# Patient Record
Sex: Male | Born: 1945 | Race: Black or African American | Hispanic: No | Smoking: Former smoker
Health system: Southern US, Community
[De-identification: ages and names within clinical notes are randomized; demographics above are authoritative.]

## PROBLEM LIST (undated history)

## (undated) ENCOUNTER — Emergency Department: Admission: EM | Payer: Medicare HMO | Source: Home / Self Care

## (undated) DIAGNOSIS — I509 Heart failure, unspecified: Secondary | ICD-10-CM

## (undated) DIAGNOSIS — J449 Chronic obstructive pulmonary disease, unspecified: Secondary | ICD-10-CM

## (undated) DIAGNOSIS — I2510 Atherosclerotic heart disease of native coronary artery without angina pectoris: Secondary | ICD-10-CM

## (undated) DIAGNOSIS — C801 Malignant (primary) neoplasm, unspecified: Secondary | ICD-10-CM

## (undated) DIAGNOSIS — C649 Malignant neoplasm of unspecified kidney, except renal pelvis: Secondary | ICD-10-CM

## (undated) DIAGNOSIS — C189 Malignant neoplasm of colon, unspecified: Secondary | ICD-10-CM

## (undated) DIAGNOSIS — J45909 Unspecified asthma, uncomplicated: Secondary | ICD-10-CM

## (undated) DIAGNOSIS — I428 Other cardiomyopathies: Secondary | ICD-10-CM

## (undated) DIAGNOSIS — I499 Cardiac arrhythmia, unspecified: Secondary | ICD-10-CM

## (undated) DIAGNOSIS — I4891 Unspecified atrial fibrillation: Secondary | ICD-10-CM

## (undated) DIAGNOSIS — I350 Nonrheumatic aortic (valve) stenosis: Secondary | ICD-10-CM

## (undated) DIAGNOSIS — Z8679 Personal history of other diseases of the circulatory system: Secondary | ICD-10-CM

## (undated) DIAGNOSIS — I10 Essential (primary) hypertension: Secondary | ICD-10-CM

## (undated) HISTORY — DX: Heart failure, unspecified: I50.9

## (undated) HISTORY — PX: KIDNEY SURGERY: SHX687

## (undated) HISTORY — DX: Cardiac arrhythmia, unspecified: I49.9

## (undated) HISTORY — PX: PARTIAL COLECTOMY: SHX5273

## (undated) HISTORY — PX: CARDIAC DEFIBRILLATOR PLACEMENT: SHX171

## (undated) HISTORY — PX: PACEMAKER IMPLANT: EP1218

---

## 2008-11-13 ENCOUNTER — Inpatient Hospital Stay: Payer: Self-pay | Admitting: Internal Medicine

## 2010-08-21 ENCOUNTER — Inpatient Hospital Stay: Payer: Self-pay | Admitting: Specialist

## 2010-11-10 ENCOUNTER — Emergency Department: Payer: Self-pay | Admitting: Emergency Medicine

## 2012-01-04 ENCOUNTER — Inpatient Hospital Stay: Payer: Self-pay | Admitting: *Deleted

## 2012-01-04 LAB — URINALYSIS, COMPLETE
Bacteria: NONE SEEN
Bilirubin,UR: NEGATIVE
Blood: NEGATIVE
Glucose,UR: NEGATIVE mg/dL (ref 0–75)
Granular Cast: 4
Nitrite: NEGATIVE
Protein: NEGATIVE
RBC,UR: 4 /HPF (ref 0–5)
Squamous Epithelial: 1
WBC UR: 5 /HPF (ref 0–5)

## 2012-01-04 LAB — CBC WITH DIFFERENTIAL/PLATELET
Basophil #: 0 10*3/uL (ref 0.0–0.1)
Basophil %: 0.4 %
Eosinophil #: 0.2 10*3/uL (ref 0.0–0.7)
HCT: 21.5 % — ABNORMAL LOW (ref 40.0–52.0)
HGB: 6.8 g/dL — ABNORMAL LOW (ref 13.0–18.0)
MCH: 27.3 pg (ref 26.0–34.0)
MCHC: 31.6 g/dL — ABNORMAL LOW (ref 32.0–36.0)
MCV: 86 fL (ref 80–100)
Monocyte #: 0.9 x10 3/mm (ref 0.2–1.0)
Monocyte %: 12.6 %
Neutrophil %: 62.9 %
Platelet: 199 10*3/uL (ref 150–440)
RDW: 19.2 % — ABNORMAL HIGH (ref 11.5–14.5)
WBC: 7.5 10*3/uL (ref 3.8–10.6)

## 2012-01-04 LAB — COMPREHENSIVE METABOLIC PANEL
Albumin: 2.8 g/dL — ABNORMAL LOW (ref 3.4–5.0)
Alkaline Phosphatase: 46 U/L — ABNORMAL LOW (ref 50–136)
Anion Gap: 9 (ref 7–16)
Bilirubin,Total: 0.2 mg/dL (ref 0.2–1.0)
Calcium, Total: 7.9 mg/dL — ABNORMAL LOW (ref 8.5–10.1)
Chloride: 110 mmol/L — ABNORMAL HIGH (ref 98–107)
Creatinine: 1.64 mg/dL — ABNORMAL HIGH (ref 0.60–1.30)
EGFR (African American): 49 — ABNORMAL LOW
EGFR (Non-African Amer.): 43 — ABNORMAL LOW
Osmolality: 298 (ref 275–301)
Potassium: 3.8 mmol/L (ref 3.5–5.1)
SGOT(AST): 26 U/L (ref 15–37)
SGPT (ALT): 23 U/L

## 2012-01-04 LAB — CK TOTAL AND CKMB (NOT AT ARMC)
CK, Total: 53 U/L (ref 35–232)
CK-MB: 1.1 ng/mL (ref 0.5–3.6)

## 2012-01-04 LAB — TROPONIN I: Troponin-I: 0.12 ng/mL — ABNORMAL HIGH

## 2012-01-04 LAB — LIPASE, BLOOD: Lipase: 53 U/L — ABNORMAL LOW (ref 73–393)

## 2012-01-05 LAB — CK TOTAL AND CKMB (NOT AT ARMC)
CK, Total: 255 U/L — ABNORMAL HIGH (ref 35–232)
CK-MB: 2.6 ng/mL (ref 0.5–3.6)

## 2012-01-05 LAB — BASIC METABOLIC PANEL
Anion Gap: 14 (ref 7–16)
BUN: 58 mg/dL — ABNORMAL HIGH (ref 7–18)
Calcium, Total: 8 mg/dL — ABNORMAL LOW (ref 8.5–10.1)
Co2: 22 mmol/L (ref 21–32)
Creatinine: 1.74 mg/dL — ABNORMAL HIGH (ref 0.60–1.30)
EGFR (African American): 46 — ABNORMAL LOW
Glucose: 116 mg/dL — ABNORMAL HIGH (ref 65–99)
Sodium: 146 mmol/L — ABNORMAL HIGH (ref 136–145)

## 2012-01-05 LAB — CBC WITH DIFFERENTIAL/PLATELET
Basophil #: 0 10*3/uL (ref 0.0–0.1)
Basophil %: 0.3 %
Eosinophil #: 0 10*3/uL (ref 0.0–0.7)
HCT: 22.7 % — ABNORMAL LOW (ref 40.0–52.0)
MCH: 26.6 pg (ref 26.0–34.0)
MCHC: 31.9 g/dL — ABNORMAL LOW (ref 32.0–36.0)
MCV: 83 fL (ref 80–100)
Monocyte %: 6.1 %
Neutrophil %: 83.6 %
RBC: 2.72 10*6/uL — ABNORMAL LOW (ref 4.40–5.90)
RDW: 17.8 % — ABNORMAL HIGH (ref 11.5–14.5)

## 2012-01-05 LAB — HEMOGLOBIN: HGB: 7.6 g/dL — ABNORMAL LOW (ref 13.0–18.0)

## 2012-01-05 LAB — LIPID PANEL
Cholesterol: 94 mg/dL (ref 0–200)
Ldl Cholesterol, Calc: 41 mg/dL (ref 0–100)
VLDL Cholesterol, Calc: 30 mg/dL (ref 5–40)

## 2012-01-05 LAB — TROPONIN I: Troponin-I: 0.12 ng/mL — ABNORMAL HIGH

## 2012-01-05 LAB — PROTIME-INR
INR: 2.3
Prothrombin Time: 39.6 secs — ABNORMAL HIGH (ref 11.5–14.7)

## 2012-01-05 LAB — HEMOGLOBIN A1C: Hemoglobin A1C: 5.9 % (ref 4.2–6.3)

## 2012-01-06 LAB — BASIC METABOLIC PANEL
Anion Gap: 10 (ref 7–16)
Calcium, Total: 7.8 mg/dL — ABNORMAL LOW (ref 8.5–10.1)
Chloride: 112 mmol/L — ABNORMAL HIGH (ref 98–107)
Co2: 25 mmol/L (ref 21–32)
Creatinine: 1.54 mg/dL — ABNORMAL HIGH (ref 0.60–1.30)
EGFR (Non-African Amer.): 46 — ABNORMAL LOW
Osmolality: 305 (ref 275–301)
Potassium: 4.1 mmol/L (ref 3.5–5.1)
Sodium: 147 mmol/L — ABNORMAL HIGH (ref 136–145)

## 2012-01-06 LAB — CBC WITH DIFFERENTIAL/PLATELET
Basophil %: 0.4 %
HCT: 24.2 % — ABNORMAL LOW (ref 40.0–52.0)
HGB: 7.8 g/dL — ABNORMAL LOW (ref 13.0–18.0)
Lymphocyte #: 1.4 10*3/uL (ref 1.0–3.6)
MCH: 27.6 pg (ref 26.0–34.0)
MCV: 85 fL (ref 80–100)
Monocyte #: 0.9 x10 3/mm (ref 0.2–1.0)
Monocyte %: 10.2 %
Neutrophil #: 6.4 10*3/uL (ref 1.4–6.5)
Neutrophil %: 72.6 %
RBC: 2.84 10*6/uL — ABNORMAL LOW (ref 4.40–5.90)
RDW: 16.6 % — ABNORMAL HIGH (ref 11.5–14.5)

## 2012-01-06 LAB — PROTIME-INR
INR: 4.2
Prothrombin Time: 39 secs — ABNORMAL HIGH (ref 11.5–14.7)

## 2012-01-07 LAB — PROTIME-INR
INR: 2.5
Prothrombin Time: 26.9 secs — ABNORMAL HIGH (ref 11.5–14.7)

## 2012-01-07 LAB — BASIC METABOLIC PANEL
BUN: 30 mg/dL — ABNORMAL HIGH (ref 7–18)
Co2: 27 mmol/L (ref 21–32)
Creatinine: 1.33 mg/dL — ABNORMAL HIGH (ref 0.60–1.30)
EGFR (African American): >60
Glucose: 114 mg/dL — ABNORMAL HIGH (ref 65–99)
Potassium: 3.6 mmol/L (ref 3.5–5.1)

## 2012-01-07 LAB — HEMOGLOBIN: HGB: 7.8 g/dL — ABNORMAL LOW (ref 13.0–18.0)

## 2012-01-08 LAB — CBC WITH DIFFERENTIAL/PLATELET
Basophil #: 0 10*3/uL (ref 0.0–0.1)
Basophil %: 0.4 %
Eosinophil #: 0.3 10*3/uL (ref 0.0–0.7)
Eosinophil %: 2.8 %
Lymphocyte %: 8.6 %
MCH: 28.3 pg (ref 26.0–34.0)
MCHC: 32.9 g/dL (ref 32.0–36.0)
Monocyte #: 1 x10 3/mm (ref 0.2–1.0)
Neutrophil #: 7.5 10*3/uL — ABNORMAL HIGH (ref 1.4–6.5)
Neutrophil %: 77.9 %
Platelet: 167 10*3/uL (ref 150–440)
RBC: 3.01 10*6/uL — ABNORMAL LOW (ref 4.40–5.90)
WBC: 9.7 10*3/uL (ref 3.8–10.6)

## 2012-01-08 LAB — PROTIME-INR: Prothrombin Time: 21.1 secs — ABNORMAL HIGH (ref 11.5–14.7)

## 2012-01-08 LAB — HEMOGLOBIN: HGB: 8.5 g/dL — ABNORMAL LOW (ref 13.0–18.0)

## 2012-01-09 LAB — BASIC METABOLIC PANEL
Anion Gap: 8 (ref 7–16)
Calcium, Total: 8.5 mg/dL (ref 8.5–10.1)
Chloride: 104 mmol/L (ref 98–107)
Co2: 29 mmol/L (ref 21–32)
Creatinine: 0.99 mg/dL (ref 0.60–1.30)
EGFR (African American): >60
EGFR (Non-African Amer.): >60
Glucose: 82 mg/dL (ref 65–99)
Osmolality: 279 (ref 275–301)
Sodium: 141 mmol/L (ref 136–145)

## 2012-01-09 LAB — HEMOGLOBIN: HGB: 8.3 g/dL — ABNORMAL LOW (ref 13.0–18.0)

## 2012-07-24 ENCOUNTER — Inpatient Hospital Stay: Payer: Self-pay | Admitting: Internal Medicine

## 2012-07-24 LAB — COMPREHENSIVE METABOLIC PANEL
Albumin: 3.3 g/dL — ABNORMAL LOW (ref 3.4–5.0)
Alkaline Phosphatase: 74 U/L (ref 50–136)
Anion Gap: 8 (ref 7–16)
BUN: 21 mg/dL — ABNORMAL HIGH (ref 7–18)
Calcium, Total: 8.4 mg/dL — ABNORMAL LOW (ref 8.5–10.1)
Chloride: 107 mmol/L (ref 98–107)
Creatinine: 1.11 mg/dL (ref 0.60–1.30)
EGFR (African American): >60
Glucose: 121 mg/dL — ABNORMAL HIGH (ref 65–99)
Osmolality: 291 (ref 275–301)
Potassium: 3.3 mmol/L — ABNORMAL LOW (ref 3.5–5.1)
SGOT(AST): 22 U/L (ref 15–37)
Sodium: 144 mmol/L (ref 136–145)
Total Protein: 7.5 g/dL (ref 6.4–8.2)

## 2012-07-24 LAB — CBC
HCT: 35.3 % — ABNORMAL LOW (ref 40.0–52.0)
HGB: 12.2 g/dL — ABNORMAL LOW (ref 13.0–18.0)
MCH: 30.7 pg (ref 26.0–34.0)
MCHC: 34.5 g/dL (ref 32.0–36.0)
MCV: 89 fL (ref 80–100)
Platelet: 201 10*3/uL (ref 150–440)
RBC: 3.98 10*6/uL — ABNORMAL LOW (ref 4.40–5.90)

## 2012-07-24 LAB — CK TOTAL AND CKMB (NOT AT ARMC): CK-MB: 1.9 ng/mL (ref 0.5–3.6)

## 2012-07-25 LAB — LIPID PANEL
HDL Cholesterol: 48 mg/dL (ref 40–60)
Ldl Cholesterol, Calc: 50 mg/dL (ref 0–100)
Triglycerides: 114 mg/dL (ref 0–200)
VLDL Cholesterol, Calc: 23 mg/dL (ref 5–40)

## 2012-07-25 LAB — CBC WITH DIFFERENTIAL/PLATELET
Basophil %: 0.9 %
Eosinophil #: 0.2 10*3/uL (ref 0.0–0.7)
Eosinophil %: 2.2 %
HCT: 34.3 % — ABNORMAL LOW (ref 40.0–52.0)
HGB: 11.5 g/dL — ABNORMAL LOW (ref 13.0–18.0)
Lymphocyte #: 1.8 10*3/uL (ref 1.0–3.6)
MCH: 29.9 pg (ref 26.0–34.0)
MCHC: 33.6 g/dL (ref 32.0–36.0)
Monocyte #: 0.8 x10 3/mm (ref 0.2–1.0)
Neutrophil #: 4.7 10*3/uL (ref 1.4–6.5)
Neutrophil %: 63.1 %
RBC: 3.85 10*6/uL — ABNORMAL LOW (ref 4.40–5.90)
RDW: 14.9 % — ABNORMAL HIGH (ref 11.5–14.5)

## 2012-07-25 LAB — BASIC METABOLIC PANEL
Calcium, Total: 8.5 mg/dL (ref 8.5–10.1)
Chloride: 101 mmol/L (ref 98–107)
Co2: 32 mmol/L (ref 21–32)
EGFR (African American): >60
EGFR (Non-African Amer.): 59 — ABNORMAL LOW
Glucose: 140 mg/dL — ABNORMAL HIGH (ref 65–99)
Potassium: 3.2 mmol/L — ABNORMAL LOW (ref 3.5–5.1)
Sodium: 141 mmol/L (ref 136–145)

## 2012-07-25 LAB — TROPONIN I: Troponin-I: 0.27 ng/mL — ABNORMAL HIGH

## 2013-01-19 ENCOUNTER — Inpatient Hospital Stay: Payer: Self-pay | Admitting: Internal Medicine

## 2013-01-19 LAB — COMPREHENSIVE METABOLIC PANEL
BUN: 20 mg/dL — ABNORMAL HIGH (ref 7–18)
Bilirubin,Total: 0.4 mg/dL (ref 0.2–1.0)
Chloride: 106 mmol/L (ref 98–107)
Creatinine: 1.36 mg/dL — ABNORMAL HIGH (ref 0.60–1.30)
EGFR (African American): >60
EGFR (Non-African Amer.): 53 — ABNORMAL LOW
Glucose: 93 mg/dL (ref 65–99)
Potassium: 3.7 mmol/L (ref 3.5–5.1)
SGOT(AST): 19 U/L (ref 15–37)
SGPT (ALT): 19 U/L (ref 12–78)
Sodium: 142 mmol/L (ref 136–145)

## 2013-01-19 LAB — APTT: Activated PTT: 28.7 secs (ref 23.6–35.9)

## 2013-01-19 LAB — CBC
HCT: 33 % — ABNORMAL LOW (ref 40.0–52.0)
HGB: 11.2 g/dL — ABNORMAL LOW (ref 13.0–18.0)
MCH: 30.1 pg (ref 26.0–34.0)
MCHC: 33.9 g/dL (ref 32.0–36.0)
RBC: 3.72 10*6/uL — ABNORMAL LOW (ref 4.40–5.90)
RDW: 14.3 % (ref 11.5–14.5)
WBC: 6.1 10*3/uL (ref 3.8–10.6)

## 2013-01-19 LAB — URINALYSIS, COMPLETE
Blood: NEGATIVE
Glucose,UR: NEGATIVE mg/dL (ref 0–75)
Ketone: NEGATIVE
Nitrite: NEGATIVE
Ph: 6 (ref 4.5–8.0)
Protein: 25
RBC,UR: 1 /HPF (ref 0–5)
WBC UR: 9 /HPF (ref 0–5)

## 2013-01-19 LAB — PRO B NATRIURETIC PEPTIDE: B-Type Natriuretic Peptide: 660 pg/mL — ABNORMAL HIGH (ref 0–125)

## 2013-01-19 LAB — CK TOTAL AND CKMB (NOT AT ARMC): CK, Total: 139 U/L (ref 35–232)

## 2013-01-20 LAB — CK TOTAL AND CKMB (NOT AT ARMC)
CK, Total: 125 U/L (ref 35–232)
CK-MB: 2.3 ng/mL (ref 0.5–3.6)

## 2013-01-20 LAB — TROPONIN I: Troponin-I: 0.23 ng/mL — ABNORMAL HIGH

## 2013-01-20 LAB — APTT
Activated PTT: 55.8 secs — ABNORMAL HIGH (ref 23.6–35.9)
Activated PTT: 66.8 secs — ABNORMAL HIGH (ref 23.6–35.9)
Activated PTT: 33 secs (ref 23.6–35.9)

## 2013-04-15 ENCOUNTER — Observation Stay: Payer: Self-pay | Admitting: Student

## 2013-04-15 LAB — CBC
MCH: 30.5 pg (ref 26.0–34.0)
MCHC: 33.9 g/dL (ref 32.0–36.0)
Platelet: 160 10*3/uL (ref 150–440)
RBC: 3.65 10*6/uL — ABNORMAL LOW (ref 4.40–5.90)
RDW: 14 % (ref 11.5–14.5)
WBC: 6.8 10*3/uL (ref 3.8–10.6)

## 2013-04-15 LAB — BASIC METABOLIC PANEL
Anion Gap: 7 (ref 7–16)
BUN: 15 mg/dL (ref 7–18)
Co2: 30 mmol/L (ref 21–32)
EGFR (African American): >60
Glucose: 135 mg/dL — ABNORMAL HIGH (ref 65–99)
Osmolality: 288 (ref 275–301)
Potassium: 3.6 mmol/L (ref 3.5–5.1)

## 2013-04-15 LAB — TROPONIN I
Troponin-I: 0.16 ng/mL — ABNORMAL HIGH
Troponin-I: 0.15 ng/mL — ABNORMAL HIGH
Troponin-I: 0.14 ng/mL — ABNORMAL HIGH

## 2013-04-15 LAB — MAGNESIUM: Magnesium: 1.6 mg/dL — ABNORMAL LOW

## 2013-04-15 LAB — CK TOTAL AND CKMB (NOT AT ARMC)
CK, Total: 118 U/L (ref 35–232)
CK, Total: 104 U/L (ref 35–232)
CK, Total: 105 U/L (ref 35–232)
CK-MB: 1.8 ng/mL (ref 0.5–3.6)
CK-MB: 1.8 ng/mL (ref 0.5–3.6)

## 2013-04-15 LAB — PROTIME-INR: Prothrombin Time: 16.7 secs — ABNORMAL HIGH (ref 11.5–14.7)

## 2013-04-16 LAB — CBC WITH DIFFERENTIAL/PLATELET
Basophil #: 0 10*3/uL (ref 0.0–0.1)
Basophil %: 0.6 %
Eosinophil #: 0.2 10*3/uL (ref 0.0–0.7)
Eosinophil %: 3.3 %
Lymphocyte #: 1.2 10*3/uL (ref 1.0–3.6)
Lymphocyte %: 20.3 %
MCH: 31.4 pg (ref 26.0–34.0)
MCHC: 35.1 g/dL (ref 32.0–36.0)
Monocyte %: 12.4 %
Neutrophil %: 63.4 %
RDW: 14.2 % (ref 11.5–14.5)

## 2013-04-16 LAB — BASIC METABOLIC PANEL
Anion Gap: 4 — ABNORMAL LOW (ref 7–16)
BUN: 15 mg/dL (ref 7–18)
Calcium, Total: 8.9 mg/dL (ref 8.5–10.1)
Chloride: 104 mmol/L (ref 98–107)
Co2: 31 mmol/L (ref 21–32)
EGFR (Non-African Amer.): >60
Glucose: 85 mg/dL (ref 65–99)
Osmolality: 278 (ref 275–301)
Sodium: 139 mmol/L (ref 136–145)

## 2013-04-16 LAB — PROTIME-INR
INR: 1.4
Prothrombin Time: 17.4 secs — ABNORMAL HIGH (ref 11.5–14.7)

## 2013-04-16 LAB — MAGNESIUM: Magnesium: 1.7 mg/dL — ABNORMAL LOW

## 2014-04-28 ENCOUNTER — Emergency Department: Payer: Self-pay | Admitting: Emergency Medicine

## 2014-05-24 ENCOUNTER — Emergency Department: Payer: Self-pay | Admitting: Emergency Medicine

## 2014-06-27 ENCOUNTER — Ambulatory Visit: Payer: Self-pay | Admitting: Family Medicine

## 2014-10-19 ENCOUNTER — Inpatient Hospital Stay: Payer: Self-pay | Admitting: Internal Medicine

## 2014-10-19 LAB — CK-MB
CK-MB: 1.4 ng/mL (ref 0.5–3.6)
CK-MB: 1.4 ng/mL (ref 0.5–3.6)
CK-MB: 1.5 ng/mL (ref 0.5–3.6)

## 2014-10-19 LAB — CBC
HCT: 34.2 % — ABNORMAL LOW (ref 40.0–52.0)
HGB: 11.1 g/dL — ABNORMAL LOW (ref 13.0–18.0)
MCH: 29.9 pg (ref 26.0–34.0)
MCHC: 32.5 g/dL (ref 32.0–36.0)
MCV: 92 fL (ref 80–100)
PLATELETS: 210 10*3/uL (ref 150–440)
RBC: 3.72 10*6/uL — ABNORMAL LOW (ref 4.40–5.90)
RDW: 15.6 % — ABNORMAL HIGH (ref 11.5–14.5)
WBC: 10.2 10*3/uL (ref 3.8–10.6)

## 2014-10-19 LAB — BASIC METABOLIC PANEL
ANION GAP: 6 — AB (ref 7–16)
BUN: 16 mg/dL (ref 7–18)
CHLORIDE: 108 mmol/L — AB (ref 98–107)
Calcium, Total: 8.8 mg/dL (ref 8.5–10.1)
Co2: 33 mmol/L — ABNORMAL HIGH (ref 21–32)
Creatinine: 1.15 mg/dL (ref 0.60–1.30)
EGFR (African American): >60
EGFR (Non-African Amer.): >60
Glucose: 92 mg/dL (ref 65–99)
Osmolality: 293 (ref 275–301)
POTASSIUM: 3.4 mmol/L — AB (ref 3.5–5.1)
Sodium: 147 mmol/L — ABNORMAL HIGH (ref 136–145)

## 2014-10-19 LAB — APTT: Activated PTT: 52.6 secs — ABNORMAL HIGH (ref 23.6–35.9)

## 2014-10-19 LAB — PROTIME-INR
INR: 3.5
Prothrombin Time: 33.9 secs — ABNORMAL HIGH (ref 11.5–14.7)

## 2014-10-19 LAB — TROPONIN I
TROPONIN-I: 0.39 ng/mL — AB
TROPONIN-I: 0.38 ng/mL — AB
TROPONIN-I: 0.41 ng/mL — AB

## 2014-10-19 LAB — PRO B NATRIURETIC PEPTIDE: B-Type Natriuretic Peptide: 3218 pg/mL — ABNORMAL HIGH (ref 0–125)

## 2014-10-20 LAB — CBC WITH DIFFERENTIAL/PLATELET
BASOS ABS: 0.1 10*3/uL (ref 0.0–0.1)
Basophil %: 0.5 %
EOS ABS: 0 10*3/uL (ref 0.0–0.7)
Eosinophil %: 0 %
HCT: 34.8 % — AB (ref 40.0–52.0)
HGB: 11 g/dL — ABNORMAL LOW (ref 13.0–18.0)
LYMPHS ABS: 0.6 10*3/uL — AB (ref 1.0–3.6)
Lymphocyte %: 5.6 %
MCH: 29.3 pg (ref 26.0–34.0)
MCHC: 31.8 g/dL — ABNORMAL LOW (ref 32.0–36.0)
MCV: 92 fL (ref 80–100)
MONO ABS: 0.1 x10 3/mm — AB (ref 0.2–1.0)
MONOS PCT: 0.9 %
NEUTROS ABS: 9.3 10*3/uL — AB (ref 1.4–6.5)
NEUTROS PCT: 93 %
Platelet: 216 10*3/uL (ref 150–440)
RBC: 3.78 10*6/uL — ABNORMAL LOW (ref 4.40–5.90)
RDW: 15.4 % — AB (ref 11.5–14.5)
WBC: 10 10*3/uL (ref 3.8–10.6)

## 2014-10-20 LAB — PROTIME-INR
INR: 3.2
PROTHROMBIN TIME: 31.5 s — AB (ref 11.5–14.7)

## 2014-10-20 LAB — BASIC METABOLIC PANEL
Anion Gap: 7 (ref 7–16)
BUN: 20 mg/dL — AB (ref 7–18)
Calcium, Total: 9.2 mg/dL (ref 8.5–10.1)
Chloride: 103 mmol/L (ref 98–107)
Co2: 33 mmol/L — ABNORMAL HIGH (ref 21–32)
Creatinine: 1.07 mg/dL (ref 0.60–1.30)
EGFR (African American): >60
GLUCOSE: 136 mg/dL — AB (ref 65–99)
Osmolality: 290 (ref 275–301)
POTASSIUM: 3.6 mmol/L (ref 3.5–5.1)
SODIUM: 143 mmol/L (ref 136–145)

## 2014-10-21 LAB — PROTIME-INR
INR: 3.2
PROTHROMBIN TIME: 31.8 s — AB (ref 11.5–14.7)

## 2015-01-11 NOTE — Consult Note (Signed)
Brief Consult Note: Diagnosis: NSTEMI in patient with known CAD s/p PCI and stening, cardiomyopathy with AICD.   Comments: Patient has h/o CHF (systolic and diastolic dysfx) with cardiomyopathy s/p AICD, HTN, hyperlipidemia, CAD s/p PCI, h/o prior MI, paroxysmal afib and had episode of CP, SOB, diaphoresis, and pre-syncope with TNI 0.29. Heparin gtt not started due to h/o GIB and on ASA, Plavix, statin, BB, Lasix. Will plan for  cardiac cath today and will need to get records from [facility]ologist at [facility].  Electronic Signatures: Angelica Ran (MD)   (Signed [phone] 08:15)  Co-Signer: Brief Consult Note Gabriel Rung A (PA-C)   (Signed 31-Oct-13 15:19)  Authored: Brief Consult Note  Last Updated: 01-Nov-13 08:15 by Angelica Ran (MD)

## 2015-01-11 NOTE — Discharge Summary (Signed)
PATIENT NAME:  Darius Taylor, Darius Taylor MR#:  [id number] DATE OF BIRTH:  09/10/1946  DATE OF ADMISSION:  07/24/2012 DATE OF DISCHARGE:  07/25/2012  PRIMARY CARE PHYSICIAN:   Nonlocal. CONSULTING PHYSICIAN: Dr. Humphrey Rolls.   DISCHARGE DIAGNOSES:  1. Unstable angina.  2. Chronic congestive heart failure with diastolic dysfunction.  3. Hypertension.   PROCEDURE: Cardiac catheterization.    CONDITION: Stable.   CODE STATUS: FULL CODE.  MEDICATIONS:  1. Lasix 20 mg p.o. daily.  2. Lopressor 50 mg p.o. 1.5 tablets b.i.d.  3. Aspirin 81 mg p.o. daily.  4. Magnesium oxide 400 mg p.o. b.i.d.  5. Atorvastatin 40 mg p.o. at bedtime.  6. Proventil HFA 90 mcg inhalation 2 puffs inhaled 4 to 6 hours p.r.n.  7. Symbicort 80 mcg/4.5 mcg inhalation 2 puffs b.i.d.  8. Ferrous sulfate 45 mg 1 tablet p.o. daily.  9. Sevoflurane 20 mg p.o. daily.  10. Amiodarone 400 mg p.o. 0.5 tablets once daily. 11. Vitamin D3 1000 international units p.o. capsule one cap daily.  12. Lisinopril 10 mg p.o. 2 tablets once daily.  13. Albuterol 2.5 mg/3 mL inhalation, 3 mL inhaled every 4 to 6 hours p.r.n. for dyspnea.  14. Nitrostat 0.4 mg sublingual tablets 1 tablet every five minutes p.r.n. for chest pain.  15. New medication:  Plavix 75 mg p.o. daily.   DIET: Low sodium diet.   ACTIVITY: As tolerated.   FOLLOW-UP CARE: Follow-up with Dr. Humphrey Rolls this Monday and follow-up with PCP within 1 to 2 weeks.   REASON FOR ADMISSION: Chest pain.   HOSPITAL COURSE: The patient is a 69 year old Caucasian male with a history of hypertension, hyperlipidemia, cardiomyopathy, congestive heart failure with diastolic dysfunction, atrial fib status post defibrillator, but off Coumadin due to gastrointestinal bleeding, who presented to the ED with chest pain, which is in the substernal area, 4 out of 10, intermittent, no radiation. For detailed history and physical examination, please refer to the admission note dictated by me. On admission date,  the patient's troponin level was at 0.29. WBC 8.7, hemoglobin 12.2, potassium 3.1. The patient's EKG showed atrial fib with competing junctional pacemaker, prolonged QT. After admission, the patient has been treated with aspirin 325 mg p.o. daily. Plavix was added. In addition, we gave statin, lisinopril, Lopressor and Lasix. The patient had previous gastrointestinal bleeding in April. We did not take start heparin drip or Lovenox. The patient's troponin level has been stable around 0.29, 0.27. We requested a cardiology consult. Dr. Humphrey Rolls evaluated the patient and did a cardiac cath this morning which revealed stent in the mid LAD having mild restenosis. Dr. Humphrey Rolls suggested aggressive medical therapy as LAD looks okay and LVEF was normal. He suggested the patient follow-up him on Monday in his office. The patient has no complaints after cardiac catheterization. Vital signs are stable. He was discharged to home today. I discussed the patient's discharge plan with the patient, Dr. Humphrey Rolls and the case manager.   TIME SPENT: About 36 minutes.   ____________________________ Demetrios Loll, MD qc:ap D: 07/25/2012 16:15:57 ET T: 07/26/2012 11:36:43 ET JOB#: [id number]  cc: Demetrios Loll, MD, <Dictator> Dionisio David, MD Demetrios Loll MD ELECTRONICALLY SIGNED 07/30/2012 16:55

## 2015-01-11 NOTE — H&P (Signed)
PATIENT NAME:  Darius Taylor, VAIL MR#:  [id number] DATE OF BIRTH:  10-11-1945  DATE OF ADMISSION:  07/24/2012  PRIMARY CARE PHYSICIAN:  None local.  REFERRING PHYSICIAN: Ferman Hamming, MD  CHIEF COMPLAINT: Chest pain today.   HISTORY OF PRESENT ILLNESS: The patient is a 69 year old Caucasian male with history of hypertension, hyperlipidemia, cardiomyopathy, congestive heart failure with diastolic dysfunction, and atrial fibrillation status post defibrillator but off Coumadin due to GI bleeding who presented to the ED with chest pain today. The patient started to have chest pain at about 9:00 p.m. when he went to the bathroom, which was substernal, 4 out of 10, intermittent and aching with no radiation. The patient also complains of cough, sputum, shortness of breath, and palpitations. He uses two pillows to sleep as he has history of CHF, but he denies any weight gain or leg edema, no orthopnea, and no nocturnal dyspnea. The patient denies any fever or chills and no dysuria or hematuria. He mentioned that he had pneumonia recently.   PAST MEDICAL HISTORY:  1. Hypertension. 2. Hyperlipidemia. 3. Chronic kidney disease. 4. Congestive heart failure, diastolic dysfunction. 5. Cardiomyopathy.  6. Atrial fibrillation.  7. Renal carcinoma status post left nephrectomy.  8. Gout.  9. Chronic kidney disease stage III.   PAST SURGICAL HISTORY:  1. Left nephrectomy for renal carcinoma.  2. Defibrillator placement.   SOCIAL HISTORY: No smoking or drinking or illicit drugs.   FAMILY HISTORY: Mother with aneurysm.   ALLERGIES: No known drug allergies.   HOME MEDICATIONS:  1. Albuterol 2.5/3 mL every 4 to 6 hours p.r.n. 2. Amiodarone 400 mg tablet 1/2 tablet once daily.  3. Aspirin 81 mg p.o. daily.  4. Atorvastatin 1 tab at bedtime.  5. Citalopram 20 mg p.o. daily.  6. Ferrous sulfate (as elemental iron) 45 mg tablets 1 tablet a day.  7. Lasix 20 mg p.o. daily.  8. Lisinopril 10 mg p.o. 2  tablets p.o. daily.  9. Magnesium oxide 400 mg p.o. twice a day. 10. Lopressor 50 mg p.o. 1-1/2 tablets twice a day.  11. Nitrostat 0.4 mg sublingual every five minutes p.r.n. for chest pain.  12. Proventil HFA 90 mcg inhalation 2 puffs every 4 to 6 hours p.r.n.  13. Symbicort 80 mcg/4.5 mcg inhalation 2 tabs twice a day.  14. Vitamin D3 1000 international unit capsule one cap once daily.   REVIEW OF SYSTEMS: CONSTITUTIONAL: The patient denies any fever or chills. No headache or dizziness. No weakness. EYES: No double vision or blurred vision. ENT: No postnasal drip, slurred speech, or dysphagia. No epistaxis. CARDIOVASCULAR: Positive for chest pain and palpitations but no orthopnea, nocturnal dyspnea, or leg edema. PULMONARY: Positive for cough, sputum, shortness of breath, or hemoptysis. GASTROINTESTINAL: No abdominal pain, nausea, vomiting, or diarrhea. No melena or bloody stool. GENITOURINARY: No dysuria, hematuria, or incontinence. ENDOCRINE: No heat or cold intolerance. No polyuria or polydipsia. SKIN: No rash or jaundice. MUSCULOSKELETAL: No joint pain or edema. NEUROLOGY: No syncope, loss of consciousness, or seizure. HEMATOLOGY: No easy bruising or bleeding.   PHYSICAL EXAMINATION:   VITAL SIGNS: Temperature 99.1, blood pressure 108/73, pulse 62, respirations 18, and oxygen saturation 97% on room air.   GENERAL: The patient is alert, awake, and oriented, in no acute distress.   HEENT: Pupils are round, equal, and reactive to light and accommodation. Moist oral mucosa. Clear oropharynx.   NECK: Supple. No JVD or carotid bruits. No lymphadenopathy. No thyromegaly.   CARDIOVASCULAR: S1 and S2 regular rate  and rhythm. No murmurs or gallops.   PULMONARY: Bilateral air entry. No wheezing or rales. No use of accessory muscles to breathe.   ABDOMEN: Soft, obese. No distention or tenderness. No organomegaly. Bowel sounds present.   EXTREMITIES: No edema, clubbing, or cyanosis. No calf  tenderness. Strong bilateral pedal pulses.   SKIN: No rash or jaundice.   NEURO: Alert and oriented x3. No focal deficit. Power 5 out of 5. Sensation intact. Deep tendon reflexes 2+.   LABORATORY, DIAGNOSTIC AND RADIOLOGIC DATA: WBC 8.7, hemoglobin 12.2, and platelets 201. Troponin 0.29. CK 49 and CK-MB 1.9. Glucose 121, BUN 21, creatinine 1.11, sodium 144, potassium 3.1, bicarbonate 29, and chloride 107.  Chest x-ray: Stable cardiomyopathy. No acute cardiopulmonary disease.   EKG: Atrial fibrillation with competing junctional pacemaker, left axis deviation, prolonged QT.  IMPRESSION:  1. Chest pain, non-ST-elevation myocardial infarction.  2. Congestive heart failure with diastolic dysfunction and cardiomyopathy, stable.  3. Atrial fibrillation.  4. Hyperlipidemia.  5. Hypertension, controlled.  6. Chronic obstructive pulmonary disease. 7. Chronic kidney disease.   PLAN OF TREATMENT: The patient will be admitted to telemetry floor. We will continue O2 by nasal cannula. We will increase aspirin to 325 mg p.o. daily and add Plavix. Also we will continue statin, lisinopril, Lopressor, and Lasix. We will follow up a troponin level and lipid panel and follow up with cardiologist, Dr. Humphrey Rolls, for further recommendations. Since the patient has a history of GI bleeding in April, we will not start heparin drip or Lovenox at this time. For chronic obstructive pulmonary disease, we will give home medication and nebulizer p.r.n. GI and DVT prophylaxis.   I discussed the patient's situation and the plan of treatment with the patient.   TIME SPENT: About 60 minutes. ____________________________ Demetrios Loll, MD qc:slb D: 07/24/2012 13:11:20 ET T: 07/24/2012 13:27:12 ET JOB#: [id number]  cc: Demetrios Loll, MD, <Dictator> Demetrios Loll MD ELECTRONICALLY SIGNED 07/24/2012 16:37

## 2015-01-11 NOTE — Consult Note (Signed)
PATIENT NAME:  Darius Taylor, Darius Taylor MR#:  [id number] DATE OF BIRTH:  1946-03-15  DATE OF CONSULTATION:  07/24/2012  REFERRING PHYSICIAN:  Dr. Bridgett Larsson CONSULTING PHYSICIAN:  Merla Riches, PA-C  PRIMARY CARE PHYSICIAN:  [place] PRIMARY CARDIOLOGIST: [facility] (patient unsure of what the physician's name is)   REASON FOR CONSULTATION: Chest pain, likely non-ST elevation myocardial infarction.   HISTORY OF PRESENT ILLNESS: This is a 69 year old male with a past medical history significant for hypertension, hyperlipidemia, chronic kidney disease, congestive heart failure, asthma, and atrial fibrillation. He is normally followed by [facility] cardiology. He was at [facility] this morning. He had a fast heart rate, palpitations with dizziness, lightheadedness, chest pressure, blurry vision, and nausea. The patient's pain persisted for several minutes and he became diaphoretic and EMS was summoned. The patient does not have any chest pain at this time. He had a myocardial infarction several years ago. He has shortness of breath and wheezing as he does have a history of asthma, but denies any productive cough or edema. He does have two-pillow orthopnea. In the past he thinks he has had some exacerbation of his congestive heart failure as he has had edema of the legs.   The patient states that he has had placement of a new automatic implantable cardiac defibrillator ([employer]) less than one year ago at [facility] and believes he also had a catheterization with PCI (percutaneous coronary intervention) with stent placement. He is unsure if he has had any recent echocardiogram.   PAST MEDICAL HISTORY:  1. Hypertension.  2. Hyperlipidemia.  3. Coronary artery disease.  4. History of prior myocardial infarction (? 10 years ago).  5. Congestive heart failure with a history of cardiomyopathy, status post automatic implantable cardiac defibrillator placement, also diastolic dysfunction.   6. Paroxysmal atrial fibrillation.  7. Gout.  8. Chronic kidney disease stage III.  9. Renal carcinoma.  10. Recent pneumonia, treated with antibiotics and steroids and finished medicines last week.  11. History of acute GI bleeding.   PAST SURGICAL HISTORY:  1. Left nephrectomy for renal carcinoma.  2. Automatic implantable cardiac defibrillator placement and replacement (done at [facility]).   ALLERGIES: No known drug allergies.   HOME MEDICATIONS:  1. Albuterol 2.5-3 mL every 4 to 6 hours as needed.  2. Amiodarone 400-mg tablets 1/2 tablet p.o. daily.  3. Aspirin 81 mg p.o. daily.  4. Atorvastatin (unknown dose) 1 tablet p.o. at bedtime.  5. Citalopram 20 mg p.o. daily.  6. Ferrous sulfate 45-mg tablets, 1 tablet daily.  7. Lasix 20 mg p.o. daily.  8. Lisinopril 10 mg, 2 tablets p.o. daily.  9. Magnesium oxide 400 mg p.o. twice daily.  10. Lopressor 50 mg p.o. 1-1/2 tablets b.i.d.  11. Nitrostat 0.4 mg sublingually every five minutes as needed for chest pain.  12. Proventil HFA 90 mcg inhaled, 2 puffs every 4 to 6 hours as needed.  13. Symbicort 80 mcg/ 4.5 mcg inhalation twice a day. 14. Vitamin D3 1000 international units, one cap daily.   SOCIAL HISTORY: The patient is married. He quit smoking many years ago and was never a "heavy smoker", denies alcohol or illicit drug use. Does not use any caffeine products.   FAMILY HISTORY: Mother with CVA and aneurysm, son with multiple valve replacements, congestive heart failure, and died of cardiac complications at age 33.   REVIEW OF SYSTEMS: The patient complains of blurry vision, presyncope, shortness of breath with wheezing, two-pillow orthopnea,  and chest pain. See the history of present illness.   PHYSICAL EXAMINATION:  GENERAL: This is a pleasant male who is lying comfortably in bed. He is not in any acute distress at this time.   VITAL SIGNS: Temperature 97.3 degrees Fahrenheit, heart rate 63, respiratory rate 18, blood pressure  153/89, and oxygen saturation 98% on room air.   HEAD: Head atraumatic, normocephalic.   EYES: Pupils are round and equal. Conjunctivae pale pink. There is no scleral icterus.   EARS AND NOSE:  Normal to external inspection.   MOUTH: Moist mucous membranes.   NECK: Supple. Trachea is midline. No carotid bruits appreciated.   PULMONARY: The patient has bilateral wheezing scattered throughout the lung fields. No accessory muscles are used to breathe.   CARDIOVASCULAR: Regular rate and rhythm. No murmurs, rubs, or gallops can be appreciated.   ABDOMEN: Nondistended. Bowel sounds positive, nontender to palpation.   EXTREMITIES: No cyanosis, clubbing, or edema. The patient has TED hose.   ANCILLARY DATA: EKG on admission: Atrial fibrillation with competing junctional pacemaker, left axis deviation and prolonged QTc.  Chest x-ray: Stable cardiomegaly, no acute cardiopulmonary disease.   LABORATORY DATA: Glucose 121, BUN 21, creatinine 1.11, sodium 144, potassium 3.3, chloride 107, CO2 29, estimated GFR is greater than 60, calcium is 8.4, total cholesterol is pending. Albumin 3.3, bilirubin 0.5, AST 22, ALT 24, total CK 49, CK-MB is 1.9. Troponin I 0.29. White blood cell count 8.7.  Red blood cell count 3.9. Hemoglobin is 12.2, hematocrit 35.3, platelet count 201,000. PT 21.1. INR 1.8.   ASSESSMENT/PLAN:  1. Chest pain in patient with known coronary artery disease, likely non-ST elevation myocardial infarction. The patient had an episode of palpitations, chest pain, diaphoresis, and nausea. He has previous history of coronary artery disease and thinks he may have had PCI with intervention (stent) at [facility]. He is chest pain free at this time. Heparin drip has not been started  since he has a history of GI bleed. He is currently on aspirin 325, Plavix, statin, beta blocker, and Lasix. We will proceed with cardiac catheterization tomorrow as the patient has eaten today, and we will need to  obtain records from his cardiologist at [facility]. 2. Atrial fibrillation. The patient said he has paroxysmal atrial fibrillation, however is in atrial fibrillation today with a controlled rate. He has not been on anticoagulation due to his history of GI bleeding. He is on rate control treatment.  3. Congestive heart failure with history of cardiomyopathy. Per the patient's records, he has history of both systolic and diastolic heart failure, at one point had an ejection fraction of 20% back in 2004 at [facility]. He is status post automatic implantable cardiac defibrillator placement less than one year ago.   Thank you very much for this consultation and allowing [place] to participate in this patient's care. We will continue to follow with you.   ____________________________ Merla Riches, PA-C mam:bjt D: 07/24/2012 16:06:40 ET T: 07/24/2012 17:04:21 ET JOB#: [id number]  cc: Merla Riches, PA-C, <Dictator> Malone PA ELECTRONICALLY SIGNED 07/29/2012 16:22

## 2015-01-14 NOTE — Consult Note (Signed)
   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 Neoma Laming. He had his aicd recently evaluated by his cardiologist and told it was funcitonaing well. He was admittred with complaints of palpitations similar to his rhythm that sometimes causeis his defibrillator to fire. He denies any firing of his defib. He has a trivial elevation of his troponin . No chest pain. No arrythmia noted on his telemtry. Reports comjpliance with his medications.   Physical Exam:  GEN obese   HEENT PERRL, hearing intact to voice   NECK supple   RESP normal resp effort  clear BS  no use of accessory muscles   CARD Irregular rate and rhythm   ABD no hernia  normal BS   LYMPH negative neck   EXTR negative cyanosis/clubbing, negative edema   SKIN normal to palpation   NEURO cranial nerves intact, motor/sensory function intact   PSYCH A+O to time, place, person   Review of Systems:  Subjective/Chief Complaint palpitations   General: Fatigue  Weakness   Skin: No Complaints   ENT: No Complaints   Eyes: No Complaints   Neck: No Complaints   Respiratory: Short of breath   Cardiovascular: Palpitations   Gastrointestinal: No Complaints   Genitourinary: No Complaints   Vascular: No Complaints   Musculoskeletal: No Complaints   Neurologic: No Complaints   Hematologic: No Complaints   Endocrine: No Complaints   Psychiatric: No Complaints   Review of Systems: All other systems were reviewed and found to be negative   Medications/Allergies Reviewed Medications/Allergies reviewed   EKG:  Interpretation a pace v sense    No Known Allergies:    Impression Pt with histoyr of cardiomyopathy, which is followed in [facility] with aicd in place. Admitted with palpitaitons. Interogation of his device shows that it is functional and predominately a pacing with v sensing. Occasional v pace. No  defib events. Occasional afib with rvr noted on device. Has ruled out for an mi and is stable on amiodarone and metoprolol. INR is 1.6   Plan 1. Increase metoprolol to 100 bid 2. COntinue with amiodarone at 200 daily 3. Low sodium diet 4. Weight loss 5. AMbulate today on current meds and consider discharge if stable 6. FOllow up with Dr. Humphrey Rolls and [facility] cardiology 7. INR goal of 2-3   Electronic Signatures: Teodoro Spray (MD)  (Signed 23-Jul-14 15:26)  Authored: General Aspect/Present Illness, History and Physical Exam, Review of System, EKG , Allergies, Impression/Plan   Last Updated: 23-Jul-14 15:26 by Teodoro Spray (MD)

## 2015-01-14 NOTE — Consult Note (Signed)
PATIENT NAME:  Darius Taylor, Darius Taylor MR#:  [id number] DATE OF BIRTH:  02/03/1946  DATE OF CONSULTATION:  01/20/2013  CONSULTING PHYSICIAN:  Dionisio David, MD  HISTORY OF PRESENT ILLNESS: This is a 69 year old African-American male with a past medical history of coronary artery disease, who came into the Emergency Room with chest pain. The chest pain was described as pressure-type associated with shortness of breath. He has ICD which, however, did not fire. It lasted only 10 to 15 minutes, but he has elevated troponin, and thus I was asked to evaluate the patient. The patient has also a past medical history of COPD, chronic end-stage renal disease, left nephrectomy due to renal carcinoma, has chronic atrial fibrillation.   ALLERGIES: None.   SOCIAL HISTORY: Smoked for 20 to 30 years, quit 5 years ago. No alcohol abuse.   FAMILY HISTORY: Positive for coronary artery disease.   PHYSICAL EXAMINATION:  GENERAL: He is alert, oriented x3, in no acute distress right now.  VITALS: Stable.  NECK: No JVD.  LUNGS: Clear.  HEART: Regular rate and rhythm. Normal S1, S2.  ABDOMEN: Soft, nontender, positive bowel sounds.  EXTREMITIES: No pedal edema.  NEUROLOGIC: The patient appears to be intact.   DIAGNOSTIC STUDIES: Troponin is 0.22. Creatinine is 1.36. EKG shows normal sinus rhythm, nonspecific ST-T changes.   ASSESSMENT AND PLAN: Non-ST segment elevation myocardial infarction.   PLAN: To do left heart catheterization with a history of coronary artery disease. Thank you very much for referral.  ____________________________ Dionisio David, MD sak:OSi D: 01/20/2013 09:12:05 ET T: 01/20/2013 09:41:23 ET JOB#: [id number]  cc: Dionisio David, MD, <Dictator> Dionisio David MD ELECTRONICALLY SIGNED 01/23/2013 8:58

## 2015-01-14 NOTE — H&P (Signed)
PATIENT NAME:  Darius Taylor, Darius Taylor MR#:  [id number] DATE OF BIRTH:  12/10/1945  DATE OF ADMISSION:  04/15/2013  REFERRING PHYSICIAN:  Dr. Marta Antu.   PRIMARY CARE PHYSICIAN:  Located at [facility].   PRIMARY CARDIOLOGIST:  Dr. Neoma Laming, following with him here and following with [facility] cardiology for his AICD.   CHIEF COMPLAINT:  Episode of palpitations and weakness.   HISTORY OF PRESENT ILLNESS:  This is a 69 year old male with known past medical history of cardiomyopathy status post AICD placement before eight years, congestive heart failure with last ejection fraction 55% in April of this year, history of coronary artery disease with last cath done in 2013 by Dr. Neoma Laming, presents with complaints of episode of palpitations, the patient reports he was walking to the kitchen this evening, where he was in front of the fridge where he felt a funny feeling in his chest where he reports he felt some palpitations where he almost felt his AICD would be firing, but it did not, where he reportedly had an episode of just feeling generally weak which he called EMS because of that, by the time they presented this episode was already resolved.  He denies any chest pain or tightness.  He denies any shortness of breath other than his baseline where he does have two pillow orthopnea baseline, denies any cough, any fever, any chills, any diarrhea, constipation, dizziness or lightheadedness, in ED the patient had EKG done which did show him having atrial paced rhythm with prolonged AV conduction, once compared to previous EKG there are no changes, as well, the patient was found to have elevated troponin at 0.16, the patient has chronically elevated troponins, and actually this is the lowest level for him in a few years, the patient denies having AICD firing, the patient did not have any episode of V-Tach on telemonitor in the ED, hospitalist service were requested to admit the patient for further monitoring and  work-up for his palpitations.   REVIEW OF SYSTEMS:  CONSTITUTIONAL:  Denies fever or chills.  Had episode of weakness, fatigue.  Denies any weight gain, weight loss.  EYES:  Denies blurry vision, double vision, inflammation, glaucoma.  EARS, NOSE, THROAT:  Denies tinnitus, ear pain, hearing loss, epistaxis or discharge.  RESPIRATORY:  Denies cough, wheezing, hemoptysis, painful respiratory.  Has baseline two pillow orthopnea.  CARDIOVASCULAR:  Denies chest pain, edema or syncope.  Had an episode of palpitations.  GASTROINTESTINAL:  Denies nausea, vomiting, diarrhea, abdominal pain, hematemesis, melena, jaundice.  GENITOURINARY:  Denies dysuria, hematuria, renal colic.  ENDOCRINE:  Denies polyuria, polydipsia, heat or cold intolerance.  HEMATOLOGY:  Denies anemia, easy bruising, bleeding diathesis.  INTEGUMENTARY:  Denies acne, rash or skin lesions.  MUSCULOSKELETAL:  Denies gout, arthritis or cramps.  NEUROLOGIC:  Denies CVA, TIA, ataxia, dementia, epilepsy.  PSYCHIATRIC:  Denies anxiety, insomnia, bipolar disorder or depression.   PAST MEDICAL HISTORY: 1.  Coronary artery disease.  2.  Hypertension.  3.  Hyperlipidemia.  4.  History of COPD.  5.  History of chronic kidney disease.  6.  History of left-sided nephrectomy due to renal cell carcinoma.  7.  Chronic catheter atrial fibrillation, on anticoagulation.  8.  Status post AICD.   ALLERGIES:  No known drug allergies.   HOME MEDICATIONS:  1.  Lisinopril 20 mg oral daily.  2.  Sublingual nitro as needed.  3.  Amiodarone 200 mg oral daily.  4.  Warfarin 2.5 mg oral daily.  5.  Atorvastatin  40 mg oral at bedtime.  6.  Metoprolol tartrate 50 mg oral 1-1/2 tablets 2 times a day.  7.  Albuterol nebs as needed.  8.  Lasix 20 mg oral daily.  9.  Vitamin D3 1000 international units daily.  10.  Magnesium oxide 400 mg oral 2 times a day.   SOCIAL HISTORY:  Lives at home with his wife.  Quit smoking 5 years ago.  No alcohol abuse.   No illicit drug use.   FAMILY HISTORY:  Mother died from complication of brain aneurysm.   PHYSICAL EXAMINATION: VITAL SIGNS:  Temperature 98, pulse 61, respiratory rate 20, blood pressure 127/84, saturating 96% on room air.  GENERAL:  Elderly male, well-nourished, looks comfortable in bed in no apparent distress.  HEENT:  Head atraumatic, normocephalic.  Pupils equal, reactive to light.  Pink conjunctivae.  Anicteric sclerae.  Moist oral mucosa.  NECK:  Supple.  No thyromegaly.  No JVD.  No carotid bruits.  CHEST:  Good air entry bilaterally.  No wheezing, rales, rhonchi.  CARDIOVASCULAR:  S1, S2 heard.  No rubs, murmurs, or gallops.  ABDOMEN:  Obese, soft, nontender, nondistended.  Bowel sounds present.  EXTREMITIES:  No edema.  No clubbing.  No cyanosis, +2 pedal and radial pulses bilaterally.  SKIN:  Normal skin turgor.  Warm and dry.  No rash.  NEUROLOGIC:  Cranial nerves grossly intact.  Motor 5 out of 5.  Sensation is symmetrical to light touch and intact.  PSYCHIATRIC:  Appropriate affect.  Awake, alert x 3.  Intact judgment and insight.  LYMPHATICS:  No cervical or axillary lymphadenopathy.   PERTINENT LABORATORY DATA:  Glucose 135, BUN 15, creatinine 1.28, sodium 143, potassium 3.6, chloride 106, CO2 30.  Anion gap 7.  Magnesium 1.6.  Troponin 0.16, CK-MB 1.9.  CK total 118.  White blood cells 6.8, hemoglobin 11.1, hematocrit 32.8, platelets 160.  INR 1.4.  EKG showing atrial paced rhythm with delayed AV conduction.   ASSESSMENT AND PLAN: 1.  Palpitations, the patient is known to have history of cardiomyopathy in the past, but most recent echo showing an ejection fraction of 55%, the patient reports his automatic implantable cardiac defibrillator was recently interrogated before three weeks at [facility] which he reports he was told everything was fine by his cardiologist, the patient will be admitted and he will be monitored on telemetry monitor, we will check his cardiac enzymes, as well he  has low magnesium which we will replace.  We will replace his potassium as well and we will consult cardiology Neoma Laming who is very familiar with the patient.  2.  Elevated troponin.  The patient denies any chest pain or any worsening shortness of breath, it appears to be clinically elevated, actually this is one of his lowest values for a long time, we will give 325 of aspirin x 1 and we will continue to cycle his cardiac enzymes.  3.  Congestive heart failure, appears to be compensated, last ejection fraction was 55%, we will continue patient on lisinopril, beta-blockers.  4.  History of coronary artery disease.  The patient denies any chest pain or shortness of breath, he is on statin, beta-blockers and lisinopril, we will give him one dose now of aspirin and we will leave it up to the cardiologist to decide if he should continue on aspirin as an outpatient or not.  5.  Cardiomyopathy status post automatic implantable cardiac defibrillator placement.  He is on beta-blocker and lisinopril.  6.  Hyperlipidemia.  Continue with statin.  7.  Hypertension.  Blood pressure acceptable.  Continue with home meds.  8.  History of chronic obstructive pulmonary disease, stable.  No wheezing.  We will continue on as needed albuterol.  9.  Atrial fibrillation, currently is rate-controlled.  The patient is on warfarin.  We will consult pharmacy to dose warfarin.  10.  DVT prophylaxis.  The patient is on warfarin for anticoagulation.  We will only add sequential compression device.  11.  CODE STATUS:  The patient reports he is a FULL CODE.   Total time spent on admission and patient care 55 minutes.    ____________________________ Albertine Patricia, MD dse:ea D: 04/15/2013 02:43:10 ET T: 04/15/2013 03:20:25 ET JOB#: [id number]  cc: Albertine Patricia, MD, <Dictator> Suraiya Dickerson Graciela Husbands MD ELECTRONICALLY SIGNED 04/24/2013 7:37

## 2015-01-14 NOTE — Discharge Summary (Signed)
PATIENT NAME:  Darius Taylor, Darius Taylor MR#:  [id number] DATE OF BIRTH:  10-05-1945  DATE OF ADMISSION:  01/19/2013 DATE OF DISCHARGE:  01/21/2013   PRIMARY CARDIOLOGIST: Dr. Humphrey Rolls.   DISCHARGE DIAGNOSES: 1.  Non-ST elevation myocardial infarction. 2.  Chronic congestive heart failure, diastolic.  3.  Atrial fibrillation status post defibrillator, off Coumadin due to gastrointestinal bleed. 4.  Hyperlipidemia.  5.  Hypertension.  6.  Stable chronic obstructive pulmonary disease.  7.  Chronic kidney disease, stage III.   IMAGING STUDIES DONE:  A chest x-ray which showed no acute edema.   PROCEDURES: Cardiac catheterization showed moderate disease in ostial left circumflex, right intermedius and proximal LAD and site of stent unchanged from cardiac catheterization of 11/13.  ADMITTING PHYSICIAN AND HOSPITAL COURSE:  Please see detailed H and P dictated by Dr. Verdell Carmine on 01/15/2013.  In brief, this is a 69 year old male patient presented to the hospital complaining of chest pain which began earlier that afternoon. He was found to have elevated troponin and was admitted to the hospitalist service on a heparin drip.   HOSPITAL COURSE:    1.  NSTEMI.  The patient had elevated troponin and was taken to cardiac catheterization by Dr. Humphrey Rolls of cardiology who did not find any changes compared to his last catheterization in 2013. The patient was advised aggressive medical management, was started on aspirin, Plavix, statin, beta blocker, ACE inhibitor, and is being discharged home to follow up with Dr. Humphrey Rolls 01/26/2013.  2.  The patient has history of atrial fibrillation is on Coumadin secondary to gastrointestinal bleed in the past. He has to watch out for any melena or hematochezia, hematemesis secondary to being on aspirin and Plavix for prevention of further acute coronary syndrome.    PHYSICAL EXAMINATION:  CARDIAC:  Today the patient has S1, S2, without any murmurs. LUNGS:  Sound clear on exam and is being  discharged home in fair condition.   DISCHARGE MEDICATIONS: 1.  Aspirin 10 mg oral once a day.  2.  Plavix 75 mg oral once a day.  3.  Lasix 20 mg oral once a day.  4.  Metoprolol tartrate 25 mg oral 2 times a day.  5.  Magnesium oxide 400 mg oral 2 times a day.  6.  Atorvastatin 40 mg oral once a day.  7.  Vitamin D3 1000 international units oral once a day.  8.  Lisinopril 20 mg oral once a day.  9.  Albuterol 3 mg inhaled every 4 to 6 hours as needed for dyspnea.  10.  Amiodarone 200 mg oral once a day.  11.  Isosorbide mononitrate 30 mg oral once a day.  12. Nitroglycerin 0.4 mg sublingual as needed for chest pain.   DISCHARGE INSTRUCTIONS: The patient will be on a low-sodium, low-fat diet. Activity as tolerated. Follow up with primary care physician in 1 to 2 weeks and with Dr. Humphrey Rolls on 01/26/2013.   The patient had an echocardiogram done.  His ejection fraction is pending at this time, but he had diastolic heart failure in the past. He is on beta blockers and ACE inhibitors of discharge.   TIME SPENT ON DAY OF DISCHARGE ACTIVITY: 46 minutes.    ____________________________ [name] Kyomi Hector, MD srs:ct D: 01/21/2013 13:48:53 ET T: 01/21/2013 14:30:34 ET JOB#: [id number]  cc: Alveta Heimlich R. Darvin Neighbours, MD, <Dictator> Arby Barrette, MD [place] MD ELECTRONICALLY SIGNED 02/04/2013 13:55

## 2015-01-14 NOTE — H&P (Signed)
PATIENT NAME:  Darius Taylor, Darius Taylor MR#:  [id number] DATE OF BIRTH:  September 28, 1945  DATE OF ADMISSION:  01/19/2013  PRIMARY CARE PHYSICIAN: Located at [place]: Chest pain.   HISTORY OF PRESENT ILLNESS: This is a 69 year old male who presents to the hospital complaining of chest pain that began earlier this afternoon. He describes the pain as a sharp pain located in the left side of his chest, nonradiating, associated symptoms shortness of breath, diaphoresis and dizziness. The patient said he developed the pain while he was in his car. He actually  pulled over in his driveway and sat there for a little while and then walked out of his car, sat on his porch. His neighbors noticed that he was not feeling well, called EMS, and he was brought to the hospital. The patient does have an AICD and thought that it was going to fire, although it did not shock him. I am still quite unsure why he has an AICD as he does not have a severe cardiomyopathy. The patient was brought to the hospital, and his troponin was noted to be mildly elevated at 0.2. Hospitalist services were contacted for further treatment and evaluation.   REVIEW OF SYSTEMS:   CONSTITUTIONAL: No documented fever. No weight gain, no weight loss.  EYES: No blurry or double vision.  ENT: No tinnitus. No postnasal drip. No redness of the oropharynx.  RESPIRATORY: No cough, no wheeze, no hemoptysis, no dyspnea.  CARDIOVASCULAR: Positive chest pain. No orthopnea, no palpitations, no syncope.  GASTROINTESTINAL: No nausea, no vomiting, no diarrhea. No abdominal pain, no melena or hematochezia.  GENITOURINARY: No dysuria, no hematuria.  ENDOCRINE: No polyuria or nocturia. No heat or cold intolerance.  HEMATOLOGIC: No anemia. No bruising. No bleeding.  INTEGUMENTARY: No rashes. No lesions.  MUSCULOSKELETAL: No arthritis. No swelling. No gout.  NEUROLOGIC: No numbness, no tingling. No ataxia. No seizure-type activity.  PSYCHIATRIC: No  anxiety, no insomnia. No ADD.   PAST MEDICAL HISTORY: Consistent with history of coronary artery disease, hypertension, hyperlipidemia, history of COPD, history of chronic kidney disease stage III, history of left-sided nephrectomy due to renal cell carcinoma, chronic atrial fibrillation status post AICD.   ALLERGIES: No known drug allergies.   SOCIAL HISTORY: He used to be a smoker for about 20 to 30 years, quit about 5 years ago. No alcohol abuse. No illicit drug abuse. He lives at home with his wife.   FAMILY HISTORY: The patient's mother died from complications of a brain aneurysm. Father, he cannot recall what his father died from.   CURRENT MEDICATIONS: The patient is currently on amiodarone 200 mg daily, DuoNebs q. 4 to 6 hours as needed, atorvastatin 40 mg daily, Lasix 20 mg daily, lisinopril 10 mg 2 tabs daily, magnesium oxide 400 mg b.i.d., metoprolol tartrate 75 mg b.i.d. and vitamin D3 1000 international units daily.   PHYSICAL EXAMINATION:  VITAL SIGNS:  On admission, temperature is 98.7, respirations 20, blood pressure 148/101, heart rate in the 60s.  GENERAL: He is a pleasant-appearing male in no apparent distress.  HEENT: He is atraumatic, normocephalic. His extraocular muscles are intact. His pupils are equal and reactive to light. His sclerae are anicteric. No conjunctival injection. No pharyngeal erythema.  NECK: Supple. No jugular venous distention. No bruits, no lymphadenopathy or thyromegaly.  HEART: Regular rate and rhythm. No murmurs or rubs. No clicks.  LUNGS: Clear to auscultation bilaterally. No rales or rhonchi. No wheezes.  ABDOMEN: Soft, flat, nontender, nondistended. Has  good bowel sounds. No hepatosplenomegaly appreciated.  EXTREMITIES: No evidence of any cyanosis or clubbing. He does have +1 pitting edema from the knees to the ankles bilaterally, but +2 pedal and radial pulses bilaterally.  NEUROLOGICAL: The patient is alert, awake and oriented x 3 with no focal  motor or sensory deficits appreciated bilaterally.  SKIN: Moist and warm with no rashes appreciated.  LYMPHATIC: There is no cervical or axillary lymphadenopathy.      LABORATORY AND RADIOLOGICAL DATA:  Serum glucose of 93, BUN 20, creatinine 1.3, sodium 142, potassium 3.7, chloride 106, bicarb 31. The patient's LFTs are within normal limits. Troponin 0.22. White cell count 6.1, hemoglobin 11.2, hematocrit 33.0, platelet count of 167.   The patient did have a chest x-ray done which showed mild bibasilar opacity secondary to atelectasis.   ASSESSMENT AND PLAN: This is a 69 year old male with a history of coronary artery disease, hypertension, hyperlipidemia, history of chronic obstructive pulmonary disease, history of chronic kidney disease stage III, history of renal cell carcinoma status post nephrectomy, chronic atrial fibrillation status post automatic implantable cardiac defibrillator, who presents to the hospital with chest pain and noted to have an elevated troponin.   1.  Chest pain:  The patient does have a mildly elevated troponin, therefore, this is suspicious for angina. The patient does have significant risk factors given his previous history of coronary  disease. The patient had a cath in November 2013 which showed some diffuse disease and some mild restenosis of his LAD stent. For now, I will admit the patient to telemetry. Continue on some aspirin, place him on some heparin drip. Continue beta blocker, statin, some nitro and morphine. I will get a cardiology consult. The patient was seen by Dr. Neoma Laming in November of last year. I will cycle his cardiac markers.  2.  Hypertension, presently hemodynamically stable:  I will continue his metoprolol and lisinopril.   3.  Hyperlipidemia:  Continue atorvastatin.  4.  History of chronic kidney disease stage III: The patient's creatinine is at baseline. The patient is status post left-sided nephrectomy due to renal cell carcinoma.  5.   History of chronic atrial fibrillation:  The patient is currently rate controlled, continue  his amiodarone and metoprolol. He is not on long-term anticoagulation given a history of GI bleeding.  6.  Chronic obstructive pulmonary disease: No evidence of acute exacerbation. Continue p.r.n. DuoNebs.   CODE STATUS: The patient is a FULL CODE.        TIME SPENT WITH ADMISSION:  50 minutes.   ____________________________ Belia Heman. Verdell Carmine, MD vjs:cb D: 01/19/2013 21:50:10 ET T: 01/19/2013 22:13:29 ET JOB#: [id number]  cc: Belia Heman. Verdell Carmine, MD, <Dictator> Henreitta Leber MD ELECTRONICALLY SIGNED 01/20/2013 19:39

## 2015-01-14 NOTE — Discharge Summary (Signed)
PATIENT NAME:  Darius Taylor, Darius Taylor MR#:  [id number] DATE OF BIRTH:  1945-10-13  DATE OF ADMISSION:  04/15/2013 DATE OF DISCHARGE:  04/16/2013  CONSULTANTS: Dr. Ubaldo Glassing from cardiology.  CHIEF COMPLAINT: Pain, palpitations.   DISCHARGE DIAGNOSES: 1.  History of dilated cardiomyopathy status post AICD placement, last ejection fraction 55%, status post interrogation of the AICD without ventricular or supraventricular arrhythmia.  2.  Coronary artery disease.  3.  Acute on chronic congestive heart failure with ejection fraction of 55%.  4.  Atrial fibrillation, rate controlled.  5.  History of hypertension.  6.  Hyperlipidemia.  7.  History of chronic obstructive pulmonary disease.  8.  History of chronic kidney disease. 9.  History of left-sided nephrectomy due to renal cell carcinoma.  9.  Chronic atrial fibrillation, on anticoagulation.   DISCHARGE MEDICATIONS:  1.  Furosemide 20 mg daily.  2.  Magnesium oxide 400 mg 1 tab 2 times a day. 3.  Atorvastatin 40 mg once a day. 4.  Vitamin D3 1000 international units once a day. 5.  Albuterol 3 ml inhaled solution every 4 to 6 hours as needed for dyspnea.  6.  Amiodarone 200 mg once a day. 7.  Nitroglycerin sublingual 0.4 mg 1 tab p.r.n. as needed for chest pain. 8.  Lisinopril 20 mg daily. 9.  Warfarin 2.5 mg daily and please take 2 tabs on Tuesday, Thursdays and Sundays. 10.  Metoprolol tartrate 100 mg every 12 hours.   DIET: Low sodium, low fat, low cholesterol.   ACTIVITY: As tolerated.   DISCHARGE INSTRUCTIONS: Please follow with your cardiologist within 1 to 2 weeks. Please follow with your PCP for INR check within 2 to 3 days.   DISPOSITION: Home.  HISTORY OF PRESENT ILLNESS AND HOSPITAL COURSE: For full details of H and P, please see the dictation on admission by Dr. Waldron Labs, but briefly this is a pleasant 69 year old male with history of AICD placement for dilated cardiomyopathy, history of congestive heart failure with EF of 55%  now and last cath in 2013 by Dr. Neoma Laming, his primary cardiologist here. He also follows at [facility]. He came in with feeling of funny sensation in his chest where he felt like he had some palpitations and feeling almost as if the AICD was going to fire, but it did not fire. EMS was called and he came into the hospital. He does have a positive troponin of 0.16, but it appears to be chronically elevated. He came in and got admitted to the hospitalist service, on telemetry, with remote monitoring. He was seen by Dr. Ubaldo Glassing from cardiology. The AICD was interrogated. He has paced rhythm without any SVTs or significant ventricular arrhythmias. Per cardiology note, this is predominantly A-paced V-sensed with occasional A-paced V-paced rhythm. We have increased his metoprolol to 100 mg b.i.d. We have continued his amiodarone. His INR is 1.4, but apparently it has been adjusted, but apparently his Coumadin dose has been adjusted recently at [facility]. He is on 2.5 mg daily, but he takes an additional tab on Tuesday, Thursday and Sundays. He will be discharged on that regimen. He is chest pain free, has no shortness of breath.   On the day of discharge, his heart rate is 63 being paced, temperature 98.6, respiratory rate 18, blood pressure 152/76 and O2 sat of 95%. He is a well-developed, abuse male in no obvious distress. Lungs appears to be clear to auscultation.  Irregularly irregular heart rhythm. Good effort on breath sounds. No significant  abdominal tenderness and no significant lower extremity edema. He will be discharged with outpatient follow-up.   The patient is FULL CODE.  TOTAL TIME SPENT:  35 minutes.   ____________________________ Vivien Presto, MD sa:sb D: 04/16/2013 12:19:16 ET T: 04/16/2013 12:50:27 ET JOB#: [id number]  cc: Vivien Presto, MD, <Dictator> Dionisio David, MD [place] MD ELECTRONICALLY SIGNED 04/26/2013 11:41

## 2015-01-16 NOTE — Consult Note (Signed)
PATIENT NAME:  Darius Taylor, Darius Taylor MR#:  [id number] DATE OF BIRTH:  1946-05-19  DATE OF CONSULTATION:  01/05/2012  REFERRING PHYSICIAN:   CONSULTING PHYSICIAN:  Dionisio David, MD  INDICATION FOR CONSULTATION: Patient with chest pain and history of defibrillator.  PRIMARY CARE PHYSICIAN: [facility]   HISTORY OF PRESENT ILLNESS: This is a 69 year old Caucasian male who came in to the hospital because of nausea, vomiting, diarrhea and hematemesis and then all of a sudden he passed out. He had some chest pain while he was vomiting but he denies any chest pain right now is. He has history of chronic obstructive pulmonary disease, congestive heart failure, cardiomyopathy, atrial fibrillation status post implantable cardiac defibrillator implantation, hypertension.   PAST MEDICAL HISTORY:  1. As mentioned history of congestive heart failure.  2. History of diastolic dysfunction.  3. History of cardiomyopathy. 4. History of atrial fibrillation status post defibrillator on Coumadin. Defibrillator was placed seven years ago at [facility]. 5. Hyperlipidemia. 6. Hypertension. 7. Chronic obstructive pulmonary disease. 8. Renal cell carcinoma status post left nephrectomy.  9. Gout.  10. Stage III renal disease. 11. 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   MEDICATIONS:  1. Metoprolol 25 b.i.d.  2. Coumadin 5 mg p.o. daily.  3. Indomethacin 50 mg q.8. 4. Simvastatin 10 mg p.o. q.8. 5. Amiodarone 400 mg p.o. daily.  6. Lisinopril 10 mg p.o. daily.  7. Lasix 20 mg p.o. daily.  8. Celexa 20 mg p.o. daily.  9. Lisinopril 10 mg p.o. daily.   SOCIAL HISTORY: Used to smoke but quit recently. He used to drink but also does occasionally still.   FAMILY HISTORY: Mother had an aneurysm.   PHYSICAL EXAMINATION:  GENERAL: He is alert, oriented x3, in no acute distress.   VITAL SIGNS: Temperature 98, pulse 61, respirations 18, blood pressure 90/50, saturation 98.   NECK: Positive  jugular venous distention.   LUNGS: Good air entry. No wheezing. No rales.   HEART: Regular rate, rhythm. Normal S1, S2. No audible murmur.   ABDOMEN: Soft, nontender, positive bowel sounds.   EXTREMITIES: No pedal edema.   NEUROLOGIC: Patient appears to be intact.   LABORATORY, DIAGNOSTIC AND RADIOLOGICAL DATA: EKG shows AV sequential pacing, atrial and ventricular are being paced AV sequential pacing at 60 beats per minute. BUN 58, creatinine 1.74, glucose 116, sodium 146, chloride 110. His initial troponin was 0.12. Hemoglobin was 7.8 and right now is 8. INR 2.3   ASSESSMENT AND RECOMMENDATIONS: Patient was admitted with hematemesis and GI bleed, is being considered for Mallory-Weiss tear and had some chest pain but is chest pain free right now. Mildly elevated troponin is probably due to demand ischemia and not due to non-STEMI. I don't think he needs to be on heparin. He has history of severe LV dysfunction and has a defibrillator. Seems like looking at the EKG the pacemaker and the defibrillator seems to be working fine. Advised work-up for GI and will follow cardiology wise.   Thank you very much for referral.  ____________________________ Dionisio David, MD sak:cms D: 01/05/2012 10:52:23 ET T: 01/05/2012 11:07:04 ET JOB#: [id number]  cc: Dionisio David, MD, <Dictator> Dionisio David MD ELECTRONICALLY SIGNED 01/10/2012 9:04

## 2015-01-16 NOTE — Consult Note (Signed)
Pt seen and examined. Admitted with acute UGI bleeding and coagulapathy. Received 2 units of FFP and 2 units of PRBC so far. Hemetemesis last night but none so far. Placed on both octreotide and protonix drip. Hgb and PT pending this AM. If INR near 1.5 or lower, will proceed with EGD this AM. Otherwise, will postpone until tomorrow AM. THanks.  Electronic Signatures: Verdie Shire (MD)  (Signed on 13-Apr-13 10:32)  Authored  Last Updated: 13-Apr-13 10:32 by Verdie Shire (MD)

## 2015-01-16 NOTE — Discharge Summary (Signed)
PATIENT NAME:  Darius Taylor, Darius Taylor MR#:  [id number] DATE OF BIRTH:  [birth year]  DATE OF ADMISSION:  01/04/2012 DATE OF DISCHARGE:  01/09/2012  DISCHARGE DIAGNOSES:  1. Upper gastrointestinal bleed secondary to severe esophagitis and esophageal ulcers.  2. Anemia, acute, secondary to upper GI bleed.  3. Coagulopathy, reversed.  4. Hypertension.  5. Chronic systolic failure.  6. Cardiomyopathy.  7. History of atrial fibrillation.  8. Status post automatic implantable cardiac defibrillator.  9. Hyperlipidemia.  10. Chronic kidney disease stage III.  11. Chronic obstructive pulmonary disease, stable. 12.  History of gout.   CONSULTATIONS:  1. GI, Dr. Verdie Shire. 2. Cardiology, Dr. Neoma Laming.   PROCEDURES: Upper GI endoscopy.   HOSPITAL COURSE: This is a 69 year old male who has a history of multiple medical problems. He has a history of cardiomyopathy, chronic systolic failure, chronic atrial fibrillation, status post automatic implantable cardiac defibrillator on Coumadin, hypertension, hyperlipidemia, chronic obstructive pulmonary disease, history of renal cell carcinoma with left nephrectomy, chronic kidney stage III, and gout. He presented with nausea, vomiting, diarrhea, and hematemesis. He was also complaining of melanotic stools. He was admitted to the Intensive Care Unit for upper GI bleed. He also had some mild chest pain when he came in. He had no prior history of liver disease. He was started on a Protonix drip and octreotide drip. When he came in, his hemoglobin was 6.8. His INR was supratherapeutic. He had an INR of 4.1. He received multiple units of packed RBC transfusion during the hospital stay. He received about 6 units of packed RBCs and about 4 units of FFP. With his INR coming down to 1.8, he was finally taken for endoscopy. His upper GI endoscopy on April 15 showed that he has severe grade C reflux esophagitis and nonbleeding esophageal ulcers, normal stomach, and erythematous  duodenopathy. He will be discharged on PPI, Protonix b.i.d. Coumadin and aspirin have been suspended at this time because of acute upper GI bleed. His hemoglobin now is stable in the range of 8.3 to 8.5. We will also add some iron pills to the regimen. Initially blood pressure medications were stopped but they have been restarted now and he is on metoprolol and lisinopril, which I am going to continue. He also had some elevated cardiac enzymes when he  came in, in the range of 0.12 with normal CPK and MB. Cardiology saw him and thinks it is all demand ischemia in the setting of upper GI bleed and hypotension. The patient follows with cardiology at [facility]. He can continue following with cardiology there or can follow up at [place] with Dr. Neoma Laming.  When he came in, his creatinine was 1.64. He has some chronic kidney disease stage III, but his creatinine has normalized to 0.99 now. Dr. Candace Cruise followed him as a GI consultant during the hospital stay. He had no further melena or hematemesis. He is tolerating a solid diet. When he came in he had some mild wheezing, he was started on albuterol p.r.n. and Symbicort. We will give him his inhalers that he has been using in the hospital.   DISCHARGE MEDICATIONS:  1. Simvastatin 10 mg at bedtime.  2. Lisinopril 10 mg daily.  3. Furosemide 20 mg daily. 4. Citalopram 40 mg daily.  5. Magnesium oxide 400 mg b.i.d.  6. Metoprolol 50 mg, 1.5 tablets twice a day.  7. Amiodarone 200 mg daily.  New medications:  8. Protonix 40 mg p.o. b.i.d.  9. Ferrous sulfate  325 mg p.o. b.i.d. with meals.  10. Albuterol metered dose inhaler 2 puffs q. 4 to 6 hours as needed for wheezing. 11. Symbicort b.i.d. I am not giving him prescriptions for albuterol metered dose inhaler and Symbicort. We are giving him the inhalers from the hospital. If he needs further, then his primary care physician can order it.  NOTE: Do not take aspirin. Do not take warfarin.  Do not  take ibuprofen or Aleve.   CONDITION AT DISCHARGE: He is stable. He is comfortable. T-max 98.5, heart rate 64, blood pressure 120/76.  His sats were previously 93% on room air. We have to recheck his sats before discharge.    DIET: Advised a low-sodium soft diet.   FOLLOWUP:  1. The patient should follow up with PMD at [facility] early next week. Follow up hemoglobin at  [facility].  2. Follow up with Dr. Candace Cruise at [place] in one to two weeks. 3. Follow up with primary cardiologist at [facility] or Dr. Neoma Laming at [facility]. 4. His Coumadin may need to be started down the line, but I will leave it up to the primary care physician at [facility].  TIME SPENT ON DISCHARGE: 55 minutes.    ____________________________ Mena Pauls, MD ag:bjt D: 01/09/2012 13:46:45 ET T: 01/10/2012 11:08:55 ET JOB#: [id number]  cc: Mena Pauls, MD, <Dictator> [facility] Mena Pauls MD ELECTRONICALLY SIGNED 01/17/2012 11:34

## 2015-01-16 NOTE — Consult Note (Signed)
EGD showed signif esophagitis with linear esophageal ulcers. No active bleeding now but probable bleeding recently. NO esophageal varices or portal gastropathy. D/C octreotide. Can switch to po protonix bid. Full liquid diet ordered and reg diet if tolerated. Hold off on coumadin longer if possible to allow proper healing of esophagus. I will be out tomorrow but will check back on Wed. Thanks.  Electronic Signatures: Verdie Shire (MD)  (Signed on 15-Apr-13 15:45)  Authored  Last Updated: 15-Apr-13 15:45 by Verdie Shire (MD)

## 2015-01-16 NOTE — Consult Note (Signed)
Chief Complaint:   Subjective/Chief Complaint Stable from GI point of view. No abd pain. No further bleeding. Hgb stable.   VITAL SIGNS/ANCILLARY NOTES: **Vital Signs.:   17-Apr-13 09:47   Vital Signs Type Q 4hr   Temperature Temperature (F) 98.5   Celsius 36.9   Temperature Source oral   Pulse Pulse 64   Pulse source per Dinamap   Respirations Respirations 18   Systolic BP Systolic BP 414   Diastolic BP (mmHg) Diastolic BP (mmHg) 76   Mean BP 90   BP Source Dinamap   Pulse Ox % Pulse Ox % 88   Pulse Ox Activity Level  At rest   Oxygen Delivery Room Air/ 21 %   Brief Assessment:   Cardiac Irregular    Respiratory clear BS    Gastrointestinal Normal   Blood Glucose:  16-Apr-13 09:12    POCT Blood Glucose 83  Routine Hem:  17-Apr-13 04:41    Hemoglobin (CBC) 8.3  Routine Chem:  17-Apr-13 04:41    Glucose, Serum 82   BUN 10   Creatinine (comp) 0.99   Sodium, Serum 141   Potassium, Serum 3.5   Chloride, Serum 104   CO2, Serum 29   Calcium (Total), Serum 8.5   Osmolality (calc) 279   eGFR (African American) >60   eGFR (Non-African American) >60   Anion Gap 8   Assessment/Plan:  Assessment/Plan:   Assessment Esophagitis/esophageal ulcers. On protonix    [place] for discharge from GI point of view. COntinue protonix or PPI BID. Pt's primary to decide if and when to resume coumadin. Make sure patient f/u with [place] in office in 2-3 wks. Thanks   Electronic Signatures: Verdie Shire (MD)  (Signed 17-Apr-13 13:12)  Authored: Chief Complaint, VITAL SIGNS/ANCILLARY NOTES, Brief Assessment, Lab Results, Assessment/Plan   Last Updated: 17-Apr-13 13:12 by Verdie Shire (MD)

## 2015-01-16 NOTE — H&P (Signed)
PATIENT NAME:  Darius Taylor, Darius Taylor MR#:  [id number] DATE OF BIRTH:  Apr 01, 1946  DATE OF ADMISSION:  01/04/2012  PRIMARY MD: [facility].    CHIEF COMPLAINT: Nausea, vomiting, and diarrhea with hematemesis after he came to the ER.   HISTORY OF PRESENT ILLNESS: Mr. Ponzi is a 69 year old elderly Caucasian male with past medical history significant for COPD, congestive heart failure, cardiomyopathy with preserved ejection fraction status post defibrillator, atrial fibrillation on Coumadin, hypertension, prior history of alcohol abuse presents to the hospital with the above-mentioned complaints. The patient states his wife has been sick with nausea, vomiting for the past week; and since Tuesday, which is three days from now, he has been having nausea, vomiting, diarrhea, abdominal pain. His vomitus was initially clear, more bilious, and for the past couple of days it has been blood mixed with saliva. He had two episodes of frank hematemesis in the ED. His hemoglobin is down to 6.8 whereas his baseline is around 9 to 10, and his INR is 4.0 while he is on Coumadin. He states he feels extremely weak and dizzy and almost had a syncopal episode today getting out of bed. He had also had an episode of chest pain while in the ED with slightly elevated troponin. He also complains of black melenic stools over the past 3 to 4 days. Does not take any NSAIDs and is not drinking any alcohol. No prior history of varicocele disease. Last colonoscopy was at [facility] several years ago. Not sure if he had an endoscopy at that time or not.   PAST MEDICAL HISTORY:  1. Congestive heart failure with diastolic dysfunction.  2. Cardiomyopathy.  3. Atrial fibrillation, status post defibrillator, on Coumadin.  4. Hyperlipidemia.  5. Hypertension.  6. Chronic obstructive pulmonary disease.  7. Renal cell carcinoma, status post left nephrectomy.  8. Gout.   9. CKD stage III.    PAST SURGICAL HISTORY:  1. Left nephrectomy for  renal cell carcinoma.  2. Defibrillator placement.   ALLERGIES TO MEDICATIONS: No known drug allergies.   CURRENT MEDICATIONS AT HOME:  1. Metoprolol 25 mg p.o. b.i.d.  2. Coumadin 5 mg p.o. daily.  3. Indomethacin 50 mg q.8 hours.  4. Simvastatin 10 mg p.o. q.8 hours.  5. Amiodarone 400 mg p.o. daily.  6. Magnesium oxide 400 mg p.o. b.i.d.  7. Lisinopril 10 mg p.o. daily.  8. Lasix 20 mg p.o. daily.  9. Celexa 20 mg p.o. daily.     SOCIAL HISTORY: Lives at home with wife. Used to smoke but none currently and used to drink a lot of alcohol but has not used any alcohol in the last few months almost close to a year.    FAMILY HISTORY: Mother with aneurysm.  Does not know much about his father.   REVIEW OF SYSTEMS. CONSTITUTIONAL: No fever. Positive for fatigue and weakness. EYES: No blurred vision, double vision, inflammation, glaucoma or cataracts. ENT: No tinnitus, ear pain, hearing loss, epistaxis or discharge. RESPIRATORY: No cough, wheeze, history of chronic obstructive pulmonary disease. CARDIOVASCULAR: Positive for chest pain, especially when his vomiting. No orthopnea, edema, arrhythmia, palpitations, or syncope. GI: Positive for nausea, vomiting, diarrhea, abdominal pain, hematemesis, and also melena. GENITOURINARY: No dysuria, hematuria, renal calculus, frequency, or incontinence. ENDOCRINE: No polyuria, nocturia, thyroid problems, heat or cold intolerance. HEMATOLOGY: Positive for anemia and also bleeding.  No easy bruising. SKIN: No acne, rash, or lesions. MUSCULOSKELETAL: No neck, back, shoulder pain.  Positive for gout.  NEUROLOGIC: No numbness,  weakness cerebrovascular accident, transient ischemic attack, or seizures. PSYCHOLOGICAL: No anxiety, insomnia, or depression.   PHYSICAL EXAMINATION:  VITAL SIGNS: Temperature afebrile, pulse 61, respirations 18, blood pressure 90/50, pulse oximetry 98% on room air.   GENERAL: Well-built, well-nourished male, very pale looking currently,  sitting in bed, not in any acute distress.   HEENT: Normocephalic, atraumatic. Pupils equal, round, reacting to light. Anicteric sclerae. Extraocular movements intact. Pale conjunctiva. Oropharynx clear without erythema, mass, or exudates.   NECK: Supple. No thyromegaly, JVD, or carotid bruits. No lymphadenopathy.   LUNGS: Moving air bilaterally. No wheeze or crackles. No use of accessory muscles for breathing.  Has scattered wheeze.     CARDIOVASCULAR: S1, S2.  A 3/6 systolic murmur present.  Irregular rhythm. No rubs or gallops.   ABDOMEN: Soft, nontender.  Appears distended but no hepatosplenomegaly. No guarding or rigidity.  Hypoactive bowel sounds.   EXTREMITIES: No pedal edema. No clubbing or cyanosis. Dorsalis pedis pulses palpable bilaterally.   SKIN: No acne, rash, or lesions.   LYMPHATICS: No cervical lymphadenopathy.   NEUROLOGIC: Cranial nerves intact. No focal motor or sensory deficit.   PSYCHOLOGICALLY: The patient is awake, alert, oriented x3.   LABORATORY DATA:  WBC 7.3, hemoglobin 6.8, hematocrit 21.2, platelet count 119,000. Sodium 142, potassium 3.8, chloride 110, bicarbonate 24, BUN 50, creatinine 1.64, glucose of 87, and calcium of 7.9. ALT 23, AST 26, alkaline phosphatase 46, total bilirubin of 0.1, albumin of 3.8. Lipase is 53.  INR 4.0, troponin 0.12. CK, CK-MB within normal limits.   EKG with atrial fibrillation, paced rhythm.    ASSESSMENT AND PLAN: A 69 year old male with history of hypertension, chronic obstructive pulmonary disease, congestive heart failure, status post automatic implantable cardiac defibrillator, coronary artery disease, past history of alcohol abuse admitted for nausea, vomiting, abdominal pain, having hematemesis while in the ED, hemoglobin of 6.8, and INR of 4.0.   1. Hematemesis with possible upper gastrointestinal bleed, likely Mallory-Weiss tear; however, peptic ulcer disease while using indomethacin with gastritis cannot be ruled out.  With prior alcohol history, vascular bleed could also be a possibility. We will place him in CCU with active bleeding, IV Protonix and also IV octreotide ordered.  Dr Candace Cruise has been notified and possible endoscopy in a.m. Two units of packed RBC transfusion ordered and also FFP ordered.  2. Unstable angina with history of coronary artery disease: The patient presenting with chest pain. It could be more like chest pain from heartburn, reflux, or Mallory-Weiss tear.  However, he is anemic with slightly elevated troponin.  Cannot be started on any aspirin on IV heparin because of his active bleeding. No EKG changes.  We will consult Cardiology.  Sublingual nitroglycerin p.r.n. for chest pain, recycle enzymes, and monitor on telemetry for now.  3. Acquired coagulopathy, on Coumadin for his atrial fibrillation, being reversed FFP at this time and recheck INR in the morning.  4. Chronic obstructive pulmonary disease, mild scattered wheeze. Added Symbicort and albuterol inhaler.  5. Congestive heart failure with diastolic dysfunction from last catheterization in November 2011.  Cardiology consulted.  One dose of Lasix after transfusion. Continue to monitor.  Hold p.o. Lasix as he is having active bleeding and low-normal blood pressure.   6. Chronic kidney disease stage III, appears to be at baseline status post left nephrectomy secondary to renal cell carcinoma. BUN is slightly elevated secondary to gastrointestinal bleed.    7. Gastrointestinal and deep vein thrombosis prophylaxis. On IV Protonix and also on TED stockings.  CODE STATUS: FULL CODE.   TOTAL CRITICAL CARE TIME SPENT ON THIS ADMISSION:  60 minutes    ____________________________ Gladstone Lighter, MD rk:vtd D: 01/04/2012 19:21:26 ET T: 01/05/2012 05:56:15 ET JOB#: [id number]  cc: Gladstone Lighter, MD, <Dictator> [facility] Gladstone Lighter MD ELECTRONICALLY SIGNED 01/08/2012 15:05

## 2015-01-16 NOTE — Consult Note (Signed)
PATIENT NAME:  Darius Taylor, Darius Taylor MR#:  [id number] DATE OF BIRTH:  22-Jul-1946  DATE OF CONSULTATION:  01/05/2012  REFERRING PHYSICIAN:   CONSULTING PHYSICIAN:  Lupita Dawn. Kameshia Madruga, MD  REASON FOR REFERRAL: Hematemesis and coagulopathy.   HISTORY: The patient is a 69 year old male with a known history of congestive heart failure, cardiomyopathy and chronic obstructive pulmonary disease on chronic Coumadin as well as aspirin, who has been complaining of nausea and vomiting for the past week. For the past several days, has complained of increasing abdominal pain and then came with at least a few episodes of frank hematemesis at least in the Emergency Room. In the Emergency Room, his hemoglobin was down to 6.8 where his baseline is from 9 to 10. His INR was greater than 4. He has been feeling weak and dizzy and almost had a syncopal episode the day of arrival to the Emergency Room getting out of bed. He also had slight chest pain in the ER with a slight elevated troponin level. He has been complaining of black stools for the past 3 to 4 days. He used to be a heavy drinker in the past, but not recently. He denies any aspirin. Denies any NSAID use. He has had a colonoscopy at [facility] several years ago which was reportedly negative. Never had a prior endoscopy. When I saw him in the morning, he had already received 2 units of FFP and 2 units of packed red blood cells so far over the night. Since being admitted to the Intensive Care Unit, he has not had any further bleeding. We had already put him on Protonix drip and octreotide drip in case he had alcoholic liver disease. The labs are still pending at this point.   PAST MEDICAL HISTORY:  1. Congestive heart failure. 2. Cardiomyopathy.  3. History of atrial fibrillation, had a defibrillator placed.  4. Hypertension.  5. Hyperlipidemia.  6. Chronic obstructive pulmonary disease.  7. Renal cell carcinoma requiring left nephrectomy in the past.  8. History of  gout. 9. Chronic renal disease.   PAST SURGICAL HISTORY:  1. Nephrectomy. 2. Defibrillator placement.   ALLERGIES: He has no known drug allergies.   MEDICATIONS AT HOME:  1. Coumadin. 2. Metoprolol. 3. Indocin. 4. Zocor. 5. Amiodarone. 6. Lisinopril. 7. Magnesium. 8. Lasix. 9. Celexa.   SOCIAL HISTORY: He used to smoke, but not recently. Also, used to drink a lot, but not in the past several months.   FAMILY HISTORY: Notable for aneurysm.   REVIEW OF SYSTEMS:  No change from admission. Please refer to the one dictated on 04/12.   PHYSICAL EXAMINATION:  GENERAL: The patient was in no acute distress.   VITAL SIGNS: His temperature this morning was 98.5, pulse rate 94, blood pressure 132/74,  respirations 13, oxygen saturation 97 on 2 liters.   HEENT: Normocephalic, atraumatic head. Pupils are equally reactive. Throat was clear.   NECK: Supple.   CARDIAC: Somewhat irregular rhythm, systolic murmur is present.   LUNGS: Clear bilaterally. I did not hear any wheezes or rhonchi.   ABDOMEN: Soft and nontender. There is no hepatomegaly. He had active bowel sounds. There are no palpable masses.   EXTREMITIES: No clubbing, cyanosis, or edema.   SKIN: Negative.   NEUROLOGIC: The patient was intact and alert. No obvious focal deficits.   LABORATORY, RADIOLOGICAL AND DIAGNOSTIC DATA: When he came in, his sodium was 143, potassium 3.8, chloride 110, BUN 50, creatinine 1.6, glucose 87. Lipase was 53. Liver enzymes were normal.  Troponin level is elevated at 0.12. Hemoglobin was 6.8, white count 7.5, platelet count 199. INR was 4.0 and today's levels are still pending on 04/13.   ASSESSMENT AND PLAN: This is a patient with acute upper gastrointestinal bleeding. It is unclear whether he has bleeding ulcers or esophagitis. If he has alcoholic liver disease even though his liver enzymes are normal, he could have esophageal varices or portal gastropathy as well. We will continue with the  Protonix IV and octreotide IV for now. We will see what his repeat INR and hemoglobin is. If the INR is less than 2, then will proceed with upper endoscopy later this morning. Otherwise, we may have to postpone until tomorrow.   ____________________________ Lupita Dawn. Candace Cruise, MD pyo:ap D: 01/09/2012 14:13:03 ET T: 01/10/2012 08:34:05 ET JOB#: [id number]  cc: Lupita Dawn. Candace Cruise, MD, <Dictator> Hulen Luster MD ELECTRONICALLY SIGNED 01/18/2012 9:37

## 2015-01-16 NOTE — Consult Note (Signed)
Repeat Labs show hgb of 8.0 and INR of 2.3. May need additional PRBC and FFP today. Will plan EGD tomorrow AM. Thanks.  Electronic Signatures: Verdie Shire (MD)  (Signed on 13-Apr-13 11:41)  Authored  Last Updated: 13-Apr-13 11:41 by Verdie Shire (MD)

## 2015-01-16 NOTE — Consult Note (Signed)
Chief Complaint:   Subjective/Chief Complaint Drop in hgb overnight. No more hematemesis. Received  2 units of PRBC. Finished this AM. Repeat Hgb 7.8. INR still pending.   VITAL SIGNS/ANCILLARY NOTES: **Vital Signs.:   14-Apr-13 07:23   Vital Signs Type Routine   Pulse Pulse 64   Respirations Respirations 16   Systolic BP Systolic BP 446   Diastolic BP (mmHg) Diastolic BP (mmHg) 60   Mean BP 76   BP Source non-invasive   Pulse Ox % Pulse Ox % 94   Pulse Ox Activity Level  At rest   Oxygen Delivery Room Air/ 21 %   Pulse Ox Heart Rate 64   Brief Assessment:   Cardiac Regular    Respiratory clear BS    Gastrointestinal Normal   Routine Hem:  14-Apr-13 07:28    WBC (CBC) 8.8   RBC (CBC) 2.84   Hemoglobin (CBC) 7.8   Hematocrit (CBC) 24.2   Platelet Count (CBC) 146   MCV 85   MCH 27.6   MCHC 32.3   RDW 16.6   Neutrophil % 72.6   Lymphocyte % 15.8   Monocyte % 10.2   Eosinophil % 1.0   Basophil % 0.4   Neutrophil # 6.4   Lymphocyte # 1.4   Monocyte # 0.9   Eosinophil # 0.1   Basophil # 0.0  Routine Chem:  14-Apr-13 07:28    Glucose, Serum 111   BUN 47   Creatinine (comp) 1.54   Sodium, Serum 147   Potassium, Serum 4.1   Chloride, Serum 112   CO2, Serum 25   Calcium (Total), Serum 7.8   Osmolality (calc) 305   eGFR (African American) 53   eGFR (Non-African American) 46   Anion Gap 10   Assessment/Plan:  Assessment/Plan:   Assessment UGI bleeding. Coagulapathy.    Plan EGD pending INR results. Thanks..   Electronic Signatures: Verdie Shire (MD)  (Signed 14-Apr-13 08:20)  Authored: Chief Complaint, VITAL SIGNS/ANCILLARY NOTES, Brief Assessment, Lab Results, Assessment/Plan   Last Updated: 14-Apr-13 08:20 by Verdie Shire (MD)

## 2015-01-16 NOTE — Consult Note (Signed)
Repeat INR >4. Cancel EGD. 2 units of FFP and 1 unit of PRBC ordered. Clear liquid but NPO after MN for EGD tomorrow afternoon. Thanks  Electronic Signatures: Verdie Shire (MD)  (Signed on 14-Apr-13 10:32)  Authored  Last Updated: 14-Apr-13 10:32 by Verdie Shire (MD)

## 2015-01-16 NOTE — Consult Note (Signed)
Consult dictated,65 YOWM came wit GIB and had an episoden of chest pain, but it was while he was vomiting and retching. No chest pain any more,and mildly elevated trponin due to demand ischaemia. EKG shows A-V sequential pacing 60/min. Advise GI work up, no cardiac workup needed at this time.  Electronic Signatures: Angelica Ran (MD)  (Signed on 13-Apr-13 10:56)  Authored  Last Updated: 13-Apr-13 10:56 by Angelica Ran (MD)

## 2015-01-23 NOTE — H&P (Signed)
PATIENT NAME:  Darius Taylor, Darius Taylor MR#:  [id number] DATE OF BIRTH:  1945/10/19  DATE OF ADMISSION:  10/19/2014  PRIMARY CARE PHYSICIAN: At [facility].   CHIEF COMPLAINT: Shortness of breath, cough, and hemoptysis.   HISTORY OF PRESENT ILLNESS: This is a 69 year old male who presents to the hospital from his primary care physician's office as he was found to be hypoxic, short of breath, and having hemoptysis for the past 3-4 days. The patient says that he has not been feeling well as he has been having exertional dyspnea now for the past 4-5 days. He does have a history of COPD, he was using his albuterol inhaler, but it was not improving his symptoms. He also has been having on and off blood-tinged sputum now for the past few days. He went to see his primary care physician and they referred him to the ER for further evaluation. In the Emergency Room the patient was noted to be hypoxic with room air O2 saturations in the mid to high 80s. The patient was also noted to have diffuse wheezing and noted to have a mild COPD exacerbation. Hospitalist services were contacted for further treatment and evaluation. The patient denies any paroxysmal nocturnal dyspnea, orthopnea, any syncope. He does admit to some chest pain, but mostly related to his cough. Positive nausea, but no vomiting. No abdominal pain. No diarrhea. No melena, and no other acute bleeding noted.   REVIEW OF SYSTEMS:    CONSTITUTIONAL: No documented fever. No weight gain or weight loss.  EYES: No blurry or double vision.  EARS, NOSE, AND THROAT: No tinnitus. No postnasal drip. No redness of the oropharynx.  RESPIRATORY: Positive cough. Positive wheeze. No hemoptysis. Positive dyspnea. Positive COPD.   CARDIOVASCULAR: Positive for pleuritic chest pain. No orthopnea, no palpitations, no syncope.  GASTROINTESTINAL: No nausea, vomiting, diarrhea. No abdominal pain. No melena or hematochezia.  GENITOURINARY: No dysuria or hematuria.   ENDOCRINE: No polyuria, nocturia, heat or cold intolerance.  HEMATOLOGY: No anemia. No bruising. No bleeding.  INTEGUMENTARY: No rashes, no lesions.  MUSCULOSKELETAL: No arthritis, no swelling, no gout.  NEUROLOGIC: No numbness, tingling. No ataxia. No seizure-type activity.  PSYCHIATRIC: No anxiety. No insomnia. No ADD.   PAST MEDICAL HISTORY: Consistent with chronic atrial fibrillation/flutter, history of diastolic CHF, status post AICD due to history of ventricular tachycardia, COPD, coronary disease, hypertension, history of renal cell carcinoma.   ALLERGIES: No known drug allergies.   SOCIAL HISTORY: Used to be a smoker, quit 10 years ago. Does have a 20-30 pack-year smoking history. No alcohol abuse. No illicit drug abuse. Lives with his wife.   FAMILY HISTORY: Mother and father are both deceased. Mother died from complications of a brain aneurysm. Father died from an accidental death.   CURRENT MEDICATIONS: Albuterol nebulizer every 4-6 hours as needed, amiodarone 200 mg daily, atorvastatin 80 mg at bedtime, colchicine 0.6 mg every 8 hours for gout as needed, Coumadin 4 mg Monday, Wednesday, and Friday and then Coumadin 5 mg Sunday, Tuesday, Thursday, Lasix 20 mg daily, lisinopril 20 mg daily, magnesium oxide 400 mg b.i.d., metoprolol succinate 200 mg daily, sublingual nitroglycerin as needed, albuterol inhaler 2 puffs q. 4-6 hours as needed, Symbicort 2 puffs b.i.d., and vitamin D3 1000 international units daily.   PHYSICAL EXAMINATION:  VITAL SIGNS: Temperature 98.8, pulse 67, respirations 20, blood pressure 179/123, saturations 97% on 2 liters nasal cannula.  GENERAL: He is a pleasant-appearing male in mild respiratory distress.  HEAD, EYES, EARS, NOSE, AND THROAT:  Atraumatic, normocephalic. Extraocular muscles are intact. Pupils equal and reactive to light. Sclerae anicteric. No conjunctival injection. No pharyngeal erythema.  NECK: Supple. There is no jugular venous distention. No  bruits. No lymphadenopathy or thyromegaly.  HEART: Regular rate and rhythm. No murmurs, no rubs, no clicks.  LUNGS: He has some coarse rhonchi and inspiratory and expiratory wheezing. Negative use of accessory muscles. No dullness to percussion.  ABDOMEN: Soft, flat, nontender, nondistended. He has good bowel sounds. No hepatosplenomegaly appreciated.  EXTREMITIES: No evidence of any cyanosis, clubbing, or peripheral edema. Has + 2 pedal and radial pulses bilaterally.  NEUROLOGICAL: The patient is alert, awake, and oriented x 3. No focal motor or sensory deficits appreciated bilaterally.  SKIN: Moist and warm with no rashes appreciated.  LYMPHATIC: There is no cervical or axillary lymphadenopathy.   LABORATORY DATA: Serum glucose of 92, BUN 16, creatinine 1.15, sodium 147, potassium 3.4, chloride 108, bicarbonate 33. BNP is 3200. Troponin 0.39. White cell count 10.2, hemoglobin 11.1, hematocrit 34.2, platelet count 210,000. INR is 3.5.   The patient did have a chest x-ray done which shows mild pulmonary vascular congestion, but no other acute process.   ASSESSMENT AND PLAN: This is a 69 year old male with history of chronic atrial fibrillation/flutter, history of congestive heart failure, status post automatic implantable cardiac defibrillator due to history of ventricular tachycardia, chronic obstructive pulmonary disease, coronary disease, hypertension, history of renal cell carcinoma, who presented to the hospital with shortness of breath, cough, and hemoptysis for the past 4-5 days.   1.  Chronic obstructive pulmonary disease exacerbation. This is likely the cause of the patient's shortness of breath, cough, and hemoptysis. I will treat the patient with IV steroids. Continue around-the-clock nebulizer treatments. Continue his Symbicort. We will check sputum cultures. The patient needs to be assessed for home oxygen prior to discharge.  2.  Hemoptysis. I think this is likely related to his chronic  obstructive pulmonary disease and also the patient being on Coumadin with his INR supratherapeutic. His hemoglobin is currently stable. His chest x-ray shows no evidence of any lung mass or any focal pneumonia. I will follow him clinically for now.  3.  History of congestive heart failure. I do not believe the patient is clinically in congestive heart failure. The patient has chronic diastolic congestive heart failure. I will continue his Lasix, beta blocker, and ACE inhibitor for now.  4.  History of chronic atrial fibrillation/atrial flutter. The patient is status post automatic implantable cardiac defibrillator. I will continue amiodarone and Toprol for rate control. Continue Coumadin although INR is supratherapeutic presently.  5.  Hypertension, presently hemodynamically stable. Continue metoprolol and lisinopril. 6.  Hypokalemia. I will go ahead and replace his potassium accordingly and repeat it in the morning. His magnesium level was normal.   CODE STATUS: The patient is a full code.   TIME SPENT: 50 minutes.      ____________________________ Belia Heman. Verdell Carmine, MD vjs:bu D: 10/19/2014 14:29:46 ET T: 10/19/2014 14:48:19 ET JOB#: [id number]  cc: Belia Heman. Verdell Carmine, MD, <Dictator> Henreitta Leber MD ELECTRONICALLY SIGNED 11/06/2014 12:47

## 2015-01-23 NOTE — Discharge Summary (Signed)
PATIENT NAME:  Darius Taylor, Darius Taylor MR#:  [id number] DATE OF BIRTH:  [birth year]  DATE OF ADMISSION:  10/19/2014 DATE OF DISCHARGE:  10/21/2014  ADMITTING DIAGNOSIS: Chronic obstructive pulmonary disease exacerbation.  DISCHARGE DIAGNOSES:  1.  Chronic obstructive pulmonary disease exacerbation. 2.  Acute bronchitis. 3.  Hemoptysis due to above. 4.  Coagulopathy due to Coumadin. 5.  Elevated troponin, likely demand ischemia. No myocardial infarction per cardiology.  6.  Coagulopathy due to Coumadin, dose adjusted.  7.  Acute on chronic diastolic congestive heart failure.  8.  Valvular heart disease with moderate mitral regurgitation, tricuspid regurgitation, moderate aortic valve stenosis, moderate elevation of pulmonary pressures, normal ejection fraction on echocardiogram during this admission.  9.  Malignant essential hypertension.   DISCHARGE CONDITION: Stable.   DISCHARGE MEDICATIONS: The patient was advised to continue: 1.  Lasix 20 mg p.o. daily. 2.  Magnesium oxide 400 mg p.o. twice daily. 3.  Vitamin D3 1000 units once daily. 4.  Amiodarone 200 mg p.o. daily.  5.  Nitroglycerin 0.4 mg sublingually as needed.  6.  Atorvastatin 80 mg p.o. at bedtime.  7.  Metoprolol succinate 200 mg p.o. daily. 8.  Symbicort 160/4.5, 2 puffs twice daily.  9.  Albuterol nebulizer 3 mL every 4-6 hours as needed. 10.  Proventil HFA 2 puffs every 4-6 hours. 11.  Colcrys 0.6 mg every 8 hours as needed. 12.  Prednisone taper 50 mg p.o. once, then taper by 10 mg daily until stopped.  13.  Lisinopril 40 mg p.o. twice daily.  14.  Coumadin 4 mg p.o. daily.  15. Dextromethorphan and guaifenesin 5 mL every 4 hours as needed. 16.  Levofloxacin 500 mg p.o. every 24 hours for 5 more days to complete course. 17.  The patient was advised not to take Coumadin on 10/21/2014, then start new dose at 4 mg daily. Not to take 5 mg daily dose and as recommended by primary care physician. The patient is to have his INR  checked in the next few days after discharge.   HOME OXYGEN: The patient is going to be discharged on 2 liters of oxygen through nasal cannula at rest as well as on exertion.  DIET: Two gram salt, low fat, low cholesterol, mechanical, soft consistency.   ACTIVITY LIMITATIONS:  As tolerated. Refer to home health physical therapy as well as R.N.    Malissa Hippo APPOINTMENT: [place] MD in 2 days after discharge, Dr. Nehemiah Massed in 1 week after discharge. The patient was advised to have his Coumadin level checked in the next 2-3 days after discharge to insure stability.  CONSULTANTS: Care management, social work, Corey Skains, MD.    RADIOLOGIC STUDIES: Chest x-ray, PA and lateral, 10/19/2014 revealing stable cardiomegaly, mild pulmonary vascular congestion and interstitial prominence, suggesting mild congestive heart failure, cardiac pacer in stable condition. Stable elevation of left hemidiaphragm. Echocardiogram, 10/20/2014, revealing left ventricular ejection fraction by visual estimation 55%-60%, normal global left ventricular systolic function, moderately dilated left as well as right atrium, moderate mitral valve regurgitation, moderate aortic valve stenosis, moderate tricuspid regurgitation, moderately elevated pulmonary arterial systolic pressure, moderately increased left ventricular posterior wall thickness.   HOSPITAL COURSE: The patient is a 69 year old male who presents to the hospital with complaints of worsening shortness of breath, as well as hemoptysis. Please refer to Dr. Cassian Jolly admission note on 10/19/2014. On arrival to the hospital, the patient was noted to be hypoxic, short of breath over the past 3-4 days. His oxygen saturations in the Emergency  Room were noted to be in mid to high 80s. He was noted also to have diffuse wheezing. He denied, however, any paroxysmal nocturnal dyspnea, orthopnea, or syncopal episodes. His vital signs on arrival to the hospital revealed  temperature of 98.8, pulse was 67, respiration rate was 20, blood pressure 179/123, saturation was 97% on 3 liters of oxygen by nasal cannula. The patient was in mild respiratory distress in the Emergency Room.  On physical exam, his lungs revealed coarse rhonchi and inspiratory as well as expiratory wheezing.   LABORATORY DATA: On arrival to the hospital showed elevated beta-type natriuretic peptide of 3218, sodium 147, potassium 3.4, otherwise BMP was unremarkable. The patient's bicarbonate level was high at 33. The patient's cardiac enzymes revealed elevation of troponin to 0.39 on the first set, 0.38 on the second, and 0.41 on the third set. White blood cell count was normal at 10.2.  Hemoglobin was 11.1, platelet count was 210,000, with mild left shift. The patient was also noted to be coagulopathic with pro time of 33.9, INR 3.5, activated PTT was elevated at 52.6.   The patient's EKG showed electronic atrial pacemaker course, possible inferior infarct which was already cited in 2014, and likely lateral ischemia according to T wave abnormality. The patient's chest x-ray was concerning for possible mild pulmonary congestion.   The patient was admitted to the hospital for further evaluation. He was diuresed. He was also initiated on antibiotic  therapy as well as steroids and inhalation therapy. With this, his condition improved and his oxygenation also improved. By 10/21/2014,  the patient's oxygen saturations were found to be 96% range.  The patient's temperature was 97.9, pulse was 69, respiration rate was 19, blood pressure ranging [id number] systolic and [id number] diastolic. Oxygen saturations were 96% on 2 liters of oxygen through nasal cannula at rest and 93% on 2 liters of oxygen per nasal cannula on exertion. The patient was felt to continue to benefit from continuation on antibiotic therapy as well as inhalation therapy and steroid taper.  He is to continue also diuretics and high dose of ACE  inhibitor. He is to follow up with his primary physician and Dr. Nehemiah Massed next few days after discharge.  Dr. Nehemiah Massed saw patient in consultation, and felt that the patient's mild elevation of troponin very likely is demand ischemia, but not acute coronary syndrome, and no further recommendations were made in regards to diagnostics or any further evaluation.  In regard to hemoptysis, it was felt to be due to bronchitis, cough, as well as coagulopathy. The patient's Coumadin was stopped and his INR was followed while he was in the hospital. The patient was advised to resume Coumadin at much lower doses, and he is not to take Coumadin on the day of discharge, 10/21/2014. The patient is to follow up with his primary physician and have his pro time checked as outpatient in next few days after discharge to ensure stability. The patient's hemoptysis subsided with holding his Coumadin.  In regard to acute on chronic diastolic congestive heart failure, the patient is to continue diuretics, as well as high metoprolol dose, and high ACE inhibitor dose. In regard to malignant hypertension, the patient's blood pressure medications were advanced to current doses. It is recommended to follow the patient's blood pressure readings and make decisions about advancement of his blood pressure medications even higher if needed. He is being discharged in stable condition with the above-mentioned medications and followup, as well as oxygen was prescribed for him upon  discharge.   In regards to his other chronic medical problems, the patient is to continue to use outpatient medications, no changes were made.  The patient is being discharged, as mentioned above, with the above-mentioned medications and followup.   TIME SPENT: 40 minutes.   ____________________________ Theodoro Grist, MD rv:LT D: 10/24/2014 13:45:52 ET T: 10/24/2014 20:55:14 ET JOB#: [id number]  cc: Theodoro Grist, MD, <Dictator> Corey Skains, MD [employer] MD ELECTRONICALLY SIGNED 11/04/2014 15:56

## 2015-01-23 NOTE — Consult Note (Signed)
PATIENT NAME:  Darius Taylor, Darius Taylor MR#:  [id number] DATE OF BIRTH:  1945/12/04  DATE OF CONSULTATION:  10/19/2014  REFERRING PHYSICIAN: Vivek J. Verdell Carmine, MD  CONSULTING PHYSICIAN: Corey Skains, MD  REASON FOR CONSULTATION: Shortness of breath and chest discomfort with known cardiovascular disease concerning for acute on chronic systolic dysfunction heart failure with elevated troponin.   CHIEF COMPLAINT: "I am short of breath."   HISTORY OF PRESENT ILLNESS: This is a 69 year old male with known coronary artery disease status post significant previous myocardial infarction and interventions with severe LV systolic dysfunction status post defibrillator placement with a significant progression of shortness of breath, cough, and congestion. The patient does have a cough and congestion consistent with possible pulmonary infection, but also has additional pulmonary edema consistent with acute on chronic systolic dysfunction congestive heart failure. The patient does have a minimal elevated troponin, most consistent with demand ischemia and not apparent acute coronary syndrome. EKG currently shows paced rhythm consistent with defibrillator pacer. The patient has had improvements of symptoms with oxygen as well as Lasix, and currently is improving at this time. There has been no evidence of significant true angina at this time.   REVIEW OF SYSTEMS: The remainder review of systems negative for vision change, ringing in the ears, hearing loss, heartburn, nausea, vomiting, diarrhea, bloody stools, stomach pain, extremity pain, leg weakness, cramping of the buttocks, known blood clots, headaches, blackouts, dizzy spells, nosebleeds, trouble swallowing, frequent urination, urination at night, muscle weakness, numbness, anxiety, depression, skin lesions, or skin rashes.   PAST MEDICAL HISTORY:  1.  Known coronary artery disease status post multiple myocardial infarctions.  2.  Chronic systolic dysfunction.  3.   Mixed hyperlipidemia.  4.  Essential hypertension.   FAMILY HISTORY: Multiple family members with early onset of cardiovascular disease as well as hypertension.   SOCIAL HISTORY: Currently denies alcohol or tobacco use.   ALLERGIES: As listed.   MEDICATIONS: As listed.   PHYSICAL EXAMINATION:  VITAL SIGNS: Blood pressure is 110/62 bilaterally. Heart rate is 70, upright, reclining, and regular.  GENERAL: He is a well-appearing male in no acute distress.  HEENT: No icterus, thyromegaly, ulcers, hemorrhage, or xanthelasma.  CARDIOVASCULAR: Regular rate and rhythm with normal S1 and S2 with inferior PMI, apical murmur consistent with mitral regurgitation. Carotid upstroke normal without bruit. Jugular venous pressure is normal.  LUNGS: Have bibasilar crackles with decreased breath sounds and some expiratory rhonchi.  ABDOMEN: Soft, nontender without hepatosplenomegaly or masses. Abdominal aorta is normal size without bruit.  EXTREMITIES: Show 2+ radial, femoral, dorsal pedal pulses with trace to 1+ lower extremity edema. No cyanosis, clubbing, or ulcers.  NEUROLOGIC: He is oriented to time, place, and person with normal mood and affect.   ASSESSMENT: A 69 year old male with known cardiovascular disease with multiple previous myocardial infarctions, acute on chronic systolic dysfunction, congestive heart failure, elevated troponin consistent with demand ischemia, essential hypertension, mixed hyperlipidemia with what appears to be pulmonary infection exacerbation needing further treatment options.   RECOMMENDATIONS:  1.  Continue possible prednisone and antibiotics for further risk reduction and infection or pulmonary inflammation.  2.  Lasix intravenous for pulmonary edema.  3.  Continue beta blocker, ACE inhibitor, as able for acute on chronic systolic dysfunction congestive heart failure.  4.  Consider echocardiogram for further left ventricular systolic dysfunction and adjustments of  medications.  5.  No further intervention of elevated troponin most consistent with demand ischemia and no current evidence of acute coronary syndrome.  6.  No change in mixed hyperlipidemia treatment for high intensity cholesterol therapy.  7.  Further ambulation and follow for improvements, but no current evidence of need for further cardiac diagnostics.    ____________________________ Corey Skains, MD bjk:bm D: 10/19/2014 23:11:50 ET T: 10/19/2014 23:30:48 ET JOB#: [id number]  cc: Corey Skains, MD, <Dictator> Corey Skains MD ELECTRONICALLY SIGNED 10/22/2014 8:36

## 2016-09-25 HISTORY — PX: CORONARY ANGIOPLASTY: SHX604

## 2016-09-28 ENCOUNTER — Emergency Department
Admission: EM | Admit: 2016-09-28 | Discharge: 2016-09-28 | Disposition: A | Discharge status: Other Acute Inpatient Hospital | Payer: Medicare HMO | Attending: Emergency Medicine | Admitting: Emergency Medicine

## 2016-09-28 ENCOUNTER — Encounter
Admission: EM | Disposition: A | Discharge status: Other Acute Inpatient Hospital | Payer: Self-pay | Source: Home / Self Care | Attending: Emergency Medicine

## 2016-09-28 ENCOUNTER — Encounter: Payer: Self-pay | Admitting: Anesthesiology

## 2016-09-28 ENCOUNTER — Emergency Department: Payer: Medicare HMO

## 2016-09-28 DIAGNOSIS — M79652 Pain in left thigh: Secondary | ICD-10-CM | POA: Diagnosis present

## 2016-09-28 DIAGNOSIS — Z859 Personal history of malignant neoplasm, unspecified: Secondary | ICD-10-CM | POA: Insufficient documentation

## 2016-09-28 DIAGNOSIS — Z87891 Personal history of nicotine dependence: Secondary | ICD-10-CM | POA: Insufficient documentation

## 2016-09-28 DIAGNOSIS — I10 Essential (primary) hypertension: Secondary | ICD-10-CM | POA: Diagnosis not present

## 2016-09-28 DIAGNOSIS — I4891 Unspecified atrial fibrillation: Secondary | ICD-10-CM | POA: Diagnosis not present

## 2016-09-28 DIAGNOSIS — I359 Nonrheumatic aortic valve disorder, unspecified: Secondary | ICD-10-CM | POA: Diagnosis not present

## 2016-09-28 DIAGNOSIS — J449 Chronic obstructive pulmonary disease, unspecified: Secondary | ICD-10-CM | POA: Diagnosis not present

## 2016-09-28 DIAGNOSIS — I2510 Atherosclerotic heart disease of native coronary artery without angina pectoris: Secondary | ICD-10-CM | POA: Diagnosis not present

## 2016-09-28 DIAGNOSIS — I70202 Unspecified atherosclerosis of native arteries of extremities, left leg: Secondary | ICD-10-CM

## 2016-09-28 DIAGNOSIS — Z79899 Other long term (current) drug therapy: Secondary | ICD-10-CM | POA: Diagnosis not present

## 2016-09-28 DIAGNOSIS — J45909 Unspecified asthma, uncomplicated: Secondary | ICD-10-CM | POA: Diagnosis not present

## 2016-09-28 DIAGNOSIS — IMO0001 Reserved for inherently not codable concepts without codable children: Secondary | ICD-10-CM

## 2016-09-28 DIAGNOSIS — Z95810 Presence of automatic (implantable) cardiac defibrillator: Secondary | ICD-10-CM | POA: Insufficient documentation

## 2016-09-28 DIAGNOSIS — I743 Embolism and thrombosis of arteries of the lower extremities: Secondary | ICD-10-CM | POA: Diagnosis not present

## 2016-09-28 DIAGNOSIS — M79605 Pain in left leg: Secondary | ICD-10-CM | POA: Diagnosis not present

## 2016-09-28 HISTORY — DX: Malignant (primary) neoplasm, unspecified: C80.1

## 2016-09-28 HISTORY — DX: Unspecified asthma, uncomplicated: J45.909

## 2016-09-28 HISTORY — DX: Essential (primary) hypertension: I10

## 2016-09-28 HISTORY — DX: Chronic obstructive pulmonary disease, unspecified: J44.9

## 2016-09-28 LAB — COMPREHENSIVE METABOLIC PANEL
ALK PHOS: 78 U/L (ref 38–126)
ALT: 41 U/L (ref 17–63)
AST: 74 U/L — ABNORMAL HIGH (ref 15–41)
Albumin: 3.5 g/dL (ref 3.5–5.0)
Anion gap: 9 (ref 5–15)
BUN: 24 mg/dL — ABNORMAL HIGH (ref 6–20)
CALCIUM: 9 mg/dL (ref 8.9–10.3)
CO2: 33 mmol/L — AB (ref 22–32)
CREATININE: 1.39 mg/dL — AB (ref 0.61–1.24)
Chloride: 98 mmol/L — ABNORMAL LOW (ref 101–111)
GFR, EST AFRICAN AMERICAN: 57 mL/min — AB (ref >60)
GFR, EST NON AFRICAN AMERICAN: 49 mL/min — AB (ref >60)
Glucose, Bld: 152 mg/dL — ABNORMAL HIGH (ref 65–99)
Potassium: 2.9 mmol/L — ABNORMAL LOW (ref 3.5–5.1)
Sodium: 140 mmol/L (ref 135–145)
Total Bilirubin: 1.1 mg/dL (ref 0.3–1.2)
Total Protein: 8 g/dL (ref 6.5–8.1)

## 2016-09-28 LAB — CBC
HEMATOCRIT: 32.7 % — AB (ref 40.0–52.0)
HEMOGLOBIN: 11 g/dL — AB (ref 13.0–18.0)
MCH: 28.5 pg (ref 26.0–34.0)
MCHC: 33.8 g/dL (ref 32.0–36.0)
MCV: 84.4 fL (ref 80.0–100.0)
Platelets: 205 10*3/uL (ref 150–440)
RBC: 3.87 MIL/uL — AB (ref 4.40–5.90)
RDW: 17.4 % — ABNORMAL HIGH (ref 11.5–14.5)
WBC: 6.7 10*3/uL (ref 3.8–10.6)

## 2016-09-28 LAB — PROTIME-INR
INR: 1.05
PROTHROMBIN TIME: 13.7 s (ref 11.4–15.2)

## 2016-09-28 LAB — TROPONIN I: Troponin I: 0.22 ng/mL (ref <0.03)

## 2016-09-28 SURGERY — CANCELLED PROCEDURE
Anesthesia: General

## 2016-09-28 SURGERY — EMBOLECTOMY
Anesthesia: General

## 2016-09-28 MED ORDER — HEPARIN (PORCINE) IN NACL 100-0.45 UNIT/ML-% IJ SOLN: 16.0000 [IU]/kg/h | Freq: Once | INTRAMUSCULAR | Status: DC

## 2016-09-28 MED ORDER — MORPHINE SULFATE (PF) 4 MG/ML IV SOLN: 4.0000 mg | Freq: Once | INTRAVENOUS | Status: DC

## 2016-09-28 MED ORDER — HEPARIN SODIUM (PORCINE) 5000 UNIT/ML IJ SOLN: 4000.0000 [IU] | Freq: Once | INTRAMUSCULAR | Status: DC

## 2016-09-28 MED ORDER — IOPAMIDOL (ISOVUE-370) INJECTION 76%
125.0000 mL | Freq: Once | INTRAVENOUS | Status: AC | PRN
  Administered 2016-09-28: 125 mL via INTRAVENOUS

## 2016-09-28 MED ORDER — HEPARIN BOLUS VIA INFUSION
5000.0000 [IU] | Freq: Once | INTRAVENOUS | Status: AC
  Administered 2016-09-28: 5000 [IU] via INTRAVENOUS
  Filled 2016-09-28: qty 5000

## 2016-09-28 MED ORDER — MORPHINE SULFATE (PF) 4 MG/ML IV SOLN
INTRAVENOUS | Status: AC
  Filled 2016-09-28: qty 1

## 2016-09-28 MED ORDER — ONDANSETRON HCL 4 MG/2ML IJ SOLN
INTRAMUSCULAR | Status: AC
  Filled 2016-09-28: qty 2

## 2016-09-28 MED ORDER — ONDANSETRON HCL 4 MG/2ML IJ SOLN: 4.0000 mg | Freq: Once | INTRAMUSCULAR | Status: DC

## 2016-09-28 MED ORDER — HEPARIN (PORCINE) IN NACL 100-0.45 UNIT/ML-% IJ SOLN
1300.0000 [IU]/h | INTRAMUSCULAR | Status: DC
  Administered 2016-09-28: 1300 [IU]/h via INTRAVENOUS
  Filled 2016-09-28: qty 250

## 2016-09-28 SURGICAL SUPPLY — 61 items
APPLIER CLIP 11 MED OPEN (CLIP)
APPLIER CLIP 13 LRG OPEN (CLIP)
APPLIER CLIP 9.375 SM OPEN (CLIP)
BAG DECANTER FOR FLEXI CONT (MISCELLANEOUS) IMPLANT
BAG ISOLATATION DRAPE 20X20 ST (DRAPES) IMPLANT
BLADE SURG SZ11 CARB STEEL (BLADE) IMPLANT
BOOT SUTURE AID YELLOW STND (SUTURE) IMPLANT
BRUSH SCRUB 4% CHG (MISCELLANEOUS) IMPLANT
CANISTER SUCT 1200ML W/VALVE (MISCELLANEOUS) IMPLANT
CLIP APPLIE 11 MED OPEN (CLIP) IMPLANT
CLIP APPLIE 13 LRG OPEN (CLIP) IMPLANT
CLIP APPLIE 9.375 SM OPEN (CLIP) IMPLANT
DERMABOND ADVANCED (GAUZE/BANDAGES/DRESSINGS)
DERMABOND ADVANCED .7 DNX12 (GAUZE/BANDAGES/DRESSINGS) IMPLANT
DRAPE IMP U-DRAPE 54X76 (DRAPES) IMPLANT
DRAPE INCISE IOBAN 66X45 STRL (DRAPES) IMPLANT
DRAPE ISOLATE BAG 20X20 STRL (DRAPES)
DRAPE SHEET LG 3/4 BI-LAMINATE (DRAPES) IMPLANT
DRESSING SURGICEL FIBRLLR 1X2 (HEMOSTASIS) IMPLANT
DRSG SURGICEL FIBRILLAR 1X2 (HEMOSTASIS)
DURAPREP 26ML APPLICATOR (WOUND CARE) IMPLANT
ELECT CAUTERY BLADE 6.4 (BLADE) IMPLANT
ELECT REM PT RETURN 9FT ADLT (ELECTROSURGICAL)
ELECTRODE REM PT RTRN 9FT ADLT (ELECTROSURGICAL) IMPLANT
GLOVE SURG SYN 8.0 (GLOVE) IMPLANT
GOWN STRL REUS W/ TWL LRG LVL3 (GOWN DISPOSABLE) IMPLANT
GOWN STRL REUS W/ TWL XL LVL3 (GOWN DISPOSABLE) IMPLANT
GOWN STRL REUS W/TWL LRG LVL3 (GOWN DISPOSABLE)
GOWN STRL REUS W/TWL XL LVL3 (GOWN DISPOSABLE)
IV NS 500ML (IV SOLUTION)
IV NS 500ML BAXH (IV SOLUTION) IMPLANT
KIT RM TURNOVER STRD PROC AR (KITS) IMPLANT
LABEL OR SOLS (LABEL) IMPLANT
LOOP RED MAXI  1X406MM (MISCELLANEOUS)
LOOP VESSEL MAXI 1X406 RED (MISCELLANEOUS) IMPLANT
LOOP VESSEL MINI 0.8X406 BLUE (MISCELLANEOUS) IMPLANT
LOOPS BLUE MINI 0.8X406MM (MISCELLANEOUS)
NEEDLE HYPO 18GX1.5 BLUNT FILL (NEEDLE) IMPLANT
NS IRRIG 1000ML POUR BTL (IV SOLUTION) IMPLANT
PACK BASIN MAJOR ARMC (MISCELLANEOUS) IMPLANT
PACK UNIVERSAL (MISCELLANEOUS) IMPLANT
STOCKINETTE M/LG 89821 (MISCELLANEOUS) IMPLANT
SUT MNCRL+ 5-0 UNDYED PC-3 (SUTURE) IMPLANT
SUT MONOCRYL 5-0 (SUTURE)
SUT PROLENE 5 0 RB 1 DA (SUTURE) IMPLANT
SUT PROLENE 6 0 BV (SUTURE) IMPLANT
SUT SILK 2 0 (SUTURE)
SUT SILK 2-0 18XBRD TIE 12 (SUTURE) IMPLANT
SUT SILK 3 0 (SUTURE)
SUT SILK 3-0 18XBRD TIE 12 (SUTURE) IMPLANT
SUT SILK 4 0 (SUTURE)
SUT SILK 4-0 18XBRD TIE 12 (SUTURE) IMPLANT
SUT VIC AB 2-0 CT1 27 (SUTURE)
SUT VIC AB 2-0 CT1 TAPERPNT 27 (SUTURE) IMPLANT
SUT VIC AB 3-0 SH 27 (SUTURE)
SUT VIC AB 3-0 SH 27X BRD (SUTURE) IMPLANT
SUT VICRYL+ 3-0 36IN CT-1 (SUTURE) IMPLANT
SYR 20CC LL (SYRINGE) IMPLANT
SYR 3ML LL SCALE MARK (SYRINGE) IMPLANT
SYR TB 1ML LUER SLIP (SYRINGE) IMPLANT
SYRINGE 10CC LL (SYRINGE) IMPLANT

## 2016-09-28 NOTE — ED Triage Notes (Signed)
Pt bib EMS from home w/ c/o L leg pain and coldness.  Pts L leg noticeably colder, pulses not heard on doppler.  Pt sts that he stopped blood thinner Friday in prep for surgery

## 2016-09-28 NOTE — ED Provider Notes (Signed)
[facility] Emergency Department Provider Note    First MD Initiated Contact with Patient 09/28/16 [phone]     (approximate)  I have reviewed the triage vital signs and the nursing notes.   HISTORY  Chief Complaint Cold Extremity    HPI Darius Taylor is a 71 y.o. male with history of atrial fibrillation on anticoagulation however stopped secondary to planned surgery for  valvular heart disease next Tuesday at [facility]  presents to the emergency department with abrupt onset of proximal left thigh pain at 1:00 AM. Patient then admitted to left leg numbness extending down to the foot. Patient states his current pain score is 10 out of 10.   Past medical history Valvular heart disease  There are no active problems to display for this patient.   Past Surgical History:  Procedure Laterality Date  . CARDIAC DEFIBRILLATOR PLACEMENT    . CORONARY ANGIOPLASTY  09/25/2016  . KIDNEY SURGERY     left removed    Prior to Admission medications   Medication Sig Start Date End Date Taking? Authorizing Provider  albuterol (PROVENTIL) (2.5 MG/3ML) 0.083% nebulizer solution Take 2.5 mg by nebulization every 6 (six) hours as needed for wheezing or shortness of breath.   Yes Historical Provider, MD  amiodarone (PACERONE) 200 MG tablet Take 200 mg by mouth daily.   Yes Historical Provider, MD  apixaban (ELIQUIS) 5 MG TABS tablet Take 5 mg by mouth 2 (two) times daily.   Yes Historical Provider, MD  atorvastatin (LIPITOR) 80 MG tablet Take 80 mg by mouth daily.   Yes Historical Provider, MD  budesonide-formoterol (SYMBICORT) 160-4.5 MCG/ACT inhaler Inhale 2 puffs into the lungs 2 (two) times daily.   Yes Historical Provider, MD  cholecalciferol (VITAMIN D) 1000 units tablet Take 1,000 Units by mouth daily.   Yes Historical Provider, MD  furosemide (LASIX) 40 MG tablet Take 40 mg by mouth 2 (two) times daily.   Yes Historical Provider, MD  lisinopril (PRINIVIL,ZESTRIL) 40 MG  tablet Take 40 mg by mouth daily.   Yes Historical Provider, MD  magnesium oxide (MAG-OX) 400 MG tablet Take 400 mg by mouth 2 (two) times daily.   Yes Historical Provider, MD  metoprolol (TOPROL-XL) 200 MG 24 hr tablet Take 200 mg by mouth daily.   Yes Historical Provider, MD  nitroGLYCERIN (NITROSTAT) 0.4 MG SL tablet Place 0.4 mg under the tongue every 5 (five) minutes as needed for chest pain.   Yes Historical Provider, MD    Allergies Patient has no known allergies.  No family history on file.  Social History Social History  Substance Use Topics  . Smoking status: Former Research scientist (life sciences)  . Smokeless tobacco: Never Used  . Alcohol use No    Review of Systems Constitutional: No fever/chills Eyes: No visual changes. ENT: No sore throat. Cardiovascular: Denies chest pain. Respiratory: Denies shortness of breath. Gastrointestinal: No abdominal pain.  No nausea, no vomiting.  No diarrhea.  No constipation. Genitourinary: Negative for dysuria. Musculoskeletal: Negative for back pain. Positive for left leg pain and numbness Skin: Negative for rash. Neurological: Negative for headaches, focal weakness or numbness.  10-point ROS otherwise negative.  ____________________________________________   PHYSICAL EXAM:  VITAL SIGNS: ED Triage Vitals [09/28/16 0239]  Enc Vitals Group     BP      Pulse Rate 64     Resp (!) 107     Temp      Temp src      SpO2 95 %  Weight 213 lb (96.6 kg)     Height _0  (1.778 m)     Head Circumference      Peak Flow      Pain Score      Pain Loc      Pain Edu?      Excl. in [place]?     Constitutional: Alert and oriented. Apparent discomfort  Eyes: Conjunctivae are normal. PERRL. EOMI. Head: Atraumatic. Mouth/Throat: Mucous membranes are moist.  Oropharynx non-erythematous. Neck: No stridor.   Cardiovascular: Normal rate, regular rhythm. Cannot palpate or Doppler left popliteal, posterior tibial, or dorsalis pedis pulse Respiratory : Normal  respiratory rate, CTAB. Gastrointestinal: Soft and nontender. No distention.  Musculoskeletal: Left leg cold to touch, pallor comparison to right leg.  Neurologic:  Normal speech and language. No gross focal neurologic deficits are appreciated.  Skin:  Cold left leg in comparison to right leg No rash noted. Psychiatric: Mood and affect are normal. Speech and behavior are normal.  ____________________________________________   LABS (all labs ordered are listed, but only abnormal results are displayed)  Labs Reviewed  CBC - Abnormal; Notable for the following:       Result Value   RBC 3.87 (*)    Hemoglobin 11.0 (*)    HCT 32.7 (*)    RDW 17.4 (*)    All other components within normal limits  COMPREHENSIVE METABOLIC PANEL - Abnormal; Notable for the following:    Potassium 2.9 (*)    Chloride 98 (*)    CO2 33 (*)    Glucose, Bld 152 (*)    BUN 24 (*)    Creatinine, Ser 1.39 (*)    AST 74 (*)    GFR calc non Af Amer 49 (*)    GFR calc Af Amer 57 (*)    All other components within normal limits  TROPONIN I - Abnormal; Notable for the following:    Troponin I 0.22 (*)    All other components within normal limits  PROTIME-INR  APTT  PROTIME-INR  HEPARIN LEVEL (UNFRACTIONATED)  HEPARIN LEVEL (UNFRACTIONATED)   ____________________________________________  EKG  ED ECG REPORT I, [place] N BROWN, the attending physician, personally viewed and interpreted this ECG.   Date: 09/28/2016  EKG Time: 2:39 AM  Rate: 60  Rhythm: Atrial paced rhythm  Axis: Normal  Intervals: Normal  ST&T Change: None  ____________________________________________  RADIOLOGY I, [place] N BROWN, personally viewed and evaluated these images (plain radiographs) as part of my medical decision making, as well as reviewing the written report by the radiologist.  Ct Angio Ao+bifem W &/or Wo Contrast  Result Date: 09/28/2016 CLINICAL DATA:  Cold pulseless left leg. Stopped anticoagulation one week  ago. EXAM: CT ANGIOGRAPHY OF ABDOMINAL AORTA WITH ILIOFEMORAL RUNOFF TECHNIQUE: Multidetector CT imaging of the abdomen, pelvis and lower extremities was performed using the standard protocol during bolus administration of intravenous contrast. Multiplanar CT image reconstructions and MIPs were obtained to evaluate the vascular anatomy. CONTRAST:  125 mL Isovue 370 intravenous COMPARISON:  11/15/2008 FINDINGS: VASCULAR Aorta: Normal caliber aorta without aneurysm or dissection. Extensive atherosclerotic calcification. No significant stenosis. Celiac: Patent, extensively calcified. No aneurysm or significant stenosis. SMA: Patent, extensively calcified. No aneurysm or significant stenosis. Renals: Single right renal artery, heavily calcified but patent. No aneurysm or stenosis. Left nephrectomy. IMA: Occluded at its origin RIGHT Lower Extremity Inflow: Right common, internal and external iliac arteries are patent without aneurysm or significant stenosis. Extensive atherosclerotic calcification. Outflow: Right common femoral artery and  femoral bifurcation are patent, with moderate atherosclerotic calcification. Right superficial and profunda femoral arteries are patent, extensively calcified. Right popliteal artery is patent, heavily calcified. Runoff: The runoff vessels are heavily calcified and evaluation is limited. They probably are patent. LEFT Lower Extremity Inflow: Left common iliac artery is patent without aneurysm or dissection. It is heavily calcified. Left internal iliac artery is occluded at its origin. Left external iliac artery is patent, heavily calcified. No aneurysm or stenosis. Outflow: Left common femoral artery is occluded just above the bifurcation. Left superficial femoral artery is occluded throughout its entire length. Left profunda femoral artery is occluded at its origin but reconstitutes from collaterals. Left popliteal artery is occluded throughout its length. Runoff: Left runoff vessels  are occluded proximally. Mid to distal patency is indeterminate due to the heavy calcification. Veins: No obvious venous abnormality within the limitations of this arterial phase study. Review of the MIP images confirms the above findings. NON-VASCULAR Lower chest: No acute abnormality. Hepatobiliary: 3 cm focal hypodense lesion at the lateral periphery of the right hepatic lobe, unchanged from 2010. This can be presumed benign due to long-term stability. No suspicious focal liver lesion. Gallbladder and bile ducts are unremarkable. Pancreas: Unremarkable. No pancreatic ductal dilatation or surrounding inflammatory changes. Spleen: Normal in size without focal abnormality. Adrenals/Urinary Tract: Both adrenals are normal. Right kidney exhibits a mild parenchymal scarring but no suspicious parenchymal lesions. Right collecting system and ureter are unremarkable. There are unremarkable appearances of the left nephrectomy bed. There are morphologic irregularities of the urinary bladder consistent with the described history of treated bladder cancer. No urinary bladder mass evident. Stomach/Bowel: Stomach and small bowel are unremarkable. Prior sigmoidectomy with unremarkable anastomosis. Colon is otherwise unremarkable. Lymphatic: No adenopathy in the abdomen or pelvis. Reproductive: Unremarkable Other: No acute findings in the abdomen or pelvis. Musculoskeletal: No significant skeletal lesion. IMPRESSION: VASCULAR 1. The arteries are normal in caliber but heavily calcified throughout. 2. 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 Occluded left profunda origin with reconstitution from collaterals. Left runoff vessels are occluded proximally. Runoff patency in the mid to distal lower leg is indeterminate due to the extensive heavy calcification. There also is occlusion of the left internal iliac artery  at its origin. 3. Patency of the right lower extremity arteries, although evaluation of the mid to distal runoff vasculature is indeterminate due to extensive heavy calcification. NON-VASCULAR 1. Stable low-attenuation liver lesion, benign due to long-term stability. 2. Unremarkable appearances of the left nephrectomy bed. 3. Unremarkable appearances of the colon anastomosis. 4. No acute findings in the abdomen or pelvis. Electronically Signed   By: Andreas Newport M.D.   On: 09/28/2016 04:02     Procedures   Critical Care performed: CRITICAL CARE Performed by: Gregor Hams   Total critical care time: 60 minutes  Critical care time was exclusive of separately billable procedures and treating other patients.  Critical care was necessary to treat or prevent imminent or life-threatening deterioration.  Critical care was time spent personally by me on the following activities: development of treatment plan with patient and/or surrogate as well as nursing, discussions with consultants, evaluation of patient's response to treatment, examination of patient, obtaining history from patient or surrogate, ordering and performing treatments and interventions, ordering and review of laboratory studies, ordering and review of radiographic studies, pulse oximetry and re-evaluation of patient's condition.  ____________________________________________   INITIAL  IMPRESSION / ASSESSMENT AND PLAN / ED COURSE  Pertinent labs & imaging results that were available during my care of the patient were reviewed by me and considered in my medical decision making (see chart for details).  Patient received heparin bolus and heparin infusion    71 year old male presents to the emergency department with abrupt onset of proximal left thigh pain followed by numbness to his left leg with onset at 1:00 AM this morning. On arrival to the emergency department I met the patient when he arrives to the treatment room. I  could not palpate a left popliteal, posterior tibial or dorsalis pedis pulse. I subsequently attempted to obtain pulse via Doppler and was unable to auscultate PT and DP. As such markedly concern for arterial occlusion hence CT angiogram was performed of the patient's left lower extremity which revealed above-mentioned. Dr. Delana Meyer vascular surgeon on call was notified and presented to the emergency department and evaluated patient. Dr. Delana Meyer and Dr. Ronelle Nigh vascular surgeon and anesthesiologist respectively concluded that the patient will require general anesthesia and stated that the patient needed to be transferred to [facility] where he was scheduled to have his valvular surgery performed as they deem that he would be considered a surgical risk performed here at [facility]. Immediately after being notified by Dr. Delana Meyer and Ronelle Nigh I called [facility] vascular surgery spoke with Dr. Berdine Addison accepted the patient in transfer ED the ED. I then spoke with Dr.Migliaccio ED physician at [facility] who accepted the patient in transfer. [place] [facility] was unable to accommodate with air transportation as their aircraft was in use. [facility] is unable to facilitate as well. As such local EMS was asked and we'll oblige and transport.   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[place])     MEDICATIONS GIVEN DURING THIS VISIT:  Medications  heparin ADULT infusion 100 units/mL (25000 units/282m sodium chloride 0.45%) (1,300 Units/hr Intravenous Transfusing/Transfer 09/28/16 0525)  morphine 4 MG/ML injection 4 mg (not administered)  ondansetron (ZOFRAN) injection 4 mg (not administered)  iopamidol (ISOVUE-370) 76 % injection 125 mL (125 mLs Intravenous Contrast Given 09/28/16 0255)  heparin bolus via infusion 5,000 Units (5,000 Units Intravenous Bolus from Bag 09/28/16 0357)     NEW OUTPATIENT MEDICATIONS  STARTED DURING THIS VISIT:  Discharge Medication List as of 09/28/2016  5:26 AM      Discharge Medication List as of 09/28/2016  5:26 AM      Discharge Medication List as of 09/28/2016  5:26 AM       Note:  This document was prepared using Dragon voice recognition software and may include unintentional dictation errors.    RGregor Hams MD 09/28/16 [phone]

## 2016-09-28 NOTE — ED Notes (Signed)
Patient transported to CT 

## 2016-09-28 NOTE — Progress Notes (Signed)
ANTICOAGULATION CONSULT NOTE - Initial Consult  Pharmacy Consult for heparin Indication: DVT  No Known Allergies  Patient Measurements: Height: 5\' 10"  (177.8 cm) Weight: 213 lb (96.6 kg) IBW/kg (Calculated) : 73 Heparin Dosing Weight: 92.9 kg  Vital Signs: Temp: 97.9 F (36.6 C) (01/05 0247) Temp Source: Oral (01/05 0247) BP: 107/84 (01/05 0244) Pulse Rate: 60 (01/05 0244)  Labs:  Recent Labs  09/28/16 0241  HGB 11.0*  HCT 32.7*  PLT 205  LABPROT 13.7  INR 1.05  CREATININE 1.39*  TROPONINI 0.22*    Estimated Creatinine Clearance: 56 mL/min (by C-G formula based on SCr of 1.39 mg/dL (H)).   Medical History: Past Medical History:  Diagnosis Date  . Asthma   . Cancer [facility])    colon, bladder and kidney  . COPD (chronic obstructive pulmonary disease) ([place])   . Hypertension     Medications:  Infusions:  . heparin      Assessment: 70 yom with cold extremities and lack of pulses on doppler. Pharmacy consulted for VTE treatment/UFH dosing. Note, patient reports stopping anticoagulant on Friday in prep for surgery. Will get baseline aPTT/PTINR/HL.  Goal of Therapy:  Heparin level 0.3-0.7 units/ml Monitor platelets by anticoagulation protocol: Yes   Plan:  Give 5000 units bolus x 1 Start heparin infusion at 1300 units/hr Check anti-Xa level in 8 hours and daily while on heparin Continue to monitor H&H and platelets  Laural Benes, Pharm.D., BCPS Clinical Pharmacist 09/28/2016,3:46 AM

## 2016-09-28 NOTE — H&P (Signed)
[facility] Admission History & Physical  MRN : [id number]  Darius Taylor is a 71 y.o. (1945-10-29) male who presents with chief complaint of  Chief Complaint  Patient presents with  . Cold Extremity  .  History of Present Illness: I am asked to evaluate Darius Taylor in the emergency room by Dr. Owens Shark. The patient presented earlier this evening with the abrupt onset of pain in his left lower extremity. He is very clear the pain began approximately 1:00 in the morning.  At this point he rates his pain as an 8-10 out of 10. It is continuous. He notes that his foot feels numb but his thigh is the location of the pain.  The patient reports that he is scheduled to undergo replacement of a heart valve in several days. By chart review he has severe aortic stenosis as well as atrial fibrillation and coronary artery disease as well as cardiomyopathy with a reduced ejection fraction. His recent evaluation was noted to say that he is a poor surgical candidate and therefore a percutaneous approach is being recommended. In preparation for the surgery he had stopped his anticoagulation. Apparently he had not been bridged with Lovenox.  Current Facility-Administered Medications  Medication Dose Route Frequency Provider Last Rate Last Dose  . heparin ADULT infusion 100 units/mL (25000 units/259mL sodium chloride 0.45%)  1,300 Units/hr Intravenous Continuous Gregor Hams, MD 13 mL/hr at 09/28/16 0356 1,300 Units/hr at 09/28/16 0356  . morphine 4 MG/ML injection           . ondansetron (ZOFRAN) 4 MG/2ML injection            No current outpatient prescriptions on file.    Past Medical History:  Diagnosis Date  . Asthma   . Cancer [facility])    colon, bladder and kidney  . COPD (chronic obstructive pulmonary disease) ([place])   . Hypertension     Past Surgical History:  Procedure Laterality Date  . CARDIAC DEFIBRILLATOR PLACEMENT    . CORONARY ANGIOPLASTY  09/25/2016  . KIDNEY  SURGERY     left removed    Social History Social History  Substance Use Topics  . Smoking status: Former Research scientist (life sciences)  . Smokeless tobacco: Never Used  . Alcohol use No    Family History No family history on file. No family history of bleeding/clotting disorders, porphyria or autoimmune disease  No Known Allergies   REVIEW OF SYSTEMS (Negative unless checked)  Constitutional: [] Weight loss  [] Fever  [] Chills Cardiac: [x] Chest pain   [] Chest pressure   [] Palpitations   [] Shortness of breath when laying flat   [] Shortness of breath at rest   [x] Shortness of breath with exertion. Vascular:  [] Pain in legs with walking   [x] Pain in legs at rest   [] Pain in legs when laying flat   [] Claudication   [] Pain in feet when walking  [] Pain in feet at rest  [] Pain in feet when laying flat   [] History of DVT   [] Phlebitis   [] Swelling in legs   [] Varicose veins   [] Non-healing ulcers Pulmonary:   [] Uses home oxygen   [] Productive cough   [] Hemoptysis   [] Wheeze  [] COPD   [] Asthma Neurologic:  [] Dizziness  [] Blackouts   [] Seizures   [] History of stroke   [] History of TIA  [] Aphasia   [] Temporary blindness   [] Dysphagia   [] Weakness or numbness in arms   [] Weakness or numbness in legs Musculoskeletal:  [] Arthritis   [] Joint swelling   [] Joint  pain   [] Low back pain Hematologic:  [] Easy bruising  [] Easy bleeding   [] Hypercoagulable state   [] Anemic  [] Hepatitis Gastrointestinal:  [] Blood in stool   [] Vomiting blood  [] Gastroesophageal reflux/heartburn   [] Difficulty swallowing. Genitourinary:  [] Chronic kidney disease   [] Difficult urination  [] Frequent urination  [] Burning with urination   [] Blood in urine Skin:  [] Rashes   [] Ulcers   [] Wounds Psychological:  [] History of anxiety   []  History of major depression.  Physical Examination  Vitals:   09/28/16 0239 09/28/16 0244 09/28/16 0247  BP:  107/84   Pulse: 64 60   Resp: (!) 107 16   Temp:   97.9 F (36.6 C)  TempSrc:   Oral  SpO2: 95% 96%    Weight: 213 lb (96.6 kg)    Height: 5\' 10"  (1.778 m)     Body mass index is 30.56 kg/m. Gen: WD/WN, NAD Head: [place]/AT, No temporalis wasting.  Ear/Nose/Throat: Hearing grossly intact, nares w/o erythema or drainage, oropharynx w/o Erythema/Exudate, Eyes: Sclera non-icteric, conjunctiva clear Neck: Supple, no nuchal rigidity.  No JVD.  Pulmonary:  Good air movement, no increased work of respiration or use of accessory muscles  Cardiac: Irregularly irregular, normal S1, S2, harsh 5/6 systolic ejection Murmurs, no rubs or gallops. Vascular: Left leg is cold to the touch pale in color and there is little capillary refill Vessel Right Left  Radial Palpable Palpable  Ulnar Palpable Palpable  Brachial Palpable Palpable  Carotid Palpable, without bruit Palpable, without bruit  Aorta Not palpable N/A  Femoral Palpable Not Palpable  Popliteal Palpable Not Palpable  PT Trace Palpable Not Palpable  DP Trace Palpable Not Palpable   Gastrointestinal: soft, non-tender/non-distended. No guarding/reflex. No masses, surgical incisions, or scars. Musculoskeletal: M/S 5/5 throughout Bilateral upper and right lower. Patient is unable to wiggle his toes and has minimal dorsiflexion of his foot on the left  No deformity or atrophy.  Trace edema Neurologic: Sensation grossly intact in Bilateral upper and right lower extremities.  Sensation is diminished in the left lower extremity Symmetrical.  Speech is fluent. Motor exam as listed above. Psychiatric: Judgment intact, Mood & affect appropriate for pt's clinical situation. Dermatologic: No rashes or ulcers noted.  No cellulitis or open wounds. Lymph : No Cervical, Axillary, or Inguinal lymphadenopathy.      CBC Lab Results  Component Value Date   WBC 6.7 09/28/2016   HGB 11.0 (L) 09/28/2016   HCT 32.7 (L) 09/28/2016   MCV 84.4 09/28/2016   PLT 205 09/28/2016    BMET    Component Value Date/Time   NA 140 09/28/2016 0241   NA 143 10/20/2014  0418   K 2.9 (L) 09/28/2016 0241   K 3.6 10/20/2014 0418   CL 98 (L) 09/28/2016 0241   CL 103 10/20/2014 0418   CO2 33 (H) 09/28/2016 0241   CO2 33 (H) 10/20/2014 0418   GLUCOSE 152 (H) 09/28/2016 0241   GLUCOSE 136 (H) 10/20/2014 0418   BUN 24 (H) 09/28/2016 0241   BUN 20 (H) 10/20/2014 0418   CREATININE 1.39 (H) 09/28/2016 0241   CREATININE 1.07 10/20/2014 0418   CALCIUM 9.0 09/28/2016 0241   CALCIUM 9.2 10/20/2014 0418   GFRNONAA 49 (L) 09/28/2016 0241   GFRNONAA >60 10/20/2014 0418   GFRNONAA >60 04/16/2013 0438   GFRAA 57 (L) 09/28/2016 0241   GFRAA >60 10/20/2014 0418   GFRAA >60 04/16/2013 0438   Estimated Creatinine Clearance: 56 mL/min (by C-G formula based on SCr of  1.39 mg/dL (H)).  COAG Lab Results  Component Value Date   INR 1.05 09/28/2016   INR 3.2 10/21/2014   INR 3.2 10/20/2014    Radiology Ct Angio Ao+bifem W &/or Wo Contrast  Result Date: 09/28/2016 CLINICAL DATA:  Cold pulseless left leg. Stopped anticoagulation one week ago. EXAM: CT ANGIOGRAPHY OF ABDOMINAL AORTA WITH ILIOFEMORAL RUNOFF TECHNIQUE: Multidetector CT imaging of the abdomen, pelvis and lower extremities was performed using the standard protocol during bolus administration of intravenous contrast. Multiplanar CT image reconstructions and MIPs were obtained to evaluate the vascular anatomy. CONTRAST:  125 mL Isovue 370 intravenous COMPARISON:  11/15/2008 FINDINGS: VASCULAR Aorta: Normal caliber aorta without aneurysm or dissection. Extensive atherosclerotic calcification. No significant stenosis. Celiac: Patent, extensively calcified. No aneurysm or significant stenosis. SMA: Patent, extensively calcified. No aneurysm or significant stenosis. Renals: Single right renal artery, heavily calcified but patent. No aneurysm or stenosis. Left nephrectomy. IMA: Occluded at its origin RIGHT Lower Extremity Inflow: Right common, internal and external iliac arteries are patent without aneurysm or significant  stenosis. Extensive atherosclerotic calcification. Outflow: Right common femoral artery and femoral bifurcation are patent, with moderate atherosclerotic calcification. Right superficial and profunda femoral arteries are patent, extensively calcified. Right popliteal artery is patent, heavily calcified. Runoff: The runoff vessels are heavily calcified and evaluation is limited. They probably are patent. LEFT Lower Extremity Inflow: Left common iliac artery is patent without aneurysm or dissection. It is heavily calcified. Left internal iliac artery is occluded at its origin. Left external iliac artery is patent, heavily calcified. No aneurysm or stenosis. Outflow: Left common femoral artery is occluded just above the bifurcation. Left superficial femoral artery is occluded throughout its entire length. Left profunda femoral artery is occluded at its origin but reconstitutes from collaterals. Left popliteal artery is occluded throughout its length. Runoff: Left runoff vessels are occluded proximally. Mid to distal patency is indeterminate due to the heavy calcification. Veins: No obvious venous abnormality within the limitations of this arterial phase study. Review of the MIP images confirms the above findings. NON-VASCULAR Lower chest: No acute abnormality. Hepatobiliary: 3 cm focal hypodense lesion at the lateral periphery of the right hepatic lobe, unchanged from 2010. This can be presumed benign due to long-term stability. No suspicious focal liver lesion. Gallbladder and bile ducts are unremarkable. Pancreas: Unremarkable. No pancreatic ductal dilatation or surrounding inflammatory changes. Spleen: Normal in size without focal abnormality. Adrenals/Urinary Tract: Both adrenals are normal. Right kidney exhibits a mild parenchymal scarring but no suspicious parenchymal lesions. Right collecting system and ureter are unremarkable. There are unremarkable appearances of the left nephrectomy bed. There are morphologic  irregularities of the urinary bladder consistent with the described history of treated bladder cancer. No urinary bladder mass evident. Stomach/Bowel: Stomach and small bowel are unremarkable. Prior sigmoidectomy with unremarkable anastomosis. Colon is otherwise unremarkable. Lymphatic: No adenopathy in the abdomen or pelvis. Reproductive: Unremarkable Other: No acute findings in the abdomen or pelvis. Musculoskeletal: No significant skeletal lesion. IMPRESSION: VASCULAR 1. The arteries are normal in caliber but heavily calcified throughout. 2. 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 Occluded left profunda origin with reconstitution from collaterals. Left runoff vessels are occluded proximally. Runoff patency in the mid to distal lower leg is indeterminate due to the extensive heavy calcification. There also is occlusion of the left internal iliac artery at its origin. 3. Patency of the right lower extremity arteries, although  evaluation of the mid to distal runoff vasculature is indeterminate due to extensive heavy calcification. NON-VASCULAR 1. Stable low-attenuation liver lesion, benign due to long-term stability. 2. Unremarkable appearances of the left nephrectomy bed. 3. Unremarkable appearances of the colon anastomosis. 4. No acute findings in the abdomen or pelvis. Electronically Signed   By: Andreas Newport M.D.   On: 09/28/2016 04:02      Assessment/Plan 1. Embolization left lower extremity: Patient was initiated on heparin drip. His motor and sensory function is diminished however we are well within the 6 hour window. He will require surgery and this will require general anesthesia. [facility] is not equipped to deal with the profound cardiac difficulties that are highly likely given these constraints have recommended transfer to [facility] where he has received all his  care and where they are prepared to take care of his heart valve as well. At the present time we are waiting to hear back from [facility]. 2. Aortic stenosis: As noted above patient has profound aortic stenosis was deemed a very poor surgical candidate and therefore preparations were made for percutaneous valve replacement. This is certainly a significant complicating factor in his care. 3. Atrial fibrillation associated with ventricular arrhythmia: The present time the patient has been initiated on a heparin drip to prevent further embolization. He has an AICD in place and his recent EKG demonstrates that he is being paced appropriately. 4.  Coronary artery disease: Laboratory data. [facility] [facility] demonstrates history multiple lumens are markedly elevated.  His EKG does demonstrate inferior MI. Again patient is on heparin and we are awaiting word from [facility].   Hortencia Pilar, MD  09/28/2016 4:34 AM

## 2016-10-21 ENCOUNTER — Encounter: Payer: Self-pay | Admitting: Emergency Medicine

## 2016-10-21 ENCOUNTER — Inpatient Hospital Stay
Admission: EM | Admit: 2016-10-21 | Discharge: 2016-10-26 | DRG: 871 | Disposition: A | Discharge status: Skilled Nursing Facility | Payer: Medicare HMO | Attending: Internal Medicine | Admitting: Internal Medicine

## 2016-10-21 ENCOUNTER — Inpatient Hospital Stay: Payer: Medicare HMO

## 2016-10-21 ENCOUNTER — Emergency Department: Payer: Medicare HMO

## 2016-10-21 DIAGNOSIS — Z95810 Presence of automatic (implantable) cardiac defibrillator: Secondary | ICD-10-CM | POA: Diagnosis not present

## 2016-10-21 DIAGNOSIS — I130 Hypertensive heart and chronic kidney disease with heart failure and stage 1 through stage 4 chronic kidney disease, or unspecified chronic kidney disease: Secondary | ICD-10-CM | POA: Diagnosis present

## 2016-10-21 DIAGNOSIS — Z905 Acquired absence of kidney: Secondary | ICD-10-CM

## 2016-10-21 DIAGNOSIS — E1122 Type 2 diabetes mellitus with diabetic chronic kidney disease: Secondary | ICD-10-CM | POA: Diagnosis present

## 2016-10-21 DIAGNOSIS — J09X1 Influenza due to identified novel influenza A virus with pneumonia: Secondary | ICD-10-CM | POA: Diagnosis present

## 2016-10-21 DIAGNOSIS — M6281 Muscle weakness (generalized): Secondary | ICD-10-CM

## 2016-10-21 DIAGNOSIS — M109 Gout, unspecified: Secondary | ICD-10-CM | POA: Diagnosis present

## 2016-10-21 DIAGNOSIS — R000 Tachycardia, unspecified: Secondary | ICD-10-CM | POA: Diagnosis present

## 2016-10-21 DIAGNOSIS — Z955 Presence of coronary angioplasty implant and graft: Secondary | ICD-10-CM

## 2016-10-21 DIAGNOSIS — Z7951 Long term (current) use of inhaled steroids: Secondary | ICD-10-CM

## 2016-10-21 DIAGNOSIS — I350 Nonrheumatic aortic (valve) stenosis: Secondary | ICD-10-CM | POA: Diagnosis present

## 2016-10-21 DIAGNOSIS — A419 Sepsis, unspecified organism: Principal | ICD-10-CM | POA: Diagnosis present

## 2016-10-21 DIAGNOSIS — Z7901 Long term (current) use of anticoagulants: Secondary | ICD-10-CM | POA: Diagnosis not present

## 2016-10-21 DIAGNOSIS — Z87891 Personal history of nicotine dependence: Secondary | ICD-10-CM | POA: Diagnosis not present

## 2016-10-21 DIAGNOSIS — J440 Chronic obstructive pulmonary disease with acute lower respiratory infection: Secondary | ICD-10-CM | POA: Diagnosis present

## 2016-10-21 DIAGNOSIS — I429 Cardiomyopathy, unspecified: Secondary | ICD-10-CM | POA: Diagnosis present

## 2016-10-21 DIAGNOSIS — I2510 Atherosclerotic heart disease of native coronary artery without angina pectoris: Secondary | ICD-10-CM | POA: Diagnosis present

## 2016-10-21 DIAGNOSIS — J189 Pneumonia, unspecified organism: Secondary | ICD-10-CM | POA: Diagnosis present

## 2016-10-21 DIAGNOSIS — Y95 Nosocomial condition: Secondary | ICD-10-CM | POA: Diagnosis present

## 2016-10-21 DIAGNOSIS — I5023 Acute on chronic systolic (congestive) heart failure: Secondary | ICD-10-CM | POA: Diagnosis present

## 2016-10-21 DIAGNOSIS — Z85528 Personal history of other malignant neoplasm of kidney: Secondary | ICD-10-CM

## 2016-10-21 DIAGNOSIS — R609 Edema, unspecified: Secondary | ICD-10-CM

## 2016-10-21 DIAGNOSIS — R52 Pain, unspecified: Secondary | ICD-10-CM

## 2016-10-21 DIAGNOSIS — I4891 Unspecified atrial fibrillation: Secondary | ICD-10-CM | POA: Diagnosis present

## 2016-10-21 DIAGNOSIS — J441 Chronic obstructive pulmonary disease with (acute) exacerbation: Secondary | ICD-10-CM | POA: Diagnosis present

## 2016-10-21 DIAGNOSIS — L7632 Postprocedural hematoma of skin and subcutaneous tissue following other procedure: Secondary | ICD-10-CM | POA: Diagnosis not present

## 2016-10-21 DIAGNOSIS — N182 Chronic kidney disease, stage 2 (mild): Secondary | ICD-10-CM | POA: Diagnosis present

## 2016-10-21 DIAGNOSIS — T148XXA Other injury of unspecified body region, initial encounter: Secondary | ICD-10-CM

## 2016-10-21 DIAGNOSIS — R0902 Hypoxemia: Secondary | ICD-10-CM

## 2016-10-21 DIAGNOSIS — Z79899 Other long term (current) drug therapy: Secondary | ICD-10-CM | POA: Diagnosis not present

## 2016-10-21 DIAGNOSIS — J9601 Acute respiratory failure with hypoxia: Secondary | ICD-10-CM | POA: Diagnosis present

## 2016-10-21 DIAGNOSIS — Z7982 Long term (current) use of aspirin: Secondary | ICD-10-CM

## 2016-10-21 DIAGNOSIS — S8012XA Contusion of left lower leg, initial encounter: Secondary | ICD-10-CM | POA: Diagnosis present

## 2016-10-21 DIAGNOSIS — R262 Difficulty in walking, not elsewhere classified: Secondary | ICD-10-CM

## 2016-10-21 DIAGNOSIS — Z8679 Personal history of other diseases of the circulatory system: Secondary | ICD-10-CM

## 2016-10-21 HISTORY — DX: Nonrheumatic aortic (valve) stenosis: I35.0

## 2016-10-21 HISTORY — DX: Atherosclerotic heart disease of native coronary artery without angina pectoris: I25.10

## 2016-10-21 LAB — CBC WITH DIFFERENTIAL/PLATELET
Basophils Absolute: 0 10*3/uL (ref 0–0.1)
Basophils Relative: 1 %
Eosinophils Absolute: 0 10*3/uL (ref 0–0.7)
Eosinophils Relative: 1 %
HEMATOCRIT: 29.7 % — AB (ref 40.0–52.0)
Hemoglobin: 10 g/dL — ABNORMAL LOW (ref 13.0–18.0)
LYMPHS ABS: 0.3 10*3/uL — AB (ref 1.0–3.6)
LYMPHS PCT: 4 %
MCH: 31.1 pg (ref 26.0–34.0)
MCHC: 33.8 g/dL (ref 32.0–36.0)
MCV: 92 fL (ref 80.0–100.0)
MONOS PCT: 12 %
Monocytes Absolute: 1.1 10*3/uL — ABNORMAL HIGH (ref 0.2–1.0)
NEUTROS ABS: 7.4 10*3/uL — AB (ref 1.4–6.5)
Neutrophils Relative %: 82 %
Platelets: 250 10*3/uL (ref 150–440)
RBC: 3.23 MIL/uL — AB (ref 4.40–5.90)
RDW: 20.4 % — ABNORMAL HIGH (ref 11.5–14.5)
WBC: 8.9 10*3/uL (ref 3.8–10.6)

## 2016-10-21 LAB — URINALYSIS, ROUTINE W REFLEX MICROSCOPIC
BILIRUBIN URINE: NEGATIVE
Glucose, UA: NEGATIVE mg/dL
Hgb urine dipstick: NEGATIVE
Ketones, ur: NEGATIVE mg/dL
Leukocytes, UA: NEGATIVE
Nitrite: NEGATIVE
Protein, ur: 30 mg/dL — AB
SPECIFIC GRAVITY, URINE: 1.021 (ref 1.005–1.030)
pH: 5 (ref 5.0–8.0)

## 2016-10-21 LAB — COMPREHENSIVE METABOLIC PANEL
ALT: 29 U/L (ref 17–63)
AST: 50 U/L — AB (ref 15–41)
Albumin: 3.4 g/dL — ABNORMAL LOW (ref 3.5–5.0)
Alkaline Phosphatase: 91 U/L (ref 38–126)
Anion gap: 9 (ref 5–15)
BUN: 20 mg/dL (ref 6–20)
CHLORIDE: 97 mmol/L — AB (ref 101–111)
CO2: 30 mmol/L (ref 22–32)
CREATININE: 1.25 mg/dL — AB (ref 0.61–1.24)
Calcium: 9.2 mg/dL (ref 8.9–10.3)
GFR calc non Af Amer: 56 mL/min — ABNORMAL LOW (ref >60)
Glucose, Bld: 105 mg/dL — ABNORMAL HIGH (ref 65–99)
POTASSIUM: 4.1 mmol/L (ref 3.5–5.1)
SODIUM: 136 mmol/L (ref 135–145)
Total Bilirubin: 1.2 mg/dL (ref 0.3–1.2)
Total Protein: 7.9 g/dL (ref 6.5–8.1)

## 2016-10-21 LAB — LACTIC ACID, PLASMA
LACTIC ACID, VENOUS: 1.1 mmol/L (ref 0.5–1.9)
LACTIC ACID, VENOUS: 0.9 mmol/L (ref 0.5–1.9)

## 2016-10-21 LAB — BRAIN NATRIURETIC PEPTIDE: B NATRIURETIC PEPTIDE 5: 898 pg/mL — AB (ref 0.0–100.0)

## 2016-10-21 LAB — INFLUENZA PANEL BY PCR (TYPE A & B)
INFLAPCR: POSITIVE — AB
INFLBPCR: NEGATIVE

## 2016-10-21 LAB — MRSA PCR SCREENING: MRSA BY PCR: POSITIVE — AB

## 2016-10-21 MED ORDER — ASPIRIN 81 MG PO CHEW
81.0000 mg | CHEWABLE_TABLET | Freq: Every day | ORAL | Status: DC
  Administered 2016-10-21 – 2016-10-26 (×5): 81 mg via ORAL
  Filled 2016-10-21 (×5): qty 1

## 2016-10-21 MED ORDER — DEXTROSE 5 % IV SOLN
2.0000 g | Freq: Once | INTRAVENOUS | Status: AC
  Administered 2016-10-21: 2 g via INTRAVENOUS
  Filled 2016-10-21: qty 2

## 2016-10-21 MED ORDER — VITAMIN D 1000 UNITS PO TABS
1000.0000 [IU] | ORAL_TABLET | Freq: Every day | ORAL | Status: DC
  Administered 2016-10-21 – 2016-10-26 (×5): 1000 [IU] via ORAL
  Filled 2016-10-21 (×5): qty 1

## 2016-10-21 MED ORDER — MUPIROCIN 2 % EX OINT
1.0000 "application " | TOPICAL_OINTMENT | Freq: Two times a day (BID) | CUTANEOUS | Status: AC
  Administered 2016-10-21 – 2016-10-26 (×10): 1 via NASAL
  Filled 2016-10-21: qty 22

## 2016-10-21 MED ORDER — ONDANSETRON HCL 4 MG/2ML IJ SOLN
4.0000 mg | Freq: Four times a day (QID) | INTRAMUSCULAR | Status: DC | PRN
  Administered 2016-10-26: 4 mg via INTRAVENOUS
  Filled 2016-10-21: qty 2

## 2016-10-21 MED ORDER — METOPROLOL SUCCINATE ER 50 MG PO TB24
200.0000 mg | ORAL_TABLET | Freq: Every day | ORAL | Status: DC
  Administered 2016-10-21: 200 mg via ORAL
  Filled 2016-10-21 (×3): qty 4

## 2016-10-21 MED ORDER — FUROSEMIDE 40 MG PO TABS
40.0000 mg | ORAL_TABLET | Freq: Two times a day (BID) | ORAL | Status: DC
  Administered 2016-10-21: 40 mg via ORAL
  Filled 2016-10-21: qty 1

## 2016-10-21 MED ORDER — ACETAMINOPHEN 325 MG PO TABS
650.0000 mg | ORAL_TABLET | ORAL | Status: DC | PRN
  Administered 2016-10-21: 650 mg via ORAL
  Filled 2016-10-21: qty 2

## 2016-10-21 MED ORDER — ALBUTEROL SULFATE (2.5 MG/3ML) 0.083% IN NEBU
2.5000 mg | INHALATION_SOLUTION | Freq: Four times a day (QID) | RESPIRATORY_TRACT | Status: DC | PRN
  Administered 2016-10-21 – 2016-10-23 (×4): 2.5 mg via RESPIRATORY_TRACT
  Filled 2016-10-21 (×4): qty 3

## 2016-10-21 MED ORDER — MOMETASONE FURO-FORMOTEROL FUM 200-5 MCG/ACT IN AERO
2.0000 | INHALATION_SPRAY | Freq: Two times a day (BID) | RESPIRATORY_TRACT | Status: DC
  Administered 2016-10-21 – 2016-10-25 (×10): 2 via RESPIRATORY_TRACT
  Filled 2016-10-21: qty 8.8

## 2016-10-21 MED ORDER — APIXABAN 5 MG PO TABS
5.0000 mg | ORAL_TABLET | Freq: Two times a day (BID) | ORAL | Status: DC
  Administered 2016-10-21 – 2016-10-23 (×6): 5 mg via ORAL
  Filled 2016-10-21 (×6): qty 1

## 2016-10-21 MED ORDER — DEXTROSE 5 % IV SOLN
2.0000 g | Freq: Two times a day (BID) | INTRAVENOUS | Status: DC
  Administered 2016-10-21 – 2016-10-25 (×8): 2 g via INTRAVENOUS
  Filled 2016-10-21 (×10): qty 2

## 2016-10-21 MED ORDER — FUROSEMIDE 10 MG/ML IJ SOLN
20.0000 mg | Freq: Every day | INTRAMUSCULAR | Status: DC
  Administered 2016-10-21: 20 mg via INTRAVENOUS
  Filled 2016-10-21: qty 2

## 2016-10-21 MED ORDER — VANCOMYCIN HCL IN DEXTROSE 1-5 GM/200ML-% IV SOLN
1000.0000 mg | Freq: Once | INTRAVENOUS | Status: AC
  Administered 2016-10-21: 1000 mg via INTRAVENOUS
  Filled 2016-10-21: qty 200

## 2016-10-21 MED ORDER — NITROGLYCERIN 0.4 MG SL SUBL
0.4000 mg | SUBLINGUAL_TABLET | SUBLINGUAL | Status: DC | PRN
Start: 2016-10-21 — End: 2016-10-26

## 2016-10-21 MED ORDER — ATORVASTATIN CALCIUM 20 MG PO TABS
80.0000 mg | ORAL_TABLET | Freq: Every day | ORAL | Status: DC
  Administered 2016-10-21 – 2016-10-25 (×3): 80 mg via ORAL
  Filled 2016-10-21 (×3): qty 4

## 2016-10-21 MED ORDER — MAGNESIUM OXIDE 400 (241.3 MG) MG PO TABS
400.0000 mg | ORAL_TABLET | Freq: Two times a day (BID) | ORAL | Status: DC
Start: 2016-10-21 — End: 2016-10-26
  Administered 2016-10-21 – 2016-10-26 (×10): 400 mg via ORAL
  Filled 2016-10-21 (×10): qty 1

## 2016-10-21 MED ORDER — CHLORHEXIDINE GLUCONATE CLOTH 2 % EX PADS
6.0000 | MEDICATED_PAD | Freq: Every day | CUTANEOUS | Status: DC
  Administered 2016-10-22 – 2016-10-26 (×2): 6 via TOPICAL

## 2016-10-21 MED ORDER — BISACODYL 5 MG PO TBEC: 5.0000 mg | DELAYED_RELEASE_TABLET | Freq: Every day | ORAL | Status: DC | PRN

## 2016-10-21 MED ORDER — ONDANSETRON HCL 4 MG PO TABS
4.0000 mg | ORAL_TABLET | Freq: Four times a day (QID) | ORAL | Status: DC | PRN
  Administered 2016-10-22: 4 mg via ORAL
  Filled 2016-10-21: qty 1

## 2016-10-21 MED ORDER — VANCOMYCIN HCL IN DEXTROSE 1-5 GM/200ML-% IV SOLN
1000.0000 mg | Freq: Two times a day (BID) | INTRAVENOUS | Status: DC
  Administered 2016-10-21 – 2016-10-25 (×6): 1000 mg via INTRAVENOUS
  Filled 2016-10-21 (×10): qty 200

## 2016-10-21 MED ORDER — FLEET ENEMA 7-19 GM/118ML RE ENEM: 1.0000 | ENEMA | Freq: Once | RECTAL | Status: DC | PRN

## 2016-10-21 MED ORDER — CEFEPIME-DEXTROSE 2 GM/50ML IV SOLR
2.0000 g | Freq: Two times a day (BID) | INTRAVENOUS | Status: DC
  Filled 2016-10-21: qty 50

## 2016-10-21 MED ORDER — HYDROCODONE-ACETAMINOPHEN 5-325 MG PO TABS: 1.0000 | ORAL_TABLET | ORAL | Status: DC | PRN

## 2016-10-21 MED ORDER — DOCUSATE SODIUM 100 MG PO CAPS: 200.0000 mg | ORAL_CAPSULE | Freq: Two times a day (BID) | ORAL | Status: DC | PRN

## 2016-10-21 MED ORDER — RISAQUAD PO CAPS
1.0000 | ORAL_CAPSULE | Freq: Every day | ORAL | Status: DC
Start: 2016-10-21 — End: 2016-10-26
  Administered 2016-10-21 – 2016-10-26 (×5): 1 via ORAL
  Filled 2016-10-21 (×5): qty 1

## 2016-10-21 MED ORDER — AMIODARONE HCL 200 MG PO TABS
200.0000 mg | ORAL_TABLET | Freq: Every day | ORAL | Status: DC
  Administered 2016-10-21 – 2016-10-26 (×5): 200 mg via ORAL
  Filled 2016-10-21 (×5): qty 1

## 2016-10-21 MED ORDER — CEFEPIME-DEXTROSE 2 GM/50ML IV SOLR
2.0000 g | Freq: Once | INTRAVENOUS | Status: DC
  Filled 2016-10-21: qty 50

## 2016-10-21 MED ORDER — SENNOSIDES-DOCUSATE SODIUM 8.6-50 MG PO TABS: 1.0000 | ORAL_TABLET | Freq: Every evening | ORAL | Status: DC | PRN

## 2016-10-21 MED ORDER — GUAIFENESIN 100 MG/5ML PO SOLN
5.0000 mL | ORAL | Status: DC | PRN
  Administered 2016-10-21 – 2016-10-25 (×5): 100 mg via ORAL
  Filled 2016-10-21 (×7): qty 5

## 2016-10-21 MED ORDER — ORAL CARE MOUTH RINSE
15.0000 mL | Freq: Two times a day (BID) | OROMUCOSAL | Status: DC
  Administered 2016-10-21 – 2016-10-25 (×7): 15 mL via OROMUCOSAL

## 2016-10-21 NOTE — Progress Notes (Signed)
Family Meeting Note  Advance Directive:no  Today a meeting took place with the Patient. 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

## 2016-10-21 NOTE — Progress Notes (Signed)
LCSW went to meet and greet with patient. He reports he is not feeling well and had shortness of breath. He is currently at [facility] for STR from blood clot in left leg. He reports he can return once he is discharged from medical floor. Assessment completed/ Fl2 started.  LCSW looked up Passr number and   A number has not been assigned will consult with medical [place] LCSW [phone]

## 2016-10-21 NOTE — NC FL2 (Signed)
[place] LEVEL OF CARE SCREENING TOOL     IDENTIFICATION  Patient Name: Darius Taylor Birthdate: [birth year] Sex: male Admission Date (Current Location): 10/21/2016  [place] and [place] Number:  Engineering geologist and Address:  [facility], [address] [place], [place] [address]      Provider Number: [id number]  Attending Physician Name and Address:  Bettey Costa, MD  Relative Name and Phone Number:       Current Level of Care: Hospital Recommended Level of Care: [place] Prior Approval Number:    Date Approved/Denied:   PASRR Number:    Discharge Plan: SNF    Current Diagnoses: Patient Active Problem List   Diagnosis Date Noted  . HCAP (healthcare-associated pneumonia) 10/21/2016    Orientation RESPIRATION BLADDER Height & Weight     Self, Time, Situation, Place  O2 (4 litres) Continent Weight: 215 lb (97.5 kg) Height:  5\' 10"  (177.8 cm)  BEHAVIORAL SYMPTOMS/MOOD NEUROLOGICAL BOWEL NUTRITION STATUS      Continent Diet (Normal)  AMBULATORY STATUS COMMUNICATION OF NEEDS Skin   Supervision Verbally Other (Comment) (Blood clot left lower leg)                       Personal Care Assistance Level of Assistance  Bathing, Feeding, Dressing, Total care Bathing Assistance: Limited assistance Feeding assistance: Independent Dressing Assistance: Limited assistance Total Care Assistance: Limited assistance   Functional Limitations Info  Sight, Hearing, Speech Sight Info: Adequate Hearing Info: Adequate Speech Info: Adequate    SPECIAL CARE FACTORS FREQUENCY   Blot clot left lower leg Uses walker                    Contractures Contractures Info: Not present    Additional Factors Info  Isolation Precautions         Isolation Precautions Info: Droplet precaution     Current Medications (10/21/2016):  This is the current hospital active medication list Current Facility-Administered  Medications  Medication Dose Route Frequency Provider Last Rate Last Dose  . ceFEPIme (MAXIPIME) 2 GM / 25mL IVPB premix  2 g Intravenous Q12H Sheema M Hallaji, RPH      . furosemide (LASIX) injection 20 mg  20 mg Intravenous Daily Sital Mody, MD      . vancomycin (VANCOCIN) IVPB 1000 mg/200 mL premix  1,000 mg Intravenous Q12H Sheema M Hallaji, RPH       Current Outpatient Prescriptions  Medication Sig Dispense Refill  . acetaminophen (TYLENOL) 325 MG tablet Take 325 mg by mouth every 6 (six) hours as needed.    Marland Kitchen albuterol (PROVENTIL) (2.5 MG/3ML) 0.083% nebulizer solution Take 2.5 mg by nebulization every 6 (six) hours as needed for wheezing or shortness of breath.    Marland Kitchen amiodarone (PACERONE) 200 MG tablet Take 200 mg by mouth daily.    Marland Kitchen apixaban (ELIQUIS) 5 MG TABS tablet Take 5 mg by mouth 2 (two) times daily.    Marland Kitchen aspirin 81 MG chewable tablet Chew 81 mg by mouth daily.    Marland Kitchen atorvastatin (LIPITOR) 80 MG tablet Take 80 mg by mouth daily.    . budesonide-formoterol (SYMBICORT) 160-4.5 MCG/ACT inhaler Inhale 2 puffs into the lungs 2 (two) times daily.    . cholecalciferol (VITAMIN D) 1000 units tablet Take 1,000 Units by mouth daily.    Marland Kitchen docusate sodium (COLACE) 100 MG capsule Take 200 mg by mouth 2 (two) times daily as needed for  mild constipation.    Marland Kitchen doxycycline (VIBRA-TABS) 100 MG tablet Take 100 mg by mouth 2 (two) times daily.    . furosemide (LASIX) 40 MG tablet Take 40 mg by mouth 2 (two) times daily.    . magnesium oxide (MAG-OX) 400 MG tablet Take 400 mg by mouth 2 (two) times daily.    . metoprolol (TOPROL-XL) 200 MG 24 hr tablet Take 200 mg by mouth daily.    . nitroGLYCERIN (NITROSTAT) 0.4 MG SL tablet Place 0.4 mg under the tongue every 5 (five) minutes as needed for chest pain.    . Probiotic Product (ALIGN) 4 MG CAPS Take 1 capsule by mouth daily.       Discharge Medications: Please see discharge summary for a list of discharge medications.  Relevant Imaging  Results:  Relevant Lab Results:   Additional Information SSN [id number]  Joana Reamer, [place]

## 2016-10-21 NOTE — Consult Note (Signed)
[place]  CARDIOLOGY CONSULT NOTE  Patient ID: Darius Taylor MRN: [id number] DOB/AGE: 1946/09/07 71 y.o.  Admit date: 10/21/2016 Referring Physician Dr. Benjie Karvonen Primary Physician   Primary Cardiologist Dr. Marisa Severin, [facility] Reason for Consultation sob/as  HPI: 71 y.o. male with a history of ventricular tachycardia with an ICD, prior complex atrial flutter ablation currently anticoagulated with apixiban, coronary artery disease with prior bare metal stenting to the proximal LAD, and now a new diagnosis of severe symptomatic aortic valve stenosis.  His ejection fraction is approximately 45%. He has an aicd in place. He had recent cardiac catheterization in July 2017 which showed patent stents. He is s/p left CFA, SFA and profunda femoris embolectomy and edarterectomy of the left common femoral artery recently complicated by wound dehiscence.  He is being considered for TAVR at [facility] . He now presents with multiple problems including sob with fever and evidence of pna vs pulmonary edema on cxr. Moderately anemic with hgb of 10.0, BNP 898, positive MRSA PCR, and chest pain  With pleuritic symptoms. He states he started feeling sob and with fever 1-2 days ago. He has a fasciotomy incision in his left leg. He also complans of left wrist pain with swelling. Pt has an ef of 45% with recent rhc revearling PA pressure of 65/32 with mean of 40. pcwp was 25.     Review of Systems  Constitutional: Positive for fever.  HENT: Negative.   Eyes: Negative.   Respiratory: Positive for cough, hemoptysis, sputum production and shortness of breath.   Cardiovascular: Positive for chest pain.  Gastrointestinal: Negative.   Genitourinary: Negative.   Musculoskeletal: Positive for myalgias.  Skin: Negative.   Neurological: Negative.   Endo/Heme/Allergies: Negative.   Psychiatric/Behavioral: Negative.     Past Medical History:  Diagnosis Date  . Aortic stenosis   .  Asthma   . CAD (coronary artery disease)   . Cancer [facility])    colon, bladder and kidney  . COPD (chronic obstructive pulmonary disease) ([place])   . Hypertension     No family history on file.  Social History   Social History  . Marital status: Married    Spouse name: N/A  . Number of children: N/A  . Years of education: N/A   Occupational History  . Not on file.   Social History Main Topics  . Smoking status: Former Research scientist (life sciences)  . Smokeless tobacco: Never Used  . Alcohol use No  . Drug use: Unknown  . Sexual activity: Not on file   Other Topics Concern  . Not on file   Social History Narrative  . No narrative on file    Past Surgical History:  Procedure Laterality Date  . CARDIAC DEFIBRILLATOR PLACEMENT    . CORONARY ANGIOPLASTY  09/25/2016  . KIDNEY SURGERY     left removed      (Not in a hospital admission)  Physical Exam: Blood pressure (!) 111/99, pulse 65, temperature (!) 101.4 F (38.6 C), temperature source Oral, resp. rate (!) 22, height 5\' 10"  (1.778 m), weight 215 lb (97.5 kg), SpO2 96 %.   Wt Readings from Last 1 Encounters:  10/21/16 215 lb (97.5 kg)     General appearance: alert and cooperative Resp: rales bilaterally, rhonchi bilaterally and wheezes bilaterally Cardio: regular rate and rhythm and systolic murmur: late systolic 3/6, crescendo and decrescendo at 2nd left intercostal space, at 2nd right intercostal space, at lower left sternal border GI: soft, non-tender;  bowel sounds normal; no masses,  no organomegaly Extremities: fasciotomy incision on left leg. Swollen left wrist Neurologic: Grossly normal  Labs:   Lab Results  Component Value Date   WBC 8.9 10/21/2016   HGB 10.0 (L) 10/21/2016   HCT 29.7 (L) 10/21/2016   MCV 92.0 10/21/2016   PLT 250 10/21/2016    Recent Labs Lab 10/21/16 0645  NA 136  K 4.1  CL 97*  CO2 30  BUN 20  CREATININE 1.25*  CALCIUM 9.2  PROT 7.9  BILITOT 1.2  ALKPHOS 91  ALT 29  AST 50*  GLUCOSE 105*    Lab Results  Component Value Date   CKTOTAL 105 04/15/2013   CKMB 1.5 10/19/2014   TROPONINI 0.22 ([place]) 09/28/2016      Radiology: opacities bilaterally left greater than right s/p pulmonary edema vs atypical bronchopneumonia EKG: atrial paced rhythm.   ASSESSMENT AND PLAN:    71 y.o. male with a history of ventricular tachycardia with an ICD, prior complex atrial flutter ablation currently anticoagulated with apixiban, coronary artery disease with prior bare metal stenting to the proximal LAD, and now a new diagnosis of severe symptomatic aortic valve stenosis. He is being evaluated at [facility] for considertion for TAVR. He is now admitted with fever, sob and possible pneumonia vs mild volume overload. He is hemodynamically stable. Cultures pending. WIll gently diurese and treat with emperic abx. Will need treatment for his infectious process and further healing of his fasciotomy piror to consideration for tavr. Will discuss with Dr. Michiel Cowboy next week.  Signed: Teodoro Spray MD, [facility] 10/21/2016, 9:44 AM

## 2016-10-21 NOTE — Progress Notes (Signed)
Splint applied to left wrist.

## 2016-10-21 NOTE — Progress Notes (Signed)
Temp 100. MD paged. Orders to be placed for tylenol.

## 2016-10-21 NOTE — Clinical Social Work Note (Signed)
Clinical Social Work Assessment  Patient Details  Name: Darius Taylor MRN: [id number] Date of Birth: 08-16-1946  Date of referral:  10/21/16               Reason for consult:  Facility Placement                Permission sought to share information with:  Family Supports, Customer service manager Permission granted to share information::  Yes, Verbal Permission Granted  Name::     Darius Taylor- spouse [phone]  Agency::  [facility]  Relationship::     Contact Information:     Housing/Transportation Living arrangements for the past 2 months:  Single Family Home Source of Information:  Patient Patient Interpreter Needed:  None Criminal Activity/Legal Involvement Pertinent to Current Situation/Hospitalization:  No - Comment as needed Significant Relationships:  Adult Children, Spouse Lives with:  Spouse (At [facility] for STR) Do you feel safe going back to the place where you live?  Yes Need for family participation in patient care:  No (Coment)  Care giving concerns: No family member present in ED  Social Worker assessment / plan: LCSW introduced myself to patient. He is oriented x4 and reports he is at [facility] for STR but was having breathing issues and numbness and tingling down his right arm and hand. ( as per EDP note) Patient is a 72 y.o. male with a history of ventricular tachycardia with an ICD, prior complex atrial flutter ablation, coronary artery disease with prior bare metal stenting to the proximal LAD, and now a new diagnosis of severe symptomatic aortic valve stenosis.  His ejection fraction is approximately 45%. He had recent cardiac catheterization in July 2017 which showed patent stents. He is s/p left CFA, SFA and profunda femoris embolectomy and edarterectomy of the left common femoral artery recently complicated by wound dehiscence.  He is being considered for TAVR at [facility] . He now presents with multiple problems including sob with fever and evidence of pna vs  pulmonary edema on cxr. He is married and gave verbal consent to speak to his wife, daughter and Darius Taylor. He has had some medical issues and is scheduled for heart surgery in Feb but developed a clot in his left leg so he then went to [facility] as his wife couldn't care for him. Patient has [facility]. He is required to use a walker at this time and is independent with most ADL's. He understands that he will be admitted to hospital. No further needs from SW.  Employment status:  Retired Insurance underwriter information:  Engineer, materials) PT Recommendations:  TBD if required Information / Referral to community resources:   (None requested)  Patient/Family's Response to care: TBD  Patient/Family's Understanding of and Emotional Response to Diagnosis, Current Treatment, and Prognosis: TBD  Emotional Assessment Appearance:  Appears stated age Attitude/Demeanor/Rapport:   (Polite, Calm) Affect (typically observed):  Accepting, Adaptable Orientation:   x4 Alcohol / Substance use:  Not Applicable Psych involvement (Current and /or in the community):  No (Comment)  Discharge Needs  Concerns to be addressed:  No discharge needs identified Readmission within the last 30 days:    Current discharge risk:  None Barriers to Discharge:  No Barriers Identified   Joana Reamer, LCSW 10/21/2016, 10:19 AM

## 2016-10-21 NOTE — Progress Notes (Signed)
Pharmacy Antibiotic Note  Darius Taylor is a 71 y.o. male admitted on 10/21/2016 with  pneumonia/HCAP .  Pharmacy has been consulted for cefepime and vancomycin  Dosing. Patient received cafepime 2g and vancomycin 1gm IV x 1 dose in ED.  Plan: Ke: 0.058   T1/2: 12   VD: 67.9  Will start patient on vancomycin 1gm IV every 12 hours with 6 hours stack dosing. Calculated trough at Css is 15. Trough ordered prior to 5th dose. MRSA PCR ordered- if negative, recommend discontinuing vancomycin.   Will start patient on Cefepime 2gm IV every 12 hours.   Height: 5\' 10"  (177.8 cm) Weight: 215 lb (97.5 kg) IBW/kg (Calculated) : 73  Temp (24hrs), Avg:100.6 F (38.1 C), Min:99.7 F (37.6 C), Max:101.4 F (38.6 C)   Recent Labs Lab 10/21/16 0645 10/21/16 0709  WBC 8.9  --   CREATININE 1.25*  --   LATICACIDVEN  --  1.1    Estimated Creatinine Clearance: 62.6 mL/min (by C-G formula based on SCr of 1.25 mg/dL (H)).    No Known Allergies  Antimicrobials this admission: 1/28 vancomycin>>  1/28 Cefepime >>   Dose adjustments this admission:  Microbiology results: 1/28  BCx: sent 1/28 UCx: sent 1/28  MRSA PCR: pending  Thank you for allowing pharmacy to be a part of this patient's care.  Pernell Dupre, PharmD, BCPS Clinical Pharmacist 10/21/2016 10:38 AM

## 2016-10-21 NOTE — ED Triage Notes (Addendum)
Patient brought in by ems from [facility]. Patient with complaint with complaint of shortness of breath that started last night. Patient used albuterol this morning with no improvement. Patient oxygen saturation 91% per ems and started on 4l oxygen. Patient with complaint of pain to left hand that started last night. Patient had surgery 2 weeks ago for a DVT in left leg.

## 2016-10-21 NOTE — H&P (Addendum)
[place] [facility] [place] NAME: Darius Taylor    MR#:  [id number]  DATE OF BIRTH:  [birth year]  DATE OF ADMISSION:  10/21/2016  PRIMARY CARE PHYSICIAN: [place]   REQUESTING/REFERRING PHYSICIAN: dr Corky Downs  CHIEF COMPLAINT:   Shortness of breath and left hand pain HISTORY OF PRESENT ILLNESS:  Darius Taylor  is a 71 y.o. male with a known history of Diastolic dysfunction grade 2 with preserved ejection fraction by echocardiogram January 2018, V. tach with ICD, CAD with prior bare metal stent, severe aortic valve stenosis, recent admission to [facility] for left leg acute limb ischemia status post embolectomy of left CFa, SFA, profonda femoris, left common femoral artery endarterectomy and left lower leg 4 compartment fasciotomies who presents with shortness of breath and left hand pain. Chest x-ray in emergency department shows pneumonia versus pulmonary edema Patient reports that he did not fall in his left hand but started hurting yesterday. Patient did have a fever in the emergency room. Patient has received broad-spectrum antibiotics.  PAST MEDICAL HISTORY:   Past Medical History:  Diagnosis Date  . Asthma   . Cancer [facility])    colon, bladder and kidney  . COPD (chronic obstructive pulmonary disease) ([place])   . Hypertension    Severe symptomatic aortic valve stenosis Atrial fibrillation status post ablation Chronic kidney disease stage II History of renal cell carcinoma Nonischemic cardiomyopathy CAD with cardiac catheterization in 2017, July showing nonflow limiting coronary artery disease PAST SURGICAL HISTORY:   Past Surgical History:  Procedure Laterality Date  . CARDIAC DEFIBRILLATOR PLACEMENT    . CORONARY ANGIOPLASTY  09/25/2016  . KIDNEY SURGERY     left removed    SOCIAL HISTORY:   Social History  Substance Use Topics  . Smoking status: Former Research scientist (life sciences)  . Smokeless tobacco: Never Used  . Alcohol use No     FAMILY HISTORY:  Cancer Aneurysm   DRUG ALLERGIES:  No Known Allergies  REVIEW OF SYSTEMS:   Review of Systems  Constitutional: Positive for fever. Negative for chills and malaise/fatigue.  HENT: Negative.  Negative for ear discharge, ear pain, hearing loss, nosebleeds and sore throat.   Eyes: Negative.  Negative for blurred vision and pain.  Respiratory: Positive for cough. Negative for hemoptysis, shortness of breath and wheezing.   Cardiovascular: Positive for leg swelling and PND. Negative for chest pain and palpitations.  Gastrointestinal: Negative.  Negative for abdominal pain, blood in stool, diarrhea, nausea and vomiting.  Genitourinary: Negative.  Negative for dysuria.  Musculoskeletal: Positive for joint pain. Negative for back pain.  Skin: Negative.   Neurological: Negative for dizziness, tremors, speech change, focal weakness, seizures and headaches.  Endo/Heme/Allergies: Negative.  Does not bruise/bleed easily.  Psychiatric/Behavioral: Negative.  Negative for depression, hallucinations and suicidal ideas.    MEDICATIONS AT HOME:   Prior to Admission medications   Medication Sig Start Date End Date Taking? Authorizing Provider  acetaminophen (TYLENOL) 325 MG tablet Take 325 mg by mouth every 6 (six) hours as needed.   Yes Historical Provider, MD  albuterol (PROVENTIL) (2.5 MG/3ML) 0.083% nebulizer solution Take 2.5 mg by nebulization every 6 (six) hours as needed for wheezing or shortness of breath.   Yes Historical Provider, MD  amiodarone (PACERONE) 200 MG tablet Take 200 mg by mouth daily.   Yes Historical Provider, MD  apixaban (ELIQUIS) 5 MG TABS tablet Take 5 mg by mouth 2 (two) times daily.   Yes Historical Provider,  MD  aspirin 81 MG chewable tablet Chew 81 mg by mouth daily.   Yes Historical Provider, MD  atorvastatin (LIPITOR) 80 MG tablet Take 80 mg by mouth daily.   Yes Historical Provider, MD  budesonide-formoterol (SYMBICORT) 160-4.5 MCG/ACT inhaler  Inhale 2 puffs into the lungs 2 (two) times daily.   Yes Historical Provider, MD  cholecalciferol (VITAMIN D) 1000 units tablet Take 1,000 Units by mouth daily.   Yes Historical Provider, MD  docusate sodium (COLACE) 100 MG capsule Take 200 mg by mouth 2 (two) times daily as needed for mild constipation.   Yes Historical Provider, MD  doxycycline (VIBRA-TABS) 100 MG tablet Take 100 mg by mouth 2 (two) times daily. 10/11/16 10/25/16 Yes Historical Provider, MD  furosemide (LASIX) 40 MG tablet Take 40 mg by mouth 2 (two) times daily.   Yes Historical Provider, MD  magnesium oxide (MAG-OX) 400 MG tablet Take 400 mg by mouth 2 (two) times daily.   Yes Historical Provider, MD  metoprolol (TOPROL-XL) 200 MG 24 hr tablet Take 200 mg by mouth daily.   Yes Historical Provider, MD  nitroGLYCERIN (NITROSTAT) 0.4 MG SL tablet Place 0.4 mg under the tongue every 5 (five) minutes as needed for chest pain.   Yes Historical Provider, MD  Probiotic Product (ALIGN) 4 MG CAPS Take 1 capsule by mouth daily.   Yes Historical Provider, MD      VITAL SIGNS:  Blood pressure (!) 111/99, pulse 65, temperature (!) 101.4 F (38.6 C), temperature source Oral, resp. rate (!) 22, height 5\' 10"  (1.778 m), weight 97.5 kg (215 lb), SpO2 96 %.  PHYSICAL EXAMINATION:   Physical Exam  Constitutional: He is oriented to person, place, and time and well-developed, well-nourished, and in no distress. No distress.  HENT:  Head: Normocephalic.  Eyes: No scleral icterus.  Neck: Normal range of motion. Neck supple. No JVD present. No tracheal deviation present.  Cardiovascular: Normal rate and regular rhythm.  Exam reveals no gallop and no friction rub.   Murmur heard. aicd placed  Pulmonary/Chest: Effort normal. No respiratory distress. He has wheezes. He has rales. He exhibits no tenderness.  Abdominal: Soft. Bowel sounds are normal. He exhibits no distension and no mass. There is no tenderness. There is no rebound and no guarding.   Musculoskeletal: Normal range of motion. He exhibits edema.  Left wrist painful to touch  Neurological: He is alert and oriented to person, place, and time.  Skin: Skin is warm. No rash noted. No erythema.  Left leg wrapped small amount of erythema around surgical site  Psychiatric: Affect and judgment normal.      LABORATORY PANEL:   CBC  Recent Labs Lab 10/21/16 0645  WBC 8.9  HGB 10.0*  HCT 29.7*  PLT 250   ------------------------------------------------------------------------------------------------------------------  Chemistries   Recent Labs Lab 10/21/16 0645  NA 136  K 4.1  CL 97*  CO2 30  GLUCOSE 105*  BUN 20  CREATININE 1.25*  CALCIUM 9.2  AST 50*  ALT 29  ALKPHOS 91  BILITOT 1.2   ------------------------------------------------------------------------------------------------------------------  Cardiac Enzymes No results for input(s): TROPONINI in the last 168 hours. ------------------------------------------------------------------------------------------------------------------  RADIOLOGY:  Dg Chest Portable 1 View  Result Date: 10/21/2016 CLINICAL DATA:  Shortness of breath. EXAM: PORTABLE CHEST 1 VIEW COMPARISON:  10/19/2014 FINDINGS: Dual lead cardiac pacemaker is in stable position. The cardiac silhouette is enlarged. Mediastinal contours appear intact. Heavy calcific atherosclerotic disease of the aorta noted. There is no evidence of pleural effusion or  pneumothorax. Wispy interstitial opacities with lower lobe predominance and mild bronchiectasis. Osseous structures are without acute abnormality. Soft tissues are grossly normal. IMPRESSION: Interstitial opacities with lower lobe predominance, left worse than right may represent pulmonary edema or atypical bronchopneumonia. Electronically Signed   By: Fidela Salisbury M.D.   On: 10/21/2016 07:42    EKG:  NSR no ST elevation  IMPRESSION AND PLAN:   71 year old male with a complicated  cardiac history with most recent echocardiogram showing severe aortic stenosis Who presents with shortness of breath and left wrist pain.  1. Sepsis with tachycardia and fever Patient will be treated for HCAP and I will discuss with ortho about left hand aspiration r/o gout/septic joint Continue broad spectrum antibiotics and follow up on final cultures Check influeniza  2. Acute hypoxic respiratory failure in the setting of HCAP vs acute on chronic combined systolic and diastolic heart failure  Due to severe AS no ACEI or NTG but will start low dose IV LAsix Wean O2 as tolerated. Continue cefepime and vancomycin Monitor intake and daily weight  3. Severe AS by ECHO 09/2016 at [facility]: [place] cardiology consultation It appears from patient's outpatient cardiologist npte they are evaluating him for transcatheter aortic valve replacement.  4. COPD without exacerbation Continue inhalers 5. Left wrist pain: Obtain x-ray of left wrist Discussed case with orthopedics  6. History of atrial fibrillation: Continue amiodarone and Eliquis  7. CAD: Continue aspirin, atorvastatin, metoprolol    Case discussed with ORTHO nurse and Dr Ubaldo Glassing with plan as stated above  All the records are reviewed and case discussed with ED provider. Management plans discussed with the patient and he is in agreement  CODE STATUS: FULL  TOTAL TIME TAKING CARE OF THIS PATIENT: 45 minutes.    Jakari Jacot M.D on 10/21/2016 at 9:01 AM  Between 7am to 6pm - Pager - [phone]  After 6pm go to [email or web address] - password [email or web address] [place] [facility]  Office  [phone]  CC: Primary care physician; [place]

## 2016-10-21 NOTE — Clinical Social Work Note (Signed)
CSW contacted [facility] to confirm patient can return when stable. Patient is a LTC patient and can return. CSW following.  Santiago Bumpers, MSW, Latanya Presser [phone]

## 2016-10-21 NOTE — ED Provider Notes (Signed)
[facility] Emergency Department Provider Note   ____________________________________________    I have reviewed the triage vital signs and the nursing notes.   HISTORY  Chief Complaint Shortness of Breath     HPI Darius Taylor is a 71 y.o. male who presents with complaints of cough and shortness of breath. Patient reports this started last night he feels worse today. He reports he is in a nursing home after having a procedure to [facility]. Review of medical records and x-rays patient was seen here on January 5 with limb ischemia, he was [facility] where he received endarterectomy, tonsillectomy, four compartment fasciotomies. He has been nursing home rehabilitating. Also complains of cramping pain in his left wrist/hand. Also apparently is pending T aVR   Past Medical History:  Diagnosis Date  . Asthma   . Cancer [facility])    colon, bladder and kidney  . COPD (chronic obstructive pulmonary disease) ([place])   . Hypertension     There are no active problems to display for this patient.   Past Surgical History:  Procedure Laterality Date  . CARDIAC DEFIBRILLATOR PLACEMENT    . CORONARY ANGIOPLASTY  09/25/2016  . KIDNEY SURGERY     left removed    Prior to Admission medications   Medication Sig Start Date End Date Taking? Authorizing Provider  acetaminophen (TYLENOL) 325 MG tablet Take 325 mg by mouth every 6 (six) hours as needed.   Yes Historical Provider, MD  albuterol (PROVENTIL) (2.5 MG/3ML) 0.083% nebulizer solution Take 2.5 mg by nebulization every 6 (six) hours as needed for wheezing or shortness of breath.   Yes Historical Provider, MD  amiodarone (PACERONE) 200 MG tablet Take 200 mg by mouth daily.   Yes Historical Provider, MD  apixaban (ELIQUIS) 5 MG TABS tablet Take 5 mg by mouth 2 (two) times daily.   Yes Historical Provider, MD  aspirin 81 MG chewable tablet Chew 81 mg by mouth daily.   Yes Historical Provider, MD  atorvastatin (LIPITOR)  80 MG tablet Take 80 mg by mouth daily.   Yes Historical Provider, MD  budesonide-formoterol (SYMBICORT) 160-4.5 MCG/ACT inhaler Inhale 2 puffs into the lungs 2 (two) times daily.   Yes Historical Provider, MD  cholecalciferol (VITAMIN D) 1000 units tablet Take 1,000 Units by mouth daily.   Yes Historical Provider, MD  docusate sodium (COLACE) 100 MG capsule Take 200 mg by mouth 2 (two) times daily as needed for mild constipation.   Yes Historical Provider, MD  doxycycline (VIBRA-TABS) 100 MG tablet Take 100 mg by mouth 2 (two) times daily. 10/11/16 10/25/16 Yes Historical Provider, MD  furosemide (LASIX) 40 MG tablet Take 40 mg by mouth 2 (two) times daily.   Yes Historical Provider, MD  magnesium oxide (MAG-OX) 400 MG tablet Take 400 mg by mouth 2 (two) times daily.   Yes Historical Provider, MD  metoprolol (TOPROL-XL) 200 MG 24 hr tablet Take 200 mg by mouth daily.   Yes Historical Provider, MD  nitroGLYCERIN (NITROSTAT) 0.4 MG SL tablet Place 0.4 mg under the tongue every 5 (five) minutes as needed for chest pain.   Yes Historical Provider, MD  Probiotic Product (ALIGN) 4 MG CAPS Take 1 capsule by mouth daily.   Yes Historical Provider, MD     Allergies Patient has no known allergies.  No family history on file.  Social History Social History  Substance Use Topics  . Smoking status: Former Research scientist (life sciences)  . Smokeless tobacco: Never Used  .  Alcohol use No    Review of Systems  Constitutional: Positive chills Eyes: No visual changes.   Cardiovascular: Denies chest pain. Respiratory: As above, productive cough Gastrointestinal: No abdominal pain.    Genitourinary: Negative for dysuria. Musculoskeletal: Negative for back pain. Myalgias, left wrist/hand pain as above  Neurological: Negative for headaches   10-point ROS otherwise negative.  ____________________________________________   PHYSICAL EXAM:  VITAL SIGNS: ED Triage Vitals  Enc Vitals Group     BP 10/21/16 0643 (!) 144/81       Pulse Rate 10/21/16 0637 64     Resp 10/21/16 0637 (!) 26     Temp 10/21/16 0637 (!) 101.4 F (38.6 C)     Temp Source 10/21/16 0637 Oral     SpO2 10/21/16 0637 (!) 89 %     Weight 10/21/16 0638 215 lb (97.5 kg)     Height 10/21/16 0638 5\' 10"  (1.778 m)     Head Circumference --      Peak Flow --      Pain Score 10/21/16 0638 10     Pain Loc --      Pain Edu? --      Excl. in [place]? --     Constitutional: Alert and oriented. Ill-appearing Eyes: Conjunctivae are normal.  . Nose: No congestion/rhinnorhea. Mouth/Throat: Mucous membranes are moist.    Cardiovascular: Normal rate, regular rhythm. Kermit Balo peripheral circulation. 2+ radial and ulnar pulses in the left hand, normal cap refill in all extremities. Lower extremities are warm and well perfused Respiratory: Increased respiratory effort, bibasilar rales, scattered wheezes, increased rest or effort with mild tachypnea Gastrointestinal: Soft and nontender. No distention.  No CVA tenderness. Genitourinary: No erythema or swelling Musculoskeletal: Warm and well perfused as above. Left anterior fasciotomy scar with mild erythema surrounding and swelling with granulation tissue, apparently this is being treated with antibiotics per the patient Neurologic:  Normal speech and language. No gross focal neurologic deficits are appreciated.  Skin:  Skin is warm, dry. Psychiatric: Mood and affect are normal. Speech and behavior are normal.  ____________________________________________   LABS (all labs ordered are listed, but only abnormal results are displayed)  Labs Reviewed  CULTURE, BLOOD (ROUTINE X 2)  CULTURE, BLOOD (ROUTINE X 2)  URINE CULTURE  LACTIC ACID, PLASMA  LACTIC ACID, PLASMA  COMPREHENSIVE METABOLIC PANEL  CBC WITH DIFFERENTIAL/PLATELET  URINALYSIS, ROUTINE W REFLEX MICROSCOPIC  INFLUENZA PANEL BY PCR (TYPE A & B)   ____________________________________________  EKG  ED ECG REPORT I, Lavonia Drafts, the  attending physician, personally viewed and interpreted this ECG.  Date: 10/21/2016  Rate: 70 Rhythm: normal sinus rhythm QRS Axis: normal Intervals: normal ST/T Wave abnormalities: normal Conduction Disturbances: Prolonged QTC   ____________________________________________  RADIOLOGY  Chest x-ray concerning for pneumonia versus edema ____________________________________________   PROCEDURES  Procedure(s) performed: No    Critical Care performed: No ____________________________________________   INITIAL IMPRESSION / ASSESSMENT AND PLAN / ED COURSE  Pertinent labs & imaging results that were available during my care of the patient were reviewed by me and considered in my medical decision making (see chart for details).  Patient presents with fever, shortness of breath, recent hospitalization. Strong suspicion for pneumonia although influenza is also a possibility as well as sepsis from wound infection. We will closely monitor the patient    Chest x-ray is concerning for pneumonia versus edema, given recent admission was in the healthcare associated pneumonia. I suspect pneumonia given fever. We will treat broadly with  IV cefepime and IV vancomycin. Patient will require admission to the hospital given his hypoxia and oxygen requirements. ____________________________________________   FINAL CLINICAL IMPRESSION(S) / ED DIAGNOSES  Final diagnoses:  HCAP (healthcare-associated pneumonia)      NEW MEDICATIONS STARTED DURING THIS VISIT:  New Prescriptions   No medications on file     Note:  This document was prepared using Dragon voice recognition software and may include unintentional dictation errors.    Lavonia Drafts, MD 10/21/16 [phone]

## 2016-10-21 NOTE — Consult Note (Signed)
ORTHOPAEDIC CONSULTATION  REQUESTING PHYSICIAN: Bettey Costa, MD  Chief Complaint:   Left wrist pain and swelling.  History of Present Illness: Darius Taylor is a 71 y.o. male with multiple medical problems including COPD, hypertension, coronary artery disease, and severe aortic stenosis who describes a 1 day history of left wrist pain with some swelling. He denies any falls or other injury to the wrist, but does state he has a history of gout. He presented to the emergency room today, primarily concerned with his breathing. The patient does have critical aortic stenosis with coronary artery disease. The patient is being admitted at this time for treatment of his cardiopulmonary issues. I have been asked to evaluate him for his left wrist pain. The patient denies any numbness or paresthesias to his hand, and denies any prior problems with his left wrist.  Past Medical History:  Diagnosis Date  . Aortic stenosis   . Asthma   . CAD (coronary artery disease)   . Cancer [facility])    colon, bladder and kidney  . COPD (chronic obstructive pulmonary disease) ([place])   . Hypertension    Past Surgical History:  Procedure Laterality Date  . CARDIAC DEFIBRILLATOR PLACEMENT    . CORONARY ANGIOPLASTY  09/25/2016  . KIDNEY SURGERY     left removed   Social History   Social History  . Marital status: Married    Spouse name: N/A  . Number of children: N/A  . Years of education: N/A   Social History Main Topics  . Smoking status: Former Research scientist (life sciences)  . Smokeless tobacco: Never Used  . Alcohol use No  . Drug use: Unknown  . Sexual activity: Not Asked   Other Topics Concern  . None   Social History Narrative  . None   No family history on file. No Known Allergies Prior to Admission medications   Medication Sig Start Date End Date Taking? Authorizing Provider  acetaminophen (TYLENOL) 325 MG tablet Take 325 mg by mouth every 6 (six)  hours as needed.   Yes Historical Provider, MD  albuterol (PROVENTIL) (2.5 MG/3ML) 0.083% nebulizer solution Take 2.5 mg by nebulization every 6 (six) hours as needed for wheezing or shortness of breath.   Yes Historical Provider, MD  amiodarone (PACERONE) 200 MG tablet Take 200 mg by mouth daily.   Yes Historical Provider, MD  apixaban (ELIQUIS) 5 MG TABS tablet Take 5 mg by mouth 2 (two) times daily.   Yes Historical Provider, MD  aspirin 81 MG chewable tablet Chew 81 mg by mouth daily.   Yes Historical Provider, MD  atorvastatin (LIPITOR) 80 MG tablet Take 80 mg by mouth daily.   Yes Historical Provider, MD  budesonide-formoterol (SYMBICORT) 160-4.5 MCG/ACT inhaler Inhale 2 puffs into the lungs 2 (two) times daily.   Yes Historical Provider, MD  cholecalciferol (VITAMIN D) 1000 units tablet Take 1,000 Units by mouth daily.   Yes Historical Provider, MD  docusate sodium (COLACE) 100 MG capsule Take 200 mg by mouth 2 (two) times daily as needed for mild constipation.   Yes Historical Provider, MD  doxycycline (VIBRA-TABS) 100 MG tablet Take 100 mg by mouth 2 (two) times daily. 10/11/16 10/25/16 Yes Historical Provider, MD  furosemide (LASIX) 40 MG tablet Take 40 mg by mouth 2 (two) times daily.   Yes Historical Provider, MD  magnesium oxide (MAG-OX) 400 MG tablet Take 400 mg by mouth 2 (two) times daily.   Yes Historical Provider, MD  metoprolol (TOPROL-XL) 200 MG 24 hr  tablet Take 200 mg by mouth daily.   Yes Historical Provider, MD  nitroGLYCERIN (NITROSTAT) 0.4 MG SL tablet Place 0.4 mg under the tongue every 5 (five) minutes as needed for chest pain.   Yes Historical Provider, MD  Probiotic Product (ALIGN) 4 MG CAPS Take 1 capsule by mouth daily.   Yes Historical Provider, MD   Dg Wrist Complete Left  Result Date: 10/21/2016 CLINICAL DATA:  Pain in left wrist. EXAM: LEFT WRIST - COMPLETE 3+ VIEW COMPARISON:  None. FINDINGS: Diffuse vascular calcifications. No acute bony abnormality. Specifically,  no fracture, subluxation, or dislocation. Soft tissues are intact. Joint spaces are maintained IMPRESSION: Diffuse vascular calcifications.  No acute bony abnormality. Electronically Signed   By: Rolm Baptise M.D.   On: 10/21/2016 09:33   Dg Chest Portable 1 View  Result Date: 10/21/2016 CLINICAL DATA:  Shortness of breath. EXAM: PORTABLE CHEST 1 VIEW COMPARISON:  10/19/2014 FINDINGS: Dual lead cardiac pacemaker is in stable position. The cardiac silhouette is enlarged. Mediastinal contours appear intact. Heavy calcific atherosclerotic disease of the aorta noted. There is no evidence of pleural effusion or pneumothorax. Wispy interstitial opacities with lower lobe predominance and mild bronchiectasis. Osseous structures are without acute abnormality. Soft tissues are grossly normal. IMPRESSION: Interstitial opacities with lower lobe predominance, left worse than right may represent pulmonary edema or atypical bronchopneumonia. Electronically Signed   By: Fidela Salisbury M.D.   On: 10/21/2016 07:42    Positive ROS: All other systems have been reviewed and were otherwise negative with the exception of those mentioned in the HPI and as above.  Physical Exam: General:  Alert, moderate respiratory distress Psychiatric:  Patient is competent for consent with normal mood and affect   Cardiovascular:  No pedal edema Respiratory:  Moderate wheezing and labored breathing GI:  Abdomen is soft and non-tender Skin:  No lesions in the area of chief complaint Neurologic:  Sensation intact distally Lymphatic:  No axillary or cervical lymphadenopathy  Orthopedic Exam:  Orthopedic examination is limited to the left wrist region. Skin inspection is notable for mild swelling with very mild erythema and warmth over the dorsal wrist region. He has moderate tenderness to palpation over the dorsal wrist region.Marland Kitchen He is able to extend the wrist actively to 40 and flex to 45 with only mild discomfort. He is able to  actively flex and extend all digits without difficulty, pain, or triggering. He is neurovascular intact to all digits of his left hand.  X-rays:  AP, lateral, oblique, and scaphoid views of the left wrist are available for review. These films demonstrate no evidence for fractures, lytic lesions, or significant degenerative changes. There is significant calcification of the radial artery noted.  Assessment: Acute left wrist pain and swelling, probably secondary to gout.  Plan: The treatment options were discussed with the patient. After obtaining verbal consent, an attempt was made to aspirate the left wrist using sterile technique. However, no fluid could be obtained. Based on the lack of fluid in the joint and his examination findings, I feel that most likely he has a gouty flare in the wrist. His white count is within normal limits at this time. He did have a temperature in the emergency room, but does have other potential sources for this fever. Therefore, these will need to be worked up as well. He will be fitted with a wrist splint to wear as necessary for comfort. He might benefit from by mouth or IV steroids if his other medical issues permit.  Thank you for ask me to participate in the care of this most pleasant unfortunate man. I will be happy to follow him with you.   Pascal Lux, MD  Beeper #:  [phone]  10/21/2016 11:57 AM

## 2016-10-22 LAB — BASIC METABOLIC PANEL
ANION GAP: 6 (ref 5–15)
BUN: 24 mg/dL — ABNORMAL HIGH (ref 6–20)
CHLORIDE: 96 mmol/L — AB (ref 101–111)
CO2: 32 mmol/L (ref 22–32)
CREATININE: 1.28 mg/dL — AB (ref 0.61–1.24)
Calcium: 9 mg/dL (ref 8.9–10.3)
GFR calc non Af Amer: 54 mL/min — ABNORMAL LOW (ref >60)
Glucose, Bld: 128 mg/dL — ABNORMAL HIGH (ref 65–99)
Potassium: 4.1 mmol/L (ref 3.5–5.1)
SODIUM: 134 mmol/L — AB (ref 135–145)

## 2016-10-22 LAB — URINE CULTURE: CULTURE: NO GROWTH

## 2016-10-22 LAB — CBC
HCT: 29 % — ABNORMAL LOW (ref 40.0–52.0)
HEMOGLOBIN: 10 g/dL — AB (ref 13.0–18.0)
MCH: 31.5 pg (ref 26.0–34.0)
MCHC: 34.7 g/dL (ref 32.0–36.0)
MCV: 90.8 fL (ref 80.0–100.0)
PLATELETS: 233 10*3/uL (ref 150–440)
RBC: 3.19 MIL/uL — AB (ref 4.40–5.90)
RDW: 20 % — ABNORMAL HIGH (ref 11.5–14.5)
WBC: 8 10*3/uL (ref 3.8–10.6)

## 2016-10-22 MED ORDER — PREDNISONE 50 MG PO TABS
50.0000 mg | ORAL_TABLET | Freq: Every day | ORAL | Status: DC
  Administered 2016-10-22 – 2016-10-26 (×4): 50 mg via ORAL
  Filled 2016-10-22 (×4): qty 1

## 2016-10-22 MED ORDER — OSELTAMIVIR PHOSPHATE 75 MG PO CAPS
75.0000 mg | ORAL_CAPSULE | Freq: Two times a day (BID) | ORAL | Status: DC
  Administered 2016-10-22 (×2): 75 mg via ORAL
  Filled 2016-10-22 (×2): qty 1

## 2016-10-22 MED ORDER — FUROSEMIDE 10 MG/ML IJ SOLN
40.0000 mg | Freq: Two times a day (BID) | INTRAMUSCULAR | Status: DC
  Administered 2016-10-22 – 2016-10-26 (×7): 40 mg via INTRAVENOUS
  Filled 2016-10-22 (×7): qty 4

## 2016-10-22 NOTE — Progress Notes (Signed)
Subjective: Patient notes left wrist pain improved this morning. He has no new complaints pertaining to the left wrist.   Objective: Vital signs in last 24 hours: Temp:  [98.6 F (37 C)-100 F (37.8 C)] 98.6 F (37 C) (01/29 0403) Pulse Rate:  [58-68] 63 (01/29 0403) Resp:  [18-25] 18 (01/29 0403) BP: (111-181)/(76-99) 150/83 (01/29 0403) SpO2:  [94 %-97 %] 96 % (01/29 0403) FiO2 (%):  [28 %] 28 % (01/29 0052)  Intake/Output from previous day: 01/28 0701 - 01/29 0700 In: 700 [IV Piggyback:700] Out: 625 [Urine:625] Intake/Output this shift: No intake/output data recorded.   Recent Labs  10/21/16 0645 10/22/16 0315  HGB 10.0* 10.0*    Recent Labs  10/21/16 0645 10/22/16 0315  WBC 8.9 8.0  RBC 3.23* 3.19*  HCT 29.7* 29.0*  PLT 250 233    Recent Labs  10/21/16 0645 10/22/16 0315  NA 136 134*  K 4.1 4.1  CL 97* 96*  CO2 30 32  BUN 20 24*  CREATININE 1.25* 1.28*  GLUCOSE 105* 128*  CALCIUM 9.2 9.0   No results for input(s): LABPT, INR in the last 72 hours.  Physical Exam: On examination of the left wrist, the swelling and erythema appears to be improved today. He is moving his wrist freely and without any discomfort. There is an most minimal tenderness to firm palpation over the dorsal wrist region. He is a black could flex and extend all digits. Sensation is intact to light touch to all digits. He has good capillary refill to all digits.  Assessment: Left wrist pain, presumably due to gouty flare, improved symptomatically.  Plan: The patient may wean out of the Velcro splint as symptoms permit. He may resume using the wrist and hand as symptoms permit. I will sign off at this time. If you have further questions or need further orthopedic input, please contact me.   Marshall Cork Devora Tortorella 10/22/2016, 8:05 AM

## 2016-10-22 NOTE — Progress Notes (Signed)
Changed from 2L [place] to 28% venturi mask at pt request.  States he feels the [place] makes him gag.

## 2016-10-22 NOTE — Progress Notes (Signed)
[name] [facility] [place] NAME: Darius Taylor    MR#:  [id number]  DATE OF BIRTH:  [birth year]  SUBJECTIVE:  CHIEF COMPLAINT:   Chief Complaint  Patient presents with  . Shortness of Breath   Shortness of breath improved. Coughing. On 3 status oxygen. Wheezing. Afebrile.  REVIEW OF SYSTEMS:    Review of Systems  Constitutional: Positive for malaise/fatigue. Negative for chills and fever.  HENT: Negative for sore throat.   Eyes: Negative for blurred vision, double vision and pain.  Respiratory: Positive for cough, shortness of breath and wheezing. Negative for hemoptysis.   Cardiovascular: Negative for chest pain, palpitations, orthopnea and leg swelling.  Gastrointestinal: Negative for abdominal pain, constipation, diarrhea, heartburn, nausea and vomiting.  Genitourinary: Negative for dysuria and hematuria.  Musculoskeletal: Positive for joint pain. Negative for back pain.  Skin: Negative for rash.  Neurological: Positive for weakness. Negative for sensory change, speech change, focal weakness and headaches.  Endo/Heme/Allergies: Does not bruise/bleed easily.  Psychiatric/Behavioral: Negative for depression. The patient is not nervous/anxious.    DRUG ALLERGIES:  No Known Allergies  VITALS:  Blood pressure (!) 150/83, pulse 63, temperature 98.6 F (37 C), temperature source Oral, resp. rate 18, height 5\' 10"  (1.778 m), weight 97.5 kg (215 lb), SpO2 96 %.  PHYSICAL EXAMINATION:   Physical Exam  GENERAL:  71 y.o.-year-old patient lying in the bed with no acute distress.  EYES: Pupils equal, round, reactive to light and accommodation. No scleral icterus. Extraocular muscles intact.  HEENT: Head atraumatic, normocephalic. Oropharynx and nasopharynx clear.  NECK:  Supple, no jugular venous distention. No thyroid enlargement, no tenderness.  LUNGS: increase work of breathing with bilateral wheezing and decreased air entry. CARDIOVASCULAR:  S1, S2 normal. No murmurs, rubs, or gallops.  ABDOMEN: Soft, nontender, nondistended. Bowel sounds present. No organomegaly or mass.  EXTREMITIES: No cyanosis, clubbing or edema b/l.    NEUROLOGIC: Cranial nerves II through XII are intact. No focal Motor or sensory deficits b/l.   PSYCHIATRIC: The patient is alert and oriented x 3.  SKIN: No obvious rash, lesion, or ulcer.   LABORATORY PANEL:   CBC  Recent Labs Lab 10/22/16 0315  WBC 8.0  HGB 10.0*  HCT 29.0*  PLT 233   ------------------------------------------------------------------------------------------------------------------ Chemistries   Recent Labs Lab 10/21/16 0645 10/22/16 0315  NA 136 134*  K 4.1 4.1  CL 97* 96*  CO2 30 32  GLUCOSE 105* 128*  BUN 20 24*  CREATININE 1.25* 1.28*  CALCIUM 9.2 9.0  AST 50*  --   ALT 29  --   ALKPHOS 91  --   BILITOT 1.2  --    ------------------------------------------------------------------------------------------------------------------  Cardiac Enzymes No results for input(s): TROPONINI in the last 168 hours. ------------------------------------------------------------------------------------------------------------------  RADIOLOGY:  Dg Wrist Complete Left  Result Date: 10/21/2016 CLINICAL DATA:  Pain in left wrist. EXAM: LEFT WRIST - COMPLETE 3+ VIEW COMPARISON:  None. FINDINGS: Diffuse vascular calcifications. No acute bony abnormality. Specifically, no fracture, subluxation, or dislocation. Soft tissues are intact. Joint spaces are maintained IMPRESSION: Diffuse vascular calcifications.  No acute bony abnormality. Electronically Signed   By: Rolm Baptise M.D.   On: 10/21/2016 09:33   Dg Chest Portable 1 View  Result Date: 10/21/2016 CLINICAL DATA:  Shortness of breath. EXAM: PORTABLE CHEST 1 VIEW COMPARISON:  10/19/2014 FINDINGS: Dual lead cardiac pacemaker is in stable position. The cardiac silhouette is enlarged. Mediastinal contours appear intact. Heavy calcific  atherosclerotic disease of the aorta  noted. There is no evidence of pleural effusion or pneumothorax. Wispy interstitial opacities with lower lobe predominance and mild bronchiectasis. Osseous structures are without acute abnormality. Soft tissues are grossly normal. IMPRESSION: Interstitial opacities with lower lobe predominance, left worse than right may represent pulmonary edema or atypical bronchopneumonia. Electronically Signed   By: Fidela Salisbury M.D.   On: 10/21/2016 07:42   ASSESSMENT AND PLAN:   71 year old male with a complicated cardiac history with most recent echocardiogram showing severe aortic stenosis Who presents with shortness of breath and left wrist pain.  1. Sepsis -  POA Patient will be treated for HCAP Continue broad spectrum antibiotics and follow up on final cultures Influenza A Positive  2. Acute hypoxic respiratory failure due to HCAP and acute on chronic systolic heart failure  Due to severe AS no ACEI or NTG. On IV lasix Wean O2 as tolerated. Continue cefepime and vancomycin Monitor intake and daily weight. Monitor BUN/Cr and K  3. Severe AS by ECHO 09/2016 at [facility] He is under evaluation at [facility]  4. COPD exacerbation Add prednisone Nebs PRN  5. Left wrist pain: x-ray of left wrist- nothing acute Orthopedics consulted. Aspiration yielded no fluid.  6. History of atrial fibrillation Continue amiodarone and Eliquis  7. CAD: Continue aspirin, atorvastatin, metoprolol   All the records are reviewed and case discussed with Care Management/Social Workerr. Management plans discussed with the patient, family and they are in agreement.  CODE STATUS: FULL CODE  DVT Prophylaxis: SCDs  TOTAL TIME TAKING CARE OF THIS PATIENT: 35 minutes.   POSSIBLE D/C IN 2-3 DAYS, DEPENDING ON CLINICAL CONDITION.  [name] R M.D on 10/22/2016 at 1:45 PM  Between 7am to 6pm - Pager - [phone]  After 6pm go to [email or web address] - password [email or web address]  [facility]  Office  [phone]  CC: Primary care physician; [place]  Note: This dictation was prepared with Dragon dictation along with smaller phrase technology. Any transcriptional errors that result from this process are unintentional.

## 2016-10-22 NOTE — Progress Notes (Signed)
[place]  SUBJECTIVE: still sob but improved over admission. Low grade temp   Vitals:   10/21/16 2357 10/22/16 0052 10/22/16 0403 10/22/16 0747  BP: (!) 181/94  (!) 150/83   Pulse: 63  63   Resp: (!) 24  18   Temp: 98.6 F (37 C)  98.6 F (37 C)   TempSrc: Oral  Oral   SpO2: 94% 95% 96% 96%  Weight:      Height:        Intake/Output Summary (Last 24 hours) at 10/22/16 1238 Last data filed at 10/22/16 0428  Gross per 24 hour  Intake              450 ml  Output              625 ml  Net             -175 ml    LABS: Basic Metabolic Panel:  Recent Labs  10/21/16 0645 10/22/16 0315  NA 136 134*  K 4.1 4.1  CL 97* 96*  CO2 30 32  GLUCOSE 105* 128*  BUN 20 24*  CREATININE 1.25* 1.28*  CALCIUM 9.2 9.0   Liver Function Tests:  Recent Labs  10/21/16 0645  AST 50*  ALT 29  ALKPHOS 91  BILITOT 1.2  PROT 7.9  ALBUMIN 3.4*   No results for input(s): LIPASE, AMYLASE in the last 72 hours. CBC:  Recent Labs  10/21/16 0645 10/22/16 0315  WBC 8.9 8.0  NEUTROABS 7.4*  --   HGB 10.0* 10.0*  HCT 29.7* 29.0*  MCV 92.0 90.8  PLT 250 233   Cardiac Enzymes: No results for input(s): CKTOTAL, CKMB, CKMBINDEX, TROPONINI in the last 72 hours. BNP: Invalid input(s): POCBNP D-Dimer: No results for input(s): DDIMER in the last 72 hours. Hemoglobin A1C: No results for input(s): HGBA1C in the last 72 hours. Fasting Lipid Panel: No results for input(s): CHOL, HDL, LDLCALC, TRIG, CHOLHDL, LDLDIRECT in the last 72 hours. Thyroid Function Tests: No results for input(s): TSH, T4TOTAL, T3FREE, THYROIDAB in the last 72 hours.  Invalid input(s): FREET3 Anemia Panel: No results for input(s): VITAMINB12, FOLATE, FERRITIN, TIBC, IRON, RETICCTPCT in the last 72 hours.   Physical Exam: Blood pressure (!) 150/83, pulse 63, temperature 98.6 F (37 C), temperature source Oral, resp. rate 18, height 5\' 10"  (1.778 m), weight 215 lb (97.5 kg),  SpO2 96 %.   Wt Readings from Last 1 Encounters:  10/21/16 215 lb (97.5 kg)     General appearance: alert and cooperative Resp: rhonchi bilaterally and wheezes bilaterally Chest wall: no tenderness Cardio: regular rate and rhythm GI: soft, non-tender; bowel sounds normal; no masses,  no organomegaly Extremities: edema 2+ edema Neurologic: Grossly normal Incision/Wound:fasciotomy incision on left lower leg.   TELEMETRY: Reviewed telemetry pt in apace v sense.   ASSESSMENT AND PLAN:  Active Problems:   HCAP (healthcare-associated pneumonia)-improving with abx. Still wheezing and sob.   AS-awaiting consideration for t[facility]r at [facility]. Will need infectious process improved prior to this.  Also has fasciotomy incision.   Conitnue to treat with abx. And carefully diurese. WIll change to po lasix after reaching euvolemia  Ventricular arrhythmia-no vf or vt .  Conitnue with amiodarone. AICD in place.      Teodoro Spray, MD, [facility] 10/22/2016 12:38 PM

## 2016-10-23 ENCOUNTER — Inpatient Hospital Stay: Payer: Medicare HMO

## 2016-10-23 DIAGNOSIS — L7632 Postprocedural hematoma of skin and subcutaneous tissue following other procedure: Secondary | ICD-10-CM

## 2016-10-23 LAB — BASIC METABOLIC PANEL
Anion gap: 6 (ref 5–15)
BUN: 29 mg/dL — AB (ref 6–20)
CHLORIDE: 95 mmol/L — AB (ref 101–111)
CO2: 32 mmol/L (ref 22–32)
Calcium: 8.8 mg/dL — ABNORMAL LOW (ref 8.9–10.3)
Creatinine, Ser: 1.34 mg/dL — ABNORMAL HIGH (ref 0.61–1.24)
GFR calc Af Amer: 59 mL/min — ABNORMAL LOW (ref >60)
GFR calc non Af Amer: 51 mL/min — ABNORMAL LOW (ref >60)
Glucose, Bld: 137 mg/dL — ABNORMAL HIGH (ref 65–99)
Potassium: 4.5 mmol/L (ref 3.5–5.1)
SODIUM: 133 mmol/L — AB (ref 135–145)

## 2016-10-23 LAB — VANCOMYCIN, TROUGH: Vancomycin Tr: 17 ug/mL (ref 15–20)

## 2016-10-23 MED ORDER — ALBUTEROL SULFATE (2.5 MG/3ML) 0.083% IN NEBU
2.5000 mg | INHALATION_SOLUTION | Freq: Four times a day (QID) | RESPIRATORY_TRACT | Status: DC
  Administered 2016-10-23 – 2016-10-26 (×13): 2.5 mg via RESPIRATORY_TRACT
  Filled 2016-10-23 (×13): qty 3

## 2016-10-23 MED ORDER — METOPROLOL SUCCINATE ER 25 MG PO TB24
25.0000 mg | ORAL_TABLET | Freq: Every day | ORAL | Status: DC
  Administered 2016-10-24 – 2016-10-26 (×3): 25 mg via ORAL
  Filled 2016-10-23 (×3): qty 1

## 2016-10-23 MED ORDER — OSELTAMIVIR PHOSPHATE 30 MG PO CAPS
30.0000 mg | ORAL_CAPSULE | Freq: Two times a day (BID) | ORAL | Status: DC
  Administered 2016-10-23 – 2016-10-25 (×4): 30 mg via ORAL
  Filled 2016-10-23 (×3): qty 1

## 2016-10-23 MED ORDER — ALBUTEROL SULFATE (2.5 MG/3ML) 0.083% IN NEBU: 2.5000 mg | INHALATION_SOLUTION | RESPIRATORY_TRACT | Status: DC | PRN

## 2016-10-23 NOTE — Progress Notes (Signed)
Patient due for a I&D of LLE tomorrow. Patient on Eloquis BID. Paged MD to clarify if med should be held. Waiting for return page at this time, passed on in report to night RN.

## 2016-10-23 NOTE — Progress Notes (Signed)
Pharmacy Antibiotic Note  Darius Taylor is a 71 y.o. male admitted on 10/21/2016 with  pneumonia/HCAP .  Pharmacy has been consulted for cefepime and vancomycin  Dosing. Patient received cafepime 2g and vancomycin 1gm IV x 1 dose in ED.  Plan: Ke: 0.058   T1/2: 12   VD: 67.9  Will start patient on vancomycin 1gm IV every 12 hours with 6 hours stack dosing. Calculated trough at Css is 15. Trough ordered prior to 5th dose. MRSA PCR ordered- if negative, recommend discontinuing vancomycin.   1/30 0151 vanc trough therapeutic. Continue current regimen. Pharmacy will continue to follow.  Will start patient on Cefepime 2gm IV every 12 hours.   Height: 5\' 10"  (177.8 cm) Weight: 215 lb (97.5 kg) IBW/kg (Calculated) : 73  Temp (24hrs), Avg:98.3 F (36.8 C), Min:97.7 F (36.5 C), Max:98.7 F (37.1 C)   Recent Labs Lab 10/21/16 0645 10/21/16 0709 10/21/16 1008 10/22/16 0315 10/23/16 0151  WBC 8.9  --   --  8.0  --   CREATININE 1.25*  --   --  1.28* 1.34*  LATICACIDVEN  --  1.1 0.9  --   --   VANCOTROUGH  --   --   --   --  17    Estimated Creatinine Clearance: 58.4 mL/min (by C-G formula based on SCr of 1.34 mg/dL (H)).    No Known Allergies  Antimicrobials this admission: 1/28 vancomycin>>  1/28 Cefepime >>   Dose adjustments this admission:  Microbiology results: 1/28  BCx: sent 1/28 UCx: sent 1/28  MRSA PCR: pending  Thank you for allowing pharmacy to be a part of this patient's care.  Laural Benes, PharmD, BCPS Clinical Pharmacist 10/23/2016 7:05 AM

## 2016-10-23 NOTE — Progress Notes (Signed)
Tamiflu renal dose adjustment per CrCl Normal treatment dose: 75 mg twice daily  Dose adjustment based on CrCl of > 30 to 60 ml/min: 30 mg twice daily  Patient now on 30 mg twice daily for 5 day course for influenza treatment.  Tobie Lords, PharmD, BCPS Clinical Pharmacist 10/23/2016

## 2016-10-23 NOTE — Progress Notes (Signed)
Patient having more exp. wheezes and increased SOB. Uncovered dressing to LLE, found some redness and edema to area around incision. Notified MD of above. New orders for scheduled nebs and [place] of LLE.

## 2016-10-23 NOTE — Progress Notes (Signed)
Per MD patient will D/C tomorrow if medically stable. Plan is for patient to return to [facility], where he is a long term resident. Doug admissions coordinator at [facility] is aware of above.   [employer], LCSW [phone]

## 2016-10-23 NOTE — Progress Notes (Signed)
[facility] [place] NAME: Darius Taylor    MR#:  [id number]  DATE OF BIRTH:  12-20-1945  SUBJECTIVE:  CHIEF COMPLAINT:   Chief Complaint  Patient presents with  . Shortness of Breath   He feels his breathing is worse today. More wheezing and cough. Clear sputum. Has swelling around his recent surgical wound. No discharge.  REVIEW OF SYSTEMS:    Review of Systems  Constitutional: Positive for malaise/fatigue. Negative for chills and fever.  HENT: Negative for sore throat.   Eyes: Negative for blurred vision, double vision and pain.  Respiratory: Positive for cough, shortness of breath and wheezing. Negative for hemoptysis.   Cardiovascular: Negative for chest pain, palpitations, orthopnea and leg swelling.  Gastrointestinal: Negative for abdominal pain, constipation, diarrhea, heartburn, nausea and vomiting.  Genitourinary: Negative for dysuria and hematuria.  Musculoskeletal: Positive for joint pain. Negative for back pain.  Skin: Negative for rash.  Neurological: Positive for weakness. Negative for sensory change, speech change, focal weakness and headaches.  Endo/Heme/Allergies: Does not bruise/bleed easily.  Psychiatric/Behavioral: Negative for depression. The patient is not nervous/anxious.    DRUG ALLERGIES:  No Known Allergies  VITALS:  Blood pressure 132/68, pulse 65, temperature 98.3 F (36.8 C), resp. rate 18, height 5\' 10"  (1.778 m), weight 97.5 kg (215 lb), SpO2 99 %.  PHYSICAL EXAMINATION:   Physical Exam  GENERAL:  71 y.o.-year-old patient lying in the bed with no acute distress.  EYES: Pupils equal, round, reactive to light and accommodation. No scleral icterus. Extraocular muscles intact.  HEENT: Head atraumatic, normocephalic. Oropharynx and nasopharynx clear.  NECK:  Supple, no jugular venous distention. No thyroid enlargement, no tenderness.  LUNGS: increase work of breathing with bilateral wheezing and  decreased air entry. CARDIOVASCULAR: S1, S2 normal. No murmurs, rubs, or gallops.  ABDOMEN: Soft, nontender, nondistended. Bowel sounds present. No organomegaly or mass.  EXTREMITIES: No cyanosis, clubbing or edema b/l.    Surgical scar on the medial site is healing well with no discharge. Swelling over the calf and lateral wound. No discharge. Warm and erythematous. NEUROLOGIC: Cranial nerves II through XII are intact. No focal Motor or sensory deficits b/l.   PSYCHIATRIC: The patient is alert and oriented x 3.  SKIN: No obvious rash, lesion, or ulcer.   LABORATORY PANEL:   CBC  Recent Labs Lab 10/22/16 0315  WBC 8.0  HGB 10.0*  HCT 29.0*  PLT 233   ------------------------------------------------------------------------------------------------------------------ Chemistries   Recent Labs Lab 10/21/16 0645  10/23/16 0151  NA 136  < > 133*  K 4.1  < > 4.5  CL 97*  < > 95*  CO2 30  < > 32  GLUCOSE 105*  < > 137*  BUN 20  < > 29*  CREATININE 1.25*  < > 1.34*  CALCIUM 9.2  < > 8.8*  AST 50*  --   --   ALT 29  --   --   ALKPHOS 91  --   --   BILITOT 1.2  --   --   < > = values in this interval not displayed. ------------------------------------------------------------------------------------------------------------------  Cardiac Enzymes No results for input(s): TROPONINI in the last 168 hours. ------------------------------------------------------------------------------------------------------------------  RADIOLOGY:  [place] Lt Lower Extrem Ltd Soft Tissue Non Vascular  Result Date: 10/23/2016 CLINICAL DATA:  71 year old male with left calf swelling post surgery for evacuation of left leg arterial blood clot 09/28/2016. Evaluate for possible abscess. Initial encounter. EXAM: ULTRASOUND left LOWER EXTREMITY LIMITED  TECHNIQUE: Ultrasound examination of the lower extremity soft tissues was performed in the area of clinical concern. COMPARISON:  CT angiogram 09/28/2016.  FINDINGS: 17.1 x 3.8 x 2.4 cm slightly complex fluid collection posterior to medial incision of the left calf which may represent abscess or postoperative hematoma. IMPRESSION: 17.1 x 3.8 x 2.4 cm slightly complex fluid collection posterior to medial incision of the left calf which may represent abscess or postoperative hematoma. These results will be called to the ordering clinician or representative by the Radiologist Assistant, and communication documented in the PACS or zVision Dashboard. Electronically Signed   By: Genia Del M.D.   On: 10/23/2016 12:06   ASSESSMENT AND PLAN:   71 year old male with a complicated cardiac history with most recent echocardiogram showing severe aortic stenosis Who presents with shortness of breath and left wrist pain.  * Left calf swelling Had recent surgery Check [place] for abcsess  Concern for large abcsess vs hematoma Consult surgery On IV abx  * Sepsis -  POA Patient will be treated for HCAP Continue broad spectrum antibiotics and follow up on final cultures Influenza A Positive  * Acute hypoxic respiratory failure due to HCAP and acute on chronic systolic heart failure  Due to severe AS no ACEI or NTG. On IV lasix Wean O2 as tolerated. Continue cefepime and vancomycin Monitor intake and daily weight. Monitor BUN/Cr and K  * Severe AS by ECHO 09/2016 at [facility] He is under evaluation at [facility]  * COPD exacerbation Added prednisone Nebs scheduled  * Left wrist pain: x-ray of left wrist- nothing acute Orthopedics consulted. Aspiration yielded no fluid.  * History of atrial fibrillation Continue amiodarone and Eliquis Metoprolol  * CAD: Continue aspirin, atorvastatin, metoprolol   All the records are reviewed and case discussed with Care Management/Social Workerr. Management plans discussed with the patient, family and they are in agreement.  CODE STATUS: FULL CODE  DVT Prophylaxis: SCDs  TOTAL TIME TAKING CARE OF THIS PATIENT: 35  minutes.   POSSIBLE D/C IN 2-3 DAYS, DEPENDING ON CLINICAL CONDITION.  [name] R M.D on 10/23/2016 at 1:47 PM  Between 7am to 6pm - Pager - [phone]  After 6pm go to [email or web address] - password [email or web address] [place] [facility]  Office  [phone]  CC: Primary care physician; [place]  Note: This dictation was prepared with Dragon dictation along with smaller phrase technology. Any transcriptional errors that result from this process are unintentional.

## 2016-10-23 NOTE — Consult Note (Signed)
Surgical Consultation  10/23/2016  Darius Taylor is an 71 y.o. male.   CC: Left calf hematoma HPI: This patient status post what appears to be a fasciotomy of his left calf following revascularization. This is done at [facility] and nose were records will be reviewed. He was admitted the hospital with shortness of breath and pneumonia and left arm tingling and weakness. He states that the pain in his left calf is not getting better but is not really worsening. Dressings are being changed routinely  Past Medical History:  Diagnosis Date  . Aortic stenosis   . Asthma   . CAD (coronary artery disease)   . Cancer [facility])    colon, bladder and kidney  . COPD (chronic obstructive pulmonary disease) ([place])   . Hypertension     Past Surgical History:  Procedure Laterality Date  . CARDIAC DEFIBRILLATOR PLACEMENT    . CORONARY ANGIOPLASTY  09/25/2016  . KIDNEY SURGERY     left removed    History reviewed. No pertinent family history.  Social History:  reports that he has quit smoking. He has never used smokeless tobacco. He reports that he does not drink alcohol. His drug history is not on file.  Allergies: No Known Allergies  Medications reviewed.   Review of Systems:   Review of Systems  Constitutional: Negative for chills and fever.  HENT: Negative.   Eyes: Negative.   Respiratory: Negative.   Cardiovascular: Negative.   Gastrointestinal: Negative.   Genitourinary: Negative.   Musculoskeletal: Positive for joint pain.  Skin: Negative.   Neurological: Positive for tingling and focal weakness.  Endo/Heme/Allergies: Bruises/bleeds easily.     Physical Exam:  BP (!) 184/66 (BP Location: Left Arm)   Pulse 68   Temp 98.1 F (36.7 C)   Resp 18   Ht '5\' 10"'$  (1.778 m)   Wt 215 lb (97.5 kg)   SpO2 92%   BMI 30.85 kg/m   Physical Exam  Constitutional: He is oriented to person, place, and time and well-developed, well-nourished, and in no distress. No distress.  HENT:   Head: Normocephalic and atraumatic.  Musculoskeletal: He exhibits edema, tenderness and deformity.  Left foot with good mobility of the toes and sensation skin is warm.  Fasciotomies lateral and medial the lateral wound is open and partially closed. The medial wound is ballotable without erythema and is completely closed and no expressible purulence but considerably tender especially posterior to the wound.  Neurological: He is alert and oriented to person, place, and time.  Skin: Skin is warm. He is not diaphoretic. No erythema.  Vitals reviewed.     Results for orders placed or performed during the hospital encounter of 10/21/16 (from the past 48 hour(s))  Basic metabolic panel     Status: Abnormal   Collection Time: 10/22/16  3:15 AM  Result Value Ref Range   Sodium 134 (L) 135 - 145 mmol/L   Potassium 4.1 3.5 - 5.1 mmol/L   Chloride 96 (L) 101 - 111 mmol/L   CO2 32 22 - 32 mmol/L   Glucose, Bld 128 (H) 65 - 99 mg/dL   BUN 24 (H) 6 - 20 mg/dL   Creatinine, Ser 1.28 (H) 0.61 - 1.24 mg/dL   Calcium 9.0 8.9 - 10.3 mg/dL   GFR calc non Af Amer 54 (L) >60 mL/min   GFR calc Af Amer >60 >60 mL/min    Comment: (NOTE) The eGFR has been calculated using the CKD EPI equation. This calculation has  not been validated in all clinical situations. eGFR's persistently <60 mL/min signify possible Chronic Kidney Disease.    Anion gap 6 5 - 15  CBC     Status: Abnormal   Collection Time: 10/22/16  3:15 AM  Result Value Ref Range   WBC 8.0 3.8 - 10.6 K/uL   RBC 3.19 (L) 4.40 - 5.90 MIL/uL   Hemoglobin 10.0 (L) 13.0 - 18.0 g/dL   HCT 29.0 (L) 40.0 - 52.0 %   MCV 90.8 80.0 - 100.0 fL   MCH 31.5 26.0 - 34.0 pg   MCHC 34.7 32.0 - 36.0 g/dL   RDW 20.0 (H) 11.5 - 14.5 %   Platelets 233 150 - 440 K/uL  Vancomycin, trough     Status: None   Collection Time: 10/23/16  1:51 AM  Result Value Ref Range   Vancomycin Tr 17 15 - 20 ug/mL  Basic metabolic panel     Status: Abnormal   Collection  Time: 10/23/16  1:51 AM  Result Value Ref Range   Sodium 133 (L) 135 - 145 mmol/L   Potassium 4.5 3.5 - 5.1 mmol/L   Chloride 95 (L) 101 - 111 mmol/L   CO2 32 22 - 32 mmol/L   Glucose, Bld 137 (H) 65 - 99 mg/dL   BUN 29 (H) 6 - 20 mg/dL   Creatinine, Ser 1.34 (H) 0.61 - 1.24 mg/dL   Calcium 8.8 (L) 8.9 - 10.3 mg/dL   GFR calc non Af Amer 51 (L) >60 mL/min   GFR calc Af Amer 59 (L) >60 mL/min    Comment: (NOTE) The eGFR has been calculated using the CKD EPI equation. This calculation has not been validated in all clinical situations. eGFR's persistently <60 mL/min signify possible Chronic Kidney Disease.    Anion gap 6 5 - 15   [place] Soft Tissue Non Vascular  Result Date: 10/23/2016 CLINICAL DATA:  71 year old male with left calf swelling post surgery for evacuation of left leg arterial blood clot 09/28/2016. Evaluate for possible abscess. Initial encounter. EXAM: ULTRASOUND left LOWER EXTREMITY LIMITED TECHNIQUE: Ultrasound examination of the lower extremity soft tissues was performed in the area of clinical concern. COMPARISON:  CT angiogram 09/28/2016. FINDINGS: 17.1 x 3.8 x 2.4 cm slightly complex fluid collection posterior to medial incision of the left calf which may represent abscess or postoperative hematoma. IMPRESSION: 17.1 x 3.8 x 2.4 cm slightly complex fluid collection posterior to medial incision of the left calf which may represent abscess or postoperative hematoma. These results will be called to the ordering clinician or representative by the Radiologist Assistant, and communication documented in the PACS or zVision Dashboard. Electronically Signed   By: Genia Del M.D.   On: 10/23/2016 12:06    Assessment/Plan:  This a patient with a history of vascular occlusion and a fasciotomy of the left lower extremity. One of the wounds was left open or partially opened the medial wound however is showing signs of a either seroma or hematoma. I see no sign of this  being an abscess but requires drainage due to the tenderness surrounding it.  Patient has multiple medical problems including cardiac and pulmonary disease but this can be done in the operating room with minimal sedation first by removing sutures and probing the wound and possibly placing a drain. This is discussed with the patient I will make him nothing by mouth after midnight I do not believe that the Eloquis needs to be stopped as these  are wounds that have been in place for some time.  Plan would be for operative examination under anesthesia (minimal anesthesia) and drainage of this seroma hematoma. Patient understood and agreed to proceed  Florene Glen, MD, FACS

## 2016-10-24 ENCOUNTER — Inpatient Hospital Stay: Payer: Medicare HMO | Admitting: Certified Registered Nurse Anesthetist

## 2016-10-24 ENCOUNTER — Encounter
Admission: EM | Disposition: A | Discharge status: Skilled Nursing Facility | Payer: Self-pay | Source: Home / Self Care | Attending: Internal Medicine

## 2016-10-24 ENCOUNTER — Encounter: Payer: Self-pay | Admitting: Anesthesiology

## 2016-10-24 DIAGNOSIS — T148XXA Other injury of unspecified body region, initial encounter: Secondary | ICD-10-CM

## 2016-10-24 HISTORY — PX: IRRIGATION AND DEBRIDEMENT HEMATOMA: SHX5254

## 2016-10-24 SURGERY — IRRIGATION AND DEBRIDEMENT HEMATOMA
Anesthesia: General | Laterality: Left | Wound class: Contaminated

## 2016-10-24 MED ORDER — LIDOCAINE HCL (CARDIAC) 20 MG/ML IV SOLN
INTRAVENOUS | Status: DC | PRN
  Administered 2016-10-24: 50 mg via INTRAVENOUS

## 2016-10-24 MED ORDER — LIDOCAINE HCL 1 % IJ SOLN
INTRAMUSCULAR | Status: DC | PRN
  Administered 2016-10-24: 10 mL

## 2016-10-24 MED ORDER — APIXABAN 5 MG PO TABS
5.0000 mg | ORAL_TABLET | Freq: Two times a day (BID) | ORAL | Status: DC
  Administered 2016-10-24 – 2016-10-26 (×4): 5 mg via ORAL
  Filled 2016-10-24 (×4): qty 1

## 2016-10-24 MED ORDER — PROPOFOL 500 MG/50ML IV EMUL
INTRAVENOUS | Status: AC
  Filled 2016-10-24: qty 50

## 2016-10-24 MED ORDER — FENTANYL CITRATE (PF) 100 MCG/2ML IJ SOLN: 25.0000 ug | INTRAMUSCULAR | Status: DC | PRN

## 2016-10-24 MED ORDER — FENTANYL CITRATE (PF) 100 MCG/2ML IJ SOLN
INTRAMUSCULAR | Status: AC
  Filled 2016-10-24: qty 2

## 2016-10-24 MED ORDER — PROPOFOL 10 MG/ML IV BOLUS
INTRAVENOUS | Status: AC
  Filled 2016-10-24: qty 20

## 2016-10-24 MED ORDER — PROPOFOL 10 MG/ML IV BOLUS
INTRAVENOUS | Status: DC | PRN
  Administered 2016-10-24: 10 mg via INTRAVENOUS
  Administered 2016-10-24: 20 mg via INTRAVENOUS

## 2016-10-24 MED ORDER — SODIUM CHLORIDE 0.9 % IV SOLN
INTRAVENOUS | Status: DC
  Administered 2016-10-24 – 2016-10-25 (×2): via INTRAVENOUS

## 2016-10-24 MED ORDER — FENTANYL CITRATE (PF) 100 MCG/2ML IJ SOLN
INTRAMUSCULAR | Status: DC | PRN
  Administered 2016-10-24 (×3): 25 ug via INTRAVENOUS

## 2016-10-24 MED ORDER — ONDANSETRON HCL 4 MG/2ML IJ SOLN: 4.0000 mg | Freq: Once | INTRAMUSCULAR | Status: DC | PRN

## 2016-10-24 MED ORDER — LIDOCAINE HCL (PF) 1 % IJ SOLN
INTRAMUSCULAR | Status: AC
  Filled 2016-10-24: qty 30

## 2016-10-24 MED ORDER — LIDOCAINE HCL (PF) 2 % IJ SOLN
INTRAMUSCULAR | Status: AC
  Filled 2016-10-24: qty 2

## 2016-10-24 MED ORDER — PROPOFOL 500 MG/50ML IV EMUL
INTRAVENOUS | Status: DC | PRN
  Administered 2016-10-24: 100 ug/kg/min via INTRAVENOUS

## 2016-10-24 MED ORDER — ONDANSETRON HCL 4 MG/2ML IJ SOLN
INTRAMUSCULAR | Status: AC
Start: 2016-10-24 — End: 2016-10-24
  Filled 2016-10-24: qty 2

## 2016-10-24 SURGICAL SUPPLY — 19 items
BANDAGE ACE 3X5.8 VEL STRL LF (GAUZE/BANDAGES/DRESSINGS) ×3 IMPLANT
BANDAGE ACE 4X5 VEL STRL LF (GAUZE/BANDAGES/DRESSINGS) ×3 IMPLANT
BNDG GAUZE 4.5X4.1 6PLY STRL (MISCELLANEOUS) ×6 IMPLANT
CHLORAPREP W/TINT 26ML (MISCELLANEOUS) ×3 IMPLANT
DRAIN PENROSE 1/4X12 LTX (DRAIN) ×3 IMPLANT
DRSG TELFA 3X8 NADH (GAUZE/BANDAGES/DRESSINGS) ×3 IMPLANT
ELECT REM PT RETURN 9FT ADLT (ELECTROSURGICAL) ×3
ELECTRODE REM PT RTRN 9FT ADLT (ELECTROSURGICAL) ×1 IMPLANT
GAUZE SPONGE 4X4 12PLY STRL (GAUZE/BANDAGES/DRESSINGS) ×6 IMPLANT
GLOVE BIO SURGEON STRL SZ8 (GLOVE) ×9 IMPLANT
GOWN STRL REUS W/ TWL LRG LVL3 (GOWN DISPOSABLE) ×3 IMPLANT
GOWN STRL REUS W/TWL LRG LVL3 (GOWN DISPOSABLE) ×6
KIT RM TURNOVER STRD PROC AR (KITS) ×3 IMPLANT
LABEL OR SOLS (LABEL) ×3 IMPLANT
NS IRRIG 500ML POUR BTL (IV SOLUTION) ×3 IMPLANT
PACK EXTREMITY ARMC (MISCELLANEOUS) ×3 IMPLANT
SPONGE LAP 18X18 5 PK (GAUZE/BANDAGES/DRESSINGS) ×3 IMPLANT
STOCKINETTE STRL 4IN 9604848 (GAUZE/BANDAGES/DRESSINGS) IMPLANT
SWAB CULTURE AMIES ANAERIB BLU (MISCELLANEOUS) ×3 IMPLANT

## 2016-10-24 NOTE — Progress Notes (Signed)
[facility] [place] NAME: Darius Taylor    MR#:  [id number]  DATE OF BIRTH:  1946/08/31  SUBJECTIVE:  CHIEF COMPLAINT:   Chief Complaint  Patient presents with  . Shortness of Breath   Still has cough. Clear sputum. Pain in left leg  OR later today  REVIEW OF SYSTEMS:    Review of Systems  Constitutional: Positive for malaise/fatigue. Negative for chills and fever.  HENT: Negative for sore throat.   Eyes: Negative for blurred vision, double vision and pain.  Respiratory: Positive for cough, shortness of breath and wheezing. Negative for hemoptysis.   Cardiovascular: Negative for chest pain, palpitations, orthopnea and leg swelling.  Gastrointestinal: Negative for abdominal pain, constipation, diarrhea, heartburn, nausea and vomiting.  Genitourinary: Negative for dysuria and hematuria.  Musculoskeletal: Positive for joint pain. Negative for back pain.  Skin: Negative for rash.  Neurological: Positive for weakness. Negative for sensory change, speech change, focal weakness and headaches.  Endo/Heme/Allergies: Does not bruise/bleed easily.  Psychiatric/Behavioral: Negative for depression. The patient is not nervous/anxious.    DRUG ALLERGIES:  No Known Allergies  VITALS:  Blood pressure 126/85, pulse 63, temperature 98.8 F (37.1 C), temperature source Tympanic, resp. rate 18, height 5\' 10"  (1.778 m), weight 97.5 kg (215 lb), SpO2 99 %.  PHYSICAL EXAMINATION:   Physical Exam  GENERAL:  71 y.o.-year-old patient lying in the bed with no acute distress.  EYES: Pupils equal, round, reactive to light and accommodation. No scleral icterus. Extraocular muscles intact.  HEENT: Head atraumatic, normocephalic. Oropharynx and nasopharynx clear.  NECK:  Supple, no jugular venous distention. No thyroid enlargement, no tenderness.  LUNGS: increase work of breathing with bilateral wheezing and decreased air entry. CARDIOVASCULAR: S1, S2  normal. No murmurs, rubs, or gallops.  ABDOMEN: Soft, nontender, nondistended. Bowel sounds present. No organomegaly or mass.  EXTREMITIES: No cyanosis, clubbing or edema b/l.    Surgical scar on the medial site is healing well with no discharge. Swelling over the calf and lateral wound. No discharge. Warm and erythematous. NEUROLOGIC: Cranial nerves II through XII are intact. No focal Motor or sensory deficits b/l.   PSYCHIATRIC: The patient is alert and oriented x 3.  SKIN: No obvious rash, lesion, or ulcer.   LABORATORY PANEL:   CBC  Recent Labs Lab 10/22/16 0315  WBC 8.0  HGB 10.0*  HCT 29.0*  PLT 233   ------------------------------------------------------------------------------------------------------------------ Chemistries   Recent Labs Lab 10/21/16 0645  10/23/16 0151  NA 136  < > 133*  K 4.1  < > 4.5  CL 97*  < > 95*  CO2 30  < > 32  GLUCOSE 105*  < > 137*  BUN 20  < > 29*  CREATININE 1.25*  < > 1.34*  CALCIUM 9.2  < > 8.8*  AST 50*  --   --   ALT 29  --   --   ALKPHOS 91  --   --   BILITOT 1.2  --   --   < > = values in this interval not displayed. ------------------------------------------------------------------------------------------------------------------  Cardiac Enzymes No results for input(s): TROPONINI in the last 168 hours. ------------------------------------------------------------------------------------------------------------------  RADIOLOGY:  [place] Lt Lower Extrem Ltd Soft Tissue Non Vascular  Result Date: 10/23/2016 CLINICAL DATA:  71 year old male with left calf swelling post surgery for evacuation of left leg arterial blood clot 09/28/2016. Evaluate for possible abscess. Initial encounter. EXAM: ULTRASOUND left LOWER EXTREMITY LIMITED TECHNIQUE: Ultrasound examination of the lower  extremity soft tissues was performed in the area of clinical concern. COMPARISON:  CT angiogram 09/28/2016. FINDINGS: 17.1 x 3.8 x 2.4 cm slightly complex fluid  collection posterior to medial incision of the left calf which may represent abscess or postoperative hematoma. IMPRESSION: 17.1 x 3.8 x 2.4 cm slightly complex fluid collection posterior to medial incision of the left calf which may represent abscess or postoperative hematoma. These results will be called to the ordering clinician or representative by the Radiologist Assistant, and communication documented in the PACS or zVision Dashboard. Electronically Signed   By: Genia Del M.D.   On: 10/23/2016 12:06   ASSESSMENT AND PLAN:   71 year old male with a complicated cardiac history with most recent echocardiogram showing severe aortic stenosis Who presents with shortness of breath and left wrist pain.  * Left calf swelling Had recent surgery US showed possible abscess vs Hematoma Consulted surgery. OR today On IV abx  * Sepsis -  POA Patient will be treated for HCAP Continue broad spectrum antibiotics Influenza A Positive  * Acute hypoxic respiratory failure due to HCAP and acute on chronic systolic heart failure  Due to severe AS no ACEI or NTG. On IV lasix. Wean O2 as tolerated. Continue cefepime and vancomycin Monitor intake and daily weight. Monitor BUN/Cr and K  * Severe AS by ECHO 09/2016 at [facility] He is under evaluation at [facility]  Has appt on 10/31/2016  * COPD exacerbation On prednisone, IV abx Nebs scheduled  * Left wrist pain: x-ray of left wrist- nothing acute Orthopedics consulted. Aspiration yielded no fluid.  * History of atrial fibrillation Continue amiodarone and Eliquis Metoprolol  * CAD: Continue aspirin, atorvastatin, metoprolol  All the records are reviewed and case discussed with Care Management/Social Workerr. Management plans discussed with the patient, family and they are in agreement.  CODE STATUS: FULL CODE  DVT Prophylaxis: SCDs  TOTAL TIME TAKING CARE OF THIS PATIENT: 30 minutes.   POSSIBLE D/C IN 2-3 DAYS, DEPENDING ON CLINICAL  CONDITION.  [name] R M.D on 10/24/2016 at 12:55 PM  Between 7am to 6pm - Pager - [phone]  After 6pm go to [email or web address] - password [email or web address] [place] [facility]  Office  [phone]  CC: Primary care physician; [place]  Note: This dictation was prepared with Dragon dictation along with smaller phrase technology. Any transcriptional errors that result from this process are unintentional.

## 2016-10-24 NOTE — Anesthesia Preprocedure Evaluation (Addendum)
Anesthesia Evaluation  Patient identified by MRN, date of birth, ID band Patient awake    Reviewed: Allergy & Precautions, NPO status , Patient's Chart, lab work & pertinent test results, reviewed documented beta blocker date and time   Airway Mallampati: III  TM Distance: >3 FB     Dental  (+) Chipped   Pulmonary asthma , pneumonia, resolved, COPD, former smoker,           Cardiovascular hypertension, Pt. on medications + CAD  + Cardiac Defibrillator      Neuro/Psych    GI/Hepatic   Endo/Other    Renal/GU      Musculoskeletal   Abdominal   Peds  Hematology   Anesthesia Other Findings EKG reviewed. 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 Paced beats.  Reproductive/Obstetrics                            Anesthesia Physical Anesthesia Plan  ASA: IV  Anesthesia Plan: MAC   Post-op Pain Management:    Induction:   Airway Management Planned: Nasal Cannula  Additional Equipment:   Intra-op Plan:   Post-operative Plan:   Informed Consent: I have reviewed the patients History and Physical, chart, labs and discussed the procedure including the risks, benefits and alternatives for the proposed anesthesia with the patient or authorized representative who has indicated his/her understanding and acceptance.     Plan Discussed with: CRNA  Anesthesia Plan Comments:         Anesthesia Quick Evaluation

## 2016-10-24 NOTE — Anesthesia Postprocedure Evaluation (Signed)
Anesthesia Post Note  Patient: Darius Taylor  Procedure(s) Performed: Procedure(s) (LRB): IRRIGATION AND DEBRIDEMENT HEMATOMA (Left)  Patient location during evaluation: PACU Anesthesia Type: General and MAC Pain management: pain level controlled Vital Signs Assessment: post-procedure vital signs reviewed and stable Respiratory status: spontaneous breathing Cardiovascular status: stable Anesthetic complications: no     Last Vitals:  Vitals:   10/24/16 1400 10/24/16 1415  BP: (!) 159/68 (!) 156/75  Pulse: 62 63  Resp: 17 20  Temp:  36.1 C    Last Pain:  Vitals:   10/24/16 1415  TempSrc:   PainSc: Asleep                 VAN STAVEREN,Xara Paulding

## 2016-10-24 NOTE — Anesthesia Post-op Follow-up Note (Cosign Needed)
Anesthesia QCDR form completed.        

## 2016-10-24 NOTE — Op Note (Signed)
10/21/2016 - 10/24/2016  1:35 PM  PATIENT:  Darius Taylor  71 y.o. male  PRE-OPERATIVE DIAGNOSIS:  Left calf hematoma POST-OPERATIVE DIAGNOSIS:  Same  PROCEDURE: Drainage of left calf hematoma  SURGEON:  Florene Glen MD, FACS   ANESTHESIA:   Local/MAC   Details of Procedure: This patient has had fasciotomies for decompression following revascularization at [facility]. He has developed what appears to be a complex hematoma or abscess posterior to the left leg medial fasciotomy. This medial fasciotomy has been closed with nylon sutures the lateral fasciotomy is partially closed partially open and granulating. Patient requires examination under anesthesia and drainage of a complex probable hematoma versus abscess seen on ultrasound.  Informed consent had been obtained concerning the options of observation and the risks of bleeding infection recurrence and open wound and drain placement he understood and agreed to proceed this is all reviewed for him in the preop holding area.  Patient was taken to the operating room and induced to monitored anesthesia care. A surgical pause was performed and the dressing was removed inspection of the lateral fasciotomy showed that sutures cephalad and caudad were intact and the midportion of the incision was granulating without signs of infection.  The medial fasciotomy was closed with nylon sutures. There was some oozing coming from between the sutures and therefore will some local anesthetic was infiltrated into this area and then sutures were removed hematoma was identified. Aspiration with an 18-gauge needle somewhat posterior this demonstrated hematoma material. More sutures were removed cephalad and aspiration and irrigation of a large hematoma that extended somewhat cephalad above the incision was performed. There was minimal amount of bleeding from the raw muscle edges. Cultures were obtained of the area prior to complete  evacuation.  The Penrose drain was placed into the cavity and held in with 3-0 nylon's. A few figure-of-eight 3-0 nylon's were placed to loosely approximate skin over the Penrose drain. There was minimal bleeding at this point. At this point sterile dressings were placed consisting of 4 x 4's ABG pads Kerlix and Ace wrap.  Patient out of the procedure well the workup occasions he was taken to recovery room in stable condition to be admitted for continued care with plans to leave the dressing intact until tomorrow and to elevate his leg on 2 pillows. Ulcers are pending. Sponge lap needle count was correct.   Florene Glen, MD FACS

## 2016-10-24 NOTE — Progress Notes (Signed)
Patient met in the preop holding area. He is marked on his left leg for incision and drainage of hematoma or seroma or abscess of the left fasciotomy site.  The rationale for surgery was discussed with the patient anesthesia has yet to see the patient but we discussed IV sedation only.  I discussed with the patient the risks of bleeding infection recurrence the potential for simple aspiration versus suture removal versus drain placement or wound VAC placement he has had a wound VAC in the past. Multiple questions were answered for him he understood and agreed to proceed.  I had originally thought about stopping his Eloquis but because of the urgency of this because of the question of infection versus seroma or hematoma this could not wait 48 hours and therefore stopping Eloquis was a moot point. We will restart all medications postoperatively.

## 2016-10-24 NOTE — Transfer of Care (Signed)
Immediate Anesthesia Transfer of Care Note  Patient: Darius Taylor  Procedure(s) Performed: Procedure(s): IRRIGATION AND DEBRIDEMENT HEMATOMA (Left)  Patient Location: PACU  Anesthesia Type:General  Level of Consciousness: sedated  Airway & Oxygen Therapy: Patient Spontanous Breathing and Patient connected to nasal cannula oxygen  Post-op Assessment: Report given to RN and Post -op Vital signs reviewed and stable  Post vital signs: Reviewed and stable  Last Vitals:  Vitals:   10/24/16 1206 10/24/16 1330  BP: 126/85 (!) 161/61  Pulse: 63 63  Resp: 18 18  Temp: 37.1 C 36.4 C    Last Pain:  Vitals:   10/24/16 1206  TempSrc: Tympanic  PainSc: 2          Complications: No apparent anesthesia complications

## 2016-10-24 NOTE — Anesthesia Procedure Notes (Signed)
Date/Time: 10/24/2016 12:46 PM Performed by: Johnna Acosta Pre-anesthesia Checklist: Patient identified, Emergency Drugs available, Suction available and Patient being monitored Patient Re-evaluated:Patient Re-evaluated prior to inductionOxygen Delivery Method: Nasal cannula

## 2016-10-25 LAB — BASIC METABOLIC PANEL
Anion gap: 5 (ref 5–15)
BUN: 38 mg/dL — ABNORMAL HIGH (ref 6–20)
CALCIUM: 9 mg/dL (ref 8.9–10.3)
CO2: 34 mmol/L — AB (ref 22–32)
CREATININE: 1.22 mg/dL (ref 0.61–1.24)
Chloride: 97 mmol/L — ABNORMAL LOW (ref 101–111)
GFR calc non Af Amer: 57 mL/min — ABNORMAL LOW (ref >60)
Glucose, Bld: 104 mg/dL — ABNORMAL HIGH (ref 65–99)
Potassium: 4.6 mmol/L (ref 3.5–5.1)
SODIUM: 136 mmol/L (ref 135–145)

## 2016-10-25 LAB — CBC WITH DIFFERENTIAL/PLATELET
BASOS PCT: 1 %
Basophils Absolute: 0 10*3/uL (ref 0–0.1)
EOS ABS: 0.1 10*3/uL (ref 0–0.7)
Eosinophils Relative: 2 %
HCT: 27.3 % — ABNORMAL LOW (ref 40.0–52.0)
Hemoglobin: 9 g/dL — ABNORMAL LOW (ref 13.0–18.0)
Lymphocytes Relative: 13 %
Lymphs Abs: 0.8 10*3/uL — ABNORMAL LOW (ref 1.0–3.6)
MCH: 30.7 pg (ref 26.0–34.0)
MCHC: 33 g/dL (ref 32.0–36.0)
MCV: 93.3 fL (ref 80.0–100.0)
MONO ABS: 0.7 10*3/uL (ref 0.2–1.0)
MONOS PCT: 12 %
Neutro Abs: 4.4 10*3/uL (ref 1.4–6.5)
Neutrophils Relative %: 72 %
PLATELETS: 203 10*3/uL (ref 150–440)
RBC: 2.92 MIL/uL — ABNORMAL LOW (ref 4.40–5.90)
RDW: 20.3 % — ABNORMAL HIGH (ref 11.5–14.5)
WBC: 6 10*3/uL (ref 3.8–10.6)

## 2016-10-25 LAB — MAGNESIUM: Magnesium: 2.1 mg/dL (ref 1.7–2.4)

## 2016-10-25 MED ORDER — OSELTAMIVIR PHOSPHATE 75 MG PO CAPS
75.0000 mg | ORAL_CAPSULE | Freq: Two times a day (BID) | ORAL | Status: DC
  Administered 2016-10-25 – 2016-10-26 (×2): 75 mg via ORAL
  Filled 2016-10-25 (×2): qty 1

## 2016-10-25 NOTE — Progress Notes (Signed)
Pt requested and received robitussin po at this time for cough. Visitors at bedside. Earlier, pt independently returned to bed from bedside commode. Pt in no distress. Call bell in reach. This Probation officer received report from [place] at 1500.

## 2016-10-25 NOTE — Progress Notes (Signed)
1 Day Post-Op  Subjective: Status post drainage of a hematoma following fasciotomy of the left calf. Patient states he feels much better with less pain  Objective: Vital signs in last 24 hours: Temp:  [97 F (36.1 C)-98.9 F (37.2 C)] 98.2 F (36.8 C) (02/01 0430) Pulse Rate:  [59-63] 59 (02/01 0430) Resp:  [17-20] 19 (02/01 0430) BP: (123-161)/(61-85) 137/63 (02/01 0430) SpO2:  [95 %-100 %] 97 % (02/01 0840) Weight:  [215 lb (97.5 kg)] 215 lb (97.5 kg) (01/31 1206) Last BM Date: 10/24/16  Intake/Output from previous day: 01/31 0701 - 02/01 0700 In: 400 [I.V.:400] Out: 1635 [Urine:1625; Blood:10] Intake/Output this shift: No intake/output data recorded.  Physical exam:  Dressing is taken down wound is clean there is no erythema no drainage surprisingly there was very little bleeding Penrose is in place the area is much less tender than preop. Purulence  Lab Results: CBC   Recent Labs  10/25/16 0354  WBC 6.0  HGB 9.0*  HCT 27.3*  PLT 203   BMET  Recent Labs  10/23/16 0151 10/25/16 0354  NA 133* 136  K 4.5 4.6  CL 95* 97*  CO2 32 34*  GLUCOSE 137* 104*  BUN 29* 38*  CREATININE 1.34* 1.22  CALCIUM 8.8* 9.0   PT/INR No results for input(s): LABPROT, INR in the last 72 hours. ABG No results for input(s): PHART, HCO3 in the last 72 hours.  Invalid input(s): PCO2, PO2  Studies/Results: [place] Soft Tissue Non Vascular  Result Date: 10/23/2016 CLINICAL DATA:  71 year old male with left calf swelling post surgery for evacuation of left leg arterial blood clot 09/28/2016. Evaluate for possible abscess. Initial encounter. EXAM: ULTRASOUND left LOWER EXTREMITY LIMITED TECHNIQUE: Ultrasound examination of the lower extremity soft tissues was performed in the area of clinical concern. COMPARISON:  CT angiogram 09/28/2016. FINDINGS: 17.1 x 3.8 x 2.4 cm slightly complex fluid collection posterior to medial incision of the left calf which may represent  abscess or postoperative hematoma. IMPRESSION: 17.1 x 3.8 x 2.4 cm slightly complex fluid collection posterior to medial incision of the left calf which may represent abscess or postoperative hematoma. These results will be called to the ordering clinician or representative by the Radiologist Assistant, and communication documented in the PACS or zVision Dashboard. Electronically Signed   By: Genia Del M.D.   On: 10/23/2016 12:06    Anti-infectives: Anti-infectives    Start     Dose/Rate Route Frequency Ordered Stop   10/23/16 1000  oseltamivir (TAMIFLU) capsule 30 mg     30 mg Oral 2 times daily 10/23/16 0954 10/27/16 0959   10/22/16 1000  oseltamivir (TAMIFLU) capsule 75 mg  Status:  Discontinued     75 mg Oral 2 times daily 10/22/16 0820 10/23/16 0954   10/21/16 2000  ceFEPIme (MAXIPIME) 2 GM / 59mL IVPB premix  Status:  Discontinued     2 g 100 mL/hr over 30 Minutes Intravenous Every 12 hours 10/21/16 1034 10/21/16 1205   10/21/16 2000  ceFEPIme (MAXIPIME) 2 g in dextrose 5 % 50 mL IVPB     2 g 100 mL/hr over 30 Minutes Intravenous Every 12 hours 10/21/16 1205     10/21/16 1400  vancomycin (VANCOCIN) IVPB 1000 mg/200 mL premix     1,000 mg 200 mL/hr over 60 Minutes Intravenous Every 12 hours 10/21/16 1034     10/21/16 0830  ceFEPIme (MAXIPIME) 2 g in dextrose 5 % 50 mL IVPB  2 g 100 mL/hr over 30 Minutes Intravenous  Once 10/21/16 0823 10/21/16 0840   10/21/16 0815  ceFEPIme (MAXIPIME) 2 GM / 37mL IVPB premix  Status:  Discontinued     2 g 100 mL/hr over 30 Minutes Intravenous  Once 10/21/16 0808 10/21/16 0823   10/21/16 0815  vancomycin (VANCOCIN) IVPB 1000 mg/200 mL premix     1,000 mg 200 mL/hr over 60 Minutes Intravenous  Once 10/21/16 0808 10/21/16 1011      Assessment/Plan: s/p Procedure(s): IRRIGATION AND DEBRIDEMENT HEMATOMA   Cultures are negative so far with no organisms on Gram stain. Patient doing very well no sign of infection at this point with Penrose in  place. Patient could be discharged at any time to follow-up in my office next week  Florene Glen, MD, FACS  10/25/2016

## 2016-10-25 NOTE — Progress Notes (Signed)
Patient weaned from [facility] to [facility] and tolerating well.  02 saturations at 93% on 1L at rest.  Continue to monitor.  Patient states he does not wear any oxygen at home.

## 2016-10-25 NOTE — Progress Notes (Signed)
[place] at [place] NAME: Darius Taylor    MR#:  [id number]  DATE OF BIRTH:  1946-01-19  SUBJECTIVE:  CHIEF COMPLAINT:   Chief Complaint  Patient presents with  . Shortness of Breath   No pain in his leg. Breathing improving. Continues to have cough and wheezing. Clear sputum. REVIEW OF SYSTEMS:    Review of Systems  Constitutional: Positive for malaise/fatigue. Negative for chills and fever.  HENT: Negative for sore throat.   Eyes: Negative for blurred vision, double vision and pain.  Respiratory: Positive for cough, shortness of breath and wheezing. Negative for hemoptysis.   Cardiovascular: Negative for chest pain, palpitations, orthopnea and leg swelling.  Gastrointestinal: Negative for abdominal pain, constipation, diarrhea, heartburn, nausea and vomiting.  Genitourinary: Negative for dysuria and hematuria.  Musculoskeletal: Positive for joint pain. Negative for back pain.  Skin: Negative for rash.  Neurological: Positive for weakness. Negative for sensory change, speech change, focal weakness and headaches.  Endo/Heme/Allergies: Does not bruise/bleed easily.  Psychiatric/Behavioral: Negative for depression. The patient is not nervous/anxious.    DRUG ALLERGIES:  No Known Allergies  VITALS:  Blood pressure 137/63, pulse (!) 59, temperature 98.2 F (36.8 C), temperature source Oral, resp. rate 19, height 5\' 10"  (1.778 m), weight 97.5 kg (215 lb), SpO2 93 %.  PHYSICAL EXAMINATION:   Physical Exam  GENERAL:  70 y.o.-year-old patient lying in the bed with no acute distress.  EYES: Pupils equal, round, reactive to light and accommodation. No scleral icterus. Extraocular muscles intact.  HEENT: Head atraumatic, normocephalic. Oropharynx and nasopharynx clear.  NECK:  Supple, no jugular venous distention. No thyroid enlargement, no tenderness.  LUNGS: increase work of breathing with bilateral wheezing and decreased air  entry. CARDIOVASCULAR: S1, S2 normal. No murmurs, rubs, or gallops.  ABDOMEN: Soft, nontender, nondistended. Bowel sounds present. No organomegaly or mass.  EXTREMITIES: No cyanosis, clubbing or edema b/l.    Surgical scar on the medial site is healing well with no discharge. Swelling over the calf and lateral wound. No discharge. Warm and erythematous. NEUROLOGIC: Cranial nerves II through XII are intact. No focal Motor or sensory deficits b/l.   PSYCHIATRIC: The patient is alert and oriented x 3.  SKIN: No obvious rash, lesion, or ulcer.   LABORATORY PANEL:   CBC  Recent Labs Lab 10/25/16 0354  WBC 6.0  HGB 9.0*  HCT 27.3*  PLT 203   ------------------------------------------------------------------------------------------------------------------ Chemistries   Recent Labs Lab 10/21/16 0645  10/25/16 0354  NA 136  < > 136  K 4.1  < > 4.6  CL 97*  < > 97*  CO2 30  < > 34*  GLUCOSE 105*  < > 104*  BUN 20  < > 38*  CREATININE 1.25*  < > 1.22  CALCIUM 9.2  < > 9.0  MG  --   --  2.1  AST 50*  --   --   ALT 29  --   --   ALKPHOS 91  --   --   BILITOT 1.2  --   --   < > = values in this interval not displayed. ------------------------------------------------------------------------------------------------------------------  Cardiac Enzymes No results for input(s): TROPONINI in the last 168 hours. ------------------------------------------------------------------------------------------------------------------  RADIOLOGY:  No results found. ASSESSMENT AND PLAN:   71 year old male with a complicated cardiac history with most recent echocardiogram showing severe aortic stenosis Who presents with shortness of breath and left wrist pain.  * Left calf swelling Had recent  surgery US showed possible abscess vs Hematoma No pus on I n D. Hematoma. Drain in place.  * Sepsis -  POA Patient will be treated for HCAP Continue broad spectrum antibiotics Influenza A  Positive  * Acute hypoxic respiratory failure due to HCAP and acute on chronic systolic heart failure  Due to severe AS no ACEI or NTG. On IV lasix. Wean O2 as tolerated. Continue cefepime and vancomycin Monitor intake and daily weight. Monitor BUN/Cr and K  * Severe AS by ECHO 09/2016 at [facility] He is under evaluation at [facility].  Has appt on 10/31/2016  * COPD exacerbation On prednisone and antibiotics Nebs scheduled  * Left wrist pain: x-ray of left wrist- nothing acute Orthopedics consulted. Aspiration yielded no fluid.  * History of atrial fibrillation Continue amiodarone and Eliquis Metoprolol  * CAD: Continue aspirin, atorvastatin, metoprolol  All the records are reviewed and case discussed with Care Management/Social Workerr. Management plans discussed with the patient, family and they are in agreement.  CODE STATUS: FULL CODE  DVT Prophylaxis: SCDs  TOTAL TIME TAKING CARE OF THIS PATIENT: 30 minutes.   POSSIBLE D/C IN 1-2 DAYS, DEPENDING ON CLINICAL CONDITION.  [name] R M.D on 10/25/2016 at 12:34 PM  Between 7am to 6pm - Pager - [phone]  After 6pm go to [email or web address] - password [email or web address] [facility]  Office  [phone]  CC: Primary care physician; [place]  Note: This dictation was prepared with Dragon dictation along with smaller phrase technology. Any transcriptional errors that result from this process are unintentional.

## 2016-10-25 NOTE — Progress Notes (Signed)
Tamiflu renal dose adjustment per CrCl Normal treatment dose: 75 mg twice daily  Dose adjustment based on CrCl of > 30 to 60 ml/min: 30 mg twice daily  Patient now on 30 mg twice daily for 5 day course for influenza treatment.  Will revert back to normal treatment dosing of 75 mg twice daily since CrCl > 60 ml/min.  Tobie Lords, PharmD, BCPS Clinical Pharmacist 10/25/2016

## 2016-10-25 NOTE — Progress Notes (Signed)
Dr. Burt Knack removed dressing.  Minimal blood drainage coming from incision site.  Redressed patients leg.

## 2016-10-25 NOTE — Progress Notes (Signed)
Plan is for patient to D/C back to [facility], where he is a long term care resident tomorrow if stable. Doug admissions coordinator at [facility] is aware of above.   [employer], LCSW [phone]

## 2016-10-26 ENCOUNTER — Inpatient Hospital Stay: Payer: Medicare HMO

## 2016-10-26 LAB — CULTURE, BLOOD (ROUTINE X 2)
CULTURE: NO GROWTH
Culture: NO GROWTH

## 2016-10-26 MED ORDER — PREDNISONE 20 MG PO TABS: 20.0000 mg | ORAL_TABLET | Freq: Every day | ORAL | 0 refills | Status: DC

## 2016-10-26 MED ORDER — FUROSEMIDE 40 MG PO TABS: 40.0000 mg | ORAL_TABLET | Freq: Two times a day (BID) | ORAL | Status: DC

## 2016-10-26 MED ORDER — GUAIFENESIN 100 MG/5ML PO SOLN: 5.0000 mL | ORAL | 0 refills | Status: DC | PRN

## 2016-10-26 NOTE — Progress Notes (Signed)
Shift assessment completed at 0745.pt is awake, c/o nausea. O2 on at [facility], lungs with ronchi and expiratory wheezes noted. Hr is regular, abdomen is soft, bs heard. Pt is voiding in urinal prn,Lpp is faint, foot is warm, dressing intact, cap refill is wnl. R foot ppp, no edema . Pt denied pain. PIV #20 to l fa found to be infiltrated when flushed, this writer removed with catheter intact and placed piv #22 to pt's rac on second attempt, pt tolerated well. Pt then received zofran iv for nausea. Pt has been expectorating some clear sputum in basin. Dr. Burt Knack rounded on pt during assessment and checked wounds to L leg. Since assessment, pt has had dressing changed to his L leg, penrose drain is noted to be intact, dry appearing, pt tolerated dressing change well. Dr. Darvin Neighbours has rounded on pt and pt has left the floor at this time for chest x ray.

## 2016-10-26 NOTE — Discharge Instructions (Signed)
Low salt diet  Activity as tolerated   - Daily fluids < 2 liters. - Low salt diet - Check weight everyday and keep log. Take to your doctors appt. - Take extra dose of lasix if you gain more than 3 pounds weight.   Follow up at [facility] on Feb 6 for your leg surgery follow up.

## 2016-10-26 NOTE — Discharge Summary (Signed)
[place] at [place] NAME: Darius Taylor    MR#:  [id number]  DATE OF BIRTH:  [birth year]  DATE OF ADMISSION:  10/21/2016 ADMITTING PHYSICIAN: Bettey Costa, MD  DATE OF DISCHARGE: 10/26/2016  PRIMARY CARE PHYSICIAN: [place]   ADMISSION DIAGNOSIS:  Pain [R52] HCAP (healthcare-associated pneumonia) [J18.9]  DISCHARGE DIAGNOSIS:  Active Problems:   HCAP (healthcare-associated pneumonia)   Hematoma  SECONDARY DIAGNOSIS:   Past Medical History:  Diagnosis Date  . Aortic stenosis   . Asthma   . CAD (coronary artery disease)   . Cancer [facility])    colon, bladder and kidney  . COPD (chronic obstructive pulmonary disease) ([place])   . Hypertension      ADMITTING HISTORY  HISTORY OF PRESENT ILLNESS:  Darius Taylor  is a 71 y.o. male with a known history of Diastolic dysfunction grade 2 with preserved ejection fraction by echocardiogram January 2018, V. tach with ICD, CAD with prior bare metal stent, severe aortic valve stenosis, recent admission to [facility] for left leg acute limb ischemia status post embolectomy of left CFa, SFA, profonda femoris, left common femoral artery endarterectomy and left lower leg 4 compartment fasciotomies who presents with shortness of breath and left hand pain. Chest x-ray in emergency department shows pneumonia versus pulmonary edema Patient reports that he did not fall in his left hand but started hurting yesterday. Patient did have a fever in the emergency room. Patient has received broad-spectrum antibiotics.   HOSPITAL COURSE:   71 year old male with a complicated cardiac history with most recent echocardiogram showing severe aortic stenosis Who presents with shortness of breath and left wrist pain.  * Left calf swelling Had recent surgery US showed possible abscess vs Hematoma No pus on I n D. Hematoma. Penrose drain in place Patient will follow-up with Dr. Burt Knack of surgery in 1  week for reassessment of wound and removal of the drain.  * Sepsis -  POA Influenza A Positive HCAP Treated with broad-spectrum antibiotics and Tamiflu. Finished his antibiotics and Tamiflu course. Will be on prednisone 20 mg daily for 4 more days. nebulizers as before  * Acute hypoxic respiratory failure due to HCAP and acute on chronic systolic heart failure  Due to severe AS no ACEI or NTG. Diabetes well with IV Lasix. Switch to oral Lasix at discharge. Still needing 2 L oxygen   * Severe AS by ECHO 09/2016 at [facility] He is under evaluation at [facility]  Has appt on 10/31/2016  * COPD exacerbation On prednisone and antibiotics Nebs scheduled  * Left wrist pain: x-ray of left wrist- nothing acute Orthopedics consulted. Aspiration yielded no fluid. Has a splint in place.  * History of atrial fibrillation Continue amiodarone and Eliquis Metoprolol  * CAD: Continue aspirin, atorvastatin, metoprolol  Stable for discharge back to skilled nursing facility.  CONSULTS OBTAINED:  Treatment Team:  Teodoro Spray, MD Florene Glen, MD  DRUG ALLERGIES:  No Known Allergies  DISCHARGE MEDICATIONS:   Current Discharge Medication List    START taking these medications   Details  guaiFENesin (ROBITUSSIN) 100 MG/5ML SOLN Take 5 mLs (100 mg total) by mouth every 4 (four) hours as needed for cough or to loosen phlegm. Qty: 1200 mL, Refills: 0      CONTINUE these medications which have NOT CHANGED   Details  acetaminophen (TYLENOL) 325 MG tablet Take 325 mg by mouth every 6 (six) hours as needed.  albuterol (PROVENTIL) (2.5 MG/3ML) 0.083% nebulizer solution Take 2.5 mg by nebulization every 6 (six) hours as needed for wheezing or shortness of breath.    amiodarone (PACERONE) 200 MG tablet Take 200 mg by mouth daily.    apixaban (ELIQUIS) 5 MG TABS tablet Take 5 mg by mouth 2 (two) times daily.    aspirin 81 MG chewable tablet Chew 81 mg by mouth daily.     atorvastatin (LIPITOR) 80 MG tablet Take 80 mg by mouth daily.    budesonide-formoterol (SYMBICORT) 160-4.5 MCG/ACT inhaler Inhale 2 puffs into the lungs 2 (two) times daily.    cholecalciferol (VITAMIN D) 1000 units tablet Take 1,000 Units by mouth daily.    docusate sodium (COLACE) 100 MG capsule Take 200 mg by mouth 2 (two) times daily as needed for mild constipation.    furosemide (LASIX) 40 MG tablet Take 40 mg by mouth 2 (two) times daily.    magnesium oxide (MAG-OX) 400 MG tablet Take 400 mg by mouth 2 (two) times daily.    metoprolol (TOPROL-XL) 200 MG 24 hr tablet Take 200 mg by mouth daily.    nitroGLYCERIN (NITROSTAT) 0.4 MG SL tablet Place 0.4 mg under the tongue every 5 (five) minutes as needed for chest pain.    Probiotic Product (ALIGN) 4 MG CAPS Take 1 capsule by mouth daily.      STOP taking these medications     doxycycline (VIBRA-TABS) 100 MG tablet         Today   VITAL SIGNS:  Blood pressure 140/75, pulse 65, temperature 97.9 F (36.6 C), temperature source Oral, resp. rate (!) 22, height 5\' 10"  (1.778 m), weight 97.5 kg (215 lb), SpO2 95 %.  I/O:   Intake/Output Summary (Last 24 hours) at 10/26/16 1450 Last data filed at 10/26/16 0920  Gross per 24 hour  Intake                0 ml  Output             1460 ml  Net            -1460 ml    PHYSICAL EXAMINATION:  Physical Exam  GENERAL:  71 y.o.-year-old patient lying in the bed with no acute distress.  LUNGS: normal work of breathing. Mild expiratory wheezing. CARDIOVASCULAR: S1, S2 normal. No murmurs, rubs, or gallops.  ABDOMEN: Soft, non-tender, non-distended. Bowel sounds present. No organomegaly or mass.  NEUROLOGIC: Moves all 4 extremities. PSYCHIATRIC: The patient is alert and oriented x 3.  SKIN: Dressing over right leg surgical wound  DATA REVIEW:   CBC  Recent Labs Lab 10/25/16 0354  WBC 6.0  HGB 9.0*  HCT 27.3*  PLT 203    Chemistries   Recent Labs Lab 10/21/16 0645   10/25/16 0354  NA 136  < > 136  K 4.1  < > 4.6  CL 97*  < > 97*  CO2 30  < > 34*  GLUCOSE 105*  < > 104*  BUN 20  < > 38*  CREATININE 1.25*  < > 1.22  CALCIUM 9.2  < > 9.0  MG  --   --  2.1  AST 50*  --   --   ALT 29  --   --   ALKPHOS 91  --   --   BILITOT 1.2  --   --   < > = values in this interval not displayed.  Cardiac Enzymes No results for input(s): TROPONINI in  the last 168 hours.  Microbiology Results  Results for orders placed or performed during the hospital encounter of 10/21/16  Urine culture     Status: None   Collection Time: 10/21/16  7:05 AM  Result Value Ref Range Status   Specimen Description URINE, RANDOM  Final   Special Requests NONE  Final   Culture   Final    NO GROWTH Performed at [facility], [place] [address], [place], [place] [address]    Report Status 10/22/2016 FINAL  Final  Blood Culture (routine x 2)     Status: None   Collection Time: 10/21/16  7:09 AM  Result Value Ref Range Status   Specimen Description BLOOD RIGHT HAND  Final   Special Requests   Final    BOTTLES DRAWN AEROBIC AND ANAEROBIC ANA11ML AER13ML   Culture NO GROWTH 5 DAYS  Final   Report Status 10/26/2016 FINAL  Final  Blood Culture (routine x 2)     Status: None   Collection Time: 10/21/16  7:09 AM  Result Value Ref Range Status   Specimen Description BLOOD RIGHT FOREARM  Final   Special Requests   Final    BOTTLES DRAWN AEROBIC AND ANAEROBIC AER11ML ANA13ML   Culture NO GROWTH 5 DAYS  Final   Report Status 10/26/2016 FINAL  Final  MRSA PCR Screening     Status: Abnormal   Collection Time: 10/21/16 12:15 PM  Result Value Ref Range Status   MRSA by PCR POSITIVE (A) NEGATIVE Final    Comment:        The GeneXpert MRSA Assay (FDA approved for NASAL specimens only), is one component of a comprehensive MRSA colonization surveillance program. It is not intended to diagnose MRSA infection nor to guide or monitor treatment for MRSA infections. RESULT CALLED TO,  READ BACK BY AND VERIFIED WITH: AMANDA CAMPBELL ON 10/21/16 AT 1348 [facility]   Aerobic/Anaerobic Culture (surgical/deep wound)     Status: None (Preliminary result)   Collection Time: 10/24/16  1:12 PM  Result Value Ref Range Status   Specimen Description LEG LEFT CALF HEMATOMA  Final   Special Requests NONE  Final   Gram Stain   Final    FEW WBC PRESENT,BOTH PMN AND MONONUCLEAR NO ORGANISMS SEEN Performed at [facility], [address], [place], [place] [address]    Culture   Final    FEW ENTEROCOCCUS FAECALIS NO ANAEROBES ISOLATED; CULTURE IN PROGRESS FOR 5 DAYS    Report Status PENDING  Incomplete    RADIOLOGY:  Dg Chest 2 View  Result Date: 10/26/2016 CLINICAL DATA:  Hypoxia. EXAM: CHEST  2 VIEW COMPARISON:  Radiograph of October 21, 2016. FINDINGS: Stable cardiomegaly. Atherosclerosis of thoracic aorta is noted. Left-sided pacemaker is unchanged in position. No pneumothorax or pleural effusion is noted. Bony thorax is unremarkable. IMPRESSION: No active cardiopulmonary disease. Electronically Signed   By: Marijo Conception, M.D.   On: 10/26/2016 13:41    Follow up with PCP in 1 week.  Management plans discussed with the patient, family and they are in agreement.  CODE STATUS:     Code Status Orders        Start     Ordered   10/21/16 1217  Full code  Continuous     10/21/16 1216    Code Status History    Date Active Date Inactive Code Status Order ID Comments User Context   This patient has a current code status but no  historical code status.      TOTAL TIME TAKING CARE OF THIS PATIENT ON DAY OF DISCHARGE: more than 30 minutes.   [name] R M.D on 10/26/2016 at 2:50 PM  Between 7am to 6pm - Pager - [phone]  After 6pm go to [email or web address] - password [email or web address] [facility]  Office  [phone]  CC: Primary care physician; [place]  Note: This dictation was prepared with Dragon dictation along with smaller  phrase technology. Any transcriptional errors that result from this process are unintentional.

## 2016-10-26 NOTE — Plan of Care (Signed)
Problem: Pain Management: Goal: Expressions of feelings of enhanced comfort will increase Outcome: Completed/Met Date Met: 10/26/16 Pt has met goals for discharge.

## 2016-10-26 NOTE — Progress Notes (Signed)
IV to PO switch furosemide  Patient was previously on furosemide 40 mg IV twice daily. Reason for switch: patient no longer acutely fluid overload and is taking other PO meds.  Will switch back to furosemide 40 mg twice daily PO (patient's home dose).  Tobie Lords, PharmD, BCPS Clinical Pharmacist 10/26/2016

## 2016-10-26 NOTE — Progress Notes (Signed)
This Probation officer has called [facility] and gave report on this pt. PIV removed from pt's rac with catheter intact, pt did bleed through dressing despite pressure to site afterwards, but bleeding resolved quickly. Pt is awaiting ems transport, report given to Helene Kelp, Therapist, sports.

## 2016-10-26 NOTE — Evaluation (Signed)
Physical Therapy Evaluation Patient Details Name: Darius Taylor MRN: [id number] DOB: Jun 25, 1946 Today's Date: 10/26/2016   History of Present Illness  71 y/o male who comes from rehab facility with c/o L wrist pain and PNA/flu.    Clinical Impression  Pt did well with PT exam, but does have significant pain/hesitancy/limp with ambulation and heavy reliance on the walker.  He reports L wirst pain has subsided well, but L lower leg still 5/10 pain at rest, with some increase with activity.  Overall pt did well, but fatigued with the effort of ~50 ft and needed 2 liters O2 t/o the effort with sats remaining in the low 90s t/o.  Pt will benefit from continued time at rehab in order to be strong enough to go home safely.      Follow Up Recommendations SNF    Equipment Recommendations       Recommendations for Other Services       Precautions / Restrictions Precautions Precautions: Fall (isolation) Restrictions Weight Bearing Restrictions: No      Mobility  Bed Mobility Overal bed mobility: Modified Independent             General bed mobility comments: Pt able to get himself to sitting EOB with minimal rail use - maintained balance w/o issue  Transfers Overall transfer level: Modified independent Equipment used: Rolling walker (2 wheeled)             General transfer comment: Pt showed poor mechanics and awareness with getting to standing, but ultimately was able to muscle himself to standing w/o direct assist  Ambulation/Gait Ambulation/Gait assistance: Min guard Ambulation Distance (Feet): 55 Feet Assistive device: Rolling walker (2 wheeled)       General Gait Details: Pt with signficant reliance on the walker and limp on the L LE.  He did not have any overt LOBs, but generally was weak and limited.  Pt did have improved WBing tolerance and cadence with increased ambulation but remained stiff t/o the effort.   Stairs            Wheelchair Mobility     Modified Rankin (Stroke Patients Only)       Balance Overall balance assessment: Modified Independent (needs walker/UEs in standing)                                           Pertinent Vitals/Pain Pain Assessment: 0-10 Pain Score: 5  Pain Location: L calf/lower leg    Home Living Family/patient expects to be discharged to:: Skilled nursing facility Living Arrangements: Spouse/significant other               Additional Comments: daughter lives next door and will be able to assist when he eventually goes home    Prior Function Level of Independence: Independent         Comments: apparently he does not need O2 or RW at baseline     Hand Dominance        Extremity/Trunk Assessment   Upper Extremity Assessment Upper Extremity Assessment: Overall WFL for tasks assessed    Lower Extremity Assessment Lower Extremity Assessment: LLE deficits/detail LLE Deficits / Details: L LE grossly 4-/5, pain sensitive to resisted acts below the knee       Communication   Communication: No difficulties  Cognition Arousal/Alertness: Awake/alert Behavior During Therapy: WFL for tasks assessed/performed Overall Cognitive Status: Within  Functional Limits for tasks assessed                      General Comments      Exercises     Assessment/Plan    PT Assessment Patient needs continued PT services  PT Problem List Decreased strength;Decreased activity tolerance;Decreased balance;Decreased mobility;Decreased knowledge of use of DME;Decreased safety awareness;Decreased knowledge of precautions;Cardiopulmonary status limiting activity          PT Treatment Interventions DME instruction;Gait training;Functional mobility training;Therapeutic activities;Therapeutic exercise;Balance training;Patient/family education;Neuromuscular re-education    PT Goals (Current goals can be found in the Care Plan section)  Acute Rehab PT Goals Patient Stated  Goal: get stronger at rehab to be able to go home PT Goal Formulation: With patient Time For Goal Achievement: 11/09/16 Potential to Achieve Goals: Good    Frequency Min 2X/week   Barriers to discharge        Co-evaluation               End of Session Equipment Utilized During Treatment: Gait belt;Oxygen (2 liters) Activity Tolerance: Patient limited by fatigue Patient left: with bed alarm set;with call bell/phone within reach Nurse Communication: Mobility status         Time: 7125-2712 PT Time Calculation (min) (ACUTE ONLY): 19 min   Charges:   PT Evaluation $PT Eval Low Complexity: 1 Procedure     PT G CodesKreg Shropshire, DPT 10/26/2016, 4:09 PM

## 2016-10-26 NOTE — Progress Notes (Signed)
2 Days Post-Op  Subjective: Coughing and receiving a breathing treatment at this time. He has minimal complaints of pain on his left calf. But much improved.  Objective: Vital signs in last 24 hours: Temp:  [98 F (36.7 C)-98.6 F (37 C)] 98 F (36.7 C) (02/02 0428) Pulse Rate:  [63-66] 63 (02/02 0428) Resp:  [18-26] 22 (02/02 0428) BP: (109-148)/(42-77) 115/70 (02/02 0428) SpO2:  [92 %-97 %] 93 % (02/02 0754) Last BM Date: 10/24/16  Intake/Output from previous day: 02/01 0701 - 02/02 0700 In: -  Out: 1585 [Urine:1585] Intake/Output this shift: No intake/output data recorded.  Physical exam:  Wounds are clean no erythema no purulence. It is warm  Lab Results: CBC   Recent Labs  10/25/16 0354  WBC 6.0  HGB 9.0*  HCT 27.3*  PLT 203   BMET  Recent Labs  10/25/16 0354  NA 136  K 4.6  CL 97*  CO2 34*  GLUCOSE 104*  BUN 38*  CREATININE 1.22  CALCIUM 9.0   PT/INR No results for input(s): LABPROT, INR in the last 72 hours. ABG No results for input(s): PHART, HCO3 in the last 72 hours.  Invalid input(s): PCO2, PO2  Studies/Results: No results found.  Anti-infectives: Anti-infectives    Start     Dose/Rate Route Frequency Ordered Stop   10/25/16 2200  oseltamivir (TAMIFLU) capsule 75 mg     75 mg Oral 2 times daily 10/25/16 1006 10/27/16 0959   10/23/16 1000  oseltamivir (TAMIFLU) capsule 30 mg  Status:  Discontinued     30 mg Oral 2 times daily 10/23/16 0954 10/25/16 1006   10/22/16 1000  oseltamivir (TAMIFLU) capsule 75 mg  Status:  Discontinued     75 mg Oral 2 times daily 10/22/16 0820 10/23/16 0954   10/21/16 2000  ceFEPIme (MAXIPIME) 2 GM / 57mL IVPB premix  Status:  Discontinued     2 g 100 mL/hr over 30 Minutes Intravenous Every 12 hours 10/21/16 1034 10/21/16 1205   10/21/16 2000  ceFEPIme (MAXIPIME) 2 g in dextrose 5 % 50 mL IVPB  Status:  Discontinued     2 g 100 mL/hr over 30 Minutes Intravenous Every 12 hours 10/21/16 1205 10/25/16 1428    10/21/16 1400  vancomycin (VANCOCIN) IVPB 1000 mg/200 mL premix  Status:  Discontinued     1,000 mg 200 mL/hr over 60 Minutes Intravenous Every 12 hours 10/21/16 1034 10/25/16 1408   10/21/16 0830  ceFEPIme (MAXIPIME) 2 g in dextrose 5 % 50 mL IVPB     2 g 100 mL/hr over 30 Minutes Intravenous  Once 10/21/16 0823 10/21/16 0840   10/21/16 0815  ceFEPIme (MAXIPIME) 2 GM / 45mL IVPB premix  Status:  Discontinued     2 g 100 mL/hr over 30 Minutes Intravenous  Once 10/21/16 0808 10/21/16 0823   10/21/16 0815  vancomycin (VANCOCIN) IVPB 1000 mg/200 mL premix     1,000 mg 200 mL/hr over 60 Minutes Intravenous  Once 10/21/16 0808 10/21/16 1011      Assessment/Plan: s/p Procedure(s): IRRIGATION AND DEBRIDEMENT HEMATOMA   Patient doing quite well cultures pending at this time. No sign of infection at this time. Continue Penrose drain. From a surgical standpoint patient could be discharged any time although it seems his pulmonary status may hold him back. He can BE seen in the office next week for Penrose drain removal.  Florene Glen, MD, FACS  10/26/2016

## 2016-10-26 NOTE — Progress Notes (Signed)
Patient is medically stable for D/C back to [facility]. Per [facility] admissions coordinator at [facility] patient is not a long term care resident he is there for short term rehab and requires [facility] authorization. PT worked with patient today. Clinical Education officer, museum ([place]) started [employer] authorization for SNF through [employer]. CSW faxed clinicals to [facility] today. Per Marden Noble he will accept a 5 day LOG. CSW Mudlogger approved 5 day LOG.  Patient will return to [facility] today to room 42-A. RN will call report and arrange EMS for transport. CSW sent D/C orders to [facility] via [place]. CSW explained to patient that if [facility] denies SNF stay then he will have to D/C home from [facility] after 5 days. Patient verbalized his understanding. CSW contacted patient's daughter Arizona Constable and made her aware of above. Please reconsult if future social work needs arise. CSW signing off.

## 2016-10-26 NOTE — Progress Notes (Signed)
Pt transferred to [facility] via EMS. Pt was transported with 2 liters 02. VSS upon discharge. Personal belongings sent with patient.

## 2016-10-26 NOTE — NC FL2 (Signed)
[place] LEVEL OF CARE SCREENING TOOL     IDENTIFICATION  Patient Name: Darius Taylor Birthdate: 10/28/1945 Sex: male Admission Date (Current Location): 10/21/2016  [facility] and [place] Number:  Engineering geologist and Address:  [facility], [address], [place], [place] [address]      Provider Number: [id number]  Attending Physician Name and Address:  [name], MD  Relative Name and Phone Number:       Current Level of Care: Hospital Recommended Level of Care: [place] Prior Approval Number:    Date Approved/Denied:   PASRR Number:    Discharge Plan: SNF    Current Diagnoses: Patient Active Problem List   Diagnosis Date Noted  . Hematoma   . HCAP (healthcare-associated pneumonia) 10/21/2016    Orientation RESPIRATION BLADDER Height & Weight     Self, Time, Situation, Place  O2 (4 litres) Continent Weight: 215 lb (97.5 kg) Height:  5\' 10"  (177.8 cm)  BEHAVIORAL SYMPTOMS/MOOD NEUROLOGICAL BOWEL NUTRITION STATUS      Continent Diet (Normal)  AMBULATORY STATUS COMMUNICATION OF NEEDS Skin   Supervision Verbally Other (Comment) (Blood clot left lower leg)                       Personal Care Assistance Level of Assistance  Bathing, Feeding, Dressing, Total care Bathing Assistance: Limited assistance Feeding assistance: Independent Dressing Assistance: Limited assistance Total Care Assistance: Limited assistance   Functional Limitations Info  Sight, Hearing, Speech Sight Info: Adequate Hearing Info: Adequate Speech Info: Adequate    SPECIAL CARE FACTORS FREQUENCY                       Contractures Contractures Info: Not present    Additional Factors Info  Isolation Precautions         Isolation Precautions Info: Droplet precaution     Current Medications (10/26/2016):  This is the current hospital active medication list Current Facility-Administered Medications  Medication  Dose Route Frequency Provider Last Rate Last Dose  . acetaminophen (TYLENOL) tablet 650 mg  650 mg Oral Q4H PRN Bettey Costa, MD   650 mg at 10/21/16 1429  . acidophilus (RISAQUAD) capsule 1 capsule  1 capsule Oral Daily Bettey Costa, MD   1 capsule at 10/26/16 0917  . albuterol (PROVENTIL) (2.5 MG/3ML) 0.083% nebulizer solution 2.5 mg  2.5 mg Nebulization Q6H Srikar Sudini, MD   2.5 mg at 10/26/16 1426  . albuterol (PROVENTIL) (2.5 MG/3ML) 0.083% nebulizer solution 2.5 mg  2.5 mg Nebulization Q4H PRN Srikar Sudini, MD      . amiodarone (PACERONE) tablet 200 mg  200 mg Oral Daily Bettey Costa, MD   200 mg at 10/26/16 0916  . apixaban (ELIQUIS) tablet 5 mg  5 mg Oral BID [name], MD   5 mg at 10/26/16 0916  . aspirin chewable tablet 81 mg  81 mg Oral Daily Bettey Costa, MD   81 mg at 10/26/16 0916  . atorvastatin (LIPITOR) tablet 80 mg  80 mg Oral q1800 Bettey Costa, MD   80 mg at 10/25/16 1813  . bisacodyl (DULCOLAX) EC tablet 5 mg  5 mg Oral Daily PRN Bettey Costa, MD      . Chlorhexidine Gluconate Cloth 2 % PADS 6 each  6 each Topical Q0600 Bettey Costa, MD   6 each at 10/26/16 0530  . cholecalciferol (VITAMIN D) tablet 1,000 Units  1,000 Units Oral Daily Sital  Benjie Karvonen, MD   1,000 Units at 10/26/16 [phone]  . docusate sodium (COLACE) capsule 200 mg  200 mg Oral BID PRN Bettey Costa, MD      . furosemide (LASIX) injection 40 mg  40 mg Intravenous BID [name], MD   40 mg at 10/26/16 0804  . guaiFENesin (ROBITUSSIN) 100 MG/5ML solution 100 mg  5 mL Oral Q4H PRN Bettey Costa, MD   100 mg at 10/25/16 2107  . HYDROcodone-acetaminophen (NORCO/VICODIN) 5-325 MG per tablet 1-2 tablet  1-2 tablet Oral Q4H PRN Bettey Costa, MD      . magnesium oxide (MAG-OX) tablet 400 mg  400 mg Oral BID Bettey Costa, MD   400 mg at 10/26/16 0916  . MEDLINE mouth rinse  15 mL Mouth Rinse BID Bettey Costa, MD   15 mL at 10/25/16 2035  . metoprolol succinate (TOPROL-XL) 24 hr tablet 25 mg  25 mg Oral Daily [name], MD   25 mg at  10/26/16 0916  . mometasone-formoterol (DULERA) 200-5 MCG/ACT inhaler 2 puff  2 puff Inhalation BID Bettey Costa, MD   2 puff at 10/25/16 2034  . nitroGLYCERIN (NITROSTAT) SL tablet 0.4 mg  0.4 mg Sublingual Q5 min PRN Bettey Costa, MD      . ondansetron (ZOFRAN) tablet 4 mg  4 mg Oral Q6H PRN Bettey Costa, MD   4 mg at 10/22/16 0224   Or  . ondansetron (ZOFRAN) injection 4 mg  4 mg Intravenous Q6H PRN Bettey Costa, MD   4 mg at 10/26/16 0749  . oseltamivir (TAMIFLU) capsule 75 mg  75 mg Oral BID [name], MD   75 mg at 10/26/16 0915  . predniSONE (DELTASONE) tablet 50 mg  50 mg Oral Q breakfast [name], MD   50 mg at 10/26/16 0806  . senna-docusate (Senokot-S) tablet 1 tablet  1 tablet Oral QHS PRN Bettey Costa, MD      . sodium phosphate (FLEET) 7-19 GM/118ML enema 1 enema  1 enema Rectal Once PRN Bettey Costa, MD         Discharge Medications: Please see discharge summary for a list of discharge medications.  Relevant Imaging Results:  Relevant Lab Results:   Additional Information SSN [id number]  Bentlie Withem, Veronia Beets, [place]

## 2016-10-26 NOTE — Progress Notes (Signed)
Pt has had PT eval completed.

## 2016-10-29 LAB — AEROBIC/ANAEROBIC CULTURE (SURGICAL/DEEP WOUND)

## 2016-10-29 LAB — AEROBIC/ANAEROBIC CULTURE W GRAM STAIN (SURGICAL/DEEP WOUND)

## 2016-10-29 NOTE — Progress Notes (Signed)
Clinical Education officer, museum (CSW) received a call from [place] today 10/29/16 with [facility] SNF authorization. Auth # [id number], [facility], recommended 554 therapy hours per week, start date 10/26/16. Doug admissions coordinator at [facility] is aware of above.   [employer], LCSW [phone]

## 2017-03-27 DIAGNOSIS — Z905 Acquired absence of kidney: Secondary | ICD-10-CM

## 2017-03-27 DIAGNOSIS — Z9049 Acquired absence of other specified parts of digestive tract: Secondary | ICD-10-CM | POA: Insufficient documentation

## 2017-06-10 ENCOUNTER — Emergency Department
Admission: EM | Admit: 2017-06-10 | Discharge: 2017-06-10 | Disposition: A | Discharge status: Home / Self Care | Payer: Medicare HMO | Attending: Emergency Medicine | Admitting: Emergency Medicine

## 2017-06-10 ENCOUNTER — Encounter: Payer: Self-pay | Admitting: Emergency Medicine

## 2017-06-10 ENCOUNTER — Emergency Department: Payer: Medicare HMO

## 2017-06-10 DIAGNOSIS — M7722 Periarthritis, left wrist: Secondary | ICD-10-CM | POA: Diagnosis not present

## 2017-06-10 DIAGNOSIS — J45909 Unspecified asthma, uncomplicated: Secondary | ICD-10-CM | POA: Diagnosis not present

## 2017-06-10 DIAGNOSIS — I10 Essential (primary) hypertension: Secondary | ICD-10-CM | POA: Diagnosis not present

## 2017-06-10 DIAGNOSIS — Z7982 Long term (current) use of aspirin: Secondary | ICD-10-CM | POA: Diagnosis not present

## 2017-06-10 DIAGNOSIS — J449 Chronic obstructive pulmonary disease, unspecified: Secondary | ICD-10-CM | POA: Insufficient documentation

## 2017-06-10 DIAGNOSIS — Z95810 Presence of automatic (implantable) cardiac defibrillator: Secondary | ICD-10-CM | POA: Insufficient documentation

## 2017-06-10 DIAGNOSIS — M25841 Other specified joint disorders, right hand: Secondary | ICD-10-CM

## 2017-06-10 DIAGNOSIS — Z87891 Personal history of nicotine dependence: Secondary | ICD-10-CM | POA: Diagnosis not present

## 2017-06-10 DIAGNOSIS — Z8739 Personal history of other diseases of the musculoskeletal system and connective tissue: Secondary | ICD-10-CM | POA: Diagnosis not present

## 2017-06-10 DIAGNOSIS — Z79899 Other long term (current) drug therapy: Secondary | ICD-10-CM | POA: Diagnosis not present

## 2017-06-10 DIAGNOSIS — I2510 Atherosclerotic heart disease of native coronary artery without angina pectoris: Secondary | ICD-10-CM | POA: Insufficient documentation

## 2017-06-10 DIAGNOSIS — M79642 Pain in left hand: Secondary | ICD-10-CM | POA: Diagnosis present

## 2017-06-10 MED ORDER — PREDNISONE 20 MG PO TABS
60.0000 mg | ORAL_TABLET | Freq: Once | ORAL | Status: AC
  Administered 2017-06-10: 60 mg via ORAL
  Filled 2017-06-10: qty 3

## 2017-06-10 MED ORDER — PREDNISONE 10 MG (21) PO TBPK: ORAL_TABLET | ORAL | 0 refills | Status: DC

## 2017-06-10 NOTE — ED Triage Notes (Signed)
Pt to ed with c/o left hand pain that started 2 days ago.  Reports redness and pain worse with movement.

## 2017-06-10 NOTE — ED Notes (Signed)
See triage note  Presents with pain to both hand and swelling noted at joints  Pain and swelling is worse to left hand  Hx of gout in past

## 2017-06-10 NOTE — ED Provider Notes (Signed)
[facility] Emergency Department Provider Note  ____________________________________________  Time seen: Approximately 9:27 AM  I have reviewed the triage vital signs and the nursing notes.   HISTORY  Chief Complaint Hand Pain    HPI Darius Taylor is a 71 y.o. male that presents to emergency department with left hand pain for 2 days. Patient states that it is painful around his wrist and feels tingling and throbbing up into his hand. Pain is worse at night. He has a history of gout. He also has a lump on his right middle finger he would like looked at. It has been there for several months. It is nontender. He has an appointment with his primary care provider tomorrow. No trauma. No fever, CP, SOB.    Past Medical History:  Diagnosis Date  . Aortic stenosis   . Asthma   . CAD (coronary artery disease)   . Cancer [facility])    colon, bladder and kidney  . COPD (chronic obstructive pulmonary disease) ([place])   . Hypertension     Patient Active Problem List   Diagnosis Date Noted  . Hematoma   . HCAP (healthcare-associated pneumonia) 10/21/2016    Past Surgical History:  Procedure Laterality Date  . CARDIAC DEFIBRILLATOR PLACEMENT    . CORONARY ANGIOPLASTY  09/25/2016  . IRRIGATION AND DEBRIDEMENT HEMATOMA Left 10/24/2016   Procedure: IRRIGATION AND DEBRIDEMENT HEMATOMA;  Surgeon: Florene Glen, MD;  Location: [facility] ORS;  Service: General;  Laterality: Left;  . KIDNEY SURGERY     left removed    Prior to Admission medications   Medication Sig Start Date End Date Taking? Authorizing Provider  acetaminophen (TYLENOL) 325 MG tablet Take 325 mg by mouth every 6 (six) hours as needed.    [provider]  albuterol (PROVENTIL) (2.5 MG/3ML) 0.083% nebulizer solution Take 2.5 mg by nebulization every 6 (six) hours as needed for wheezing or shortness of breath.    [provider]  amiodarone (PACERONE) 200 MG tablet Take 200 mg by mouth daily.     [provider]  apixaban (ELIQUIS) 5 MG TABS tablet Take 5 mg by mouth 2 (two) times daily.    [provider]  aspirin 81 MG chewable tablet Chew 81 mg by mouth daily.    [provider]  atorvastatin (LIPITOR) 80 MG tablet Take 80 mg by mouth daily.    [provider]  budesonide-formoterol (SYMBICORT) 160-4.5 MCG/ACT inhaler Inhale 2 puffs into the lungs 2 (two) times daily.    [provider]  cholecalciferol (VITAMIN D) 1000 units tablet Take 1,000 Units by mouth daily.    [provider]  docusate sodium (COLACE) 100 MG capsule Take 200 mg by mouth 2 (two) times daily as needed for mild constipation.    [provider]  furosemide (LASIX) 40 MG tablet Take 40 mg by mouth 2 (two) times daily.    [provider]  guaiFENesin (ROBITUSSIN) 100 MG/5ML SOLN Take 5 mLs (100 mg total) by mouth every 4 (four) hours as needed for cough or to loosen phlegm. 10/26/16   [name], MD  magnesium oxide (MAG-OX) 400 MG tablet Take 400 mg by mouth 2 (two) times daily.    [provider]  metoprolol (TOPROL-XL) 200 MG 24 hr tablet Take 200 mg by mouth daily.    [provider]  nitroGLYCERIN (NITROSTAT) 0.4 MG SL tablet Place 0.4 mg under the tongue every 5 (five) minutes as needed for chest pain.  [provider]  predniSONE (STERAPRED UNI-PAK 21 TAB) 10 MG (21) TBPK tablet Take 6 tablets on day 1, take 5 tablets on day 2, take 4 tablets on day 3, take 3 tablets on day 4, take 2 tablets on day 5, take 1 tablet on day 6 06/10/17   [name], PA-C  Probiotic Product (ALIGN) 4 MG CAPS Take 1 capsule by mouth daily.    [provider]    Allergies Patient has no known allergies.  History reviewed. No pertinent family history.  Social History Social History  Substance Use Topics  . Smoking status: Former Research scientist (life sciences)  . Smokeless tobacco: Never Used  . Alcohol use No     Review of Systems   Constitutional: No fever/chills Cardiovascular: No chest pain. Respiratory:  No SOB. Gastrointestinal: No abdominal pain.  No nausea, no vomiting.  Musculoskeletal: Positive for wrist and hand pain. Skin: Negative for rash, abrasions, lacerations, ecchymosis. Neurological: Negative for headaches   ____________________________________________   PHYSICAL EXAM:  VITAL SIGNS: ED Triage Vitals [06/10/17 0913]  Enc Vitals Group     BP (!) 142/82     Pulse Rate 66     Resp 18     Temp 98 F (36.7 C)     Temp Source Oral     SpO2 100 %     Weight 215 lb (97.5 kg)     Height      Head Circumference      Peak Flow      Pain Score 8     Pain Loc      Pain Edu?      Excl. in [place]?      Constitutional: Alert and oriented. Well appearing and in no acute distress. Eyes: Conjunctivae are normal. PERRL. EOMI. Head: Atraumatic. ENT:      Ears:      Nose: No congestion/rhinnorhea.      Mouth/Throat: Mucous membranes are moist.  Neck: No stridor.  Cardiovascular: Normal rate, regular rhythm.  Good peripheral circulation. Respiratory: Normal respiratory effort without tachypnea or retractions. Lungs CTAB. Good air entry to the bases with no decreased or absent breath sounds. Musculoskeletal: Full range of motion to all extremities. No gross deformities appreciated. Limited range of motion of left wrist due to pain. Positive Tinel's. No erythema or visible swelling. One 1/4 cm x 1/4 cm nontender mobile mass over joint on right index finger. Neurologic:  Normal speech and language. No gross focal neurologic deficits are appreciated.  Skin:  Skin is warm, dry and intact. No rash noted.   ____________________________________________   LABS (all labs ordered are listed, but only abnormal results are displayed)  Labs Reviewed - No data to display ____________________________________________  EKG   ____________________________________________  RADIOLOGY Robinette Haines,  personally viewed and evaluated these images (plain radiographs) as part of my medical decision making, as well as reviewing the written report by the radiologist.  Dg Hand Complete Left  Result Date: 06/10/2017 CLINICAL DATA:  Left hand pain for 2 days with redness common no known injury, initial encounter EXAM: LEFT HAND - COMPLETE 3+ VIEW COMPARISON:  10/21/2016 FINDINGS: No acute fracture or dislocation is noted. Diffuse vascular calcifications are seen. No acute soft tissue abnormality is identified. IMPRESSION: No acute abnormality noted. Electronically Signed   By: Inez Catalina M.D.   On: 06/10/2017 09:44    ____________________________________________    PROCEDURES  Procedure(s) performed:    Procedures    Medications  predniSONE (DELTASONE) tablet 60  mg (60 mg Oral Given 06/10/17 1043)     ____________________________________________   INITIAL IMPRESSION / ASSESSMENT AND PLAN / ED COURSE  Pertinent labs & imaging results that were available during my care of the patient were reviewed by me and considered in my medical decision making (see chart for details).  Review of the [place] CSRS was performed in accordance of the [place] prior to dispensing any controlled drugs.   She presented to the emergency department for evaluation of left hand pain. Symptoms are consistent with carpal tunnel. Wrist is not red or hot to touch. Vital signs and exam are reassuring. Patient is afebrile. He also has a cyst on his right middle finger over the joint. No signs of infection. He has an appointment with his primary care provider tomorrow. Patient will be discharged home with prescriptions for prednisone. Patient is to follow up with PCP as directed. Patient is given ED precautions to return to the ED for any worsening or new symptoms.     ____________________________________________  FINAL CLINICAL IMPRESSION(S) / ED DIAGNOSES  Final diagnoses:  History of gout  Inflammation around  wrist, left  Cyst of joint of hand, right      NEW MEDICATIONS STARTED DURING THIS VISIT:  Discharge Medication List as of 06/10/2017 10:25 AM    START taking these medications   Details  predniSONE (STERAPRED UNI-PAK 21 TAB) 10 MG (21) TBPK tablet Take 6 tablets on day 1, take 5 tablets on day 2, take 4 tablets on day 3, take 3 tablets on day 4, take 2 tablets on day 5, take 1 tablet on day 6, Print            This chart was dictated using voice recognition software/Dragon. Despite best efforts to proofread, errors can occur which can change the meaning. Any change was purely unintentional.    [name], PA-C 06/10/17 1411    Earleen Newport, MD 06/10/17 [phone]

## 2017-09-02 IMAGING — DX DG WRIST COMPLETE 3+V*L*
4 series · 4 of 4 positions shown · non-contrast
Comparison: None.

CLINICAL DATA: Pain in left wrist.

EXAM:
LEFT WRIST - COMPLETE 3+ VIEW

[wrist ap (1 of 2)]
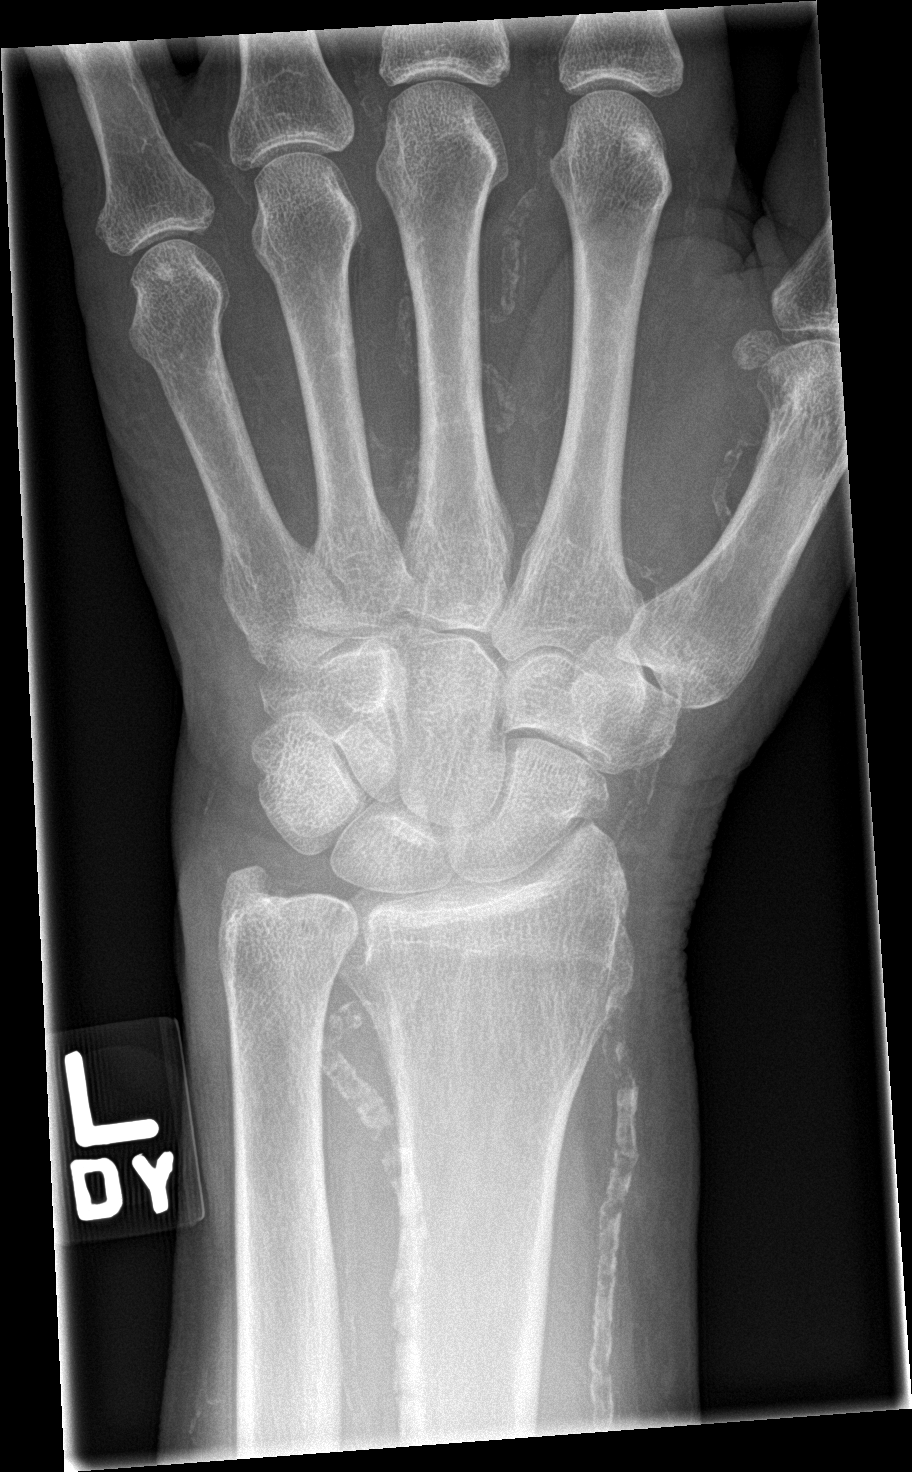

[wrist obl]
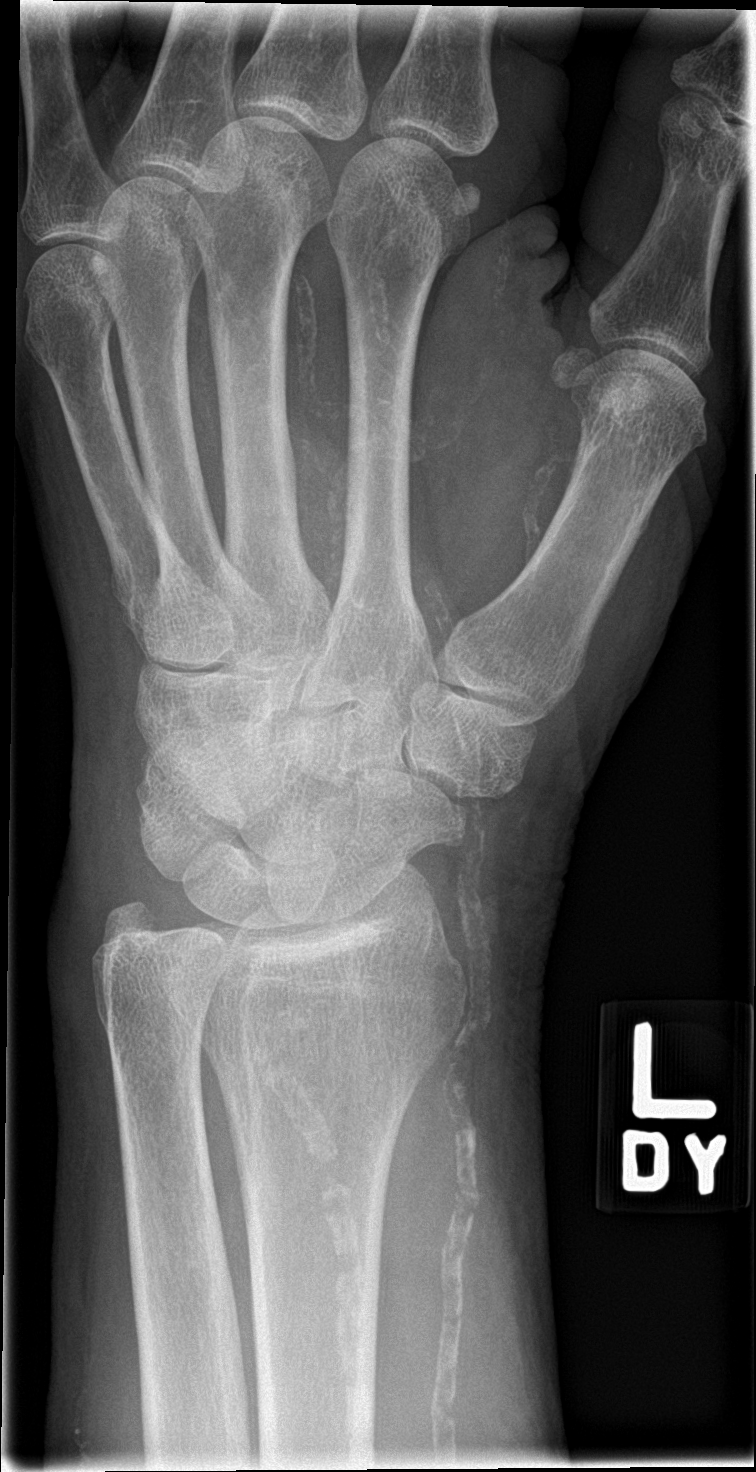

[wrist lat]
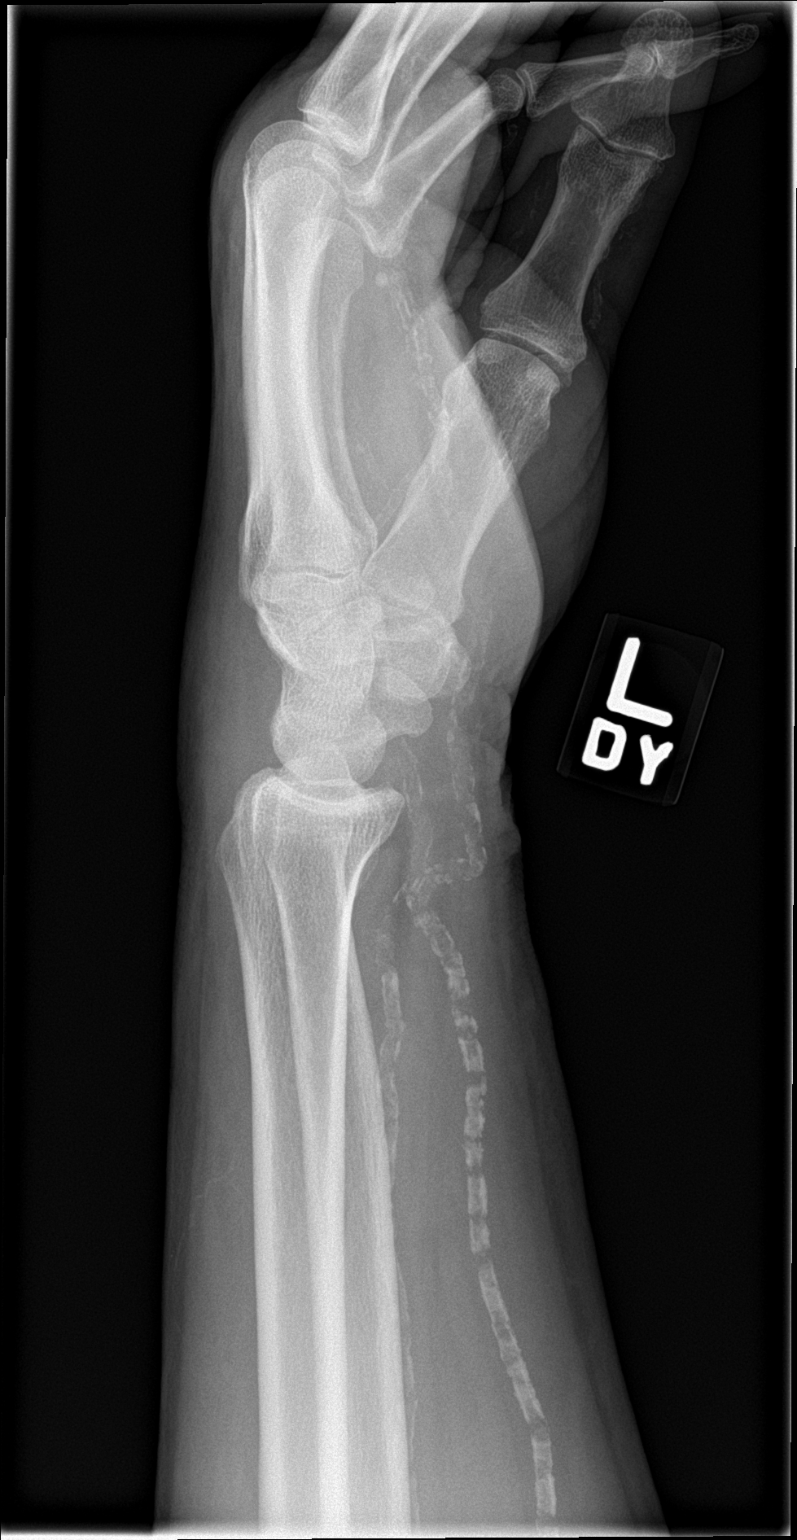

[wrist ap (2 of 2)]
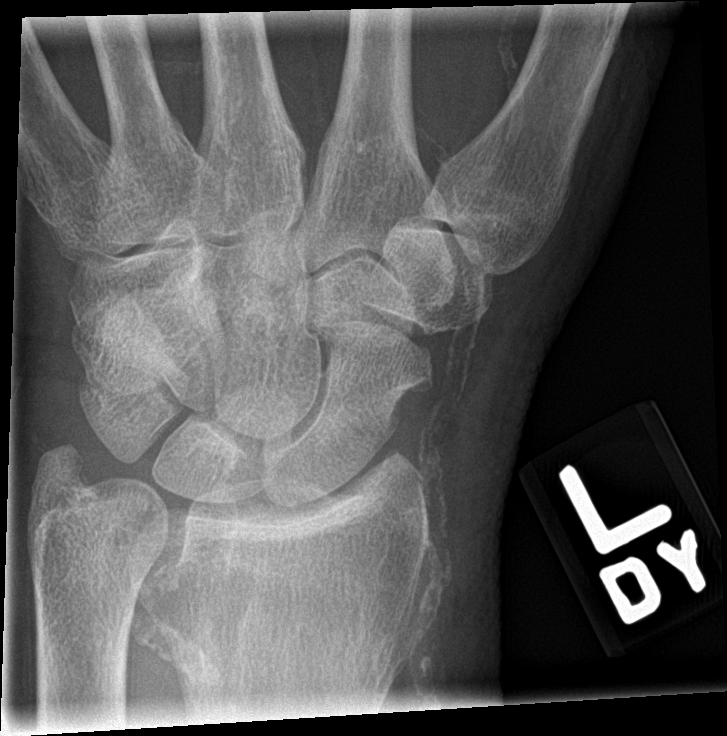

[4 of 4 positions shown; findings below may reference images not displayed]

FINDINGS: Diffuse vascular calcifications. No acute bony abnormality.
Specifically, no fracture, subluxation, or dislocation. Soft tissues
are intact. Joint spaces are maintained
IMPRESSION: Diffuse vascular calcifications.  No acute bony abnormality.

## 2017-10-30 ENCOUNTER — Emergency Department: Payer: Medicare HMO

## 2017-10-30 ENCOUNTER — Encounter: Payer: Self-pay | Admitting: Emergency Medicine

## 2017-10-30 ENCOUNTER — Other Ambulatory Visit: Payer: Self-pay

## 2017-10-30 ENCOUNTER — Emergency Department
Admission: EM | Admit: 2017-10-30 | Discharge: 2017-10-30 | Disposition: A | Discharge status: Home / Self Care | Payer: Medicare HMO | Attending: Student in an Organized Health Care Education/Training Program | Admitting: Student in an Organized Health Care Education/Training Program

## 2017-10-30 DIAGNOSIS — I10 Essential (primary) hypertension: Secondary | ICD-10-CM | POA: Insufficient documentation

## 2017-10-30 DIAGNOSIS — Z7982 Long term (current) use of aspirin: Secondary | ICD-10-CM | POA: Diagnosis not present

## 2017-10-30 DIAGNOSIS — H538 Other visual disturbances: Secondary | ICD-10-CM | POA: Diagnosis present

## 2017-10-30 DIAGNOSIS — Z79899 Other long term (current) drug therapy: Secondary | ICD-10-CM | POA: Insufficient documentation

## 2017-10-30 DIAGNOSIS — I2510 Atherosclerotic heart disease of native coronary artery without angina pectoris: Secondary | ICD-10-CM | POA: Insufficient documentation

## 2017-10-30 DIAGNOSIS — J449 Chronic obstructive pulmonary disease, unspecified: Secondary | ICD-10-CM | POA: Insufficient documentation

## 2017-10-30 DIAGNOSIS — Z8551 Personal history of malignant neoplasm of bladder: Secondary | ICD-10-CM | POA: Diagnosis not present

## 2017-10-30 DIAGNOSIS — Z7901 Long term (current) use of anticoagulants: Secondary | ICD-10-CM | POA: Diagnosis not present

## 2017-10-30 DIAGNOSIS — Z905 Acquired absence of kidney: Secondary | ICD-10-CM | POA: Diagnosis not present

## 2017-10-30 DIAGNOSIS — Z87891 Personal history of nicotine dependence: Secondary | ICD-10-CM | POA: Insufficient documentation

## 2017-10-30 DIAGNOSIS — Z85528 Personal history of other malignant neoplasm of kidney: Secondary | ICD-10-CM | POA: Insufficient documentation

## 2017-10-30 DIAGNOSIS — H539 Unspecified visual disturbance: Secondary | ICD-10-CM

## 2017-10-30 DIAGNOSIS — J45909 Unspecified asthma, uncomplicated: Secondary | ICD-10-CM | POA: Diagnosis not present

## 2017-10-30 NOTE — ED Provider Notes (Signed)
[facility] Emergency Department Provider Note    First MD Initiated Contact with Patient 10/30/17 1351     (approximate)  I have reviewed the triage vital signs and the nursing notes.   HISTORY  Chief Complaint Eye Problem    HPI Darius Taylor is a 72 y.o. male with a history of CAD COPD as well as A. fib on Eliquis presents for evaluation of sudden onset of visual disturbance in his left eye particular involving the peripheral vision area.  States he is seeing flashing lights and spots in the periphery of his vision.  Denies any pain.  Denies any numbness or tingling.  No headache.  No recent fevers.  Is been compliant with his medications.  Past Medical History:  Diagnosis Date  . Aortic stenosis   . Asthma   . CAD (coronary artery disease)   . Cancer [facility])    colon, bladder and kidney  . COPD (chronic obstructive pulmonary disease) ([place])   . Hypertension    History reviewed. No pertinent family history. Past Surgical History:  Procedure Laterality Date  . CARDIAC DEFIBRILLATOR PLACEMENT    . CORONARY ANGIOPLASTY  09/25/2016  . IRRIGATION AND DEBRIDEMENT HEMATOMA Left 10/24/2016   Procedure: IRRIGATION AND DEBRIDEMENT HEMATOMA;  Surgeon: Florene Glen, MD;  Location: [facility] ORS;  Service: General;  Laterality: Left;  . KIDNEY SURGERY     left removed   Patient Active Problem List   Diagnosis Date Noted  . Hematoma   . HCAP (healthcare-associated pneumonia) 10/21/2016      Prior to Admission medications   Medication Sig Start Date End Date Taking? Authorizing Provider  acetaminophen (TYLENOL) 325 MG tablet Take 325 mg by mouth every 6 (six) hours as needed.    [provider]  albuterol (PROVENTIL) (2.5 MG/3ML) 0.083% nebulizer solution Take 2.5 mg by nebulization every 6 (six) hours as needed for wheezing or shortness of breath.    [provider]  amiodarone (PACERONE) 200 MG tablet Take 200 mg by mouth daily.     [provider]  apixaban (ELIQUIS) 5 MG TABS tablet Take 5 mg by mouth 2 (two) times daily.    [provider]  aspirin 81 MG chewable tablet Chew 81 mg by mouth daily.    [provider]  atorvastatin (LIPITOR) 80 MG tablet Take 80 mg by mouth daily.    [provider]  budesonide-formoterol (SYMBICORT) 160-4.5 MCG/ACT inhaler Inhale 2 puffs into the lungs 2 (two) times daily.    [provider]  cholecalciferol (VITAMIN D) 1000 units tablet Take 1,000 Units by mouth daily.    [provider]  docusate sodium (COLACE) 100 MG capsule Take 200 mg by mouth 2 (two) times daily as needed for mild constipation.    [provider]  furosemide (LASIX) 40 MG tablet Take 40 mg by mouth 2 (two) times daily.    [provider]  guaiFENesin (ROBITUSSIN) 100 MG/5ML SOLN Take 5 mLs (100 mg total) by mouth every 4 (four) hours as needed for cough or to loosen phlegm. 10/26/16   [name], MD  magnesium oxide (MAG-OX) 400 MG tablet Take 400 mg by mouth 2 (two) times daily.    [provider]  metoprolol (TOPROL-XL) 200 MG 24 hr tablet Take 200 mg by mouth daily.    [provider]  nitroGLYCERIN (NITROSTAT) 0.4 MG SL tablet Place 0.4 mg under the tongue every 5 (five) minutes as needed for chest  pain.    [provider]  predniSONE (STERAPRED UNI-PAK 21 TAB) 10 MG (21) TBPK tablet Take 6 tablets on day 1, take 5 tablets on day 2, take 4 tablets on day 3, take 3 tablets on day 4, take 2 tablets on day 5, take 1 tablet on day 6 06/10/17   [name], PA-C  Probiotic Product (ALIGN) 4 MG CAPS Take 1 capsule by mouth daily.    [provider]    Allergies Patient has no known allergies.    Social History Social History   Tobacco Use  . Smoking status: Former Research scientist (life sciences)  . Smokeless tobacco: Never Used  Substance Use Topics  . Alcohol use: No  . Drug use: No    Review of Systems Patient denies  headaches, rhinorrhea, blurry vision, numbness, shortness of breath, chest pain, edema, cough, abdominal pain, nausea, vomiting, diarrhea, dysuria, fevers, rashes or hallucinations unless otherwise stated above in HPI. ____________________________________________   PHYSICAL EXAM:  VITAL SIGNS: Vitals:   10/30/17 1331 10/30/17 1333  BP:  (!) 157/82  Pulse: 62   Resp: 18   Temp: 98.1 F (36.7 C)     Constitutional: Alert and oriented. Well appearing and in no acute distress. Eyes: Conjunctivae are normal.  Her ultrasound shows no evidence of retinal detachment or hemorrhage.  Funduscopic exam limited but shows no evidence of artery occlusion or hemorrhage.  No hyphema no cells or flare.  R 20/50 L 20/200 Head: Atraumatic. Nose: No congestion/rhinnorhea. Mouth/Throat: Mucous membranes are moist.   Neck: No stridor. Painless ROM.  Cardiovascular: Normal rate, regular rhythm. Grossly normal heart sounds.  Good peripheral circulation. Respiratory: Normal respiratory effort.  No retractions. Lungs CTAB. Gastrointestinal: Soft and nontender. No distention. No abdominal bruits. No CVA tenderness. Genitourinary:  Musculoskeletal: No lower extremity tenderness nor edema.  No joint effusions. Neurologic:  Normal speech and language. No gross focal neurologic deficits are appreciated. No facial droop Skin:  Skin is warm, dry and intact. No rash noted. Psychiatric: Mood and affect are normal. Speech and behavior are normal.  ____________________________________________   LABS (all labs ordered are listed, but only abnormal results are displayed)  No results found for this or any previous visit (from the past 24 hour(s)). ____________________________________________ ____________________________________________  POEUMPNTI  I personally reviewed all radiographic images ordered to evaluate for the above acute complaints and reviewed radiology reports and findings.  These findings were  personally discussed with the patient.  Please see medical record for radiology report.  ____________________________________________   PROCEDURES  Procedure(s) performed:  Procedures    Critical Care performed: no ____________________________________________   INITIAL IMPRESSION / ASSESSMENT AND PLAN / ED COURSE  Pertinent labs & imaging results that were available during my care of the patient were reviewed by me and considered in my medical decision making (see chart for details).  DDX: Retinal detachment, retinal artery occlusion, vitreous hemorrhage, macular degeneration, MS, stroke  NYZIR DUBOIS is a 72 y.o. who presents to the ED with symptoms as described above.  Not based on exam seems to be isolated to the left globe.  Clinically concerning for detachment on ultrasound I do not identify any evidence of detachment or hemorrhage.  No pain.  No evidence of intracranial hemorrhage.  Seems less clinically consistent with CVA as he has no other focal neuro deficits.  And again symptoms can be isolated to left globe not visual field.  Spoke with Dr. Wallace Going of ophthalmology who kindly agrees to evaluate patient in clinic  today for formal ophthalmic exam.  Discussed if that this remains normal patient has persistent symptoms that he can be returned to the hospital for further evaluation.      ____________________________________________   FINAL CLINICAL IMPRESSION(S) / ED DIAGNOSES  Final diagnoses:  Visual disturbance      NEW MEDICATIONS STARTED DURING THIS VISIT:  New Prescriptions   No medications on file     Note:  This document was prepared using Dragon voice recognition software and may include unintentional dictation errors.    Merlyn Lot, MD 10/30/17 1451

## 2017-10-30 NOTE — ED Triage Notes (Signed)
Pt here for blurry vision in left eye with seeing black spots and lights around everything per pt, "like a sparkle".  Started yesterday all of sudden.

## 2017-10-30 NOTE — Discharge Instructions (Signed)
Go directly to [facility]  Dr. Wallace Going will be expecting him.

## 2017-11-18 ENCOUNTER — Emergency Department: Payer: Medicare HMO

## 2017-11-18 ENCOUNTER — Inpatient Hospital Stay
Admission: EM | Admit: 2017-11-18 | Discharge: 2017-11-23 | DRG: 872 | Disposition: A | Discharge status: Home / Self Care | Payer: Medicare HMO | Attending: Internal Medicine | Admitting: Internal Medicine

## 2017-11-18 ENCOUNTER — Other Ambulatory Visit: Payer: Self-pay

## 2017-11-18 DIAGNOSIS — I130 Hypertensive heart and chronic kidney disease with heart failure and stage 1 through stage 4 chronic kidney disease, or unspecified chronic kidney disease: Secondary | ICD-10-CM | POA: Diagnosis present

## 2017-11-18 DIAGNOSIS — Z86718 Personal history of other venous thrombosis and embolism: Secondary | ICD-10-CM

## 2017-11-18 DIAGNOSIS — Z8249 Family history of ischemic heart disease and other diseases of the circulatory system: Secondary | ICD-10-CM | POA: Diagnosis not present

## 2017-11-18 DIAGNOSIS — Z85038 Personal history of other malignant neoplasm of large intestine: Secondary | ICD-10-CM

## 2017-11-18 DIAGNOSIS — Z7982 Long term (current) use of aspirin: Secondary | ICD-10-CM

## 2017-11-18 DIAGNOSIS — N179 Acute kidney failure, unspecified: Secondary | ICD-10-CM | POA: Diagnosis present

## 2017-11-18 DIAGNOSIS — Z7951 Long term (current) use of inhaled steroids: Secondary | ICD-10-CM | POA: Diagnosis not present

## 2017-11-18 DIAGNOSIS — Z79899 Other long term (current) drug therapy: Secondary | ICD-10-CM

## 2017-11-18 DIAGNOSIS — Z87891 Personal history of nicotine dependence: Secondary | ICD-10-CM | POA: Diagnosis not present

## 2017-11-18 DIAGNOSIS — I2510 Atherosclerotic heart disease of native coronary artery without angina pectoris: Secondary | ICD-10-CM | POA: Diagnosis present

## 2017-11-18 DIAGNOSIS — Z8673 Personal history of transient ischemic attack (TIA), and cerebral infarction without residual deficits: Secondary | ICD-10-CM

## 2017-11-18 DIAGNOSIS — J449 Chronic obstructive pulmonary disease, unspecified: Secondary | ICD-10-CM | POA: Diagnosis present

## 2017-11-18 DIAGNOSIS — Z7901 Long term (current) use of anticoagulants: Secondary | ICD-10-CM | POA: Diagnosis not present

## 2017-11-18 DIAGNOSIS — M25422 Effusion, left elbow: Secondary | ICD-10-CM | POA: Diagnosis present

## 2017-11-18 DIAGNOSIS — E785 Hyperlipidemia, unspecified: Secondary | ICD-10-CM | POA: Diagnosis present

## 2017-11-18 DIAGNOSIS — A419 Sepsis, unspecified organism: Principal | ICD-10-CM | POA: Diagnosis present

## 2017-11-18 DIAGNOSIS — Z9981 Dependence on supplemental oxygen: Secondary | ICD-10-CM

## 2017-11-18 DIAGNOSIS — I4891 Unspecified atrial fibrillation: Secondary | ICD-10-CM | POA: Diagnosis present

## 2017-11-18 DIAGNOSIS — R05 Cough: Secondary | ICD-10-CM

## 2017-11-18 DIAGNOSIS — N183 Chronic kidney disease, stage 3 (moderate): Secondary | ICD-10-CM | POA: Diagnosis present

## 2017-11-18 DIAGNOSIS — Z9861 Coronary angioplasty status: Secondary | ICD-10-CM | POA: Diagnosis not present

## 2017-11-18 DIAGNOSIS — Z95810 Presence of automatic (implantable) cardiac defibrillator: Secondary | ICD-10-CM

## 2017-11-18 DIAGNOSIS — I5032 Chronic diastolic (congestive) heart failure: Secondary | ICD-10-CM | POA: Diagnosis present

## 2017-11-18 DIAGNOSIS — Z952 Presence of prosthetic heart valve: Secondary | ICD-10-CM | POA: Diagnosis not present

## 2017-11-18 DIAGNOSIS — Z905 Acquired absence of kidney: Secondary | ICD-10-CM

## 2017-11-18 DIAGNOSIS — R059 Cough, unspecified: Secondary | ICD-10-CM

## 2017-11-18 DIAGNOSIS — L03114 Cellulitis of left upper limb: Secondary | ICD-10-CM | POA: Diagnosis present

## 2017-11-18 DIAGNOSIS — L0390 Cellulitis, unspecified: Secondary | ICD-10-CM

## 2017-11-18 HISTORY — DX: Unspecified atrial fibrillation: I48.91

## 2017-11-18 LAB — URINALYSIS, COMPLETE (UACMP) WITH MICROSCOPIC
Bacteria, UA: NONE SEEN
Bilirubin Urine: NEGATIVE
Glucose, UA: NEGATIVE mg/dL
KETONES UR: NEGATIVE mg/dL
Leukocytes, UA: NEGATIVE
Nitrite: NEGATIVE
PH: 5 (ref 5.0–8.0)
Protein, ur: NEGATIVE mg/dL
SPECIFIC GRAVITY, URINE: 1.013 (ref 1.005–1.030)
SQUAMOUS EPITHELIAL / LPF: NONE SEEN

## 2017-11-18 LAB — CBC WITH DIFFERENTIAL/PLATELET
Basophils Absolute: 0.1 10*3/uL (ref 0–0.1)
Basophils Relative: 1 %
EOS ABS: 0.1 10*3/uL (ref 0–0.7)
Eosinophils Relative: 1 %
HCT: 38.8 % — ABNORMAL LOW (ref 40.0–52.0)
Hemoglobin: 13 g/dL (ref 13.0–18.0)
LYMPHS ABS: 0.7 10*3/uL — AB (ref 1.0–3.6)
Lymphocytes Relative: 7 %
MCH: 30.9 pg (ref 26.0–34.0)
MCHC: 33.5 g/dL (ref 32.0–36.0)
MCV: 92.3 fL (ref 80.0–100.0)
MONOS PCT: 10 %
Monocytes Absolute: 1.1 10*3/uL — ABNORMAL HIGH (ref 0.2–1.0)
Neutro Abs: 9 10*3/uL — ABNORMAL HIGH (ref 1.4–6.5)
Neutrophils Relative %: 81 %
PLATELETS: 208 10*3/uL (ref 150–440)
RBC: 4.21 MIL/uL — ABNORMAL LOW (ref 4.40–5.90)
RDW: 13.6 % (ref 11.5–14.5)
WBC: 11 10*3/uL — ABNORMAL HIGH (ref 3.8–10.6)

## 2017-11-18 LAB — COMPREHENSIVE METABOLIC PANEL
ALBUMIN: 4.6 g/dL (ref 3.5–5.0)
ALK PHOS: 74 U/L (ref 38–126)
ALT: 17 U/L (ref 17–63)
AST: 28 U/L (ref 15–41)
Anion gap: 12 (ref 5–15)
BILIRUBIN TOTAL: 0.9 mg/dL (ref 0.3–1.2)
BUN: 31 mg/dL — AB (ref 6–20)
CO2: 28 mmol/L (ref 22–32)
CREATININE: 1.63 mg/dL — AB (ref 0.61–1.24)
Calcium: 9.5 mg/dL (ref 8.9–10.3)
Chloride: 98 mmol/L — ABNORMAL LOW (ref 101–111)
GFR calc Af Amer: 47 mL/min — ABNORMAL LOW (ref >60)
GFR calc non Af Amer: 40 mL/min — ABNORMAL LOW (ref >60)
GLUCOSE: 141 mg/dL — AB (ref 65–99)
Potassium: 3.8 mmol/L (ref 3.5–5.1)
Sodium: 138 mmol/L (ref 135–145)
TOTAL PROTEIN: 9.1 g/dL — AB (ref 6.5–8.1)

## 2017-11-18 LAB — LACTIC ACID, PLASMA
Lactic Acid, Venous: 2.4 mmol/L (ref 0.5–1.9)
Lactic Acid, Venous: 2.8 mmol/L (ref 0.5–1.9)

## 2017-11-18 LAB — CK: Total CK: 63 U/L (ref 49–397)

## 2017-11-18 MED ORDER — ACETAMINOPHEN 325 MG PO TABS
325.0000 mg | ORAL_TABLET | Freq: Four times a day (QID) | ORAL | Status: DC | PRN
  Administered 2017-11-19 – 2017-11-20 (×3): 325 mg via ORAL
  Filled 2017-11-18 (×2): qty 1

## 2017-11-18 MED ORDER — FUROSEMIDE 40 MG PO TABS
40.0000 mg | ORAL_TABLET | Freq: Two times a day (BID) | ORAL | Status: DC
  Administered 2017-11-18 – 2017-11-20 (×5): 40 mg via ORAL
  Filled 2017-11-18 (×5): qty 1

## 2017-11-18 MED ORDER — APIXABAN 5 MG PO TABS
5.0000 mg | ORAL_TABLET | Freq: Two times a day (BID) | ORAL | Status: DC
  Administered 2017-11-18 – 2017-11-23 (×10): 5 mg via ORAL
  Filled 2017-11-18 (×10): qty 1

## 2017-11-18 MED ORDER — ATORVASTATIN CALCIUM 20 MG PO TABS
80.0000 mg | ORAL_TABLET | Freq: Every day | ORAL | Status: DC
  Administered 2017-11-18 – 2017-11-21 (×4): 80 mg via ORAL
  Filled 2017-11-18 (×5): qty 4

## 2017-11-18 MED ORDER — NITROGLYCERIN 0.4 MG SL SUBL: 0.4000 mg | SUBLINGUAL_TABLET | SUBLINGUAL | Status: DC | PRN

## 2017-11-18 MED ORDER — AMIODARONE HCL 200 MG PO TABS
200.0000 mg | ORAL_TABLET | Freq: Every day | ORAL | Status: DC
  Administered 2017-11-19 – 2017-11-23 (×5): 200 mg via ORAL
  Filled 2017-11-18 (×5): qty 1

## 2017-11-18 MED ORDER — OXYCODONE-ACETAMINOPHEN 5-325 MG PO TABS
1.0000 | ORAL_TABLET | Freq: Once | ORAL | Status: AC
  Administered 2017-11-18: 1 via ORAL
  Filled 2017-11-18: qty 1

## 2017-11-18 MED ORDER — METOPROLOL SUCCINATE ER 50 MG PO TB24
200.0000 mg | ORAL_TABLET | Freq: Every day | ORAL | Status: DC
  Administered 2017-11-21 – 2017-11-23 (×2): 200 mg via ORAL
  Filled 2017-11-18 (×4): qty 4

## 2017-11-18 MED ORDER — GUAIFENESIN 100 MG/5ML PO SOLN
5.0000 mL | ORAL | Status: DC | PRN
  Filled 2017-11-18: qty 5

## 2017-11-18 MED ORDER — MOMETASONE FURO-FORMOTEROL FUM 200-5 MCG/ACT IN AERO
2.0000 | INHALATION_SPRAY | Freq: Two times a day (BID) | RESPIRATORY_TRACT | Status: DC
  Administered 2017-11-18 – 2017-11-23 (×10): 2 via RESPIRATORY_TRACT
  Filled 2017-11-18: qty 8.8

## 2017-11-18 MED ORDER — FUROSEMIDE 40 MG PO TABS: 40.0000 mg | ORAL_TABLET | Freq: Two times a day (BID) | ORAL | Status: DC

## 2017-11-18 MED ORDER — ACETAMINOPHEN 325 MG PO TABS: 650.0000 mg | ORAL_TABLET | Freq: Four times a day (QID) | ORAL | Status: DC | PRN

## 2017-11-18 MED ORDER — SPIRONOLACTONE 12.5 MG HALF TABLET
12.5000 mg | ORAL_TABLET | Freq: Every day | ORAL | Status: DC
  Administered 2017-11-19 – 2017-11-23 (×4): 12.5 mg via ORAL
  Filled 2017-11-18 (×5): qty 1

## 2017-11-18 MED ORDER — ALBUTEROL SULFATE (2.5 MG/3ML) 0.083% IN NEBU
2.5000 mg | INHALATION_SOLUTION | Freq: Four times a day (QID) | RESPIRATORY_TRACT | Status: DC | PRN
  Administered 2017-11-21 (×2): 2.5 mg via RESPIRATORY_TRACT
  Filled 2017-11-18 (×2): qty 3

## 2017-11-18 MED ORDER — DOCUSATE SODIUM 100 MG PO CAPS: 100.0000 mg | ORAL_CAPSULE | Freq: Two times a day (BID) | ORAL | Status: DC | PRN

## 2017-11-18 MED ORDER — ASPIRIN 81 MG PO CHEW
81.0000 mg | CHEWABLE_TABLET | Freq: Every day | ORAL | Status: DC
  Administered 2017-11-19 – 2017-11-23 (×5): 81 mg via ORAL
  Filled 2017-11-18 (×5): qty 1

## 2017-11-18 MED ORDER — ATORVASTATIN CALCIUM 20 MG PO TABS: 80.0000 mg | ORAL_TABLET | Freq: Every day | ORAL | Status: DC

## 2017-11-18 MED ORDER — DOCUSATE SODIUM 100 MG PO CAPS: 200.0000 mg | ORAL_CAPSULE | Freq: Two times a day (BID) | ORAL | Status: DC | PRN

## 2017-11-18 MED ORDER — ACETAMINOPHEN 500 MG PO TABS
ORAL_TABLET | ORAL | Status: AC
  Filled 2017-11-18: qty 2

## 2017-11-18 MED ORDER — SODIUM CHLORIDE 0.9 % IV SOLN
1.5000 g | Freq: Three times a day (TID) | INTRAVENOUS | Status: DC
  Administered 2017-11-18 – 2017-11-20 (×6): 1.5 g via INTRAVENOUS
  Filled 2017-11-18 (×7): qty 1.5

## 2017-11-18 MED ORDER — MAGNESIUM OXIDE 400 (241.3 MG) MG PO TABS
400.0000 mg | ORAL_TABLET | Freq: Two times a day (BID) | ORAL | Status: DC
  Administered 2017-11-18 – 2017-11-23 (×10): 400 mg via ORAL
  Filled 2017-11-18 (×10): qty 1

## 2017-11-18 MED ORDER — AMLODIPINE BESYLATE 10 MG PO TABS
10.0000 mg | ORAL_TABLET | Freq: Every day | ORAL | Status: DC
  Administered 2017-11-19 – 2017-11-23 (×5): 10 mg via ORAL
  Filled 2017-11-18 (×5): qty 1

## 2017-11-18 MED ORDER — VITAMIN D 1000 UNITS PO TABS
1000.0000 [IU] | ORAL_TABLET | Freq: Every day | ORAL | Status: DC
  Administered 2017-11-19 – 2017-11-23 (×5): 1000 [IU] via ORAL
  Filled 2017-11-18 (×5): qty 1

## 2017-11-18 NOTE — ED Provider Notes (Signed)
[facility] Emergency Department Provider Note  ___________________________________________   First MD Initiated Contact with Patient 11/18/17 1745     (approximate)  I have reviewed the triage vital signs and the nursing notes.   HISTORY  Chief Complaint left arm swelling/pain/numbness   HPI Darius Taylor is a 72 y.o. male with a history of coronary artery disease as well as DVT on Eliquis who is presenting to the emergency department with left forearm swelling.  He says that his form started to swell yesterday and has increased over the past 24 hours.  He says that he has an aching pain to the left forearm that is a 9 out of 10.  He denies any trauma or sleeping on the forearm.  States that he has been compliant with his Eliquis.  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  Denies any chest pain or shortness of breath.  Denies any numbness but says that he is having pain with range of motion of the left forearm.  Past Medical History:  Diagnosis Date  . Aortic stenosis   . Asthma   . CAD (coronary artery disease)   . Cancer [facility])    colon, bladder and kidney  . COPD (chronic obstructive pulmonary disease) ([place])   . Hypertension     Patient Active Problem List   Diagnosis Date Noted  . Hematoma   . HCAP (healthcare-associated pneumonia) 10/21/2016    Past Surgical History:  Procedure Laterality Date  . CARDIAC DEFIBRILLATOR PLACEMENT    . CORONARY ANGIOPLASTY  09/25/2016  . IRRIGATION AND DEBRIDEMENT HEMATOMA Left 10/24/2016   Procedure: IRRIGATION AND DEBRIDEMENT HEMATOMA;  Surgeon: Florene Glen, MD;  Location: [facility] ORS;  Service: General;  Laterality: Left;  . KIDNEY SURGERY     left removed    Prior to Admission medications   Medication Sig Start Date End Date Taking? Authorizing Provider  acetaminophen (TYLENOL) 325 MG tablet Take 325 mg by mouth every 6 (six) hours as needed.   Yes [provider]  albuterol  (PROVENTIL) (2.5 MG/3ML) 0.083% nebulizer solution Take 2.5 mg by nebulization every 6 (six) hours as needed for wheezing or shortness of breath.   Yes [provider]  amiodarone (PACERONE) 200 MG tablet Take 200 mg by mouth daily.   Yes [provider]  amLODipine (NORVASC) 10 MG tablet Take 10 mg by mouth daily.   Yes [provider]  apixaban (ELIQUIS) 5 MG TABS tablet Take 5 mg by mouth 2 (two) times daily.   Yes [provider]  aspirin 81 MG chewable tablet Chew 81 mg by mouth daily.   Yes [provider]  atorvastatin (LIPITOR) 80 MG tablet Take 80 mg by mouth daily.   Yes [provider]  cholecalciferol (VITAMIN D) 1000 units tablet Take 1,000 Units by mouth daily.   Yes [provider]  docusate sodium (COLACE) 100 MG capsule Take 200 mg by mouth 2 (two) times daily as needed for mild constipation.   Yes [provider]  furosemide (LASIX) 40 MG tablet Take 40 mg by mouth 2 (two) times daily.    Yes [provider]  guaiFENesin (ROBITUSSIN) 100 MG/5ML SOLN Take 5 mLs (100 mg total) by mouth every 4 (four) hours as needed for cough or to loosen phlegm. 10/26/16  Yes [name], MD  magnesium oxide (MAG-OX) 400 MG tablet Take 400 mg by mouth 2 (two) times daily.   Yes [provider]  metoprolol (TOPROL-XL) 200 MG 24 hr tablet Take 200 mg by mouth daily.   Yes [provider]  nitroGLYCERIN (NITROSTAT) 0.4 MG SL tablet Place 0.4 mg under the tongue every 5 (five) minutes as needed for chest pain.   Yes [provider]  spironolactone (ALDACTONE) 25 MG tablet Take 12.5 mg by mouth daily.   Yes [provider]  budesonide-formoterol (SYMBICORT) 160-4.5 MCG/ACT inhaler Inhale 2 puffs into the lungs 2 (two) times daily.    [provider]  predniSONE (STERAPRED UNI-PAK 21 TAB) 10 MG (21) TBPK tablet Take 6 tablets on day 1, take 5 tablets on day 2, take 4 tablets on day  3, take 3 tablets on day 4, take 2 tablets on day 5, take 1 tablet on day 6 Patient not taking: Reported on 11/18/2017 06/10/17   [name], PA-C    Allergies Patient has no known allergies.  No family history on file.  Social History Social History   Tobacco Use  . Smoking status: Former Research scientist (life sciences)  . Smokeless tobacco: Never Used  Substance Use Topics  . Alcohol use: No  . Drug use: No    Review of Systems  Constitutional: No fever/chills Eyes: Patient says that ever since February 6 he has had blurred vision to his left eye.  He was seen by ophthalmology and has a follow-up appointment this Thursday for what he describes as a "blood clot to the eye." ENT: No sore throat. Cardiovascular: Denies chest pain. Respiratory: Denies shortness of breath. Gastrointestinal: No abdominal pain.  No nausea, no vomiting.  No diarrhea.  No constipation. Genitourinary: Negative for dysuria. Musculoskeletal: Negative for back pain. Skin: Negative for rash. Neurological: Negative for headaches, focal weakness or numbness.   ____________________________________________   PHYSICAL EXAM:  VITAL SIGNS: ED Triage Vitals  Enc Vitals Group     BP 11/18/17 1727 (!) 143/102     Pulse Rate 11/18/17 1727 60     Resp 11/18/17 1727 18     Temp 11/18/17 1727 98.6 F (37 C)     Temp Source 11/18/17 1727 Oral     SpO2 11/18/17 1727 95 %     Weight 11/18/17 1728 215 lb (97.5 kg)     Height 11/18/17 1728 5\' 11"  (1.803 m)     Head Circumference --      Peak Flow --      Pain Score 11/18/17 1728 9     Pain Loc --      Pain Edu? --      Excl. in [place]? --     Constitutional: Alert and oriented. Well appearing and in no acute distress. Eyes: Conjunctivae are normal.  Head: Atraumatic. Nose: No congestion/rhinnorhea. Mouth/Throat: Mucous membranes are moist.  Neck: No stridor.   Cardiovascular: Normal rate, regular rhythm. Grossly normal heart sounds.  Good peripheral circulation with equal and  bilateral radial pulses as well as brisk capillary refill under the nailbeds. Respiratory: Normal respiratory effort.  No retractions. Lungs CTAB. Gastrointestinal: Soft and nontender. No distention.  Musculoskeletal: No lower extremity tenderness nor edema.  No joint effusions.  Left upper extremity with swelling to the distal arm as well as forearm.  The form swelling to the proximal third and while there is some degree of circumferential swelling the majority the swelling is over the dorsum distal to the elbow and extending to the proximal third of the forearm.  There is no overlying ecchymosis.  The compartments are not tense but very tender over  the dorsum.  No crepitus.  No pus.  Distally, the patient has 5 out of 5 strength.  He is able to range left elbow joint without issue and there is no palpable effusion.  Also with erythema over the affected area.  Neurologic:  Normal speech and language. No gross focal neurologic deficits are appreciated. Skin:  Skin is warm, dry and intact. No rash noted. Psychiatric: Mood and affect are normal. Speech and behavior are normal.  ____________________________________________   LABS (all labs ordered are listed, but only abnormal results are displayed)  Labs Reviewed  COMPREHENSIVE METABOLIC PANEL - Abnormal; Notable for the following components:      Result Value   Chloride 98 (*)    Glucose, Bld 141 (*)    BUN 31 (*)    Creatinine, Ser 1.63 (*)    Total Protein 9.1 (*)    GFR calc non Af Amer 40 (*)    GFR calc Af Amer 47 (*)    All other components within normal limits  LACTIC ACID, PLASMA - Abnormal; Notable for the following components:   Lactic Acid, Venous 2.4 (*)    All other components within normal limits  CBC WITH DIFFERENTIAL/PLATELET - Abnormal; Notable for the following components:   WBC 11.0 (*)    RBC 4.21 (*)    HCT 38.8 (*)    Neutro Abs 9.0 (*)    Lymphs Abs 0.7 (*)    Monocytes Absolute 1.1 (*)    All other  components within normal limits  CULTURE, BLOOD (ROUTINE X 2)  CULTURE, BLOOD (ROUTINE X 2)  CK  LACTIC ACID, PLASMA  URINALYSIS, COMPLETE (UACMP) WITH MICROSCOPIC   ____________________________________________  EKG  ED ECG REPORT I, Doran Stabler, the attending physician, personally viewed and interpreted this ECG.   Date: 11/18/2017  EKG Time: 1735  Rate: 63  Rhythm: Atrial paced rhythm  Axis: Left axis  Intervals:none  ST&T Change: No ST segment elevation or depression.  T wave inversions in V5. No significant change from previous EKG of 09/28/2016 ____________________________________________  RADIOLOGY  No DVT to the left upper extremity but there is a significant amount of edema visualized on the ultrasound ____________________________________________   PROCEDURES  Procedure(s) performed:   Procedures  Critical Care performed:   ____________________________________________   INITIAL IMPRESSION / ASSESSMENT AND PLAN / ED COURSE  Pertinent labs & imaging results that were available during my care of the patient were reviewed by me and considered in my medical decision making (see chart for details).  DDX: Cellulitis, compartment syndrome, hematoma, peripheral edema, DVT As part of my medical decision making, I reviewed the following data within the [facility] Notes from prior ED visits  ----------------------------------------- 7:03 PM on 11/18/2017 -----------------------------------------  Patient with elevated lactic acid of 2.4 as well as elevated white blood cell count.  Progression of symptoms seem to be fairly fast as the patient has only been systematic for 1 day at this time.  Likely cellulitis.  Patient will be placed on antibiotics. ____________________________________________   FINAL CLINICAL IMPRESSION(S) / ED DIAGNOSES  Sepsis.  Cellulitis    NEW MEDICATIONS STARTED DURING THIS VISIT:  New Prescriptions   No  medications on file     Note:  This document was prepared using Dragon voice recognition software and may include unintentional dictation errors.     Orbie Pyo, MD 11/18/17 [phone]

## 2017-11-18 NOTE — ED Triage Notes (Addendum)
Pt c/o swelling to the left FA with pain and some numbness, unable to palpate a radial pulse at present. Cap refill>3.Marland Kitchen Pt has a hx of DVT, and clot in eye and is currently on elaquist. Was able to obtain radial pulse with doppler.Marland Kitchen

## 2017-11-18 NOTE — ED Notes (Signed)
Pt has pain and swelling to left arm since last night.  No known injury.  States painful to move arm.  Good distal pulses.  Pt alert. Speech clear.

## 2017-11-18 NOTE — H&P (Signed)
[place] [facility] [place] NAME: Darius Taylor    MR#:  [id number]  DATE OF BIRTH:  Jul 24, 1946  DATE OF ADMISSION:  11/18/2017  PRIMARY CARE PHYSICIAN: [facility]   REQUESTING/REFERRING PHYSICIAN: [email or web address]  CHIEF COMPLAINT:   Chief Complaint  Patient presents with  . left arm swelling/pain/numbness    HISTORY OF PRESENT ILLNESS: Darius Taylor  is a 72 y.o. male with a known history of aortic stenosis, atrial fibrillation, coronary artery disease, colon cancer, COPD, hypertension- lives independent and his daughter lives nearby. Since yesterday evening started noticing swelling near his left elbow which is progressively and rapidly getting worse to the point that he is hurting there and his fingers on the same side are getting numb. He denies any fever or injuries, he denies any bug bites on his arm. Ultrasound was done to rule out DVT in ER. In given to hospitalist team for management of cellulitis.  PAST MEDICAL HISTORY:   Past Medical History:  Diagnosis Date  . Aortic stenosis   . Asthma   . Atrial fibrillation ([place])   . CAD (coronary artery disease)   . Cancer [facility])    colon, bladder and kidney  . COPD (chronic obstructive pulmonary disease) ([place])   . Hypertension     PAST SURGICAL HISTORY:  Past Surgical History:  Procedure Laterality Date  . CARDIAC DEFIBRILLATOR PLACEMENT    . CORONARY ANGIOPLASTY  09/25/2016  . IRRIGATION AND DEBRIDEMENT HEMATOMA Left 10/24/2016   Procedure: IRRIGATION AND DEBRIDEMENT HEMATOMA;  Surgeon: Florene Glen, MD;  Location: [facility] ORS;  Service: General;  Laterality: Left;  . KIDNEY SURGERY     left removed    SOCIAL HISTORY:  Social History   Tobacco Use  . Smoking status: Former Research scientist (life sciences)  . Smokeless tobacco: Never Used  Substance Use Topics  . Alcohol use: No    FAMILY HISTORY:  Family History  Problem Relation Age of Onset  . Hypertension Mother     DRUG ALLERGIES:  No Known Allergies  REVIEW OF SYSTEMS:   CONSTITUTIONAL: No fever, fatigue or weakness.  EYES: No blurred or double vision.  EARS, NOSE, AND THROAT: No tinnitus or ear pain.  RESPIRATORY: No cough, shortness of breath, wheezing or hemoptysis.  CARDIOVASCULAR: No chest pain, orthopnea, edema.  GASTROINTESTINAL: No nausea, vomiting, diarrhea or abdominal pain.  GENITOURINARY: No dysuria, hematuria.  ENDOCRINE: No polyuria, nocturia,  HEMATOLOGY: No anemia, easy bruising or bleeding SKIN: No rash or lesion. Redness and swelling around left elbow. MUSCULOSKELETAL: No joint pain or arthritis.   NEUROLOGIC: No tingling, numbness, weakness.  PSYCHIATRY: No anxiety or depression.   MEDICATIONS AT HOME:  Prior to Admission medications   Medication Sig Start Date End Date Taking? Authorizing Provider  acetaminophen (TYLENOL) 325 MG tablet Take 325 mg by mouth every 6 (six) hours as needed.   Yes [provider]  albuterol (PROVENTIL) (2.5 MG/3ML) 0.083% nebulizer solution Take 2.5 mg by nebulization every 6 (six) hours as needed for wheezing or shortness of breath.   Yes [provider]  amiodarone (PACERONE) 200 MG tablet Take 200 mg by mouth daily.   Yes [provider]  amLODipine (NORVASC) 10 MG tablet Take 10 mg by mouth daily.   Yes [provider]  apixaban (ELIQUIS) 5 MG TABS tablet Take 5 mg by mouth 2 (two) times daily.   Yes [provider]  aspirin 81 MG chewable tablet Chew 81 mg by mouth  daily.   Yes [provider]  atorvastatin (LIPITOR) 80 MG tablet Take 80 mg by mouth daily.   Yes [provider]  cholecalciferol (VITAMIN D) 1000 units tablet Take 1,000 Units by mouth daily.   Yes [provider]  docusate sodium (COLACE) 100 MG capsule Take 200 mg by mouth 2 (two) times daily as needed for mild constipation.   Yes [provider]  furosemide (LASIX) 40 MG tablet Take 40 mg by mouth 2 (two) times  daily.    Yes [provider]  guaiFENesin (ROBITUSSIN) 100 MG/5ML SOLN Take 5 mLs (100 mg total) by mouth every 4 (four) hours as needed for cough or to loosen phlegm. 10/26/16  Yes [name], MD  magnesium oxide (MAG-OX) 400 MG tablet Take 400 mg by mouth 2 (two) times daily.   Yes [provider]  metoprolol (TOPROL-XL) 200 MG 24 hr tablet Take 200 mg by mouth daily.   Yes [provider]  nitroGLYCERIN (NITROSTAT) 0.4 MG SL tablet Place 0.4 mg under the tongue every 5 (five) minutes as needed for chest pain.   Yes [provider]  spironolactone (ALDACTONE) 25 MG tablet Take 12.5 mg by mouth daily.   Yes [provider]  budesonide-formoterol (SYMBICORT) 160-4.5 MCG/ACT inhaler Inhale 2 puffs into the lungs 2 (two) times daily.    [provider]  predniSONE (STERAPRED UNI-PAK 21 TAB) 10 MG (21) TBPK tablet Take 6 tablets on day 1, take 5 tablets on day 2, take 4 tablets on day 3, take 3 tablets on day 4, take 2 tablets on day 5, take 1 tablet on day 6 Patient not taking: Reported on 11/18/2017 06/10/17   [name], PA-C      PHYSICAL EXAMINATION:   VITAL SIGNS: Blood pressure 132/82, pulse 63, temperature 98.6 F (37 C), temperature source Oral, resp. rate 18, height 5\' 11"  (1.803 m), weight 97.5 kg (215 lb), SpO2 96 %.  GENERAL:  72 y.o.-year-old patient lying in the bed with no acute distress.  EYES: Pupils equal, round, reactive to light and accommodation. No scleral icterus. Extraocular muscles intact.  HEENT: Head atraumatic, normocephalic. Oropharynx and nasopharynx clear.  NECK:  Supple, no jugular venous distention. No thyroid enlargement, no tenderness.  LUNGS: Normal breath sounds bilaterally, no wheezing, rales,rhonchi or crepitation. No use of accessory muscles of respiration.  CARDIOVASCULAR: S1, S2 normal. No murmurs, rubs, or gallops.  ABDOMEN: Soft, nontender, nondistended. Bowel sounds present. No organomegaly or  mass.  EXTREMITIES: No pedal edema, cyanosis, or clubbing. On the dorsal part of left forearm near the elbow there is a soft swelling and redness present which is roughly 10 cmX 15 cm size. Distal pulses are palpable and movement of elbow And fingers are preserved. NEUROLOGIC: Cranial nerves II through XII are intact. Muscle strength 5/5 in all extremities. Sensation intact. Gait not checked.  PSYCHIATRIC: The patient is alert and oriented x 3.  SKIN: No obvious rash, lesion, or ulcer.   LABORATORY PANEL:   CBC Recent Labs  Lab 11/18/17 1731  WBC 11.0*  HGB 13.0  HCT 38.8*  PLT 208  MCV 92.3  MCH 30.9  MCHC 33.5  RDW 13.6  LYMPHSABS 0.7*  MONOABS 1.1*  EOSABS 0.1  BASOSABS 0.1   ------------------------------------------------------------------------------------------------------------------  Chemistries  Recent Labs  Lab 11/18/17 1731  NA 138  K 3.8  CL 98*  CO2 28  GLUCOSE 141*  BUN 31*  CREATININE 1.63*  CALCIUM 9.5  AST 28  ALT 17  ALKPHOS 74  BILITOT 0.9   ------------------------------------------------------------------------------------------------------------------ estimated creatinine clearance is 48.1 mL/min (A) (by C-G formula based on SCr of 1.63 mg/dL (H)). ------------------------------------------------------------------------------------------------------------------ No results for input(s): TSH, T4TOTAL, T3FREE, THYROIDAB in the last 72 hours.  Invalid input(s): FREET3   Coagulation profile No results for input(s): INR, PROTIME in the last 168 hours. ------------------------------------------------------------------------------------------------------------------- No results for input(s): DDIMER in the last 72 hours. -------------------------------------------------------------------------------------------------------------------  Cardiac Enzymes No results for input(s): CKMB, TROPONINI, MYOGLOBIN in the last 168 hours.  Invalid  input(s): CK ------------------------------------------------------------------------------------------------------------------ Invalid input(s): POCBNP  ---------------------------------------------------------------------------------------------------------------  Urinalysis    Component Value Date/Time   COLORURINE YELLOW (A) 11/18/2017 1919   APPEARANCEUR CLEAR (A) 11/18/2017 1919   APPEARANCEUR Clear 01/19/2013 1957   LABSPEC 1.013 11/18/2017 1919   LABSPEC 1.025 01/19/2013 1957   PHURINE 5.0 11/18/2017 1919   GLUCOSEU NEGATIVE 11/18/2017 1919   GLUCOSEU Negative 01/19/2013 1957   HGBUR SMALL (A) 11/18/2017 1919   BILIRUBINUR NEGATIVE 11/18/2017 1919   BILIRUBINUR Negative 01/19/2013 1957   KETONESUR NEGATIVE 11/18/2017 1919   PROTEINUR NEGATIVE 11/18/2017 1919   NITRITE NEGATIVE 11/18/2017 1919   LEUKOCYTESUR NEGATIVE 11/18/2017 1919   LEUKOCYTESUR 2+ 01/19/2013 1957     RADIOLOGY: Dg Chest 2 View  Result Date: 11/18/2017 CLINICAL DATA:  Shortness of breath EXAM: CHEST  2 VIEW COMPARISON:  October 26, 2016 FINDINGS: The heart size and mediastinal contours are stable. The heart size is enlarged. Cardiac pacemaker is unchanged. Both lungs are clear. The visualized skeletal structures are unremarkable. IMPRESSION: Cardiomegaly.  No pulmonary edema or focal pneumonia. Electronically Signed   By: Abelardo Diesel M.D.   On: 11/18/2017 19:16   US Venous Img Upper Uni Left  Result Date: 11/18/2017 CLINICAL DATA:  Left upper extremity swelling and pain. EXAM: Left UPPER EXTREMITY VENOUS DOPPLER ULTRASOUND TECHNIQUE: Gray-scale sonography with graded compression, as well as color Doppler and duplex ultrasound were performed to evaluate the upper extremity deep venous system from the level of the subclavian vein and including the jugular, axillary, basilic, radial, ulnar and upper cephalic vein. Spectral Doppler was utilized to evaluate flow at rest and with distal augmentation maneuvers.  COMPARISON:  None. FINDINGS: Contralateral Subclavian Vein: Respiratory phasicity is normal and symmetric with the symptomatic side. No evidence of thrombus. Normal compressibility. Internal Jugular Vein: No evidence of thrombus. Normal compressibility, respiratory phasicity and response to augmentation. Subclavian Vein: No evidence of thrombus. Normal compressibility, respiratory phasicity and response to augmentation. Axillary Vein: No evidence of thrombus. Normal compressibility, respiratory phasicity and response to augmentation. Cephalic Vein: No evidence of thrombus. Normal compressibility, respiratory phasicity and response to augmentation. Basilic Vein: No evidence of thrombus. Normal compressibility, respiratory phasicity and response to augmentation. Brachial Veins: No evidence of thrombus. Normal compressibility, respiratory phasicity and response to augmentation. Radial Veins: No evidence of thrombus. Normal compressibility, respiratory phasicity and response to augmentation. Ulnar Veins: No evidence of thrombus. Normal compressibility, respiratory phasicity and response to augmentation. Venous Reflux:  None visualized. Other Findings: Significant amount of soft tissue edema is seen in the dorsal soft tissues of the forearm. IMPRESSION: No evidence of DVT within the left upper extremity. Significant amount of soft tissue edema seen in dorsal left forearm. Electronically Signed   By: Marijo Conception, M.D.   On: 11/18/2017 18:50    EKG: Orders placed or performed during the hospital encounter of 11/18/17  . EKG 12-Lead  . EKG 12-Lead  . EKG 12-Lead  . EKG 12-Lead  IMPRESSION AND PLAN:  * Cellulitis   Will give IV Unasyn.   Ultrasound study is negative for DVT.   Blood culture is sent by ER.  * A. Fib   Continue amiodarone, metoprolol, anticoagulation  * Hypertension   Continue amlodipine, metoprolol, spironolactone  * Hyperlipidemia   Continue atorvastatin.  * COPD   Currently  no exacerbation symptoms, continue inhalers.  *  CK D stage III, slight worsening.   Continue monitoring.  All the records are reviewed and case discussed with ED provider. Management plans discussed with the patient, family and they are in agreement.  CODE STATUS:Full code  Code Status History    Date Active Date Inactive Code Status Order ID Comments User Context   10/21/2016 12:16 10/26/2016 22:47 Full Code [id number]  Bettey Costa, MD Inpatient       TOTAL TIME TAKING CARE OF THIS PATIENT: 50 minutes.    Vaughan Basta M.D on 11/18/2017   Between 7am to 6pm - Pager - [phone]  After 6pm go to [email or web address] - password [email or web address] [facility]  Office  [phone]  CC: Primary care physician; [employer]   Note: This dictation was prepared with Diplomatic Services operational officer dictation along with smaller Company secretary. Any transcriptional errors that result from this process are unintentional.

## 2017-11-18 NOTE — ED Notes (Signed)
Report called to [place] rn floor nurse

## 2017-11-19 LAB — CBC
HEMATOCRIT: 36 % — AB (ref 40.0–52.0)
Hemoglobin: 12 g/dL — ABNORMAL LOW (ref 13.0–18.0)
MCH: 30.8 pg (ref 26.0–34.0)
MCHC: 33.2 g/dL (ref 32.0–36.0)
MCV: 92.7 fL (ref 80.0–100.0)
Platelets: 199 10*3/uL (ref 150–440)
RBC: 3.88 MIL/uL — ABNORMAL LOW (ref 4.40–5.90)
RDW: 13.9 % (ref 11.5–14.5)
WBC: 11.4 10*3/uL — ABNORMAL HIGH (ref 3.8–10.6)

## 2017-11-19 LAB — BASIC METABOLIC PANEL
Anion gap: 9 (ref 5–15)
BUN: 32 mg/dL — AB (ref 6–20)
CHLORIDE: 101 mmol/L (ref 101–111)
CO2: 29 mmol/L (ref 22–32)
CREATININE: 1.72 mg/dL — AB (ref 0.61–1.24)
Calcium: 9 mg/dL (ref 8.9–10.3)
GFR calc Af Amer: 44 mL/min — ABNORMAL LOW (ref >60)
GFR calc non Af Amer: 38 mL/min — ABNORMAL LOW (ref >60)
GLUCOSE: 123 mg/dL — AB (ref 65–99)
POTASSIUM: 3.5 mmol/L (ref 3.5–5.1)
Sodium: 139 mmol/L (ref 135–145)

## 2017-11-19 LAB — LACTIC ACID, PLASMA: Lactic Acid, Venous: 1.1 mmol/L (ref 0.5–1.9)

## 2017-11-19 LAB — MRSA PCR SCREENING: MRSA by PCR: NEGATIVE

## 2017-11-19 MED ORDER — ONDANSETRON HCL 4 MG/2ML IJ SOLN
4.0000 mg | Freq: Four times a day (QID) | INTRAMUSCULAR | Status: DC | PRN
  Administered 2017-11-19 – 2017-11-21 (×2): 4 mg via INTRAVENOUS
  Filled 2017-11-19 (×2): qty 2

## 2017-11-19 MED ORDER — SODIUM CHLORIDE 0.9 % IV SOLN
INTRAVENOUS | Status: AC
  Administered 2017-11-19: 11:00:00 via INTRAVENOUS

## 2017-11-19 NOTE — Progress Notes (Signed)
[place] [facility] [place] NAME: Darius Taylor    MR#:  [id number]  DATE OF BIRTH:  04/01/1946  SUBJECTIVE:  CHIEF COMPLAINT:  Pts left hand pain and redness significantly improved.  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  No other complaints  REVIEW OF SYSTEMS:  CONSTITUTIONAL: No fever, fatigue or weakness.  EYES: No blurred or double vision.  EARS, NOSE, AND THROAT: No tinnitus or ear pain.  RESPIRATORY: No cough, shortness of breath, wheezing or hemoptysis.  CARDIOVASCULAR: No chest pain, orthopnea, edema.  GASTROINTESTINAL: No nausea, vomiting, diarrhea or abdominal pain.  GENITOURINARY: No dysuria, hematuria.  ENDOCRINE: No polyuria, nocturia,  HEMATOLOGY: No anemia, easy bruising or bleeding SKIN: No rash or lesion. MUSCULOSKELETAL: Left hand pain and redness improved no joint pain or arthritis.   NEUROLOGIC: No tingling, numbness, weakness.  PSYCHIATRY: No anxiety or depression.   DRUG ALLERGIES:  No Known Allergies  VITALS:  Blood pressure 116/71, pulse (!) 59, temperature 99.2 F (37.3 C), temperature source Oral, resp. rate 18, height 5\' 11"  (1.803 m), weight 95.5 kg (210 lb 8 oz), SpO2 92 %.  PHYSICAL EXAMINATION:  GENERAL:  72 y.o.-year-old patient lying in the bed with no acute distress.  EYES: Pupils equal, round, reactive to light and accommodation. No scleral icterus. Extraocular muscles intact.  HEENT: Head atraumatic, normocephalic. Oropharynx and nasopharynx clear.  NECK:  Supple, no jugular venous distention. No thyroid enlargement, no tenderness.  LUNGS: Normal breath sounds bilaterally, no wheezing, rales,rhonchi or crepitation. No use of accessory muscles of respiration.  CARDIOVASCULAR: S1, S2 normal. No murmurs, rubs, or gallops.  ABDOMEN: Soft, nontender, nondistended. Bowel sounds present. No organomegaly or mass.  EXTREMITIES: Left upper arm and elbow area with very minimal erythema and  tenderness.  No pedal edema, cyanosis, or clubbing.  NEUROLOGIC: Cranial nerves II through XII are intact. Muscle strength 5/5 in all extremities. Sensation intact. Gait not checked.  PSYCHIATRIC: The patient is alert and oriented x 3.  SKIN: No obvious rash, lesion, or ulcer.    LABORATORY PANEL:   CBC Recent Labs  Lab 11/19/17 0324  WBC 11.4*  HGB 12.0*  HCT 36.0*  PLT 199   ------------------------------------------------------------------------------------------------------------------  Chemistries  Recent Labs  Lab 11/18/17 1731 11/19/17 0324  NA 138 139  K 3.8 3.5  CL 98* 101  CO2 28 29  GLUCOSE 141* 123*  BUN 31* 32*  CREATININE 1.63* 1.72*  CALCIUM 9.5 9.0  AST 28  --   ALT 17  --   ALKPHOS 74  --   BILITOT 0.9  --    ------------------------------------------------------------------------------------------------------------------  Cardiac Enzymes No results for input(s): TROPONINI in the last 168 hours. ------------------------------------------------------------------------------------------------------------------  RADIOLOGY:  Dg Chest 2 View  Result Date: 11/18/2017 CLINICAL DATA:  Shortness of breath EXAM: CHEST  2 VIEW COMPARISON:  October 26, 2016 FINDINGS: The heart size and mediastinal contours are stable. The heart size is enlarged. Cardiac pacemaker is unchanged. Both lungs are clear. The visualized skeletal structures are unremarkable. IMPRESSION: Cardiomegaly.  No pulmonary edema or focal pneumonia. Electronically Signed   By: Abelardo Diesel M.D.   On: 11/18/2017 19:16   US Venous Img Upper Uni Left  Result Date: 11/18/2017 CLINICAL DATA:  Left upper extremity swelling and pain. EXAM: Left UPPER EXTREMITY VENOUS DOPPLER ULTRASOUND TECHNIQUE: Gray-scale sonography with graded compression, as well as color Doppler and duplex ultrasound were performed to evaluate the upper extremity deep venous system from the level of  the subclavian vein and  including the jugular, axillary, basilic, radial, ulnar and upper cephalic vein. Spectral Doppler was utilized to evaluate flow at rest and with distal augmentation maneuvers. COMPARISON:  None. FINDINGS: Contralateral Subclavian Vein: Respiratory phasicity is normal and symmetric with the symptomatic side. No evidence of thrombus. Normal compressibility. Internal Jugular Vein: No evidence of thrombus. Normal compressibility, respiratory phasicity and response to augmentation. Subclavian Vein: No evidence of thrombus. Normal compressibility, respiratory phasicity and response to augmentation. Axillary Vein: No evidence of thrombus. Normal compressibility, respiratory phasicity and response to augmentation. Cephalic Vein: No evidence of thrombus. Normal compressibility, respiratory phasicity and response to augmentation. Basilic Vein: No evidence of thrombus. Normal compressibility, respiratory phasicity and response to augmentation. Brachial Veins: No evidence of thrombus. Normal compressibility, respiratory phasicity and response to augmentation. Radial Veins: No evidence of thrombus. Normal compressibility, respiratory phasicity and response to augmentation. Ulnar Veins: No evidence of thrombus. Normal compressibility, respiratory phasicity and response to augmentation. Venous Reflux:  None visualized. Other Findings: Significant amount of soft tissue edema is seen in the dorsal soft tissues of the forearm. IMPRESSION: No evidence of DVT within the left upper extremity. Significant amount of soft tissue edema seen in dorsal left forearm. Electronically Signed   By: Marijo Conception, M.D.   On: 11/18/2017 18:50    EKG:   Orders placed or performed during the hospital encounter of 11/18/17  . EKG 12-Lead  . EKG 12-Lead  . EKG 12-Lead  . EKG 12-Lead    ASSESSMENT AND PLAN:   * Left arm cellulitis Clinically improving we will continue IV Unasyn   Ultrasound study is negative for DVT.   Blood culture  negative so far  * A. Fib   Continue amiodarone, metoprolol, anticoagulation  * Hypertension   Continue amlodipine, metoprolol, spironolactone  * Hyperlipidemia   Continue atorvastatin.  * COPD   Currently no exacerbation symptoms, continue inhalers.  *  CK D stage III, slight worsening. Patient is status post left-sided nephrectomy Baseline creatinine at 1.2 at the time of admission 1.63-1.72 today, IV fluids, avoid nephrotoxins   Continue monitoring.       All the records are reviewed and case discussed with Care Management/Social Workerr. Management plans discussed with the patient, family and they are in agreement.  CODE STATUS: fc  TOTAL TIME TAKING CARE OF THIS PATIENT: 35 minutes.   POSSIBLE D/C IN 1 DAYS, DEPENDING ON CLINICAL CONDITION.  Note: This dictation was prepared with Dragon dictation along with smaller phrase technology. Any transcriptional errors that result from this process are unintentional.   Nicholes Mango M.D on 11/19/2017 at 9:07 PM  Between 7am to 6pm - Pager - [phone] After 6pm go to [email or web address] - password [email or web address] [facility]  [facility]  Office  [phone]  CC: Primary care physician; [employer]

## 2017-11-19 NOTE — Progress Notes (Signed)
Surgical MRSA PCR sent

## 2017-11-19 NOTE — Progress Notes (Signed)
Patient was transferred from the ED via a stretcher. On admission to the unit patient was A&O X4, ambulatory, and denied been in pain. Patient was oriented to his room and care plan was discussed with patient. Patient has no acute event overnight, patient c/o of nausea was relieved with PRN antiemetic.

## 2017-11-19 NOTE — Progress Notes (Signed)
Pts O2 sat 88 % on room air. Pt placed on 2 L nasal cannula. MD Gouru notified. No new orders.

## 2017-11-20 LAB — CBC
HEMATOCRIT: 31.6 % — AB (ref 40.0–52.0)
HEMOGLOBIN: 10.5 g/dL — AB (ref 13.0–18.0)
MCH: 30.8 pg (ref 26.0–34.0)
MCHC: 33.2 g/dL (ref 32.0–36.0)
MCV: 92.8 fL (ref 80.0–100.0)
Platelets: 162 10*3/uL (ref 150–440)
RBC: 3.41 MIL/uL — AB (ref 4.40–5.90)
RDW: 13.8 % (ref 11.5–14.5)
WBC: 9.1 10*3/uL (ref 3.8–10.6)

## 2017-11-20 LAB — BASIC METABOLIC PANEL
ANION GAP: 8 (ref 5–15)
BUN: 30 mg/dL — ABNORMAL HIGH (ref 6–20)
CHLORIDE: 103 mmol/L (ref 101–111)
CO2: 26 mmol/L (ref 22–32)
Calcium: 8.2 mg/dL — ABNORMAL LOW (ref 8.9–10.3)
Creatinine, Ser: 1.68 mg/dL — ABNORMAL HIGH (ref 0.61–1.24)
GFR calc non Af Amer: 39 mL/min — ABNORMAL LOW (ref >60)
GFR, EST AFRICAN AMERICAN: 45 mL/min — AB (ref >60)
Glucose, Bld: 109 mg/dL — ABNORMAL HIGH (ref 65–99)
POTASSIUM: 3.8 mmol/L (ref 3.5–5.1)
Sodium: 137 mmol/L (ref 135–145)

## 2017-11-20 MED ORDER — AMOXICILLIN-POT CLAVULANATE 875-125 MG PO TABS
1.0000 | ORAL_TABLET | Freq: Two times a day (BID) | ORAL | Status: DC
  Administered 2017-11-20: 1 via ORAL
  Filled 2017-11-20: qty 1

## 2017-11-20 MED ORDER — OXYCODONE HCL 5 MG PO TABS
5.0000 mg | ORAL_TABLET | Freq: Four times a day (QID) | ORAL | Status: DC | PRN
  Administered 2017-11-20 – 2017-11-23 (×2): 5 mg via ORAL
  Filled 2017-11-20 (×2): qty 1

## 2017-11-20 MED ORDER — SODIUM CHLORIDE 0.9 % IV SOLN
INTRAVENOUS | Status: DC
Start: 2017-11-20 — End: 2017-11-21
  Administered 2017-11-20 – 2017-11-21 (×2): via INTRAVENOUS

## 2017-11-20 NOTE — Progress Notes (Signed)
[facility] [place] NAME: Darius Taylor    MR#:  [id number]  DATE OF BIRTH:  23-Feb-1946  SUBJECTIVE:  CHIEF COMPLAINT:  Pts left hand  swelling and redness improved.  Still hurts and asking for strong pain medicine reports that he had chronic left-sided nephrectomy.  No other complaints  REVIEW OF SYSTEMS:  CONSTITUTIONAL: No fever, fatigue or weakness.  EYES: No blurred or double vision.  EARS, NOSE, AND THROAT: No tinnitus or ear pain.  RESPIRATORY: No cough, shortness of breath, wheezing or hemoptysis.  CARDIOVASCULAR: No chest pain, orthopnea, edema.  GASTROINTESTINAL: No nausea, vomiting, diarrhea or abdominal pain.  GENITOURINARY: No dysuria, hematuria.  ENDOCRINE: No polyuria, nocturia,  HEMATOLOGY: No anemia, easy bruising or bleeding SKIN: No rash or lesion. MUSCULOSKELETAL: Left hand pain and redness improved no joint pain or arthritis.   NEUROLOGIC: No tingling, numbness, weakness.  PSYCHIATRY: No anxiety or depression.   DRUG ALLERGIES:  No Known Allergies  VITALS:  Blood pressure 119/72, pulse (!) 59, temperature 98.2 F (36.8 C), temperature source Oral, resp. rate 18, height 5\' 11"  (1.803 m), weight 95.5 kg (210 lb 8 oz), SpO2 90 %.  PHYSICAL EXAMINATION:  GENERAL:  72 y.o.-year-old patient lying in the bed with no acute distress.  EYES: Pupils equal, round, reactive to light and accommodation. No scleral icterus. Extraocular muscles intact.  HEENT: Head atraumatic, normocephalic. Oropharynx and nasopharynx clear.  NECK:  Supple, no jugular venous distention. No thyroid enlargement, no tenderness.  LUNGS: Normal breath sounds bilaterally, no wheezing, rales,rhonchi or crepitation. No use of accessory muscles of respiration.  CARDIOVASCULAR: S1, S2 normal. No murmurs, rubs, or gallops.  ABDOMEN: Soft, nontender, nondistended. Bowel sounds present. No organomegaly or mass.  EXTREMITIES: Left upper arm and elbow  area with very minimal erythema and tenderness.  No pedal edema, cyanosis, or clubbing.  NEUROLOGIC: Cranial nerves II through XII are intact. Muscle strength 5/5 in all extremities. Sensation intact. Gait not checked.  PSYCHIATRIC: The patient is alert and oriented x 3.  SKIN: No obvious rash, lesion, or ulcer.    LABORATORY PANEL:   CBC Recent Labs  Lab 11/20/17 0700  WBC 9.1  HGB 10.5*  HCT 31.6*  PLT 162   ------------------------------------------------------------------------------------------------------------------  Chemistries  Recent Labs  Lab 11/18/17 1731  11/20/17 0700  NA 138   < > 137  K 3.8   < > 3.8  CL 98*   < > 103  CO2 28   < > 26  GLUCOSE 141*   < > 109*  BUN 31*   < > 30*  CREATININE 1.63*   < > 1.68*  CALCIUM 9.5   < > 8.2*  AST 28  --   --   ALT 17  --   --   ALKPHOS 74  --   --   BILITOT 0.9  --   --    < > = values in this interval not displayed.   ------------------------------------------------------------------------------------------------------------------  Cardiac Enzymes No results for input(s): TROPONINI in the last 168 hours. ------------------------------------------------------------------------------------------------------------------  RADIOLOGY:  Dg Chest 2 View  Result Date: 11/18/2017 CLINICAL DATA:  Shortness of breath EXAM: CHEST  2 VIEW COMPARISON:  October 26, 2016 FINDINGS: The heart size and mediastinal contours are stable. The heart size is enlarged. Cardiac pacemaker is unchanged. Both lungs are clear. The visualized skeletal structures are unremarkable. IMPRESSION: Cardiomegaly.  No pulmonary edema or focal pneumonia. Electronically Signed   By: Seward Meth  Augustin Coupe M.D.   On: 11/18/2017 19:16   US Venous Img Upper Uni Left  Result Date: 11/18/2017 CLINICAL DATA:  Left upper extremity swelling and pain. EXAM: Left UPPER EXTREMITY VENOUS DOPPLER ULTRASOUND TECHNIQUE: Gray-scale sonography with graded compression, as well as  color Doppler and duplex ultrasound were performed to evaluate the upper extremity deep venous system from the level of the subclavian vein and including the jugular, axillary, basilic, radial, ulnar and upper cephalic vein. Spectral Doppler was utilized to evaluate flow at rest and with distal augmentation maneuvers. COMPARISON:  None. FINDINGS: Contralateral Subclavian Vein: Respiratory phasicity is normal and symmetric with the symptomatic side. No evidence of thrombus. Normal compressibility. Internal Jugular Vein: No evidence of thrombus. Normal compressibility, respiratory phasicity and response to augmentation. Subclavian Vein: No evidence of thrombus. Normal compressibility, respiratory phasicity and response to augmentation. Axillary Vein: No evidence of thrombus. Normal compressibility, respiratory phasicity and response to augmentation. Cephalic Vein: No evidence of thrombus. Normal compressibility, respiratory phasicity and response to augmentation. Basilic Vein: No evidence of thrombus. Normal compressibility, respiratory phasicity and response to augmentation. Brachial Veins: No evidence of thrombus. Normal compressibility, respiratory phasicity and response to augmentation. Radial Veins: No evidence of thrombus. Normal compressibility, respiratory phasicity and response to augmentation. Ulnar Veins: No evidence of thrombus. Normal compressibility, respiratory phasicity and response to augmentation. Venous Reflux:  None visualized. Other Findings: Significant amount of soft tissue edema is seen in the dorsal soft tissues of the forearm. IMPRESSION: No evidence of DVT within the left upper extremity. Significant amount of soft tissue edema seen in dorsal left forearm. Electronically Signed   By: Marijo Conception, M.D.   On: 11/18/2017 18:50    EKG:   Orders placed or performed during the hospital encounter of 11/18/17  . EKG 12-Lead  . EKG 12-Lead  . EKG 12-Lead  . EKG 12-Lead    ASSESSMENT  AND PLAN:   * Left arm cellulitis Clinically improving we will continue IV Unasyn   Ultrasound study is negative for DVT.   Blood culture negative so far  * A. Fib   Continue amiodarone, metoprolol, anticoagulation  * Hypertension   Continue amlodipine, metoprolol, spironolactone  * Hyperlipidemia   Continue atorvastatin.  * COPD   Currently no exacerbation symptoms, continue inhalers.  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 Patient is status post left-sided nephrectomy Baseline creatinine at 1.2 at the time of admission 1.63-1.72-1.68 today, IV fluids, avoid nephrotoxins   Continue monitoring.       All the records are reviewed and case discussed with Care Management/Social Workerr. Management plans discussed with the patient, family and they are in agreement.  CODE STATUS: fc  TOTAL TIME TAKING CARE OF THIS PATIENT: 35 minutes.   POSSIBLE D/C IN 1 DAYS, DEPENDING ON CLINICAL CONDITION.  Note: This dictation was prepared with Dragon dictation along with smaller phrase technology. Any transcriptional errors that result from this process are unintentional.   Nicholes Mango M.D on 11/20/2017 at 12:42 PM  Between 7am to 6pm - Pager - [phone] After 6pm go to [email or web address] - password [email or web address] [facility]  [facility]  Office  [phone]  CC: Primary care physician; [employer]

## 2017-11-21 ENCOUNTER — Inpatient Hospital Stay: Payer: Medicare HMO

## 2017-11-21 LAB — BASIC METABOLIC PANEL
ANION GAP: 7 (ref 5–15)
BUN: 29 mg/dL — AB (ref 6–20)
CO2: 26 mmol/L (ref 22–32)
Calcium: 8.4 mg/dL — ABNORMAL LOW (ref 8.9–10.3)
Chloride: 100 mmol/L — ABNORMAL LOW (ref 101–111)
Creatinine, Ser: 1.53 mg/dL — ABNORMAL HIGH (ref 0.61–1.24)
GFR calc Af Amer: 50 mL/min — ABNORMAL LOW (ref >60)
GFR, EST NON AFRICAN AMERICAN: 43 mL/min — AB (ref >60)
GLUCOSE: 156 mg/dL — AB (ref 65–99)
POTASSIUM: 4.1 mmol/L (ref 3.5–5.1)
Sodium: 133 mmol/L — ABNORMAL LOW (ref 135–145)

## 2017-11-21 MED ORDER — BUDESONIDE 0.5 MG/2ML IN SUSP
0.5000 mg | Freq: Two times a day (BID) | RESPIRATORY_TRACT | Status: DC
  Administered 2017-11-21 – 2017-11-23 (×4): 0.5 mg via RESPIRATORY_TRACT
  Filled 2017-11-21 (×4): qty 2

## 2017-11-21 MED ORDER — CEFAZOLIN SODIUM-DEXTROSE 1-4 GM/50ML-% IV SOLN
1.0000 g | Freq: Three times a day (TID) | INTRAVENOUS | Status: DC
  Administered 2017-11-21 – 2017-11-23 (×7): 1 g via INTRAVENOUS
  Filled 2017-11-21 (×12): qty 50

## 2017-11-21 MED ORDER — ALBUTEROL SULFATE (2.5 MG/3ML) 0.083% IN NEBU
2.5000 mg | INHALATION_SOLUTION | Freq: Four times a day (QID) | RESPIRATORY_TRACT | Status: DC
  Administered 2017-11-21 – 2017-11-22 (×4): 2.5 mg via RESPIRATORY_TRACT
  Filled 2017-11-21 (×4): qty 3

## 2017-11-21 MED ORDER — FUROSEMIDE 10 MG/ML IJ SOLN
40.0000 mg | Freq: Once | INTRAMUSCULAR | Status: AC
  Administered 2017-11-21: 40 mg via INTRAVENOUS
  Filled 2017-11-21: qty 4

## 2017-11-21 MED ORDER — FUROSEMIDE 10 MG/ML IJ SOLN
80.0000 mg | Freq: Once | INTRAMUSCULAR | Status: AC
  Administered 2017-11-21: 80 mg via INTRAVENOUS
  Filled 2017-11-21: qty 8

## 2017-11-21 MED ORDER — METHYLPREDNISOLONE SODIUM SUCC 40 MG IJ SOLR
40.0000 mg | Freq: Every day | INTRAMUSCULAR | Status: DC
  Administered 2017-11-21 – 2017-11-23 (×3): 40 mg via INTRAVENOUS
  Filled 2017-11-21 (×3): qty 1

## 2017-11-21 NOTE — Plan of Care (Signed)
  Education: Knowledge of General Education information will improve 11/21/2017 [phone] - Progressing by Lorine Iannaccone, Lucille Passy, RN   Health Behavior/Discharge Planning: Ability to manage health-related needs will improve 11/21/2017 0333 - Progressing by Jenesis Suchy, Lucille Passy, RN   Clinical Measurements: Ability to maintain clinical measurements within normal limits will improve 11/21/2017 0333 - Progressing by Sruti Ayllon, Lucille Passy, RN Will remain free from infection 11/21/2017 [phone] - Progressing by Adalbert Alberto, Lucille Passy, RN Diagnostic test results will improve 11/21/2017 [phone] - Progressing by Mahamud Metts, Lucille Passy, RN Respiratory complications will improve 11/21/2017 0333 - Progressing by Bryna Colander, RN Cardiovascular complication will be avoided 11/21/2017 0333 - Progressing by Bryna Colander, RN   Activity: Risk for activity intolerance will decrease 11/21/2017 0333 - Progressing by Bryna Colander, RN   Nutrition: Adequate nutrition will be maintained 11/21/2017 0333 - Progressing by Bryna Colander, RN   Coping: Level of anxiety will decrease 11/21/2017 0333 - Progressing by Nawal Burling, Lucille Passy, RN

## 2017-11-21 NOTE — Consult Note (Signed)
Pharmacy Antibiotic Note  Darius Taylor is a 72 y.o. male admitted on 11/18/2017 with cellulitis.  Pharmacy has been consulted for Cefazolin dosing. Patient received 2 days of IV Unasyn and 1 dose of Augmentin.   Plan: Start Cefazolin 1g IV every 8 hours.   Height: 5\' 11"  (180.3 cm) Weight: 210 lb 8 oz (95.5 kg) IBW/kg (Calculated) : 75.3  Temp (24hrs), Avg:98.9 F (37.2 C), Min:98.2 F (36.8 C), Max:100 F (37.8 C)  Recent Labs  Lab 11/18/17 1722 11/18/17 1731 11/18/17 1919 11/19/17 0324 11/19/17 1049 11/20/17 0700 11/21/17 0307  WBC  --  11.0*  --  11.4*  --  9.1  --   CREATININE  --  1.63*  --  1.72*  --  1.68* 1.53*  LATICACIDVEN 2.4*  --  2.8*  --  1.1  --   --     Estimated Creatinine Clearance: 50.7 mL/min (A) (by C-G formula based on SCr of 1.53 mg/dL (H)).    No Known Allergies  Antimicrobials this admission: 2/25 Unasyn >> 2/27 2/27 Augmentin >> 2/28 2/28 cefazolin >>   Dose adjustments this admission:  Microbiology results:  2/26  MRSA PCR: negative   Thank you for allowing pharmacy to be a part of this patient's care.  Pernell Dupre, PharmD, BCPS Clinical Pharmacist 11/21/2017 8:13 AM

## 2017-11-21 NOTE — Progress Notes (Signed)
Patient ID: Darius Taylor, male   DOB: [birth year], 73 y.o.   MRN: [id number]  [employer] PROGRESS NOTE  Darius Taylor DJS:[id number] DOB: 1946/05/17 DOA: 11/18/2017 PCP: [employer]  HPI/Subjective: Left arm more swollen and red today.  As per nursing staff this started last night.  Patient was switched to oral antibiotics yesterday.  Patient having a little bit of shortness of breath and was placed on oxygen.  Objective: Vitals:   11/20/17 2027 11/21/17 0753  BP: 123/72 139/60  Pulse: 66 77  Resp:  18  Temp: 100 F (37.8 C) 98.6 F (37 C)  SpO2: 94% 93%    Filed Weights   11/18/17 1728 11/18/17 2247  Weight: 97.5 kg (215 lb) 95.5 kg (210 lb 8 oz)    ROS: Review of Systems  Constitutional: Negative for chills and fever.  Eyes: Negative for blurred vision.  Respiratory: Positive for cough and shortness of breath.   Cardiovascular: Negative for chest pain.  Gastrointestinal: Negative for abdominal pain, constipation, diarrhea, nausea and vomiting.  Genitourinary: Negative for dysuria.  Musculoskeletal: Positive for joint pain.  Neurological: Negative for dizziness and headaches.   Exam: Physical Exam  Constitutional: He is oriented to person, place, and time.  HENT:  Nose: No mucosal edema.  Mouth/Throat: No oropharyngeal exudate or posterior oropharyngeal edema.  Eyes: Conjunctivae, EOM and lids are normal. Pupils are equal, round, and reactive to light.  Neck: No JVD present. Carotid bruit is not present. No edema present. No thyroid mass and no thyromegaly present.  Cardiovascular: S1 normal and S2 normal. Exam reveals no gallop.  No murmur heard. Pulses:      Dorsalis pedis pulses are 2+ on the right side, and 2+ on the left side.  Respiratory: No respiratory distress. He has no wheezes. He has no rhonchi. He has no rales.  GI: Soft. Bowel sounds are normal. There is no tenderness.  Musculoskeletal:       Left elbow: He exhibits swelling.        Left wrist: He exhibits tenderness.       Right ankle: He exhibits no swelling.       Left ankle: He exhibits no swelling.  Lymphadenopathy:    He has no cervical adenopathy.  Neurological: He is alert and oriented to person, place, and time. No cranial nerve deficit.  Skin: Skin is warm. Nails show no clubbing.  Erythema left arm circumferential  Psychiatric: He has a normal mood and affect.      Data Reviewed: Basic Metabolic Panel: Recent Labs  Lab 11/18/17 1731 11/19/17 0324 11/20/17 0700 11/21/17 0307  NA 138 139 137 133*  K 3.8 3.5 3.8 4.1  CL 98* 101 103 100*  CO2 28 29 26 26   GLUCOSE 141* 123* 109* 156*  BUN 31* 32* 30* 29*  CREATININE 1.63* 1.72* 1.68* 1.53*  CALCIUM 9.5 9.0 8.2* 8.4*   Liver Function Tests: Recent Labs  Lab 11/18/17 1731  AST 28  ALT 17  ALKPHOS 74  BILITOT 0.9  PROT 9.1*  ALBUMIN 4.6   CBC: Recent Labs  Lab 11/18/17 1731 11/19/17 0324 11/20/17 0700  WBC 11.0* 11.4* 9.1  NEUTROABS 9.0*  --   --   HGB 13.0 12.0* 10.5*  HCT 38.8* 36.0* 31.6*  MCV 92.3 92.7 92.8  PLT 208 199 162   Cardiac Enzymes: Recent Labs  Lab 11/18/17 1731  CKTOTAL 63     Recent Results (from the past 240 hour(s))  Culture,  blood (Routine x 2)     Status: None (Preliminary result)   Collection Time: 11/18/17  7:19 PM  Result Value Ref Range Status   Specimen Description BLOOD RIGHT ANTECUBITAL  Final   Special Requests   Final    BOTTLES DRAWN AEROBIC AND ANAEROBIC Blood Culture adequate volume   Culture   Final    NO GROWTH 3 DAYS Performed at [facility], [address] [place], [place] [address]    Report Status PENDING  Incomplete  Culture, blood (Routine x 2)     Status: None (Preliminary result)   Collection Time: 11/18/17  7:19 PM  Result Value Ref Range Status   Specimen Description BLOOD BLOOD RIGHT FOREARM  Final   Special Requests   Final    BOTTLES DRAWN AEROBIC AND ANAEROBIC Blood Culture adequate volume    Culture   Final    NO GROWTH 3 DAYS Performed at [facility], [address] [place], [place] [address]    Report Status PENDING  Incomplete  MRSA PCR Screening     Status: None   Collection Time: 11/19/17  9:26 AM  Result Value Ref Range Status   MRSA by PCR NEGATIVE NEGATIVE Final    Comment:        The GeneXpert MRSA Assay (FDA approved for NASAL specimens only), is one component of a comprehensive MRSA colonization surveillance program. It is not intended to diagnose MRSA infection nor to guide or monitor treatment for MRSA infections. Performed at [facility], [address] [name], [place] [address]       Scheduled Meds: . amiodarone  200 mg Oral Daily  . amLODipine  10 mg Oral Daily  . apixaban  5 mg Oral BID  . aspirin  81 mg Oral Daily  . atorvastatin  80 mg Oral Daily  . cholecalciferol  1,000 Units Oral Daily  . furosemide  40 mg Intravenous Once  . magnesium oxide  400 mg Oral BID  . metoprolol  200 mg Oral Daily  . mometasone-formoterol  2 puff Inhalation BID  . spironolactone  12.5 mg Oral Daily   Continuous Infusions: .  ceFAZolin (ANCEF) IV      Assessment/Plan:  1. Worsening cellulitis.  Switch antibiotics to IV antibiotics today.  Reassess tomorrow.  Elevation of arm is helpful. 2. Fluid overload.  Stop IV fluids.  Give IV Lasix today.  Reassess tomorrow. 3. Acute kidney injury on chronic kidney disease stage III.  Stop IV fluids today.  1 dose of IV Lasix and monitor.  Creatinine improved to 1.53.  Baseline around 1.2 to 4. Atrial fibrillation.  On Eliquis for anticoagulation.  Amiodarone and metoprolol for rate control. 5. Essential hypertension on amlodipine and metoprolol and Spironolactone 6. COPD.  Respiratory status stable 7. History of stroke on aspirin and Eliquis.  Code Status:     Code Status Orders  (From admission, onward)        Start     Ordered   11/18/17 2228  Full code  Continuous     11/18/17  2227    Code Status History    Date Active Date Inactive Code Status Order ID Comments User Context   10/21/2016 12:16 10/26/2016 22:47 Full Code [id number]  Bettey Costa, MD Inpatient      Disposition Plan: Arm will have to be less swollen and red prior to disposition  Antibiotics:  Change to IV Ancef today  Time spent: 27 minutes  Vearl Aitken [employer]  [employer]

## 2017-11-22 LAB — BASIC METABOLIC PANEL
ANION GAP: 12 (ref 5–15)
BUN: 31 mg/dL — ABNORMAL HIGH (ref 6–20)
CHLORIDE: 102 mmol/L (ref 101–111)
CO2: 23 mmol/L (ref 22–32)
Calcium: 8.8 mg/dL — ABNORMAL LOW (ref 8.9–10.3)
Creatinine, Ser: 1.32 mg/dL — ABNORMAL HIGH (ref 0.61–1.24)
GFR calc Af Amer: >60 mL/min (ref >60)
GFR, EST NON AFRICAN AMERICAN: 52 mL/min — AB (ref >60)
GLUCOSE: 152 mg/dL — AB (ref 65–99)
POTASSIUM: 4.6 mmol/L (ref 3.5–5.1)
Sodium: 137 mmol/L (ref 135–145)

## 2017-11-22 LAB — SEDIMENTATION RATE: Sed Rate: >140 mm/hr — ABNORMAL HIGH (ref 0–20)

## 2017-11-22 MED ORDER — ALBUTEROL SULFATE (2.5 MG/3ML) 0.083% IN NEBU
2.5000 mg | INHALATION_SOLUTION | Freq: Three times a day (TID) | RESPIRATORY_TRACT | Status: DC
  Administered 2017-11-22 – 2017-11-23 (×3): 2.5 mg via RESPIRATORY_TRACT
  Filled 2017-11-22 (×3): qty 3

## 2017-11-22 NOTE — Consult Note (Signed)
Pharmacy Antibiotic Note  Darius Taylor is a 72 y.o. male admitted on 11/18/2017 with cellulitis.  Pharmacy has been consulted for Cefazolin dosing. Patient received 2 days of IV Unasyn and 1 dose of Augmentin.   This is day #4 of antibiotics.  Plan: Continue cefazolin 1g IV every 8 hours  Height: 5\' 11"  (180.3 cm) Weight: 210 lb 8 oz (95.5 kg) IBW/kg (Calculated) : 75.3  Temp (24hrs), Avg:98.5 F (36.9 C), Min:97.9 F (36.6 C), Max:99.1 F (37.3 C)  Recent Labs  Lab 11/18/17 1722 11/18/17 1731 11/18/17 1919 11/19/17 0324 11/19/17 1049 11/20/17 0700 11/21/17 0307 11/22/17 0616  WBC  --  11.0*  --  11.4*  --  9.1  --   --   CREATININE  --  1.63*  --  1.72*  --  1.68* 1.53* 1.32*  LATICACIDVEN 2.4*  --  2.8*  --  1.1  --   --   --     Estimated Creatinine Clearance: 58.8 mL/min (A) (by C-G formula based on SCr of 1.32 mg/dL (H)).    No Known Allergies  Antimicrobials this admission: 2/25 Unasyn >> 2/27 2/27 Augmentin >> 2/28 2/28 cefazolin >>   Dose adjustments this admission:  Microbiology results:  2/26  MRSA PCR: negative   Thank you for allowing pharmacy to be a part of this patient's care.  Lenis Noon, PharmD, BCPS Clinical Pharmacist 11/22/2017 9:34 AM

## 2017-11-22 NOTE — Progress Notes (Signed)
Pt sats on roomair 90%, in no distress, pt states he fills fine and said he does not want to put oxygen back in nose. Will leave nasal cannula on at 2lpm [place]. Will continue to monitor.

## 2017-11-22 NOTE — Progress Notes (Signed)
Patient ID: Darius Taylor, male   DOB: Sep 16, 1946, 72 y.o.   MRN: [id number]  [employer] PROGRESS NOTE  Stella Encarnacion [place] ERD:[id number] DOB: 1945-10-30 DOA: 11/18/2017 PCP: [employer]  HPI/Subjective: left arm is less red.  No further shortness of breath today.  Objective: Vitals:   11/22/17 0308 11/22/17 0846  BP: 134/82 120/72  Pulse: 65 69  Resp: (!) 22   Temp: 97.9 F (36.6 C)   SpO2: 93% 91%    Filed Weights   11/18/17 1728 11/18/17 2247  Weight: 97.5 kg (215 lb) 95.5 kg (210 lb 8 oz)    ROS: Review of Systems  Constitutional: Negative for chills and fever.  Eyes: Negative for blurred vision.  Respiratory: Positive for cough. Negative for shortness of breath.   Cardiovascular: Negative for chest pain.  Gastrointestinal: Negative for abdominal pain, constipation, diarrhea, nausea and vomiting.  Genitourinary: Negative for dysuria.  Musculoskeletal: Positive for joint pain.  Neurological: Negative for dizziness and headaches.   Exam: Physical Exam  Constitutional: He is oriented to person, place, and time.  HENT:  Nose: No mucosal edema.  Mouth/Throat: No oropharyngeal exudate or posterior oropharyngeal edema.  Eyes: Conjunctivae, EOM and lids are normal. Pupils are equal, round, and reactive to light.  Neck: No JVD present. Carotid bruit is not present. No edema present. No thyroid mass and no thyromegaly present.  Cardiovascular: S1 normal and S2 normal. Exam reveals no gallop.  No murmur heard. Pulses:      Dorsalis pedis pulses are 2+ on the right side, and 2+ on the left side.  Respiratory: No respiratory distress. He has no wheezes. He has no rhonchi. He has no rales.  GI: Soft. Bowel sounds are normal. There is no tenderness.  Musculoskeletal:       Left elbow: He exhibits swelling.       Left wrist: He exhibits tenderness.       Right ankle: He exhibits no swelling.       Left ankle: He exhibits no swelling.  Lymphadenopathy:    He has no cervical adenopathy.  Neurological: He is alert and oriented to person, place, and time. No cranial nerve deficit.  Skin: Skin is warm. Nails show no clubbing.  Erythema left arm circumferential  Psychiatric: He has a normal mood and affect.      Data Reviewed: Basic Metabolic Panel: Recent Labs  Lab 11/18/17 1731 11/19/17 0324 11/20/17 0700 11/21/17 0307 11/22/17 0616  NA 138 139 137 133* 137  K 3.8 3.5 3.8 4.1 4.6  CL 98* 101 103 100* 102  CO2 28 29 26 26 23   GLUCOSE 141* 123* 109* 156* 152*  BUN 31* 32* 30* 29* 31*  CREATININE 1.63* 1.72* 1.68* 1.53* 1.32*  CALCIUM 9.5 9.0 8.2* 8.4* 8.8*   Liver Function Tests: Recent Labs  Lab 11/18/17 1731  AST 28  ALT 17  ALKPHOS 74  BILITOT 0.9  PROT 9.1*  ALBUMIN 4.6   CBC: Recent Labs  Lab 11/18/17 1731 11/19/17 0324 11/20/17 0700  WBC 11.0* 11.4* 9.1  NEUTROABS 9.0*  --   --   HGB 13.0 12.0* 10.5*  HCT 38.8* 36.0* 31.6*  MCV 92.3 92.7 92.8  PLT 208 199 162   Cardiac Enzymes: Recent Labs  Lab 11/18/17 1731  CKTOTAL 63     Recent Results (from the past 240 hour(s))  Culture, blood (Routine x 2)     Status: None (Preliminary result)   Collection Time: 11/18/17  7:19  PM  Result Value Ref Range Status   Specimen Description BLOOD RIGHT ANTECUBITAL  Final   Special Requests   Final    BOTTLES DRAWN AEROBIC AND ANAEROBIC Blood Culture adequate volume   Culture   Final    NO GROWTH 4 DAYS Performed at [facility], [address] [place], [place] [address]    Report Status PENDING  Incomplete  Culture, blood (Routine x 2)     Status: None (Preliminary result)   Collection Time: 11/18/17  7:19 PM  Result Value Ref Range Status   Specimen Description BLOOD BLOOD RIGHT FOREARM  Final   Special Requests   Final    BOTTLES DRAWN AEROBIC AND ANAEROBIC Blood Culture adequate volume   Culture   Final    NO GROWTH 4 DAYS Performed at [facility], [address] [place],  [place] [address]    Report Status PENDING  Incomplete  MRSA PCR Screening     Status: None   Collection Time: 11/19/17  9:26 AM  Result Value Ref Range Status   MRSA by PCR NEGATIVE NEGATIVE Final    Comment:        The GeneXpert MRSA Assay (FDA approved for NASAL specimens only), is one component of a comprehensive MRSA colonization surveillance program. It is not intended to diagnose MRSA infection nor to guide or monitor treatment for MRSA infections. Performed at [facility], [address] [place], [place] [address]       Scheduled Meds: . albuterol  2.5 mg Nebulization Q6H  . amiodarone  200 mg Oral Daily  . amLODipine  10 mg Oral Daily  . apixaban  5 mg Oral BID  . aspirin  81 mg Oral Daily  . atorvastatin  80 mg Oral Daily  . budesonide (PULMICORT) nebulizer solution  0.5 mg Nebulization BID  . cholecalciferol  1,000 Units Oral Daily  . magnesium oxide  400 mg Oral BID  . methylPREDNISolone (SOLU-MEDROL) injection  40 mg Intravenous Daily  . metoprolol  200 mg Oral Daily  . mometasone-formoterol  2 puff Inhalation BID  . spironolactone  12.5 mg Oral Daily   Continuous Infusions: .  ceFAZolin (ANCEF) IV 1 g (11/22/17 0851)    Assessment/Plan:  1. Left arm cellulitis: Clinically improving slowly.  Continue IV antibiotics for 1 more day, continue elevation of the left arm.   2. Fluid overload.  Stopped IV fluids, received IV Lasix yesterday, he feels little better today.  Wean off oxygen as tolerated.  Patient has chronic oxygen 2 L at home.   3. Acute kidney injury on chronic kidney disease stage III.  Improved, and 1.32.  4.  Atrial fibrillation.  On Eliquis for anticoagulation.  Amiodarone and metoprolol for rate control. 5. Essential hypertension on amlodipine and metoprolol and Spironolactone 6. COPD.  Respiratory status stable 7. History of stroke on aspirin and Eliquis. #8 physical therapy consult for deconditioning. 9.  Likely discharge home  tomorrow if stable. Code Status:     Code Status Orders  (From admission, onward)        Start     Ordered   11/18/17 2228  Full code  Continuous     11/18/17 2227    Code Status History    Date Active Date Inactive Code Status Order ID Comments User Context   10/21/2016 12:16 10/26/2016 22:47 Full Code [id number]  Bettey Costa, MD Inpatient      Disposition Plan: Arm will have to be less swollen  and red prior to disposition  Antibiotics:  Change to IV Ancef today  Time spent: 27 minutes  [place]

## 2017-11-23 LAB — CBC
HEMATOCRIT: 29.6 % — AB (ref 40.0–52.0)
HEMOGLOBIN: 10.2 g/dL — AB (ref 13.0–18.0)
MCH: 31.5 pg (ref 26.0–34.0)
MCHC: 34.5 g/dL (ref 32.0–36.0)
MCV: 91.3 fL (ref 80.0–100.0)
Platelets: 198 10*3/uL (ref 150–440)
RBC: 3.24 MIL/uL — ABNORMAL LOW (ref 4.40–5.90)
RDW: 13.8 % (ref 11.5–14.5)
WBC: 12.8 10*3/uL — ABNORMAL HIGH (ref 3.8–10.6)

## 2017-11-23 LAB — BASIC METABOLIC PANEL
ANION GAP: 11 (ref 5–15)
BUN: 44 mg/dL — ABNORMAL HIGH (ref 6–20)
CO2: 25 mmol/L (ref 22–32)
Calcium: 9.1 mg/dL (ref 8.9–10.3)
Chloride: 99 mmol/L — ABNORMAL LOW (ref 101–111)
Creatinine, Ser: 1.54 mg/dL — ABNORMAL HIGH (ref 0.61–1.24)
GFR calc Af Amer: 50 mL/min — ABNORMAL LOW (ref >60)
GFR, EST NON AFRICAN AMERICAN: 43 mL/min — AB (ref >60)
GLUCOSE: 141 mg/dL — AB (ref 65–99)
Potassium: 5 mmol/L (ref 3.5–5.1)
Sodium: 135 mmol/L (ref 135–145)

## 2017-11-23 LAB — CULTURE, BLOOD (ROUTINE X 2)
CULTURE: NO GROWTH
Culture: NO GROWTH
SPECIAL REQUESTS: ADEQUATE
SPECIAL REQUESTS: ADEQUATE

## 2017-11-23 MED ORDER — AMOXICILLIN-POT CLAVULANATE 875-125 MG PO TABS: 1.0000 | ORAL_TABLET | Freq: Two times a day (BID) | ORAL | 0 refills | Status: DC

## 2017-11-23 MED ORDER — OXYCODONE HCL 5 MG PO TABS: 5.0000 mg | ORAL_TABLET | Freq: Four times a day (QID) | ORAL | 0 refills | Status: DC | PRN

## 2017-11-23 MED ORDER — AMOXICILLIN-POT CLAVULANATE 875-125 MG PO TABS: 1.0000 | ORAL_TABLET | Freq: Two times a day (BID) | ORAL | 0 refills | Status: AC

## 2017-11-23 MED ORDER — PREDNISONE 10 MG (21) PO TBPK: ORAL_TABLET | ORAL | 0 refills | Status: DC

## 2017-11-23 NOTE — Progress Notes (Signed)
Home oxygen was delivered to pt room and pt is refusing home oxygen and refusing to sign for it. RN educated pt on the need for oxygen. Pt is still adamant that he does not need oxygen and does not want to "pay a co-pay" for it. CM aware, MD called.

## 2017-11-23 NOTE — Progress Notes (Signed)
Stable for discharge home today.  Has left arm cellulitis is improving.  Can continue Augmentin for 2 weeks.  Patient can follow-up with PCP next week he already has appointment next week with his PCP and advised him to continue to follow with her.  Advised the patient to continue his heart medicines.  Does have CKD stage III as per medical records but patient says he does not even know he has a kidney problem. The patient sats 90% on room air and he already has oxygen at home and he says he does not use it often.  Check ambulatory oxygen saturation documented and see if he needs to be on oxygen when he walks. Time spent 30 minutes

## 2017-11-23 NOTE — Care Management Note (Signed)
Case Management Note  Patient Details  Name: Darius Taylor MRN: [id number] Date of Birth: 1945-11-12  Subjective/Objective:     [name] qualifies for new home 02. Chronic COPD . [name] at [facility] was updated and will deliver a portable tank and new home 02 set up.                Action/Plan:   Expected Discharge Date:  11/23/17               Expected Discharge Plan:     In-House Referral:     Discharge planning Services     Post Acute Care Choice:    Choice offered to:     DME Arranged:    DME Agency:     HH Arranged:    HH Agency:     Status of Service:     If discussed at [employer], dates discussed:    Additional Comments:  Cataldo Cosgriff A, RN 11/23/2017, 10:30 AM

## 2017-11-23 NOTE — Progress Notes (Signed)
Pt changed his mind and is taking the home oxygen. RN educated pt on how to use his new home oxygen using the teach back method. Pt demonstrated understanding. RN explained discharge instructions to pt and pt verbalized understanding. VSS. Pt wheeled to visitors entrance and assisted into family members vehicle.

## 2017-11-23 NOTE — Progress Notes (Signed)
SATURATION QUALIFICATIONS: (This note is used to comply with regulatory documentation for home oxygen)  Patient Saturations on Room Air at Rest = 89%  Patient Saturations on Room Air while Ambulating = 78%  Patient Saturations on 2 Liters of oxygen while Ambulating = 93%  Please briefly explain why patient needs home oxygen:  Oxygen saturation drops to inadequate levels when patient is not receiving oxygen.

## 2017-11-27 NOTE — Discharge Summary (Signed)
Darius Taylor, is a 72 y.o. male  DOB 05-03-1946  MRN [id number].  Admission date:  11/18/2017  Admitting Physician  Vaughan Basta, MD  Discharge Date:  11/23/2017   Primary [place]  Recommendations for primary care physician for things to follow:  follow with PCP in 1 week   Admission Diagnosis  Cellulitis of left upper extremity [L03.114] Sepsis, due to unspecified organism [facility]) [A41.9]   Discharge Diagnosis  Cellulitis of left upper extremity [L03.114] Sepsis, due to unspecified organism [facility]) [A41.9]   Principal Problem:   Cellulitis      Past Medical History:  Diagnosis Date  . Aortic stenosis   . Asthma   . Atrial fibrillation ([place])   . CAD (coronary artery disease)   . Cancer [facility])    colon, bladder and kidney  . COPD (chronic obstructive pulmonary disease) ([place])   . Hypertension     Past Surgical History:  Procedure Laterality Date  . CARDIAC DEFIBRILLATOR PLACEMENT    . CORONARY ANGIOPLASTY  09/25/2016  . IRRIGATION AND DEBRIDEMENT HEMATOMA Left 10/24/2016   Procedure: IRRIGATION AND DEBRIDEMENT HEMATOMA;  Surgeon: Florene Glen, MD;  Location: [facility] ORS;  Service: General;  Laterality: Left;  . KIDNEY SURGERY     left removed       History of present illness and  Hospital Course:     Kindly see H&P for history of present illness and admission details, please review complete Labs, Consult reports and Test reports for all details in brief  HPI  from the history and physical done on the day of admission 72 year old male with history of aortic stenosis, atrial fibrillation, CAD, colon cancer, COPD noted to have left elbow swelling with pain, redness which got progressively worse so he is admitted for left earm cellulitis.   Hospital Course  1.  Left arm  cellulitis, patient required IV antibiotics Unasyn for 6 days.  Ultrasound of the left arm did not show DVT.  Patient showed improvement of his cellulitis at the time of discharge with decreased redness, swelling, discharge home with Augmentin for 14 days. 2.  Atrial fibrillation: Rate controlled, patient on metoprolol, amiodarone, Eliquis.  Advised the patient to continue them.  Patient can follow-up with his PCP, with cardiologist at [facility].  Patient has history of aortic stenosis, transcatheter aortic valve replacement at [facility].  Patient also has AICD.  Has history of chronic combined systolic, diastolic heart failure.  #3 history of COPD, without wheezing, patient qualified for home oxygen but he refused oxygen by home health.  Can continue albuterol, Symbicort. 4.  Acute on CKD stage III' patient received IV fluids during the hospital stay for cellulitis, patient developed shortness of breath requiring IV Lasix and discontinuation of fluids.  Can resume Lasix at discharge.  Discharge Condition: Stable   Follow UP With primary cardiologist at [facility] as scheduled.  And follow-up with [facility] services in 1 week     Discharge Instructions  and  Discharge Medications      Allergies as of 11/23/2017   No Known Allergies     Medication List    TAKE these medications   acetaminophen 325 MG tablet Commonly known as:  TYLENOL Take 325 mg by mouth every 6 (six) hours as needed.   albuterol (2.5 MG/3ML) 0.083% nebulizer solution Commonly known as:  PROVENTIL Take 2.5 mg by nebulization every 6 (six) hours as needed for wheezing or shortness of breath.   amiodarone 200  MG tablet Commonly known as:  PACERONE Take 200 mg by mouth daily.   amLODipine 10 MG tablet Commonly known as:  NORVASC Take 10 mg by mouth daily.   amoxicillin-clavulanate 875-125 MG tablet Commonly known as:  AUGMENTIN Take 1 tablet by mouth 2 (two) times daily for 14 days.   aspirin 81 MG chewable tablet Chew  81 mg by mouth daily.   atorvastatin 80 MG tablet Commonly known as:  LIPITOR Take 80 mg by mouth daily.   budesonide-formoterol 160-4.5 MCG/ACT inhaler Commonly known as:  SYMBICORT Inhale 2 puffs into the lungs 2 (two) times daily.   cholecalciferol 1000 units tablet Commonly known as:  VITAMIN D Take 1,000 Units by mouth daily.   docusate sodium 100 MG capsule Commonly known as:  COLACE Take 200 mg by mouth 2 (two) times daily as needed for mild constipation.   ELIQUIS 5 MG Tabs tablet Generic drug:  apixaban Take 5 mg by mouth 2 (two) times daily.   furosemide 40 MG tablet Commonly known as:  LASIX Take 40 mg by mouth 2 (two) times daily.   guaiFENesin 100 MG/5ML Soln Commonly known as:  ROBITUSSIN Take 5 mLs (100 mg total) by mouth every 4 (four) hours as needed for cough or to loosen phlegm.   magnesium oxide 400 MG tablet Commonly known as:  MAG-OX Take 400 mg by mouth 2 (two) times daily.   metoprolol 200 MG 24 hr tablet Commonly known as:  TOPROL-XL Take 200 mg by mouth daily.   nitroGLYCERIN 0.4 MG SL tablet Commonly known as:  NITROSTAT Place 0.4 mg under the tongue every 5 (five) minutes as needed for chest pain.   oxyCODONE 5 MG immediate release tablet Commonly known as:  Oxy IR/ROXICODONE Take 1 tablet (5 mg total) by mouth every 6 (six) hours as needed for moderate pain, severe pain or breakthrough pain.   predniSONE 10 MG (21) Tbpk tablet Commonly known as:  STERAPRED UNI-PAK 21 TAB Taper by 10 mg daily What changed:  additional instructions   spironolactone 25 MG tablet Commonly known as:  ALDACTONE Take 12.5 mg by mouth daily.         Diet and Activity recommendation: See Discharge Instructions above   Consults obtained - PT   Major procedures and Radiology Reports - PLEASE review detailed and final reports for all details, in brief -      Dg Chest 2 View  Result Date: 11/18/2017 CLINICAL DATA:  Shortness of breath EXAM:  CHEST  2 VIEW COMPARISON:  October 26, 2016 FINDINGS: The heart size and mediastinal contours are stable. The heart size is enlarged. Cardiac pacemaker is unchanged. Both lungs are clear. The visualized skeletal structures are unremarkable. IMPRESSION: Cardiomegaly.  No pulmonary edema or focal pneumonia. Electronically Signed   By: Abelardo Darius M.D.   On: 11/18/2017 19:16   Ct Head Wo Contrast  Result Date: 10/30/2017 CLINICAL DATA:  Left-sided blurry vision for several hours EXAM: CT HEAD WITHOUT CONTRAST TECHNIQUE: Contiguous axial images were obtained from the base of the skull through the vertex without intravenous contrast. COMPARISON:  None. FINDINGS: Brain: Mild atrophic changes are noted. No findings to suggest acute hemorrhage, acute infarction or space-occupying mass lesion are seen. Vascular: No hyperdense vessel or unexpected calcification. Skull: Normal. Negative for fracture or focal lesion. Sinuses/Orbits: No acute finding. Other: None. IMPRESSION: Mild atrophic changes without acute intracranial abnormality. Electronically Signed   By: Inez Catalina M.D.   On: 10/30/2017 14:29  US Venous Img Upper Uni Left  Result Date: 11/18/2017 CLINICAL DATA:  Left upper extremity swelling and pain. EXAM: Left UPPER EXTREMITY VENOUS DOPPLER ULTRASOUND TECHNIQUE: Gray-scale sonography with graded compression, as well as color Doppler and duplex ultrasound were performed to evaluate the upper extremity deep venous system from the level of the subclavian vein and including the jugular, axillary, basilic, radial, ulnar and upper cephalic vein. Spectral Doppler was utilized to evaluate flow at rest and with distal augmentation maneuvers. COMPARISON:  None. FINDINGS: Contralateral Subclavian Vein: Respiratory phasicity is normal and symmetric with the symptomatic side. No evidence of thrombus. Normal compressibility. Internal Jugular Vein: No evidence of thrombus. Normal compressibility, respiratory phasicity  and response to augmentation. Subclavian Vein: No evidence of thrombus. Normal compressibility, respiratory phasicity and response to augmentation. Axillary Vein: No evidence of thrombus. Normal compressibility, respiratory phasicity and response to augmentation. Cephalic Vein: No evidence of thrombus. Normal compressibility, respiratory phasicity and response to augmentation. Basilic Vein: No evidence of thrombus. Normal compressibility, respiratory phasicity and response to augmentation. Brachial Veins: No evidence of thrombus. Normal compressibility, respiratory phasicity and response to augmentation. Radial Veins: No evidence of thrombus. Normal compressibility, respiratory phasicity and response to augmentation. Ulnar Veins: No evidence of thrombus. Normal compressibility, respiratory phasicity and response to augmentation. Venous Reflux:  None visualized. Other Findings: Significant amount of soft tissue edema is seen in the dorsal soft tissues of the forearm. IMPRESSION: No evidence of DVT within the left upper extremity. Significant amount of soft tissue edema seen in dorsal left forearm. Electronically Signed   By: Marijo Conception, M.D.   On: 11/18/2017 18:50   Dg Chest Port 1 View  Result Date: 11/21/2017 CLINICAL DATA:  Shortness of breath and cough. EXAM: PORTABLE CHEST 1 VIEW COMPARISON:  11/18/2017 FINDINGS: Left-sided pacemaker unchanged. Lungs are adequately inflated with subtle hazy prominence of the perihilar markings suggesting mild vascular congestion. No effusion. Stable moderate cardiomegaly. Remainder of the exam is unchanged. IMPRESSION: Moderate stable cardiomegaly with suggestion of mild vascular congestion. Electronically Signed   By: Marin Olp M.D.   On: 11/21/2017 17:18    Micro Results    Recent Results (from the past 240 hour(s))  Culture, blood (Routine x 2)     Status: None   Collection Time: 11/18/17  7:19 PM  Result Value Ref Range Status   Specimen Description  BLOOD RIGHT ANTECUBITAL  Final   Special Requests   Final    BOTTLES DRAWN AEROBIC AND ANAEROBIC Blood Culture adequate volume   Culture   Final    NO GROWTH 5 DAYS Performed at [facility], [address] [place], [place] [address]    Report Status 11/23/2017 FINAL  Final  Culture, blood (Routine x 2)     Status: None   Collection Time: 11/18/17  7:19 PM  Result Value Ref Range Status   Specimen Description BLOOD BLOOD RIGHT FOREARM  Final   Special Requests   Final    BOTTLES DRAWN AEROBIC AND ANAEROBIC Blood Culture adequate volume   Culture   Final    NO GROWTH 5 DAYS Performed at [facility], [address], [place], [place] [address]    Report Status 11/23/2017 FINAL  Final  MRSA PCR Screening     Status: None   Collection Time: 11/19/17  9:26 AM  Result Value Ref Range Status   MRSA by PCR NEGATIVE NEGATIVE Final    Comment:        The GeneXpert MRSA  Assay (FDA approved for NASAL specimens only), is one component of a comprehensive MRSA colonization surveillance program. It is not intended to diagnose MRSA infection nor to guide or monitor treatment for MRSA infections. Performed at [facility], [address] [place], [place] [address]        Today   Subjective:   Darius Taylor today has no headache,no chest abdominal pain,no new weakness tingling or numbness, feels much better wants to go home today.   Objective:   Blood pressure (!) 144/89, pulse 72, temperature 98.7 F (37.1 C), temperature source Oral, resp. rate 20, height 5\' 11"  (1.803 m), weight 95.5 kg (210 lb 8 oz), SpO2 90 %.  No intake or output data in the 24 hours ending 11/27/17 1022  Exam Awake Alert, Oriented x 3, No new F.N deficits, Normal affect .AT,PERRAL Supple Neck,No JVD, No cervical lymphadenopathy appriciated.  Symmetrical Chest wall movement, Good air movement bilaterally, CTAB RRR,No Gallops,Rubs or new Murmurs, No Parasternal Heave +ve  B.Sounds, Abd Soft, Non tender, No organomegaly appriciated, No rebound -guarding or rigidity. No Cyanosis, Clubbing or edema, No new Rash or bruise  Data Review   CBC w Diff:  Lab Results  Component Value Date   WBC 12.8 (H) 11/23/2017   HGB 10.2 (L) 11/23/2017   HGB 11.0 (L) 10/20/2014   HCT 29.6 (L) 11/23/2017   HCT 34.8 (L) 10/20/2014   PLT 198 11/23/2017   PLT 216 10/20/2014   LYMPHOPCT 7 11/18/2017   LYMPHOPCT 5.6 10/20/2014   MONOPCT 10 11/18/2017   MONOPCT 0.9 10/20/2014   EOSPCT 1 11/18/2017   EOSPCT 0.0 10/20/2014   BASOPCT 1 11/18/2017   BASOPCT 0.5 10/20/2014    CMP:  Lab Results  Component Value Date   NA 135 11/23/2017   NA 143 10/20/2014   K 5.0 11/23/2017   K 3.6 10/20/2014   CL 99 (L) 11/23/2017   CL 103 10/20/2014   CO2 25 11/23/2017   CO2 33 (H) 10/20/2014   BUN 44 (H) 11/23/2017   BUN 20 (H) 10/20/2014   CREATININE 1.54 (H) 11/23/2017   CREATININE 1.07 10/20/2014   PROT 9.1 (H) 11/18/2017   PROT 7.8 01/19/2013   ALBUMIN 4.6 11/18/2017   ALBUMIN 3.6 01/19/2013   BILITOT 0.9 11/18/2017   BILITOT 0.4 01/19/2013   ALKPHOS 74 11/18/2017   ALKPHOS 80 01/19/2013   AST 28 11/18/2017   AST 19 01/19/2013   ALT 17 11/18/2017   ALT 19 01/19/2013  .   Total Time in preparing paper work, data evaluation and todays exam - 35 minutes  Epifanio Lesches M.D on 11/23/2017 at 10:22 AM    Note: This dictation was prepared with Dragon dictation along with smaller phrase technology. Any transcriptional errors that result from this process are unintentional.

## 2018-01-02 ENCOUNTER — Other Ambulatory Visit: Payer: Self-pay | Admitting: Family Medicine

## 2018-01-02 ENCOUNTER — Encounter: Payer: Self-pay | Admitting: Emergency Medicine

## 2018-01-02 ENCOUNTER — Ambulatory Visit
Admission: RE | Admit: 2018-01-02 | Discharge: 2018-01-02 | Disposition: A | Discharge status: Home / Self Care | Payer: Medicare HMO | Source: Ambulatory Visit | Attending: Family Medicine | Admitting: Family Medicine

## 2018-01-02 DIAGNOSIS — M7989 Other specified soft tissue disorders: Secondary | ICD-10-CM | POA: Diagnosis not present

## 2018-01-02 DIAGNOSIS — L03818 Cellulitis of other sites: Secondary | ICD-10-CM

## 2018-01-02 DIAGNOSIS — L03114 Cellulitis of left upper limb: Secondary | ICD-10-CM | POA: Diagnosis present

## 2018-01-02 DIAGNOSIS — L0390 Cellulitis, unspecified: Secondary | ICD-10-CM

## 2018-01-02 DIAGNOSIS — R609 Edema, unspecified: Secondary | ICD-10-CM

## 2018-01-02 NOTE — ED Triage Notes (Signed)
Pt reports left foot 4th digit swelling for the last couple of weeks. Pt also reports left arm swelling below elbow. Pt reports MD advised him to come here for x'rays

## 2018-01-08 ENCOUNTER — Inpatient Hospital Stay
Admission: EM | Admit: 2018-01-08 | Discharge: 2018-01-11 | DRG: 190 | Disposition: A | Discharge status: Home / Self Care | Payer: Medicare Other | Attending: Internal Medicine | Admitting: Internal Medicine

## 2018-01-08 ENCOUNTER — Emergency Department: Payer: Medicare Other

## 2018-01-08 ENCOUNTER — Encounter: Payer: Self-pay | Admitting: Emergency Medicine

## 2018-01-08 ENCOUNTER — Other Ambulatory Visit: Payer: Self-pay

## 2018-01-08 DIAGNOSIS — M109 Gout, unspecified: Secondary | ICD-10-CM | POA: Diagnosis not present

## 2018-01-08 DIAGNOSIS — J441 Chronic obstructive pulmonary disease with (acute) exacerbation: Secondary | ICD-10-CM | POA: Diagnosis present

## 2018-01-08 DIAGNOSIS — Z8249 Family history of ischemic heart disease and other diseases of the circulatory system: Secondary | ICD-10-CM | POA: Diagnosis not present

## 2018-01-08 DIAGNOSIS — Z87891 Personal history of nicotine dependence: Secondary | ICD-10-CM | POA: Diagnosis not present

## 2018-01-08 DIAGNOSIS — N179 Acute kidney failure, unspecified: Secondary | ICD-10-CM | POA: Diagnosis not present

## 2018-01-08 DIAGNOSIS — I482 Chronic atrial fibrillation: Secondary | ICD-10-CM | POA: Diagnosis present

## 2018-01-08 DIAGNOSIS — I350 Nonrheumatic aortic (valve) stenosis: Secondary | ICD-10-CM | POA: Diagnosis present

## 2018-01-08 DIAGNOSIS — Z9861 Coronary angioplasty status: Secondary | ICD-10-CM

## 2018-01-08 DIAGNOSIS — Z79899 Other long term (current) drug therapy: Secondary | ICD-10-CM

## 2018-01-08 DIAGNOSIS — I5022 Chronic systolic (congestive) heart failure: Secondary | ICD-10-CM | POA: Diagnosis present

## 2018-01-08 DIAGNOSIS — I110 Hypertensive heart disease with heart failure: Secondary | ICD-10-CM | POA: Diagnosis present

## 2018-01-08 DIAGNOSIS — Z7982 Long term (current) use of aspirin: Secondary | ICD-10-CM

## 2018-01-08 DIAGNOSIS — I2510 Atherosclerotic heart disease of native coronary artery without angina pectoris: Secondary | ICD-10-CM | POA: Diagnosis present

## 2018-01-08 DIAGNOSIS — J9601 Acute respiratory failure with hypoxia: Secondary | ICD-10-CM | POA: Diagnosis present

## 2018-01-08 DIAGNOSIS — Z85038 Personal history of other malignant neoplasm of large intestine: Secondary | ICD-10-CM | POA: Diagnosis not present

## 2018-01-08 DIAGNOSIS — Z905 Acquired absence of kidney: Secondary | ICD-10-CM

## 2018-01-08 DIAGNOSIS — Z95810 Presence of automatic (implantable) cardiac defibrillator: Secondary | ICD-10-CM

## 2018-01-08 DIAGNOSIS — Z7901 Long term (current) use of anticoagulants: Secondary | ICD-10-CM | POA: Diagnosis not present

## 2018-01-08 DIAGNOSIS — Z85528 Personal history of other malignant neoplasm of kidney: Secondary | ICD-10-CM

## 2018-01-08 DIAGNOSIS — J449 Chronic obstructive pulmonary disease, unspecified: Secondary | ICD-10-CM

## 2018-01-08 DIAGNOSIS — Z8551 Personal history of malignant neoplasm of bladder: Secondary | ICD-10-CM

## 2018-01-08 LAB — BASIC METABOLIC PANEL
Anion gap: 7 (ref 5–15)
BUN: 31 mg/dL — ABNORMAL HIGH (ref 6–20)
CALCIUM: 9.3 mg/dL (ref 8.9–10.3)
CO2: 30 mmol/L (ref 22–32)
Chloride: 100 mmol/L — ABNORMAL LOW (ref 101–111)
Creatinine, Ser: 1.83 mg/dL — ABNORMAL HIGH (ref 0.61–1.24)
GFR, EST AFRICAN AMERICAN: 41 mL/min — AB (ref >60)
GFR, EST NON AFRICAN AMERICAN: 35 mL/min — AB (ref >60)
Glucose, Bld: 124 mg/dL — ABNORMAL HIGH (ref 65–99)
POTASSIUM: 4.6 mmol/L (ref 3.5–5.1)
Sodium: 137 mmol/L (ref 135–145)

## 2018-01-08 LAB — CBC
HEMATOCRIT: 39.4 % — AB (ref 40.0–52.0)
Hemoglobin: 13.1 g/dL (ref 13.0–18.0)
MCH: 30.3 pg (ref 26.0–34.0)
MCHC: 33.3 g/dL (ref 32.0–36.0)
MCV: 90.9 fL (ref 80.0–100.0)
Platelets: 154 10*3/uL (ref 150–440)
RBC: 4.33 MIL/uL — AB (ref 4.40–5.90)
RDW: 14.8 % — ABNORMAL HIGH (ref 11.5–14.5)
WBC: 12.1 10*3/uL — ABNORMAL HIGH (ref 3.8–10.6)

## 2018-01-08 LAB — TROPONIN I
TROPONIN I: 0.11 ng/mL — AB (ref <0.03)
TROPONIN I: 0.11 ng/mL — AB (ref <0.03)

## 2018-01-08 LAB — MRSA PCR SCREENING: MRSA by PCR: NEGATIVE

## 2018-01-08 MED ORDER — APIXABAN 5 MG PO TABS
5.0000 mg | ORAL_TABLET | Freq: Two times a day (BID) | ORAL | Status: DC
  Administered 2018-01-08 – 2018-01-11 (×6): 5 mg via ORAL
  Filled 2018-01-08 (×6): qty 1

## 2018-01-08 MED ORDER — AZITHROMYCIN 500 MG PO TABS
ORAL_TABLET | ORAL | Status: AC
  Filled 2018-01-08: qty 1

## 2018-01-08 MED ORDER — AMLODIPINE BESYLATE 10 MG PO TABS
10.0000 mg | ORAL_TABLET | Freq: Every day | ORAL | Status: DC
  Administered 2018-01-08 – 2018-01-11 (×4): 10 mg via ORAL
  Filled 2018-01-08 (×4): qty 1

## 2018-01-08 MED ORDER — METHYLPREDNISOLONE SODIUM SUCC 125 MG IJ SOLR
INTRAMUSCULAR | Status: AC
  Filled 2018-01-08: qty 2

## 2018-01-08 MED ORDER — MOMETASONE FURO-FORMOTEROL FUM 200-5 MCG/ACT IN AERO
2.0000 | INHALATION_SPRAY | Freq: Two times a day (BID) | RESPIRATORY_TRACT | Status: DC
  Administered 2018-01-08 – 2018-01-09 (×3): 2 via RESPIRATORY_TRACT
  Filled 2018-01-08: qty 8.8

## 2018-01-08 MED ORDER — TRAMADOL HCL 50 MG PO TABS
50.0000 mg | ORAL_TABLET | Freq: Four times a day (QID) | ORAL | Status: DC | PRN
Start: 2018-01-08 — End: 2018-01-11

## 2018-01-08 MED ORDER — SPIRONOLACTONE 12.5 MG HALF TABLET
12.5000 mg | ORAL_TABLET | Freq: Every day | ORAL | Status: DC
  Administered 2018-01-08 – 2018-01-11 (×4): 12.5 mg via ORAL
  Filled 2018-01-08 (×4): qty 1

## 2018-01-08 MED ORDER — DOCUSATE SODIUM 100 MG PO CAPS
100.0000 mg | ORAL_CAPSULE | Freq: Two times a day (BID) | ORAL | Status: DC
  Administered 2018-01-10 – 2018-01-11 (×2): 100 mg via ORAL
  Filled 2018-01-08 (×5): qty 1

## 2018-01-08 MED ORDER — FUROSEMIDE 40 MG PO TABS
40.0000 mg | ORAL_TABLET | Freq: Two times a day (BID) | ORAL | Status: DC
  Administered 2018-01-08 – 2018-01-11 (×6): 40 mg via ORAL
  Filled 2018-01-08 (×6): qty 1

## 2018-01-08 MED ORDER — ONDANSETRON HCL 4 MG PO TABS: 4.0000 mg | ORAL_TABLET | Freq: Four times a day (QID) | ORAL | Status: DC | PRN

## 2018-01-08 MED ORDER — IPRATROPIUM-ALBUTEROL 0.5-2.5 (3) MG/3ML IN SOLN
3.0000 mL | Freq: Once | RESPIRATORY_TRACT | Status: AC
  Administered 2018-01-08: 3 mL via RESPIRATORY_TRACT

## 2018-01-08 MED ORDER — LATANOPROST 0.005 % OP SOLN
1.0000 [drp] | Freq: Every day | OPHTHALMIC | Status: DC
  Administered 2018-01-08 – 2018-01-10 (×3): 1 [drp] via OPHTHALMIC
  Filled 2018-01-08: qty 2.5

## 2018-01-08 MED ORDER — GUAIFENESIN 100 MG/5ML PO SOLN
5.0000 mL | ORAL | Status: DC | PRN
  Administered 2018-01-09 – 2018-01-11 (×6): 100 mg via ORAL
  Filled 2018-01-08 (×8): qty 5

## 2018-01-08 MED ORDER — IPRATROPIUM-ALBUTEROL 0.5-2.5 (3) MG/3ML IN SOLN
RESPIRATORY_TRACT | Status: AC
  Filled 2018-01-08: qty 6

## 2018-01-08 MED ORDER — ONDANSETRON HCL 4 MG/2ML IJ SOLN: 4.0000 mg | Freq: Four times a day (QID) | INTRAMUSCULAR | Status: DC | PRN

## 2018-01-08 MED ORDER — VITAMIN D 1000 UNITS PO TABS
1000.0000 [IU] | ORAL_TABLET | Freq: Every day | ORAL | Status: DC
  Administered 2018-01-08 – 2018-01-11 (×4): 1000 [IU] via ORAL
  Filled 2018-01-08 (×4): qty 1

## 2018-01-08 MED ORDER — AZITHROMYCIN 250 MG PO TABS
500.0000 mg | ORAL_TABLET | Freq: Every day | ORAL | Status: DC
  Administered 2018-01-08: 500 mg via ORAL
  Filled 2018-01-08 (×2): qty 2

## 2018-01-08 MED ORDER — MAGNESIUM OXIDE 400 (241.3 MG) MG PO TABS
400.0000 mg | ORAL_TABLET | Freq: Two times a day (BID) | ORAL | Status: DC
  Administered 2018-01-08 – 2018-01-11 (×6): 400 mg via ORAL
  Filled 2018-01-08 (×6): qty 1

## 2018-01-08 MED ORDER — ACETAMINOPHEN 325 MG PO TABS
325.0000 mg | ORAL_TABLET | Freq: Four times a day (QID) | ORAL | Status: DC | PRN
Start: 2018-01-08 — End: 2018-01-11
  Administered 2018-01-09 – 2018-01-10 (×2): 325 mg via ORAL
  Filled 2018-01-08 (×2): qty 1

## 2018-01-08 MED ORDER — METHYLPREDNISOLONE SODIUM SUCC 125 MG IJ SOLR
60.0000 mg | Freq: Four times a day (QID) | INTRAMUSCULAR | Status: DC
  Administered 2018-01-08 – 2018-01-09 (×4): 60 mg via INTRAVENOUS
  Filled 2018-01-08 (×3): qty 2

## 2018-01-08 MED ORDER — METHYLPREDNISOLONE SODIUM SUCC 125 MG IJ SOLR
125.0000 mg | Freq: Once | INTRAMUSCULAR | Status: AC
  Administered 2018-01-08: 125 mg via INTRAVENOUS

## 2018-01-08 MED ORDER — ALBUTEROL SULFATE (2.5 MG/3ML) 0.083% IN NEBU
2.5000 mg | INHALATION_SOLUTION | RESPIRATORY_TRACT | Status: DC | PRN
  Administered 2018-01-10 – 2018-01-11 (×2): 2.5 mg via RESPIRATORY_TRACT
  Filled 2018-01-08 (×2): qty 3

## 2018-01-08 MED ORDER — BRIMONIDINE TARTRATE 0.15 % OP SOLN
1.0000 [drp] | Freq: Three times a day (TID) | OPHTHALMIC | Status: DC
  Administered 2018-01-08 – 2018-01-11 (×9): 1 [drp] via OPHTHALMIC
  Filled 2018-01-08: qty 5

## 2018-01-08 MED ORDER — AMIODARONE HCL 200 MG PO TABS
200.0000 mg | ORAL_TABLET | Freq: Every day | ORAL | Status: DC
  Administered 2018-01-08 – 2018-01-11 (×4): 200 mg via ORAL
  Filled 2018-01-08 (×4): qty 1

## 2018-01-08 MED ORDER — IPRATROPIUM-ALBUTEROL 0.5-2.5 (3) MG/3ML IN SOLN
3.0000 mL | Freq: Four times a day (QID) | RESPIRATORY_TRACT | Status: DC
  Administered 2018-01-08 – 2018-01-11 (×9): 3 mL via RESPIRATORY_TRACT
  Filled 2018-01-08 (×8): qty 3

## 2018-01-08 MED ORDER — NITROGLYCERIN 0.4 MG SL SUBL: 0.4000 mg | SUBLINGUAL_TABLET | SUBLINGUAL | Status: DC | PRN

## 2018-01-08 MED ORDER — ASPIRIN 81 MG PO CHEW
81.0000 mg | CHEWABLE_TABLET | Freq: Every day | ORAL | Status: DC
  Administered 2018-01-08 – 2018-01-11 (×4): 81 mg via ORAL
  Filled 2018-01-08 (×4): qty 1

## 2018-01-08 MED ORDER — METOPROLOL SUCCINATE ER 50 MG PO TB24
200.0000 mg | ORAL_TABLET | Freq: Every day | ORAL | Status: DC
  Administered 2018-01-08 – 2018-01-11 (×4): 200 mg via ORAL
  Filled 2018-01-08 (×2): qty 2
  Filled 2018-01-08: qty 4
  Filled 2018-01-08 (×3): qty 2

## 2018-01-08 NOTE — ED Provider Notes (Signed)
[facility] Emergency Department Provider Note    First MD Initiated Contact with Patient 01/08/18 1001     (approximate)  I have reviewed the triage vital signs and the nursing notes.   HISTORY  Chief Complaint Chest Pain    HPI Darius Taylor is a 72 y.o. male [place] emergency department with progressive dyspnea and chest discomfor and coughing 2 days. Patient states chest discomfort occurs only with coughing no pain at present. patient states the symptoms of been worsening over the past couple days. Patient denies any fever. Patient has any lower extremity pain or swelling. Patient is currently taking Elavil secondary to atrial fibrillation.   Past Medical History:  Diagnosis Date  . Aortic stenosis   . Asthma   . Atrial fibrillation ([place])   . CAD (coronary artery disease)   . Cancer [facility])    colon, bladder and kidney  . COPD (chronic obstructive pulmonary disease) ([place])   . Hypertension     Patient Active Problem List   Diagnosis Date Noted  . Cellulitis 11/18/2017  . Hematoma   . HCAP (healthcare-associated pneumonia) 10/21/2016    Past Surgical History:  Procedure Laterality Date  . CARDIAC DEFIBRILLATOR PLACEMENT    . CORONARY ANGIOPLASTY  09/25/2016  . IRRIGATION AND DEBRIDEMENT HEMATOMA Left 10/24/2016   Procedure: IRRIGATION AND DEBRIDEMENT HEMATOMA;  Surgeon: Florene Glen, MD;  Location: [facility] ORS;  Service: General;  Laterality: Left;  . KIDNEY SURGERY     left removed    Prior to Admission medications   Medication Sig Start Date End Date Taking? Authorizing Provider  acetaminophen (TYLENOL) 325 MG tablet Take 325 mg by mouth every 6 (six) hours as needed.   Yes [provider]  amiodarone (PACERONE) 200 MG tablet Take 200 mg by mouth daily.   Yes [provider]  amLODipine (NORVASC) 10 MG tablet Take 10 mg by mouth daily.   Yes [provider]  apixaban (ELIQUIS) 5 MG TABS tablet Take 5 mg by  mouth 2 (two) times daily.   Yes [provider]  aspirin 81 MG chewable tablet Chew 81 mg by mouth daily.   Yes [provider]  brimonidine (ALPHAGAN) 0.15 % ophthalmic solution Place 1 drop into both eyes 3 (three) times daily. 12/20/17  Yes [provider]  budesonide-formoterol (SYMBICORT) 160-4.5 MCG/ACT inhaler Inhale 2 puffs into the lungs 2 (two) times daily.   Yes [provider]  cholecalciferol (VITAMIN D) 1000 units tablet Take 1,000 Units by mouth daily.   Yes [provider]  furosemide (LASIX) 40 MG tablet Take 40 mg by mouth 2 (two) times daily.    Yes [provider]  guaiFENesin (ROBITUSSIN) 100 MG/5ML SOLN Take 5 mLs (100 mg total) by mouth every 4 (four) hours as needed for cough or to loosen phlegm. 10/26/16  Yes Sudini, Alveta Heimlich, MD  latanoprost (XALATAN) 0.005 % ophthalmic solution Place 1 drop into both eyes at bedtime. 12/20/17  Yes [provider]  magnesium oxide (MAG-OX) 400 MG tablet Take 400 mg by mouth 2 (two) times daily.   Yes [provider]  metoprolol (TOPROL-XL) 200 MG 24 hr tablet Take 200 mg by mouth daily.   Yes [provider]  nitroGLYCERIN (NITROSTAT) 0.4 MG SL tablet Place 0.4 mg under the tongue every 5 (five) minutes as needed for chest pain.   Yes [provider]  spironolactone (ALDACTONE) 25 MG tablet Take 12.5 mg by mouth daily.  Yes [provider]  albuterol (PROVENTIL) (2.5 MG/3ML) 0.083% nebulizer solution Take 2.5 mg by nebulization every 6 (six) hours as needed for wheezing or shortness of breath.    [provider]  oxyCODONE (OXY IR/ROXICODONE) 5 MG immediate release tablet Take 1 tablet (5 mg total) by mouth every 6 (six) hours as needed for moderate pain, severe pain or breakthrough pain. Patient not taking: Reported on 01/08/2018 11/23/17   Epifanio Lesches, MD  predniSONE (STERAPRED UNI-PAK 21 TAB) 10 MG (21) TBPK tablet Taper by 10 mg  daily Patient not taking: Reported on 01/08/2018 11/23/17   Epifanio Lesches, MD    Allergies no known drug allergies  Family History  Problem Relation Age of Onset  . Hypertension Mother     Social History Social History   Tobacco Use  . Smoking status: Former Research scientist (life sciences)  . Smokeless tobacco: Never Used  Substance Use Topics  . Alcohol use: No  . Drug use: No    Review of Systems Constitutional: No fever/chills Eyes: No visual changes. ENT: No sore throat. Cardiovascular: positive for chest pain. Respiratory: positive for shortness of breath. Gastrointestinal: No abdominal pain.  No nausea, no vomiting.  No diarrhea.  No constipation. Genitourinary: Negative for dysuria. Musculoskeletal: Negative for neck pain.  Negative for back pain. Integumentary: Negative for rash. Neurological: Negative for headaches, focal weakness or numbness.   ____________________________________________   PHYSICAL EXAM:  VITAL SIGNS: ED Triage Vitals  Enc Vitals Group     BP 01/08/18 0930 (!) 188/83     Pulse Rate 01/08/18 0930 64     Resp 01/08/18 0930 (!) 25     Temp 01/08/18 0930 98.7 F (37.1 C)     Temp Source 01/08/18 0930 Oral     SpO2 01/08/18 0930 (!) 89 %     Weight 01/08/18 0928 95.3 kg (210 lb)     Height 01/08/18 0928 1.803 m (5\' 11" )     Head Circumference --      Peak Flow --      Pain Score 01/08/18 0928 0     Pain Loc --      Pain Edu? --      Excl. in [place]? --     Constitutional: Alert and oriented. Well appearing and in no acute distress. Eyes: Conjunctivae are normal. PERRL. EOMI. Head: Atraumatic.Marland Kitchen Mouth/Throat: Mucous membranes are moist.  Oropharynx non-erythematous. Neck: No stridor.   Cardiovascular: Normal rate, regular rhythm. Good peripheral circulation. Grossly normal heart sounds. Respiratory: tachypnea, bilateral lateral diffuse wheezing and rhonchi. Gastrointestinal: Soft and nontender. No distention.  Musculoskeletal: No lower extremity  tenderness nor edema. No gross deformities of extremities. Neurologic:  Normal speech and language. No gross focal neurologic deficits are appreciated.  Skin:  Skin is warm, dry and intact. No rash noted. Psychiatric: Mood and affect are normal. Speech and behavior are normal.  ____________________________________________   LABS (all labs ordered are listed, but only abnormal results are displayed)  Labs Reviewed  BASIC METABOLIC PANEL - Abnormal; Notable for the following components:      Result Value   Chloride 100 (*)    Glucose, Bld 124 (*)    BUN 31 (*)    Creatinine, Ser 1.83 (*)    GFR calc non Af Amer 35 (*)    GFR calc Af Amer 41 (*)    All other components within normal limits  CBC - Abnormal; Notable for the following components:   WBC 12.1 (*)    RBC  4.33 (*)    HCT 39.4 (*)    RDW 14.8 (*)    All other components within normal limits  TROPONIN I - Abnormal; Notable for the following components:   Troponin I 0.11 (*)    All other components within normal limits   ____________________________________________  EKG  ED ECG REPORT I, Alvo N Samanvi Cuccia, the attending physician, personally viewed and interpreted this ECG.   Date: 01/08/2018  EKG Time: 9:31 AM  Rate: 65  Rhythm: atrial fibrillation  Axis: normal  Intervals:irregular RR interval  ST&T Change: none  ____________________________________________  RADIOLOGY I, Lyon N Zhane Donlan, personally viewed and evaluated these images (plain radiographs) as part of my medical decision making, as well as reviewing the written report by the radiologist.  ED MD interpretation:  no active cardiopulmonary disease noted on chest x-ray.  Official radiology report(s): Dg Chest 2 View  Result Date: 01/08/2018 CLINICAL DATA:  Chest pain EXAM: CHEST - 2 VIEW COMPARISON:  11/21/2017 FINDINGS: There is no focal parenchymal opacity. There is no pleural effusion or pneumothorax. 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 There  is stable cardiomegaly. There is thoracic aortic atherosclerosis. The osseous structures are unremarkable. IMPRESSION: No active cardiopulmonary disease. Electronically Signed   By: Kathreen Devoid   On: 01/08/2018 10:10      .Critical Care Performed by: Gregor Hams, MD Authorized by: Gregor Hams, MD   Critical care provider statement:    Critical care time (minutes):  30   Critical care time was exclusive of:  Separately billable procedures and treating other patients   Critical care was necessary to treat or prevent imminent or life-threatening deterioration of the following conditions:  Respiratory failure   Critical care was time spent personally by me on the following activities:  Development of treatment plan with patient or surrogate, discussions with consultants, evaluation of patient's response to treatment, examination of patient, obtaining history from patient or surrogate, ordering and performing treatments and interventions, ordering and review of laboratory studies, ordering and review of radiographic studies, pulse oximetry, re-evaluation of patient's condition and review of old charts   I assumed direction of critical care for this patient from another provider in my specialty: no       ____________________________________________   INITIAL IMPRESSION / ASSESSMENT AND PLAN / ED COURSE  As part of my medical decision making, I reviewed the following data within the electronic MEDICAL RECORD NUMBER   72 year old male presenting with above stated history of physical exam secondary to respiratory difficulty cough and chest discomfort. Consider possibility of cardiac etiology and a such EKG was performed which revealed no evidence of infarction. Chest x-ray revealed no acute cardiopulmonary findings. Laboratory data notable for troponin elevated at 0.11 however review of the patient's chart revealed the elevated troponin. Patient given 2 DuoNeb's after my evaluation as well  as 125 mg of Solu-Medrol secondary to concern for possible COPD exacerbation. Despite above stated treatment patient respiratory status improved however oxygen saturation 87% on room air. As such patient discussed with Dr. Araceli Bouche for hospital admission further evaluation and management ____________________________________________  FINAL CLINICAL IMPRESSION(S) / ED DIAGNOSES  Final diagnoses:  COPD with acute exacerbation ([place])     MEDICATIONS GIVEN DURING THIS VISIT:  Medications  methylPREDNISolone sodium succinate (SOLU-MEDROL) 125 mg/2 mL injection (has no administration in time range)  ipratropium-albuterol (DUONEB) 0.5-2.5 (3) MG/3ML nebulizer solution 3 mL (3 mLs Nebulization Given 01/08/18 1024)  ipratropium-albuterol (DUONEB) 0.5-2.5 (3) MG/3ML nebulizer solution 3 mL (  3 mLs Nebulization Given 01/08/18 1024)  methylPREDNISolone sodium succinate (SOLU-MEDROL) 125 mg/2 mL injection 125 mg (125 mg Intravenous Given 01/08/18 1024)     ED Discharge Orders    None       Note:  This document was prepared using Dragon voice recognition software and may include unintentional dictation errors.    Gregor Hams, MD 01/08/18 1435

## 2018-01-08 NOTE — ED Notes (Signed)
PT placed back on 2L. ED MD and admitting MD aware.

## 2018-01-08 NOTE — ED Triage Notes (Addendum)
Pt here for pain across entire chest only when coughing. Has been SHOB.  Tachypnea present, mild increased WOB.  Brown productive cough per pt.  On eliquis.  Color WNL. Per pt wears O2 intermittent at home.  Last sat able to find in records was 94 RA

## 2018-01-08 NOTE — ED Notes (Signed)
Report to Shannon, RN  

## 2018-01-08 NOTE — ED Notes (Signed)
Date and time results received: 01/08/18 10:12AM (use smartphrase ".now" to insert current time)  Test: troponin 0.11  Name of Provider Notified: Dr. Marjean Donna

## 2018-01-08 NOTE — H&P (Signed)
Darius Taylor at [place] NAME: Darius Taylor    MR#:  [id number]  DATE OF BIRTH:  [birth year]  DATE OF ADMISSION:  01/08/2018  PRIMARY CARE PHYSICIAN: [facility]   REQUESTING/REFERRING PHYSICIAN: Gregor Hams, MD  CHIEF COMPLAINT:   sob HISTORY OF PRESENT ILLNESS:  Darius Taylor  is a 72 y.o. male with a known history of chronic COPD, hypertension, coronary artery disease, chronic atrial fibrillation on Darius Taylor is presenting to the ED with a chief complaint of chest tightness associated with shortness of breath and cough which has been getting worse for the past 2 days.  Patient came into the emergency department.  Denies any fever or lower extremity swelling..  Chest x-ray is negative.  Patient was given Solu-Medrol and bronchodilator treatments and reassessed for hypoxia.  Patient was desaturating to 78% on room air, he was placed on oxygen and hospitalist team was called to admit the patient for COPD exacerbation.  Patient reports that he quit smoking long back ago  PAST MEDICAL HISTORY:   Past Medical History:  Diagnosis Date  . Aortic stenosis   . Asthma   . Atrial fibrillation ([place])   . CAD (coronary artery disease)   . Cancer [facility])    colon, bladder and kidney  . COPD (chronic obstructive pulmonary disease) ([place])   . Hypertension     PAST SURGICAL HISTOIRY:   Past Surgical History:  Procedure Laterality Date  . CARDIAC DEFIBRILLATOR PLACEMENT    . CORONARY ANGIOPLASTY  09/25/2016  . IRRIGATION AND DEBRIDEMENT HEMATOMA Left 10/24/2016   Procedure: IRRIGATION AND DEBRIDEMENT HEMATOMA;  Surgeon: Florene Glen, MD;  Location: [facility] ORS;  Service: General;  Laterality: Left;  . KIDNEY SURGERY     left removed    SOCIAL HISTORY:   Social History   Tobacco Use  . Smoking status: Former Research scientist (life sciences)  . Smokeless tobacco: Never Used  Substance Use Topics  . Alcohol use: No    FAMILY HISTORY:   Family  History  Problem Relation Age of Onset  . Hypertension Mother     DRUG ALLERGIES:  No Known Allergies  REVIEW OF SYSTEMS:  CONSTITUTIONAL: No fever, fatigue or weakness.  EYES: No blurred or double vision.  EARS, NOSE, AND THROAT: No tinnitus or ear pain.  RESPIRATORY: Reporting cough, shortness of breath, wheezing , denies hemoptysis.  CARDIOVASCULAR: No chest pain, orthopnea, edema.  GASTROINTESTINAL: No nausea, vomiting, diarrhea or abdominal pain.  GENITOURINARY: No dysuria, hematuria.  ENDOCRINE: No polyuria, nocturia,  HEMATOLOGY: No anemia, easy bruising or bleeding SKIN: No rash or lesion. MUSCULOSKELETAL: No joint pain or arthritis.   NEUROLOGIC: No tingling, numbness, weakness.  PSYCHIATRY: No anxiety or depression.   MEDICATIONS AT HOME:   Prior to Admission medications   Medication Sig Start Date End Date Taking? Authorizing Provider  acetaminophen (TYLENOL) 325 MG tablet Take 325 mg by mouth every 6 (six) hours as needed.   Yes [provider]  amiodarone (PACERONE) 200 MG tablet Take 200 mg by mouth daily.   Yes [provider]  amLODipine (NORVASC) 10 MG tablet Take 10 mg by mouth daily.   Yes [provider]  apixaban (Darius Taylor) 5 MG TABS tablet Take 5 mg by mouth 2 (two) times daily.   Yes [provider]  aspirin 81 MG chewable tablet Chew 81 mg by mouth daily.   Yes [provider]  brimonidine (ALPHAGAN) 0.15 % ophthalmic solution Place 1 drop  into both eyes 3 (three) times daily. 12/20/17  Yes [provider]  budesonide-formoterol (SYMBICORT) 160-4.5 MCG/ACT inhaler Inhale 2 puffs into the lungs 2 (two) times daily.   Yes [provider]  cholecalciferol (VITAMIN D) 1000 units tablet Take 1,000 Units by mouth daily.   Yes [provider]  furosemide (LASIX) 40 MG tablet Take 40 mg by mouth 2 (two) times daily.    Yes [provider]  guaiFENesin (ROBITUSSIN) 100 MG/5ML SOLN Take 5  mLs (100 mg total) by mouth every 4 (four) hours as needed for cough or to loosen phlegm. 10/26/16  Yes Sudini, Alveta Heimlich, MD  latanoprost (XALATAN) 0.005 % ophthalmic solution Place 1 drop into both eyes at bedtime. 12/20/17  Yes [provider]  magnesium oxide (MAG-OX) 400 MG tablet Take 400 mg by mouth 2 (two) times daily.   Yes [provider]  metoprolol (TOPROL-XL) 200 MG 24 hr tablet Take 200 mg by mouth daily.   Yes [provider]  nitroGLYCERIN (NITROSTAT) 0.4 MG SL tablet Place 0.4 mg under the tongue every 5 (five) minutes as needed for chest pain.   Yes [provider]  spironolactone (ALDACTONE) 25 MG tablet Take 12.5 mg by mouth daily.   Yes [provider]  albuterol (PROVENTIL) (2.5 MG/3ML) 0.083% nebulizer solution Take 2.5 mg by nebulization every 6 (six) hours as needed for wheezing or shortness of breath.    [provider]  oxyCODONE (OXY IR/ROXICODONE) 5 MG immediate release tablet Take 1 tablet (5 mg total) by mouth every 6 (six) hours as needed for moderate pain, severe pain or breakthrough pain. Patient not taking: Reported on 01/08/2018 11/23/17   Epifanio Lesches, MD  predniSONE (STERAPRED UNI-PAK 21 TAB) 10 MG (21) TBPK tablet Taper by 10 mg daily Patient not taking: Reported on 01/08/2018 11/23/17   Epifanio Lesches, MD      VITAL SIGNS:  Blood pressure 137/81, pulse 64, temperature 98.7 F (37.1 C), temperature source Oral, resp. rate 19, height 5\' 11"  (1.803 m), weight 95.3 kg (210 lb), SpO2 96 %.  PHYSICAL EXAMINATION:  GENERAL:  72 y.o.-year-old patient lying in the bed with no acute distress.  EYES: Pupils equal, round, reactive to light and accommodation. No scleral icterus. Extraocular muscles intact.  HEENT: Head atraumatic, normocephalic. Oropharynx and nasopharynx clear.  NECK:  Supple, no jugular venous distention. No thyroid enlargement, no tenderness.  LUNGS: Diminished breath sounds, no wheezing,  rales,rhonchi or crepitation. No use of accessory muscles of respiration.  CARDIOVASCULAR: S1, S2 normal. No murmurs, rubs, or gallops.  ABDOMEN: Soft, nontender, nondistended. Bowel sounds present. No organomegaly or mass.  EXTREMITIES: No pedal edema, cyanosis, or clubbing.  NEUROLOGIC: Cranial nerves II through XII are intact. Muscle strength 5/5 in all extremities. Sensation intact. Gait not checked.  PSYCHIATRIC: The patient is alert and oriented x 3.  SKIN: No obvious rash, lesion, or ulcer.   LABORATORY PANEL:   CBC Recent Labs  Lab 01/08/18 0934  WBC 12.1*  HGB 13.1  HCT 39.4*  PLT 154   ------------------------------------------------------------------------------------------------------------------  Chemistries  Recent Labs  Lab 01/08/18 0934  NA 137  K 4.6  CL 100*  CO2 30  GLUCOSE 124*  BUN 31*  CREATININE 1.83*  CALCIUM 9.3   ------------------------------------------------------------------------------------------------------------------  Cardiac Enzymes Recent Labs  Lab 01/08/18 1210  TROPONINI 0.11*   ------------------------------------------------------------------------------------------------------------------  RADIOLOGY:  Dg Chest 2 View  Result Date: 01/08/2018 CLINICAL DATA:  Chest pain EXAM: CHEST - 2 VIEW COMPARISON:  11/21/2017 FINDINGS: There is no focal parenchymal opacity. There is no pleural effusion or pneumothorax. 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 There is stable cardiomegaly. There is thoracic aortic atherosclerosis. The osseous structures are unremarkable. IMPRESSION: No active cardiopulmonary disease. Electronically Signed   By: Kathreen Devoid   On: 01/08/2018 10:10    EKG:   Orders placed or performed during the hospital encounter of 01/08/18  . ED EKG within 10 minutes  . ED EKG within 10 minutes    IMPRESSION AND PLAN:   Darius Taylor  is a 72 y.o. male with a known history of chronic COPD, hypertension, coronary  artery disease, chronic atrial fibrillation on Darius Taylor is presenting to the ED with a chief complaint of chest tightness associated with shortness of breath and cough which has been getting worse for the past 2 days.  Patient came into the emergency department.  Denies any fever or lower extremity swelling..  Chest x-ray is negative.  Patient was given Solu-Medrol and bronchodilator treatments and reassessed for hypoxia.  Patient was desaturating to 78% on room air,  #Acute hypoxic respiratory failure secondary to COPD exacerbation Admit to MedSurg unit Solu-Medrol, bronchodilator treatments and oxygen Wean off oxygen as tolerated Azithromycin  #AKi Hydrate with IV fluids and avoid nephrotoxins Check renal function in a.m.  #Chronic atrial fibrillation rate controlled Continue amiodarone, metoprolol and Darius Taylor  #Chronic CHF no exacerbation Continue home medications Lasix, spironolactone and metoprolol  #Essential hypertension Continue home medication metoprolol, Lasix and spironolactone  GI and DVT prophylaxis    All the records are reviewed and case discussed with ED provider. Management plans discussed with the patient, family and they are in agreement.  CODE STATUS: fc   TOTAL TIME TAKING CARE OF THIS PATIENT: 43  minutes.   Note: This dictation was prepared with Dragon dictation along with smaller phrase technology. Any transcriptional errors that result from this process are unintentional.  Nicholes Mango M.D on 01/08/2018 at 3:44 PM  Between 7am to 6pm - Pager - [phone]  After 6pm go to [email or web address] - password [email or web address] [facility]  [facility]  Office  [phone]  CC: Primary care physician; [employer]

## 2018-01-08 NOTE — Progress Notes (Signed)
Family Meeting Note  Advance Directive:yes  Today a meeting took place with the Patient.    The following clinical team members were present during this meeting:MD  The following were discussed:Patient's diagnosis: Acute respiratory failure, COPD exacerbation, AK I, chronic atrial fibrillation, coronary artery disease, hypertension, treatment plan of care discussed in detail with the patient.  He verbalized understanding of the plan  patient's progosis: Unable to determine and Goals for treatment: Full Code, daughter Shelbie Hutching  Additional follow-up to be provided:  Hospitalist  Time spent during discussion:18 min  Nicholes Mango, MD

## 2018-01-08 NOTE — ED Notes (Signed)
First Nurse Note:  Patient to room 17, Ally RN aware of placement.

## 2018-01-09 DIAGNOSIS — J441 Chronic obstructive pulmonary disease with (acute) exacerbation: Secondary | ICD-10-CM | POA: Diagnosis not present

## 2018-01-09 LAB — CBC
HCT: 36.2 % — ABNORMAL LOW (ref 40.0–52.0)
Hemoglobin: 12.3 g/dL — ABNORMAL LOW (ref 13.0–18.0)
MCH: 31 pg (ref 26.0–34.0)
MCHC: 33.8 g/dL (ref 32.0–36.0)
MCV: 91.5 fL (ref 80.0–100.0)
PLATELETS: 147 10*3/uL — AB (ref 150–440)
RBC: 3.96 MIL/uL — AB (ref 4.40–5.90)
RDW: 14.7 % — ABNORMAL HIGH (ref 11.5–14.5)
WBC: 15.3 10*3/uL — ABNORMAL HIGH (ref 3.8–10.6)

## 2018-01-09 LAB — BASIC METABOLIC PANEL
Anion gap: 7 (ref 5–15)
BUN: 47 mg/dL — AB (ref 6–20)
CO2: 29 mmol/L (ref 22–32)
Calcium: 9.2 mg/dL (ref 8.9–10.3)
Chloride: 102 mmol/L (ref 101–111)
Creatinine, Ser: 1.72 mg/dL — ABNORMAL HIGH (ref 0.61–1.24)
GFR, EST AFRICAN AMERICAN: 44 mL/min — AB (ref >60)
GFR, EST NON AFRICAN AMERICAN: 38 mL/min — AB (ref >60)
Glucose, Bld: 171 mg/dL — ABNORMAL HIGH (ref 65–99)
POTASSIUM: 4.5 mmol/L (ref 3.5–5.1)
SODIUM: 138 mmol/L (ref 135–145)

## 2018-01-09 MED ORDER — AZITHROMYCIN 250 MG PO TABS
250.0000 mg | ORAL_TABLET | Freq: Every day | ORAL | Status: DC
  Administered 2018-01-10 – 2018-01-11 (×2): 250 mg via ORAL
  Filled 2018-01-09 (×2): qty 1

## 2018-01-09 MED ORDER — AZITHROMYCIN 250 MG PO TABS
500.0000 mg | ORAL_TABLET | Freq: Every day | ORAL | Status: DC
  Administered 2018-01-09: 500 mg via ORAL

## 2018-01-09 MED ORDER — SODIUM CHLORIDE 0.9% FLUSH
3.0000 mL | Freq: Two times a day (BID) | INTRAVENOUS | Status: DC
  Administered 2018-01-09 – 2018-01-11 (×5): 3 mL via INTRAVENOUS

## 2018-01-09 MED ORDER — SODIUM CHLORIDE 0.9% FLUSH
3.0000 mL | Freq: Two times a day (BID) | INTRAVENOUS | Status: DC
  Administered 2018-01-09 – 2018-01-11 (×3): 3 mL via INTRAVENOUS

## 2018-01-09 MED ORDER — PHENOL 1.4 % MT LIQD
1.0000 | OROMUCOSAL | Status: DC | PRN
  Administered 2018-01-09: 1 via OROMUCOSAL
  Filled 2018-01-09: qty 177

## 2018-01-09 MED ORDER — SODIUM CHLORIDE 0.9% FLUSH: 3.0000 mL | INTRAVENOUS | Status: DC | PRN

## 2018-01-09 MED ORDER — METHYLPREDNISOLONE SODIUM SUCC 125 MG IJ SOLR
60.0000 mg | Freq: Every day | INTRAMUSCULAR | Status: DC
  Administered 2018-01-10: 60 mg via INTRAVENOUS
  Filled 2018-01-09: qty 2

## 2018-01-09 NOTE — Progress Notes (Signed)
Patient is very pleased that we are going to address the problem with his left third toe.

## 2018-01-09 NOTE — Progress Notes (Signed)
[place] at [place] NAME: Darius Taylor    MR#:  [id number]  DATE OF BIRTH:  [birth year]  SUBJECTIVE:  came in with increasing shortness of breath and cough. Complains of left fourth toe pain for last four weeks. No open wound or skin drainage.  REVIEW OF SYSTEMS:   Review of Systems  Constitutional: Negative for chills, fever and weight loss.  HENT: Negative for ear discharge, ear pain and nosebleeds.   Eyes: Negative for blurred vision, pain and discharge.  Respiratory: Negative for sputum production, shortness of breath, wheezing and stridor.   Cardiovascular: Negative for chest pain, palpitations, orthopnea and PND.  Gastrointestinal: Negative for abdominal pain, diarrhea, nausea and vomiting.  Genitourinary: Negative for frequency and urgency.  Musculoskeletal: Negative for back pain and joint pain.  Neurological: Negative for sensory change, speech change, focal weakness and weakness.  Psychiatric/Behavioral: Negative for depression and hallucinations. The patient is not nervous/anxious.    Tolerating Diet:yesTolerating PT: ambulatory  DRUG ALLERGIES:  No Known Allergies  VITALS:  Blood pressure (!) 132/97, pulse 61, temperature 98 F (36.7 C), temperature source Oral, resp. rate 18, height 5\' 11"  (1.803 m), weight 94.5 kg (208 lb 4.8 oz), SpO2 92 %.  PHYSICAL EXAMINATION:   Physical Exam  GENERAL:  72 y.o.-year-old patient lying in the bed with no acute distress.  EYES: Pupils equal, round, reactive to light and accommodation. No scleral icterus. Extraocular muscles intact.  HEENT: Head atraumatic, normocephalic. Oropharynx and nasopharynx clear.  NECK:  Supple, no jugular venous distention. No thyroid enlargement, no tenderness.  LUNGS: course breath sounds bilaterally, no wheezing, rales, rhonchi. No use of accessory muscles of respiration.  CARDIOVASCULAR: S1, S2 normal. No murmurs, rubs, or gallops.  ABDOMEN:  Soft, nontender, nondistended. Bowel sounds present. No organomegaly or mass.  EXTREMITIES: No cyanosis, clubbing or edema b/l.   Left fourth digit swollen and tender. Skin intact. No drainage from any source. NEUROLOGIC: Cranial nerves II through XII are intact. No focal Motor or sensory deficits b/l.   PSYCHIATRIC:  patient is alert and oriented x 3.  SKIN: No obvious rash, lesion, or ulcer.   LABORATORY PANEL:  CBC Recent Labs  Lab 01/09/18 0641  WBC 15.3*  HGB 12.3*  HCT 36.2*  PLT 147*    Chemistries  Recent Labs  Lab 01/09/18 0641  NA 138  K 4.5  CL 102  CO2 29  GLUCOSE 171*  BUN 47*  CREATININE 1.72*  CALCIUM 9.2   Cardiac Enzymes Recent Labs  Lab 01/08/18 1210  TROPONINI 0.11*   RADIOLOGY:  Dg Chest 2 View  Result Date: 01/08/2018 CLINICAL DATA:  Chest pain EXAM: CHEST - 2 VIEW COMPARISON:  11/21/2017 FINDINGS: There is no focal parenchymal opacity. There is no pleural effusion or pneumothorax. 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 There is stable cardiomegaly. There is thoracic aortic atherosclerosis. The osseous structures are unremarkable. IMPRESSION: No active cardiopulmonary disease. Electronically Signed   By: Kathreen Devoid   On: 01/08/2018 10:10   ASSESSMENT AND PLAN:  Darius Taylor  is a 72 y.o. male with a known history of chronic COPD, hypertension, coronary artery disease, chronic atrial fibrillation on Eliquis is presenting to the ED with a chief complaint of chest tightness associated with shortness of breath and cough which has been getting worse for the past 2 days.   Chest x-ray is negative. Patient was desaturating to 78% on room air,  #Acute hypoxic respiratory failure secondary  to COPD exacerbation Solu-Medrol, bronchodilator treatments and oxygen Wean off oxygen as tolerated. Patient has home oxygen for as-needed basis use Empiric azithromycin  #AKi Hydrate with IV fluids and avoid nephrotoxins Check renal function i n a.m.  #Chronic  atrial fibrillation rate controlled Continue amiodarone, metoprolol and Eliquis  #Chronic CHF no exacerbation Continue home medications Lasix, spironolactone and metoprolol  #Essential hypertension Continue home medication metoprolol, Lasix and spironolactone   #left fourth toe swelling and pain -x-ray of the left foot discussed with podiatry Dr. Cleda Mccreedy. Patient does not have any signs of infection. It seems this is gout related. Patient has history of gout. Podiatry recommends continue steroids for now and PRN pain meds. If no improvement follow-up as outpatient.   Case discussed with Care Management/Social Worker. Management plans discussed with the patient, family and they are in agreement.  CODE STATUS: full DVT Prophylaxis: lovenox  TOTAL TIME TAKING CARE OF THIS PATIENT: 40 minutes.  >50% time spent on counselling and coordination of care  POSSIBLE D/C IN 1-2 DAYS, DEPENDING ON CLINICAL CONDITION.  Note: This dictation was prepared with Dragon dictation along with smaller phrase technology. Any transcriptional errors that result from this process are unintentional.  Fritzi Mandes M.D on 01/09/2018 at 12:00 PM  Between 7am to 6pm - Pager - [phone]  After 6pm go to [email or web address] - password [email or web address] [facility]  Office  [phone]  CC: Primary care physician; Inc, [place] ServicesPatient ID: Darius Taylor, male   DOB: 12-12-1945, 72 y.o.   MRN: [id number]

## 2018-01-10 ENCOUNTER — Inpatient Hospital Stay: Payer: Medicare Other

## 2018-01-10 DIAGNOSIS — J441 Chronic obstructive pulmonary disease with (acute) exacerbation: Secondary | ICD-10-CM | POA: Diagnosis not present

## 2018-01-10 MED ORDER — BUDESONIDE 0.25 MG/2ML IN SUSP
0.2500 mg | Freq: Two times a day (BID) | RESPIRATORY_TRACT | Status: DC
  Administered 2018-01-10 – 2018-01-11 (×2): 0.25 mg via RESPIRATORY_TRACT
  Filled 2018-01-10 (×2): qty 2

## 2018-01-10 MED ORDER — GUAIFENESIN ER 600 MG PO TB12
600.0000 mg | ORAL_TABLET | Freq: Two times a day (BID) | ORAL | Status: DC
  Administered 2018-01-10 – 2018-01-11 (×3): 600 mg via ORAL
  Filled 2018-01-10 (×3): qty 1

## 2018-01-10 MED ORDER — METHYLPREDNISOLONE SODIUM SUCC 125 MG IJ SOLR
60.0000 mg | Freq: Two times a day (BID) | INTRAMUSCULAR | Status: DC
  Administered 2018-01-10 – 2018-01-11 (×2): 60 mg via INTRAVENOUS
  Filled 2018-01-10 (×2): qty 2

## 2018-01-10 MED ORDER — TECHNETIUM TC 99M MEDRONATE IV KIT
22.6520 | PACK | Freq: Once | INTRAVENOUS | Status: AC | PRN
  Administered 2018-01-10: 22.652 via INTRAVENOUS

## 2018-01-10 NOTE — Plan of Care (Signed)

## 2018-01-10 NOTE — Progress Notes (Signed)
[place] at [place] NAME: Darius Taylor    MR#:  [id number]  DATE OF BIRTH:  May 05, 1946  SUBJECTIVE:  Since continues to complain of left fourth toe breathing should slowly improving  REVIEW OF SYSTEMS:   Review of Systems  Constitutional: Negative for chills, fever and weight loss.  HENT: Negative for ear discharge, ear pain and nosebleeds.   Eyes: Negative for blurred vision, pain and discharge.  Respiratory: Negative for sputum production, shortness of breath, wheezing and stridor.   Cardiovascular: Negative for chest pain, palpitations, orthopnea and PND.  Gastrointestinal: Negative for abdominal pain, diarrhea, nausea and vomiting.  Genitourinary: Negative for frequency and urgency.  Musculoskeletal: Negative for back pain and joint pain.  Neurological: Negative for sensory change, speech change, focal weakness and weakness.  Psychiatric/Behavioral: Negative for depression and hallucinations. The patient is not nervous/anxious.    Tolerating Diet:yesTolerating PT: ambulatory  DRUG ALLERGIES:  No Known Allergies  VITALS:  Blood pressure 129/79, pulse 65, temperature 98.3 F (36.8 C), temperature source Oral, resp. rate 16, height 5\' 11"  (1.803 m), weight 94.5 kg (208 lb 4.8 oz), SpO2 90 %.  PHYSICAL EXAMINATION:   Physical Exam  GENERAL:  72 y.o.-year-old patient lying in the bed with no acute distress.  EYES: Pupils equal, round, reactive to light and accommodation. No scleral icterus. Extraocular muscles intact.  HEENT: Head atraumatic, normocephalic. Oropharynx and nasopharynx clear.  NECK:  Supple, no jugular venous distention. No thyroid enlargement, no tenderness.  LUNGS: course breath sounds bilaterally, no wheezing, rales, rhonchi. No use of accessory muscles of respiration.  CARDIOVASCULAR: S1, S2 normal. No murmurs, rubs, or gallops.  ABDOMEN: Soft, nontender, nondistended. Bowel sounds present. No organomegaly  or mass.  EXTREMITIES: No cyanosis, clubbing or edema b/l.   Left fourth toe little swollen. Skin intact. No drainage from any source. NEUROLOGIC: Cranial nerves II through XII are intact. No focal Motor or sensory deficits b/l.   PSYCHIATRIC:  patient is alert and oriented x 3.  SKIN: No obvious rash, lesion, or ulcer.   LABORATORY PANEL:  CBC Recent Labs  Lab 01/09/18 0641  WBC 15.3*  HGB 12.3*  HCT 36.2*  PLT 147*    Chemistries  Recent Labs  Lab 01/09/18 0641  NA 138  K 4.5  CL 102  CO2 29  GLUCOSE 171*  BUN 47*  CREATININE 1.72*  CALCIUM 9.2   Cardiac Enzymes Recent Labs  Lab 01/08/18 1210  TROPONINI 0.11*   RADIOLOGY:  No results found. ASSESSMENT AND PLAN:  Darius Taylor  is a 72 y.o. male with a known history of chronic COPD, hypertension, coronary artery disease, chronic atrial fibrillation on Eliquis is presenting to the ED with a chief complaint of chest tightness associated with shortness of breath and cough which has been getting worse for the past 2 days.   Chest x-ray is negative. Patient was desaturating to 78% on room air,  #Acute hypoxic respiratory failure secondary to COPD exacerbation Continue Solu-Medrol, bronchodilator treatments and oxygen Wean off oxygen as tolerated. Patient has home oxygen for as-needed basis use Empiric azithromycin  #AKi Hydrate with IV fluids and avoid nephrotoxins Renal function stable  #Chronic atrial fibrillation rate controlled Continue amiodarone, metoprolol and Eliquis  #Chronic CHF no exacerbation Continue home medications Lasix, spironolactone and metoprolol  #Essential hypertension Continue home medication metoprolol, Lasix and spironolactone   #left fourth toe swelling and pain -x-ray of the left foot discussed with podiatry Dr. Cleda Mccreedy. Patient  does not have any signs of infection. It seems this is gout related.  I discussed the case with patient's primary care provider who called patient's room  she stated that has been treated twice with prednisone for the same condition for gout flare continues to have inflammation I will order a bone scan since patient cannot have an MRI.   Case discussed with Care Management/Social Worker. Management plans discussed with the patient, family and they are in agreement.  CODE STATUS: full DVT Prophylaxis: lovenox  TOTAL TIME TAKING CARE OF THIS PATIENT: 35 minutes.  >50% time spent on counselling and coordination of care  POSSIBLE D/C IN 1-2 DAYS, DEPENDING ON CLINICAL CONDITION.  Note: This dictation was prepared with Dragon dictation along with smaller phrase technology. Any transcriptional errors that result from this process are unintentional.  Dustin Flock M.D on 01/10/2018 at 2:51 PM  Between 7am to 6pm - Pager - [phone]  After 6pm go to [email or web address] - password [email or web address] [facility]  Office  [phone]  CC: Primary care physician; Inc, [place] ServicesPatient ID: Darius Taylor, male   DOB: Aug 17, 1946, 72 y.o.   MRN: [id number]

## 2018-01-10 NOTE — Plan of Care (Signed)
  Problem: Education: Goal: Knowledge of General Education information will improve Outcome: Progressing   Problem: Health Behavior/Discharge Planning: Goal: Ability to manage health-related needs will improve Outcome: Progressing   Problem: Clinical Measurements: Goal: Ability to maintain clinical measurements within normal limits will improve Outcome: Progressing   

## 2018-01-11 MED ORDER — COLCHICINE 0.6 MG PO TABS: 0.6000 mg | ORAL_TABLET | Freq: Two times a day (BID) | ORAL | 11 refills | Status: DC

## 2018-01-11 MED ORDER — ALLOPURINOL 100 MG PO TABS: 100.0000 mg | ORAL_TABLET | Freq: Every day | ORAL | 11 refills | Status: DC

## 2018-01-11 MED ORDER — PREDNISONE 10 MG (21) PO TBPK: ORAL_TABLET | ORAL | 0 refills | Status: DC

## 2018-01-11 NOTE — Plan of Care (Signed)
  Problem: Education: Goal: Knowledge of General Education information will improve Outcome: Progressing   Problem: Health Behavior/Discharge Planning: Goal: Ability to manage health-related needs will improve Outcome: Progressing   Problem: Clinical Measurements: Goal: Will remain free from infection Outcome: Progressing   

## 2018-01-11 NOTE — Discharge Summary (Signed)
[place] at [facility], 72 y.o., DOB 1945/11/25, MRN [id number]. Admission date: 01/08/2018 Discharge Date 01/11/2018 [facility], Mead Gouru, MD  Admission Diagnosis  COPD with acute exacerbation [facility]) [J44.1]  Discharge Diagnosis   Active Problems: Acute hypoxic respiratory failure due to COPD exasperation Acute kidney injury Left fourth toe swelling and pain seen by podiatry seems to think that this is gout related Chronic atrial fibrillation Chronic CHF Essential hypertension         Hospital Course Darius Taylor  is a 72 y.o. male with a known history of chronic COPD, hypertension, coronary artery disease, chronic atrial fibrillation on Eliquis is presenting to the ED with a chief complaint of chest tightness associated with shortness of breath and cough which has been getting worse for the past 2 days. Pt is was treated with Solu-Medrol nebulizer therapy for his COPD exasperation which improved with treatment.  Patient also was noted to have left fourth toe swelling and pain.  According to his primary care provider he was treated with prednisone 2 rounds.  Patient underwent x-ray which showed destructive changes due to possible osteomyelitis.  Therefore he underwent a bone scan which showed similar kind of findings.  I asked Dr. Caryl Comes of podiatry to see the patient because of the bone scan.  He reviewed the bone scan and patient's foot he feels strongly that this is related to gout and not osteomyelitis.  Patient will be started on allopurinol and colchicine.  He will need to follow-up outpatient with rheumatology.               Consults  Podiatry  Significant Tests:  See full reports for all details     Dg Chest 2 View  Result Date: 01/08/2018 CLINICAL DATA:  Chest pain EXAM: CHEST - 2 VIEW COMPARISON:  11/21/2017 FINDINGS: There is no focal parenchymal opacity. There is no  pleural effusion or pneumothorax. 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 There is stable cardiomegaly. There is thoracic aortic atherosclerosis. The osseous structures are unremarkable. IMPRESSION: No active cardiopulmonary disease. Electronically Signed   By: Kathreen Devoid   On: 01/08/2018 10:10   Dg Elbow 2 Views Left  Result Date: 01/02/2018 CLINICAL DATA:  Pain and swelling of the posterior forearm EXAM: LEFT ELBOW - 2 VIEW COMPARISON:  None. FINDINGS: The left elbow joint space appears normal without acute abnormality or significant degenerative change. Minimal spurring is noted at the ulna are humeral articulation. No joint effusion is seen. Arterial calcifications are noted diffusely. IMPRESSION: 1. No acute abnormality.  Minimal degenerative change. 2. Diffuse arterial calcifications. Electronically Signed   By: Ivar Drape M.D.   On: 01/02/2018 09:56   Dg Forearm Left  Result Date: 01/02/2018 CLINICAL DATA:  Pain and swelling in the left forearm for a month, no known injury EXAM: LEFT FOREARM - 2 VIEW COMPARISON:  None. FINDINGS: The left radius and left ulna are intact and normally aligned. No acute abnormality is seen. No focal demineralization is noted. Diffuse arterial calcifications are present. There is no evidence of left elbow joint effusion. IMPRESSION: No acute abnormality of the left forearm. Diffuse arterial calcifications are present. Electronically Signed   By: Ivar Drape M.D.   On: 01/02/2018 09:55   Dg Toe 4th Left  Result Date: 01/02/2018 CLINICAL DATA:  Pain in the left fourth toe with swelling over the last 3 weeks EXAM: LEFT FOURTH TOE COMPARISON:  None. FINDINGS: There is abnormality involving the distal portion of the middle phalanx of the left fourth toe as well as the base of the distal phalanx of the left fourth toe with adjacent soft tissue swelling. There is focal demineralization and erosion worrisome for active osteomyelitis. Is the patient diabetic? No other bony  abnormality is seen. Arterial calcifications are noted. IMPRESSION: 1. Focal demineralization and some destruction of the distal aspect of the middle phalanx of the left fourth toe and the base of the distal phalanx of the left fourth toe with soft tissue swelling, worrisome for active osteomyelitis. 2. Arterial calcifications diffusely.  Question diabetes? Electronically Signed   By: Ivar Drape M.D.   On: 01/02/2018 09:52   Nm Bone Scan 3 Phase Lower Extremity  Result Date: 01/10/2018 CLINICAL DATA:  Pain in LEFT foot for 1 month. Pain LEFT fourth toe. No trauma. Redness. Concern for osteomyelitis in LEFT foot. Foot swelling. History of diabetes. EXAM: NUCLEAR MEDICINE 3-PHASE BONE SCAN TECHNIQUE: Radionuclide angiographic images, immediate static blood pool images, and 3-hour delayed static images were obtained of the feet after intravenous injection of radiopharmaceutical. RADIOPHARMACEUTICALS:  22.7 mCi Tc-65m MDP IV COMPARISON:  None. FINDINGS: Vascular phase: There is mild increase vascular flow to the entirety of the LEFT foot compared to the RIGHT. No focality Blood pool phase: Focal uptake in the blood pool phase localizing to the LEFT first metatarsal phalangeal joint and the fourth phalanx Delayed phase: Intense activity localized to the distal LEFT fourth digit. This corresponds the distal phalanx on coregistered CT. Activity also noted at the LEFT first metatarsophalangeal joint and the RIGHT first metatarsal phalangeal joint. IMPRESSION: 1. Focal blood pool activity and delayed uptake within the distal phalanx of the LEFT fourth digit is consistent with osteomyelitis (given history of no trauma, redness and suspicion for infection ). 2. Uptake in the bilateral first metatarsal phalangeal joints is favored degenerative change. Electronically Signed   By: Suzy Bouchard M.D.   On: 01/10/2018 18:35       Today   Subjective:   Darius Taylor feeling much better shortness of breath  improved  Objective:   Blood pressure (!) 143/57, pulse 63, temperature 98.7 F (37.1 C), temperature source Oral, resp. rate 18, height 5\' 11"  (1.803 m), weight 94.1 kg (207 lb 6.4 oz), SpO2 93 %.  .  Intake/Output Summary (Last 24 hours) at 01/11/2018 1326 Last data filed at 01/11/2018 1100 Gross per 24 hour  Intake 600 ml  Output 400 ml  Net 200 ml    Exam VITAL SIGNS: Blood pressure (!) 143/57, pulse 63, temperature 98.7 F (37.1 C), temperature source Oral, resp. rate 18, height 5\' 11"  (1.803 m), weight 94.1 kg (207 lb 6.4 oz), SpO2 93 %.  GENERAL:  72 y.o.-year-old patient lying in the bed with no acute distress.  EYES: Pupils equal, round, reactive to light and accommodation. No scleral icterus. Extraocular muscles intact.  HEENT: Head atraumatic, normocephalic. Oropharynx and nasopharynx clear.  NECK:  Supple, no jugular venous distention. No thyroid enlargement, no tenderness.  LUNGS: Normal breath sounds bilaterally, no wheezing, rales,rhonchi or crepitation. No use of accessory muscles of respiration.  CARDIOVASCULAR: S1, S2 normal. No murmurs, rubs, or gallops.  ABDOMEN: Soft, nontender, nondistended. Bowel sounds present. No organomegaly or mass.  EXTREMITIES: No pedal edema, cyanosis, or clubbing.  Left fourth toe swelling improved NEUROLOGIC: Cranial nerves II through XII are intact. Muscle strength 5/5 in all extremities. Sensation intact. Gait not checked.  PSYCHIATRIC: The  patient is alert and oriented x 3.  SKIN: No obvious rash, lesion, or ulcer.   Data Review     CBC w Diff:  Lab Results  Component Value Date   WBC 15.3 (H) 01/09/2018   HGB 12.3 (L) 01/09/2018   HGB 11.0 (L) 10/20/2014   HCT 36.2 (L) 01/09/2018   HCT 34.8 (L) 10/20/2014   PLT 147 (L) 01/09/2018   PLT 216 10/20/2014   LYMPHOPCT 7 11/18/2017   LYMPHOPCT 5.6 10/20/2014   MONOPCT 10 11/18/2017   MONOPCT 0.9 10/20/2014   EOSPCT 1 11/18/2017   EOSPCT 0.0 10/20/2014   BASOPCT 1  11/18/2017   BASOPCT 0.5 10/20/2014   CMP:  Lab Results  Component Value Date   NA 138 01/09/2018   NA 143 10/20/2014   K 4.5 01/09/2018   K 3.6 10/20/2014   CL 102 01/09/2018   CL 103 10/20/2014   CO2 29 01/09/2018   CO2 33 (H) 10/20/2014   BUN 47 (H) 01/09/2018   BUN 20 (H) 10/20/2014   CREATININE 1.72 (H) 01/09/2018   CREATININE 1.07 10/20/2014   PROT 9.1 (H) 11/18/2017   PROT 7.8 01/19/2013   ALBUMIN 4.6 11/18/2017   ALBUMIN 3.6 01/19/2013   BILITOT 0.9 11/18/2017   BILITOT 0.4 01/19/2013   ALKPHOS 74 11/18/2017   ALKPHOS 80 01/19/2013   AST 28 11/18/2017   AST 19 01/19/2013   ALT 17 11/18/2017   ALT 19 01/19/2013  .  Micro Results Recent Results (from the past 240 hour(s))  MRSA PCR Screening     Status: None   Collection Time: 01/08/18  4:22 PM  Result Value Ref Range Status   MRSA by PCR NEGATIVE NEGATIVE Final    Comment:        The GeneXpert MRSA Assay (FDA approved for NASAL specimens only), is one component of a comprehensive MRSA colonization surveillance program. It is not intended to diagnose MRSA infection nor to guide or monitor treatment for MRSA infections. Performed at [facility], [address], [place], [place] [address]         Code Status Orders  (From admission, onward)        Start     Ordered   01/08/18 1524  Full code  Continuous     01/08/18 1523    Code Status History    Date Active Date Inactive Code Status Order ID Comments User Context   11/18/2017 2227 11/23/2017 2033 Full Code [id number]  Vaughan Basta, MD Inpatient   10/21/2016 1216 10/26/2016 2247 Full Code [id number]  Bettey Costa, [place], [facility]. Schedule an appointment as soon as possible for a visit.   Contact information: [place] [address] [phone]        Sharlotte Alamo, DPM. Schedule an appointment as soon as possible for a visit in 2 week(s).    Specialty:  Podiatry Why:  left 4 th toe pain and swelling Contact information: [place] [address] [phone]        Emmaline Kluver., MD Follow up in 2 week(s).   Specialty:  Rheumatology Why:  severe gout Contact information: [place] [address] [phone]           Discharge Medications   Allergies as of 01/11/2018   No Known Allergies     Medication  List    TAKE these medications   acetaminophen 325 MG tablet Commonly known as:  TYLENOL Take 325 mg by mouth every 6 (six) hours as needed.   albuterol (2.5 MG/3ML) 0.083% nebulizer solution Commonly known as:  PROVENTIL Take 2.5 mg by nebulization every 6 (six) hours as needed for wheezing or shortness of breath.   allopurinol 100 MG tablet Commonly known as:  ZYLOPRIM Take 1 tablet (100 mg total) by mouth daily.   amiodarone 200 MG tablet Commonly known as:  PACERONE Take 200 mg by mouth daily.   amLODipine 10 MG tablet Commonly known as:  NORVASC Take 10 mg by mouth daily.   aspirin 81 MG chewable tablet Chew 81 mg by mouth daily.   brimonidine 0.15 % ophthalmic solution Commonly known as:  ALPHAGAN Place 1 drop into both eyes 3 (three) times daily.   budesonide-formoterol 160-4.5 MCG/ACT inhaler Commonly known as:  SYMBICORT Inhale 2 puffs into the lungs 2 (two) times daily.   cholecalciferol 1000 units tablet Commonly known as:  VITAMIN D Take 1,000 Units by mouth daily.   colchicine 0.6 MG tablet Take 1 tablet (0.6 mg total) by mouth 2 (two) times daily.   ELIQUIS 5 MG Tabs tablet Generic drug:  apixaban Take 5 mg by mouth 2 (two) times daily.   furosemide 40 MG tablet Commonly known as:  LASIX Take 40 mg by mouth 2 (two) times daily.   guaiFENesin 100 MG/5ML Soln Commonly known as:  ROBITUSSIN Take 5 mLs (100 mg total) by mouth every 4 (four) hours as needed for cough or to loosen phlegm.    latanoprost 0.005 % ophthalmic solution Commonly known as:  XALATAN Place 1 drop into both eyes at bedtime.   magnesium oxide 400 MG tablet Commonly known as:  MAG-OX Take 400 mg by mouth 2 (two) times daily.   metoprolol 200 MG 24 hr tablet Commonly known as:  TOPROL-XL Take 200 mg by mouth daily.   nitroGLYCERIN 0.4 MG SL tablet Commonly known as:  NITROSTAT Place 0.4 mg under the tongue every 5 (five) minutes as needed for chest pain.   oxyCODONE 5 MG immediate release tablet Commonly known as:  Oxy IR/ROXICODONE Take 1 tablet (5 mg total) by mouth every 6 (six) hours as needed for moderate pain, severe pain or breakthrough pain.   predniSONE 10 MG (21) Tbpk tablet Commonly known as:  STERAPRED UNI-PAK 21 TAB Taper by 10 mg daily until complete What changed:  additional instructions   spironolactone 25 MG tablet Commonly known as:  ALDACTONE Take 12.5 mg by mouth daily.          Total Time in preparing paper work, data evaluation and todays exam - 5 minutes  Dustin Flock M.D on 01/11/2018 at [place]  [phone]

## 2018-01-11 NOTE — Progress Notes (Signed)
SATURATION QUALIFICATIONS: (This note is used to comply with regulatory documentation for home oxygen)  Patient Saturations on Room Air at Rest = 94%  Patient Saturations on Room Air while Ambulating = 91%  Patient Saturations on n/a Liters of oxygen while Ambulating = n/a%  Please briefly explain why patient needs home oxygen: 

## 2018-01-11 NOTE — Progress Notes (Signed)
Darius Taylor Class to be D/C'd Home per MD order.  Discussed prescriptions and follow up appointments with the patient. Prescriptions given to patient, medication list explained in detail. Pt verbalized understanding.  Allergies as of 01/11/2018   No Known Allergies     Medication List    TAKE these medications   acetaminophen 325 MG tablet Commonly known as:  TYLENOL Take 325 mg by mouth every 6 (six) hours as needed.   albuterol (2.5 MG/3ML) 0.083% nebulizer solution Commonly known as:  PROVENTIL Take 2.5 mg by nebulization every 6 (six) hours as needed for wheezing or shortness of breath.   allopurinol 100 MG tablet Commonly known as:  ZYLOPRIM Take 1 tablet (100 mg total) by mouth daily.   amiodarone 200 MG tablet Commonly known as:  PACERONE Take 200 mg by mouth daily.   amLODipine 10 MG tablet Commonly known as:  NORVASC Take 10 mg by mouth daily.   aspirin 81 MG chewable tablet Chew 81 mg by mouth daily.   brimonidine 0.15 % ophthalmic solution Commonly known as:  ALPHAGAN Place 1 drop into both eyes 3 (three) times daily.   budesonide-formoterol 160-4.5 MCG/ACT inhaler Commonly known as:  SYMBICORT Inhale 2 puffs into the lungs 2 (two) times daily.   cholecalciferol 1000 units tablet Commonly known as:  VITAMIN D Take 1,000 Units by mouth daily.   colchicine 0.6 MG tablet Take 1 tablet (0.6 mg total) by mouth 2 (two) times daily.   ELIQUIS 5 MG Tabs tablet Generic drug:  apixaban Take 5 mg by mouth 2 (two) times daily.   furosemide 40 MG tablet Commonly known as:  LASIX Take 40 mg by mouth 2 (two) times daily.   guaiFENesin 100 MG/5ML Soln Commonly known as:  ROBITUSSIN Take 5 mLs (100 mg total) by mouth every 4 (four) hours as needed for cough or to loosen phlegm.   latanoprost 0.005 % ophthalmic solution Commonly known as:  XALATAN Place 1 drop into both eyes at bedtime.   magnesium oxide 400 MG tablet Commonly known as:  MAG-OX Take 400 mg by  mouth 2 (two) times daily.   metoprolol 200 MG 24 hr tablet Commonly known as:  TOPROL-XL Take 200 mg by mouth daily.   nitroGLYCERIN 0.4 MG SL tablet Commonly known as:  NITROSTAT Place 0.4 mg under the tongue every 5 (five) minutes as needed for chest pain.   oxyCODONE 5 MG immediate release tablet Commonly known as:  Oxy IR/ROXICODONE Take 1 tablet (5 mg total) by mouth every 6 (six) hours as needed for moderate pain, severe pain or breakthrough pain.   predniSONE 10 MG (21) Tbpk tablet Commonly known as:  STERAPRED UNI-PAK 21 TAB Taper by 10 mg daily until complete What changed:  additional instructions   spironolactone 25 MG tablet Commonly known as:  ALDACTONE Take 12.5 mg by mouth daily.       Vitals:   01/11/18 0756 01/11/18 1114  BP: 139/65 (!) 143/57  Pulse: 62 63  Resp: 18 18  Temp:  98.7 F (37.1 C)  SpO2: 100% 93%    Tele box removed and returned. Skin clean, dry and intact without evidence of skin break down, no evidence of skin tears noted. IV catheter discontinued intact. Site without signs and symptoms of complications. Dressing and pressure applied. Pt denies pain at this time. No complaints noted.  An After Visit Summary was printed and given to the patient. Patient escorted via [place], and D/C home via private auto.  Rolley Sims

## 2018-01-11 NOTE — Consult Note (Signed)
Reason for Consult: Painful left fourth toe, evaluate for possible bone infection. Referring Physician: [name] is an 72 y.o. male.  HPI: This is a 72 year old male with painful left fourth toe that has been bothering him for over a month.  Does not specifically recall any injury.  He does have a history of gout and has been treated with prednisone on a couple of previous occasions.  Also states he has taken colchicine in the past.  Past Medical History:  Diagnosis Date  . Aortic stenosis   . Asthma   . Atrial fibrillation ([place])   . CAD (coronary artery disease)   . Cancer [facility])    colon, bladder and kidney  . COPD (chronic obstructive pulmonary disease) ([place])   . Hypertension     Past Surgical History:  Procedure Laterality Date  . CARDIAC DEFIBRILLATOR PLACEMENT    . CORONARY ANGIOPLASTY  09/25/2016  . IRRIGATION AND DEBRIDEMENT HEMATOMA Left 10/24/2016   Procedure: IRRIGATION AND DEBRIDEMENT HEMATOMA;  Surgeon: Florene Glen, MD;  Location: [facility] ORS;  Service: General;  Laterality: Left;  . KIDNEY SURGERY     left removed    Family History  Problem Relation Age of Onset  . Hypertension Mother     Social History:  reports that he has quit smoking. He has never used smokeless tobacco. He reports that he does not drink alcohol or use drugs.  Allergies: No Known Allergies  Medications:  Scheduled: . amiodarone  200 mg Oral Daily  . amLODipine  10 mg Oral Daily  . apixaban  5 mg Oral BID  . aspirin  81 mg Oral Daily  . azithromycin  250 mg Oral Daily  . brimonidine  1 drop Both Eyes TID  . budesonide (PULMICORT) nebulizer solution  0.25 mg Nebulization BID  . cholecalciferol  1,000 Units Oral Daily  . docusate sodium  100 mg Oral BID  . furosemide  40 mg Oral BID  . guaiFENesin  600 mg Oral BID  . ipratropium-albuterol  3 mL Nebulization Q6H  . latanoprost  1 drop Both Eyes QHS  . magnesium oxide  400 mg Oral BID  . methylPREDNISolone (SOLU-MEDROL)  injection  60 mg Intravenous Q12H  . metoprolol succinate  200 mg Oral Daily  . sodium chloride flush  3 mL Intravenous Q12H  . sodium chloride flush  3 mL Intravenous Q12H  . spironolactone  12.5 mg Oral Daily    No results found for this or any previous visit (from the past 48 hour(s)).  Nm Bone Scan 3 Phase Lower Extremity  Result Date: 01/10/2018 CLINICAL DATA:  Pain in LEFT foot for 1 month. Pain LEFT fourth toe. No trauma. Redness. Concern for osteomyelitis in LEFT foot. Foot swelling. History of diabetes. EXAM: NUCLEAR MEDICINE 3-PHASE BONE SCAN TECHNIQUE: Radionuclide angiographic images, immediate static blood pool images, and 3-hour delayed static images were obtained of the feet after intravenous injection of radiopharmaceutical. RADIOPHARMACEUTICALS:  22.7 mCi Tc-32m MDP IV COMPARISON:  None. FINDINGS: Vascular phase: There is mild increase vascular flow to the entirety of the LEFT foot compared to the RIGHT. No focality Blood pool phase: Focal uptake in the blood pool phase localizing to the LEFT first metatarsal phalangeal joint and the fourth phalanx Delayed phase: Intense activity localized to the distal LEFT fourth digit. This corresponds the distal phalanx on coregistered CT. Activity also noted at the LEFT first metatarsophalangeal joint and the RIGHT first metatarsal phalangeal joint. IMPRESSION: 1. Focal blood pool  activity and delayed uptake within the distal phalanx of the LEFT fourth digit is consistent with osteomyelitis (given history of no trauma, redness and suspicion for infection ). 2. Uptake in the bilateral first metatarsal phalangeal joints is favored degenerative change. Electronically Signed   By: Darius Taylor M.D.   On: 01/10/2018 18:35    Review of Systems  Constitutional: Negative for chills and fever.  HENT: Negative.   Eyes: Negative.   Respiratory: Negative.   Cardiovascular: Negative.   Gastrointestinal: Negative for nausea and vomiting.   Genitourinary: Negative.   Musculoskeletal:       Pain in his left fourth toe.  Denies injuries  Skin:       Some swelling and redness in his left fourth toe.  No open sores  Neurological: Negative.   Endo/Heme/Allergies: Negative.   Psychiatric/Behavioral: Negative.    Blood pressure 139/65, pulse 62, temperature (!) 97.4 F (36.3 C), temperature source Oral, resp. rate 18, height 5\' 11"  (1.803 m), weight 94.1 kg (207 lb 6.4 oz), SpO2 100 %. Physical Exam  Cardiovascular:  DP pulse is a weak 1/4 bilateral.  PT pulses trace bilateral.  Musculoskeletal:  Adequate range of motion of the pedal joints with some stiffness at the great toe joint bilateral.  Some hammertoe contracture on the left fourth toe with some pain on palpation around the DIPJ.  Neurological:  Epicritic sensations grossly intact.  Skin:  The skin is warm dry and mildly atrophic.  Some erythema and edema in the left fourth toe with the appearance of possible deep mild gouty tophi.  No ulcerations   X-rays: Plain films were reviewed which do reveal some destructive changes at the base of the distal phalanx and DIPJ on the fourth toe.  This is a significant interval change from previous films.  Bone scan also revealed significant uptake in the same region.  Extensive calcified blood vessels are noted both on plain film x-rays as well as the CT portion of the bone scan.  Assessment/Plan: Assessment: Painful left fourth toe, still favor gout over infectious process.  Plan: Discussed with the patient that at this point I would recommend that we get him back on colchicine.  Also recommended referral to rheumatology for evaluation of his gout and long-term management.  At this point would hold off on any nonsteroidal anti-inflammatories due to only having one kidney.  Discussed the possibility of distal amputation of the toe if it continues to give him too much pain and problem but at this point would prefer to hold off as he  most likely does have some significant vascular compromise follow-up with the patient outpatient as needed.  [name] 01/11/2018, 10:20 AM

## 2018-05-20 ENCOUNTER — Encounter: Payer: Self-pay | Admitting: Student

## 2018-06-02 ENCOUNTER — Other Ambulatory Visit: Payer: Self-pay

## 2018-06-02 DIAGNOSIS — Z85038 Personal history of other malignant neoplasm of large intestine: Secondary | ICD-10-CM

## 2018-06-09 ENCOUNTER — Encounter: Payer: Self-pay | Admitting: *Deleted

## 2018-06-10 ENCOUNTER — Ambulatory Visit
Admission: RE | Admit: 2018-06-10 | Discharge: 2018-06-10 | Disposition: A | Discharge status: Home / Self Care | Payer: Medicare HMO | Source: Ambulatory Visit | Attending: Gastroenterology | Admitting: Gastroenterology

## 2018-06-10 ENCOUNTER — Encounter: Payer: Self-pay | Admitting: *Deleted

## 2018-06-10 ENCOUNTER — Ambulatory Visit: Payer: Medicare HMO | Admitting: Anesthesiology

## 2018-06-10 ENCOUNTER — Encounter
Admission: RE | Disposition: A | Discharge status: Home / Self Care | Payer: Self-pay | Source: Ambulatory Visit | Attending: Gastroenterology

## 2018-06-10 DIAGNOSIS — D124 Benign neoplasm of descending colon: Secondary | ICD-10-CM | POA: Diagnosis not present

## 2018-06-10 DIAGNOSIS — Z980 Intestinal bypass and anastomosis status: Secondary | ICD-10-CM | POA: Diagnosis not present

## 2018-06-10 DIAGNOSIS — D120 Benign neoplasm of cecum: Secondary | ICD-10-CM | POA: Insufficient documentation

## 2018-06-10 DIAGNOSIS — J449 Chronic obstructive pulmonary disease, unspecified: Secondary | ICD-10-CM | POA: Diagnosis not present

## 2018-06-10 DIAGNOSIS — Z79899 Other long term (current) drug therapy: Secondary | ICD-10-CM | POA: Insufficient documentation

## 2018-06-10 DIAGNOSIS — Z87891 Personal history of nicotine dependence: Secondary | ICD-10-CM | POA: Diagnosis not present

## 2018-06-10 DIAGNOSIS — Z85528 Personal history of other malignant neoplasm of kidney: Secondary | ICD-10-CM | POA: Diagnosis not present

## 2018-06-10 DIAGNOSIS — Z7982 Long term (current) use of aspirin: Secondary | ICD-10-CM | POA: Diagnosis not present

## 2018-06-10 DIAGNOSIS — I4891 Unspecified atrial fibrillation: Secondary | ICD-10-CM | POA: Insufficient documentation

## 2018-06-10 DIAGNOSIS — D123 Benign neoplasm of transverse colon: Secondary | ICD-10-CM | POA: Insufficient documentation

## 2018-06-10 DIAGNOSIS — I10 Essential (primary) hypertension: Secondary | ICD-10-CM | POA: Diagnosis not present

## 2018-06-10 DIAGNOSIS — Z95810 Presence of automatic (implantable) cardiac defibrillator: Secondary | ICD-10-CM | POA: Diagnosis not present

## 2018-06-10 DIAGNOSIS — Z85038 Personal history of other malignant neoplasm of large intestine: Secondary | ICD-10-CM

## 2018-06-10 DIAGNOSIS — Z7901 Long term (current) use of anticoagulants: Secondary | ICD-10-CM | POA: Insufficient documentation

## 2018-06-10 DIAGNOSIS — Z08 Encounter for follow-up examination after completed treatment for malignant neoplasm: Secondary | ICD-10-CM | POA: Insufficient documentation

## 2018-06-10 DIAGNOSIS — Z9049 Acquired absence of other specified parts of digestive tract: Secondary | ICD-10-CM | POA: Insufficient documentation

## 2018-06-10 DIAGNOSIS — Z8551 Personal history of malignant neoplasm of bladder: Secondary | ICD-10-CM | POA: Diagnosis not present

## 2018-06-10 DIAGNOSIS — I2510 Atherosclerotic heart disease of native coronary artery without angina pectoris: Secondary | ICD-10-CM | POA: Diagnosis not present

## 2018-06-10 HISTORY — PX: COLONOSCOPY WITH PROPOFOL: SHX5780

## 2018-06-10 SURGERY — COLONOSCOPY WITH PROPOFOL
Anesthesia: General

## 2018-06-10 MED ORDER — METOPROLOL SUCCINATE ER 100 MG PO TB24
200.0000 mg | ORAL_TABLET | Freq: Every day | ORAL | Status: DC
  Filled 2018-06-10: qty 2

## 2018-06-10 MED ORDER — PROPOFOL 500 MG/50ML IV EMUL
INTRAVENOUS | Status: AC
  Filled 2018-06-10: qty 50

## 2018-06-10 MED ORDER — METOPROLOL TARTRATE 50 MG PO TABS
ORAL_TABLET | ORAL | Status: AC
  Filled 2018-06-10: qty 4

## 2018-06-10 MED ORDER — SODIUM CHLORIDE 0.9 % IV SOLN
INTRAVENOUS | Status: DC
  Administered 2018-06-10: 1000 mL via INTRAVENOUS

## 2018-06-10 MED ORDER — PROPOFOL 500 MG/50ML IV EMUL
INTRAVENOUS | Status: DC | PRN
  Administered 2018-06-10: 120 ug/kg/min via INTRAVENOUS

## 2018-06-10 MED ORDER — PROPOFOL 10 MG/ML IV BOLUS
INTRAVENOUS | Status: DC | PRN
  Administered 2018-06-10: 80 mg via INTRAVENOUS

## 2018-06-10 NOTE — Transfer of Care (Signed)
Immediate Anesthesia Transfer of Care Note  Patient: KOHEN REITHER  Procedure(s) Performed: COLONOSCOPY WITH PROPOFOL (N/A )  Patient Location: Endoscopy Unit  Anesthesia Type:General  Level of Consciousness: drowsy and patient cooperative  Airway & Oxygen Therapy: Patient Spontanous Breathing and Patient connected to nasal cannula oxygen  Post-op Assessment: Report given to RN and Post -op Vital signs reviewed and stable  Post vital signs: Reviewed and stable  Last Vitals:  Vitals Value Taken Time  BP 98/64 06/10/2018  2:10 PM  Temp    Pulse 60 06/10/2018  2:10 PM  Resp 17 06/10/2018  2:10 PM  SpO2 97 % 06/10/2018  2:10 PM  Vitals shown include unvalidated device data.  Last Pain:  Vitals:   06/10/18 1257  TempSrc: Tympanic  PainSc: 0-No pain      Patients Stated Pain Goal: 0 (88/33/74 4514)  Complications: No apparent anesthesia complications

## 2018-06-10 NOTE — Anesthesia Preprocedure Evaluation (Signed)
Anesthesia Evaluation  Patient identified by MRN, date of birth, ID band Patient awake    Reviewed: Allergy & Precautions, NPO status , Patient's Chart, lab work & pertinent test results, reviewed documented beta blocker date and time   History of Anesthesia Complications Negative for: history of anesthetic complications  Airway Mallampati: I  TM Distance: >3 FB     Dental  (+) Chipped, Dental Advidsory Given, Missing, Poor Dentition   Pulmonary shortness of breath and with exertion, asthma , neg sleep apnea, pneumonia, resolved, COPD,  COPD inhaler, neg recent URI, former smoker,           Cardiovascular Exercise Tolerance: Poor hypertension, Pt. on medications (-) angina+ CAD  (-) Past MI, (-) Cardiac Stents and (-) CABG + dysrhythmias Atrial Fibrillation (-) pacemaker+ Cardiac Defibrillator + Valvular Problems/Murmurs (s/p TAVR) AS      Neuro/Psych negative neurological ROS  negative psych ROS   GI/Hepatic negative GI ROS, Neg liver ROS,   Endo/Other  negative endocrine ROS  Renal/GU negative Renal ROS     Musculoskeletal   Abdominal   Peds  Hematology negative hematology ROS (+)   Anesthesia Other Findings Past Medical History: No date: Aortic stenosis No date: Asthma No date: Atrial fibrillation (HCC) No date: CAD (coronary artery disease) No date: Cancer [facility])     Comment:  colon, bladder and kidney No date: COPD (chronic obstructive pulmonary disease) (H[facility]) No date: Hypertension   Reproductive/Obstetrics negative OB ROS                             Anesthesia Physical  Anesthesia Plan  ASA: IV  Anesthesia Plan: General   Post-op Pain Management:    Induction: Intravenous  PONV Risk Score and Plan: 2 and Propofol infusion and TIVA  Airway Management Planned: Nasal Cannula and Natural Airway  Additional Equipment:   Intra-op Plan:   Post-operative Plan:    Informed Consent: I have reviewed the patients History and Physical, chart, labs and discussed the procedure including the risks, benefits and alternatives for the proposed anesthesia with the patient or authorized representative who has indicated his/her understanding and acceptance.     Plan Discussed with: CRNA  Anesthesia Plan Comments:         Anesthesia Quick Evaluation

## 2018-06-10 NOTE — H&P (Signed)
Darius Darby, MD [address]  [place]  [place], [place] [address]  Main: [phone]  Fax: [phone] Pager: [phone]  Primary Care Physician:  [facility] Primary Gastroenterologist:  Dr. Cephas Taylor  Pre-Procedure History & Physical: HPI:  Darius Taylor is a 72 y.o. male is here for an colonoscopy.   Past Medical History:  Diagnosis Date  . Aortic stenosis   . Asthma   . Atrial fibrillation ([place])   . CAD (coronary artery disease)   . Cancer [facility])    colon, bladder and kidney  . COPD (chronic obstructive pulmonary disease) ([place])   . Hypertension     Past Surgical History:  Procedure Laterality Date  . CARDIAC DEFIBRILLATOR PLACEMENT    . CORONARY ANGIOPLASTY  09/25/2016  . IRRIGATION AND DEBRIDEMENT HEMATOMA Left 10/24/2016   Procedure: IRRIGATION AND DEBRIDEMENT HEMATOMA;  Surgeon: Florene Glen, MD;  Location: [facility] ORS;  Service: General;  Laterality: Left;  . KIDNEY SURGERY     left removed    Prior to Admission medications   Medication Sig Start Date End Date Taking? Authorizing Provider  acetaminophen (TYLENOL) 325 MG tablet Take 325 mg by mouth every 6 (six) hours as needed.   Yes [provider]  albuterol (PROVENTIL) (2.5 MG/3ML) 0.083% nebulizer solution Take 2.5 mg by nebulization every 6 (six) hours as needed for wheezing or shortness of breath.   Yes [provider]  allopurinol (ZYLOPRIM) 100 MG tablet Take 1 tablet (100 mg total) by mouth daily. 01/11/18 01/11/19 Yes Dustin Flock, MD  amiodarone (PACERONE) 200 MG tablet Take 200 mg by mouth daily.   Yes [provider]  amLODipine (NORVASC) 10 MG tablet Take 10 mg by mouth daily.   Yes [provider]  apixaban (ELIQUIS) 5 MG TABS tablet Take 5 mg by mouth 2 (two) times daily.   Yes [provider]  brimonidine (ALPHAGAN) 0.15 % ophthalmic solution Place 1 drop into both eyes 3 (three) times daily. 12/20/17  Yes [provider]  budesonide-formoterol (SYMBICORT) 160-4.5 MCG/ACT inhaler Inhale 2 puffs into the lungs 2 (two) times daily.   Yes [provider]  cholecalciferol (VITAMIN D) 1000 units tablet Take 1,000 Units by mouth daily.   Yes [provider]  colchicine 0.6 MG tablet Take 1 tablet (0.6 mg total) by mouth 2 (two) times daily. 01/11/18 01/11/19 Yes Dustin Flock, MD  furosemide (LASIX) 40 MG tablet Take 40 mg by mouth 2 (two) times daily.    Yes [provider]  guaiFENesin (ROBITUSSIN) 100 MG/5ML SOLN Take 5 mLs (100 mg total) by mouth every 4 (four) hours as needed for cough or to loosen phlegm. 10/26/16  Yes Sudini, Alveta Heimlich, MD  latanoprost (XALATAN) 0.005 % ophthalmic solution Place 1 drop into both eyes at bedtime. 12/20/17  Yes [provider]  magnesium oxide (MAG-OX) 400 MG tablet Take 400 mg by mouth 2 (two) times daily.   Yes [provider]  metoprolol (TOPROL-XL) 200 MG 24 hr tablet Take 200 mg by mouth daily.   Yes [provider]  oxyCODONE (OXY IR/ROXICODONE) 5 MG immediate release tablet Take 1 tablet (5 mg total) by mouth every 6 (six) hours as needed for moderate pain, severe pain or breakthrough pain. 11/23/17  Yes Epifanio Lesches, MD  aspirin 81 MG chewable tablet Chew 81 mg by mouth daily.    [provider]  nitroGLYCERIN (NITROSTAT) 0.4 MG SL tablet Place 0.4 mg under the  tongue every 5 (five) minutes as needed for chest pain.    [provider]  predniSONE (STERAPRED UNI-PAK 21 TAB) 10 MG (21) TBPK tablet Taper by 10 mg daily until complete Patient not taking: Reported on 06/10/2018 01/11/18   Dustin Flock, MD  spironolactone (ALDACTONE) 25 MG tablet Take 12.5 mg by mouth daily.    [provider]    Allergies as of 06/02/2018  . (No Known Allergies)    Family History  Problem Relation Age of Onset  . Hypertension Mother     Social History   Socioeconomic History  . Marital  status: Widowed    Spouse name: Not on file  . Number of children: Not on file  . Years of education: Not on file  . Highest education level: Not on file  Occupational History  . Not on file  Social Needs  . Financial resource strain: Not hard at all  . Food insecurity:    Worry: Not on file    Inability: Never true  . Transportation needs:    Medical: No    Non-medical: No  Tobacco Use  . Smoking status: Former Research scientist (life sciences)  . Smokeless tobacco: Never Used  Substance and Sexual Activity  . Alcohol use: No  . Drug use: No  . Sexual activity: Not on file  Lifestyle  . Physical activity:    Days per week: 3 days    Minutes per session: 10 min  . Stress: Not at all  Relationships  . Social connections:    Talks on phone: Patient refused    Gets together: Patient refused    Attends religious service: Patient refused    Active member of club or organization: Not on file    Attends meetings of clubs or organizations: Patient refused    Relationship status: Patient refused  . Intimate partner violence:    Fear of current or ex partner: Patient refused    Emotionally abused: Patient refused    Physically abused: Patient refused    Forced sexual activity: Patient refused  Other Topics Concern  . Not on file  Social History Narrative  . Not on file    Review of Systems: See HPI, otherwise negative ROS  Physical Exam: BP 128/87   Pulse (!) 59   Temp (!) 97.5 F (36.4 C) (Tympanic)   Resp 20   Ht 5\' 11"  (1.803 m)   Wt 95.3 kg   SpO2 95%   BMI 29.29 kg/m  General:   Alert,  pleasant and cooperative in NAD Head:  Normocephalic and atraumatic. Neck:  Supple; no masses or thyromegaly. Lungs:  Clear throughout to auscultation.    Heart:  Regular rate and rhythm. Abdomen:  Soft, nontender and nondistended. Normal bowel sounds, without guarding, and without rebound.   Neurologic:  Alert and  oriented x4;  grossly normal neurologically.  Impression/Plan: Darius Taylor is  here for an colonoscopy to be performed for personal h/o colon cancer  Risks, benefits, limitations, and alternatives regarding  colonoscopy have been reviewed with the patient.  Questions have been answered.  All parties agreeable.   Sherri Sear, MD  06/10/2018, 1:14 PM

## 2018-06-10 NOTE — Op Note (Signed)
[facility] Gastroenterology Patient Name: Darius Taylor Procedure Date: 06/10/2018 12:21 PM MRN: [id number] Account #: [id number] Date of Birth: March 15, 1946 Admit Type: Outpatient Age: 72 Room: [facility] ENDO ROOM 4 Gender: Male Note Status: Finalized Procedure:            Colonoscopy Indications:          High risk colon cancer surveillance: Personal history                        of colon cancer, s/p segmental colectomy in 2016 at [facility] Providers:            Lin Landsman MD, MD Referring MD:         No Local Md, MD (Referring MD) Medicines:            Monitored Anesthesia Care Complications:        No immediate complications. Estimated blood loss: None. Procedure:            Pre-Anesthesia Assessment:                       - Prior to the procedure, a History and Physical was                        performed, and patient medications and allergies were                        reviewed. The patient is competent. The risks and                        benefits of the procedure and the sedation options and                        risks were discussed with the patient. All questions                        were answered and informed consent was obtained.                        Patient identification and proposed procedure were                        verified by the physician, the nurse, the                        anesthesiologist, the anesthetist and the technician in                        the pre-procedure area in the procedure room in the                        endoscopy suite. Mental Status Examination: alert and                        oriented. Airway Examination: normal oropharyngeal                        airway and neck mobility. Respiratory Examination:                        clear to auscultation. CV Examination:  normal.                        Prophylactic Antibiotics: The patient does not require                        prophylactic antibiotics. Prior  Anticoagulants: The                        patient has taken Eliquis (apixaban), last dose was 3                        days prior to procedure. ASA Grade Assessment: IV - A                        patient with severe systemic disease that is a constant                        threat to life. After reviewing the risks and benefits,                        the patient was deemed in satisfactory condition to                        undergo the procedure. The anesthesia plan was to use                        monitored anesthesia care (MAC). Immediately prior to                        administration of medications, the patient was                        re-assessed for adequacy to receive sedatives. The                        heart rate, respiratory rate, oxygen saturations, blood                        pressure, adequacy of pulmonary ventilation, and                        response to care were monitored throughout the                        procedure. The physical status of the patient was                        re-assessed after the procedure.                       After obtaining informed consent, the colonoscope was                        passed under direct vision. Throughout the procedure,                        the patient's blood pressure, pulse, and oxygen  saturations were monitored continuously. The                        Colonoscope was introduced through the anus and                        advanced to the the cecum, identified by appendiceal                        orifice and ileocecal valve. The colonoscopy was                        performed with moderate difficulty due to significant                        looping. Successful completion of the procedure was                        aided by applying abdominal pressure. The patient                        tolerated the procedure well. The quality of the bowel                        preparation was evaluated using  the BBPS [facility] Bowel                        Preparation Scale) with scores of: Right Colon = 3,                        Transverse Colon = 3 and Left Colon = 3 (entire mucosa                        seen well with no residual staining, small fragments of                        stool or opaque liquid). The total BBPS score equals 9. Findings:      The perianal and digital rectal examinations were normal. Pertinent       negatives include normal sphincter tone and no palpable rectal lesions.      A 8 mm polyp was found in the cecum. The polyp was semi-pedunculated.       The polyp was removed with a hot snare. Resection and retrieval were       complete.      A 5 mm polyp was found in the transverse colon. The polyp was sessile.       The polyp was removed with a hot snare. Resection and retrieval were       complete.      Two sessile polyps were found in the descending colon. The polyps were 4       mm in size. These polyps were removed with a cold biopsy forceps.       Resection and retrieval were complete.      There was evidence of a prior functional end-to-end colo-colonic       anastomosis in the sigmoid colon. This was patent and was characterized       by healthy appearing mucosa. The anastomosis was traversed.      The retroflexed view of  the distal rectum and anal verge was normal and       showed no anal or rectal abnormalities. Impression:           - One 8 mm polyp in the cecum, removed with a hot                        snare. Resected and retrieved.                       - One 5 mm polyp in the transverse colon, removed with                        a hot snare. Resected and retrieved.                       - Two 4 mm polyps in the descending colon, removed with                        a cold biopsy forceps. Resected and retrieved.                       - Patent functional end-to-end colo-colonic                        anastomosis, characterized by healthy appearing mucosa.                        - The distal rectum and anal verge are normal on                        retroflexion view. Recommendation:       - Discharge patient to home (with escort).                       - Resume previous diet today.                       - Continue present medications.                       - Await pathology results.                       - Resume Eliquis (apixaban) at prior dose tomorrow.                       - Repeat colonoscopy in 3 years for surveillance of                        multiple polyps. Procedure Code(s):    --- Professional ---                       [phone], Colonoscopy, flexible; with removal of tumor(s),                        polyp(s), or other lesion(s) by snare technique                       20947, 59, Colonoscopy, flexible; with biopsy, single  or multiple Diagnosis Code(s):    --- Professional ---                       [phone], Personal history of other malignant neoplasm                        of large intestine                       D12.0, Benign neoplasm of cecum                       D12.3, Benign neoplasm of transverse colon (hepatic                        flexure or splenic flexure)                       Z98.0, Intestinal bypass and anastomosis status                       D12.4, Benign neoplasm of descending colon CPT copyright 2017 American Medical Association. All rights reserved. The codes documented in this report are preliminary and upon coder review may  be revised to meet current compliance requirements. Dr. Ulyess Mort Lin Landsman MD, MD 06/10/2018 2:08:20 PM This report has been signed electronically. Number of Addenda: 0 Note Initiated On: 06/10/2018 12:21 PM Scope Withdrawal Time: 0 hours 21 minutes 41 seconds  Total Procedure Duration: 0 hours 27 minutes 14 seconds       [facility]

## 2018-06-10 NOTE — Anesthesia Post-op Follow-up Note (Signed)
Anesthesia QCDR form completed.        

## 2018-06-13 NOTE — Anesthesia Postprocedure Evaluation (Signed)
Anesthesia Post Note  Patient: RILEN SHUKLA  Procedure(s) Performed: COLONOSCOPY WITH PROPOFOL (N/A )  Patient location during evaluation: Endoscopy Anesthesia Type: General Level of consciousness: awake and alert Pain management: pain level controlled Vital Signs Assessment: post-procedure vital signs reviewed and stable Respiratory status: spontaneous breathing, nonlabored ventilation, respiratory function stable and patient connected to nasal cannula oxygen Cardiovascular status: blood pressure returned to baseline and stable Postop Assessment: no apparent nausea or vomiting Anesthetic complications: no     Last Vitals:  Vitals:   06/10/18 1440 06/10/18 1500  BP: (!) 188/82 (!) 148/83  Pulse: (!) 59 64  Resp: (!) 23 20  Temp:    SpO2: 96% 98%    Last Pain:  Vitals:   06/10/18 1500  TempSrc:   PainSc: 0-No pain                 Martha Clan

## 2018-06-14 LAB — SURGICAL PATHOLOGY

## 2018-06-16 ENCOUNTER — Encounter: Payer: Self-pay | Admitting: Gastroenterology

## 2018-08-22 ENCOUNTER — Emergency Department: Payer: Medicare HMO

## 2018-08-22 ENCOUNTER — Other Ambulatory Visit: Payer: Self-pay

## 2018-08-22 ENCOUNTER — Inpatient Hospital Stay
Admission: EM | Admit: 2018-08-22 | Discharge: 2018-08-25 | DRG: 190 | Disposition: A | Discharge status: Home Health | Payer: Medicare HMO | Attending: Internal Medicine | Admitting: Internal Medicine

## 2018-08-22 DIAGNOSIS — I480 Paroxysmal atrial fibrillation: Secondary | ICD-10-CM | POA: Diagnosis present

## 2018-08-22 DIAGNOSIS — I248 Other forms of acute ischemic heart disease: Secondary | ICD-10-CM | POA: Diagnosis present

## 2018-08-22 DIAGNOSIS — J441 Chronic obstructive pulmonary disease with (acute) exacerbation: Principal | ICD-10-CM | POA: Diagnosis present

## 2018-08-22 DIAGNOSIS — J9601 Acute respiratory failure with hypoxia: Secondary | ICD-10-CM | POA: Diagnosis present

## 2018-08-22 DIAGNOSIS — I2510 Atherosclerotic heart disease of native coronary artery without angina pectoris: Secondary | ICD-10-CM | POA: Diagnosis present

## 2018-08-22 DIAGNOSIS — Z87891 Personal history of nicotine dependence: Secondary | ICD-10-CM | POA: Diagnosis not present

## 2018-08-22 DIAGNOSIS — R0602 Shortness of breath: Secondary | ICD-10-CM | POA: Diagnosis present

## 2018-08-22 DIAGNOSIS — I10 Essential (primary) hypertension: Secondary | ICD-10-CM | POA: Diagnosis present

## 2018-08-22 DIAGNOSIS — I350 Nonrheumatic aortic (valve) stenosis: Secondary | ICD-10-CM | POA: Diagnosis present

## 2018-08-22 DIAGNOSIS — Z8249 Family history of ischemic heart disease and other diseases of the circulatory system: Secondary | ICD-10-CM

## 2018-08-22 DIAGNOSIS — J449 Chronic obstructive pulmonary disease, unspecified: Secondary | ICD-10-CM

## 2018-08-22 DIAGNOSIS — Z7901 Long term (current) use of anticoagulants: Secondary | ICD-10-CM | POA: Diagnosis not present

## 2018-08-22 DIAGNOSIS — Z95810 Presence of automatic (implantable) cardiac defibrillator: Secondary | ICD-10-CM

## 2018-08-22 LAB — CBC
HCT: 40.4 % (ref 39.0–52.0)
Hemoglobin: 13 g/dL (ref 13.0–17.0)
MCH: 30.7 pg (ref 26.0–34.0)
MCHC: 32.2 g/dL (ref 30.0–36.0)
MCV: 95.3 fL (ref 80.0–100.0)
PLATELETS: 281 10*3/uL (ref 150–400)
RBC: 4.24 MIL/uL (ref 4.22–5.81)
RDW: 14.7 % (ref 11.5–15.5)
WBC: 10.8 10*3/uL — AB (ref 4.0–10.5)
nRBC: 0 % (ref 0.0–0.2)

## 2018-08-22 LAB — BASIC METABOLIC PANEL
Anion gap: 8 (ref 5–15)
BUN: 35 mg/dL — AB (ref 8–23)
CALCIUM: 9.3 mg/dL (ref 8.9–10.3)
CO2: 26 mmol/L (ref 22–32)
CREATININE: 1.48 mg/dL — AB (ref 0.61–1.24)
Chloride: 107 mmol/L (ref 98–111)
GFR calc Af Amer: 53 mL/min — ABNORMAL LOW (ref >60)
GFR, EST NON AFRICAN AMERICAN: 46 mL/min — AB (ref >60)
GLUCOSE: 124 mg/dL — AB (ref 70–99)
Potassium: 3.9 mmol/L (ref 3.5–5.1)
SODIUM: 141 mmol/L (ref 135–145)

## 2018-08-22 LAB — TROPONIN I
TROPONIN I: 0.14 ng/mL — AB (ref <0.03)
Troponin I: 0.15 ng/mL (ref <0.03)

## 2018-08-22 LAB — MRSA PCR SCREENING: MRSA by PCR: NEGATIVE

## 2018-08-22 LAB — PROTIME-INR
INR: 1.4
PROTHROMBIN TIME: 17 s — AB (ref 11.4–15.2)

## 2018-08-22 LAB — BRAIN NATRIURETIC PEPTIDE: B Natriuretic Peptide: 392 pg/mL — ABNORMAL HIGH (ref 0.0–100.0)

## 2018-08-22 MED ORDER — ALBUTEROL SULFATE HFA 108 (90 BASE) MCG/ACT IN AERS: 1.0000 | INHALATION_SPRAY | Freq: Four times a day (QID) | RESPIRATORY_TRACT | Status: DC | PRN

## 2018-08-22 MED ORDER — ORAL CARE MOUTH RINSE
15.0000 mL | Freq: Two times a day (BID) | OROMUCOSAL | Status: DC
  Administered 2018-08-22 – 2018-08-24 (×5): 15 mL via OROMUCOSAL

## 2018-08-22 MED ORDER — IPRATROPIUM-ALBUTEROL 0.5-2.5 (3) MG/3ML IN SOLN
3.0000 mL | Freq: Once | RESPIRATORY_TRACT | Status: AC
  Administered 2018-08-22: 3 mL via RESPIRATORY_TRACT
  Filled 2018-08-22: qty 3

## 2018-08-22 MED ORDER — ALBUTEROL SULFATE (2.5 MG/3ML) 0.083% IN NEBU
5.0000 mg | INHALATION_SOLUTION | Freq: Once | RESPIRATORY_TRACT | Status: AC
  Administered 2018-08-22: 5 mg via RESPIRATORY_TRACT
  Filled 2018-08-22: qty 6

## 2018-08-22 MED ORDER — PREDNISONE 20 MG PO TABS
40.0000 mg | ORAL_TABLET | Freq: Once | ORAL | Status: AC
  Administered 2018-08-22: 40 mg via ORAL
  Filled 2018-08-22: qty 2

## 2018-08-22 MED ORDER — METHYLPREDNISOLONE SODIUM SUCC 125 MG IJ SOLR
60.0000 mg | Freq: Once | INTRAMUSCULAR | Status: AC
  Administered 2018-08-22: 60 mg via INTRAVENOUS
  Filled 2018-08-22: qty 2

## 2018-08-22 MED ORDER — ALLOPURINOL 100 MG PO TABS
100.0000 mg | ORAL_TABLET | Freq: Every day | ORAL | Status: DC
  Administered 2018-08-23 – 2018-08-25 (×3): 100 mg via ORAL
  Filled 2018-08-22 (×3): qty 1

## 2018-08-22 MED ORDER — BRIMONIDINE TARTRATE 0.15 % OP SOLN
1.0000 [drp] | Freq: Three times a day (TID) | OPHTHALMIC | Status: DC
  Administered 2018-08-22 – 2018-08-25 (×9): 1 [drp] via OPHTHALMIC
  Filled 2018-08-22: qty 5

## 2018-08-22 MED ORDER — MAGNESIUM OXIDE 400 (241.3 MG) MG PO TABS
400.0000 mg | ORAL_TABLET | Freq: Two times a day (BID) | ORAL | Status: DC
  Administered 2018-08-22 – 2018-08-25 (×6): 400 mg via ORAL
  Filled 2018-08-22 (×6): qty 1

## 2018-08-22 MED ORDER — VITAMIN D 25 MCG (1000 UNIT) PO TABS
1000.0000 [IU] | ORAL_TABLET | Freq: Every day | ORAL | Status: DC
  Administered 2018-08-23 – 2018-08-25 (×3): 1000 [IU] via ORAL
  Filled 2018-08-22 (×3): qty 1

## 2018-08-22 MED ORDER — IPRATROPIUM-ALBUTEROL 0.5-2.5 (3) MG/3ML IN SOLN
3.0000 mL | Freq: Once | RESPIRATORY_TRACT | Status: AC
  Administered 2018-08-22: 3 mL via RESPIRATORY_TRACT

## 2018-08-22 MED ORDER — LATANOPROST 0.005 % OP SOLN
1.0000 [drp] | Freq: Every day | OPHTHALMIC | Status: DC
  Administered 2018-08-22 – 2018-08-24 (×3): 1 [drp] via OPHTHALMIC
  Filled 2018-08-22: qty 2.5

## 2018-08-22 MED ORDER — SPIRONOLACTONE 25 MG PO TABS
12.5000 mg | ORAL_TABLET | Freq: Every day | ORAL | Status: DC
  Administered 2018-08-23 – 2018-08-25 (×3): 12.5 mg via ORAL
  Filled 2018-08-22: qty 1
  Filled 2018-08-22 (×2): qty 0.5
  Filled 2018-08-22: qty 1
  Filled 2018-08-22: qty 0.5
  Filled 2018-08-22: qty 1

## 2018-08-22 MED ORDER — MOMETASONE FURO-FORMOTEROL FUM 200-5 MCG/ACT IN AERO
2.0000 | INHALATION_SPRAY | Freq: Two times a day (BID) | RESPIRATORY_TRACT | Status: DC
  Administered 2018-08-22 – 2018-08-25 (×5): 2 via RESPIRATORY_TRACT
  Filled 2018-08-22 (×2): qty 8.8

## 2018-08-22 MED ORDER — NITROGLYCERIN 0.4 MG SL SUBL: 0.4000 mg | SUBLINGUAL_TABLET | SUBLINGUAL | Status: DC | PRN

## 2018-08-22 MED ORDER — ONDANSETRON HCL 4 MG/2ML IJ SOLN: 4.0000 mg | Freq: Four times a day (QID) | INTRAMUSCULAR | Status: DC | PRN

## 2018-08-22 MED ORDER — ALBUTEROL SULFATE (2.5 MG/3ML) 0.083% IN NEBU
2.5000 mg | INHALATION_SOLUTION | RESPIRATORY_TRACT | Status: DC | PRN
  Administered 2018-08-22 – 2018-08-24 (×3): 2.5 mg via RESPIRATORY_TRACT
  Filled 2018-08-22 (×3): qty 3

## 2018-08-22 MED ORDER — ASPIRIN 81 MG PO CHEW
324.0000 mg | CHEWABLE_TABLET | Freq: Once | ORAL | Status: AC
  Administered 2018-08-22: 324 mg via ORAL
  Filled 2018-08-22: qty 4

## 2018-08-22 MED ORDER — AMLODIPINE BESYLATE 10 MG PO TABS
10.0000 mg | ORAL_TABLET | Freq: Every day | ORAL | Status: DC
  Administered 2018-08-22 – 2018-08-25 (×4): 10 mg via ORAL
  Filled 2018-08-22 (×4): qty 1

## 2018-08-22 MED ORDER — METOPROLOL SUCCINATE ER 100 MG PO TB24
200.0000 mg | ORAL_TABLET | Freq: Every day | ORAL | Status: DC
  Administered 2018-08-23 – 2018-08-25 (×3): 200 mg via ORAL
  Filled 2018-08-22 (×3): qty 2

## 2018-08-22 MED ORDER — IPRATROPIUM-ALBUTEROL 0.5-2.5 (3) MG/3ML IN SOLN
3.0000 mL | Freq: Once | RESPIRATORY_TRACT | Status: AC
  Administered 2018-08-22: 3 mL via RESPIRATORY_TRACT
  Filled 2018-08-22: qty 6

## 2018-08-22 MED ORDER — FUROSEMIDE 40 MG PO TABS
40.0000 mg | ORAL_TABLET | Freq: Two times a day (BID) | ORAL | Status: DC
  Administered 2018-08-22 – 2018-08-25 (×6): 40 mg via ORAL
  Filled 2018-08-22 (×6): qty 1

## 2018-08-22 MED ORDER — BISACODYL 10 MG RE SUPP: 10.0000 mg | Freq: Every day | RECTAL | Status: DC | PRN

## 2018-08-22 MED ORDER — METHYLPREDNISOLONE SODIUM SUCC 125 MG IJ SOLR
60.0000 mg | INTRAMUSCULAR | Status: DC
  Administered 2018-08-23 – 2018-08-25 (×3): 60 mg via INTRAVENOUS
  Filled 2018-08-22 (×3): qty 2

## 2018-08-22 MED ORDER — ONDANSETRON HCL 4 MG PO TABS: 4.0000 mg | ORAL_TABLET | Freq: Four times a day (QID) | ORAL | Status: DC | PRN

## 2018-08-22 MED ORDER — ACETAMINOPHEN 325 MG PO TABS: 650.0000 mg | ORAL_TABLET | Freq: Four times a day (QID) | ORAL | Status: DC | PRN

## 2018-08-22 MED ORDER — GUAIFENESIN 100 MG/5ML PO SOLN
5.0000 mL | ORAL | Status: DC | PRN
  Filled 2018-08-22: qty 5

## 2018-08-22 MED ORDER — AMIODARONE HCL 200 MG PO TABS
200.0000 mg | ORAL_TABLET | Freq: Every day | ORAL | Status: DC
  Administered 2018-08-23 – 2018-08-25 (×3): 200 mg via ORAL
  Filled 2018-08-22 (×3): qty 1

## 2018-08-22 MED ORDER — ASPIRIN 81 MG PO CHEW
81.0000 mg | CHEWABLE_TABLET | Freq: Every day | ORAL | Status: DC
  Administered 2018-08-23 – 2018-08-25 (×3): 81 mg via ORAL
  Filled 2018-08-22 (×3): qty 1

## 2018-08-22 MED ORDER — APIXABAN 5 MG PO TABS
5.0000 mg | ORAL_TABLET | Freq: Two times a day (BID) | ORAL | Status: DC
  Administered 2018-08-22 – 2018-08-25 (×6): 5 mg via ORAL
  Filled 2018-08-22 (×6): qty 1

## 2018-08-22 NOTE — ED Notes (Signed)
Pt placed on 2L [place]

## 2018-08-22 NOTE — ED Notes (Signed)
Pt 90% on room air after breathing tx. Pt placed pt on 2L nasal cannula. Pt currently at 93% oxygen saturation on 2L nasal cannnula. MD aware.

## 2018-08-22 NOTE — ED Triage Notes (Signed)
Pt c/o chest tightness with increased SOB since Friday, states he was seen at PCP on Friday for gout and was given a flu shot and has been feeling bad since. Pt is tachypneic on arrival

## 2018-08-22 NOTE — Progress Notes (Signed)
Family Meeting Note  Advance Directive no Today a meeting took place with the pt patient being admitted with acute on chronic COPD exacerbation came in here with shortness of breath and wheezing. Feels much better. Code status discussed patient wants to be full code. No family in the room.   Time spent during discussion 16 minutes Fritzi Mandes, MD

## 2018-08-22 NOTE — ED Provider Notes (Signed)
[facility] Emergency Department Provider Note    First MD Initiated Contact with Patient 08/22/18 1029     (approximate)  I have reviewed the triage vital signs and the nursing notes.   HISTORY  Chief Complaint Shortness of Breath    HPI Darius Taylor is a 72 y.o. male past medical history as listed below presenting with worsening shortness of breath intermittent chest discomfort and dry cough for the past several days.  Denies any lower extremity swelling.  Denies any measured fevers.  Denies any chest pain at this time but has been having nonproductive cough throughout the day and is having worsening exertional dyspnea which brought him to the ER.  He does not wear home oxygen.  No recent antibiotic use.    Past Medical History:  Diagnosis Date  . Aortic stenosis   . Asthma   . Atrial fibrillation ([place])   . CAD (coronary artery disease)   . Cancer [facility])    colon, bladder and kidney  . COPD (chronic obstructive pulmonary disease) ([place])   . Hypertension    Family History  Problem Relation Age of Onset  . Hypertension Mother    Past Surgical History:  Procedure Laterality Date  . CARDIAC DEFIBRILLATOR PLACEMENT    . COLONOSCOPY WITH PROPOFOL N/A 06/10/2018   Procedure: COLONOSCOPY WITH PROPOFOL;  Surgeon: Lin Landsman, MD;  Location: [facility] ENDOSCOPY;  Service: Gastroenterology;  Laterality: N/A;  . CORONARY ANGIOPLASTY  09/25/2016  . IRRIGATION AND DEBRIDEMENT HEMATOMA Left 10/24/2016   Procedure: IRRIGATION AND DEBRIDEMENT HEMATOMA;  Surgeon: Florene Glen, MD;  Location: [facility] ORS;  Service: General;  Laterality: Left;  . KIDNEY SURGERY     left removed  . PACEMAKER IMPLANT     Patient Active Problem List   Diagnosis Date Noted  . History of colon cancer   . COPD with acute exacerbation ([place]) 01/08/2018  . Cellulitis 11/18/2017  . Hematoma   . HCAP (healthcare-associated pneumonia) 10/21/2016      Prior to Admission  medications   Medication Sig Start Date End Date Taking? Authorizing Provider  acetaminophen (TYLENOL) 325 MG tablet Take 650 mg by mouth every 6 (six) hours as needed.    Yes [provider]  albuterol (PROVENTIL HFA;VENTOLIN HFA) 108 (90 Base) MCG/ACT inhaler Inhale 1-2 puffs into the lungs every 6 (six) hours as needed for wheezing or shortness of breath.   Yes [provider]  albuterol (PROVENTIL) (2.5 MG/3ML) 0.083% nebulizer solution Take 2.5 mg by nebulization every 6 (six) hours as needed for wheezing or shortness of breath.   Yes [provider]  allopurinol (ZYLOPRIM) 100 MG tablet Take 1 tablet (100 mg total) by mouth daily. 01/11/18 01/11/19 Yes Dustin Flock, MD  amiodarone (PACERONE) 200 MG tablet Take 200 mg by mouth daily.   Yes [provider]  amLODipine (NORVASC) 10 MG tablet Take 10 mg by mouth daily.   Yes [provider]  apixaban (ELIQUIS) 5 MG TABS tablet Take 5 mg by mouth 2 (two) times daily.   Yes [provider]  aspirin 81 MG chewable tablet Chew 81 mg by mouth daily.   Yes [provider]  brimonidine (ALPHAGAN) 0.15 % ophthalmic solution Place 1 drop into both eyes 3 (three) times daily. 12/20/17  Yes [provider]  budesonide-formoterol (SYMBICORT) 160-4.5 MCG/ACT inhaler Inhale 2 puffs into the lungs 2 (two) times daily.   Yes [provider]  cholecalciferol (VITAMIN D) 1000 units tablet  Take 1,000 Units by mouth daily.   Yes [provider]  colchicine 0.6 MG tablet Take 1 tablet (0.6 mg total) by mouth 2 (two) times daily. 01/11/18 01/11/19 Yes Dustin Flock, MD  furosemide (LASIX) 40 MG tablet Take 40 mg by mouth 2 (two) times daily.    Yes [provider]  guaiFENesin (ROBITUSSIN) 100 MG/5ML SOLN Take 5 mLs (100 mg total) by mouth every 4 (four) hours as needed for cough or to loosen phlegm. 10/26/16  Yes Sudini, Alveta Heimlich, MD  latanoprost (XALATAN) 0.005 % ophthalmic  solution Place 1 drop into both eyes at bedtime. 12/20/17  Yes [provider]  magnesium oxide (MAG-OX) 400 MG tablet Take 400 mg by mouth 2 (two) times daily.   Yes [provider]  metoprolol (TOPROL-XL) 200 MG 24 hr tablet Take 200 mg by mouth daily.   Yes [provider]  spironolactone (ALDACTONE) 25 MG tablet Take 12.5 mg by mouth daily.   Yes [provider]  nitroGLYCERIN (NITROSTAT) 0.4 MG SL tablet Place 0.4 mg under the tongue every 5 (five) minutes as needed for chest pain.    [provider]    Allergies Patient has no known allergies.    Social History Social History   Tobacco Use  . Smoking status: Former Research scientist (life sciences)  . Smokeless tobacco: Never Used  Substance Use Topics  . Alcohol use: No  . Drug use: No    Review of Systems Patient denies headaches, rhinorrhea, blurry vision, numbness, shortness of breath, chest pain, edema, cough, abdominal pain, nausea, vomiting, diarrhea, dysuria, fevers, rashes or hallucinations unless otherwise stated above in HPI. ____________________________________________   PHYSICAL EXAM:  VITAL SIGNS: Vitals:   08/22/18 1300 08/22/18 1330  BP: (!) 171/82 (!) 157/89  Pulse: 62 63  Resp: (!) 21 16  Temp:    SpO2: 94% 93%    Constitutional: Alert and oriented.  Eyes: Conjunctivae are normal.  Head: Atraumatic. Nose: No congestion/rhinnorhea. Mouth/Throat: Mucous membranes are moist.   Neck: No stridor. Painless ROM.  Cardiovascular: Normal rate, regular rhythm. Grossly normal heart sounds.  Good peripheral circulation. Respiratory: Mild tachypnea with prolonged expiratory phase.  Faint bibasilar wheezing otherwise diminished breath sounds. Gastrointestinal: Soft and nontender. No distention. No abdominal bruits. No CVA tenderness. Genitourinary:  Musculoskeletal: No lower extremity tenderness nor edema.  No joint effusions. Neurologic:  Normal speech and language. No gross focal  neurologic deficits are appreciated. No facial droop Skin:  Skin is warm, dry and intact. No rash noted. Psychiatric: Mood and affect are normal. Speech and behavior are normal.  ____________________________________________   LABS (all labs ordered are listed, but only abnormal results are displayed)  Results for orders placed or performed during the hospital encounter of 08/22/18 (from the past 24 hour(s))  Basic metabolic panel     Status: Abnormal   Collection Time: 08/22/18  9:42 AM  Result Value Ref Range   Sodium 141 135 - 145 mmol/L   Potassium 3.9 3.5 - 5.1 mmol/L   Chloride 107 98 - 111 mmol/L   CO2 26 22 - 32 mmol/L   Glucose, Bld 124 (H) 70 - 99 mg/dL   BUN 35 (H) 8 - 23 mg/dL   Creatinine, Ser 1.48 (H) 0.61 - 1.24 mg/dL   Calcium 9.3 8.9 - 10.3 mg/dL   GFR calc non Af Amer 46 (L) >60 mL/min   GFR calc Af Amer 53 (L) >60 mL/min   Anion gap 8 5 - 15  CBC  Status: Abnormal   Collection Time: 08/22/18  9:42 AM  Result Value Ref Range   WBC 10.8 (H) 4.0 - 10.5 K/uL   RBC 4.24 4.22 - 5.81 MIL/uL   Hemoglobin 13.0 13.0 - 17.0 g/dL   HCT 40.4 39.0 - 52.0 %   MCV 95.3 80.0 - 100.0 fL   MCH 30.7 26.0 - 34.0 pg   MCHC 32.2 30.0 - 36.0 g/dL   RDW 14.7 11.5 - 15.5 %   Platelets 281 150 - 400 K/uL   nRBC 0.0 0.0 - 0.2 %  Troponin I - ONCE - STAT     Status: Abnormal   Collection Time: 08/22/18  9:42 AM  Result Value Ref Range   Troponin I 0.15 (HH) <0.03 ng/mL  Protime-INR (order if Patient is taking Coumadin / Warfarin)     Status: Abnormal   Collection Time: 08/22/18  9:42 AM  Result Value Ref Range   Prothrombin Time 17.0 (H) 11.4 - 15.2 seconds   INR 1.40   Brain natriuretic peptide     Status: Abnormal   Collection Time: 08/22/18 11:25 AM  Result Value Ref Range   B Natriuretic Peptide 392.0 (H) 0.0 - 100.0 pg/mL  Troponin I - Once-Timed     Status: Abnormal   Collection Time: 08/22/18 12:38 PM  Result Value Ref Range   Troponin I 0.14 (HH) <0.03 ng/mL    ____________________________________________  EKG My review and personal interpretation at Time: 9:40   Indication: sob  Rate: 60  Rhythm: a-v paced Axis: normal Other: abn ekg. ____________________________________________  RADIOLOGY  I personally reviewed all radiographic images ordered to evaluate for the above acute complaints and reviewed radiology reports and findings.  These findings were personally discussed with the patient.  Please see medical record for radiology report.  ____________________________________________   PROCEDURES  Procedure(s) performed:  .Critical Care Performed by: Merlyn Lot, MD Authorized by: Merlyn Lot, MD   Critical care provider statement:    Critical care time (minutes):  35   Critical care time was exclusive of:  Separately billable procedures and treating other patients   Critical care was necessary to treat or prevent imminent or life-threatening deterioration of the following conditions:  Respiratory failure   Critical care was time spent personally by me on the following activities:  Development of treatment plan with patient or surrogate, discussions with consultants, evaluation of patient's response to treatment, examination of patient, obtaining history from patient or surrogate, ordering and performing treatments and interventions, ordering and review of laboratory studies, ordering and review of radiographic studies, pulse oximetry, re-evaluation of patient's condition and review of old charts      Critical Care performed: yes  ____________________________________________   INITIAL IMPRESSION / [place] / ED COURSE  Pertinent labs & imaging results that were available during my care of the patient were reviewed by me and considered in my medical decision making (see chart for details).   DDX: Asthma, copd, CHF, pna, ptx, malignancy, Pe, anemia   Darius Taylor is a 72 y.o. who presents to the ED with  chest discomfort and shortness of breath over the past several days as described above.  Patient afebrile.  Does have some shortness of breath diminished breath sounds throughout.  Difficult to differentiate CHF versus COPD on exam.  EKG shows paced rhythm.  Certainly could be multifactorial.  Blood will be sent for the above differential.  Will give nebulizer to assess for response.  The patient will be  placed on continuous pulse oximetry and telemetry for monitoring.  Laboratory evaluation will be sent to evaluate for the above complaints.     Clinical Course as of Aug 22 1344  Fri Aug 22, 2018  1139 Renal function and troponin are roughly at the patient's baseline.  Does not have any significant respiratory distress but does have some scattered wheezing.  Will give DuoNeb and reassess as he may be having COPD exacerbation.  Will give steroid.  Will reassess and repeat troponin.  Anion gap: 8 [PR]  1230 BNP is slightly lower than previously.  Does seem to have some improvement after nebulizer treatment.  We will give him prednisone and reassess.   [PR]  1308 Patient reassessed.  Found to be more significant hypoxic.  After nebulizer treatment here more wheezing suggesting that this is some underlying COPD exacerbation.  Given his acute respiratory failure with hypoxic episodes I do believe patient will require hospitalization for further respiratory therapy.   [PR]    Clinical Course User Index [PR] Merlyn Lot, MD     As part of my medical decision making, I reviewed the following data within the [place] notes reviewed and incorporated, Labs reviewed, notes from prior ED visits.   ____________________________________________   FINAL CLINICAL IMPRESSION(S) / ED DIAGNOSES  Final diagnoses:  Acute respiratory failure with hypoxia (HCC)  COPD exacerbation (HCC)      NEW MEDICATIONS STARTED DURING THIS VISIT:  New Prescriptions   No medications on  file     Note:  This document was prepared using Dragon voice recognition software and may include unintentional dictation errors.    Merlyn Lot, MD 08/22/18 1345

## 2018-08-22 NOTE — H&P (Signed)
[place] [facility] [place] NAME: Darius Taylor    MR#:  [id number]  DATE OF BIRTH:  10/13/1945  DATE OF ADMISSION:  08/22/2018  PRIMARY CARE PHYSICIAN: [facility] [place]   REQUESTING/REFERRING PHYSICIAN: Dr Quentin Cornwall  CHIEF COMPLAINT:  increasing shortness of breath  HISTORY OF PRESENT ILLNESS:  Darius Taylor  is a 72 y.o. male with a known history of COPD, aortic stenosis, CAD comes to the emergency room which shortness of breath and intermittent chest tightness along with dry cough for several days denies any lower extremity swelling denies any fever. It was found to have wheezing. He does not wear home oxygen he does have oxygen at home. He was found to have acute on chronic COPD exacerbation. Received IV Solu-Medrol and nebulizer. Patient currently is doing well and seems to be improving with no wheezing.  X-ray negative for pneumonia.  PAST MEDICAL HISTORY:   Past Medical History:  Diagnosis Date  . Aortic stenosis   . Asthma   . Atrial fibrillation ([place])   . CAD (coronary artery disease)   . Cancer [facility])    colon, bladder and kidney  . COPD (chronic obstructive pulmonary disease) ([place])   . Hypertension     PAST SURGICAL HISTOIRY:   Past Surgical History:  Procedure Laterality Date  . CARDIAC DEFIBRILLATOR PLACEMENT    . COLONOSCOPY WITH PROPOFOL N/A 06/10/2018   Procedure: COLONOSCOPY WITH PROPOFOL;  Surgeon: Lin Landsman, MD;  Location: [facility] ENDOSCOPY;  Service: Gastroenterology;  Laterality: N/A;  . CORONARY ANGIOPLASTY  09/25/2016  . IRRIGATION AND DEBRIDEMENT HEMATOMA Left 10/24/2016   Procedure: IRRIGATION AND DEBRIDEMENT HEMATOMA;  Surgeon: Florene Glen, MD;  Location: [facility] ORS;  Service: General;  Laterality: Left;  . KIDNEY SURGERY     left removed  . PACEMAKER IMPLANT      SOCIAL HISTORY:   Social History   Tobacco Use  . Smoking status: Former Research scientist (life sciences)  . Smokeless tobacco: Never Used   Substance Use Topics  . Alcohol use: No    FAMILY HISTORY:   Family History  Problem Relation Age of Onset  . Hypertension Mother     DRUG ALLERGIES:  No Known Allergies  REVIEW OF SYSTEMS:  Review of Systems  Constitutional: Negative for chills, fever and weight loss.  HENT: Negative for ear discharge, ear pain and nosebleeds.   Eyes: Negative for blurred vision, pain and discharge.  Respiratory: Positive for shortness of breath. Negative for sputum production, wheezing and stridor.   Cardiovascular: Negative for chest pain, palpitations, orthopnea and PND.  Gastrointestinal: Negative for abdominal pain, diarrhea, nausea and vomiting.  Genitourinary: Negative for frequency and urgency.  Musculoskeletal: Negative for back pain and joint pain.  Neurological: Negative for sensory change, speech change, focal weakness and weakness.  Psychiatric/Behavioral: Negative for depression and hallucinations. The patient is not nervous/anxious.      MEDICATIONS AT HOME:   Prior to Admission medications   Medication Sig Start Date End Date Taking? Authorizing Provider  acetaminophen (TYLENOL) 325 MG tablet Take 650 mg by mouth every 6 (six) hours as needed.    Yes [provider]  albuterol (PROVENTIL HFA;VENTOLIN HFA) 108 (90 Base) MCG/ACT inhaler Inhale 1-2 puffs into the lungs every 6 (six) hours as needed for wheezing or shortness of breath.   Yes [provider]  albuterol (PROVENTIL) (2.5 MG/3ML) 0.083% nebulizer solution Take 2.5 mg by nebulization every 6 (six) hours as needed for wheezing or  shortness of breath.   Yes [provider]  allopurinol (ZYLOPRIM) 100 MG tablet Take 1 tablet (100 mg total) by mouth daily. 01/11/18 01/11/19 Yes Dustin Flock, MD  amiodarone (PACERONE) 200 MG tablet Take 200 mg by mouth daily.   Yes [provider]  amLODipine (NORVASC) 10 MG tablet Take 10 mg by mouth daily.   Yes [provider]  apixaban  (ELIQUIS) 5 MG TABS tablet Take 5 mg by mouth 2 (two) times daily.   Yes [provider]  aspirin 81 MG chewable tablet Chew 81 mg by mouth daily.   Yes [provider]  brimonidine (ALPHAGAN) 0.15 % ophthalmic solution Place 1 drop into both eyes 3 (three) times daily. 12/20/17  Yes [provider]  budesonide-formoterol (SYMBICORT) 160-4.5 MCG/ACT inhaler Inhale 2 puffs into the lungs 2 (two) times daily.   Yes [provider]  cholecalciferol (VITAMIN D) 1000 units tablet Take 1,000 Units by mouth daily.   Yes [provider]  colchicine 0.6 MG tablet Take 1 tablet (0.6 mg total) by mouth 2 (two) times daily. 01/11/18 01/11/19 Yes Dustin Flock, MD  furosemide (LASIX) 40 MG tablet Take 40 mg by mouth 2 (two) times daily.    Yes [provider]  guaiFENesin (ROBITUSSIN) 100 MG/5ML SOLN Take 5 mLs (100 mg total) by mouth every 4 (four) hours as needed for cough or to loosen phlegm. 10/26/16  Yes Sudini, Alveta Heimlich, MD  latanoprost (XALATAN) 0.005 % ophthalmic solution Place 1 drop into both eyes at bedtime. 12/20/17  Yes [provider]  magnesium oxide (MAG-OX) 400 MG tablet Take 400 mg by mouth 2 (two) times daily.   Yes [provider]  metoprolol (TOPROL-XL) 200 MG 24 hr tablet Take 200 mg by mouth daily.   Yes [provider]  spironolactone (ALDACTONE) 25 MG tablet Take 12.5 mg by mouth daily.   Yes [provider]  nitroGLYCERIN (NITROSTAT) 0.4 MG SL tablet Place 0.4 mg under the tongue every 5 (five) minutes as needed for chest pain.    [provider]      VITAL SIGNS:  Blood pressure (!) 171/102, pulse 64, temperature 97.8 F (36.6 C), temperature source Oral, resp. rate (!) 21, height 5\' 10"  (1.778 m), weight 98.4 kg, SpO2 94 %.  PHYSICAL EXAMINATION:  GENERAL:  72 y.o.-year-old patient lying in the bed with no acute distress.  EYES: Pupils equal, round, reactive to light and accommodation. No  scleral icterus. Extraocular muscles intact.  HEENT: Head atraumatic, normocephalic. Oropharynx and nasopharynx clear.  NECK:  Supple, no jugular venous distention. No thyroid enlargement, no tenderness.  LUNGS: Normal breath sounds bilaterally, no wheezing, rales,rhonchi or crepitation. No use of accessory muscles of respiration. No respiratory distress CARDIOVASCULAR: S1, S2 normal. No murmurs, rubs, or gallops.  ABDOMEN: Soft, nontender, nondistended. Bowel sounds present. No organomegaly or mass.  EXTREMITIES: No pedal edema, cyanosis, or clubbing.  NEUROLOGIC: Cranial nerves II through XII are intact. Muscle strength 5/5 in all extremities. Sensation intact. Gait not checked.  PSYCHIATRIC: The patient is alert and oriented x 3.  SKIN: No obvious rash, lesion, or ulcer.   LABORATORY PANEL:   CBC Recent Labs  Lab 08/22/18 0942  WBC 10.8*  HGB 13.0  HCT 40.4  PLT 281   ------------------------------------------------------------------------------------------------------------------  Chemistries  Recent Labs  Lab 08/22/18 0942  NA 141  K 3.9  CL 107  CO2 26  GLUCOSE 124*  BUN 35*  CREATININE 1.48*  CALCIUM 9.3   ------------------------------------------------------------------------------------------------------------------  Cardiac Enzymes Recent Labs  Lab 08/22/18 1238  TROPONINI 0.14*   ------------------------------------------------------------------------------------------------------------------  RADIOLOGY:  Dg Chest 2 View  Result Date: 08/22/2018 CLINICAL DATA:  Shortness of breath. EXAM: CHEST - 2 VIEW COMPARISON:  Radiographs of January 08, 2018. FINDINGS: Stable cardiomegaly. Atherosclerosis of thoracic aorta is noted. Left-sided pacemaker is unchanged in position. No pneumothorax or pleural effusion is noted. Stable elevated left hemidiaphragm is noted. Status post aortic valve repair. No acute pulmonary disease is noted. Bony thorax is unremarkable.  IMPRESSION: No active cardiopulmonary disease. Aortic Atherosclerosis (ICD10-I70.0). Electronically Signed   By: Marijo Conception, M.D.   On: 08/22/2018 10:09    EKG:    IMPRESSION AND PLAN:   Darius Taylor  is a 72 y.o. male with a known history of COPD, aortic stenosis, CAD comes to the emergency room which shortness of breath and intermittent chest tightness along with dry cough for several days denies any lower extremity swelling denies any fever. It was found to have wheezing. He does not wear home oxygen he does have oxygen at home. He was found to have acute on chronic COPD exacerbation.  1. acute on chronic COPD exacerbation -admit to medical floor -IV Solu-Medrol, nebulizer, inhalers, incentive spirometer, wean oxygen as tolerated -no indication for antibiotics.  2. hypertension continue home meds  3. Chronic atrial fibrillation on anticoagulation  4. DVT prophylaxis continue eliquis  All the records are reviewed and case discussed with ED provider. Management plans discussed with the patient, family and they are in agreement.  CODE STATUS: full  TOTAL TIME TAKING CARE OF THIS PATIENT: 50** minutes.    Fritzi Mandes M.D on 08/22/2018 at 2:55 PM  Between 7am to 6pm - Pager - [phone]  After 6pm go to [email or web address] - password [email or web address]  [facility]  Office  [phone]  CC: Primary care physician; [employer] [employer]

## 2018-08-23 NOTE — Progress Notes (Signed)
[facility] [place] NAME: Darius Taylor    MR#:  [id number]  DATE OF BIRTH:  [birth year]  SUBJECTIVE:  CHIEF COMPLAINT:   Chief Complaint  Patient presents with  . Shortness of Breath   Continues to have shortness of breath and dry cough.  Wheezing present.  Afebrile.  On 2 L oxygen.  REVIEW OF SYSTEMS:    Review of Systems  Constitutional: Positive for malaise/fatigue. Negative for chills and fever.  HENT: Negative for sore throat.   Eyes: Negative for blurred vision, double vision and pain.  Respiratory: Positive for cough, shortness of breath and wheezing. Negative for hemoptysis.   Cardiovascular: Negative for chest pain, palpitations, orthopnea and leg swelling.  Gastrointestinal: Negative for abdominal pain, constipation, diarrhea, heartburn, nausea and vomiting.  Genitourinary: Negative for dysuria and hematuria.  Musculoskeletal: Negative for back pain and joint pain.  Skin: Negative for rash.  Neurological: Negative for sensory change, speech change, focal weakness and headaches.  Endo/Heme/Allergies: Does not bruise/bleed easily.  Psychiatric/Behavioral: Negative for depression. The patient is not nervous/anxious.     DRUG ALLERGIES:  No Known Allergies  VITALS:  Blood pressure (!) 141/58, pulse 63, temperature 98.1 F (36.7 C), temperature source Oral, resp. rate 20, height 5\' 10"  (1.778 m), weight 98.4 kg, SpO2 94 %.  PHYSICAL EXAMINATION:   Physical Exam  GENERAL:  72 y.o.-year-old patient lying in the bed with conversational dyspnea EYES: Pupils equal, round, reactive to light and accommodation. No scleral icterus. Extraocular muscles intact.  HEENT: Head atraumatic, normocephalic. Oropharynx and nasopharynx clear.  NECK:  Supple, no jugular venous distention. No thyroid enlargement, no tenderness.  LUNGS: Creased air entry bilaterally with wheezing CARDIOVASCULAR: S1, S2 normal. No murmurs, rubs, or gallops.   ABDOMEN: Soft, nontender, nondistended. Bowel sounds present. No organomegaly or mass.  EXTREMITIES: No cyanosis, clubbing or edema b/l.    NEUROLOGIC: Cranial nerves II through XII are intact. No focal Motor or sensory deficits b/l.   PSYCHIATRIC: The patient is alert and oriented x 3.  SKIN: No obvious rash, lesion, or ulcer.   LABORATORY PANEL:   CBC Recent Labs  Lab 08/22/18 0942  WBC 10.8*  HGB 13.0  HCT 40.4  PLT 281   ------------------------------------------------------------------------------------------------------------------ Chemistries  Recent Labs  Lab 08/22/18 0942  NA 141  K 3.9  CL 107  CO2 26  GLUCOSE 124*  BUN 35*  CREATININE 1.48*  CALCIUM 9.3   ------------------------------------------------------------------------------------------------------------------  Cardiac Enzymes Recent Labs  Lab 08/22/18 1238  TROPONINI 0.14*   ------------------------------------------------------------------------------------------------------------------  RADIOLOGY:  Dg Chest 2 View  Result Date: 08/22/2018 CLINICAL DATA:  Shortness of breath. EXAM: CHEST - 2 VIEW COMPARISON:  Radiographs of January 08, 2018. FINDINGS: Stable cardiomegaly. Atherosclerosis of thoracic aorta is noted. Left-sided pacemaker is unchanged in position. No pneumothorax or pleural effusion is noted. Stable elevated left hemidiaphragm is noted. Status post aortic valve repair. No acute pulmonary disease is noted. Bony thorax is unremarkable. IMPRESSION: No active cardiopulmonary disease. Aortic Atherosclerosis (ICD10-I70.0). Electronically Signed   By: Marijo Conception, M.D.   On: 08/22/2018 10:09     ASSESSMENT AND PLAN:   *Acute COPD exacerbation with acute hypoxic respiratory failure -IV steroids - Scheduled Nebulizers - Inhalers -Wean O2 as tolerated - Consult pulmonary if no improvement  *CAD with chronic elevation of troponin.  Likely demand ischemia.  Stable on repeat troponin  check.  *Hypertension.  Continue home medications.  *Paroxysmal atrial fibrillation.  On anticoagulation.  *  DVT prophylaxis.  Patient is on Eliquis.  All the records are reviewed and case discussed with Care Management/Social Worker Management plans discussed with the patient, family and they are in agreement.  CODE STATUS: FULL CODE  DVT Prophylaxis: SCDs  TOTAL TIME TAKING CARE OF THIS PATIENT: 35 minutes.   POSSIBLE D/C IN 1-2 DAYS, DEPENDING ON CLINICAL CONDITION.  [name] [name] M.D on 08/23/2018 at 11:53 AM  Between 7am to 6pm - Pager - [phone]  After 6pm go to [email or web address] - password [email or web address] [facility]  Office  [phone]  CC: Primary care physician; [employer]  Note: This dictation was prepared with Diplomatic Services operational officer dictation along with smaller Company secretary. Any transcriptional errors that result from this process are unintentional.

## 2018-08-23 NOTE — Progress Notes (Addendum)
Advance care planning  Purpose of Encounter COPD  Parties in Attendance Patient  Patients Decisional capacity Alert and oriented  No HCPOA or advance directives documents  Discussed regarding COPD, course, prognosis and treatment.  Code status - full code  Time spent - 17 minutes

## 2018-08-24 NOTE — Progress Notes (Signed)
[place] at [place] NAME: Donterius Filley    MR#:  [id number]  DATE OF BIRTH:  September 15, 1946  SUBJECTIVE:  CHIEF COMPLAINT:   Chief Complaint  Patient presents with  . Shortness of Breath   Continues to have shortness of breath and dry cough.   Needing 2 L o2. 84% on RA  REVIEW OF SYSTEMS:    Review of Systems  Constitutional: Positive for malaise/fatigue. Negative for chills and fever.  HENT: Negative for sore throat.   Eyes: Negative for blurred vision, double vision and pain.  Respiratory: Positive for cough, shortness of breath and wheezing. Negative for hemoptysis.   Cardiovascular: Negative for chest pain, palpitations, orthopnea and leg swelling.  Gastrointestinal: Negative for abdominal pain, constipation, diarrhea, heartburn, nausea and vomiting.  Genitourinary: Negative for dysuria and hematuria.  Musculoskeletal: Negative for back pain and joint pain.  Skin: Negative for rash.  Neurological: Negative for sensory change, speech change, focal weakness and headaches.  Endo/Heme/Allergies: Does not bruise/bleed easily.  Psychiatric/Behavioral: Negative for depression. The patient is not nervous/anxious.     DRUG ALLERGIES:  No Known Allergies  VITALS:  Blood pressure (!) 149/78, pulse (!) 58, temperature (!) 97.5 F (36.4 C), temperature source Oral, resp. rate 20, height 5\' 10"  (1.778 m), weight 98.4 kg, SpO2 (!) 88 %.  PHYSICAL EXAMINATION:   Physical Exam  GENERAL:  72 y.o.-year-old patient lying in the bed with conversational dyspnea EYES: Pupils equal, round, reactive to light and accommodation. No scleral icterus. Extraocular muscles intact.  HEENT: Head atraumatic, normocephalic. Oropharynx and nasopharynx clear.  NECK:  Supple, no jugular venous distention. No thyroid enlargement, no tenderness.  LUNGS: Decreased air entry bilaterally with wheezing CARDIOVASCULAR: S1, S2 normal. No murmurs, rubs, or gallops.   ABDOMEN: Soft, nontender, nondistended. Bowel sounds present. No organomegaly or mass.  EXTREMITIES: No cyanosis, clubbing or edema b/l.    NEUROLOGIC: Cranial nerves II through XII are intact. No focal Motor or sensory deficits b/l.   PSYCHIATRIC: The patient is alert and oriented x 3.  SKIN: No obvious rash, lesion, or ulcer.   LABORATORY PANEL:   CBC Recent Labs  Lab 08/22/18 0942  WBC 10.8*  HGB 13.0  HCT 40.4  PLT 281   ------------------------------------------------------------------------------------------------------------------ Chemistries  Recent Labs  Lab 08/22/18 0942  NA 141  K 3.9  CL 107  CO2 26  GLUCOSE 124*  BUN 35*  CREATININE 1.48*  CALCIUM 9.3   ------------------------------------------------------------------------------------------------------------------  Cardiac Enzymes Recent Labs  Lab 08/22/18 1238  TROPONINI 0.14*   ------------------------------------------------------------------------------------------------------------------  RADIOLOGY:  No results found.   ASSESSMENT AND PLAN:   *Acute COPD exacerbation with acute hypoxic respiratory failure -IV steroids - Scheduled Nebulizers - Inhalers -Wean O2 as tolerated - Consult pulmonary if no improvement  Continues to have 84% on room air.  Will need further inpatient care.  *CAD with chronic elevation of troponin.  Likely demand ischemia.  Stable on repeat troponin check.  *Hypertension.  Continue home medications.  *Paroxysmal atrial fibrillation.  On anticoagulation.  *DVT prophylaxis.  Patient is on Eliquis.  All the records are reviewed and case discussed with Care Management/Social Worker Management plans discussed with the patient, family and they are in agreement.  CODE STATUS: FULL CODE  DVT Prophylaxis: SCDs  TOTAL TIME TAKING CARE OF THIS PATIENT: 35 minutes.   POSSIBLE D/C IN 1-2 DAYS, DEPENDING ON CLINICAL CONDITION.  Neita Carp M.D on 08/24/2018  at 12:16 PM  Between 7am  to 6pm - Pager - [phone]  After 6pm go to [email or web address] - password [email or web address] [facility]  Office  [phone]  CC: Primary care physician; [employer]  Note: This dictation was prepared with Diplomatic Services operational officer dictation along with smaller Company secretary. Any transcriptional errors that result from this process are unintentional.

## 2018-08-25 MED ORDER — PREDNISONE 10 MG (21) PO TBPK: ORAL_TABLET | ORAL | 0 refills | Status: DC

## 2018-08-25 MED ORDER — TIOTROPIUM BROMIDE MONOHYDRATE 18 MCG IN CAPS: 18.0000 ug | ORAL_CAPSULE | Freq: Every day | RESPIRATORY_TRACT | 0 refills | Status: DC

## 2018-08-25 NOTE — Care Management (Addendum)
Patient presents from home.  Verbally confirmed he does have oxygen in the home but does not use because the portable tanks are too large.  Does not know the name of the agency.  has also had home health nurse and does not know name of agency.  CM found that oxygen is provided by [facility] and agency also provided home health nurse. Agreeable to home health nurse and agency preference is [facility]. Obtained order for in home portable oxygen concentrator assessment and home health nurse.  [facility] aware.  Family will transport patient home.  Patient is followed by care providers at the [facility].

## 2018-08-25 NOTE — Progress Notes (Signed)
SATURATION QUALIFICATIONS: (This note is used to comply with regulatory documentation for home oxygen)  Patient Saturations on Room Air at Rest =94%  Patient Saturations on Room Air while Ambulating = 87%  Patient Saturations on 2 Liters of oxygen while Ambulating = 93%

## 2018-08-25 NOTE — Care Management Important Message (Signed)
Copy of signed IM left with patient in room.  

## 2018-08-25 NOTE — Progress Notes (Signed)
[facility]  PCP: Eden Emms or Olga? At [facility] [phone]  If patient discharges after hours, please call [phone].   Darius Taylor 08/25/2018, 1:09 PM

## 2018-08-30 ENCOUNTER — Other Ambulatory Visit: Payer: Self-pay

## 2018-08-30 ENCOUNTER — Inpatient Hospital Stay
Admission: EM | Admit: 2018-08-30 | Discharge: 2018-09-02 | DRG: 193 | Disposition: A | Discharge status: Home Health | Payer: Medicare HMO | Attending: Internal Medicine | Admitting: Internal Medicine

## 2018-08-30 ENCOUNTER — Encounter: Payer: Self-pay | Admitting: Emergency Medicine

## 2018-08-30 ENCOUNTER — Emergency Department: Payer: Medicare HMO

## 2018-08-30 DIAGNOSIS — R0902 Hypoxemia: Secondary | ICD-10-CM

## 2018-08-30 DIAGNOSIS — Z85038 Personal history of other malignant neoplasm of large intestine: Secondary | ICD-10-CM | POA: Diagnosis not present

## 2018-08-30 DIAGNOSIS — Z7982 Long term (current) use of aspirin: Secondary | ICD-10-CM | POA: Diagnosis not present

## 2018-08-30 DIAGNOSIS — Z7901 Long term (current) use of anticoagulants: Secondary | ICD-10-CM | POA: Diagnosis not present

## 2018-08-30 DIAGNOSIS — Z8249 Family history of ischemic heart disease and other diseases of the circulatory system: Secondary | ICD-10-CM | POA: Diagnosis not present

## 2018-08-30 DIAGNOSIS — Z905 Acquired absence of kidney: Secondary | ICD-10-CM | POA: Diagnosis not present

## 2018-08-30 DIAGNOSIS — Z87891 Personal history of nicotine dependence: Secondary | ICD-10-CM

## 2018-08-30 DIAGNOSIS — Z9981 Dependence on supplemental oxygen: Secondary | ICD-10-CM

## 2018-08-30 DIAGNOSIS — J9621 Acute and chronic respiratory failure with hypoxia: Secondary | ICD-10-CM | POA: Diagnosis present

## 2018-08-30 DIAGNOSIS — R7989 Other specified abnormal findings of blood chemistry: Secondary | ICD-10-CM | POA: Diagnosis present

## 2018-08-30 DIAGNOSIS — I2510 Atherosclerotic heart disease of native coronary artery without angina pectoris: Secondary | ICD-10-CM | POA: Diagnosis present

## 2018-08-30 DIAGNOSIS — J440 Chronic obstructive pulmonary disease with acute lower respiratory infection: Secondary | ICD-10-CM | POA: Diagnosis present

## 2018-08-30 DIAGNOSIS — Z95810 Presence of automatic (implantable) cardiac defibrillator: Secondary | ICD-10-CM | POA: Diagnosis not present

## 2018-08-30 DIAGNOSIS — I350 Nonrheumatic aortic (valve) stenosis: Secondary | ICD-10-CM | POA: Diagnosis present

## 2018-08-30 DIAGNOSIS — Z950 Presence of cardiac pacemaker: Secondary | ICD-10-CM

## 2018-08-30 DIAGNOSIS — R042 Hemoptysis: Secondary | ICD-10-CM | POA: Diagnosis not present

## 2018-08-30 DIAGNOSIS — Y95 Nosocomial condition: Secondary | ICD-10-CM | POA: Diagnosis present

## 2018-08-30 DIAGNOSIS — I5033 Acute on chronic diastolic (congestive) heart failure: Secondary | ICD-10-CM | POA: Diagnosis present

## 2018-08-30 DIAGNOSIS — Z7951 Long term (current) use of inhaled steroids: Secondary | ICD-10-CM

## 2018-08-30 DIAGNOSIS — Z79899 Other long term (current) drug therapy: Secondary | ICD-10-CM

## 2018-08-30 DIAGNOSIS — J189 Pneumonia, unspecified organism: Secondary | ICD-10-CM | POA: Diagnosis not present

## 2018-08-30 DIAGNOSIS — J441 Chronic obstructive pulmonary disease with (acute) exacerbation: Secondary | ICD-10-CM | POA: Diagnosis present

## 2018-08-30 DIAGNOSIS — I110 Hypertensive heart disease with heart failure: Secondary | ICD-10-CM | POA: Diagnosis present

## 2018-08-30 DIAGNOSIS — J9611 Chronic respiratory failure with hypoxia: Secondary | ICD-10-CM | POA: Diagnosis present

## 2018-08-30 DIAGNOSIS — I482 Chronic atrial fibrillation, unspecified: Secondary | ICD-10-CM | POA: Diagnosis present

## 2018-08-30 DIAGNOSIS — R778 Other specified abnormalities of plasma proteins: Secondary | ICD-10-CM

## 2018-08-30 LAB — COMPREHENSIVE METABOLIC PANEL
ALT: 46 U/L — ABNORMAL HIGH (ref 0–44)
AST: 33 U/L (ref 15–41)
Albumin: 3.3 g/dL — ABNORMAL LOW (ref 3.5–5.0)
Alkaline Phosphatase: 77 U/L (ref 38–126)
Anion gap: 10 (ref 5–15)
BILIRUBIN TOTAL: 1.5 mg/dL — AB (ref 0.3–1.2)
BUN: 42 mg/dL — ABNORMAL HIGH (ref 8–23)
CO2: 28 mmol/L (ref 22–32)
Calcium: 8.4 mg/dL — ABNORMAL LOW (ref 8.9–10.3)
Chloride: 98 mmol/L (ref 98–111)
Creatinine, Ser: 1.72 mg/dL — ABNORMAL HIGH (ref 0.61–1.24)
GFR calc Af Amer: 44 mL/min — ABNORMAL LOW (ref >60)
GFR calc non Af Amer: 38 mL/min — ABNORMAL LOW (ref >60)
Glucose, Bld: 132 mg/dL — ABNORMAL HIGH (ref 70–99)
Potassium: 4.3 mmol/L (ref 3.5–5.1)
Sodium: 136 mmol/L (ref 135–145)
TOTAL PROTEIN: 7.5 g/dL (ref 6.5–8.1)

## 2018-08-30 LAB — BLOOD GAS, VENOUS
Acid-Base Excess: 5.6 mmol/L — ABNORMAL HIGH (ref 0.0–2.0)
Bicarbonate: 29.8 mmol/L — ABNORMAL HIGH (ref 20.0–28.0)
O2 SAT: 91.9 %
Patient temperature: 37
pCO2, Ven: 41 mmHg — ABNORMAL LOW (ref 44.0–60.0)
pH, Ven: 7.47 — ABNORMAL HIGH (ref 7.250–7.430)
pO2, Ven: 59 mmHg — ABNORMAL HIGH (ref 32.0–45.0)

## 2018-08-30 LAB — TROPONIN I
Troponin I: 0.17 ng/mL (ref <0.03)
Troponin I: 0.2 ng/mL (ref <0.03)
Troponin I: 0.16 ng/mL (ref <0.03)

## 2018-08-30 LAB — CBC WITH DIFFERENTIAL/PLATELET
Abs Immature Granulocytes: 0.49 10*3/uL — ABNORMAL HIGH (ref 0.00–0.07)
Basophils Absolute: 0.1 10*3/uL (ref 0.0–0.1)
Basophils Relative: 0 %
EOS PCT: 0 %
Eosinophils Absolute: 0 10*3/uL (ref 0.0–0.5)
HEMATOCRIT: 37.2 % — AB (ref 39.0–52.0)
Hemoglobin: 12 g/dL — ABNORMAL LOW (ref 13.0–17.0)
Immature Granulocytes: 2 %
Lymphocytes Relative: 5 %
Lymphs Abs: 1.2 10*3/uL (ref 0.7–4.0)
MCH: 30.7 pg (ref 26.0–34.0)
MCHC: 32.3 g/dL (ref 30.0–36.0)
MCV: 95.1 fL (ref 80.0–100.0)
Monocytes Absolute: 2.6 10*3/uL — ABNORMAL HIGH (ref 0.1–1.0)
Monocytes Relative: 11 %
Neutro Abs: 20.2 10*3/uL — ABNORMAL HIGH (ref 1.7–7.7)
Neutrophils Relative %: 82 %
Platelets: 204 10*3/uL (ref 150–400)
RBC: 3.91 MIL/uL — ABNORMAL LOW (ref 4.22–5.81)
RDW: 15.2 % (ref 11.5–15.5)
WBC: 24.5 10*3/uL — ABNORMAL HIGH (ref 4.0–10.5)
nRBC: 0 % (ref 0.0–0.2)

## 2018-08-30 LAB — PROCALCITONIN: Procalcitonin: 8.34 ng/mL

## 2018-08-30 LAB — EXPECTORATED SPUTUM ASSESSMENT W REFEX TO RESP CULTURE

## 2018-08-30 LAB — EXPECTORATED SPUTUM ASSESSMENT W GRAM STAIN, RFLX TO RESP C

## 2018-08-30 LAB — CG4 I-STAT (LACTIC ACID): Lactic Acid, Venous: 1.25 mmol/L (ref 0.5–1.9)

## 2018-08-30 LAB — BRAIN NATRIURETIC PEPTIDE: B Natriuretic Peptide: 310 pg/mL — ABNORMAL HIGH (ref 0.0–100.0)

## 2018-08-30 MED ORDER — ALLOPURINOL 100 MG PO TABS
100.0000 mg | ORAL_TABLET | Freq: Every day | ORAL | Status: DC
  Administered 2018-08-30 – 2018-09-02 (×4): 100 mg via ORAL
  Filled 2018-08-30 (×4): qty 1

## 2018-08-30 MED ORDER — FUROSEMIDE 10 MG/ML IJ SOLN
20.0000 mg | Freq: Once | INTRAMUSCULAR | Status: AC
  Administered 2018-08-30: 20 mg via INTRAVENOUS
  Filled 2018-08-30: qty 2

## 2018-08-30 MED ORDER — SODIUM CHLORIDE 0.9 % IV SOLN
2.0000 g | Freq: Two times a day (BID) | INTRAVENOUS | Status: DC
  Administered 2018-08-30 – 2018-09-01 (×4): 2 g via INTRAVENOUS
  Filled 2018-08-30 (×6): qty 2

## 2018-08-30 MED ORDER — FUROSEMIDE 10 MG/ML IJ SOLN
20.0000 mg | Freq: Two times a day (BID) | INTRAMUSCULAR | Status: DC
  Administered 2018-08-30: 20 mg via INTRAVENOUS
  Filled 2018-08-30: qty 2

## 2018-08-30 MED ORDER — ALBUTEROL SULFATE (2.5 MG/3ML) 0.083% IN NEBU: 3.0000 mL | INHALATION_SOLUTION | Freq: Four times a day (QID) | RESPIRATORY_TRACT | Status: DC | PRN

## 2018-08-30 MED ORDER — METHYLPREDNISOLONE SODIUM SUCC 125 MG IJ SOLR
125.0000 mg | Freq: Once | INTRAMUSCULAR | Status: AC
  Administered 2018-08-30: 125 mg via INTRAVENOUS
  Filled 2018-08-30: qty 2

## 2018-08-30 MED ORDER — ASPIRIN 81 MG PO CHEW
81.0000 mg | CHEWABLE_TABLET | Freq: Every day | ORAL | Status: DC
  Administered 2018-08-30 – 2018-09-02 (×4): 81 mg via ORAL
  Filled 2018-08-30 (×4): qty 1

## 2018-08-30 MED ORDER — TIOTROPIUM BROMIDE MONOHYDRATE 18 MCG IN CAPS
18.0000 ug | ORAL_CAPSULE | Freq: Every day | RESPIRATORY_TRACT | Status: DC
  Administered 2018-08-30 – 2018-09-02 (×4): 18 ug via RESPIRATORY_TRACT
  Filled 2018-08-30: qty 5

## 2018-08-30 MED ORDER — POLYETHYLENE GLYCOL 3350 17 G PO PACK: 17.0000 g | PACK | Freq: Every day | ORAL | Status: DC | PRN

## 2018-08-30 MED ORDER — AMIODARONE HCL 200 MG PO TABS
200.0000 mg | ORAL_TABLET | Freq: Every day | ORAL | Status: DC
  Administered 2018-08-30 – 2018-09-02 (×4): 200 mg via ORAL
  Filled 2018-08-30 (×4): qty 1

## 2018-08-30 MED ORDER — BRIMONIDINE TARTRATE 0.15 % OP SOLN
1.0000 [drp] | Freq: Three times a day (TID) | OPHTHALMIC | Status: DC
  Administered 2018-08-30 – 2018-09-02 (×7): 1 [drp] via OPHTHALMIC
  Filled 2018-08-30 (×2): qty 5

## 2018-08-30 MED ORDER — COLCHICINE 0.6 MG PO TABS
0.6000 mg | ORAL_TABLET | Freq: Two times a day (BID) | ORAL | Status: DC
  Administered 2018-08-30 – 2018-09-02 (×7): 0.6 mg via ORAL
  Filled 2018-08-30 (×7): qty 1

## 2018-08-30 MED ORDER — DOCUSATE SODIUM 100 MG PO CAPS
100.0000 mg | ORAL_CAPSULE | Freq: Two times a day (BID) | ORAL | Status: DC
  Administered 2018-08-30 – 2018-09-02 (×4): 100 mg via ORAL
  Filled 2018-08-30 (×5): qty 1

## 2018-08-30 MED ORDER — IPRATROPIUM-ALBUTEROL 0.5-2.5 (3) MG/3ML IN SOLN
3.0000 mL | Freq: Once | RESPIRATORY_TRACT | Status: AC
  Administered 2018-08-30: 3 mL via RESPIRATORY_TRACT
  Filled 2018-08-30: qty 3

## 2018-08-30 MED ORDER — LATANOPROST 0.005 % OP SOLN
1.0000 [drp] | Freq: Every day | OPHTHALMIC | Status: DC
  Administered 2018-08-30 – 2018-09-01 (×3): 1 [drp] via OPHTHALMIC
  Filled 2018-08-30: qty 2.5

## 2018-08-30 MED ORDER — ACETAMINOPHEN 325 MG PO TABS
650.0000 mg | ORAL_TABLET | Freq: Four times a day (QID) | ORAL | Status: DC | PRN
  Administered 2018-09-02: 650 mg via ORAL
  Filled 2018-08-30: qty 2

## 2018-08-30 MED ORDER — ONDANSETRON HCL 4 MG PO TABS: 4.0000 mg | ORAL_TABLET | Freq: Four times a day (QID) | ORAL | Status: DC | PRN

## 2018-08-30 MED ORDER — ORAL CARE MOUTH RINSE
15.0000 mL | Freq: Two times a day (BID) | OROMUCOSAL | Status: DC
  Administered 2018-08-30 – 2018-08-31 (×2): 15 mL via OROMUCOSAL

## 2018-08-30 MED ORDER — MOMETASONE FURO-FORMOTEROL FUM 200-5 MCG/ACT IN AERO
2.0000 | INHALATION_SPRAY | Freq: Two times a day (BID) | RESPIRATORY_TRACT | Status: DC
  Administered 2018-08-30 – 2018-09-02 (×7): 2 via RESPIRATORY_TRACT
  Filled 2018-08-30: qty 8.8

## 2018-08-30 MED ORDER — ONDANSETRON HCL 4 MG/2ML IJ SOLN: 4.0000 mg | Freq: Four times a day (QID) | INTRAMUSCULAR | Status: DC | PRN

## 2018-08-30 MED ORDER — FUROSEMIDE 40 MG PO TABS: 40.0000 mg | ORAL_TABLET | Freq: Two times a day (BID) | ORAL | Status: DC

## 2018-08-30 MED ORDER — VITAMIN D3 25 MCG (1000 UNIT) PO TABS
1000.0000 [IU] | ORAL_TABLET | Freq: Every day | ORAL | Status: DC
  Administered 2018-08-30 – 2018-09-02 (×4): 1000 [IU] via ORAL
  Filled 2018-08-30 (×4): qty 1

## 2018-08-30 MED ORDER — SODIUM CHLORIDE 0.9 % IV SOLN
INTRAVENOUS | Status: DC | PRN
  Administered 2018-08-30 – 2018-08-31 (×2): 500 mL via INTRAVENOUS

## 2018-08-30 MED ORDER — MAGNESIUM OXIDE 400 (241.3 MG) MG PO TABS
400.0000 mg | ORAL_TABLET | Freq: Two times a day (BID) | ORAL | Status: DC
  Administered 2018-08-30 – 2018-09-02 (×7): 400 mg via ORAL
  Filled 2018-08-30 (×7): qty 1

## 2018-08-30 MED ORDER — APIXABAN 5 MG PO TABS
5.0000 mg | ORAL_TABLET | Freq: Two times a day (BID) | ORAL | Status: DC
  Administered 2018-08-30 – 2018-09-02 (×6): 5 mg via ORAL
  Filled 2018-08-30 (×7): qty 1

## 2018-08-30 MED ORDER — GUAIFENESIN 100 MG/5ML PO SOLN
5.0000 mL | ORAL | Status: DC | PRN
  Administered 2018-08-30 – 2018-09-02 (×9): 100 mg via ORAL
  Filled 2018-08-30 (×17): qty 5

## 2018-08-30 MED ORDER — SODIUM CHLORIDE 0.9 % IV SOLN
1.0000 g | Freq: Once | INTRAVENOUS | Status: AC
  Administered 2018-08-30: 1 g via INTRAVENOUS
  Filled 2018-08-30: qty 1

## 2018-08-30 NOTE — ED Notes (Signed)
Date and time results received: 08/30/18 0907 (use smartphrase ".now" to insert current time)  Test: troponin Critical Value: 0.17  Name of Provider Notified: Cinda Quest

## 2018-08-30 NOTE — Progress Notes (Signed)
Family Meeting Note  [facility] Directive yes   [facility] Directive no Today a meeting took place with the pt patient being admitted with acute on chronic CHF diastolic exacerbation along with pneumonia and leukocytosis came in here with shortness of breath and wheezing. Code status discussed patient wants to be full code. No family in the room.   Time spent during discussion 16 minutes Fritzi Mandes, MD

## 2018-08-30 NOTE — H&P (Addendum)
[facility] NAME: Trennon Torbeck    MR#:  [id number]  DATE OF BIRTH:  [birth year]  DATE OF ADMISSION:  08/30/2018  PRIMARY CARE PHYSICIAN: [facility] [place]   REQUESTING/REFERRING PHYSICIAN: Dr Cinda Quest  CHIEF COMPLAINT:  increasing shortness of breath chills and congestion. Cough with productive phlegm for two days  HISTORY OF PRESENT ILLNESS:  Rashawn Rayman  is a 72 y.o. male with a known history of congestive heart failure diastolic, chronic a fib on anticoagulation, CAD who was recently admitted and discharged for COPD exacerbation comes to the emergency room with increasing weakness possible fever with chills cough with congestion and productive phlegm. Patient was found to have elevated white count of 24,000 however he was still on a steroid taper as well. He was also noted to have new infiltrate on his chest x-ray consistent with possible pneumonia and pulmonary vascular congestion consistent with acute on chronic diastolic heart failure  he received IV antibiotics and the emergency room he is being admitted for further evaluation and management.  PAST MEDICAL HISTORY:   Past Medical History:  Diagnosis Date  . Aortic stenosis   . Asthma   . Atrial fibrillation ([place])   . CAD (coronary artery disease)   . Cancer [facility])    colon, bladder and kidney  . COPD (chronic obstructive pulmonary disease) ([place])   . Hypertension     PAST SURGICAL HISTOIRY:   Past Surgical History:  Procedure Laterality Date  . CARDIAC DEFIBRILLATOR PLACEMENT    . COLONOSCOPY WITH PROPOFOL N/A 06/10/2018   Procedure: COLONOSCOPY WITH PROPOFOL;  Surgeon: Lin Landsman, MD;  Location: [facility] ENDOSCOPY;  Service: Gastroenterology;  Laterality: N/A;  . CORONARY ANGIOPLASTY  09/25/2016  . IRRIGATION AND DEBRIDEMENT HEMATOMA Left 10/24/2016   Procedure: IRRIGATION AND DEBRIDEMENT HEMATOMA;  Surgeon: Florene Glen, MD;  Location: [facility]  ORS;  Service: General;  Laterality: Left;  . KIDNEY SURGERY     left removed  . PACEMAKER IMPLANT      SOCIAL HISTORY:   Social History   Tobacco Use  . Smoking status: Former Research scientist (life sciences)  . Smokeless tobacco: Never Used  Substance Use Topics  . Alcohol use: No    FAMILY HISTORY:   Family History  Problem Relation Age of Onset  . Hypertension Mother     DRUG ALLERGIES:  No Known Allergies  REVIEW OF SYSTEMS:  Review of Systems  Constitutional: Positive for chills. Negative for fever and weight loss.  HENT: Negative for ear discharge, ear pain and nosebleeds.   Eyes: Negative for blurred vision, pain and discharge.  Respiratory: Positive for cough, sputum production, shortness of breath and wheezing. Negative for stridor.   Cardiovascular: Negative for chest pain, palpitations, orthopnea and PND.  Gastrointestinal: Negative for abdominal pain, diarrhea, nausea and vomiting.  Genitourinary: Negative for frequency and urgency.  Musculoskeletal: Negative for back pain and joint pain.  Neurological: Positive for weakness. Negative for sensory change, speech change and focal weakness.  Psychiatric/Behavioral: Negative for depression and hallucinations. The patient is not nervous/anxious.      MEDICATIONS AT HOME:   Prior to Admission medications   Medication Sig Start Date End Date Taking? Authorizing Provider  acetaminophen (TYLENOL) 325 MG tablet Take 650 mg by mouth every 6 (six) hours as needed.    Yes [provider]  albuterol (PROVENTIL HFA;VENTOLIN HFA) 108 (90 Base) MCG/ACT inhaler Inhale 1-2 puffs into the lungs every 6 (six) hours as  needed for wheezing or shortness of breath.   Yes [provider]  allopurinol (ZYLOPRIM) 100 MG tablet Take 1 tablet (100 mg total) by mouth daily. 01/11/18 01/11/19 Yes Dustin Flock, MD  amiodarone (PACERONE) 200 MG tablet Take 200 mg by mouth daily.   Yes [provider]  amLODipine (NORVASC) 10 MG tablet  Take 10 mg by mouth daily.   Yes [provider]  apixaban (ELIQUIS) 5 MG TABS tablet Take 5 mg by mouth 2 (two) times daily.   Yes [provider]  aspirin 81 MG chewable tablet Chew 81 mg by mouth daily.   Yes [provider]  brimonidine (ALPHAGAN) 0.15 % ophthalmic solution Place 1 drop into both eyes 3 (three) times daily. 12/20/17  Yes [provider]  budesonide-formoterol (SYMBICORT) 160-4.5 MCG/ACT inhaler Inhale 2 puffs into the lungs 2 (two) times daily.   Yes [provider]  cholecalciferol (VITAMIN D) 1000 units tablet Take 1,000 Units by mouth daily.   Yes [provider]  colchicine 0.6 MG tablet Take 1 tablet (0.6 mg total) by mouth 2 (two) times daily. 01/11/18 01/11/19 Yes Dustin Flock, MD  furosemide (LASIX) 40 MG tablet Take 40 mg by mouth 2 (two) times daily.    Yes [provider]  guaiFENesin (ROBITUSSIN) 100 MG/5ML SOLN Take 5 mLs (100 mg total) by mouth every 4 (four) hours as needed for cough or to loosen phlegm. 10/26/16  Yes Sudini, Alveta Heimlich, MD  latanoprost (XALATAN) 0.005 % ophthalmic solution Place 1 drop into both eyes at bedtime. 12/20/17  Yes [provider]  magnesium oxide (MAG-OX) 400 MG tablet Take 400 mg by mouth 2 (two) times daily.   Yes [provider]  metoprolol (TOPROL-XL) 200 MG 24 hr tablet Take 200 mg by mouth daily.   Yes [provider]  predniSONE (STERAPRED UNI-PAK 21 TAB) 10 MG (21) TBPK tablet 6 tabs day 1 and taper 10 mg a day - 6 days 08/25/18  Yes Sudini, Srikar, MD  spironolactone (ALDACTONE) 25 MG tablet Take 12.5 mg by mouth daily.   Yes [provider]  tiotropium (SPIRIVA HANDIHALER) 18 MCG inhalation capsule Place 1 capsule (18 mcg total) into inhaler and inhale daily. 08/25/18 09/24/18 Yes Sudini, Alveta Heimlich, MD  nitroGLYCERIN (NITROSTAT) 0.4 MG SL tablet Place 0.4 mg under the tongue every 5 (five) minutes as needed for chest pain.    [provider]      VITAL SIGNS:  Blood pressure 133/72, pulse 65, temperature 98.6 F (37 C), temperature source Oral, resp. rate 20, height 5\' 10"  (1.778 m), weight 98.4 kg, SpO2 90 %.  PHYSICAL EXAMINATION:  GENERAL:  72 y.o.-year-old patient lying in the bed with no acute distress.  EYES: Pupils equal, round, reactive to light and accommodation. No scleral icterus. Extraocular muscles intact.  HEENT: Head atraumatic, normocephalic. Oropharynx and nasopharynx clear.  NECK:  Supple, no jugular venous distention. No thyroid enlargement, no tenderness.  LUNGS: Normal breath sounds bilaterally, no wheezing, few bibasila rales,no rhonchi or crepitation. No use of accessory muscles of respiration.  CARDIOVASCULAR: S1, S2 normal. No murmurs, rubs, or gallops.  ABDOMEN: Soft, nontender, nondistended. Bowel sounds present. No organomegaly or mass.  EXTREMITIES: No pedal edema, cyanosis, or clubbing.  NEUROLOGIC: Cranial nerves II through XII are intact. Muscle strength 5/5 in all extremities. Sensation intact. Gait not checked.  PSYCHIATRIC: The patient is alert and oriented x 3.  SKIN: No obvious rash, lesion, or ulcer.   LABORATORY PANEL:  CBC Recent Labs  Lab 08/30/18 0830  WBC 24.5*  HGB 12.0*  HCT 37.2*  PLT 204   ------------------------------------------------------------------------------------------------------------------  Chemistries  Recent Labs  Lab 08/30/18 0830  NA 136  K 4.3  CL 98  CO2 28  GLUCOSE 132*  BUN 42*  CREATININE 1.72*  CALCIUM 8.4*  AST 33  ALT 46*  ALKPHOS 77  BILITOT 1.5*   ------------------------------------------------------------------------------------------------------------------  Cardiac Enzymes Recent Labs  Lab 08/30/18 1108  TROPONINI 0.20*   ------------------------------------------------------------------------------------------------------------------  RADIOLOGY:  Dg Chest Portable 1 View  Result Date:  08/30/2018 CLINICAL DATA:  Shortness of breath, COPD EXAM: PORTABLE CHEST 1 VIEW COMPARISON:  08/22/2018 FINDINGS: Bilateral mild interstitial thickening. Patchy alveolar airspace opacities in the left mid lung and left lower lobe. No pleural effusion or pneumothorax. Stable cardiomegaly. Dual lead cardiac pacemaker. No acute osseous abnormality. IMPRESSION: Cardiomegaly with mild pulmonary vascular congestion. Patchy alveolar airspace opacity in the left mid lung and left lower lobe concerning for pneumonia. Electronically Signed   By: Kathreen Devoid   On: 08/30/2018 09:13    EKG:    IMPRESSION AND PLAN:   Shawndale Kilpatrick  is a 72 y.o. male with a known history of congestive heart failure diastolic, chronic a fib on anticoagulation, CAD who was recently admitted and discharged for COPD exacerbation comes to the emergency room with increasing weakness possible fever with chills cough with congestion and productive phlegm.  1. acute on chronic hypoxic respiratory failure secondary to pneumonia in the setting of congestive heart failure acute on chronic diastolic -admit to telemetry -IV Lasix 20 mg BID -IV Cefepime -follow blood culture sputum culture -continue oxygen, PRN nebs, inhalers -patient currently not wheezing I will hold off on steroids -he already got a dose of IV Solu-Medrol and the emergency room  2. Pneumonia -treatment as above -Pro calcitonin 8.34  3. Chronic atrial fibrillation -continue cardiac meds and oral anticoagulation with eliquis  4. 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 Patient apparently had some issue getting additional oxygen at home last week  5. Hypertension -blood pressure on the softer side -will hold some of the blood pressure meds resume when appropriate  6. DVT prophylaxis on eliquis  7. Leukocytosis could be due to pneumonia. Patient was recently on prednisone taper as outpatient. -Continue to monitor  counts  No family in the room  All the records are reviewed and case discussed with ED provider. Management plans discussed with the patient, family and they are in agreement.  CODE STATUS: full  TOTAL TIME TAKING CARE OF THIS PATIENT: 50 minutes.    Fritzi Mandes M.D on 08/30/2018 at 1:55 PM  Between 7am to 6pm - Pager - [phone]  After 6pm go to [email or web address] - password [email or web address] [facility]  [facility]  Office  [phone]  CC: Primary care physician; [employer] [employer]

## 2018-08-30 NOTE — ED Notes (Signed)
ED TO INPATIENT HANDOFF REPORT  Name/Age/Gender Darius Taylor 72 y.o. male  Code Status Code Status History    Date Active Date Inactive Code Status Order ID Comments User Context   08/22/2018 1435 08/25/2018 2257 Full Code [id number]  Fritzi Mandes, MD Inpatient   01/08/2018 1523 01/11/2018 1712 Full Code [id number]  Nicholes Mango, MD ED   11/18/2017 2227 11/23/2017 2033 Full Code [id number]  Vaughan Basta, MD Inpatient   10/21/2016 1216 10/26/2016 2247 Full Code [id number]  Bettey Costa, MD Inpatient      Home/SNF/Other Home  Chief Complaint Difficulty Breathing   Level of Care/Admitting Diagnosis ED Disposition    ED Disposition Condition [facility]: [place] [[id number]]  Level of Care: Telemetry [5]  Diagnosis: Acute on chronic respiratory failure with hypoxemia [facility]) [[id number]]  Admitting Physician: Odessa Fleming  Attending Physician: Odessa Fleming  Estimated length of stay: past midnight tomorrow  Certification:: I certify this patient will need inpatient services for at least 2 midnights  PT Class (Do Not Modify): Inpatient [101]  PT Acc Code (Do Not Modify): Private [1]       Medical History Past Medical History:  Diagnosis Date  . Aortic stenosis   . Asthma   . Atrial fibrillation ([place])   . CAD (coronary artery disease)   . Cancer [facility])    colon, bladder and kidney  . COPD (chronic obstructive pulmonary disease) ([place])   . Hypertension     Allergies No Known Allergies  IV Location/Drains/Wounds Patient Lines/Drains/Airways Status   Active Line/Drains/Airways    Name:   Placement date:   Placement time:   Site:   Days:   Peripheral IV 08/30/18 Left Hand   08/30/18    -    Hand   less than 1   Peripheral IV 08/30/18 Left Forearm   08/30/18    0900    Forearm   less than 1          Labs/Imaging Results for orders placed or performed during the hospital encounter of 08/30/18 (from the past 48 hour(s))   Comprehensive metabolic panel     Status: Abnormal   Collection Time: 08/30/18  8:30 AM  Result Value Ref Range   Sodium 136 135 - 145 mmol/L   Potassium 4.3 3.5 - 5.1 mmol/L   Chloride 98 98 - 111 mmol/L   CO2 28 22 - 32 mmol/L   Glucose, Bld 132 (H) 70 - 99 mg/dL   BUN 42 (H) 8 - 23 mg/dL   Creatinine, Ser 1.72 (H) 0.61 - 1.24 mg/dL   Calcium 8.4 (L) 8.9 - 10.3 mg/dL   Total Protein 7.5 6.5 - 8.1 g/dL   Albumin 3.3 (L) 3.5 - 5.0 g/dL   AST 33 15 - 41 U/L   ALT 46 (H) 0 - 44 U/L   Alkaline Phosphatase 77 38 - 126 U/L   Total Bilirubin 1.5 (H) 0.3 - 1.2 mg/dL   GFR calc non Af Amer 38 (L) >60 mL/min   GFR calc Af Amer 44 (L) >60 mL/min   Anion gap 10 5 - 15    Comment: Performed at [facility], [address] [place], [place] [address]  Troponin I - Once     Status: Abnormal   Collection Time: 08/30/18  8:30 AM  Result Value Ref Range   Troponin I 0.17 (HH) <0.03 ng/mL    Comment: CRITICAL RESULT CALLED TO, READ BACK BY  AND VERIFIED WITH Francenia Chimenti AT 2641 08/30/18.PMF Performed at [facility] [address] [place], [place] [address]   Brain natriuretic peptide     Status: Abnormal   Collection Time: 08/30/18  8:30 AM  Result Value Ref Range   B Natriuretic Peptide 310.0 (H) 0.0 - 100.0 pg/mL    Comment: Performed at [facility], [address] [place], [place] [address]  CBC with Differential     Status: Abnormal   Collection Time: 08/30/18  8:30 AM  Result Value Ref Range   WBC 24.5 (H) 4.0 - 10.5 K/uL   RBC 3.91 (L) 4.22 - 5.81 MIL/uL   Hemoglobin 12.0 (L) 13.0 - 17.0 g/dL   HCT 37.2 (L) 39.0 - 52.0 %   MCV 95.1 80.0 - 100.0 fL   MCH 30.7 26.0 - 34.0 pg   MCHC 32.3 30.0 - 36.0 g/dL   RDW 15.2 11.5 - 15.5 %   Platelets 204 150 - 400 K/uL   nRBC 0.0 0.0 - 0.2 %   Neutrophils Relative % 82 %   Neutro Abs 20.2 (H) 1.7 - 7.7 K/uL   Lymphocytes Relative 5 %   Lymphs Abs 1.2 0.7 - 4.0 K/uL   Monocytes Relative 11 %    Monocytes Absolute 2.6 (H) 0.1 - 1.0 K/uL   Eosinophils Relative 0 %   Eosinophils Absolute 0.0 0.0 - 0.5 K/uL   Basophils Relative 0 %   Basophils Absolute 0.1 0.0 - 0.1 K/uL   Immature Granulocytes 2 %   Abs Immature Granulocytes 0.49 (H) 0.00 - 0.07 K/uL    Comment: Performed at [facility], [address] [place], [place] [address]  Procalcitonin - Baseline     Status: None   Collection Time: 08/30/18  8:30 AM  Result Value Ref Range   Procalcitonin 8.34 ng/mL    Comment:        Interpretation: PCT > 2 ng/mL: Systemic infection (sepsis) is likely, unless other causes are known. (NOTE)       Sepsis PCT Algorithm           Lower Respiratory Tract                                      Infection PCT Algorithm    ----------------------------     ----------------------------         PCT < 0.25 ng/mL                PCT < 0.10 ng/mL         Strongly encourage             Strongly discourage   discontinuation of antibiotics    initiation of antibiotics    ----------------------------     -----------------------------       PCT 0.25 - 0.50 ng/mL            PCT 0.10 - 0.25 ng/mL               OR       >80% decrease in PCT            Discourage initiation of  antibiotics      Encourage discontinuation           of antibiotics    ----------------------------     -----------------------------         PCT >= 0.50 ng/mL              PCT 0.26 - 0.50 ng/mL               AND       <80% decrease in PCT              Encourage initiation of                                             antibiotics       Encourage continuation           of antibiotics    ----------------------------     -----------------------------        PCT >= 0.50 ng/mL                  PCT > 0.50 ng/mL               AND         increase in PCT                  Strongly encourage                                      initiation of antibiotics    Strongly encourage  escalation           of antibiotics                                     -----------------------------                                           PCT <= 0.25 ng/mL                                                 OR                                        > 80% decrease in PCT                                     Discontinue / Do not initiate                                             antibiotics Performed at [facility], [address] [place], [place] [address]   Blood gas, venous     Status: Abnormal   Collection Time: 08/30/18  8:34 AM  Result Value Ref Range   pH, Ven 7.47 (H) 7.250 - 7.430   pCO2, Ven 41 (L) 44.0 - 60.0 mmHg   pO2, Ven 59.0 (H) 32.0 - 45.0 mmHg   Bicarbonate 29.8 (H) 20.0 - 28.0 mmol/L   Acid-Base Excess 5.6 (H) 0.0 - 2.0 mmol/L   O2 Saturation 91.9 %   Patient temperature 37.0    Collection site VEIN    Sample type VEIN     Comment: Performed at [facility], [place], [place] [address]  CG4 I-STAT (Lactic acid)     Status: None   Collection Time: 08/30/18  9:13 AM  Result Value Ref Range   Lactic Acid, Venous 1.25 0.5 - 1.9 mmol/L   Dg Chest Portable 1 View  Result Date: 08/30/2018 CLINICAL DATA:  Shortness of breath, COPD EXAM: PORTABLE CHEST 1 VIEW COMPARISON:  08/22/2018 FINDINGS: Bilateral mild interstitial thickening. Patchy alveolar airspace opacities in the left mid lung and left lower lobe. No pleural effusion or pneumothorax. Stable cardiomegaly. Dual lead cardiac pacemaker. No acute osseous abnormality. IMPRESSION: Cardiomegaly with mild pulmonary vascular congestion. Patchy alveolar airspace opacity in the left mid lung and left lower lobe concerning for pneumonia. Electronically Signed   By: Kathreen Devoid   On: 08/30/2018 09:13    Pending Labs Unresulted Labs (From admission, onward)    Start     Ordered   08/30/18 0857  Culture, blood (routine x 2)  BLOOD CULTURE X 2,   STAT     08/30/18 0856   Signed and  Held  Culture, sputum-assessment  Once,   R     Signed and Held   Signed and Held  Troponin I - Now Then Q6H  Now then every 6 hours,   R     Signed and Held   Signed and Held  CBC  Tomorrow morning,   R     Signed and Held          Vitals/Pain Today's Vitals   08/30/18 0832 08/30/18 0900 08/30/18 0915 08/30/18 1000  BP:   118/76 128/81  Pulse:  67 66 65  Resp:  19 18 (!) 23  Temp:      TempSrc:      SpO2:  92% 96% 90%  Weight: 98.4 kg     Height: 5\' 10"  (1.778 m)     PainSc: 0-No pain       Isolation Precautions No active isolations  Medications Medications  ceFEPIme (MAXIPIME) 2 g in sodium chloride 0.9 % 100 mL IVPB (has no administration in time range)  methylPREDNISolone sodium succinate (SOLU-MEDROL) 125 mg/2 mL injection 125 mg (125 mg Intravenous Given 08/30/18 0838)  ipratropium-albuterol (DUONEB) 0.5-2.5 (3) MG/3ML nebulizer solution 3 mL (3 mLs Nebulization Given 08/30/18 0838)  ceFEPIme (MAXIPIME) 1 g in sodium chloride 0.9 % 100 mL IVPB (0 g Intravenous Stopped 08/30/18 1004)  ipratropium-albuterol (DUONEB) 0.5-2.5 (3) MG/3ML nebulizer solution 3 mL (3 mLs Nebulization Given 08/30/18 0910)    Mobility walks

## 2018-08-30 NOTE — ED Provider Notes (Addendum)
[facility] Emergency Department Provider Note   ____________________________________________   First MD Initiated Contact with Patient 08/30/18 0825     (approximate)  I have reviewed the triage vital signs and the nursing notes.   HISTORY  Chief Complaint Shortness of Breath  Chief complaint shortness of breath  HPI Darius Taylor is a 72 y.o. male patient was recently in the hospital discharge last Saturday, a week ago.  Patient was discharged on 2 L of oxygen but was only given a small bottle and that ran out.  The person came to give him more oxygen but that bottle was not working.  Patient has developed a cough productive of some "dingy" phlegm and small amounts of blood streaking.  Patient is short of breath now worse than before.  On 2 L his O2 sats are only 85 at best.  EMS has given him 1 DuoNeb and 1 albuterol neb O2 sats are still 85 at best on 2 L.   Past Medical History:  Diagnosis Date  . Aortic stenosis   . Asthma   . Atrial fibrillation ([place])   . CAD (coronary artery disease)   . Cancer [facility])    colon, bladder and kidney  . COPD (chronic obstructive pulmonary disease) ([place])   . Hypertension     Patient Active Problem List   Diagnosis Date Noted  . History of colon cancer   . COPD with acute exacerbation ([place]) 01/08/2018  . Cellulitis 11/18/2017  . Hematoma   . HCAP (healthcare-associated pneumonia) 10/21/2016    Past Surgical History:  Procedure Laterality Date  . CARDIAC DEFIBRILLATOR PLACEMENT    . COLONOSCOPY WITH PROPOFOL N/A 06/10/2018   Procedure: COLONOSCOPY WITH PROPOFOL;  Surgeon: Lin Landsman, MD;  Location: [facility] ENDOSCOPY;  Service: Gastroenterology;  Laterality: N/A;  . CORONARY ANGIOPLASTY  09/25/2016  . IRRIGATION AND DEBRIDEMENT HEMATOMA Left 10/24/2016   Procedure: IRRIGATION AND DEBRIDEMENT HEMATOMA;  Surgeon: Florene Glen, MD;  Location: [facility] ORS;  Service: General;  Laterality: Left;  . KIDNEY  SURGERY     left removed  . PACEMAKER IMPLANT      Prior to Admission medications   Medication Sig Start Date End Date Taking? Authorizing Provider  acetaminophen (TYLENOL) 325 MG tablet Take 650 mg by mouth every 6 (six) hours as needed.     [provider]  albuterol (PROVENTIL HFA;VENTOLIN HFA) 108 (90 Base) MCG/ACT inhaler Inhale 1-2 puffs into the lungs every 6 (six) hours as needed for wheezing or shortness of breath.    [provider]  albuterol (PROVENTIL) (2.5 MG/3ML) 0.083% nebulizer solution Take 2.5 mg by nebulization every 6 (six) hours as needed for wheezing or shortness of breath.    [provider]  allopurinol (ZYLOPRIM) 100 MG tablet Take 1 tablet (100 mg total) by mouth daily. 01/11/18 01/11/19  Dustin Flock, MD  amiodarone (PACERONE) 200 MG tablet Take 200 mg by mouth daily.    [provider]  amLODipine (NORVASC) 10 MG tablet Take 10 mg by mouth daily.    [provider]  apixaban (ELIQUIS) 5 MG TABS tablet Take 5 mg by mouth 2 (two) times daily.    [provider]  aspirin 81 MG chewable tablet Chew 81 mg by mouth daily.    [provider]  brimonidine (ALPHAGAN) 0.15 % ophthalmic solution Place 1 drop into both eyes 3 (three) times daily. 12/20/17   [provider]  budesonide-formoterol (SYMBICORT) 160-4.5 MCG/ACT inhaler Inhale  2 puffs into the lungs 2 (two) times daily.    [provider]  cholecalciferol (VITAMIN D) 1000 units tablet Take 1,000 Units by mouth daily.    [provider]  colchicine 0.6 MG tablet Take 1 tablet (0.6 mg total) by mouth 2 (two) times daily. 01/11/18 01/11/19  Dustin Flock, MD  furosemide (LASIX) 40 MG tablet Take 40 mg by mouth 2 (two) times daily.     [provider]  guaiFENesin (ROBITUSSIN) 100 MG/5ML SOLN Take 5 mLs (100 mg total) by mouth every 4 (four) hours as needed for cough or to loosen phlegm. 10/26/16   [name], MD    latanoprost (XALATAN) 0.005 % ophthalmic solution Place 1 drop into both eyes at bedtime. 12/20/17   [provider]  magnesium oxide (MAG-OX) 400 MG tablet Take 400 mg by mouth 2 (two) times daily.    [provider]  metoprolol (TOPROL-XL) 200 MG 24 hr tablet Take 200 mg by mouth daily.    [provider]  nitroGLYCERIN (NITROSTAT) 0.4 MG SL tablet Place 0.4 mg under the tongue every 5 (five) minutes as needed for chest pain.    [provider]  predniSONE (STERAPRED UNI-PAK 21 TAB) 10 MG (21) TBPK tablet 6 tabs day 1 and taper 10 mg a day - 6 days 08/25/18   [name], MD  spironolactone (ALDACTONE) 25 MG tablet Take 12.5 mg by mouth daily.    [provider]  tiotropium (SPIRIVA HANDIHALER) 18 MCG inhalation capsule Place 1 capsule (18 mcg total) into inhaler and inhale daily. 08/25/18 09/24/18  [name], MD    Allergies Patient has no known allergies.  Family History  Problem Relation Age of Onset  . Hypertension Mother     Social History Social History   Tobacco Use  . Smoking status: Former Research scientist (life sciences)  . Smokeless tobacco: Never Used  Substance Use Topics  . Alcohol use: No  . Drug use: No    Review of Systems  Constitutional: No fever/chills Eyes: No visual changes. ENT: No sore throat. Cardiovascular: Denies chest pain. Respiratory: shortness of breath. Gastrointestinal: No abdominal pain.  No nausea, no vomiting.  No diarrhea.  No constipation. Genitourinary: Negative for dysuria. Musculoskeletal: Negative for back pain. Skin: Negative for rash. Neurological: Negative for headaches, focal weakness   ____________________________________________   PHYSICAL EXAM:  VITAL SIGNS: ED Triage Vitals  Enc Vitals Group     BP      Pulse      Resp      Temp      Temp src      SpO2      Weight      Height      Head Circumference      Peak Flow      Pain Score      Pain Loc      Pain Edu?      Excl. in [place]?      Constitutional: Alert and oriented. Well appearing and in no acute distress. Eyes: Conjunctivae are normal.  Head: Atraumatic. Nose: No congestion/rhinnorhea. Mouth/Throat: Mucous membranes are moist.  Oropharynx non-erythematous. Neck: No stridor.   Cardiovascular: Normal rate, regular rhythm. Grossly normal heart sounds.  Good peripheral circulation. Respiratory: Normal respiratory effort.  No retractions. Lungs scattered rhonchi and wheezes Gastrointestinal: Soft and nontender. No distention. No abdominal bruits. No CVA tenderness. Musculoskeletal: No lower extremity tenderness nor edema.   Neurologic:  Normal speech and language. No gross focal neurologic  deficits are appreciated.  Skin:  Skin is warm, dry and intact. No rash noted. Psychiatric: Mood and affect are normal. Speech and behavior are normal.  ____________________________________________   LABS (all labs ordered are listed, but only abnormal results are displayed)  Labs Reviewed  COMPREHENSIVE METABOLIC PANEL - Abnormal; Notable for the following components:      Result Value   Glucose, Bld 132 (*)    BUN 42 (*)    Creatinine, Ser 1.72 (*)    Calcium 8.4 (*)    Albumin 3.3 (*)    ALT 46 (*)    Total Bilirubin 1.5 (*)    GFR calc non Af Amer 38 (*)    GFR calc Af Amer 44 (*)    All other components within normal limits  TROPONIN I - Abnormal; Notable for the following components:   Troponin I 0.17 (*)    All other components within normal limits  CBC WITH DIFFERENTIAL/PLATELET - Abnormal; Notable for the following components:   WBC 24.5 (*)    RBC 3.91 (*)    Hemoglobin 12.0 (*)    HCT 37.2 (*)    Neutro Abs 20.2 (*)    Monocytes Absolute 2.6 (*)    Abs Immature Granulocytes 0.49 (*)    All other components within normal limits  CULTURE, BLOOD (ROUTINE X 2)  CULTURE, BLOOD (ROUTINE X 2)  BRAIN NATRIURETIC PEPTIDE  BLOOD GAS, VENOUS  I-STAT CG4 LACTIC ACID, ED    ____________________________________________  EKG EKG read interpreted by me shows normal sinus rhythm rate of 69 left axis low amplitude of the complexes in the lateral chest leads no acute ST-T wave changes looks similar to EKG from last week except for that his pacemaker is not currently firing.  ____________________________________________  RADIOLOGY  ED MD interpretation:   Official radiology report(s): No results found.  ____________________________________________   PROCEDURES  Procedure(s) performed:   Procedures  Critical Care performed: Medical care time half an hour this includes evaluating the patient reviewing his old records in some detail and discussing the patient with the hospitalist.  ____________________________________________   INITIAL IMPRESSION / [place] / ED COURSE  Patient's troponin is 0.17 this is slightly higher than in the past weeks troponin of 0.15 looking through the old records it seems as though this was felt due to strain.  Patient's white count is much higher than it was on his x-ray it appears to me as though his heart is larger than previously and he seems to be a small focal infiltrate that was not there previously.  Waiting on the radiologist report.  In the meantime we will get him treated for healthcare associated pneumonia.  His oxygen level is still low after 2 nebs even on his 2 L that he supposed to be on.      ____________________________________________   FINAL CLINICAL IMPRESSION(S) / ED DIAGNOSES  Final diagnoses:  COPD exacerbation ([place])  Hypoxia  HCAP (healthcare-associated pneumonia)  Elevated troponin     ED Discharge Orders    None       Note:  This document was prepared using Dragon voice recognition software and may include unintentional dictation errors.    Nena Polio, MD 08/30/18 4401    Nena Polio, MD 09/07/18 1600

## 2018-08-30 NOTE — Plan of Care (Signed)

## 2018-08-30 NOTE — Consult Note (Signed)
Pharmacy Antibiotic Note  Darius Taylor is a 72 y.o. male admitted on 08/30/2018 with pneumonia.  Pharmacy has been consulted for Cefepime dosing.  Plan: Start Cefepime 2g IV every 12 hours   Height: 5\' 10"  (177.8 cm) Weight: 216 lb 14.9 oz (98.4 kg) IBW/kg (Calculated) : 73  Temp (24hrs), Avg:98.6 F (37 C), Min:98.6 F (37 C), Max:98.6 F (37 C)  Recent Labs  Lab 08/30/18 0830 08/30/18 0913  WBC 24.5*  --   CREATININE 1.72*  --   LATICACIDVEN  --  1.25    Estimated Creatinine Clearance: 44.3 mL/min (A) (by C-G formula based on SCr of 1.72 mg/dL (H)).    No Known Allergies  Antimicrobials this admission: 12/7 Cefepime >>   Microbiology results: 12/7 BCx: pending  Thank you for allowing pharmacy to be a part of this patient's care.  Pernell Dupre, PharmD, BCPS Clinical Pharmacist 08/30/2018 9:52 AM

## 2018-08-30 NOTE — ED Triage Notes (Signed)
Pt just recently discharged from hostpial after COPD flare. Discharged on home O2. That ran out earlier this week. Pt has been without O2. Supposed to be on O2. Called EMS for [facility].  On 2L [place] sats 85 %.  Increased to 4 L [place]. sats remain 90-91 on 4 L.  Unlabored but wheezing present.

## 2018-08-31 LAB — CBC
HEMATOCRIT: 34.2 % — AB (ref 39.0–52.0)
Hemoglobin: 11 g/dL — ABNORMAL LOW (ref 13.0–17.0)
MCH: 30.6 pg (ref 26.0–34.0)
MCHC: 32.2 g/dL (ref 30.0–36.0)
MCV: 95 fL (ref 80.0–100.0)
Platelets: 199 10*3/uL (ref 150–400)
RBC: 3.6 MIL/uL — AB (ref 4.22–5.81)
RDW: 14.8 % (ref 11.5–15.5)
WBC: 20.9 10*3/uL — ABNORMAL HIGH (ref 4.0–10.5)
nRBC: 0 % (ref 0.0–0.2)

## 2018-08-31 LAB — TROPONIN I: Troponin I: 0.14 ng/mL (ref <0.03)

## 2018-08-31 MED ORDER — TRAMADOL HCL 50 MG PO TABS
50.0000 mg | ORAL_TABLET | Freq: Three times a day (TID) | ORAL | Status: DC | PRN
  Administered 2018-08-31: 50 mg via ORAL
  Filled 2018-08-31: qty 1

## 2018-08-31 MED ORDER — FUROSEMIDE 40 MG PO TABS
40.0000 mg | ORAL_TABLET | Freq: Two times a day (BID) | ORAL | Status: DC
  Administered 2018-08-31 – 2018-09-02 (×5): 40 mg via ORAL
  Filled 2018-08-31 (×5): qty 1

## 2018-08-31 NOTE — Progress Notes (Signed)
[place] [facility] [place] NAME: Darius Taylor    MR#:  [id number]  DATE OF BIRTH:  Mar 04, 1946  SUBJECTIVE:   Out in the chair. Cough with mild bloody phlegm REVIEW OF SYSTEMS:   Review of Systems  Constitutional: Negative for chills, fever and weight loss.  HENT: Negative for ear discharge, ear pain and nosebleeds.   Eyes: Negative for blurred vision, pain and discharge.  Respiratory: Positive for cough and hemoptysis. Negative for sputum production, shortness of breath, wheezing and stridor.   Cardiovascular: Negative for chest pain, palpitations, orthopnea and PND.  Gastrointestinal: Negative for abdominal pain, diarrhea, nausea and vomiting.  Genitourinary: Negative for frequency and urgency.  Musculoskeletal: Negative for back pain and joint pain.  Neurological: Negative for sensory change, speech change, focal weakness and weakness.  Psychiatric/Behavioral: Negative for depression and hallucinations. The patient is not nervous/anxious.    Tolerating Diet:yes Tolerating PT: no PT at home  DRUG ALLERGIES:  No Known Allergies  VITALS:  Blood pressure (!) 151/79, pulse 73, temperature 98.2 F (36.8 C), temperature source Oral, resp. rate 18, height 5\' 10"  (1.778 m), weight 94.4 kg, SpO2 93 %.  PHYSICAL EXAMINATION:   Physical Exam  GENERAL:  72 y.o.-year-old patient lying in the bed with no acute distress.  EYES: Pupils equal, round, reactive to light and accommodation. No scleral icterus. Extraocular muscles intact.  HEENT: Head atraumatic, normocephalic. Oropharynx and nasopharynx clear.  NECK:  Supple, no jugular venous distention. No thyroid enlargement, no tenderness.  LUNGS: Normal breath sounds bilaterally, no wheezing, rales, rhonchi. No use of accessory muscles of respiration.  CARDIOVASCULAR: S1, S2 normal. No murmurs, rubs, or gallops.  ABDOMEN: Soft, nontender, nondistended. Bowel sounds present. No organomegaly or  mass.  EXTREMITIES: No cyanosis, clubbing or edema b/l.    NEUROLOGIC: Cranial nerves II through XII are intact. No focal Motor or sensory deficits b/l.   PSYCHIATRIC:  patient is alert and oriented x 3.  SKIN: No obvious rash, lesion, or ulcer.   LABORATORY PANEL:  CBC Recent Labs  Lab 08/31/18 0327  WBC 20.9*  HGB 11.0*  HCT 34.2*  PLT 199    Chemistries  Recent Labs  Lab 08/30/18 0830  NA 136  K 4.3  CL 98  CO2 28  GLUCOSE 132*  BUN 42*  CREATININE 1.72*  CALCIUM 8.4*  AST 33  ALT 46*  ALKPHOS 77  BILITOT 1.5*   Cardiac Enzymes Recent Labs  Lab 08/30/18 2231  TROPONINI 0.14*   RADIOLOGY:  Dg Chest Portable 1 View  Result Date: 08/30/2018 CLINICAL DATA:  Shortness of breath, COPD EXAM: PORTABLE CHEST 1 VIEW COMPARISON:  08/22/2018 FINDINGS: Bilateral mild interstitial thickening. Patchy alveolar airspace opacities in the left mid lung and left lower lobe. No pleural effusion or pneumothorax. Stable cardiomegaly. Dual lead cardiac pacemaker. No acute osseous abnormality. IMPRESSION: Cardiomegaly with mild pulmonary vascular congestion. Patchy alveolar airspace opacity in the left mid lung and left lower lobe concerning for pneumonia. Electronically Signed   By: Kathreen Devoid   On: 08/30/2018 09:13   ASSESSMENT AND PLAN:   Darius Taylor  is a 72 y.o. male with a known history of congestive heart failure diastolic, chronic a fib on anticoagulation, CAD who was recently admitted and discharged for COPD exacerbation comes to the emergency room with increasing weakness possible fever with chills cough with congestion and productive phlegm.  1. acute on chronic hypoxic respiratory failure secondary to pneumonia in the setting  of congestive heart failure acute on chronic diastolic  -IV Lasix 20 mg BID--diuresed well. Change to oral lasix home dose -IV Cefepime -follow blood culture sputum culture -continue oxygen, PRN nebs, inhalers -patient currently not wheezing I  will hold off on steroids -he already got a dose of IV Solu-Medrol and the emergency room  2. Pneumonia -treatment as above -Pro calcitonin 8.34  3. Chronic atrial fibrillation -continue cardiac meds and oral anticoagulation with eliquis -mild hemoptysis--cont to monitor hgb  4. 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 Patient apparently had some issue getting additional oxygen at home last week  5. Hypertension -blood pressure on the softer side -will hold some of the blood pressure meds resume when appropriate  6. DVT prophylaxis on eliquis Case discussed with Care Management/Social Worker. Management plans discussed with the patient, family and they are in agreement.  CODE STATUS: full  DVT Prophylaxis: eleiquis  TOTAL TIME TAKING CARE OF THIS PATIENT: *30* minutes.  >50% time spent on counselling and coordination of care  POSSIBLE D/C IN *1-2* DAYS, DEPENDING ON CLINICAL CONDITION.  Note: This dictation was prepared with Dragon dictation along with smaller phrase technology. Any transcriptional errors that result from this process are unintentional.  Fritzi Mandes M.D on 08/31/2018 at 4:16 PM  Between 7am to 6pm - Pager - [phone]  After 6pm go to [email or web address] - password [email or web address] [place] [facility]  Office  [phone]  CC: Primary care physician; Inc, Lewis ServicesPatient ID: Darius Taylor, male   DOB: 29-Jun-1946, 72 y.o.   MRN: [id number]

## 2018-08-31 NOTE — Evaluation (Signed)
Physical Therapy Evaluation Patient Details Name: Darius Taylor MRN: [id number] DOB: April 05, 1946 Today's Date: 08/31/2018   History of Present Illness  Pt is a 72 y.o. male presenting to hospital 08/30/18 with acute on chronic hypoxic respiratory failure secondary PNA.  Of note, pt with recent hospital discharge about 1 week ago for COPD exacerbation.  PMH includes a-fib, CAD, colon CA, bladder CA, kidney CA, COPD, htn, pacemaker, CHF.  Clinical Impression  Prior to hospital admission, pt was independent with ambulation; on 2 L home O2.  Pt lives alone in 1 level home with 3 steps to enter with B railings; daughter lives next door and assists as needed (as well as neighbors).  Currently pt is modified independent semi-supine to sit; SBA with transfers; and CGA with ambulation around nursing loop (no AD).  Pt initially mildly unsteady but improved balance and gait mechanics noted with increased distance ambulating.  Pt's O2 sats decreased to 90% with ambulation but increased back to 94% with sitting rest (all on 4 L O2 via nasal cannula).  Pt would benefit from skilled PT to address noted impairments and functional limitations (see below for any additional details).  Upon hospital discharge, recommend pt discharge to home; no further PT needs anticipated upon hospital discharge.    Follow Up Recommendations No PT follow up    Equipment Recommendations  None recommended by PT    Recommendations for Other Services       Precautions / Restrictions Precautions Precautions: Fall Restrictions Weight Bearing Restrictions: No      Mobility  Bed Mobility Overal bed mobility: Modified Independent             General bed mobility comments: Semi-supine to sit without any noted difficulties.  Transfers Overall transfer level: Needs assistance Equipment used: None Transfers: Sit to/from Stand Sit to Stand: Supervision         General transfer comment: mild increased effort to stand  from bed  Ambulation/Gait Ambulation/Gait assistance: Min guard Gait Distance (Feet): 200 Feet Assistive device: None   Gait velocity: decreased initially but increased with distance ambulated   General Gait Details: mild increased BOS; decreased B step length and mildly unsteady initially but improved step length and improved balance noted with increased distance ambulated  Stairs            Wheelchair Mobility    Modified Rankin (Stroke Patients Only)       Balance Overall balance assessment: Needs assistance Sitting-balance support: No upper extremity supported;Feet supported Sitting balance-Leahy Scale: Normal Sitting balance - Comments: steady sitting reaching outside BOS   Standing balance support: No upper extremity supported Standing balance-Leahy Scale: Normal Standing balance comment: steady standing reaching outside BOS                             Pertinent Vitals/Pain Pain Assessment: No/denies pain  HR WFL during session.    Home Living Family/patient expects to be discharged to:: Private residence Living Arrangements: Alone Available Help at Discharge: Family(Daughter lives next door and assists as needed (as well as neighbors)) Type of Home: House Home Access: Stairs to enter Entrance Stairs-Rails: Right;Left;Can reach both Technical brewer of Steps: 3 Home Layout: One [place]: None      Prior Function Level of Independence: Independent         Comments: Pt reports no falls in past 6 months.  2 L home O2.     Hand Dominance  Extremity/Trunk Assessment   Upper Extremity Assessment Upper Extremity Assessment: Generalized weakness    Lower Extremity Assessment Lower Extremity Assessment: Generalized weakness    Cervical / Trunk Assessment Cervical / Trunk Assessment: Normal  Communication   Communication: No difficulties  Cognition Arousal/Alertness: Awake/alert Behavior During Therapy:  WFL for tasks assessed/performed Overall Cognitive Status: Within Functional Limits for tasks assessed                                        General Comments   Nursing cleared pt for participation in physical therapy.  Pt agreeable to PT session.    Exercises  Pursed lip breathing during and after ambulation.   Assessment/Plan    PT Assessment Patient needs continued PT services  PT Problem List Decreased strength;Decreased mobility;Cardiopulmonary status limiting activity       PT Treatment Interventions DME instruction;Gait training;Stair training;Functional mobility training;Therapeutic activities;Therapeutic exercise;Balance training;Patient/family education    PT Goals (Current goals can be found in the Care Plan section)  Acute Rehab PT Goals Patient Stated Goal: to go home PT Goal Formulation: With patient Time For Goal Achievement: 09/14/18 Potential to Achieve Goals: Good    Frequency Min 2X/week   Barriers to discharge        Co-evaluation               AM-PAC PT "6 Clicks" Mobility  Outcome Measure Help needed turning from your back to your side while in a flat bed without using bedrails?: None Help needed moving from lying on your back to sitting on the side of a flat bed without using bedrails?: None Help needed moving to and from a bed to a chair (including a wheelchair)?: A Little Help needed standing up from a chair using your arms (e.g., wheelchair or bedside chair)?: A Little Help needed to walk in hospital room?: A Little Help needed climbing 3-5 steps with a railing? : A Little 6 Click Score: 20    End of Session Equipment Utilized During Treatment: Gait belt;Oxygen(4 L O2 via nasal cannula) Activity Tolerance: Patient tolerated treatment well Patient left: in chair;with call bell/phone within reach;Other (comment)(Pt fall risk score of 9 (no chair alarm need: discussed with pt's nurse)) Nurse Communication: Mobility  status;Precautions PT Visit Diagnosis: Other abnormalities of gait and mobility (R26.89);Muscle weakness (generalized) (M62.81)    Time: 2130-8657 PT Time Calculation (min) (ACUTE ONLY): 17 min   Charges:   PT Evaluation $PT Eval Low Complexity: 1 Low PT Treatments $Therapeutic Exercise: 8-22 mins       Leitha Bleak, PT 08/31/18, 9:17 AM [phone]

## 2018-08-31 NOTE — Progress Notes (Addendum)
Pt complaining of left sided chest pressure, BP  151/73, NSR on tele no  changes HR 73, SpO2 93% on 3 L N/C, per pt pain is coming down 4/10 chest pressure. Pt has been coughing up mild bloody phlegm. Dr. Posey Pronto notified and received verbal orders of PRN Ultram. Will give and continue to monitor.

## 2018-09-01 LAB — CBC
HCT: 34.4 % — ABNORMAL LOW (ref 39.0–52.0)
Hemoglobin: 10.8 g/dL — ABNORMAL LOW (ref 13.0–17.0)
MCH: 30.4 pg (ref 26.0–34.0)
MCHC: 31.4 g/dL (ref 30.0–36.0)
MCV: 96.9 fL (ref 80.0–100.0)
Platelets: 189 10*3/uL (ref 150–400)
RBC: 3.55 MIL/uL — ABNORMAL LOW (ref 4.22–5.81)
RDW: 15.1 % (ref 11.5–15.5)
WBC: 16.9 10*3/uL — ABNORMAL HIGH (ref 4.0–10.5)
nRBC: 0 % (ref 0.0–0.2)

## 2018-09-01 MED ORDER — LEVOFLOXACIN 500 MG PO TABS
500.0000 mg | ORAL_TABLET | Freq: Every day | ORAL | Status: DC
  Administered 2018-09-01 – 2018-09-02 (×2): 500 mg via ORAL
  Filled 2018-09-01 (×3): qty 1

## 2018-09-01 NOTE — Progress Notes (Signed)
Darius Taylor at [place] NAME: Darius Taylor    MR#:  [id number]  DATE OF BIRTH:  1946-09-03  SUBJECTIVE:   Out in the chair. Cough with mild bloody phlegm REVIEW OF SYSTEMS:   Review of Systems  Constitutional: Negative for chills, fever and weight loss.  HENT: Negative for ear discharge, ear pain and nosebleeds.   Eyes: Negative for blurred vision, pain and discharge.  Respiratory: Positive for cough and hemoptysis. Negative for sputum production, shortness of breath, wheezing and stridor.   Cardiovascular: Negative for chest pain, palpitations, orthopnea and PND.  Gastrointestinal: Negative for abdominal pain, diarrhea, nausea and vomiting.  Genitourinary: Negative for frequency and urgency.  Musculoskeletal: Negative for back pain and joint pain.  Neurological: Negative for sensory change, speech change, focal weakness and weakness.  Psychiatric/Behavioral: Negative for depression and hallucinations. The patient is not nervous/anxious.    Tolerating Diet:yes Tolerating PT: no PT at home  DRUG ALLERGIES:  No Known Allergies  VITALS:  Blood pressure 134/60, pulse 66, temperature 98.2 F (36.8 C), temperature source Oral, resp. rate 19, height 5\' 10"  (1.778 m), weight 95.4 kg, SpO2 93 %.  PHYSICAL EXAMINATION:   Physical Exam  GENERAL:  72 y.o.-year-old patient lying in the bed with no acute distress.  EYES: Pupils equal, round, reactive to light and accommodation. No scleral icterus. Extraocular muscles intact.  HEENT: Head atraumatic, normocephalic. Oropharynx and nasopharynx clear.  NECK:  Supple, no jugular venous distention. No thyroid enlargement, no tenderness.  LUNGS: Normal breath sounds bilaterally, no wheezing, rales, rhonchi. No use of accessory muscles of respiration.  CARDIOVASCULAR: S1, S2 normal. No murmurs, rubs, or gallops.  ABDOMEN: Soft, nontender, nondistended. Bowel sounds present. No organomegaly or mass.   EXTREMITIES: No cyanosis, clubbing or edema b/l.    NEUROLOGIC: Cranial nerves II through XII are intact. No focal Motor or sensory deficits b/l.   PSYCHIATRIC:  patient is alert and oriented x 3.  SKIN: No obvious rash, lesion, or ulcer.   LABORATORY PANEL:  CBC Recent Labs  Lab 09/01/18 0414  WBC 16.9*  HGB 10.8*  HCT 34.4*  PLT 189    Chemistries  Recent Labs  Lab 08/30/18 0830  NA 136  K 4.3  CL 98  CO2 28  GLUCOSE 132*  BUN 42*  CREATININE 1.72*  CALCIUM 8.4*  AST 33  ALT 46*  ALKPHOS 77  BILITOT 1.5*   Cardiac Enzymes Recent Labs  Lab 08/30/18 2231  TROPONINI 0.14*   RADIOLOGY:  No results found. ASSESSMENT AND PLAN:   Darius Taylor  is a 72 y.o. male with a known history of congestive heart failure diastolic, chronic a fib on anticoagulation, CAD who was recently admitted and discharged for COPD exacerbation comes to the emergency room with increasing weakness possible fever with chills cough with congestion and productive phlegm.  1. acute on chronic hypoxic respiratory failure secondary to pneumonia in the setting of congestive heart failure acute on chronic diastolic -IV Lasix 20 mg BID--diuresed well. Change to oral lasix home dose -IV Cefepime--changed to po levaquin -continue oxygen, PRN nebs, inhalers -patient currently not wheezing I will hold off on steroids -he already got a dose of IV Solu-Medrol and the emergency room  2. Pneumonia -treatment as above -Pro calcitonin 8.34  3. Chronic atrial fibrillation -continue cardiac meds and oral anticoagulation with eliquis -very mild hemoptysis--cont to monitor hgb stable at 10.6  4. 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 Patient apparently had some issue getting additional oxygen at home last week  5. Hypertension -blood pressure on the softer side -will hold some of the blood pressure meds resume when appropriate  6. DVT prophylaxis on  eliquis  Pt overall improved. He wants to stay one more day.  Case discussed with Care Management/Social Worker. Management plans discussed with the patient, family and they are in agreement.  CODE STATUS: full  DVT Prophylaxis: eleiquis  TOTAL TIME TAKING CARE OF THIS PATIENT: *30* minutes.  >50% time spent on counselling and coordination of care  POSSIBLE D/C IN *1* DAYS, DEPENDING ON CLINICAL CONDITION.  Note: This dictation was prepared with Dragon dictation along with smaller phrase technology. Any transcriptional errors that result from this process are unintentional.  Fritzi Mandes M.D on 09/01/2018 at 12:15 PM  Between 7am to 6pm - Pager - [phone]  After 6pm go to [email or web address] - password [email or web address] [facility] [place] [facility]  Office  [phone]  CC: Primary care physician; Inc, [place] ServicesPatient ID: Darius Taylor, male   DOB: 11-09-1945, 72 y.o.   MRN: [id number]

## 2018-09-01 NOTE — Care Management (Signed)
Barrier to discharge-Continues on IV lasix 20mg  and IV cefepime.

## 2018-09-01 NOTE — Plan of Care (Signed)
  Problem: Clinical Measurements: Goal: Respiratory complications will improve Outcome: Progressing   Problem: Pain Managment: Goal: General experience of comfort will improve Outcome: Progressing   Problem: Activity: Goal: Capacity to carry out activities will improve Outcome: Progressing

## 2018-09-02 LAB — CULTURE, RESPIRATORY W GRAM STAIN: Culture: NORMAL

## 2018-09-02 MED ORDER — LEVOFLOXACIN 500 MG PO TABS: 500.0000 mg | ORAL_TABLET | Freq: Every day | ORAL | 0 refills | Status: DC

## 2018-09-02 MED ORDER — BISMUTH SUBSALICYLATE 262 MG/15ML PO SUSP
30.0000 mL | ORAL | Status: DC | PRN
  Filled 2018-09-02 (×2): qty 118

## 2018-09-02 NOTE — Progress Notes (Signed)
IV and tele removed from patient. Discharge instructions given to patient along. Verbalized understanding. No acute distress. Patient persistant upon being taken down to visitors entrance to wait on ride. Patient taken to visitors entrance.

## 2018-09-02 NOTE — Care Management Important Message (Signed)
Copy of signed IM left with patient in room.  

## 2018-09-02 NOTE — Plan of Care (Signed)
  Problem: Clinical Measurements: Goal: Diagnostic test results will improve Outcome: Progressing   Problem: Clinical Measurements: Goal: Respiratory complications will improve Outcome: Not Progressing Note:  Patient requiring 3L of oxygen continuously

## 2018-09-02 NOTE — Discharge Summary (Signed)
Darius Taylor at [place] NAME: Darius Taylor    MR#:  [id number]  DATE OF BIRTH:  1945/12/20  DATE OF ADMISSION:  08/30/2018 ADMITTING PHYSICIAN: Fritzi Mandes, MD  DATE OF DISCHARGE: 09/02/2018  PRIMARY CARE PHYSICIAN: [facility], [place]    ADMISSION DIAGNOSIS:  Hypoxia [R09.02] Elevated troponin [R79.89] COPD exacerbation (HCC) [J44.1] HCAP (healthcare-associated pneumonia) [J18.9]  DISCHARGE DIAGNOSIS:  Pneumonia acute on chronic congestive heart failure diastolic  SECONDARY DIAGNOSIS:   Past Medical History:  Diagnosis Date  . Aortic stenosis   . Asthma   . Atrial fibrillation ([place])   . CAD (coronary artery disease)   . Cancer [facility])    colon, bladder and kidney  . COPD (chronic obstructive pulmonary disease) ([place])   . Hypertension     HOSPITAL COURSE:   EdwardSwiftis a44 y.o.malewith a known history of congestive heart failure diastolic, chronic a fib on anticoagulation, CAD who was recently admitted and discharged for COPD exacerbation comes to the emergency room with increasing weakness possible fever with chills cough with congestion and productive phlegm.  1.acute on chronic hypoxic respiratory failure secondary to pneumonia in the setting of congestive heart failure acute on chronic diastolic -IV Lasix 20 mg BID--diuresed well. Change to oral lasix home dose -IVCefepime--changed to po levaquin -continue oxygen, PRN nebs, inhalers -patient currently not wheezing I will hold off on steroids -he already got a dose of IV Solu-Medrol and the emergency room  2.Pneumonia -treatment as above -Pro calcitonin 8.34  3.Chronic atrial fibrillation -continue cardiac meds and oral anticoagulation with eliquis -very mild hemoptysis--cont to monitor hgb stable at 10.6. Fading out. 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 Patient apparently had some issue getting additional oxygen at home last week  5.Hypertension -blood pressure on the softer side -will hold some of the blood pressure meds resume when appropriate  6.DVT prophylaxis on eliquis  Pt overall improved. D/c home with oxygen  CONSULTS OBTAINED:    DRUG ALLERGIES:  No Known Allergies  DISCHARGE MEDICATIONS:   Allergies as of 09/02/2018   No Known Allergies     Medication List    STOP taking these medications   predniSONE 10 MG (21) Tbpk tablet Commonly known as:  STERAPRED UNI-PAK 21 TAB     TAKE these medications   acetaminophen 325 MG tablet Commonly known as:  TYLENOL Take 650 mg by mouth every 6 (six) hours as needed.   albuterol 108 (90 Base) MCG/ACT inhaler Commonly known as:  PROVENTIL HFA;VENTOLIN HFA Inhale 1-2 puffs into the lungs every 6 (six) hours as needed for wheezing or shortness of breath.   allopurinol 100 MG tablet Commonly known as:  ZYLOPRIM Take 1 tablet (100 mg total) by mouth daily.   amiodarone 200 MG tablet Commonly known as:  PACERONE Take 200 mg by mouth daily.   amLODipine 10 MG tablet Commonly known as:  NORVASC Take 10 mg by mouth daily.   aspirin 81 MG chewable tablet Chew 81 mg by mouth daily.   brimonidine 0.15 % ophthalmic solution Commonly known as:  ALPHAGAN Place 1 drop into both eyes 3 (three) times daily.   budesonide-formoterol 160-4.5 MCG/ACT inhaler Commonly known as:  SYMBICORT Inhale 2 puffs into the lungs 2 (two) times daily.   cholecalciferol 1000 units tablet Commonly known as:  VITAMIN D Take 1,000 Units by mouth daily.  colchicine 0.6 MG tablet Take 1 tablet (0.6 mg total) by mouth 2 (two) times daily.   ELIQUIS 5 MG Tabs tablet Generic drug:  apixaban Take 5 mg by mouth 2 (two) times daily.   furosemide 40 MG tablet Commonly known as:  LASIX Take 40 mg by mouth 2 (two) times daily.   guaiFENesin 100 MG/5ML Soln Commonly known as:   ROBITUSSIN Take 5 mLs (100 mg total) by mouth every 4 (four) hours as needed for cough or to loosen phlegm.   latanoprost 0.005 % ophthalmic solution Commonly known as:  XALATAN Place 1 drop into both eyes at bedtime.   levofloxacin 500 MG tablet Commonly known as:  LEVAQUIN Take 1 tablet (500 mg total) by mouth daily. Start taking on:  09/03/2018   magnesium oxide 400 MG tablet Commonly known as:  MAG-OX Take 400 mg by mouth 2 (two) times daily.   metoprolol 200 MG 24 hr tablet Commonly known as:  TOPROL-XL Take 200 mg by mouth daily. Notes to patient:  Hold the dose if your BP is <120   nitroGLYCERIN 0.4 MG SL tablet Commonly known as:  NITROSTAT Place 0.4 mg under the tongue every 5 (five) minutes as needed for chest pain.   spironolactone 25 MG tablet Commonly known as:  ALDACTONE Take 12.5 mg by mouth daily.   tiotropium 18 MCG inhalation capsule Commonly known as:  SPIRIVA Place 1 capsule (18 mcg total) into inhaler and inhale daily.       If you experience worsening of your admission symptoms, develop shortness of breath, life threatening emergency, suicidal or homicidal thoughts you must seek medical attention immediately by calling 911 or calling your MD immediately  if symptoms less severe.  You Must read complete instructions/literature along with all the possible adverse reactions/side effects for all the Medicines you take and that have been prescribed to you. Take any new Medicines after you have completely understood and accept all the possible adverse reactions/side effects.   Please note  You were cared for by a hospitalist during your hospital stay. If you have any questions about your discharge medications or the care you received while you were in the hospital after you are discharged, you can call the unit and asked to speak with the hospitalist on call if the hospitalist that took care of you is not available. Once you are discharged, your primary care  physician will handle any further medical issues. Please note that NO REFILLS for any discharge medications will be authorized once you are discharged, as it is imperative that you return to your primary care physician (or establish a relationship with a primary care physician if you do not have one) for your aftercare needs so that they can reassess your need for medications and monitor your lab values.   DATA REVIEW:   CBC  Recent Labs  Lab 09/01/18 0414  WBC 16.9*  HGB 10.8*  HCT 34.4*  PLT 189    Chemistries  Recent Labs  Lab 08/30/18 0830  NA 136  K 4.3  CL 98  CO2 28  GLUCOSE 132*  BUN 42*  CREATININE 1.72*  CALCIUM 8.4*  AST 33  ALT 46*  ALKPHOS 77  BILITOT 1.5*    Microbiology Results   Recent Results (from the past 240 hour(s))  Culture, blood (routine x 2)     Status: None (Preliminary result)   Collection Time: 08/30/18  9:00 AM  Result Value Ref Range Status   Specimen Description  BLOOD LFA  Final   Special Requests   Final    BOTTLES DRAWN AEROBIC AND ANAEROBIC Blood Culture adequate volume   Culture   Final    NO GROWTH 3 DAYS Performed at [facility], [address] [place], [place] [address]    Report Status PENDING  Incomplete  Culture, blood (routine x 2)     Status: None (Preliminary result)   Collection Time: 08/30/18  9:04 AM  Result Value Ref Range Status   Specimen Description BLOOD R AC  Final   Special Requests   Final    BOTTLES DRAWN AEROBIC AND ANAEROBIC Blood Culture results may not be optimal due to an excessive volume of blood received in culture bottles   Culture   Final    NO GROWTH 3 DAYS Performed at [name], [address], [place], [place] [address]    Report Status PENDING  Incomplete  Culture, sputum-assessment     Status: None   Collection Time: 08/30/18  5:30 PM  Result Value Ref Range Status   Specimen Description SPUTUM expect  Final   Special Requests NONE  Final   Sputum evaluation    Final    THIS SPECIMEN IS ACCEPTABLE FOR SPUTUM CULTURE Performed at [facility], [address], [place], [place] [address]    Report Status 08/30/2018 FINAL  Final  Culture, respiratory     Status: None   Collection Time: 08/30/18  5:30 PM  Result Value Ref Range Status   Specimen Description   Final    SPUTUM expect Performed at [facility], [address] [place], [place] [address]    Special Requests   Final    NONE Reflexed from [id number] Performed at [facility], [place], [place] [address]    Gram Stain   Final    RARE WBC PRESENT,BOTH PMN AND MONONUCLEAR RARE GRAM POSITIVE COCCI IN PAIRS RARE GRAM POSITIVE RODS RARE GRAM NEGATIVE COCCOBACILLI    Culture   Final    FEW Consistent with normal respiratory flora. Performed at [facility], [place] [address] [place],  [address]    Report Status 09/02/2018 FINAL  Final    RADIOLOGY:  No results found.   Management plans discussed with the patient, family and they are in agreement.  CODE STATUS:     Code Status Orders  (From admission, onward)         Start     Ordered   08/30/18 1046  Full code  Continuous     08/30/18 1045        Code Status History    Date Active Date Inactive Code Status Order ID Comments User Context   08/22/2018 1435 08/25/2018 2257 Full Code [id number]  Fritzi Mandes, MD Inpatient   01/08/2018 1523 01/11/2018 1712 Full Code [id number]  Nicholes Mango, MD ED   11/18/2017 2227 11/23/2017 2033 Full Code [id number]  Vaughan Basta, MD Inpatient   10/21/2016 1216 10/26/2016 2247 Full Code [id number]  Bettey Costa, MD Inpatient      TOTAL TIME TAKING CARE OF THIS PATIENT: 40 minutes.    Fritzi Mandes M.D on 09/02/2018 at 9:59 AM  Between 7am to 6pm - Pager - [phone] After 6pm go to [email or web address] - password [email or web address] [place]  Office  [phone]  CC: Primary care physician; [employer], [employer]

## 2018-09-02 NOTE — Care Management Note (Signed)
Case Management Note  Patient Details  Name: Darius Taylor MRN: [id number] Date of Birth: 12-Jun-1946  Subjective/Objective:     Patient is from home alone.  Admitted with acute on chronic respiratory failure.  Current with PCP at [facility].  Open to [facility] for home health.  Corene Cornea aware of patient discharge today.  His daughter or a church friend will transport to home today.  He has home oxygen for HS.  [facility] is coming to home today to switch out his larger portable tank for a smaller portable tank.  He does not use a walker or a cane.  Denies difficulties obtaining medications or with medical care.  No further needs identified at this time by [facility].             Action/Plan:   Expected Discharge Date:  09/01/18               Expected Discharge Plan:  [place]  In-House Referral:     Discharge planning Services  CM Consult  Post Acute Care Choice:  Resumption of Svcs/PTA Provider Choice offered to:     DME Arranged:    DME Agency:     HH Arranged:    [place] Agency:  [facility]  Status of Service:  Completed, signed off  If discussed at [place] of Stay Meetings, dates discussed:    Additional Comments:  Elza Rafter, RN 09/02/2018, 9:42 AM

## 2018-09-02 NOTE — Progress Notes (Signed)
Patient experiencing bloody sputum and nasal mucosa . Dark red blood noted. MD Duane Boston notified, per MD hold bedtime eliquis. Will continue to monitor.   Darius Taylor

## 2018-09-02 NOTE — Plan of Care (Signed)
Nutrition Education Note  RD consulted for nutrition education regarding CHF.  72 y.o. male with a known history of congestive heart failure diastolic, chronic a fib on anticoagulation, CAD who was recently admitted and discharged for COPD exacerbation comes to the emergency room with increasing weakness possible fever with chills cough with congestion and productive phlegm.  RD provided "Low Sodium Nutrition Therapy" handout from the Academy of Nutrition and Dietetics. Reviewed patient's dietary recall. Provided examples on ways to decrease sodium intake in diet. Discouraged intake of processed foods and use of salt shaker. Encouraged fresh fruits and vegetables as well as whole grain sources of carbohydrates to maximize fiber intake.   RD discussed why it is important for patient to adhere to diet recommendations, and emphasized the role of fluids, foods to avoid, and importance of weighing self daily. Teach back method used.  Expect fair to poor compliance.  Body mass index is 29.9 kg/m. Pt meets criteria for normal weight for age based on current BMI.  Current diet order is HH, patient is consuming approximately 90% of meals at this time. Labs and medications reviewed. No further nutrition interventions warranted at this time. RD contact information provided. If additional nutrition issues arise, please re-consult RD.   Koleen Distance MS, RD, LDN Pager #- [phone] Office#- [phone] After Hours Pager: [phone]

## 2018-09-02 NOTE — Discharge Instructions (Signed)
Use your oxygen as before °

## 2018-09-02 NOTE — Progress Notes (Signed)
[facility] at [place] NAME: Kelly Eisler    MR#:  [id number]  DATE OF BIRTH:  October 05, 1945  SUBJECTIVE:   Out in the chair. Feels great! Wants to go home REVIEW OF SYSTEMS:   Review of Systems  Constitutional: Negative for chills, fever and weight loss.  HENT: Negative for ear discharge, ear pain and nosebleeds.   Eyes: Negative for blurred vision, pain and discharge.  Respiratory: Positive for cough and hemoptysis. Negative for sputum production, shortness of breath, wheezing and stridor.   Cardiovascular: Negative for chest pain, palpitations, orthopnea and PND.  Gastrointestinal: Negative for abdominal pain, diarrhea, nausea and vomiting.  Genitourinary: Negative for frequency and urgency.  Musculoskeletal: Negative for back pain and joint pain.  Neurological: Negative for sensory change, speech change, focal weakness and weakness.  Psychiatric/Behavioral: Negative for depression and hallucinations. The patient is not nervous/anxious.    Tolerating Diet:yes Tolerating PT: no PT at home  DRUG ALLERGIES:  No Known Allergies  VITALS:  Blood pressure 138/64, pulse 68, temperature 98.6 F (37 C), temperature source Oral, resp. rate 20, height 5\' 10"  (1.778 m), weight 94.5 kg, SpO2 92 %.  PHYSICAL EXAMINATION:   Physical Exam  GENERAL:  72 y.o.-year-old patient lying in the bed with no acute distress.  EYES: Pupils equal, round, reactive to light and accommodation. No scleral icterus. Extraocular muscles intact.  HEENT: Head atraumatic, normocephalic. Oropharynx and nasopharynx clear.  NECK:  Supple, no jugular venous distention. No thyroid enlargement, no tenderness.  LUNGS: Normal breath sounds bilaterally, no wheezing, rales, rhonchi. No use of accessory muscles of respiration.  CARDIOVASCULAR: S1, S2 normal. No murmurs, rubs, or gallops.  ABDOMEN: Soft, nontender, nondistended. Bowel sounds present. No organomegaly or mass.   EXTREMITIES: No cyanosis, clubbing or edema b/l.    NEUROLOGIC: Cranial nerves II through XII are intact. No focal Motor or sensory deficits b/l.   PSYCHIATRIC:  patient is alert and oriented x 3.  SKIN: No obvious rash, lesion, or ulcer.   LABORATORY PANEL:  CBC Recent Labs  Lab 09/01/18 0414  WBC 16.9*  HGB 10.8*  HCT 34.4*  PLT 189    Chemistries  Recent Labs  Lab 08/30/18 0830  NA 136  K 4.3  CL 98  CO2 28  GLUCOSE 132*  BUN 42*  CREATININE 1.72*  CALCIUM 8.4*  AST 33  ALT 46*  ALKPHOS 77  BILITOT 1.5*   Cardiac Enzymes Recent Labs  Lab 08/30/18 2231  TROPONINI 0.14*   RADIOLOGY:  No results found. ASSESSMENT AND PLAN:   Javed Cotto  is a 72 y.o. male with a known history of congestive heart failure diastolic, chronic a fib on anticoagulation, CAD who was recently admitted and discharged for COPD exacerbation comes to the emergency room with increasing weakness possible fever with chills cough with congestion and productive phlegm.  1. acute on chronic hypoxic respiratory failure secondary to pneumonia in the setting of congestive heart failure acute on chronic diastolic -IV Lasix 20 mg BID--diuresed well. Change to oral lasix home dose -IV Cefepime--changed to po levaquin -continue oxygen, PRN nebs, inhalers -patient currently not wheezing I will hold off on steroids -he already got a dose of IV Solu-Medrol and the emergency room  2. Pneumonia -treatment as above -Pro calcitonin 8.34  3. Chronic atrial fibrillation -continue cardiac meds and oral anticoagulation with eliquis -very mild hemoptysis--cont to monitor hgb stable at 10.6. Fading out. Pt advised to continue monitoring and f/u  PCP  4. 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 Patient apparently had some issue getting additional oxygen at home last week  5. Hypertension -blood pressure on the softer side -will hold some of the blood pressure  meds resume when appropriate  6. DVT prophylaxis on eliquis  Pt overall improved. D/c home with oxygen  Case discussed with Care Management/Social Worker. Management plans discussed with the patient, family and they are in agreement.  CODE STATUS: full  DVT Prophylaxis: eleiquis  TOTAL TIME TAKING CARE OF THIS PATIENT: *30* minutes.  >50% time spent on counselling and coordination of care  POSSIBLE D/C IN *30* DAYS, DEPENDING ON CLINICAL CONDITION.  Note: This dictation was prepared with Dragon dictation along with smaller phrase technology. Any transcriptional errors that result from this process are unintentional.  Fritzi Mandes M.D on 09/02/2018 at 9:52 AM  Between 7am to 6pm - Pager - [phone]  After 6pm go to [email or web address] - password [email or web address] [place] [facility]  Office  [phone]  CC: Primary care physician; Inc, [place] ServicesPatient ID: DRESHON PROFFIT, male   DOB: [birth year], 72 y.o.   MRN: [id number]

## 2018-09-04 LAB — CULTURE, BLOOD (ROUTINE X 2)
CULTURE: NO GROWTH
Culture: NO GROWTH
Special Requests: ADEQUATE

## 2018-09-09 NOTE — Discharge Summary (Signed)
[facility] [place] NAME: Alik Mawson    MR#:  [id number]  DATE OF BIRTH:  11-21-1945  DATE OF ADMISSION:  08/22/2018 ADMITTING PHYSICIAN: Fritzi Mandes, MD  DATE OF DISCHARGE: 08/25/2018  7:38 PM  PRIMARY CARE PHYSICIAN: [facility], [facility]   ADMISSION DIAGNOSIS:  COPD exacerbation ([place]) [J44.1] Acute respiratory failure with hypoxia ([place]) [J96.01]  DISCHARGE DIAGNOSIS:  Active Problems:   COPD with acute exacerbation ([place])   SECONDARY DIAGNOSIS:   Past Medical History:  Diagnosis Date  . Aortic stenosis   . Asthma   . Atrial fibrillation ([place])   . CAD (coronary artery disease)   . Cancer [facility])    colon, bladder and kidney  . COPD (chronic obstructive pulmonary disease) ([place])   . Hypertension      ADMITTING HISTORY  HISTORY OF PRESENT ILLNESS:  Terri Malerba  is a 72 y.o. male with a known history of COPD, aortic stenosis, CAD comes to the emergency room which shortness of breath and intermittent chest tightness along with dry cough for several days denies any lower extremity swelling denies any fever. It was found to have wheezing. He does not wear home oxygen he does have oxygen at home. He was found to have acute on chronic COPD exacerbation. Received IV Solu-Medrol and nebulizer. Patient currently is doing well and seems to be improving with no wheezing.  X-ray negative for pneumonia.   HOSPITAL COURSE:   *Acute COPD exacerbation with acute hypoxic respiratory failure -IV steroids in hospital. - Scheduled Nebulizers - Inhalers - Still needing O2 at discharge which he has used in the past This was setup at home again by CM Patient is afebrile. Feels back to baseline and discharged home in stable condition  *CAD with chronic elevation of troponin.  Likely demand ischemia.  Stable on repeat troponin check.  *Hypertension.  Continue home medications.  *Paroxysmal atrial fibrillation.  On  anticoagulation.  *DVT prophylaxis.  Patient is on Eliquis.  CONSULTS OBTAINED:    DRUG ALLERGIES:  No Known Allergies  DISCHARGE MEDICATIONS:   Allergies as of 08/25/2018   No Known Allergies     Medication List    TAKE these medications   acetaminophen 325 MG tablet Commonly known as:  TYLENOL Take 650 mg by mouth every 6 (six) hours as needed.   albuterol 108 (90 Base) MCG/ACT inhaler Commonly known as:  PROVENTIL HFA;VENTOLIN HFA Inhale 1-2 puffs into the lungs every 6 (six) hours as needed for wheezing or shortness of breath.   allopurinol 100 MG tablet Commonly known as:  ZYLOPRIM Take 1 tablet (100 mg total) by mouth daily.   amiodarone 200 MG tablet Commonly known as:  PACERONE Take 200 mg by mouth daily.   amLODipine 10 MG tablet Commonly known as:  NORVASC Take 10 mg by mouth daily.   aspirin 81 MG chewable tablet Chew 81 mg by mouth daily.   brimonidine 0.15 % ophthalmic solution Commonly known as:  ALPHAGAN Place 1 drop into both eyes 3 (three) times daily.   budesonide-formoterol 160-4.5 MCG/ACT inhaler Commonly known as:  SYMBICORT Inhale 2 puffs into the lungs 2 (two) times daily.   cholecalciferol 1000 units tablet Commonly known as:  VITAMIN D Take 1,000 Units by mouth daily.   colchicine 0.6 MG tablet Take 1 tablet (0.6 mg total) by mouth 2 (two) times daily.   ELIQUIS 5 MG Tabs tablet Generic drug:  apixaban Take 5 mg by mouth 2 (  two) times daily.   furosemide 40 MG tablet Commonly known as:  LASIX Take 40 mg by mouth 2 (two) times daily.   guaiFENesin 100 MG/5ML Soln Commonly known as:  ROBITUSSIN Take 5 mLs (100 mg total) by mouth every 4 (four) hours as needed for cough or to loosen phlegm.   latanoprost 0.005 % ophthalmic solution Commonly known as:  XALATAN Place 1 drop into both eyes at bedtime.   magnesium oxide 400 MG tablet Commonly known as:  MAG-OX Take 400 mg by mouth 2 (two) times daily.   metoprolol 200 MG  24 hr tablet Commonly known as:  TOPROL-XL Take 200 mg by mouth daily.   nitroGLYCERIN 0.4 MG SL tablet Commonly known as:  NITROSTAT Place 0.4 mg under the tongue every 5 (five) minutes as needed for chest pain.   spironolactone 25 MG tablet Commonly known as:  ALDACTONE Take 12.5 mg by mouth daily.   tiotropium 18 MCG inhalation capsule Commonly known as:  SPIRIVA HANDIHALER Place 1 capsule (18 mcg total) into inhaler and inhale daily.       Today   VITAL SIGNS:  Blood pressure 138/86, pulse 63, temperature 97.7 F (36.5 C), temperature source Axillary, resp. rate 20, height 5\' 10"  (1.778 m), weight 98.4 kg, SpO2 94 %.  I/O:  No intake or output data in the 24 hours ending 09/09/18 2258  PHYSICAL EXAMINATION:  Physical Exam  GENERAL:  72 y.o.-year-old patient lying in the bed with no acute distress.  LUNGS: Normal breath sounds bilaterally, no wheezing, rales,rhonchi or crepitation. No use of accessory muscles of respiration.  CARDIOVASCULAR: S1, S2 normal. No murmurs, rubs, or gallops.  ABDOMEN: Soft, non-tender, non-distended. Bowel sounds present. No organomegaly or mass.  NEUROLOGIC: Moves all 4 extremities. PSYCHIATRIC: The patient is alert and oriented x 3.  SKIN: No obvious rash, lesion, or ulcer.   DATA REVIEW:   CBC No results for input(s): WBC, HGB, HCT, PLT in the last 168 hours.  Chemistries  No results for input(s): NA, K, CL, CO2, GLUCOSE, BUN, CREATININE, CALCIUM, MG, AST, ALT, ALKPHOS, BILITOT in the last 168 hours.  Invalid input(s): GFRCGP  Cardiac Enzymes No results for input(s): TROPONINI in the last 168 hours.  Microbiology Results  Results for orders placed or performed during the hospital encounter of 08/22/18  MRSA PCR Screening     Status: None   Collection Time: 08/22/18  2:56 PM  Result Value Ref Range Status   MRSA by PCR NEGATIVE NEGATIVE Final    Comment:        The GeneXpert MRSA Assay (FDA approved for NASAL  specimens only), is one component of a comprehensive MRSA colonization surveillance program. It is not intended to diagnose MRSA infection nor to guide or monitor treatment for MRSA infections. Performed at [facility], [address], [place], [place] [address]     RADIOLOGY:  No results found.  Follow up with PCP in 1 week.  Management plans discussed with the patient, family and they are in agreement.  CODE STATUS:  Code Status History    Date Active Date Inactive Code Status Order ID Comments User Context   08/30/2018 1045 09/02/2018 1616 Full Code [id number]  Fritzi Mandes, MD Inpatient   08/22/2018 1435 08/25/2018 2257 Full Code [id number]  Fritzi Mandes, MD Inpatient   01/08/2018 1523 01/11/2018 1712 Full Code [id number]  Nicholes Mango, MD ED   11/18/2017 2227 11/23/2017 2033 Full Code [id number]  Vaughan Basta, MD Inpatient  10/21/2016 1216 10/26/2016 2247 Full Code [id number]  Bettey Costa, MD Inpatient      TOTAL TIME TAKING CARE OF THIS PATIENT ON DAY OF DISCHARGE: more than 30 minutes.   [name] Fonnie Crookshanks M.D on 09/09/2018 at 10:58 PM  Between 7am to 6pm - Pager - [phone]  After 6pm go to [email or web address] - password [email or web address] [facility]  Office  [phone]  CC: Primary care physician; [facility] [employer]  Note: This dictation was prepared with Diplomatic Services operational officer dictation along with smaller Company secretary. Any transcriptional errors that result from this process are unintentional.

## 2018-09-22 ENCOUNTER — Ambulatory Visit: Payer: Medicare HMO | Attending: Family | Admitting: Family

## 2018-09-22 ENCOUNTER — Encounter: Payer: Self-pay | Admitting: Family

## 2018-09-22 DIAGNOSIS — Z79899 Other long term (current) drug therapy: Secondary | ICD-10-CM | POA: Diagnosis not present

## 2018-09-22 DIAGNOSIS — I350 Nonrheumatic aortic (valve) stenosis: Secondary | ICD-10-CM | POA: Insufficient documentation

## 2018-09-22 DIAGNOSIS — Z87891 Personal history of nicotine dependence: Secondary | ICD-10-CM | POA: Diagnosis not present

## 2018-09-22 DIAGNOSIS — Z95810 Presence of automatic (implantable) cardiac defibrillator: Secondary | ICD-10-CM | POA: Diagnosis not present

## 2018-09-22 DIAGNOSIS — Z7901 Long term (current) use of anticoagulants: Secondary | ICD-10-CM | POA: Insufficient documentation

## 2018-09-22 DIAGNOSIS — Z8249 Family history of ischemic heart disease and other diseases of the circulatory system: Secondary | ICD-10-CM | POA: Diagnosis not present

## 2018-09-22 DIAGNOSIS — Z7982 Long term (current) use of aspirin: Secondary | ICD-10-CM | POA: Insufficient documentation

## 2018-09-22 DIAGNOSIS — R0602 Shortness of breath: Secondary | ICD-10-CM | POA: Diagnosis present

## 2018-09-22 DIAGNOSIS — I5032 Chronic diastolic (congestive) heart failure: Secondary | ICD-10-CM | POA: Insufficient documentation

## 2018-09-22 DIAGNOSIS — I2510 Atherosclerotic heart disease of native coronary artery without angina pectoris: Secondary | ICD-10-CM | POA: Insufficient documentation

## 2018-09-22 DIAGNOSIS — I4891 Unspecified atrial fibrillation: Secondary | ICD-10-CM | POA: Diagnosis not present

## 2018-09-22 DIAGNOSIS — J449 Chronic obstructive pulmonary disease, unspecified: Secondary | ICD-10-CM | POA: Diagnosis not present

## 2018-09-22 DIAGNOSIS — Z85528 Personal history of other malignant neoplasm of kidney: Secondary | ICD-10-CM | POA: Diagnosis not present

## 2018-09-22 DIAGNOSIS — I110 Hypertensive heart disease with heart failure: Secondary | ICD-10-CM | POA: Diagnosis not present

## 2018-09-22 DIAGNOSIS — I10 Essential (primary) hypertension: Secondary | ICD-10-CM

## 2018-09-22 DIAGNOSIS — Z7951 Long term (current) use of inhaled steroids: Secondary | ICD-10-CM | POA: Diagnosis not present

## 2018-09-22 NOTE — Progress Notes (Signed)
Patient ID: Darius Taylor, male    DOB: 03-Nov-1945, 72 y.o.   MRN: [id number]  HPI  [name] is a 72 y/o male with a history of CAD, COPD, HTN, asthma, colon/ renal cancer, atrial fibrillation, previous tobacco use and chronic heart failure.   Echo report from 03/29/17 reviewed and showed an EF of 55-60% along with an elevated PA pressure of 71 mmHg.  Admitted 08/30/18 due to pneumonia along with HF exacerbation. Initially needed IV lasix and then transitioned to oral diuretics. Given antibiotics due to pneumonia. Discharged after 3 days. Admitted 08/22/18 due to COPD exacerbation. Given IV steroids initially along with oxygen. Elevated troponin thought to be due to demand ischemia. Discharged after 3 days.   He presents today for his initial visit with a chief complaint of minimal shortness of breath upon moderate exertion. He describes this as chronic in nature having been present for several years. He has associated fatigue and light-headedness along with this. He denies any difficulty sleeping, abdominal distention, palpitations, pedal edema, chest pain or weight gain.   Past Medical History:  Diagnosis Date  . Aortic stenosis   . Arrhythmia    atrial fibrillation  . Asthma   . Atrial fibrillation ([place])   . CAD (coronary artery disease)   . Cancer [facility])    colon, bladder and kidney  . CHF (congestive heart failure) ([place])   . COPD (chronic obstructive pulmonary disease) ([place])   . Hypertension    Past Surgical History:  Procedure Laterality Date  . CARDIAC DEFIBRILLATOR PLACEMENT    . COLONOSCOPY WITH PROPOFOL N/A 06/10/2018   Procedure: COLONOSCOPY WITH PROPOFOL;  Surgeon: Lin Landsman, MD;  Location: [facility] ENDOSCOPY;  Service: Gastroenterology;  Laterality: N/A;  . CORONARY ANGIOPLASTY  09/25/2016  . IRRIGATION AND DEBRIDEMENT HEMATOMA Left 10/24/2016   Procedure: IRRIGATION AND DEBRIDEMENT HEMATOMA;  Surgeon: Florene Glen, MD;  Location: [facility] ORS;  Service: General;   Laterality: Left;  . KIDNEY SURGERY     left removed  . PACEMAKER IMPLANT     Family History  Problem Relation Age of Onset  . Hypertension Mother    Social History   Tobacco Use  . Smoking status: Former Research scientist (life sciences)  . Smokeless tobacco: Never Used  Substance Use Topics  . Alcohol use: No   No Known Allergies Prior to Admission medications   Medication Sig Start Date End Date Taking? Authorizing Provider  acetaminophen (TYLENOL) 325 MG tablet Take 650 mg by mouth every 6 (six) hours as needed.    Yes [provider]  albuterol (PROVENTIL HFA;VENTOLIN HFA) 108 (90 Base) MCG/ACT inhaler Inhale 1-2 puffs into the lungs every 6 (six) hours as needed for wheezing or shortness of breath.   Yes [provider]  allopurinol (ZYLOPRIM) 100 MG tablet Take 1 tablet (100 mg total) by mouth daily. 01/11/18 01/11/19 Yes Dustin Flock, MD  amiodarone (PACERONE) 200 MG tablet Take 200 mg by mouth daily.   Yes [provider]  amLODipine (NORVASC) 10 MG tablet Take 10 mg by mouth daily.   Yes [provider]  apixaban (ELIQUIS) 5 MG TABS tablet Take 5 mg by mouth 2 (two) times daily.   Yes [provider]  aspirin 81 MG chewable tablet Chew 81 mg by mouth daily.   Yes [provider]  brimonidine (ALPHAGAN) 0.15 % ophthalmic solution Place 1 drop into both eyes 3 (three) times daily. 12/20/17  Yes [provider]  budesonide-formoterol (SYMBICORT) 160-4.5 MCG/ACT inhaler  Inhale 2 puffs into the lungs 2 (two) times daily.   Yes [provider]  cholecalciferol (VITAMIN D) 1000 units tablet Take 1,000 Units by mouth daily.   Yes [provider]  colchicine 0.6 MG tablet Take 1 tablet (0.6 mg total) by mouth 2 (two) times daily. 01/11/18 01/11/19 Yes Dustin Flock, MD  furosemide (LASIX) 40 MG tablet Take 40 mg by mouth 2 (two) times daily.    Yes [provider]  guaiFENesin (ROBITUSSIN) 100 MG/5ML SOLN Take 5 mLs (100  mg total) by mouth every 4 (four) hours as needed for cough or to loosen phlegm. 10/26/16  Yes Sudini, Alveta Heimlich, MD  latanoprost (XALATAN) 0.005 % ophthalmic solution Place 1 drop into both eyes at bedtime. 12/20/17  Yes [provider]  magnesium oxide (MAG-OX) 400 MG tablet Take 400 mg by mouth 2 (two) times daily.   Yes [provider]  metoprolol (TOPROL-XL) 200 MG 24 hr tablet Take 200 mg by mouth daily.   Yes [provider]  nitroGLYCERIN (NITROSTAT) 0.4 MG SL tablet Place 0.4 mg under the tongue every 5 (five) minutes as needed for chest pain.   Yes [provider]  spironolactone (ALDACTONE) 25 MG tablet Take 12.5 mg by mouth daily.   Yes [provider]  tiotropium (SPIRIVA HANDIHALER) 18 MCG inhalation capsule Place 1 capsule (18 mcg total) into inhaler and inhale daily. 08/25/18 09/24/18 Yes [name], MD    Review of Systems  Constitutional: Positive for fatigue ("little bit"). Negative for appetite change.  HENT: Negative for congestion, postnasal drip and sore throat.   Eyes: Negative.   Respiratory: Positive for shortness of breath (with moderate exertion). Negative for cough.   Cardiovascular: Negative for chest pain, palpitations and leg swelling.  Gastrointestinal: Negative for abdominal distention and abdominal pain.  Endocrine: Negative.   Genitourinary: Negative.   Musculoskeletal: Negative for back pain and neck pain.  Skin: Negative.   Allergic/Immunologic: Negative.   Neurological: Positive for light-headedness (with position changes). Negative for dizziness.  Hematological: Negative for adenopathy. Does not bruise/bleed easily.  Psychiatric/Behavioral: Negative for dysphoric mood and sleep disturbance (wears oxygen PRN at 2L). The patient is not nervous/anxious.    Vitals:   09/22/18 1533  BP: (!) 161/66  Pulse: 71  Resp: 18  SpO2: 91%  Weight: 215 lb 2 oz (97.6 kg)  Height: 5\' 11"  (1.803 m)   Wt Readings from Last  3 Encounters:  09/22/18 215 lb 2 oz (97.6 kg)  09/02/18 208 lb 6.4 oz (94.5 kg)  08/22/18 217 lb (98.4 kg)   Lab Results  Component Value Date   CREATININE 1.72 (H) 08/30/2018   CREATININE 1.48 (H) 08/22/2018   CREATININE 1.72 (H) 01/09/2018   Physical Exam Vitals signs and nursing note reviewed.  Constitutional:      Appearance: He is well-developed.  HENT:     Head: Normocephalic and atraumatic.  Neck:     Musculoskeletal: Neck supple.     Vascular: No JVD.  Cardiovascular:     Rate and Rhythm: Normal rate and regular rhythm.  Pulmonary:     Effort: Pulmonary effort is normal. No respiratory distress.     Breath sounds: Normal breath sounds. No wheezing or rales.  Abdominal:     Palpations: Abdomen is soft.     Tenderness: There is no abdominal tenderness.  Musculoskeletal:     Right lower leg: He exhibits no tenderness. No edema.     Left lower leg: He exhibits  no tenderness. No edema.  Skin:    General: Skin is warm and dry.  Neurological:     General: No focal deficit present.     Mental Status: He is alert and oriented to person, place, and time.  Psychiatric:        Mood and Affect: Mood normal.        Behavior: Behavior normal.    Assessment & Plan:  1: Chronic heart failure with preserved ejection fraction- - NYHA class II - euvolemic today - weighing daily; instructed to call for an overnight weight gain of >2 pounds or a weekly weight gain of >5 pounds - not adding salt to his food; reviewed the importance of closely following a 2000mg  sodium diet and written dietary information was given to him about this.  - follows with cardiology Darius Taylor) - reports receiving his flu vaccine for this season - BNP 08/30/18 was 310.0  2: HTN-  - BP elevated today but he says that he was nervous about coming today - follows with PCP at [place] 08/30/18 reviewed and showed sodium 136, potassium 4.3, creatinine 1.72 and GFR 38  Medication list was  reviewed.  Return in 2 months or sooner for any questions/problems before then.

## 2018-09-22 NOTE — Patient Instructions (Signed)
Continue weighing daily and call for an overnight weight gain of > 2 pounds or a weekly weight gain of >5 pounds. 

## 2018-09-24 ENCOUNTER — Encounter: Payer: Self-pay | Admitting: Family

## 2018-09-24 DIAGNOSIS — I10 Essential (primary) hypertension: Secondary | ICD-10-CM | POA: Insufficient documentation

## 2018-09-24 DIAGNOSIS — I5032 Chronic diastolic (congestive) heart failure: Secondary | ICD-10-CM | POA: Insufficient documentation

## 2018-11-03 ENCOUNTER — Inpatient Hospital Stay
Admission: EM | Admit: 2018-11-03 | Discharge: 2018-11-12 | DRG: 193 | Disposition: A | Discharge status: Home Health | Payer: Medicare HMO | Attending: Internal Medicine | Admitting: Internal Medicine

## 2018-11-03 ENCOUNTER — Other Ambulatory Visit: Payer: Self-pay

## 2018-11-03 ENCOUNTER — Emergency Department: Payer: Medicare HMO

## 2018-11-03 DIAGNOSIS — Y95 Nosocomial condition: Secondary | ICD-10-CM | POA: Diagnosis present

## 2018-11-03 DIAGNOSIS — Z952 Presence of prosthetic heart valve: Secondary | ICD-10-CM

## 2018-11-03 DIAGNOSIS — N179 Acute kidney failure, unspecified: Secondary | ICD-10-CM | POA: Diagnosis not present

## 2018-11-03 DIAGNOSIS — H409 Unspecified glaucoma: Secondary | ICD-10-CM | POA: Diagnosis present

## 2018-11-03 DIAGNOSIS — Y92230 Patient room in hospital as the place of occurrence of the external cause: Secondary | ICD-10-CM | POA: Diagnosis not present

## 2018-11-03 DIAGNOSIS — J189 Pneumonia, unspecified organism: Principal | ICD-10-CM | POA: Diagnosis present

## 2018-11-03 DIAGNOSIS — Z22322 Carrier or suspected carrier of Methicillin resistant Staphylococcus aureus: Secondary | ICD-10-CM

## 2018-11-03 DIAGNOSIS — Z9861 Coronary angioplasty status: Secondary | ICD-10-CM | POA: Diagnosis not present

## 2018-11-03 DIAGNOSIS — I447 Left bundle-branch block, unspecified: Secondary | ICD-10-CM | POA: Diagnosis present

## 2018-11-03 DIAGNOSIS — I440 Atrioventricular block, first degree: Secondary | ICD-10-CM | POA: Diagnosis present

## 2018-11-03 DIAGNOSIS — R490 Dysphonia: Secondary | ICD-10-CM | POA: Diagnosis present

## 2018-11-03 DIAGNOSIS — R739 Hyperglycemia, unspecified: Secondary | ICD-10-CM | POA: Diagnosis not present

## 2018-11-03 DIAGNOSIS — T380X5A Adverse effect of glucocorticoids and synthetic analogues, initial encounter: Secondary | ICD-10-CM | POA: Diagnosis not present

## 2018-11-03 DIAGNOSIS — Z95810 Presence of automatic (implantable) cardiac defibrillator: Secondary | ICD-10-CM | POA: Diagnosis not present

## 2018-11-03 DIAGNOSIS — Z87891 Personal history of nicotine dependence: Secondary | ICD-10-CM

## 2018-11-03 DIAGNOSIS — J440 Chronic obstructive pulmonary disease with acute lower respiratory infection: Secondary | ICD-10-CM | POA: Diagnosis present

## 2018-11-03 DIAGNOSIS — Z8249 Family history of ischemic heart disease and other diseases of the circulatory system: Secondary | ICD-10-CM

## 2018-11-03 DIAGNOSIS — I130 Hypertensive heart and chronic kidney disease with heart failure and stage 1 through stage 4 chronic kidney disease, or unspecified chronic kidney disease: Secondary | ICD-10-CM | POA: Diagnosis present

## 2018-11-03 DIAGNOSIS — Z9981 Dependence on supplemental oxygen: Secondary | ICD-10-CM | POA: Diagnosis not present

## 2018-11-03 DIAGNOSIS — Z86718 Personal history of other venous thrombosis and embolism: Secondary | ICD-10-CM

## 2018-11-03 DIAGNOSIS — J441 Chronic obstructive pulmonary disease with (acute) exacerbation: Secondary | ICD-10-CM | POA: Diagnosis present

## 2018-11-03 DIAGNOSIS — I361 Nonrheumatic tricuspid (valve) insufficiency: Secondary | ICD-10-CM | POA: Diagnosis not present

## 2018-11-03 DIAGNOSIS — Z85038 Personal history of other malignant neoplasm of large intestine: Secondary | ICD-10-CM

## 2018-11-03 DIAGNOSIS — Z5320 Procedure and treatment not carried out because of patient's decision for unspecified reasons: Secondary | ICD-10-CM | POA: Diagnosis present

## 2018-11-03 DIAGNOSIS — R0602 Shortness of breath: Secondary | ICD-10-CM | POA: Diagnosis present

## 2018-11-03 DIAGNOSIS — I482 Chronic atrial fibrillation, unspecified: Secondary | ICD-10-CM | POA: Diagnosis present

## 2018-11-03 DIAGNOSIS — J387 Other diseases of larynx: Secondary | ICD-10-CM | POA: Diagnosis present

## 2018-11-03 DIAGNOSIS — Z7982 Long term (current) use of aspirin: Secondary | ICD-10-CM

## 2018-11-03 DIAGNOSIS — J9621 Acute and chronic respiratory failure with hypoxia: Secondary | ICD-10-CM | POA: Diagnosis present

## 2018-11-03 DIAGNOSIS — J9601 Acute respiratory failure with hypoxia: Secondary | ICD-10-CM

## 2018-11-03 DIAGNOSIS — Z905 Acquired absence of kidney: Secondary | ICD-10-CM

## 2018-11-03 DIAGNOSIS — I428 Other cardiomyopathies: Secondary | ICD-10-CM | POA: Diagnosis present

## 2018-11-03 DIAGNOSIS — I2510 Atherosclerotic heart disease of native coronary artery without angina pectoris: Secondary | ICD-10-CM | POA: Diagnosis present

## 2018-11-03 DIAGNOSIS — I340 Nonrheumatic mitral (valve) insufficiency: Secondary | ICD-10-CM | POA: Diagnosis not present

## 2018-11-03 DIAGNOSIS — Z79899 Other long term (current) drug therapy: Secondary | ICD-10-CM

## 2018-11-03 DIAGNOSIS — Z7901 Long term (current) use of anticoagulants: Secondary | ICD-10-CM

## 2018-11-03 DIAGNOSIS — I5043 Acute on chronic combined systolic (congestive) and diastolic (congestive) heart failure: Secondary | ICD-10-CM | POA: Diagnosis present

## 2018-11-03 DIAGNOSIS — Z7951 Long term (current) use of inhaled steroids: Secondary | ICD-10-CM

## 2018-11-03 DIAGNOSIS — N183 Chronic kidney disease, stage 3 (moderate): Secondary | ICD-10-CM | POA: Diagnosis present

## 2018-11-03 DIAGNOSIS — Z823 Family history of stroke: Secondary | ICD-10-CM

## 2018-11-03 DIAGNOSIS — Z85528 Personal history of other malignant neoplasm of kidney: Secondary | ICD-10-CM

## 2018-11-03 DIAGNOSIS — I509 Heart failure, unspecified: Secondary | ICD-10-CM

## 2018-11-03 DIAGNOSIS — Z9049 Acquired absence of other specified parts of digestive tract: Secondary | ICD-10-CM

## 2018-11-03 HISTORY — DX: Malignant neoplasm of colon, unspecified: C18.9

## 2018-11-03 HISTORY — DX: Personal history of other diseases of the circulatory system: Z86.79

## 2018-11-03 HISTORY — DX: Other cardiomyopathies: I42.8

## 2018-11-03 HISTORY — DX: Malignant neoplasm of unspecified kidney, except renal pelvis: C64.9

## 2018-11-03 LAB — CBC WITH DIFFERENTIAL/PLATELET
Abs Immature Granulocytes: 0.05 10*3/uL (ref 0.00–0.07)
Basophils Absolute: 0 10*3/uL (ref 0.0–0.1)
Basophils Relative: 0 %
Eosinophils Absolute: 0 10*3/uL (ref 0.0–0.5)
Eosinophils Relative: 1 %
HCT: 36.3 % — ABNORMAL LOW (ref 39.0–52.0)
HEMOGLOBIN: 11.3 g/dL — AB (ref 13.0–17.0)
Immature Granulocytes: 1 %
LYMPHS PCT: 14 %
Lymphs Abs: 1.2 10*3/uL (ref 0.7–4.0)
MCH: 30.3 pg (ref 26.0–34.0)
MCHC: 31.1 g/dL (ref 30.0–36.0)
MCV: 97.3 fL (ref 80.0–100.0)
Monocytes Absolute: 1.1 10*3/uL — ABNORMAL HIGH (ref 0.1–1.0)
Monocytes Relative: 13 %
Neutro Abs: 5.9 10*3/uL (ref 1.7–7.7)
Neutrophils Relative %: 71 %
Platelets: 209 10*3/uL (ref 150–400)
RBC: 3.73 MIL/uL — ABNORMAL LOW (ref 4.22–5.81)
RDW: 15 % (ref 11.5–15.5)
WBC: 8.2 10*3/uL (ref 4.0–10.5)
nRBC: 0 % (ref 0.0–0.2)

## 2018-11-03 LAB — BRAIN NATRIURETIC PEPTIDE: B Natriuretic Peptide: 428 pg/mL — ABNORMAL HIGH (ref 0.0–100.0)

## 2018-11-03 LAB — BASIC METABOLIC PANEL
Anion gap: 7 (ref 5–15)
BUN: 33 mg/dL — ABNORMAL HIGH (ref 8–23)
CO2: 31 mmol/L (ref 22–32)
CREATININE: 1.8 mg/dL — AB (ref 0.61–1.24)
Calcium: 8.8 mg/dL — ABNORMAL LOW (ref 8.9–10.3)
Chloride: 98 mmol/L (ref 98–111)
GFR calc non Af Amer: 36 mL/min — ABNORMAL LOW (ref >60)
GFR, EST AFRICAN AMERICAN: 42 mL/min — AB (ref >60)
GLUCOSE: 107 mg/dL — AB (ref 70–99)
Potassium: 4.2 mmol/L (ref 3.5–5.1)
Sodium: 136 mmol/L (ref 135–145)

## 2018-11-03 LAB — MRSA PCR SCREENING: MRSA by PCR: POSITIVE — AB

## 2018-11-03 LAB — TSH: TSH: 0.874 u[IU]/mL (ref 0.350–4.500)

## 2018-11-03 LAB — PROCALCITONIN: PROCALCITONIN: 0.2 ng/mL

## 2018-11-03 LAB — TROPONIN I
Troponin I: 0.16 ng/mL (ref <0.03)
Troponin I: 0.17 ng/mL (ref <0.03)

## 2018-11-03 MED ORDER — ALBUTEROL SULFATE (2.5 MG/3ML) 0.083% IN NEBU
INHALATION_SOLUTION | RESPIRATORY_TRACT | Status: AC
  Administered 2018-11-03: 10 mg via RESPIRATORY_TRACT
  Filled 2018-11-03: qty 9

## 2018-11-03 MED ORDER — METHYLPREDNISOLONE SODIUM SUCC 125 MG IJ SOLR
60.0000 mg | INTRAMUSCULAR | Status: DC
  Administered 2018-11-03 – 2018-11-05 (×3): 60 mg via INTRAVENOUS
  Filled 2018-11-03 (×3): qty 2

## 2018-11-03 MED ORDER — VANCOMYCIN HCL 10 G IV SOLR
1250.0000 mg | INTRAVENOUS | Status: DC
  Administered 2018-11-04 – 2018-11-06 (×3): 1250 mg via INTRAVENOUS
  Filled 2018-11-03 (×4): qty 1250

## 2018-11-03 MED ORDER — VANCOMYCIN HCL IN DEXTROSE 1-5 GM/200ML-% IV SOLN
1000.0000 mg | Freq: Once | INTRAVENOUS | Status: AC
  Administered 2018-11-03: 1000 mg via INTRAVENOUS
  Filled 2018-11-03: qty 200

## 2018-11-03 MED ORDER — LATANOPROST 0.005 % OP SOLN
1.0000 [drp] | Freq: Every day | OPHTHALMIC | Status: DC
  Administered 2018-11-03 – 2018-11-11 (×6): 1 [drp] via OPHTHALMIC
  Filled 2018-11-03 (×4): qty 2.5

## 2018-11-03 MED ORDER — NITROGLYCERIN 0.4 MG SL SUBL: 0.4000 mg | SUBLINGUAL_TABLET | SUBLINGUAL | Status: DC | PRN

## 2018-11-03 MED ORDER — ACETAMINOPHEN 650 MG RE SUPP: 650.0000 mg | Freq: Four times a day (QID) | RECTAL | Status: DC | PRN

## 2018-11-03 MED ORDER — ALBUTEROL SULFATE (2.5 MG/3ML) 0.083% IN NEBU
10.0000 mg | INHALATION_SOLUTION | Freq: Once | RESPIRATORY_TRACT | Status: AC
  Administered 2018-11-03: 10 mg via RESPIRATORY_TRACT

## 2018-11-03 MED ORDER — SODIUM CHLORIDE 0.9 % IV SOLN
INTRAVENOUS | Status: DC | PRN
  Administered 2018-11-03 – 2018-11-09 (×7): 500 mL via INTRAVENOUS

## 2018-11-03 MED ORDER — SODIUM CHLORIDE 0.9 % IV SOLN
1.0000 g | Freq: Once | INTRAVENOUS | Status: AC
  Administered 2018-11-03: 1 g via INTRAVENOUS
  Filled 2018-11-03: qty 1

## 2018-11-03 MED ORDER — MAGNESIUM OXIDE 400 (241.3 MG) MG PO TABS
400.0000 mg | ORAL_TABLET | Freq: Two times a day (BID) | ORAL | Status: DC
  Administered 2018-11-03 – 2018-11-12 (×18): 400 mg via ORAL
  Filled 2018-11-03 (×18): qty 1

## 2018-11-03 MED ORDER — MUPIROCIN 2 % EX OINT
1.0000 "application " | TOPICAL_OINTMENT | Freq: Two times a day (BID) | CUTANEOUS | Status: AC
  Administered 2018-11-04 – 2018-11-08 (×9): 1 via NASAL
  Filled 2018-11-03 (×2): qty 22

## 2018-11-03 MED ORDER — METOPROLOL SUCCINATE ER 100 MG PO TB24
200.0000 mg | ORAL_TABLET | Freq: Every day | ORAL | Status: DC
  Administered 2018-11-04 – 2018-11-12 (×9): 200 mg via ORAL
  Filled 2018-11-03 (×9): qty 2

## 2018-11-03 MED ORDER — IPRATROPIUM-ALBUTEROL 0.5-2.5 (3) MG/3ML IN SOLN
3.0000 mL | RESPIRATORY_TRACT | Status: DC
  Administered 2018-11-03 – 2018-11-06 (×15): 3 mL via RESPIRATORY_TRACT
  Filled 2018-11-03 (×14): qty 3

## 2018-11-03 MED ORDER — ONDANSETRON HCL 4 MG PO TABS: 4.0000 mg | ORAL_TABLET | Freq: Four times a day (QID) | ORAL | Status: DC | PRN

## 2018-11-03 MED ORDER — GUAIFENESIN 100 MG/5ML PO SOLN
5.0000 mL | ORAL | Status: DC | PRN
  Administered 2018-11-03 – 2018-11-06 (×5): 100 mg via ORAL
  Filled 2018-11-03 (×7): qty 5

## 2018-11-03 MED ORDER — ONDANSETRON HCL 4 MG/2ML IJ SOLN: 4.0000 mg | Freq: Four times a day (QID) | INTRAMUSCULAR | Status: DC | PRN

## 2018-11-03 MED ORDER — FUROSEMIDE 10 MG/ML IJ SOLN
40.0000 mg | Freq: Two times a day (BID) | INTRAMUSCULAR | Status: DC
  Administered 2018-11-03 – 2018-11-05 (×4): 40 mg via INTRAVENOUS
  Filled 2018-11-03 (×4): qty 4

## 2018-11-03 MED ORDER — CHLORHEXIDINE GLUCONATE CLOTH 2 % EX PADS
6.0000 | MEDICATED_PAD | Freq: Every day | CUTANEOUS | Status: AC
  Administered 2018-11-04 – 2018-11-05 (×2): 6 via TOPICAL

## 2018-11-03 MED ORDER — BRIMONIDINE TARTRATE 0.15 % OP SOLN
1.0000 [drp] | Freq: Three times a day (TID) | OPHTHALMIC | Status: DC
  Administered 2018-11-03 – 2018-11-12 (×24): 1 [drp] via OPHTHALMIC
  Filled 2018-11-03 (×2): qty 5

## 2018-11-03 MED ORDER — ORAL CARE MOUTH RINSE
15.0000 mL | Freq: Two times a day (BID) | OROMUCOSAL | Status: DC
  Administered 2018-11-04 – 2018-11-11 (×10): 15 mL via OROMUCOSAL

## 2018-11-03 MED ORDER — ACETAMINOPHEN 325 MG PO TABS
650.0000 mg | ORAL_TABLET | Freq: Four times a day (QID) | ORAL | Status: DC | PRN
  Administered 2018-11-08: 650 mg via ORAL
  Filled 2018-11-03: qty 2

## 2018-11-03 MED ORDER — ASPIRIN 81 MG PO CHEW
81.0000 mg | CHEWABLE_TABLET | Freq: Every day | ORAL | Status: DC
  Administered 2018-11-04 – 2018-11-12 (×9): 81 mg via ORAL
  Filled 2018-11-03 (×9): qty 1

## 2018-11-03 MED ORDER — ACETAMINOPHEN 325 MG PO TABS: 650.0000 mg | ORAL_TABLET | Freq: Four times a day (QID) | ORAL | Status: DC | PRN

## 2018-11-03 MED ORDER — AMLODIPINE BESYLATE 10 MG PO TABS
10.0000 mg | ORAL_TABLET | Freq: Every day | ORAL | Status: DC
  Administered 2018-11-04 – 2018-11-12 (×9): 10 mg via ORAL
  Filled 2018-11-03 (×9): qty 1

## 2018-11-03 MED ORDER — SPIRONOLACTONE 25 MG PO TABS
12.5000 mg | ORAL_TABLET | Freq: Every day | ORAL | Status: DC
  Administered 2018-11-04 – 2018-11-06 (×3): 12.5 mg via ORAL
  Filled 2018-11-03: qty 0.5
  Filled 2018-11-03: qty 1
  Filled 2018-11-03: qty 0.5
  Filled 2018-11-03: qty 1
  Filled 2018-11-03: qty 0.5
  Filled 2018-11-03: qty 1

## 2018-11-03 MED ORDER — SODIUM CHLORIDE 0.9 % IV SOLN
2.0000 g | Freq: Two times a day (BID) | INTRAVENOUS | Status: DC
  Administered 2018-11-04 – 2018-11-05 (×3): 2 g via INTRAVENOUS
  Filled 2018-11-03 (×5): qty 2

## 2018-11-03 MED ORDER — APIXABAN 5 MG PO TABS
5.0000 mg | ORAL_TABLET | Freq: Two times a day (BID) | ORAL | Status: DC
  Administered 2018-11-03 – 2018-11-12 (×18): 5 mg via ORAL
  Filled 2018-11-03 (×18): qty 1

## 2018-11-03 MED ORDER — AMIODARONE HCL 200 MG PO TABS
200.0000 mg | ORAL_TABLET | Freq: Every day | ORAL | Status: DC
  Administered 2018-11-04 – 2018-11-12 (×9): 200 mg via ORAL
  Filled 2018-11-03 (×9): qty 1

## 2018-11-03 MED ORDER — VITAMIN D3 25 MCG (1000 UNIT) PO TABS
1000.0000 [IU] | ORAL_TABLET | Freq: Every day | ORAL | Status: DC
  Administered 2018-11-04 – 2018-11-12 (×9): 1000 [IU] via ORAL
  Filled 2018-11-03 (×9): qty 1

## 2018-11-03 NOTE — Progress Notes (Signed)
Pharmacy Antibiotic Note  Darius Taylor is a 73 y.o. male admitted on 11/03/2018 with sepsis.  Pharmacy has been consulted for Cefepime, Vancomycin dosing.  Plan: Cefepime 1 gm IV X 1 given in ED on 2/10 @ 1655.  Will order additional cefepime 1 gm IV X 1 to make starting dose of 2 gm. Cefepime 2 gm IV Q12H ordered to start on 2/11 @ 0500.   Vancomycin 1 gm IV X 1 given in ED on 2/10 @ 1736 Vancomycin 1 gm IV X 1 ordered to be given following initial dose to make total loading dose of 2 gm.   Vancomycin 1250 mg IV Q24H ordered to start 2/11 @ 1800.  CrCl = 38 ml/min Vd = 69.4 ml/min Ke = 0.036 hr-1 T1/2 = 19.1 hrs  Calc AUC = 497  Css max = 31 Css min = 13.7   No peak and trough currently ordered.   Height: 5\' 11"  (180.3 cm) Weight: 212 lb 9.6 oz (96.4 kg) IBW/kg (Calculated) : 75.3  Temp (24hrs), Avg:98.4 F (36.9 C), Min:98.4 F (36.9 C), Max:98.4 F (36.9 C)  Recent Labs  Lab 11/03/18 1611  WBC 8.2  CREATININE 1.80*    Estimated Creatinine Clearance: 42.6 mL/min (A) (by C-G formula based on SCr of 1.8 mg/dL (H)).    No Known Allergies  Antimicrobials this admission:   >>    >>   Dose adjustments this admission:   Microbiology results:  BCx:   UCx:    Sputum:    MRSA PCR:   Thank you for allowing pharmacy to be a part of this patient's care.  Lanise Mergen D 11/03/2018 9:36 PM

## 2018-11-03 NOTE — ED Notes (Signed)
Pt taken off BiPAP for trial on nasal cannula. N/c O2 @ 4lmp. Sat 96%. Pt instructed to call if he feels increase in SOB.

## 2018-11-03 NOTE — ED Notes (Signed)
ED TO INPATIENT HANDOFF REPORT  ED Nurse Name and Phone #: Joelene Millin RN [phone]  Name/Age/Gender Darius Taylor 73 y.o. male Room/Bed: ED05A/ED05A  Code Status   Code Status: Prior  Home/SNF/Other Home Patient oriented to: self, place, time and situation Is this baseline? Yes   Triage Complete: Triage complete   Chief Complaint sob  Triage Note Patient arrived by [facility]. Patient complaint is SOB. Per EMS patient received DuoNeb X3 before arrival to ED. Patient states symptoms started last night and started coughing up some blood.  [facility] arrived patient O2 was 76% on room air and was placed on 4L O2. Patient not complaining of any pain. Patient has history of cancer and kidney diease, COPD, CHF, Afib. Patient states started coughing over weekend.     Allergies No Known Allergies  Level of Care/Admitting Diagnosis ED Disposition    ED Disposition Condition [facility] Area: [place] [[id number]]  Level of Care: Telemetry [5]  Diagnosis: CHF exacerbation [facility]) [[id number]]  Admitting Physician: Gladstone Lighter [[id number]]  Attending Physician: Gladstone Lighter [[id number]]  Estimated length of stay: past midnight tomorrow  Certification:: I certify this patient will need inpatient services for at least 2 midnights  PT Class (Do Not Modify): Inpatient [101]  PT Acc Code (Do Not Modify): Private [1]       Medical/Surgery History Past Medical History:  Diagnosis Date  . Aortic stenosis   . Arrhythmia    atrial fibrillation  . Asthma   . Atrial fibrillation ([place])   . CAD (coronary artery disease)   . Cancer [facility])    colon, bladder and kidney  . CHF (congestive heart failure) ([place])   . 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 COPD (chronic obstructive pulmonary disease) ([place])   . H/O ventricular tachycardia   . Hypertension   . Nonischemic cardiomyopathy ([place])    Status post AICD and pacemaker  . Renal cell carcinoma [facility])    Past  Surgical History:  Procedure Laterality Date  . CARDIAC DEFIBRILLATOR PLACEMENT    . CARDIAC DEFIBRILLATOR PLACEMENT    . COLONOSCOPY WITH PROPOFOL N/A 06/10/2018   Procedure: COLONOSCOPY WITH PROPOFOL;  Surgeon: Lin Landsman, MD;  Location: [facility] ENDOSCOPY;  Service: Gastroenterology;  Laterality: N/A;  . CORONARY ANGIOPLASTY  09/25/2016  . IRRIGATION AND DEBRIDEMENT HEMATOMA Left 10/24/2016   Procedure: IRRIGATION AND DEBRIDEMENT HEMATOMA;  Surgeon: Florene Glen, MD;  Location: [facility] ORS;  Service: General;  Laterality: Left;  . KIDNEY SURGERY     left ne[hrectomy for RCC  . PACEMAKER IMPLANT    . PARTIAL COLECTOMY       IV Location/Drains/Wounds Patient Lines/Drains/Airways Status   Active Line/Drains/Airways    Name:   Placement date:   Placement time:   Site:   Days:   Peripheral IV 11/03/18 Right Forearm   11/03/18    1600    Forearm   less than 1   Peripheral IV 11/03/18 Right Antecubital   11/03/18    1653    Antecubital   less than 1          Intake/Output Last 24 hours  Intake/Output Summary (Last 24 hours) at 11/03/2018 1928 Last data filed at 11/03/2018 1836 Gross per 24 hour  Intake 200 ml  Output -  Net 200 ml    Labs/Imaging Results for orders placed or performed during the hospital encounter of 11/03/18 (from the past 48 hour(s))  Blood gas, venous  Status: Abnormal (Preliminary result)   Collection Time: 11/03/18  4:11 PM  Result Value Ref Range   pH, Ven 7.35 7.250 - 7.430   pCO2, Ven 61 (H) 44.0 - 60.0 mmHg   pO2, Ven PENDING 32.0 - 45.0 mmHg   Bicarbonate 33.7 (H) 20.0 - 28.0 mmol/L   Acid-Base Excess 6.1 (H) 0.0 - 2.0 mmol/L   O2 Saturation 46.5 %   Patient temperature 37.0    Collection site VEIN    Sample type VEIN     Comment: Performed at [facility], [address] [place], [place] [address]  CBC with Differential/Platelet     Status: Abnormal   Collection Time: 11/03/18  4:11 PM  Result Value Ref Range   WBC 8.2 4.0  - 10.5 K/uL   RBC 3.73 (L) 4.22 - 5.81 MIL/uL   Hemoglobin 11.3 (L) 13.0 - 17.0 g/dL   HCT 36.3 (L) 39.0 - 52.0 %   MCV 97.3 80.0 - 100.0 fL   MCH 30.3 26.0 - 34.0 pg   MCHC 31.1 30.0 - 36.0 g/dL   RDW 15.0 11.5 - 15.5 %   Platelets 209 150 - 400 K/uL   nRBC 0.0 0.0 - 0.2 %   Neutrophils Relative % 71 %   Neutro Abs 5.9 1.7 - 7.7 K/uL   Lymphocytes Relative 14 %   Lymphs Abs 1.2 0.7 - 4.0 K/uL   Monocytes Relative 13 %   Monocytes Absolute 1.1 (H) 0.1 - 1.0 K/uL   Eosinophils Relative 1 %   Eosinophils Absolute 0.0 0.0 - 0.5 K/uL   Basophils Relative 0 %   Basophils Absolute 0.0 0.0 - 0.1 K/uL   Immature Granulocytes 1 %   Abs Immature Granulocytes 0.05 0.00 - 0.07 K/uL    Comment: Performed at [facility], [address] [place], [place] [address]  Basic metabolic panel     Status: Abnormal   Collection Time: 11/03/18  4:11 PM  Result Value Ref Range   Sodium 136 135 - 145 mmol/L   Potassium 4.2 3.5 - 5.1 mmol/L   Chloride 98 98 - 111 mmol/L   CO2 31 22 - 32 mmol/L   Glucose, Bld 107 (H) 70 - 99 mg/dL   BUN 33 (H) 8 - 23 mg/dL   Creatinine, Ser 1.80 (H) 0.61 - 1.24 mg/dL   Calcium 8.8 (L) 8.9 - 10.3 mg/dL   GFR calc non Af Amer 36 (L) >60 mL/min   GFR calc Af Amer 42 (L) >60 mL/min   Anion gap 7 5 - 15    Comment: Performed at [facility], [address] [place], [place] [address]  Troponin I - ONCE - STAT     Status: Abnormal   Collection Time: 11/03/18  4:11 PM  Result Value Ref Range   Troponin I 0.16 (HH) <0.03 ng/mL    Comment: CRITICAL RESULT CALLED TO, READ BACK BY AND VERIFIED WITH  [facility] Kellyanne Ellwanger AT 1726 11/03/18 SDR Performed at [facility], [address], [place], [place] [address]   Brain natriuretic peptide     Status: Abnormal   Collection Time: 11/03/18  4:11 PM  Result Value Ref Range   B Natriuretic Peptide 428.0 (H) 0.0 - 100.0 pg/mL    Comment: Performed at [facility], [address]  [place], [place] [address]   Dg Chest Portable 1 View  Result Date: 11/03/2018 CLINICAL DATA:  Increase shortness-of-breath. EXAM: PORTABLE CHEST 1 VIEW COMPARISON:  08/30/2018  FINDINGS: Left-sided pacemaker unchanged. Lordotic technique demonstrated. Lungs are adequately inflated demonstrate persistent hazy airspace opacification over the left midlung which may be slightly worse and may represent ongoing infection. No definite effusion. Stable moderate cardiomegaly. Remainder the exam is unchanged. IMPRESSION: Interval worsening hazy airspace process over the left midlung likely infection. Stable cardiomegaly. Electronically Signed   By: Marin Olp M.D.   On: 11/03/2018 16:27    Pending Labs Unresulted Labs (From admission, onward)    Start     Ordered   11/03/18 1828  Procalcitonin - Baseline  ONCE - STAT,   STAT     11/03/18 1827   11/03/18 1823  MRSA PCR Screening  Once,   STAT     11/03/18 1822   11/03/18 1634  Blood culture (routine x 2)  BLOOD CULTURE X 2,   STAT     11/03/18 1633   Signed and Held  Troponin I - Now Then Q6H  Now then every 6 hours,   R     Signed and Held   Signed and Held  Hemoglobin A1c  Once,   R     Signed and Held   Signed and Held  TSH  Once,   R     Signed and Held   Signed and Held  Basic metabolic panel  Tomorrow morning,   R     Signed and Held   Signed and Held  CBC  Tomorrow morning,   R     Signed and Held          Vitals/Pain Today's Vitals   11/03/18 1730 11/03/18 1800 11/03/18 1831 11/03/18 1857  BP: (!) 146/71 (!) 152/49 (!) 129/59   Pulse: 66 60 67 65  Resp: 19 (!) 23 (!) 25 18  Temp:      TempSrc:      SpO2: 96% 93% 96% 96%  Weight:      Height:      PainSc:        Isolation Precautions No active isolations  Medications Medications  albuterol (PROVENTIL) (2.5 MG/3ML) 0.083% nebulizer solution 10 mg (10 mg Nebulization Given 11/03/18 1635)  ceFEPIme (MAXIPIME) 1 g in sodium chloride 0.9 % 100 mL IVPB (0 g Intravenous Stopped  11/03/18 1733)  vancomycin (VANCOCIN) IVPB 1000 mg/200 mL premix (0 mg Intravenous Stopped 11/03/18 1836)    Mobility walks Low fall risk   Focused Assessments    Recommendations: See Admitting Provider Note  Report given to:   Additional Notes:

## 2018-11-03 NOTE — ED Triage Notes (Signed)
Patient arrived by [facility]. Patient complaint is SOB. Per EMS patient received DuoNeb X3 before arrival to ED. Patient states symptoms started last night and started coughing up some blood.  [facility] arrived patient O2 was 76% on room air and was placed on 4L O2. Patient not complaining of any pain. Patient has history of cancer and kidney diease, COPD, CHF, Afib. Patient states started coughing over weekend.

## 2018-11-03 NOTE — H&P (Addendum)
[name] [facility] [place] NAME: Darius Taylor    MR#:  [id number]  DATE OF BIRTH:  20-Mar-1946  DATE OF ADMISSION:  11/03/2018  PRIMARY CARE PHYSICIAN: [facility] [place]   REQUESTING/REFERRING PHYSICIAN: Dr. Rudene Re  CHIEF COMPLAINT:   Chief Complaint  Patient presents with  . Shortness of Breath    HISTORY OF PRESENT ILLNESS:  Darius Taylor  is a 73 y.o. male with a known history of atrial fibrillation on eliquis, CAD, nonischemic cardiomyopathy with last known EF of 25% status post AICD and pacemaker, CKD, history of colonic adenocarcinoma status post partial colectomy, history of renal cell carcinoma status post left nephrectomy, COPD on as needed home oxygen presents to hospital secondary to worsening shortness of breath. Patient lives at home by himself and his daughter lives next door and checks on him.  He states he has had cough with clear phlegm for almost a week now.  His breathing has started to slowly get worse.  He tried his inhalers with no significant improvement.  He has had some chills but no fevers.  Denies any travel or sick contacts.  No nausea or vomiting.  No abdominal pain.  He has 2 L oxygen which he uses as needed.  As his breathing got much worse today, he called EMS.  His sats were noted to be in the 70s.  He is brought to the emergency room. Patient was noted to have increased work of breathing and hypoxia initially and so placed on BiPAP.  Now transition to 6 L oxygen via nasal cannula and feels much better.  Labs indicate an elevated troponin, elevated BNP and chest x-ray showing left midlung airspace disease concerning for pneumonia.  PAST MEDICAL HISTORY:   Past Medical History:  Diagnosis Date  . Aortic stenosis   . Arrhythmia    atrial fibrillation  . Asthma   . Atrial fibrillation ([place])   . CAD (coronary artery disease)   . Cancer [facility])    colon, bladder and kidney  . CHF (congestive  heart failure) ([place])   . 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 COPD (chronic obstructive pulmonary disease) ([place])   . H/O ventricular tachycardia   . Hypertension   . Nonischemic cardiomyopathy ([place])    Status post AICD and pacemaker  . Renal cell carcinoma ([place])     PAST SURGICAL HISTORY:   Past Surgical History:  Procedure Laterality Date  . CARDIAC DEFIBRILLATOR PLACEMENT    . CARDIAC DEFIBRILLATOR PLACEMENT    . COLONOSCOPY WITH PROPOFOL N/A 06/10/2018   Procedure: COLONOSCOPY WITH PROPOFOL;  Surgeon: Lin Landsman, MD;  Location: [facility] ENDOSCOPY;  Service: Gastroenterology;  Laterality: N/A;  . CORONARY ANGIOPLASTY  09/25/2016  . IRRIGATION AND DEBRIDEMENT HEMATOMA Left 10/24/2016   Procedure: IRRIGATION AND DEBRIDEMENT HEMATOMA;  Surgeon: Florene Glen, MD;  Location: [facility] ORS;  Service: General;  Laterality: Left;  . KIDNEY SURGERY     left ne[hrectomy for RCC  . PACEMAKER IMPLANT    . PARTIAL COLECTOMY      SOCIAL HISTORY:   Social History   Tobacco Use  . Smoking status: Former Research scientist (life sciences)  . Smokeless tobacco: Never Used  Substance Use Topics  . Alcohol use: No    FAMILY HISTORY:   Family History  Problem Relation Age of Onset  . Hypertension Mother   . Stroke Father     DRUG ALLERGIES:  No Known Allergies  REVIEW OF SYSTEMS:  Review of Systems  Constitutional: Positive for chills, fever and malaise/fatigue. Negative for weight loss.  HENT: Negative for ear discharge, ear pain, hearing loss and nosebleeds.   Eyes: Negative for blurred vision, double vision and photophobia.  Respiratory: Positive for cough and shortness of breath. Negative for hemoptysis and wheezing.   Cardiovascular: Positive for palpitations. Negative for chest pain, orthopnea and leg swelling.  Gastrointestinal: Positive for nausea. Negative for abdominal pain, constipation, diarrhea, heartburn, melena and vomiting.  Genitourinary: Negative for dysuria, frequency and urgency.    Musculoskeletal: Negative for back pain, myalgias and neck pain.  Skin: Negative for rash.  Neurological: Negative for dizziness, tingling, tremors, sensory change, speech change, focal weakness and headaches.  Endo/Heme/Allergies: Does not bruise/bleed easily.  Psychiatric/Behavioral: Negative for depression.    MEDICATIONS AT HOME:   Prior to Admission medications   Medication Sig Start Date End Date Taking? Authorizing Provider  acetaminophen (TYLENOL) 325 MG tablet Take 650 mg by mouth every 6 (six) hours as needed for mild pain or fever.    Yes [provider]  albuterol (PROVENTIL HFA;VENTOLIN HFA) 108 (90 Base) MCG/ACT inhaler Inhale 1-2 puffs into the lungs every 6 (six) hours as needed for wheezing or shortness of breath.   Yes [provider]  amiodarone (PACERONE) 200 MG tablet Take 200 mg by mouth daily.   Yes [provider]  amLODipine (NORVASC) 10 MG tablet Take 10 mg by mouth daily.   Yes [provider]  apixaban (ELIQUIS) 5 MG TABS tablet Take 5 mg by mouth 2 (two) times daily.   Yes [provider]  aspirin 81 MG chewable tablet Chew 81 mg by mouth daily.   Yes [provider]  brimonidine (ALPHAGAN) 0.15 % ophthalmic solution Place 1 drop into both eyes 3 (three) times daily. 12/20/17  Yes [provider]  cholecalciferol (VITAMIN D) 1000 units tablet Take 1,000 Units by mouth daily.   Yes [provider]  colchicine 0.6 MG tablet Take 1 tablet (0.6 mg total) by mouth 2 (two) times daily. Patient taking differently: Take 0.6 mg by mouth daily.  01/11/18 01/11/19 Yes Dustin Flock, MD  furosemide (LASIX) 40 MG tablet Take 40 mg by mouth See admin instructions. Take 1 tablet (40mg ) by mouth twice daily and 1 additional tablet (40mg ) by mouth if needed for excessive swelling or edema   Yes [provider]  latanoprost (XALATAN) 0.005 % ophthalmic solution Place 1 drop into both eyes at bedtime.  12/20/17  Yes [provider]  magnesium oxide (MAG-OX) 400 MG tablet Take 400 mg by mouth 2 (two) times daily.   Yes [provider]  metoprolol (TOPROL-XL) 200 MG 24 hr tablet Take 200 mg by mouth daily.   Yes [provider]  nitroGLYCERIN (NITROSTAT) 0.4 MG SL tablet Place 0.4 mg under the tongue every 5 (five) minutes as needed for chest pain.   Yes [provider]  spironolactone (ALDACTONE) 25 MG tablet Take 12.5 mg by mouth daily.   Yes [provider]  tiotropium (SPIRIVA HANDIHALER) 18 MCG inhalation capsule Place 1 capsule (18 mcg total) into inhaler and inhale daily. 08/25/18 11/03/18 Yes Sudini, Alveta Heimlich, MD  allopurinol (ZYLOPRIM) 100 MG tablet Take 1 tablet (100 mg total) by mouth daily. Patient not taking: Reported on 11/03/2018 01/11/18 01/11/19  Dustin Flock, MD  atorvastatin (LIPITOR) 80 MG tablet Take 80 mg by mouth daily.    [provider]  guaiFENesin (ROBITUSSIN) 100 MG/5ML SOLN Take 5 mLs (  100 mg total) by mouth every 4 (four) hours as needed for cough or to loosen phlegm. 10/26/16   [name], MD      VITAL SIGNS:  Blood pressure 139/78, pulse 70, temperature 98.4 F (36.9 C), temperature source Oral, resp. rate (!) 22, height 5\' 11"  (1.803 m), weight 97.5 kg, SpO2 96 %.  PHYSICAL EXAMINATION:   Physical Exam  GENERAL:  73 y.o.-year-old patient lying in the bed, appears to be in mild respiratory distress.  EYES: Pupils equal, round, reactive to light and accommodation. No scleral icterus. Extraocular muscles intact.  HEENT: Head atraumatic, normocephalic. Oropharynx and nasopharynx clear.  NECK:  Supple, no jugular venous distention. No thyroid enlargement, no tenderness.  LUNGS: Normal breath sounds bilaterally, decreased at left lower lung, no wheezing, rales,rhonchi or crepitation. No use of accessory muscles of respiration.  CARDIOVASCULAR: S1, S2 normal. No rubs, or gallops.  3/6 systolic murmur is  present ABDOMEN: Soft, nontender, nondistended. Bowel sounds present. No organomegaly or mass.  EXTREMITIES: No pedal edema, cyanosis, or clubbing.  NEUROLOGIC: Cranial nerves II through XII are intact. Muscle strength 5/5 in all extremities. Sensation intact. Gait not checked.  PSYCHIATRIC: The patient is alert and oriented x 3.  SKIN: No obvious rash, lesion, or ulcer.   LABORATORY PANEL:   CBC Recent Labs  Lab 11/03/18 1611  WBC 8.2  HGB 11.3*  HCT 36.3*  PLT 209   ------------------------------------------------------------------------------------------------------------------  Chemistries  Recent Labs  Lab 11/03/18 1611  NA 136  K 4.2  CL 98  CO2 31  GLUCOSE 107*  BUN 33*  CREATININE 1.80*  CALCIUM 8.8*   ------------------------------------------------------------------------------------------------------------------  Cardiac Enzymes Recent Labs  Lab 11/03/18 1611  TROPONINI 0.16*   ------------------------------------------------------------------------------------------------------------------  RADIOLOGY:  Dg Chest Portable 1 View  Result Date: 11/03/2018 CLINICAL DATA:  Increase shortness-of-breath. EXAM: PORTABLE CHEST 1 VIEW COMPARISON:  08/30/2018 FINDINGS: Left-sided pacemaker unchanged. Lordotic technique demonstrated. Lungs are adequately inflated demonstrate persistent hazy airspace opacification over the left midlung which may be slightly worse and may represent ongoing infection. No definite effusion. Stable moderate cardiomegaly. Remainder the exam is unchanged. IMPRESSION: Interval worsening hazy airspace process over the left midlung likely infection. Stable cardiomegaly. Electronically Signed   By: Marin Olp M.D.   On: 11/03/2018 16:27    EKG:   Orders placed or performed during the hospital encounter of 11/03/18  . ED EKG  . ED EKG  . EKG 12-Lead  . EKG 12-Lead    IMPRESSION AND PLAN:   Dawsyn Ramsaran  is a 73 y.o. male with a known  history of atrial fibrillation on Coumadin, CAD, nonischemic cardiomyopathy with last known EF of 25% status post AICD and pacemaker, history of colonic adenocarcinoma status post partial colectomy, history of renal cell carcinoma status post left nephrectomy, COPD on as needed home oxygen presents to hospital secondary to worsening shortness of breath.  1.  Acute hypoxic respiratory failure-secondary to pneumonia, left lower lobe infiltrate and noted on chest x-ray -Also some scattered expiratory wheezing noted.  We will add some steroids as well -Blood cultures, MRSA PCR pending.  Due to recent hospitalization couple of months ago we will treat as HCAP.  On vancomycin and cefepime for now -Follow-up procalcitonin -Currently on 6 L oxygen via nasal cannula.  Wean as tolerated.  2.  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  Follows with [facility] cardiology. -Coarse crackles on exam noted.  Will change Lasix to IV twice daily for now. -  Daily weights and strict input and output monitoring.  Continue cardiac medications. -Note:  troponin is chronically elevated.  recycle and monitor  3.  Hypertension-continue home medications.  Patient on Norvasc and Toprol  4.  Atrial fibrillation-status post AICD and pacemaker due to history of V. tach. -Rate controlled.  On Toprol -On Eliquis for anticoagulation.  Also on amiodarone  5.  CKD-stable.  Baseline creatinine seems to be around 1.7, monitor while on diuretics.  6.  DVT prophylaxis-already on Eliquis    All the records are reviewed and case discussed with ED provider. Management plans discussed with the patient, family and they are in agreement.  CODE STATUS: Full Code  TOTAL TIME TAKING CARE OF THIS PATIENT: 51 minutes.    Gladstone Lighter M.D on 11/03/2018 at 6:20 PM  Between 7am to 6pm - Pager - [phone]  After 6pm go to [email or web address] - password [email or web address] [facility]  Office   [phone]  CC: Primary care physician; [employer]

## 2018-11-03 NOTE — ED Notes (Signed)
O2 changed to 6lpm for O2 sat 91% on 4lpm.

## 2018-11-03 NOTE — ED Notes (Signed)
O2 changed to 4L. Sat 96%.

## 2018-11-03 NOTE — ED Provider Notes (Signed)
[facility] Emergency Department Provider Note  ____________________________________________  Time seen: Approximately 4:06 PM  I have reviewed the triage vital signs and the nursing notes.   HISTORY  Chief Complaint Shortness of Breath   HPI Darius Taylor is a 73 y.o. male with a history of atrial fibrillation on Eliquis, colon and kidney cancer currently in remission, CAD, CHF, COPD, hypertension, AICD who presents for evaluation of shortness of breath.  Patient reports several days of cough productive of clear sputum with some blood streak and progressively worsening shortness of breath.  Has tried his inhalers at home initially with some relief but today with no relief.  Today the shortness of breath became severe.  Patient denies chest pain.  He has had a history of DVT and endorses compliance with his Eliquis.  He denies fever or chills, vomiting or diarrhea, abdominal pain.  Patient has oxygen at home as needed as needed and has been using it since this morning.  Per EMS patient initial sats were in the 58s.  He received 3 duo nebs and 125 mg of Solu-Medrol in route.  Patient reports improvement of his respiratory status on arrival.  Past Medical History:  Diagnosis Date  . Aortic stenosis   . Arrhythmia    atrial fibrillation  . Asthma   . Atrial fibrillation ([place])   . CAD (coronary artery disease)   . Cancer [facility])    colon, bladder and kidney  . CHF (congestive heart failure) ([place])   . COPD (chronic obstructive pulmonary disease) ([place])   . Hypertension     Patient Active Problem List   Diagnosis Date Noted  . Chronic diastolic heart failure ([place]) 09/24/2018  . HTN (hypertension) 09/24/2018  . Acute on chronic respiratory failure with hypoxemia ([place]) 08/30/2018  . History of colon cancer   . Cellulitis 11/18/2017  . Hematoma     Past Surgical History:  Procedure Laterality Date  . CARDIAC DEFIBRILLATOR PLACEMENT    . COLONOSCOPY WITH  PROPOFOL N/A 06/10/2018   Procedure: COLONOSCOPY WITH PROPOFOL;  Surgeon: Lin Landsman, MD;  Location: [facility] ENDOSCOPY;  Service: Gastroenterology;  Laterality: N/A;  . CORONARY ANGIOPLASTY  09/25/2016  . IRRIGATION AND DEBRIDEMENT HEMATOMA Left 10/24/2016   Procedure: IRRIGATION AND DEBRIDEMENT HEMATOMA;  Surgeon: Florene Glen, MD;  Location: [facility] ORS;  Service: General;  Laterality: Left;  . KIDNEY SURGERY     left removed  . PACEMAKER IMPLANT      Prior to Admission medications   Medication Sig Start Date End Date Taking? Authorizing Provider  acetaminophen (TYLENOL) 325 MG tablet Take 650 mg by mouth every 6 (six) hours as needed.     [provider]  albuterol (PROVENTIL HFA;VENTOLIN HFA) 108 (90 Base) MCG/ACT inhaler Inhale 1-2 puffs into the lungs every 6 (six) hours as needed for wheezing or shortness of breath.    [provider]  allopurinol (ZYLOPRIM) 100 MG tablet Take 1 tablet (100 mg total) by mouth daily. 01/11/18 01/11/19  Dustin Flock, MD  amiodarone (PACERONE) 200 MG tablet Take 200 mg by mouth daily.    [provider]  amLODipine (NORVASC) 10 MG tablet Take 10 mg by mouth daily.    [provider]  apixaban (ELIQUIS) 5 MG TABS tablet Take 5 mg by mouth 2 (two) times daily.    [provider]  aspirin 81 MG chewable tablet Chew 81 mg by mouth daily.    [provider]  brimonidine (ALPHAGAN) 0.15 %  ophthalmic solution Place 1 drop into both eyes 3 (three) times daily. 12/20/17   [provider]  budesonide-formoterol (SYMBICORT) 160-4.5 MCG/ACT inhaler Inhale 2 puffs into the lungs 2 (two) times daily.    [provider]  cholecalciferol (VITAMIN D) 1000 units tablet Take 1,000 Units by mouth daily.    [provider]  colchicine 0.6 MG tablet Take 1 tablet (0.6 mg total) by mouth 2 (two) times daily. 01/11/18 01/11/19  Dustin Flock, MD  furosemide (LASIX) 40 MG tablet Take 40 mg by  mouth 2 (two) times daily.     [provider]  guaiFENesin (ROBITUSSIN) 100 MG/5ML SOLN Take 5 mLs (100 mg total) by mouth every 4 (four) hours as needed for cough or to loosen phlegm. 10/26/16   [name], MD  latanoprost (XALATAN) 0.005 % ophthalmic solution Place 1 drop into both eyes at bedtime. 12/20/17   [provider]  magnesium oxide (MAG-OX) 400 MG tablet Take 400 mg by mouth 2 (two) times daily.    [provider]  metoprolol (TOPROL-XL) 200 MG 24 hr tablet Take 200 mg by mouth daily.    [provider]  nitroGLYCERIN (NITROSTAT) 0.4 MG SL tablet Place 0.4 mg under the tongue every 5 (five) minutes as needed for chest pain.    [provider]  spironolactone (ALDACTONE) 25 MG tablet Take 12.5 mg by mouth daily.    [provider]  tiotropium (SPIRIVA HANDIHALER) 18 MCG inhalation capsule Place 1 capsule (18 mcg total) into inhaler and inhale daily. 08/25/18 09/24/18  [name], MD    Allergies Patient has no known allergies.  Family History  Problem Relation Age of Onset  . Hypertension Mother     Social History Social History   Tobacco Use  . Smoking status: Former Research scientist (life sciences)  . Smokeless tobacco: Never Used  Substance Use Topics  . Alcohol use: No  . Drug use: No    Review of Systems  Constitutional: Negative for fever. Eyes: Negative for visual changes. ENT: Negative for sore throat. Neck: No neck pain  Cardiovascular: Negative for chest pain. Respiratory: + shortness of breath and hemoptysis Gastrointestinal: Negative for abdominal pain, vomiting or diarrhea. Genitourinary: Negative for dysuria. Musculoskeletal: Negative for back pain. Skin: Negative for rash. Neurological: Negative for headaches, weakness or numbness. Psych: No SI or HI  ____________________________________________   PHYSICAL EXAM:  VITAL SIGNS: Vitals:   11/03/18 1610 11/03/18 1612  BP:  139/78  Pulse:  70  Resp:  (!) 22    Temp:  98.4 F (36.9 C)  SpO2: 96% 96%    Constitutional: Alert and oriented, moderate respiratory distress. HEENT:      Head: Normocephalic and atraumatic.         Eyes: Conjunctivae are normal. Sclera is non-icteric.       Mouth/Throat: Mucous membranes are moist.       Neck: Supple with no signs of meningismus. Cardiovascular: Regular rate and rhythm. No murmurs, gallops, or rubs. 2+ symmetrical distal pulses are present in all extremities. No JVD. Respiratory: Increased work of breathing, tachypnea, hypoxic which improves on 5 L nasal cannula, diffuse expiratory wheezes bilaterally  gastrointestinal: Soft, non tender, and non distended with positive bowel sounds. No rebound or guarding. Musculoskeletal: Nontender with normal range of motion in all extremities. No edema, cyanosis, or erythema of extremities. Neurologic: Normal speech and language. Face is symmetric. Moving all extremities. No gross focal neurologic deficits are appreciated. Skin: Skin is warm, dry and intact.  No rash noted. Psychiatric: Mood and affect are normal. Speech and behavior are normal.  ____________________________________________   LABS (all labs ordered are listed, but only abnormal results are displayed)  Labs Reviewed  BLOOD GAS, VENOUS - Abnormal; Notable for the following components:      Result Value   pCO2, Ven 61 (*)    Bicarbonate 33.7 (*)    Acid-Base Excess 6.1 (*)    All other components within normal limits  CULTURE, BLOOD (ROUTINE X 2)  CULTURE, BLOOD (ROUTINE X 2)  CBC WITH DIFFERENTIAL/PLATELET  BASIC METABOLIC PANEL  TROPONIN I  BRAIN NATRIURETIC PEPTIDE   ____________________________________________  EKG  ED ECG REPORT I, Rudene Re, the attending physician, personally viewed and interpreted this ECG.  First-degree AV block, rate of 69, incomplete left bundle branch block, left axis deviation, diffuse T wave flattening with no ST elevations. Not paced rhythm when  compared to prior ____________________________________________  RADIOLOGY  I have personally reviewed the images performed during this visit and I agree with the Radiologist's read.   Interpretation by Radiologist:  Dg Chest Portable 1 View  Result Date: 11/03/2018 CLINICAL DATA:  Increase shortness-of-breath. EXAM: PORTABLE CHEST 1 VIEW COMPARISON:  08/30/2018 FINDINGS: Left-sided pacemaker unchanged. Lordotic technique demonstrated. Lungs are adequately inflated demonstrate persistent hazy airspace opacification over the left midlung which may be slightly worse and may represent ongoing infection. No definite effusion. Stable moderate cardiomegaly. Remainder the exam is unchanged. IMPRESSION: Interval worsening hazy airspace process over the left midlung likely infection. Stable cardiomegaly. Electronically Signed   By: Marin Olp M.D.   On: 11/03/2018 16:27      ____________________________________________   PROCEDURES  Procedure(s) performed: None Procedures Critical Care performed: yes  CRITICAL CARE Performed by: Rudene Re  ?  Total critical care time: 40 min  Critical care time was exclusive of separately billable procedures and treating other patients.  Critical care was necessary to treat or prevent imminent or life-threatening deterioration.  Critical care was time spent personally by me on the following activities: development of treatment plan with patient and/or surrogate as well as nursing, discussions with consultants, evaluation of patient's response to treatment, examination of patient, obtaining history from patient or surrogate, ordering and performing treatments and interventions, ordering and review of laboratory studies, ordering and review of radiographic studies, pulse oximetry and re-evaluation of patient's condition.  ____________________________________________   INITIAL IMPRESSION / ASSESSMENT AND PLAN / ED COURSE  72 y.o. male with a  history of atrial fibrillation on Eliquis, colon and kidney cancer currently in remission, CAD, CHF, COPD, hypertension, AICD who presents for evaluation of shortness of breath and hemoptysis. No fever or CP.  Patient arrives in moderate respiratory distress, hypoxic requiring 5 L nasal cannula, diffuse expiratory wheezes throughout.  Patient looks euvolemic.  No asymmetric pitting edema.  Differential diagnoses including COPD exacerbation versus CHF versus pneumonia versus bronchitis.  PE is also possibility however patient is appropriately anticoagulated with Eliquis therefore less likely.  Patient has already received 3 duo nebs and Solu-Medrol before arrival.  Will put patient on BiPAP, give 10 mg of albuterol.  Chest x-ray, EKG and labs are pending.    _________________________ 4:34 PM on 11/03/2018 -----------------------------------------  Chest x-ray concerning for pneumonia.  Since patient was hospitalized 2 months ago for the same will cover for H CAP with cefepime and vancomycin.  Patient is a lot more comfortable currently on BiPAP.  Labs are pending but at this time no signs of sepsis.  VBG  showing normal pH of 7.35.  Will discuss with the hospitalist for admission.   As part of my medical decision making, I reviewed the following data within the [facility] notes reviewed and incorporated, Labs reviewed , EKG interpreted , Old EKG reviewed, Old chart reviewed, Radiograph reviewed , Discussed with admitting physician , Notes from prior ED visits and [place] Controlled Substance Database    Pertinent labs & imaging results that were available during my care of the patient were reviewed by me and considered in my medical decision making (see chart for details).    ____________________________________________   FINAL CLINICAL IMPRESSION(S) / ED DIAGNOSES  Final diagnoses:  Acute respiratory failure with hypoxia ([place])  HCAP (healthcare-associated pneumonia)       NEW MEDICATIONS STARTED DURING THIS VISIT:  ED Discharge Orders    None       Note:  This document was prepared using Dragon voice recognition software and may include unintentional dictation errors.    Alfred Levins, [place], MD 11/03/18 [phone]

## 2018-11-04 ENCOUNTER — Inpatient Hospital Stay (HOSPITAL_COMMUNITY)
Admit: 2018-11-04 | Discharge: 2018-11-04 | Disposition: A | Discharge status: Home / Self Care | Payer: Medicare HMO | Attending: Internal Medicine | Admitting: Internal Medicine

## 2018-11-04 DIAGNOSIS — I340 Nonrheumatic mitral (valve) insufficiency: Secondary | ICD-10-CM

## 2018-11-04 DIAGNOSIS — I361 Nonrheumatic tricuspid (valve) insufficiency: Secondary | ICD-10-CM

## 2018-11-04 LAB — CBC
HCT: 33.4 % — ABNORMAL LOW (ref 39.0–52.0)
Hemoglobin: 10.6 g/dL — ABNORMAL LOW (ref 13.0–17.0)
MCH: 30 pg (ref 26.0–34.0)
MCHC: 31.7 g/dL (ref 30.0–36.0)
MCV: 94.6 fL (ref 80.0–100.0)
NRBC: 0 % (ref 0.0–0.2)
Platelets: 207 10*3/uL (ref 150–400)
RBC: 3.53 MIL/uL — AB (ref 4.22–5.81)
RDW: 14.6 % (ref 11.5–15.5)
WBC: 9.5 10*3/uL (ref 4.0–10.5)

## 2018-11-04 LAB — BASIC METABOLIC PANEL
Anion gap: 8 (ref 5–15)
BUN: 41 mg/dL — ABNORMAL HIGH (ref 8–23)
CO2: 28 mmol/L (ref 22–32)
Calcium: 8.6 mg/dL — ABNORMAL LOW (ref 8.9–10.3)
Chloride: 101 mmol/L (ref 98–111)
Creatinine, Ser: 1.69 mg/dL — ABNORMAL HIGH (ref 0.61–1.24)
GFR calc Af Amer: 45 mL/min — ABNORMAL LOW (ref >60)
GFR calc non Af Amer: 39 mL/min — ABNORMAL LOW (ref >60)
Glucose, Bld: 165 mg/dL — ABNORMAL HIGH (ref 70–99)
Potassium: 4.2 mmol/L (ref 3.5–5.1)
SODIUM: 137 mmol/L (ref 135–145)

## 2018-11-04 LAB — TROPONIN I
Troponin I: 0.19 ng/mL (ref <0.03)
Troponin I: 0.15 ng/mL (ref <0.03)

## 2018-11-04 LAB — HEMOGLOBIN A1C
Hgb A1c MFr Bld: 6.2 % — ABNORMAL HIGH (ref 4.8–5.6)
Mean Plasma Glucose: 131.24 mg/dL

## 2018-11-04 MED ORDER — MENTHOL 3 MG MT LOZG
1.0000 | LOZENGE | OROMUCOSAL | Status: DC | PRN
  Administered 2018-11-05: 3 mg via ORAL
  Filled 2018-11-04: qty 9

## 2018-11-04 MED ORDER — BENZONATATE 100 MG PO CAPS
100.0000 mg | ORAL_CAPSULE | Freq: Three times a day (TID) | ORAL | Status: DC | PRN
  Administered 2018-11-04 – 2018-11-06 (×4): 100 mg via ORAL
  Filled 2018-11-04 (×4): qty 1

## 2018-11-04 NOTE — Progress Notes (Signed)
[facility] [place] NAME: Darius Taylor    MR#:  [id number]  DATE OF BIRTH:  06-06-1946  SUBJECTIVE:   She presented to the hospital secondary to shortness of breath and noted to be in acute respiratory failure with hypoxia secondary to pneumonia.  Patient also noted to be in some mild CHF.  Feels a bit better today.  Denies any chest pains, nausea or vomiting.  REVIEW OF SYSTEMS:    Review of Systems  Constitutional: Negative for chills and fever.  HENT: Negative for congestion and tinnitus.   Eyes: Negative for blurred vision and double vision.  Respiratory: Positive for cough and shortness of breath. Negative for wheezing.   Cardiovascular: Negative for chest pain, orthopnea and PND.  Gastrointestinal: Negative for abdominal pain, diarrhea, nausea and vomiting.  Genitourinary: Negative for dysuria and hematuria.  Neurological: Negative for dizziness, sensory change and focal weakness.  All other systems reviewed and are negative.   Nutrition: Heart Healthy Tolerating Diet: Yes Tolerating PT: Await Eval.   DRUG ALLERGIES:  No Known Allergies  VITALS:  Blood pressure (!) 136/57, pulse 70, temperature 97.7 F (36.5 C), resp. rate 18, height 5\' 11"  (1.803 m), weight 96.2 kg, SpO2 94 %.  PHYSICAL EXAMINATION:   Physical Exam  GENERAL:  73 y.o.-year-old obese patient lying in bed in NAD.  EYES: Pupils equal, round, reactive to light and accommodation. No scleral icterus. Extraocular muscles intact.  HEENT: Head atraumatic, normocephalic. Oropharynx and nasopharynx clear.  NECK:  Supple, no jugular venous distention. No thyroid enlargement, no tenderness.  LUNGS: Good air entry bilaterally, minimal end expiratory wheezing bilaterally, basilar rales, no rhonchi, negative use of accessory muscles. CARDIOVASCULAR: S1, S2 normal. No murmurs, rubs, or gallops.  ABDOMEN: Soft, nontender, nondistended. Bowel sounds present. No  organomegaly or mass.  EXTREMITIES: No cyanosis, clubbing, + 1 edema b/l.   NEUROLOGIC: Cranial nerves II through XII are intact. No focal Motor or sensory deficits b/l. Globally weak.   PSYCHIATRIC: The patient is alert and oriented x 3.  SKIN: No obvious rash, lesion, or ulcer.    LABORATORY PANEL:   CBC Recent Labs  Lab 11/04/18 0818  WBC 9.5  HGB 10.6*  HCT 33.4*  PLT 207   ------------------------------------------------------------------------------------------------------------------  Chemistries  Recent Labs  Lab 11/04/18 0818  NA 137  K 4.2  CL 101  CO2 28  GLUCOSE 165*  BUN 41*  CREATININE 1.69*  CALCIUM 8.6*   ------------------------------------------------------------------------------------------------------------------  Cardiac Enzymes Recent Labs  Lab 11/04/18 0818  TROPONINI 0.15*   ------------------------------------------------------------------------------------------------------------------  RADIOLOGY:  Dg Chest Portable 1 View  Result Date: 11/03/2018 CLINICAL DATA:  Increase shortness-of-breath. EXAM: PORTABLE CHEST 1 VIEW COMPARISON:  08/30/2018 FINDINGS: Left-sided pacemaker unchanged. Lordotic technique demonstrated. Lungs are adequately inflated demonstrate persistent hazy airspace opacification over the left midlung which may be slightly worse and may represent ongoing infection. No definite effusion. Stable moderate cardiomegaly. Remainder the exam is unchanged. IMPRESSION: Interval worsening hazy airspace process over the left midlung likely infection. Stable cardiomegaly. Electronically Signed   By: Marin Olp M.D.   On: 11/03/2018 16:27     ASSESSMENT AND PLAN:   Darius Taylor  is a 73 y.o. male with a known history of atrial fibrillation on Coumadin, CAD, nonischemic cardiomyopathy with last known EF of 25% status post AICD and pacemaker, history of colonic adenocarcinoma status post partial colectomy, history of renal cell  carcinoma status post left nephrectomy, COPD on as needed  home oxygen presents to hospital secondary to worsening shortness of breath.  1.  Acute hypoxic respiratory failure-secondary to pneumonia, left lower lobe infiltrate and noted on chest x-ray - Continue broad-spectrum IV antibiotics with vancomycin, cefepime.  MRSA PCR is positive. - Clinically improving.  Procalcitonin level is normal.  Continue O2 and wean as tolerated. - Continue IV steroids for the wheezing, bronchospasm and will switch to oral prednisone tomorrow.  2.  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  Follows with [facility] cardiology. -Continue IV Lasix, follow I's and O's and daily weights.  Continue Toprol, Aldactone - clinically improving.   3.  Hypertension- cont. Norvasc, Toprol  4.  Atrial fibrillation-status post AICD and pacemaker due to history of V. tach. -Rate controlled.  On Toprol -On Eliquis for anticoagulation.  cont. Amiodarone and stable.   5.  CKD-stable.  Baseline creatinine seems to be around 1.7 and currently at baseline   6.  Glaucoma-continue latanoprost eyedrops.   All the records are reviewed and case discussed with Care Management/Social Worker. Management plans discussed with the patient, family and they are in agreement.  CODE STATUS: Full code  DVT Prophylaxis: Eliquis  TOTAL TIME TAKING CARE OF THIS PATIENT: 30 minutes.   POSSIBLE D/C IN 1-2 DAYS, DEPENDING ON CLINICAL CONDITION.   Henreitta Leber M.D on 11/04/2018 at 1:38 PM  Between 7am to 6pm - Pager - [phone]  After 6pm go to [email or web address] - Technical brewer [facility]  Office  [phone]  CC: Primary care physician; [employer], [employer]

## 2018-11-04 NOTE — Evaluation (Signed)
Physical Therapy Evaluation Patient Details Name: Darius Taylor MRN: [id number] DOB: 06/08/1946 Today's Date: 11/04/2018   History of Present Illness  From MD H&P: Pt is a 73 y.o. male with a known history of atrial fibrillation on eliquis, CAD, nonischemic cardiomyopathy with last known EF of 25% status post AICD and pacemaker, CKD, history of colonic adenocarcinoma status post partial colectomy, history of renal cell carcinoma status post left nephrectomy, COPD on as needed home oxygen presented to the hospital secondary to worsening shortness of breath. He stated he had a cough for almost a week.  His breathing started to slowly get worse.  His sats were noted to be in the 70s with EMS.  Patient was noted to have increased work of breathing and hypoxia initially and so placed on BiPAP in the ED.  Labs indicated an elevated troponin, elevated BNP and chest x-ray showed left midlung airspace disease concerning for pneumonia.  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     Clinical Impression  Pt presents with deficits in strength, transfers, mobility, gait, balance, and activity tolerance.  Pt required extra time an effort with bed mobility tasks but no physical assistance.  The pt was abel to perform sit to/from stands from an elevated surface with fair eccentric and concentric control.  The pt initially requested to attempt amb without an AD per his baseline but after taking one step requested a RW secondary to weakness.  Pt was able to amb 100' with CGA with SpO2 dropping from 94% to 86% on 4LO2/min requiring around 2 min of PLB to return to >/= 92%, nursing notified.  Pt will benefit from HHPT services upon discharge to safely address above deficits for decreased caregiver assistance, decreased risk of further functional decline, and eventual return to PLOF.      Follow Up  Recommendations Home health PT    Equipment Recommendations  None recommended by PT    Recommendations for Other Services       Precautions / Restrictions Precautions Precautions: Fall Restrictions Weight Bearing Restrictions: No      Mobility  Bed Mobility Overal bed mobility: Modified Independent             General bed mobility comments: Extra time and effort but no physical assistance required  Transfers Overall transfer level: Needs assistance Equipment used: Rolling walker (2 wheeled) Transfers: Sit to/from Stand Sit to Stand: Min guard         General transfer comment: Fair eccentric and concentric control   Ambulation/Gait Ambulation/Gait assistance: Min guard Gait Distance (Feet): 100 Feet Assistive device: Rolling walker (2 wheeled) Gait Pattern/deviations: Step-through pattern;Decreased step length - right;Decreased step length - left;Trunk flexed Gait velocity: decreased   General Gait Details: Pt initially requested to attempt amb without an AD per his baseline but after taking one step requested the RW secondary to weakness; Pt's SpO2 dropped from 94% to 86% after amb on 4LO2/min requiring around 2 min of PLB to return to >/= 92%.  Stairs            Wheelchair Mobility    Modified Rankin (Stroke Patients Only)       Balance Overall balance assessment: Mild deficits observed, not formally tested  Pertinent Vitals/Pain Pain Assessment: No/denies pain    Home Living Family/patient expects to be discharged to:: Private residence Living Arrangements: Alone Available Help at Discharge: Family;Available PRN/intermittently(Daughter lives next door) Type of Home: House Home Access: Stairs to enter Entrance Stairs-Rails: Right;Left;Can reach both Technical brewer of Steps: 3 Home Layout: One level Home Equipment: Shower seat;Cane - quad;Walker - 4 wheels;Walker - 2  wheels;Grab bars - toilet;Grab bars - tub/shower      Prior Function Level of Independence: Independent         Comments: Ind amb community distances without an AD, no fall history, PRN O2 at home but typically does not use O2 with community ambulation, Ind with ADLs     Hand Dominance        Extremity/Trunk Assessment   Upper Extremity Assessment Upper Extremity Assessment: Overall WFL for tasks assessed    Lower Extremity Assessment Lower Extremity Assessment: Generalized weakness       Communication   Communication: No difficulties  Cognition Arousal/Alertness: Awake/alert Behavior During Therapy: WFL for tasks assessed/performed Overall Cognitive Status: Within Functional Limits for tasks assessed                                        General Comments      Exercises Total Joint Exercises Ankle Circles/Pumps: Strengthening;Both;10 reps Quad Sets: Strengthening;Both;10 reps Gluteal Sets: Strengthening;Both;10 reps Hip ABduction/ADduction: AROM;10 reps Straight Leg Raises: AROM;Both;10 reps Long Arc Quad: Both;Strengthening;10 reps Knee Flexion: Strengthening;Both;10 reps Marching in Standing: AROM;Both;10 reps;Seated   Assessment/Plan    PT Assessment Patient needs continued PT services  PT Problem List Decreased balance;Decreased activity tolerance;Decreased strength;Decreased mobility;Cardiopulmonary status limiting activity       PT Treatment Interventions DME instruction;Gait training;Stair training;Functional mobility training;Therapeutic activities;Therapeutic exercise;Balance training;Patient/family education    PT Goals (Current goals can be found in the Care Plan section)  Acute Rehab PT Goals Patient Stated Goal: Improved endurance PT Goal Formulation: With patient Time For Goal Achievement: 11/17/18 Potential to Achieve Goals: Good    Frequency Min 2X/week   Barriers to discharge        Co-evaluation                AM-PAC PT "6 Clicks" Mobility  Outcome Measure Help needed turning from your back to your side while in a flat bed without using bedrails?: A Little Help needed moving from lying on your back to sitting on the side of a flat bed without using bedrails?: A Little Help needed moving to and from a bed to a chair (including a wheelchair)?: A Little Help needed standing up from a chair using your arms (e.g., wheelchair or bedside chair)?: A Little Help needed to walk in hospital room?: A Little Help needed climbing 3-5 steps with a railing? : A Little 6 Click Score: 18    End of Session Equipment Utilized During Treatment: Gait belt;Oxygen Activity Tolerance: Patient limited by fatigue Patient left: in bed;Other (comment);with bed alarm set;with call bell/phone within reach(Pt declined up in chair) Nurse Communication: Mobility status;Other (comment)(SpO2 drop to 86% on 4LO2/min after amb) PT Visit Diagnosis: Muscle weakness (generalized) (M62.81);Difficulty in walking, not elsewhere classified (R26.2)    Time: 3154-0086 PT Time Calculation (min) (ACUTE ONLY): 33 min   Charges:   PT Evaluation $PT Eval Low Complexity: 1 Low PT Treatments $Therapeutic Exercise: 8-22 mins        D. Scott  Tirzah Fross PT, DPT 11/04/18, 12:00 PM

## 2018-11-04 NOTE — Plan of Care (Signed)
Receiving IV steroids and IV abx.  Problem: Pain Managment: Goal: General experience of comfort will improve Outcome: Progressing Note:  No complaints of pain this shift   Problem: Safety: Goal: Ability to remain free from injury will improve Outcome: Progressing Note:  Up in room with 1 assist with the urinal   Problem: Cardiac: Goal: Ability to achieve and maintain adequate cardiopulmonary perfusion will improve Outcome: Progressing Note:  Receiving IV lasix

## 2018-11-04 NOTE — Progress Notes (Signed)
Pt requesting something else for cough, pt has robitussin q4hr but not really helping, Dr. Bridgett Larsson gave orders for tessalon TID PRN. Will give and continue to monitor. [place] Simmonds, RN, BSN

## 2018-11-05 ENCOUNTER — Inpatient Hospital Stay: Payer: Medicare HMO

## 2018-11-05 LAB — BASIC METABOLIC PANEL
Anion gap: 7 (ref 5–15)
BUN: 55 mg/dL — ABNORMAL HIGH (ref 8–23)
CO2: 29 mmol/L (ref 22–32)
Calcium: 8.7 mg/dL — ABNORMAL LOW (ref 8.9–10.3)
Chloride: 100 mmol/L (ref 98–111)
Creatinine, Ser: 1.72 mg/dL — ABNORMAL HIGH (ref 0.61–1.24)
GFR calc Af Amer: 44 mL/min — ABNORMAL LOW (ref >60)
GFR calc non Af Amer: 38 mL/min — ABNORMAL LOW (ref >60)
Glucose, Bld: 174 mg/dL — ABNORMAL HIGH (ref 70–99)
POTASSIUM: 4.8 mmol/L (ref 3.5–5.1)
Sodium: 136 mmol/L (ref 135–145)

## 2018-11-05 LAB — CBC
HEMATOCRIT: 33.1 % — AB (ref 39.0–52.0)
Hemoglobin: 10.3 g/dL — ABNORMAL LOW (ref 13.0–17.0)
MCH: 29.7 pg (ref 26.0–34.0)
MCHC: 31.1 g/dL (ref 30.0–36.0)
MCV: 95.4 fL (ref 80.0–100.0)
Platelets: 216 10*3/uL (ref 150–400)
RBC: 3.47 MIL/uL — ABNORMAL LOW (ref 4.22–5.81)
RDW: 14.8 % (ref 11.5–15.5)
WBC: 14.5 10*3/uL — ABNORMAL HIGH (ref 4.0–10.5)
nRBC: 0 % (ref 0.0–0.2)

## 2018-11-05 LAB — BLOOD GAS, VENOUS
Acid-Base Excess: 6.1 mmol/L — ABNORMAL HIGH (ref 0.0–2.0)
BICARBONATE: 33.7 mmol/L — AB (ref 20.0–28.0)
O2 Saturation: 46.5 %
Patient temperature: 37
pCO2, Ven: 61 mmHg — ABNORMAL HIGH (ref 44.0–60.0)
pH, Ven: 7.35 (ref 7.250–7.430)

## 2018-11-05 LAB — ECHOCARDIOGRAM COMPLETE
Height: 71 in
Weight: 3392 oz

## 2018-11-05 LAB — EXPECTORATED SPUTUM ASSESSMENT W GRAM STAIN, RFLX TO RESP C: Special Requests: NORMAL

## 2018-11-05 LAB — EXPECTORATED SPUTUM ASSESSMENT W REFEX TO RESP CULTURE

## 2018-11-05 MED ORDER — GUAIFENESIN ER 600 MG PO TB12
600.0000 mg | ORAL_TABLET | Freq: Two times a day (BID) | ORAL | Status: DC
  Administered 2018-11-05 – 2018-11-12 (×15): 600 mg via ORAL
  Filled 2018-11-05 (×15): qty 1

## 2018-11-05 MED ORDER — SODIUM CHLORIDE 0.9 % IV SOLN
2.0000 g | INTRAVENOUS | Status: AC
  Administered 2018-11-06 – 2018-11-09 (×4): 2 g via INTRAVENOUS
  Filled 2018-11-05 (×4): qty 2

## 2018-11-05 MED ORDER — FUROSEMIDE 40 MG PO TABS
40.0000 mg | ORAL_TABLET | Freq: Two times a day (BID) | ORAL | Status: DC
  Administered 2018-11-05 – 2018-11-12 (×14): 40 mg via ORAL
  Filled 2018-11-05 (×14): qty 1

## 2018-11-05 MED ORDER — DOCUSATE SODIUM 100 MG PO CAPS
100.0000 mg | ORAL_CAPSULE | Freq: Two times a day (BID) | ORAL | Status: DC
  Administered 2018-11-06 – 2018-11-12 (×10): 100 mg via ORAL
  Filled 2018-11-05 (×13): qty 1

## 2018-11-05 NOTE — Progress Notes (Signed)
Pt requesting something to help his bowels move, spoke with Dr. Anselm Jungling, he gave orders for colace 100mg  BID. Will give and continue to monitor. [place] Simmonds, RN, BSN

## 2018-11-05 NOTE — Care Management Note (Signed)
Case Management Note  Patient Details  Name: AMORY SIMONETTI MRN: [id number] Date of Birth: 1946/08/25  Subjective/Objective:   Patient is from home.  Admitted with acute on chronic CHF.  Uses 2L oxygen at home PRN.  Has a scale and weighs regularly.   Patient is followed by [facility].  Obtains his medications without difficulty.  Current with his PCP.  PT has recommended HH PT.  Medicare.gov list provided.  Referral made to [facility] for RN, PT and ReDS [place] with [facility] accepted.  No further needs identified at this time.             Action/Plan:   Expected Discharge Date:                  Expected Discharge Plan:  [place]  In-House Referral:     Discharge planning Services  CM Consult  Post Acute Care Choice:    Choice offered to:  Patient  DME Arranged:    DME Agency:     HH Arranged:  RN, PT(ReDS Vest) [place]:  [facility] (now [facility])  Status of Service:  Completed, signed off  If discussed at [place] of Stay Meetings, dates discussed:    Additional Comments:  Elza Rafter, RN 11/05/2018, 1:44 PM

## 2018-11-05 NOTE — Progress Notes (Signed)
PHARMACY NOTE:  ANTIMICROBIAL RENAL DOSAGE ADJUSTMENT  Current antimicrobial regimen includes a mismatch between antimicrobial dosage and estimated renal function.  As per policy approved by the Pharmacy & Therapeutics and Medical Executive Committees, the antimicrobial dosage will be adjusted accordingly.  Current antimicrobial dosage:  Cefepime 2 g q12H   Indication: sepsis   Renal Function:  Estimated Creatinine Clearance: 44.7 mL/min (A) (by C-G formula based on SCr of 1.72 mg/dL (H)).     Antimicrobial dosage has been changed to:  Cefepime 2g q24H   Additional comments: CrCl < 50 changed to q24h dosing.    Thank you for allowing pharmacy to be a part of this patient's care.  Oswald Hillock, [facility] 11/05/2018 3:42 PM

## 2018-11-05 NOTE — Plan of Care (Signed)
  Problem: Clinical Measurements: Goal: Respiratory complications will improve Outcome: Progressing Note:  Receiving IV steroids , cough syrup & tessalon, SVN treatments    Problem: Pain Managment: Goal: General experience of comfort will improve Outcome: Progressing Note:  No complaints of pain this shift   Problem: Safety: Goal: Ability to remain free from injury will improve Outcome: Progressing   Problem: Cardiac: Goal: Ability to achieve and maintain adequate cardiopulmonary perfusion will improve Outcome: Progressing Note:  Receiving IV lasix    Problem: Education: Goal: Knowledge of General Education information will improve Description Including pain rating scale, medication(s)/side effects and non-pharmacologic comfort measures Outcome: Completed/Met

## 2018-11-05 NOTE — Progress Notes (Signed)
Physical Therapy Treatment Patient Details Name: Darius Taylor MRN: [id number] DOB: 04/19/1946 Today's Date: 11/05/2018    History of Present Illness From MD H&P: Pt is a 73 y.o. male with a known history of atrial fibrillation on eliquis, CAD, nonischemic cardiomyopathy with last known EF of 25% status post AICD and pacemaker, CKD, history of colonic adenocarcinoma status post partial colectomy, history of renal cell carcinoma status post left nephrectomy, COPD on as needed home oxygen presented to the hospital secondary to worsening shortness of breath. He stated he had a cough for almost a week.  His breathing started to slowly get worse.  His sats were noted to be in the 70s with EMS.  Patient was noted to have increased work of breathing and hypoxia initially and so placed on BiPAP in the ED.  Labs indicated an elevated troponin, elevated BNP and chest x-ray showed left midlung airspace disease concerning for pneumonia.  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     PT Comments    Pt with independent bed mobility skills.  Stood with supervision to walker and was able to ambulate around unit x 1 with walker and steady gait.    Pt on 4 lpm upon arrival.  Sats 94%. He was able to ambulate 100' today and maintain sats >90.  Upon return to room he had dropped to 84% on 4 lpm but quickly returned to baseline with deep breathing.  While he was aware of SOB he continued with gait.  Pt stated he was on 2lpm O2 at home but only when needed prior to admit.   Stated he did not use RW at home but has one.  Educated on safety and energy conservation with walker use to allow for increase mobility until strength and respiratory status improve.   Follow Up Recommendations  Home health PT     Equipment Recommendations  None recommended by PT    Recommendations for Other Services        Precautions / Restrictions Precautions Precautions: Fall Restrictions Weight Bearing Restrictions: No Other Position/Activity Restrictions: Watch O2 sats    Mobility  Bed Mobility Overal bed mobility: Modified Independent                Transfers Overall transfer level: Modified independent                  Ambulation/Gait Ambulation/Gait assistance: Supervision Gait Distance (Feet): 200 Feet Assistive device: Rolling walker (2 wheeled) Gait Pattern/deviations: Step-through pattern Gait velocity: decreased   General Gait Details: No LOB or buckling noted.  Generally safe but poor awareness of limitations in regards to SOB   Stairs             Wheelchair Mobility    Modified Rankin (Stroke Patients Only)       Balance Overall balance assessment: Mild deficits observed, not formally tested                                          Cognition Arousal/Alertness: Awake/alert Behavior During Therapy: WFL for tasks assessed/performed Overall Cognitive Status: Within Functional Limits for tasks assessed  Exercises      General Comments        Pertinent Vitals/Pain Pain Assessment: No/denies pain    Home Living                      Prior Function            PT Goals (current goals can now be found in the care plan section) Progress towards PT goals: Progressing toward goals    Frequency    Min 2X/week      PT Plan Current plan remains appropriate    Co-evaluation              AM-PAC PT "6 Clicks" Mobility   Outcome Measure  Help needed turning from your back to your side while in a flat bed without using bedrails?: None Help needed moving from lying on your back to sitting on the side of a flat bed without using bedrails?: None Help needed moving to and from a bed to a chair (including a wheelchair)?: None Help needed standing up from a  chair using your arms (e.g., wheelchair or bedside chair)?: None Help needed to walk in hospital room?: A Little Help needed climbing 3-5 steps with a railing? : A Little 6 Click Score: 22    End of Session Equipment Utilized During Treatment: Gait belt;Oxygen Activity Tolerance: Patient tolerated treatment well;Patient limited by fatigue Patient left: in chair;with call bell/phone within reach         Time: 0928-0945 PT Time Calculation (min) (ACUTE ONLY): 17 min  Charges:  $Gait Training: 8-22 mins                     Chesley Noon, PTA 11/05/18, 9:51 AM

## 2018-11-05 NOTE — Progress Notes (Signed)
[place] [facility] [place] NAME: Darius Taylor    MR#:  [id number]  DATE OF BIRTH:  Oct 15, 1945  SUBJECTIVE:   Sitting on chair in no apparent distress.  Overall feels better.  Still has a cough which is nonproductive.  No other acute events overnight.  REVIEW OF SYSTEMS:    Review of Systems  Constitutional: Negative for chills and fever.  HENT: Negative for congestion and tinnitus.   Eyes: Negative for blurred vision and double vision.  Respiratory: Positive for cough and shortness of breath. Negative for wheezing.   Cardiovascular: Negative for chest pain, orthopnea and PND.  Gastrointestinal: Negative for abdominal pain, diarrhea, nausea and vomiting.  Genitourinary: Negative for dysuria and hematuria.  Neurological: Negative for dizziness, sensory change and focal weakness.  All other systems reviewed and are negative.   Nutrition: Heart Healthy Tolerating Diet: Yes Tolerating PT: Await Eval.   DRUG ALLERGIES:  No Known Allergies  VITALS:  Blood pressure (!) 114/59, pulse 66, temperature 97.7 F (36.5 C), temperature source Oral, resp. rate 20, height 5\' 11"  (1.803 m), weight 96.8 kg, SpO2 96 %.  PHYSICAL EXAMINATION:   Physical Exam  GENERAL:  73 y.o.-year-old obese patient sitting in chair in mild Resp. Distress.   EYES: Pupils equal, round, reactive to light and accommodation. No scleral icterus. Extraocular muscles intact.  HEENT: Head atraumatic, normocephalic. Oropharynx and nasopharynx clear.  NECK:  Supple, no jugular venous distention. No thyroid enlargement, no tenderness.  LUNGS: Good air entry bilaterally, some wheezing/rhonchi b/l, no rales. negative use of accessory muscles. CARDIOVASCULAR: S1, S2 normal. No murmurs, rubs, or gallops.  ABDOMEN: Soft, nontender, nondistended. Bowel sounds present. No organomegaly or mass.  EXTREMITIES: No cyanosis, clubbing, + 1 edema b/l.   NEUROLOGIC: Cranial nerves II through  XII are intact. No focal Motor or sensory deficits b/l. Globally weak.   PSYCHIATRIC: The patient is alert and oriented x 3.  SKIN: No obvious rash, lesion, or ulcer.    LABORATORY PANEL:   CBC Recent Labs  Lab 11/05/18 0444  WBC 14.5*  HGB 10.3*  HCT 33.1*  PLT 216   ------------------------------------------------------------------------------------------------------------------  Chemistries  Recent Labs  Lab 11/05/18 0444  NA 136  K 4.8  CL 100  CO2 29  GLUCOSE 174*  BUN 55*  CREATININE 1.72*  CALCIUM 8.7*   ------------------------------------------------------------------------------------------------------------------  Cardiac Enzymes Recent Labs  Lab 11/04/18 0818  TROPONINI 0.15*   ------------------------------------------------------------------------------------------------------------------  RADIOLOGY:  Dg Chest Port 1 View  Result Date: 11/05/2018 CLINICAL DATA:  Shortness of breath EXAM: PORTABLE CHEST 1 VIEW COMPARISON:  11/03/2018 and prior radiographs FINDINGS: Cardiomegaly and LEFT pacemaker/ICD again noted. Pulmonary vascular congestion is again noted. Patchy opacities overlying the mid and LOWER LEFT lung are again identified. No pleural effusion or pneumothorax. IMPRESSION: Unchanged appearance of the chest with patchy opacities overlying the mid and LOWER LEFT lung which may represent airspace disease/pneumonia. Cardiomegaly and mild pulmonary vascular congestion. Electronically Signed   By: Margarette Canada M.D.   On: 11/05/2018 12:26   Dg Chest Portable 1 View  Result Date: 11/03/2018 CLINICAL DATA:  Increase shortness-of-breath. EXAM: PORTABLE CHEST 1 VIEW COMPARISON:  08/30/2018 FINDINGS: Left-sided pacemaker unchanged. Lordotic technique demonstrated. Lungs are adequately inflated demonstrate persistent hazy airspace opacification over the left midlung which may be slightly worse and may represent ongoing infection. No definite effusion. Stable  moderate cardiomegaly. Remainder the exam is unchanged. IMPRESSION: Interval worsening hazy airspace process over the left midlung  likely infection. Stable cardiomegaly. Electronically Signed   By: Marin Olp M.D.   On: 11/03/2018 16:27     ASSESSMENT AND PLAN:   Darius Taylor  is a 73 y.o. male with a known history of atrial fibrillation on Coumadin, CAD, nonischemic cardiomyopathy with last known EF of 25% status post AICD and pacemaker, history of colonic adenocarcinoma status post partial colectomy, history of renal cell carcinoma status post left nephrectomy, COPD on as needed home oxygen presents to hospital secondary to worsening shortness of breath.  1.  Acute hypoxic respiratory failure-secondary to pneumonia, left lower lobe infiltrate and noted on chest x-ray - Continue broad-spectrum IV antibiotics with vancomycin, cefepime.  MRSA PCR is positive. -Procalcitonin level was normal.  Will get sputum culture.  Continue IV steroids and wheezing bronchospasm slightly improved since yesterday.  Will consider switching to oral prednisone tomorrow if improving.  2.  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  Follows with [facility] cardiology. Will DC IV Lasix and switch to oral Lasix.  Continue Toprol, Aldactone.  Follow I's and O's and daily weights.  3.  Hypertension- cont. Norvasc, Toprol  4.  Atrial fibrillation-status post AICD and pacemaker due to history of V. tach. -Rate controlled.  On Toprol -On Eliquis for anticoagulation.  cont. Amiodarone and stable.   5.  CKD-stable.  Baseline creatinine seems to be around 1.7 and currently at baseline   6.  Glaucoma-continue latanoprost eyedrops.  Will get PT eval.    All the records are reviewed and case discussed with Care Management/Social Worker. Management plans discussed with the patient, family and they are in agreement.  CODE STATUS: Full code  DVT Prophylaxis: Eliquis  TOTAL TIME TAKING  CARE OF THIS PATIENT: 30 minutes.   POSSIBLE D/C IN 1-2 DAYS, DEPENDING ON CLINICAL CONDITION.   Henreitta Leber M.D on 11/05/2018 at 2:32 PM  Between 7am to 6pm - Pager - [phone]  After 6pm go to [email or web address] - [employer] [facility]  Office  [phone]  CC: Primary care physician; [employer]

## 2018-11-06 LAB — BASIC METABOLIC PANEL
ANION GAP: 7 (ref 5–15)
BUN: 72 mg/dL — ABNORMAL HIGH (ref 8–23)
CO2: 29 mmol/L (ref 22–32)
Calcium: 8.8 mg/dL — ABNORMAL LOW (ref 8.9–10.3)
Chloride: 99 mmol/L (ref 98–111)
Creatinine, Ser: 1.92 mg/dL — ABNORMAL HIGH (ref 0.61–1.24)
GFR calc Af Amer: 39 mL/min — ABNORMAL LOW (ref >60)
GFR calc non Af Amer: 34 mL/min — ABNORMAL LOW (ref >60)
Glucose, Bld: 185 mg/dL — ABNORMAL HIGH (ref 70–99)
Potassium: 5.1 mmol/L (ref 3.5–5.1)
Sodium: 135 mmol/L (ref 135–145)

## 2018-11-06 LAB — CBC
HCT: 33.7 % — ABNORMAL LOW (ref 39.0–52.0)
Hemoglobin: 10.6 g/dL — ABNORMAL LOW (ref 13.0–17.0)
MCH: 30.5 pg (ref 26.0–34.0)
MCHC: 31.5 g/dL (ref 30.0–36.0)
MCV: 97.1 fL (ref 80.0–100.0)
NRBC: 0.1 % (ref 0.0–0.2)
Platelets: 233 10*3/uL (ref 150–400)
RBC: 3.47 MIL/uL — ABNORMAL LOW (ref 4.22–5.81)
RDW: 15 % (ref 11.5–15.5)
WBC: 16.3 10*3/uL — ABNORMAL HIGH (ref 4.0–10.5)

## 2018-11-06 MED ORDER — IPRATROPIUM-ALBUTEROL 0.5-2.5 (3) MG/3ML IN SOLN
3.0000 mL | Freq: Four times a day (QID) | RESPIRATORY_TRACT | Status: DC
  Administered 2018-11-06 – 2018-11-12 (×25): 3 mL via RESPIRATORY_TRACT
  Filled 2018-11-06 (×25): qty 3

## 2018-11-06 MED ORDER — METHYLPREDNISOLONE SODIUM SUCC 125 MG IJ SOLR
60.0000 mg | Freq: Two times a day (BID) | INTRAMUSCULAR | Status: DC
  Administered 2018-11-06 – 2018-11-11 (×11): 60 mg via INTRAVENOUS
  Filled 2018-11-06 (×11): qty 2

## 2018-11-06 MED ORDER — BUDESONIDE 0.5 MG/2ML IN SUSP
0.5000 mg | Freq: Two times a day (BID) | RESPIRATORY_TRACT | Status: DC
  Administered 2018-11-06 – 2018-11-12 (×12): 0.5 mg via RESPIRATORY_TRACT
  Filled 2018-11-06 (×12): qty 2

## 2018-11-06 NOTE — Plan of Care (Signed)
  Problem: Clinical Measurements: Goal: Will remain free from infection Outcome: Progressing Note:  Receiving IV abx still, and IV steroids   Problem: Pain Managment: Goal: General experience of comfort will improve Outcome: Progressing Note:  No complaints of pain this shift   Problem: Safety: Goal: Ability to remain free from injury will improve Outcome: Progressing   Problem: Cardiac: Goal: Ability to achieve and maintain adequate cardiopulmonary perfusion will improve Outcome: Progressing Note:  Switched to PO lasix

## 2018-11-06 NOTE — Progress Notes (Signed)
[place] [facility] [place] NAME: Darius Taylor    MR#:  [id number]  DATE OF BIRTH:  1946-06-20  SUBJECTIVE:   Still having some wheezing, bronchospasm today.  Cough is productive.  Patient has been able to give [place] a sputum sample  REVIEW OF SYSTEMS:    Review of Systems  Constitutional: Negative for chills and fever.  HENT: Negative for congestion and tinnitus.   Eyes: Negative for blurred vision and double vision.  Respiratory: Positive for cough, shortness of breath and wheezing.   Cardiovascular: Negative for chest pain, orthopnea and PND.  Gastrointestinal: Negative for abdominal pain, diarrhea, nausea and vomiting.  Genitourinary: Negative for dysuria and hematuria.  Neurological: Negative for dizziness, sensory change and focal weakness.  All other systems reviewed and are negative.   Nutrition: Heart Healthy Tolerating Diet: Yes Tolerating PT: Await Eval.   DRUG ALLERGIES:  No Known Allergies  VITALS:  Blood pressure 114/78, pulse 63, temperature 97.6 F (36.4 C), temperature source Oral, resp. rate 20, height 5\' 11"  (1.803 m), weight 96.6 kg, SpO2 94 %.  PHYSICAL EXAMINATION:   Physical Exam  GENERAL:  73 y.o.-year-old obese patient sitting in chair in mild Resp. Distress.   EYES: Pupils equal, round, reactive to light and accommodation. No scleral icterus. Extraocular muscles intact.  HEENT: Head atraumatic, normocephalic. Oropharynx and nasopharynx clear.  NECK:  Supple, no jugular venous distention. No thyroid enlargement, no tenderness.  LUNGS: Good air entry bilaterally, diffuse wheezing/rhonchi b/l, no rales. negative use of accessory muscles. CARDIOVASCULAR: S1, S2 normal. No murmurs, rubs, or gallops.  ABDOMEN: Soft, nontender, nondistended. Bowel sounds present. No organomegaly or mass.  EXTREMITIES: No cyanosis, clubbing, + 1 edema b/l.   NEUROLOGIC: Cranial nerves II through XII are intact. No focal Motor or  sensory deficits b/l. Globally weak.   PSYCHIATRIC: The patient is alert and oriented x 3.  SKIN: No obvious rash, lesion, or ulcer.    LABORATORY PANEL:   CBC Recent Labs  Lab 11/06/18 0513  WBC 16.3*  HGB 10.6*  HCT 33.7*  PLT 233   ------------------------------------------------------------------------------------------------------------------  Chemistries  Recent Labs  Lab 11/06/18 0513  NA 135  K 5.1  CL 99  CO2 29  GLUCOSE 185*  BUN 72*  CREATININE 1.92*  CALCIUM 8.8*   ------------------------------------------------------------------------------------------------------------------  Cardiac Enzymes Recent Labs  Lab 11/04/18 0818  TROPONINI 0.15*   ------------------------------------------------------------------------------------------------------------------  RADIOLOGY:  Dg Chest Port 1 View  Result Date: 11/05/2018 CLINICAL DATA:  Shortness of breath EXAM: PORTABLE CHEST 1 VIEW COMPARISON:  11/03/2018 and prior radiographs FINDINGS: Cardiomegaly and LEFT pacemaker/ICD again noted. Pulmonary vascular congestion is again noted. Patchy opacities overlying the mid and LOWER LEFT lung are again identified. No pleural effusion or pneumothorax. IMPRESSION: Unchanged appearance of the chest with patchy opacities overlying the mid and LOWER LEFT lung which may represent airspace disease/pneumonia. Cardiomegaly and mild pulmonary vascular congestion. Electronically Signed   By: Margarette Canada M.D.   On: 11/05/2018 12:26     ASSESSMENT AND PLAN:   Darius Taylor  is a 73 y.o. male with a known history of atrial fibrillation on Coumadin, CAD, nonischemic cardiomyopathy with last known EF of 25% status post AICD and pacemaker, history of colonic adenocarcinoma status post partial colectomy, history of renal cell carcinoma status post left nephrectomy, COPD on as needed home oxygen presents to hospital secondary to worsening shortness of breath.  1.  Acute hypoxic  respiratory failure-secondary to pneumonia, left  lower lobe infiltrate and noted on chest x-ray - Continue broad-spectrum IV antibiotics with vancomycin, cefepime.  MRSA PCR is positive. -Patient's wheezing bronchospasm got worse yesterday.  We will continue steroids but will advance dose, continue scheduled duo nebs, will add Pulmicort nebs.  Await sputum culture. -Procalcitonin level was normal.  Wean oxygen as tolerated.  2.  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  Follows with [facility] cardiology. Continue oral Lasix, Toprol, Aldactone.  Follow I's and O's and daily weights.  Creatinine slightly trending upwards and will continue to monitor.  3.  Hypertension- cont. Norvasc, Toprol  4.  Atrial fibrillation-status post AICD and pacemaker due to history of V. tach. -Rate controlled.  On Toprol -On Eliquis for anticoagulation.  cont. Amiodarone and stable.   5.  CKD-stable.  Baseline creatinine seems to be around 1.7 and slightly increased today and will cont. To monitor.    6.  Glaucoma-continue latanoprost eyedrops.  Await PT eval.   All the records are reviewed and case discussed with Care Management/Social Worker. Management plans discussed with the patient, family and they are in agreement.  CODE STATUS: Full code  DVT Prophylaxis: Eliquis  TOTAL TIME TAKING CARE OF THIS PATIENT: 30 minutes.   POSSIBLE D/C IN 1-2 DAYS, DEPENDING ON CLINICAL CONDITION.   Henreitta Leber M.D on 11/06/2018 at 3:10 PM  Between 7am to 6pm - Pager - [phone]  After 6pm go to [email or web address] - [employer] [facility]  Office  [phone]  CC: Primary care physician; [employer]

## 2018-11-06 NOTE — Progress Notes (Signed)
Pharmacy Antibiotic Note  Darius Taylor is a 73 y.o. male admitted on 11/03/2018 with sepsis.  Pharmacy has been consulted for Cefepime, Vancomycin dosing.  Plan: .Cefepime 2 gm IV Q24H - adjusted per renal function    Vancomycin 1 gm IV X 1 given in ED on 2/10 @ 1736 Vancomycin 1 gm IV X 1 ordered to be given following initial dose to make total loading dose of 2 gm. A maintenance dose of vancomycin 1250 mg IV Q24H ordered to start 2/11 @ 1800. Will order levels peak and trough levels for tonight's dose. Scr slightly trending up.   CrCl = 38 ml/min Vd = 69.4 ml/min Ke = 0.036 hr-1 T1/2 = 19.1 hrs  Calc AUC = 497  Css max = 31 Css min = 13.7   No peak and trough currently ordered.   Height: 5\' 11"  (180.3 cm) Weight: 213 lb (96.6 kg) IBW/kg (Calculated) : 75.3  Temp (24hrs), Avg:97.7 F (36.5 C), Min:97.6 F (36.4 C), Max:97.9 F (36.6 C)  Recent Labs  Lab 11/03/18 1611 11/04/18 0818 11/05/18 0444 11/06/18 0513  WBC 8.2 9.5 14.5* 16.3*  CREATININE 1.80* 1.69* 1.72* 1.92*    Estimated Creatinine Clearance: 40 mL/min (A) (by C-G formula based on SCr of 1.92 mg/dL (H)).    No Known Allergies  Antimicrobials this admission:   2/11 cefepime >>  2/11vancomycin  >>   Dose adjustments this admission: Frequency of cefepime adjusted.   Microbiology results:  2/10 BCx: NGTD 2/12 Sputum: pending   MRSA PCR: positive   Thank you for allowing pharmacy to be a part of this patient's care.  Oswald Hillock, PharmD, BCPS Clinical pharmacist  11/06/2018 12:06 PM

## 2018-11-06 NOTE — Care Management (Addendum)
Barrier to discharge; continues on IV vancomycin, cefepime

## 2018-11-07 LAB — BASIC METABOLIC PANEL
Anion gap: 7 (ref 5–15)
BUN: 74 mg/dL — ABNORMAL HIGH (ref 8–23)
CHLORIDE: 101 mmol/L (ref 98–111)
CO2: 29 mmol/L (ref 22–32)
Calcium: 8.8 mg/dL — ABNORMAL LOW (ref 8.9–10.3)
Creatinine, Ser: 1.9 mg/dL — ABNORMAL HIGH (ref 0.61–1.24)
GFR calc Af Amer: 39 mL/min — ABNORMAL LOW (ref >60)
GFR calc non Af Amer: 34 mL/min — ABNORMAL LOW (ref >60)
Glucose, Bld: 202 mg/dL — ABNORMAL HIGH (ref 70–99)
Potassium: 4.7 mmol/L (ref 3.5–5.1)
Sodium: 137 mmol/L (ref 135–145)

## 2018-11-07 LAB — CBC
HCT: 33.4 % — ABNORMAL LOW (ref 39.0–52.0)
Hemoglobin: 10.3 g/dL — ABNORMAL LOW (ref 13.0–17.0)
MCH: 30.2 pg (ref 26.0–34.0)
MCHC: 30.8 g/dL (ref 30.0–36.0)
MCV: 97.9 fL (ref 80.0–100.0)
Platelets: 247 10*3/uL (ref 150–400)
RBC: 3.41 MIL/uL — ABNORMAL LOW (ref 4.22–5.81)
RDW: 15.1 % (ref 11.5–15.5)
WBC: 15.9 10*3/uL — ABNORMAL HIGH (ref 4.0–10.5)
nRBC: 0.2 % (ref 0.0–0.2)

## 2018-11-07 LAB — GLUCOSE, CAPILLARY
GLUCOSE-CAPILLARY: 160 mg/dL — AB (ref 70–99)
Glucose-Capillary: 237 mg/dL — ABNORMAL HIGH (ref 70–99)

## 2018-11-07 LAB — VANCOMYCIN, PEAK: Vancomycin Pk: 37 ug/mL (ref 30–40)

## 2018-11-07 MED ORDER — SPIRONOLACTONE 25 MG PO TABS
12.5000 mg | ORAL_TABLET | Freq: Every day | ORAL | Status: DC
  Administered 2018-11-08 – 2018-11-12 (×5): 12.5 mg via ORAL
  Filled 2018-11-07 (×2): qty 1
  Filled 2018-11-07 (×3): qty 0.5
  Filled 2018-11-07 (×2): qty 1
  Filled 2018-11-07 (×2): qty 0.5

## 2018-11-07 MED ORDER — INSULIN ASPART 100 UNIT/ML ~~LOC~~ SOLN
0.0000 [IU] | Freq: Three times a day (TID) | SUBCUTANEOUS | Status: DC
  Administered 2018-11-07: 3 [IU] via SUBCUTANEOUS
  Administered 2018-11-08: 2 [IU] via SUBCUTANEOUS
  Administered 2018-11-08: 3 [IU] via SUBCUTANEOUS
  Administered 2018-11-08: 2 [IU] via SUBCUTANEOUS
  Filled 2018-11-07 (×5): qty 1

## 2018-11-07 NOTE — Care Management Important Message (Signed)
Important Message  Patient Details  Name: YAHYA BOLDMAN MRN: [id number] Date of Birth: December 01, 1945   Medicare Important Message Given:  Yes    Elza Rafter, RN 11/07/2018, 9:54 AM

## 2018-11-07 NOTE — Progress Notes (Addendum)
Pharmacy Antibiotic Note  Darius Taylor is a 73 y.o. male admitted on 11/03/2018 with sepsis.  Pharmacy has been consulted for Cefepime dosing. Day 5/7 of abx.   Plan: Cefepime 2 gm IV Q24H - adjusted per renal function    Vancomycin -d/c'ed     Height: 5\' 11"  (180.3 cm) Weight: 216 lb 4.3 oz (98.1 kg) IBW/kg (Calculated) : 75.3  Temp (24hrs), Avg:97.5 F (36.4 C), Min:97.3 F (36.3 C), Max:97.7 F (36.5 C)  Recent Labs  Lab 11/03/18 1611 11/04/18 0818 11/05/18 0444 11/06/18 0513 11/06/18 2122 11/07/18 0453  WBC 8.2 9.5 14.5* 16.3*  --  15.9*  CREATININE 1.80* 1.69* 1.72* 1.92*  --  1.90*  VANCOPEAK  --   --   --   --  37  --     Estimated Creatinine Clearance: 40.7 mL/min (A) (by C-G formula based on SCr of 1.9 mg/dL (H)).    No Known Allergies  Antimicrobials this admission:   2/11 cefepime >>  2/11vancomycin  >> 2/14  Dose adjustments this admission: Frequency of cefepime adjusted.   Microbiology results:  2/10 BCx: NGTD 2/12 Sputum: RARE GRAM POSITIVE COCCI, RARE GRAM NEGATIVE RODS  MRSA PCR: positive   Thank you for allowing pharmacy to be a part of this patient's care.  Oswald Hillock, PharmD, BCPS Clinical pharmacist  11/07/2018 1:06 PM

## 2018-11-07 NOTE — Progress Notes (Signed)
[facility] [place] NAME: Darius Taylor    MR#:  [id number]  DATE OF BIRTH:  06-11-1946  SUBJECTIVE:   Still having some wheezing and cough.  Feels better since admission.  No other acute complaints or events overnight.  REVIEW OF SYSTEMS:    Review of Systems  Constitutional: Negative for chills and fever.  HENT: Negative for congestion and tinnitus.   Eyes: Negative for blurred vision and double vision.  Respiratory: Positive for cough, shortness of breath and wheezing.   Cardiovascular: Negative for chest pain, orthopnea and PND.  Gastrointestinal: Negative for abdominal pain, diarrhea, nausea and vomiting.  Genitourinary: Negative for dysuria and hematuria.  Neurological: Negative for dizziness, sensory change and focal weakness.  All other systems reviewed and are negative.   Nutrition: Heart Healthy Tolerating Diet: Yes Tolerating PT:  Eval noted.   DRUG ALLERGIES:  No Known Allergies  VITALS:  Blood pressure 119/72, pulse 60, temperature (!) 97.3 F (36.3 C), temperature source Oral, resp. rate 19, height 5\' 11"  (1.803 m), weight 98.1 kg, SpO2 99 %.  PHYSICAL EXAMINATION:   Physical Exam  GENERAL:  73 y.o.-year-old obese patient sitting in chair in mild Resp. Distress.   EYES: Pupils equal, round, reactive to light and accommodation. No scleral icterus. Extraocular muscles intact.  HEENT: Head atraumatic, normocephalic. Oropharynx and nasopharynx clear.  NECK:  Supple, no jugular venous distention. No thyroid enlargement, no tenderness.  LUNGS: Good air entry bilaterally, diffuse wheezing/rhonchi b/l, no rales. negative use of accessory muscles. CARDIOVASCULAR: S1, S2 normal. No murmurs, rubs, or gallops.  ABDOMEN: Soft, nontender, nondistended. Bowel sounds present. No organomegaly or mass.  EXTREMITIES: No cyanosis, clubbing, + 1 edema b/l.   NEUROLOGIC: Cranial nerves II through XII are intact. No focal Motor or  sensory deficits b/l.   PSYCHIATRIC: The patient is alert and oriented x 3.  SKIN: No obvious rash, lesion, or ulcer.    LABORATORY PANEL:   CBC Recent Labs  Lab 11/07/18 0453  WBC 15.9*  HGB 10.3*  HCT 33.4*  PLT 247   ------------------------------------------------------------------------------------------------------------------  Chemistries  Recent Labs  Lab 11/07/18 0453  NA 137  K 4.7  CL 101  CO2 29  GLUCOSE 202*  BUN 74*  CREATININE 1.90*  CALCIUM 8.8*   ------------------------------------------------------------------------------------------------------------------  Cardiac Enzymes Recent Labs  Lab 11/04/18 0818  TROPONINI 0.15*   ------------------------------------------------------------------------------------------------------------------  RADIOLOGY:  No results found.   ASSESSMENT AND PLAN:   Darius Taylor  is a 73 y.o. male with a known history of atrial fibrillation on Coumadin, CAD, nonischemic cardiomyopathy with last known EF of 25% status post AICD and pacemaker, history of colonic adenocarcinoma status post partial colectomy, history of renal cell carcinoma status post left nephrectomy, COPD on as needed home oxygen presents to hospital secondary to worsening shortness of breath.  1.  Acute hypoxic respiratory failure-secondary to pneumonia, left lower lobe infiltrate and noted on chest x-ray -Will DC vancomycin, continue cefepime. - still having some wheezing/bronchospasm. continue steroids, continue scheduled duo nebs, Pulmicort nebs.  Await sputum culture. -Procalcitonin level was normal.  Wean oxygen as tolerated.  2.  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  Follows with [facility] cardiology. - Continue oral Lasix, Toprol, Aldactone. Clinically not in CHF.   3.  Hypertension- cont. Norvasc, Toprol  4.  Atrial fibrillation-status post AICD and pacemaker due to history of V. tach. -Rate controlled.   cont. Toprol, Eliquis.  -  cont. Amiodarone   5.  CKD-stable.  Baseline creatinine seems to be around 1.7 and slightly increased and will cont. To monitor.   6.  Glaucoma-continue latanoprost eyedrops.  PT recommending home health services and will arrange prior to discharge.  All the records are reviewed and case discussed with Care Management/Social Worker. Management plans discussed with the patient, family and they are in agreement.  CODE STATUS: Full code  DVT Prophylaxis: Eliquis  TOTAL TIME TAKING CARE OF THIS PATIENT: 30 minutes.   POSSIBLE D/C IN 2-3 DAYS, DEPENDING ON CLINICAL CONDITION.   Henreitta Leber M.D on 11/07/2018 at 2:27 PM  Between 7am to 6pm - Pager - [phone]  After 6pm go to [email or web address] - [employer] [place] [facility]  Office  [phone]  CC: Primary care physician; [employer]

## 2018-11-07 NOTE — Progress Notes (Signed)
Physical Therapy Treatment Patient Details Name: Darius Taylor MRN: [id number] DOB: [birth year] Today's Date: 11/07/2018    History of Present Illness From MD H&P: Pt is a 73 y.o. male with a known history of atrial fibrillation on eliquis, CAD, nonischemic cardiomyopathy with last known EF of 25% status post AICD and pacemaker, CKD, history of colonic adenocarcinoma status post partial colectomy, history of renal cell carcinoma status post left nephrectomy, COPD on as needed home oxygen presented to the hospital secondary to worsening shortness of breath. He stated he had a cough for almost a week.  His breathing started to slowly get worse.  His sats were noted to be in the 70s with EMS.  Patient was noted to have increased work of breathing and hypoxia initially and so placed on BiPAP in the ED.  Labs indicated an elevated troponin, elevated BNP and chest x-ray showed left midlung airspace disease concerning for pneumonia.  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     PT Comments    Pt ambulated around nursing loop with RW; SOB noted with increased distance ambulating.  Pt's O2 sats 92% at rest on 3 L O2 via nasal cannula; O2 (on 4 L O2 via nasal cannula) decreased to 87% post ambulation but improved to 92% within a minute of sitting rest (with vc's for pursed lip breathing) and then pt placed back on 3 L O2 with O2 sats 95% at rest end of session.  Will continue to progress pt with strengthening, balance, and progressive functional mobility (while monitoring O2 sats) per pt tolerance.    Follow Up Recommendations  Home health PT     Equipment Recommendations  Rolling walker with 5" wheels    Recommendations for Other Services       Precautions / Restrictions Precautions Precautions: Fall Restrictions Weight Bearing Restrictions: No    Mobility  Bed Mobility Overal bed  mobility: Modified Independent             General bed mobility comments: Semi-supine to sit without any noted difficulties; HOB elevated.  Transfers Overall transfer level: Needs assistance Equipment used: None Transfers: Sit to/from Stand Sit to Stand: Min guard         General transfer comment: good strength with standing and sitting  Ambulation/Gait Ambulation/Gait assistance: Supervision Gait Distance (Feet): 200 Feet Assistive device: Rolling walker (2 wheeled) Gait Pattern/deviations: Step-through pattern Gait velocity: decreased   General Gait Details: steady with RW; SOB noted with increased distance ambulating   Stairs             Wheelchair Mobility    Modified Rankin (Stroke Patients Only)       Balance Overall balance assessment: Needs assistance Sitting-balance support: No upper extremity supported;Feet supported Sitting balance-Leahy Scale: Normal Sitting balance - Comments: steady sitting reaching outside BOS   Standing balance support: No upper extremity supported Standing balance-Leahy Scale: Good Standing balance comment: steady standing reaching within BOS                            Cognition Arousal/Alertness: Awake/alert Behavior During Therapy: WFL for tasks assessed/performed Overall Cognitive Status: Within Functional Limits for tasks assessed  Exercises      General Comments  Pt agreeable to PT session.      Pertinent Vitals/Pain Pain Assessment: No/denies pain  HR WFL during session.    Home Living                      Prior Function            PT Goals (current goals can now be found in the care plan section) Acute Rehab PT Goals Patient Stated Goal: Improved endurance PT Goal Formulation: With patient Time For Goal Achievement: 11/17/18 Potential to Achieve Goals: Good Progress towards PT goals: Progressing toward goals     Frequency    Min 2X/week      PT Plan Current plan remains appropriate    Co-evaluation              AM-PAC PT "6 Clicks" Mobility   Outcome Measure  Help needed turning from your back to your side while in a flat bed without using bedrails?: None Help needed moving from lying on your back to sitting on the side of a flat bed without using bedrails?: None Help needed moving to and from a bed to a chair (including a wheelchair)?: None Help needed standing up from a chair using your arms (e.g., wheelchair or bedside chair)?: None Help needed to walk in hospital room?: A Little Help needed climbing 3-5 steps with a railing? : A Little 6 Click Score: 22    End of Session Equipment Utilized During Treatment: Gait belt;Oxygen Activity Tolerance: Patient tolerated treatment well Patient left: in bed;with call bell/phone within reach(pt declined bed alarm; nurse notified and cleared pt to not have bed alarm on (pt did not have bed alarm on when PT entered room)) Nurse Communication: Mobility status;Other (comment)(Pt's O2 sats during session) PT Visit Diagnosis: Muscle weakness (generalized) (M62.81);Difficulty in walking, not elsewhere classified (R26.2)     Time: 8546-2703 PT Time Calculation (min) (ACUTE ONLY): 24 min  Charges:  $Therapeutic Exercise: 8-22 mins $Therapeutic Activity: 8-22 mins                     Leitha Bleak, PT 11/07/18, 4:54 PM [phone]

## 2018-11-08 LAB — BASIC METABOLIC PANEL
Anion gap: 5 (ref 5–15)
BUN: 75 mg/dL — ABNORMAL HIGH (ref 8–23)
CO2: 32 mmol/L (ref 22–32)
CREATININE: 1.73 mg/dL — AB (ref 0.61–1.24)
Calcium: 8.9 mg/dL (ref 8.9–10.3)
Chloride: 100 mmol/L (ref 98–111)
GFR calc Af Amer: 44 mL/min — ABNORMAL LOW (ref >60)
GFR, EST NON AFRICAN AMERICAN: 38 mL/min — AB (ref >60)
Glucose, Bld: 203 mg/dL — ABNORMAL HIGH (ref 70–99)
Potassium: 4.7 mmol/L (ref 3.5–5.1)
Sodium: 137 mmol/L (ref 135–145)

## 2018-11-08 LAB — CULTURE, BLOOD (ROUTINE X 2)
Culture: NO GROWTH
Culture: NO GROWTH
Special Requests: ADEQUATE

## 2018-11-08 LAB — GLUCOSE, CAPILLARY
Glucose-Capillary: 197 mg/dL — ABNORMAL HIGH (ref 70–99)
Glucose-Capillary: 193 mg/dL — ABNORMAL HIGH (ref 70–99)
Glucose-Capillary: 226 mg/dL — ABNORMAL HIGH (ref 70–99)
Glucose-Capillary: 212 mg/dL — ABNORMAL HIGH (ref 70–99)

## 2018-11-08 LAB — CULTURE, RESPIRATORY W GRAM STAIN
Culture: NORMAL
Gram Stain: NONE SEEN
Special Requests: NORMAL

## 2018-11-08 MED ORDER — INSULIN ASPART 100 UNIT/ML ~~LOC~~ SOLN: 0.0000 [IU] | Freq: Every day | SUBCUTANEOUS | Status: DC

## 2018-11-08 MED ORDER — POLYETHYLENE GLYCOL 3350 17 G PO PACK
17.0000 g | PACK | Freq: Every day | ORAL | Status: DC
  Administered 2018-11-08 – 2018-11-12 (×5): 17 g via ORAL
  Filled 2018-11-08 (×4): qty 1

## 2018-11-08 MED ORDER — INSULIN ASPART 100 UNIT/ML ~~LOC~~ SOLN: 0.0000 [IU] | Freq: Three times a day (TID) | SUBCUTANEOUS | Status: DC

## 2018-11-08 MED ORDER — DOXYCYCLINE HYCLATE 100 MG PO TABS
100.0000 mg | ORAL_TABLET | Freq: Two times a day (BID) | ORAL | Status: DC
  Administered 2018-11-08 – 2018-11-12 (×9): 100 mg via ORAL
  Filled 2018-11-08 (×9): qty 1

## 2018-11-08 NOTE — Progress Notes (Signed)
Patient ID: Darius Taylor, male   DOB: 17-Aug-1946, 73 y.o.   MRN: [id number]  [employer] PROGRESS NOTE  Darius Taylor VZC:[id number] DOB: 1945-10-09 DOA: 11/03/2018 PCP: [employer]  HPI/Subjective: Patient still not feeling well.  Some cough and shortness of breath.  He states he only wears oxygen as needed at home.  Objective: Vitals:   11/08/18 0631 11/08/18 0849  BP: 125/79 (!) 147/60  Pulse: 62 61  Resp: 20 20  Temp: 98.1 F (36.7 C) (!) 97.4 F (36.3 C)  SpO2: 96% 96%    Filed Weights   11/06/18 0306 11/07/18 0523 11/08/18 0631  Weight: 96.6 kg 98.1 kg 96.6 kg    ROS: Review of Systems  Constitutional: Negative for chills and fever.  Eyes: Negative for blurred vision.  Respiratory: Positive for cough and shortness of breath.   Cardiovascular: Negative for chest pain.  Gastrointestinal: Negative for abdominal pain, constipation, diarrhea, nausea and vomiting.  Genitourinary: Negative for dysuria.  Musculoskeletal: Negative for joint pain.  Neurological: Negative for dizziness and headaches.   Exam: Physical Exam  Constitutional: He is oriented to person, place, and time.  HENT:  Nose: No mucosal edema.  Mouth/Throat: No oropharyngeal exudate or posterior oropharyngeal edema.  Eyes: Pupils are equal, round, and reactive to light. Conjunctivae, EOM and lids are normal.  Neck: No JVD present. Carotid bruit is not present. No edema present. No thyroid mass and no thyromegaly present.  Cardiovascular: S1 normal and S2 normal. Exam reveals no gallop.  No murmur heard. Pulses:      Dorsalis pedis pulses are 2+ on the right side and 2+ on the left side.  Respiratory: No respiratory distress. He has decreased breath sounds in the right middle field, the right lower field, the left middle field and the left lower field. He has wheezes in the right middle field. He has rhonchi in the right lower field and the left lower field. He has no rales.  GI:  Soft. Bowel sounds are normal. There is no abdominal tenderness.  Musculoskeletal:     Right ankle: He exhibits swelling.     Left ankle: He exhibits swelling.  Lymphadenopathy:    He has no cervical adenopathy.  Neurological: He is alert and oriented to person, place, and time. No cranial nerve deficit.  Skin: Skin is warm. No rash noted. Nails show no clubbing.  Psychiatric: He has a normal mood and affect.      Data Reviewed: Basic Metabolic Panel: Recent Labs  Lab 11/04/18 0818 11/05/18 0444 11/06/18 0513 11/07/18 0453 11/08/18 0439  NA 137 136 135 137 137  K 4.2 4.8 5.1 4.7 4.7  CL 101 100 99 101 100  CO2 28 29 29 29  32  GLUCOSE 165* 174* 185* 202* 203*  BUN 41* 55* 72* 74* 75*  CREATININE 1.69* 1.72* 1.92* 1.90* 1.73*  CALCIUM 8.6* 8.7* 8.8* 8.8* 8.9   CBC: Recent Labs  Lab 11/03/18 1611 11/04/18 0818 11/05/18 0444 11/06/18 0513 11/07/18 0453  WBC 8.2 9.5 14.5* 16.3* 15.9*  NEUTROABS 5.9  --   --   --   --   HGB 11.3* 10.6* 10.3* 10.6* 10.3*  HCT 36.3* 33.4* 33.1* 33.7* 33.4*  MCV 97.3 94.6 95.4 97.1 97.9  PLT 209 207 216 233 247   Cardiac Enzymes: Recent Labs  Lab 11/03/18 1611 11/03/18 2212 11/04/18 0225 11/04/18 0818  TROPONINI 0.16* 0.17* 0.19* 0.15*   BNP (last 3 results) Recent Labs    08/22/18  1125 08/30/18 0830 11/03/18 1611  BNP 392.0* 310.0* 428.0*     CBG: Recent Labs  Lab 11/07/18 1733 11/07/18 2112 11/08/18 0811 11/08/18 1138  GLUCAP 237* 160* 197* 193*    Recent Results (from the past 240 hour(s))  Blood culture (routine x 2)     Status: None   Collection Time: 11/03/18  4:37 PM  Result Value Ref Range Status   Specimen Description BLOOD RIGHT ANTECUBITAL  Final   Special Requests   Final    BOTTLES DRAWN AEROBIC AND ANAEROBIC Blood Culture results may not be optimal due to an excessive volume of blood received in culture bottles   Culture   Final    NO GROWTH 5 DAYS Performed at [facility], [address] [place], [place] [address]    Report Status 11/08/2018 FINAL  Final  Blood culture (routine x 2)     Status: None   Collection Time: 11/03/18  4:37 PM  Result Value Ref Range Status   Specimen Description BLOOD BLOOD LEFT WRIST  Final   Special Requests   Final    BOTTLES DRAWN AEROBIC AND ANAEROBIC Blood Culture adequate volume   Culture   Final    NO GROWTH 5 DAYS Performed at [facility], [address] [place], [place] [address]    Report Status 11/08/2018 FINAL  Final  MRSA PCR Screening     Status: Abnormal   Collection Time: 11/03/18  6:23 PM  Result Value Ref Range Status   MRSA by PCR POSITIVE (A) NEGATIVE Final    Comment:        The GeneXpert MRSA Assay (FDA approved for NASAL specimens only), is one component of a comprehensive MRSA colonization surveillance program. It is not intended to diagnose MRSA infection nor to guide or monitor treatment for MRSA infections. CRITICAL RESULT CALLED TO, READ BACK BY AND VERIFIED WITH: CALLED TO [name] @2150  [facility] 11/03/2018 Performed at [facility], [address] [place], [place] [address]   Expectorated sputum assessment w rflx to resp cult     Status: None   Collection Time: 11/05/18  5:07 PM  Result Value Ref Range Status   Specimen Description EXPECTORATED SPUTUM  Final   Special Requests Normal  Final   Sputum evaluation   Final    THIS SPECIMEN IS ACCEPTABLE FOR SPUTUM CULTURE Performed at [facility], [address] [place], [place] [address]    Report Status 11/05/2018 FINAL  Final  Culture, respiratory     Status: None   Collection Time: 11/05/18  5:07 PM  Result Value Ref Range Status   Specimen Description   Final    EXPECTORATED SPUTUM Performed at [facility], [address] [place], [place] [address]    Special Requests   Final    Normal Reflexed from [id number] Performed at [facility], [place], [place] [address]     Gram Stain   Final    NO WBC SEEN RARE GRAM POSITIVE COCCI RARE GRAM NEGATIVE RODS    Culture   Final    FEW Consistent with normal respiratory flora. Performed at [facility], [address], [place], [place] [address]    Report Status 11/08/2018 FINAL  Final      Scheduled Meds: . amiodarone  200 mg Oral Daily  . amLODipine  10 mg Oral Daily  . apixaban  5 mg Oral BID  . aspirin  81 mg Oral  Daily  . brimonidine  1 drop Both Eyes TID  . budesonide (PULMICORT) nebulizer solution  0.5 mg Nebulization BID  . Chlorhexidine Gluconate Cloth  6 each Topical Q0600  . cholecalciferol  1,000 Units Oral Daily  . docusate sodium  100 mg Oral BID  . doxycycline  100 mg Oral Q12H  . furosemide  40 mg Oral BID  . guaiFENesin  600 mg Oral BID  . insulin aspart  0-9 Units Subcutaneous TID WC  . ipratropium-albuterol  3 mL Nebulization Q6H  . latanoprost  1 drop Both Eyes QHS  . magnesium oxide  400 mg Oral BID  . mouth rinse  15 mL Mouth Rinse BID  . methylPREDNISolone (SOLU-MEDROL) injection  60 mg Intravenous Q12H  . metoprolol  200 mg Oral Daily  . mupirocin ointment  1 application Nasal BID  . spironolactone  12.5 mg Oral Daily   Continuous Infusions: . sodium chloride 500 mL (11/08/18 0603)  . ceFEPime (MAXIPIME) IV 2 g (11/08/18 0603)    Assessment/Plan:  1. Acute hypoxic respiratory failure.  Try to taper off oxygen.  Patient's pulse ox is in the 90s on 3 L. 2. COPD exacerbation with pneumonia.  Patient on cefepime and now doxycycline.  Continue steroids and nebulizer treatments.  Viral respiratory panel pending.  Flu negative. 3. 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  Continue Lasix Toprol Aldactone.  I think this is more COPD exacerbation rather than heart failure at this time. 4. Chronic atrial fibrillation.  Has AICD.  Continue amiodarone Toprol and Eliquis 5. Chronic kidney disease stage III.  Continue to monitor closely. 6. Glaucoma on  latanoprost  Code Status:     Code Status Orders  (From admission, onward)         Start     Ordered   11/03/18 2033  Full code  Continuous     11/03/18 2032        Code Status History    Date Active Date Inactive Code Status Order ID Comments User Context   08/30/2018 1045 09/02/2018 1616 Full Code [id number]  Fritzi Mandes, MD Inpatient   08/22/2018 1435 08/25/2018 2257 Full Code [id number]  Fritzi Mandes, MD Inpatient   01/08/2018 1523 01/11/2018 1712 Full Code [id number]  Nicholes Mango, MD ED   11/18/2017 2227 11/23/2017 2033 Full Code [id number]  Vaughan Basta, MD Inpatient   10/21/2016 1216 10/26/2016 2247 Full Code [id number]  Bettey Costa, MD Inpatient      Disposition Plan: To be determined  Antibiotics:  Cefepime  Doxycycline  Time spent: 28 minutes  [place]

## 2018-11-08 NOTE — Progress Notes (Signed)
Patient CBG was 212.  I explained to [name] that he will need some insulin coverage and the reason his CBg was elevated is because he is on steroids. He stated that he is not taking any insulin anymore.

## 2018-11-08 NOTE — Plan of Care (Signed)
  Problem: Health Behavior/Discharge Planning: Goal: Ability to manage health-related needs will improve Outcome: Progressing   Problem: Clinical Measurements: Goal: Ability to maintain clinical measurements within normal limits will improve Outcome: Progressing Goal: Will remain free from infection Outcome: Progressing Goal: Diagnostic test results will improve Outcome: Progressing Goal: Respiratory complications will improve Outcome: Progressing Goal: Cardiovascular complication will be avoided Outcome: Progressing   Problem: Pain Managment: Goal: General experience of comfort will improve Outcome: Progressing   Problem: Safety: Goal: Ability to remain free from injury will improve Outcome: Progressing   Problem: Education: Goal: Ability to demonstrate management of disease process will improve Outcome: Progressing Goal: Ability to verbalize understanding of medication therapies will improve Outcome: Progressing Goal: Individualized Educational Video(s) Outcome: Progressing   Problem: Activity: Goal: Capacity to carry out activities will improve Outcome: Progressing   Problem: Cardiac: Goal: Ability to achieve and maintain adequate cardiopulmonary perfusion will improve Outcome: Progressing

## 2018-11-09 LAB — RESPIRATORY PANEL BY PCR

## 2018-11-09 LAB — GLUCOSE, CAPILLARY: Glucose-Capillary: 211 mg/dL — ABNORMAL HIGH (ref 70–99)

## 2018-11-09 MED ORDER — ACETYLCYSTEINE 20 % IN SOLN
3.0000 mL | Freq: Two times a day (BID) | RESPIRATORY_TRACT | Status: DC
  Administered 2018-11-09 – 2018-11-12 (×6): 3 mL via RESPIRATORY_TRACT
  Filled 2018-11-09 (×8): qty 4

## 2018-11-09 MED ORDER — LACTULOSE 10 GM/15ML PO SOLN
30.0000 g | Freq: Two times a day (BID) | ORAL | Status: DC
  Administered 2018-11-09 (×2): 30 g via ORAL
  Filled 2018-11-09 (×3): qty 60

## 2018-11-09 MED ORDER — IPRATROPIUM-ALBUTEROL 0.5-2.5 (3) MG/3ML IN SOLN: 3.0000 mL | RESPIRATORY_TRACT | Status: DC | PRN

## 2018-11-09 NOTE — Plan of Care (Signed)
  Problem: Clinical Measurements: Goal: Diagnostic test results will improve Outcome: Progressing Goal: Cardiovascular complication will be avoided Outcome: Progressing   

## 2018-11-09 NOTE — Progress Notes (Signed)
Patient desat's to 87% on room air placed patient back to 2L of oxygen and now SpO2 is 95%. RN will continue to monitor.

## 2018-11-09 NOTE — [place] Notes (Signed)
Patient ID: Darius Taylor, male   DOB: [birth year], 73 y.o.   MRN: [id number]  [facility] PROGRESS NOTE  DEMONTRAE GILBERT HLK:[id number] DOB: 1946-09-22 DOA: 11/03/2018 PCP: [employer] [employer]  HPI/Subjective: Patient feeling better but not back to his usual breathing.  Still having some upper airway congestion.  Some cough.  Objective: Vitals:   11/09/18 0338 11/09/18 0910  BP: 132/74 (!) 121/53  Pulse: 66 63  Resp:  16  Temp: 98 F (36.7 C) 97.9 F (36.6 C)  SpO2: 93% 95%    Filed Weights   11/07/18 0523 11/08/18 0631 11/09/18 0338  Weight: 98.1 kg 96.6 kg 96.4 kg    ROS: Review of Systems  Constitutional: Negative for chills and fever.  Eyes: Negative for blurred vision.  Respiratory: Positive for cough and shortness of breath.   Cardiovascular: Negative for chest pain.  Gastrointestinal: Negative for abdominal pain, constipation, diarrhea, nausea and vomiting.  Genitourinary: Negative for dysuria.  Musculoskeletal: Negative for joint pain.  Neurological: Negative for dizziness and headaches.   Exam: Physical Exam  Constitutional: He is oriented to person, place, and time.  HENT:  Nose: No mucosal edema.  Mouth/Throat: No oropharyngeal exudate or posterior oropharyngeal edema.  Eyes: Pupils are equal, round, and reactive to light. Conjunctivae, EOM and lids are normal.  Neck: No JVD present. Carotid bruit is not present. No edema present. No thyroid mass and no thyromegaly present.  Cardiovascular: S1 normal and S2 normal. Exam reveals no gallop.  No murmur heard. Pulses:      Dorsalis pedis pulses are 2+ on the right side and 2+ on the left side.  Respiratory: No respiratory distress. He has decreased breath sounds in the right lower field and the left lower field. He has no wheezes. He has rhonchi in the right lower field and the left lower field. He has no rales.  GI: Soft. Bowel sounds are normal. There is no abdominal tenderness.   Musculoskeletal:     Right ankle: He exhibits swelling.     Left ankle: He exhibits swelling.  Lymphadenopathy:    He has no cervical adenopathy.  Neurological: He is alert and oriented to person, place, and time. No cranial nerve deficit.  Skin: Skin is warm. No rash noted. Nails show no clubbing.  Psychiatric: He has a normal mood and affect.      Data Reviewed: Basic Metabolic Panel: Recent Labs  Lab 11/04/18 0818 11/05/18 0444 11/06/18 0513 11/07/18 0453 11/08/18 0439  NA 137 136 135 137 137  K 4.2 4.8 5.1 4.7 4.7  CL 101 100 99 101 100  CO2 28 29 29 29  32  GLUCOSE 165* 174* 185* 202* 203*  BUN 41* 55* 72* 74* 75*  CREATININE 1.69* 1.72* 1.92* 1.90* 1.73*  CALCIUM 8.6* 8.7* 8.8* 8.8* 8.9   CBC: Recent Labs  Lab 11/03/18 1611 11/04/18 0818 11/05/18 0444 11/06/18 0513 11/07/18 0453  WBC 8.2 9.5 14.5* 16.3* 15.9*  NEUTROABS 5.9  --   --   --   --   HGB 11.3* 10.6* 10.3* 10.6* 10.3*  HCT 36.3* 33.4* 33.1* 33.7* 33.4*  MCV 97.3 94.6 95.4 97.1 97.9  PLT 209 207 216 233 247   Cardiac Enzymes: Recent Labs  Lab 11/03/18 1611 11/03/18 2212 11/04/18 0225 11/04/18 0818  TROPONINI 0.16* 0.17* 0.19* 0.15*   BNP (last 3 results) Recent Labs    08/22/18 1125 08/30/18 0830 11/03/18 1611  BNP 392.0* 310.0* 428.0*     CBG: Recent  Labs  Lab 11/07/18 2112 11/08/18 0811 11/08/18 1138 11/08/18 1722 11/08/18 2109  GLUCAP 160* 197* 193* 226* 212*    Recent Results (from the past 240 hour(s))  Blood culture (routine x 2)     Status: None   Collection Time: 11/03/18  4:37 PM  Result Value Ref Range Status   Specimen Description BLOOD RIGHT ANTECUBITAL  Final   Special Requests   Final    BOTTLES DRAWN AEROBIC AND ANAEROBIC Blood Culture results may not be optimal due to an excessive volume of blood received in culture bottles   Culture   Final    NO GROWTH 5 DAYS Performed at [facility], [address] [place], [place] [address]    Report  Status 11/08/2018 FINAL  Final  Blood culture (routine x 2)     Status: None   Collection Time: 11/03/18  4:37 PM  Result Value Ref Range Status   Specimen Description BLOOD BLOOD LEFT WRIST  Final   Special Requests   Final    BOTTLES DRAWN AEROBIC AND ANAEROBIC Blood Culture adequate volume   Culture   Final    NO GROWTH 5 DAYS Performed at [facility], [address] [place], [place] [address]    Report Status 11/08/2018 FINAL  Final  MRSA PCR Screening     Status: Abnormal   Collection Time: 11/03/18  6:23 PM  Result Value Ref Range Status   MRSA by PCR POSITIVE (A) NEGATIVE Final    Comment:        The GeneXpert MRSA Assay (FDA approved for NASAL specimens only), is one component of a comprehensive MRSA colonization surveillance program. It is not intended to diagnose MRSA infection nor to guide or monitor treatment for MRSA infections. CRITICAL RESULT CALLED TO, READ BACK BY AND VERIFIED WITH: CALLED TO [name] @2150  [facility] 11/03/2018 Performed at [facility], [address] [place], [place] [address]   Expectorated sputum assessment w rflx to resp cult     Status: None   Collection Time: 11/05/18  5:07 PM  Result Value Ref Range Status   Specimen Description EXPECTORATED SPUTUM  Final   Special Requests Normal  Final   Sputum evaluation   Final    THIS SPECIMEN IS ACCEPTABLE FOR SPUTUM CULTURE Performed at [facility], [address], [place], [place] [address]    Report Status 11/05/2018 FINAL  Final  Culture, respiratory     Status: None   Collection Time: 11/05/18  5:07 PM  Result Value Ref Range Status   Specimen Description   Final    EXPECTORATED SPUTUM Performed at [facility], [address] [place], [place] [address]    Special Requests   Final    Normal Reflexed from [phone] Performed at [facility], [place], [place] [address]    Gram Stain   Final    NO WBC SEEN RARE GRAM  POSITIVE COCCI RARE GRAM NEGATIVE RODS    Culture   Final    FEW Consistent with normal respiratory flora. Performed at [facility], [place] [address], [place], [place] [address]    Report Status 11/08/2018 FINAL  Final  Respiratory Panel by PCR     Status: None   Collection Time: 11/08/18 12:30 PM  Result Value Ref Range Status   Adenovirus NOT DETECTED NOT DETECTED Final   Coronavirus 229E NOT DETECTED NOT DETECTED Final    Comment: (NOTE) The Coronavirus on the  Respiratory Panel, DOES NOT test for the novel  Coronavirus (2019 nCoV)    Coronavirus HKU1 NOT DETECTED NOT DETECTED Final   Coronavirus NL63 NOT DETECTED NOT DETECTED Final   Coronavirus OC43 NOT DETECTED NOT DETECTED Final   Metapneumovirus NOT DETECTED NOT DETECTED Final   Rhinovirus / Enterovirus NOT DETECTED NOT DETECTED Final   Influenza A NOT DETECTED NOT DETECTED Final   Influenza B NOT DETECTED NOT DETECTED Final   Parainfluenza Virus 1 NOT DETECTED NOT DETECTED Final   Parainfluenza Virus 2 NOT DETECTED NOT DETECTED Final   Parainfluenza Virus 3 NOT DETECTED NOT DETECTED Final   Parainfluenza Virus 4 NOT DETECTED NOT DETECTED Final   Respiratory Syncytial Virus NOT DETECTED NOT DETECTED Final   Bordetella pertussis NOT DETECTED NOT DETECTED Final   Chlamydophila pneumoniae NOT DETECTED NOT DETECTED Final   Mycoplasma pneumoniae NOT DETECTED NOT DETECTED Final    Comment: Performed at [facility], [place] [address], [place],  [address]      Scheduled Meds: . acetylcysteine  3 mL Nebulization BID  . amiodarone  200 mg Oral Daily  . amLODipine  10 mg Oral Daily  . apixaban  5 mg Oral BID  . aspirin  81 mg Oral Daily  . brimonidine  1 drop Both Eyes TID  . budesonide (PULMICORT) nebulizer solution  0.5 mg Nebulization BID  . cholecalciferol  1,000 Units Oral Daily  . docusate sodium  100 mg Oral BID  . doxycycline  100 mg Oral Q12H  . furosemide  40 mg Oral BID  . guaiFENesin  600 mg  Oral BID  . insulin aspart  0-5 Units Subcutaneous QHS  . insulin aspart  0-9 Units Subcutaneous TID WC  . ipratropium-albuterol  3 mL Nebulization Q6H  . lactulose  30 g Oral BID  . latanoprost  1 drop Both Eyes QHS  . magnesium oxide  400 mg Oral BID  . mouth rinse  15 mL Mouth Rinse BID  . methylPREDNISolone (SOLU-MEDROL) injection  60 mg Intravenous Q12H  . metoprolol  200 mg Oral Daily  . polyethylene glycol  17 g Oral Daily  . spironolactone  12.5 mg Oral Daily   Continuous Infusions: . sodium chloride 500 mL (11/09/18 0554)    Assessment/Plan:  1. Acute hypoxic respiratory failure.  Try to taper off oxygen.  Currently on 2 L.  Check pulse ox on room air. 2. COPD exacerbation with pneumonia.  Patient on cefepime and now doxycycline.  Continue steroids and nebulizer treatments.  Viral respiratory panel negative.  Flu negative.  Since patient has some upper airway congestion I added Mucomyst nebulizers to try to stimulate some movement of this mucus. 3. 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  Continue Lasix Toprol Aldactone.  I think this is more COPD exacerbation rather than heart failure at this time. 4. Chronic atrial fibrillation.  Has AICD.  Continue amiodarone Toprol and Eliquis 5. Chronic kidney disease stage III.  Continue to monitor closely. 6. 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  Code Status:     Code Status Orders  (From admission, onward)         Start     Ordered   11/03/18 2033  Full code  Continuous     11/03/18 2032        Code Status History    Date Active Date Inactive Code Status Order ID Comments User Context   08/30/2018 1045 09/02/2018 1616 Full Code [id number]  Fritzi Mandes,  MD Inpatient   08/22/2018 1435 08/25/2018 2257 Full Code [id number]  Fritzi Mandes, MD Inpatient   01/08/2018 1523 01/11/2018 1712 Full Code [id number]  Nicholes Mango, MD ED   11/18/2017 2227 11/23/2017 2033 Full Code [id number]  Vaughan Basta, MD Inpatient   10/21/2016  1216 10/26/2016 2247 Full Code [id number]  Bettey Costa, MD Inpatient      Disposition Plan: To be determined  Antibiotics:  Cefepime  Doxycycline  Time spent: 27 minutes.  Left message for daughter on the phone.  Anesha Hackert [employer]

## 2018-11-09 NOTE — Plan of Care (Signed)
  Problem: Health Behavior/Discharge Planning: Goal: Ability to manage health-related needs will improve Outcome: Progressing   Problem: Pain Managment: Goal: General experience of comfort will improve Outcome: Progressing   Problem: Education: Goal: Ability to demonstrate management of disease process will improve Outcome: Progressing Goal: Ability to verbalize understanding of medication therapies will improve Outcome: Progressing

## 2018-11-10 ENCOUNTER — Inpatient Hospital Stay: Payer: Medicare HMO

## 2018-11-10 LAB — GLUCOSE, CAPILLARY
Glucose-Capillary: 227 mg/dL — ABNORMAL HIGH (ref 70–99)
Glucose-Capillary: 199 mg/dL — ABNORMAL HIGH (ref 70–99)

## 2018-11-10 LAB — CBC
HCT: 35.3 % — ABNORMAL LOW (ref 39.0–52.0)
HEMOGLOBIN: 11.2 g/dL — AB (ref 13.0–17.0)
MCH: 30.4 pg (ref 26.0–34.0)
MCHC: 31.7 g/dL (ref 30.0–36.0)
MCV: 95.7 fL (ref 80.0–100.0)
Platelets: 298 10*3/uL (ref 150–400)
RBC: 3.69 MIL/uL — ABNORMAL LOW (ref 4.22–5.81)
RDW: 14.7 % (ref 11.5–15.5)
WBC: 17.7 10*3/uL — ABNORMAL HIGH (ref 4.0–10.5)
nRBC: 0.2 % (ref 0.0–0.2)

## 2018-11-10 MED ORDER — LACTULOSE 10 GM/15ML PO SOLN: 30.0000 g | Freq: Two times a day (BID) | ORAL | Status: DC | PRN

## 2018-11-10 NOTE — [place] Notes (Signed)
SATURATION QUALIFICATIONS: (This note is used to comply with regulatory documentation for home oxygen)  Patient Saturations on Room Air at Rest = 80%  Patient Saturations on Room Air while Ambulating = 77%  Patient Saturations on 2 Liters of oxygen while Ambulating = 92%  Please briefly explain why patient needs home oxygen:

## 2018-11-10 NOTE — [place] Notes (Signed)
Patient ID: Darius Taylor, male   DOB: [birth year], 73 y.o.   MRN: [id number]  [facility] PROGRESS NOTE  Darius Taylor NKN:[id number] DOB: 04/09/1946 DOA: 11/03/2018 PCP: [employer] [employer]  HPI/Subjective: Patient feeling better but still with some cough and shortness of breath and wheeze.  Objective: Vitals:   11/10/18 0521 11/10/18 0924  BP: 132/75 (!) 152/76  Pulse: 60 64  Resp: 16 18  Temp: 98.2 F (36.8 C) 98 F (36.7 C)  SpO2: 96% 95%    Filed Weights   11/08/18 0631 11/09/18 0338 11/10/18 0521  Weight: 96.6 kg 96.4 kg 95.7 kg    ROS: Review of Systems  Constitutional: Negative for chills and fever.  Eyes: Negative for blurred vision.  Respiratory: Positive for cough, shortness of breath and wheezing.   Cardiovascular: Negative for chest pain.  Gastrointestinal: Negative for abdominal pain, constipation, diarrhea, nausea and vomiting.  Genitourinary: Negative for dysuria.  Musculoskeletal: Negative for joint pain.  Neurological: Negative for dizziness and headaches.   Exam: Physical Exam  Constitutional: He is oriented to person, place, and time.  HENT:  Nose: No mucosal edema.  Mouth/Throat: No oropharyngeal exudate or posterior oropharyngeal edema.  Eyes: Pupils are equal, round, and reactive to light. Conjunctivae, EOM and lids are normal.  Neck: No JVD present. Carotid bruit is not present. No edema present. No thyroid mass and no thyromegaly present.  Cardiovascular: S1 normal and S2 normal. Exam reveals no gallop.  No murmur heard. Pulses:      Dorsalis pedis pulses are 2+ on the right side and 2+ on the left side.  Respiratory: No respiratory distress. He has decreased breath sounds in the right lower field and the left lower field. He has no wheezes. He has rhonchi in the right lower field and the left lower field. He has no rales.  GI: Soft. Bowel sounds are normal. There is no abdominal tenderness.  Musculoskeletal:     Right  ankle: He exhibits swelling.     Left ankle: He exhibits swelling.  Lymphadenopathy:    He has no cervical adenopathy.  Neurological: He is alert and oriented to person, place, and time. No cranial nerve deficit.  Skin: Skin is warm. No rash noted. Nails show no clubbing.  Psychiatric: He has a normal mood and affect.      Data Reviewed: Basic Metabolic Panel: Recent Labs  Lab 11/04/18 0818 11/05/18 0444 11/06/18 0513 11/07/18 0453 11/08/18 0439  NA 137 136 135 137 137  K 4.2 4.8 5.1 4.7 4.7  CL 101 100 99 101 100  CO2 28 29 29 29  32  GLUCOSE 165* 174* 185* 202* 203*  BUN 41* 55* 72* 74* 75*  CREATININE 1.69* 1.72* 1.92* 1.90* 1.73*  CALCIUM 8.6* 8.7* 8.8* 8.8* 8.9   CBC: Recent Labs  Lab 11/03/18 1611 11/04/18 0818 11/05/18 0444 11/06/18 0513 11/07/18 0453 11/10/18 0426  WBC 8.2 9.5 14.5* 16.3* 15.9* 17.7*  NEUTROABS 5.9  --   --   --   --   --   HGB 11.3* 10.6* 10.3* 10.6* 10.3* 11.2*  HCT 36.3* 33.4* 33.1* 33.7* 33.4* 35.3*  MCV 97.3 94.6 95.4 97.1 97.9 95.7  PLT 209 207 216 233 247 298   Cardiac Enzymes: Recent Labs  Lab 11/03/18 1611 11/03/18 2212 11/04/18 0225 11/04/18 0818  TROPONINI 0.16* 0.17* 0.19* 0.15*   BNP (last 3 results) Recent Labs    08/22/18 1125 08/30/18 0830 11/03/18 1611  BNP 392.0* 310.0* 428.0*  CBG: Recent Labs  Lab 11/08/18 1138 11/08/18 1722 11/08/18 2109 11/09/18 2343 11/10/18 0803  GLUCAP 193* 226* 212* 211* 227*    Recent Results (from the past 240 hour(s))  Blood culture (routine x 2)     Status: None   Collection Time: 11/03/18  4:37 PM  Result Value Ref Range Status   Specimen Description BLOOD RIGHT ANTECUBITAL  Final   Special Requests   Final    BOTTLES DRAWN AEROBIC AND ANAEROBIC Blood Culture results may not be optimal due to an excessive volume of blood received in culture bottles   Culture   Final    NO GROWTH 5 DAYS Performed at [facility], [address] [place], [place]  [address]    Report Status 11/08/2018 FINAL  Final  Blood culture (routine x 2)     Status: None   Collection Time: 11/03/18  4:37 PM  Result Value Ref Range Status   Specimen Description BLOOD BLOOD LEFT WRIST  Final   Special Requests   Final    BOTTLES DRAWN AEROBIC AND ANAEROBIC Blood Culture adequate volume   Culture   Final    NO GROWTH 5 DAYS Performed at [facility], [address] [place], [place] [address]    Report Status 11/08/2018 FINAL  Final  MRSA PCR Screening     Status: Abnormal   Collection Time: 11/03/18  6:23 PM  Result Value Ref Range Status   MRSA by PCR POSITIVE (A) NEGATIVE Final    Comment:        The GeneXpert MRSA Assay (FDA approved for NASAL specimens only), is one component of a comprehensive MRSA colonization surveillance program. It is not intended to diagnose MRSA infection nor to guide or monitor treatment for MRSA infections. CRITICAL RESULT CALLED TO, READ BACK BY AND VERIFIED WITH: CALLED TO Darius Taylor @2150  [facility] 11/03/2018 Performed at [facility], [address] [place], [place] [address]   Expectorated sputum assessment w rflx to resp cult     Status: None   Collection Time: 11/05/18  5:07 PM  Result Value Ref Range Status   Specimen Description EXPECTORATED SPUTUM  Final   Special Requests Normal  Final   Sputum evaluation   Final    THIS SPECIMEN IS ACCEPTABLE FOR SPUTUM CULTURE Performed at [facility], [address], [place], [place] [address]    Report Status 11/05/2018 FINAL  Final  Culture, respiratory     Status: None   Collection Time: 11/05/18  5:07 PM  Result Value Ref Range Status   Specimen Description   Final    EXPECTORATED SPUTUM Performed at [facility], [address], [place], [place] [address]    Special Requests   Final    Normal Reflexed from [phone] Performed at [facility], [place], [place] [address]    Gram Stain   Final    NO WBC  SEEN RARE GRAM POSITIVE COCCI RARE GRAM NEGATIVE RODS    Culture   Final    FEW Consistent with normal respiratory flora. Performed at [facility], [place] [address], [place], [place] [address]    Report Status 11/08/2018 FINAL  Final  Respiratory Panel by PCR     Status: None   Collection Time: 11/08/18 12:30 PM  Result Value Ref Range Status   Adenovirus NOT DETECTED NOT DETECTED Final   Coronavirus 229E NOT DETECTED NOT DETECTED Final    Comment: (NOTE) The Coronavirus  on the Respiratory Panel, DOES NOT test for the novel  Coronavirus (2019 nCoV)    Coronavirus HKU1 NOT DETECTED NOT DETECTED Final   Coronavirus NL63 NOT DETECTED NOT DETECTED Final   Coronavirus OC43 NOT DETECTED NOT DETECTED Final   Metapneumovirus NOT DETECTED NOT DETECTED Final   Rhinovirus / Enterovirus NOT DETECTED NOT DETECTED Final   Influenza A NOT DETECTED NOT DETECTED Final   Influenza B NOT DETECTED NOT DETECTED Final   Parainfluenza Virus 1 NOT DETECTED NOT DETECTED Final   Parainfluenza Virus 2 NOT DETECTED NOT DETECTED Final   Parainfluenza Virus 3 NOT DETECTED NOT DETECTED Final   Parainfluenza Virus 4 NOT DETECTED NOT DETECTED Final   Respiratory Syncytial Virus NOT DETECTED NOT DETECTED Final   Bordetella pertussis NOT DETECTED NOT DETECTED Final   Chlamydophila pneumoniae NOT DETECTED NOT DETECTED Final   Mycoplasma pneumoniae NOT DETECTED NOT DETECTED Final    Comment: Performed at [facility], [place] [address], [place], [place] [address]      Scheduled Meds: . acetylcysteine  3 mL Nebulization BID  . amiodarone  200 mg Oral Daily  . amLODipine  10 mg Oral Daily  . apixaban  5 mg Oral BID  . aspirin  81 mg Oral Daily  . brimonidine  1 drop Both Eyes TID  . budesonide (PULMICORT) nebulizer solution  0.5 mg Nebulization BID  . cholecalciferol  1,000 Units Oral Daily  . docusate sodium  100 mg Oral BID  . doxycycline  100 mg Oral Q12H  . furosemide  40 mg Oral BID  .  guaiFENesin  600 mg Oral BID  . insulin aspart  0-5 Units Subcutaneous QHS  . insulin aspart  0-9 Units Subcutaneous TID WC  . ipratropium-albuterol  3 mL Nebulization Q6H  . latanoprost  1 drop Both Eyes QHS  . magnesium oxide  400 mg Oral BID  . mouth rinse  15 mL Mouth Rinse BID  . methylPREDNISolone (SOLU-MEDROL) injection  60 mg Intravenous Q12H  . metoprolol  200 mg Oral Daily  . polyethylene glycol  17 g Oral Daily  . spironolactone  12.5 mg Oral Daily   Continuous Infusions: . sodium chloride 500 mL (11/09/18 0554)    Assessment/Plan:  1. Acute hypoxic respiratory failure.  With walking in the hallway pulse ox dropped down into the 70s.  Patient will need chronic oxygen.  Patient states he has it at home but does not wear it all the time.  He will need to wear it. 2. COPD exacerbation with pneumonia.  Patient finished cefepime and now doxycycline.  Continue steroids and nebulizer treatments.  Viral respiratory panel negative.  Flu negative.  Since patient has some upper airway congestion I added Mucomyst nebulizers to try to stimulate some movement of this mucus.  After coughing his lungs sounded better today.  After lying down then I heard more of a wheeze.  Will get CT scan of the chest for further evaluation since patient has pneumonia in the same spot back from 2019. 3. 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  Continue Lasix Toprol Aldactone.  I think this is more COPD exacerbation rather than heart failure at this time. 4. Chronic atrial fibrillation.  Has AICD.  Continue amiodarone Toprol and Eliquis 5. Chronic kidney disease stage III.  Continue to monitor closely. 6. 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  Code Status:     Code Status Orders  (From admission, onward)  Start     Ordered   11/03/18 2033  Full code  Continuous     11/03/18 2032        Code Status History    Date Active Date Inactive Code Status Order ID Comments User Context   08/30/2018  1045 09/02/2018 1616 Full Code [id number]  Fritzi Mandes, MD Inpatient   08/22/2018 1435 08/25/2018 2257 Full Code [id number]  Fritzi Mandes, MD Inpatient   01/08/2018 1523 01/11/2018 1712 Full Code [id number]  Nicholes Mango, MD ED   11/18/2017 2227 11/23/2017 2033 Full Code [id number]  Vaughan Basta, MD Inpatient   10/21/2016 1216 10/26/2016 2247 Full Code [id number]  Bettey Costa, MD Inpatient      Disposition Plan: To be determined  Antibiotics:  Cefepime course finished  Doxycycline  Time spent: 27 minutes.  Spoke with daughter on the phone yesterday  Ashrita Chrismer [employer]

## 2018-11-11 LAB — BASIC METABOLIC PANEL
Anion gap: 6 (ref 5–15)
BUN: 75 mg/dL — ABNORMAL HIGH (ref 8–23)
CO2: 30 mmol/L (ref 22–32)
Calcium: 8.5 mg/dL — ABNORMAL LOW (ref 8.9–10.3)
Chloride: 100 mmol/L (ref 98–111)
Creatinine, Ser: 1.4 mg/dL — ABNORMAL HIGH (ref 0.61–1.24)
GFR calc Af Amer: 57 mL/min — ABNORMAL LOW (ref >60)
GFR calc non Af Amer: 49 mL/min — ABNORMAL LOW (ref >60)
Glucose, Bld: 289 mg/dL — ABNORMAL HIGH (ref 70–99)
Potassium: 4.7 mmol/L (ref 3.5–5.1)
Sodium: 136 mmol/L (ref 135–145)

## 2018-11-11 LAB — GLUCOSE, CAPILLARY
Glucose-Capillary: 249 mg/dL — ABNORMAL HIGH (ref 70–99)
Glucose-Capillary: 359 mg/dL — ABNORMAL HIGH (ref 70–99)
Glucose-Capillary: 362 mg/dL — ABNORMAL HIGH (ref 70–99)
Glucose-Capillary: 360 mg/dL — ABNORMAL HIGH (ref 70–99)

## 2018-11-11 MED ORDER — ALPRAZOLAM 0.5 MG PO TABS
0.5000 mg | ORAL_TABLET | Freq: Once | ORAL | Status: AC
  Administered 2018-11-11: 0.5 mg via ORAL
  Filled 2018-11-11: qty 1

## 2018-11-11 MED ORDER — METHYLPREDNISOLONE SODIUM SUCC 40 MG IJ SOLR: 40.0000 mg | Freq: Every day | INTRAMUSCULAR | Status: DC

## 2018-11-11 NOTE — Consult Note (Signed)
Darius Taylor, [name] [id number] 03-07-1946 Darius Grayer, MD  Reason for Consult: Hoarseness  HPI: 73 year old gentleman admitted for aspiration of COPD and pneumonia has been in the hospital with slow progression.  Dr. Leslye Peer has asked for ENT evaluation for chronic hoarseness.  Darius Taylor has been hoarse for many years he said he has had a deep voice for as long as he can remember.  He did have a long smoking history but stopped approximately 10 years ago.  Has had no recent change in his voice.  Is having a hard time coughing up his secretions.  Allergies: No Known Allergies  ROS: Review of systems normal other than 12 systems except per HPI.  PMH:  Past Medical History:  Diagnosis Date  . Aortic stenosis   . Arrhythmia    atrial fibrillation  . Asthma   . Atrial fibrillation ([place])   . CAD (coronary artery disease)   . Cancer [facility])    colon, bladder and kidney  . CHF (congestive heart failure) ([place])   . 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 COPD (chronic obstructive pulmonary disease) ([place])   . H/O ventricular tachycardia   . Hypertension   . Nonischemic cardiomyopathy ([place])    Status post AICD and pacemaker  . Renal cell carcinoma (H[facility])     FH:  Family History  Problem Relation Age of Onset  . Hypertension Mother   . Stroke Father     SH:  Social History   Socioeconomic History  . Marital status: Widowed    Spouse name: Not on file  . Number of children: Not on file  . Years of education: Not on file  . Highest education level: Not on file  Occupational History  . Not on file  Social Needs  . Financial resource strain: Not hard at all  . Food insecurity:    Worry: Not on file    Inability: Never true  . Transportation needs:    Medical: No    Non-medical: No  Tobacco Use  . Smoking status: Former Research scientist (life sciences)  . Smokeless tobacco: Never Used  Substance and Sexual Activity  . Alcohol use: No  . Drug use: No  . Sexual activity: Not on file  Lifestyle  . Physical  activity:    Days per week: 3 days    Minutes per session: 10 min  . Stress: Not at all  Relationships  . Social connections:    Talks on phone: Patient refused    Gets together: Patient refused    Attends religious service: Patient refused    Active member of club or organization: Not on file    Attends meetings of clubs or organizations: Patient refused    Relationship status: Patient refused  . Intimate partner violence:    Fear of current or ex partner: Patient refused    Emotionally abused: Patient refused    Physically abused: Patient refused    Forced sexual activity: Patient refused  Other Topics Concern  . Not on file  Social History Narrative   Lives at home by himself.  Independent at baseline    PSH:  Past Surgical History:  Procedure Laterality Date  . CARDIAC DEFIBRILLATOR PLACEMENT    . CARDIAC DEFIBRILLATOR PLACEMENT    . COLONOSCOPY WITH PROPOFOL N/A 06/10/2018   Procedure: COLONOSCOPY WITH PROPOFOL;  Surgeon: Lin Landsman, MD;  Location: [facility] ENDOSCOPY;  Service: Gastroenterology;  Laterality: N/A;  . CORONARY ANGIOPLASTY  09/25/2016  . IRRIGATION AND DEBRIDEMENT HEMATOMA Left 10/24/2016  Procedure: IRRIGATION AND DEBRIDEMENT HEMATOMA;  Surgeon: Florene Glen, MD;  Location: [facility] ORS;  Service: General;  Laterality: Left;  . KIDNEY SURGERY     left ne[hrectomy for RCC  . PACEMAKER IMPLANT    . PARTIAL COLECTOMY      Physical  Exam: External ears clear anterior nose patent oral cavity oropharynx unremarkable.  Anterior neck clear  Procedure: Flexible fiberoptic laryngoscopy-after topical anesthesia of phenylephrine lidocaine solution within each nostril total of 2 cc was used.  A flexible fiberoptic laryngoscope was introduced into the right nostril.  Nasopharynx appeared clear.  Examination larynx showed normal epiglottis.  He does have a smooth cystic mass the left supraglottis this is not obstructing the airway.  He does not appear to have a vocal  cord paralysis there are some chronic changes of the vocal folds consistent  with smoking but I could see no evidence of malignancy.   A/P: Chronic hoarseness I suspect this is coming from long history of smoking I see no evidence of acute pathology.  He does have a cyst of the left supraglottis which I would like to continue to follow as an outpatient.  Does not appear to be obstructing the airway and should not interfere with his ability to clear secretions.  I will schedule him for an outpatient appointment to see me after discharge.   Darius Taylor 11/11/2018 4:54 PM

## 2018-11-11 NOTE — Progress Notes (Signed)
Patient called and asked for something to help him relax.  States, "I feel rough."  On call MD text paged.

## 2018-11-11 NOTE — Progress Notes (Signed)
Physical Therapy Treatment Patient Details Name: Darius Taylor MRN: [id number] DOB: [birth year] Today's Date: 11/11/2018    History of Present Illness From MD H&P: Pt is a 73 y.o. male with a known history of atrial fibrillation on eliquis, CAD, nonischemic cardiomyopathy with last known EF of 25% status post AICD and pacemaker, CKD, history of colonic adenocarcinoma status post partial colectomy, history of renal cell carcinoma status post left nephrectomy, COPD on as needed home oxygen presented to the hospital secondary to worsening shortness of breath. He stated he had a cough for almost a week.  His breathing started to slowly get worse.  His sats were noted to be in the 70s with EMS.  Patient was noted to have increased work of breathing and hypoxia initially and so placed on BiPAP in the ED.  Labs indicated an elevated troponin, elevated BNP and chest x-ray showed left midlung airspace disease concerning for pneumonia.  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     PT Comments    Upon PT entering pt's room, pt's O2 sats 88-89% on 2 L O2 via nasal cannula at rest; nurse notified who cleared PT to increase supplemental O2 to 3 L which improved pt's O2 sats to 92%.  Pt able to ambulate around nursing loop with RW with SBA (pt demonstrating slow cadence but steady ambulating with RW); increased SOB noted towards end of ambulation and O2 sats 84% on 3 L O2 via nasal cannula end of ambulation.  Within a few minutes of sitting rest break and vc's for pursed lip breathing, pt's O2 sats improved to 92-93% on 3 L O2 via nasal cannula (nurse present end of session and notified of pt's O2 sats).  Will continue to progress pt with strengthening, balance, and progressive functional mobility per pt tolerance.    Follow Up Recommendations  Home health PT     Equipment Recommendations  Rolling  walker with 5" wheels    Recommendations for Other Services       Precautions / Restrictions Precautions Precautions: Fall Restrictions Weight Bearing Restrictions: No Other Position/Activity Restrictions: Watch O2 sats    Mobility  Bed Mobility Overal bed mobility: Modified Independent             General bed mobility comments: Semi-supine to sit without any noted difficulties; HOB elevated.  Transfers Overall transfer level: Needs assistance Equipment used: None Transfers: Sit to/from Stand Sit to Stand: Supervision         General transfer comment: good strength with standing and sitting  Ambulation/Gait Ambulation/Gait assistance: Supervision Gait Distance (Feet): 200 Feet Assistive device: Rolling walker (2 wheeled) Gait Pattern/deviations: Step-through pattern Gait velocity: decreased   General Gait Details: steady with RW; SOB noted with increased distance ambulating   Stairs             Wheelchair Mobility    Modified Rankin (Stroke Patients Only)       Balance Overall balance assessment: Needs assistance Sitting-balance support: No upper extremity supported;Feet supported Sitting balance-Leahy Scale: Normal Sitting balance - Comments: steady sitting reaching outside BOS   Standing balance support: No upper extremity supported Standing balance-Leahy Scale: Good Standing balance comment: steady standing reaching within BOS                            Cognition Arousal/Alertness: Awake/alert Behavior During Therapy: WFL for tasks assessed/performed Overall  Cognitive Status: Within Functional Limits for tasks assessed                                        Exercises      General Comments   Nursing cleared pt for participation in physical therapy.  Pt agreeable to PT session and reports he has been walking in hallways with staff.      Pertinent Vitals/Pain Pain Assessment: No/denies pain  HR WFL     Home Living                      Prior Function            PT Goals (current goals can now be found in the care plan section) Acute Rehab PT Goals Patient Stated Goal: Improved endurance PT Goal Formulation: With patient Time For Goal Achievement: 11/17/18 Potential to Achieve Goals: Good Progress towards PT goals: Progressing toward goals    Frequency    Min 2X/week      PT Plan Current plan remains appropriate    Co-evaluation              AM-PAC PT "6 Clicks" Mobility   Outcome Measure  Help needed turning from your back to your side while in a flat bed without using bedrails?: None Help needed moving from lying on your back to sitting on the side of a flat bed without using bedrails?: None Help needed moving to and from a bed to a chair (including a wheelchair)?: A Little Help needed standing up from a chair using your arms (e.g., wheelchair or bedside chair)?: A Little Help needed to walk in hospital room?: A Little Help needed climbing 3-5 steps with a railing? : A Little 6 Click Score: 20    End of Session Equipment Utilized During Treatment: Gait belt;Oxygen Activity Tolerance: Patient tolerated treatment well Patient left: (sitting on edge of bed with nurse present) Nurse Communication: Mobility status;Precautions;Other (comment)(Pt's O2 sats.  Nurse cleared pt to not have bed alarm on) PT Visit Diagnosis: Muscle weakness (generalized) (M62.81);Difficulty in walking, not elsewhere classified (R26.2)     Time: 3276-1470 PT Time Calculation (min) (ACUTE ONLY): 28 min  Charges:  $Therapeutic Exercise: 23-37 mins                     Leitha Bleak, PT 11/11/18, 9:46 AM [phone]

## 2018-11-11 NOTE — Progress Notes (Signed)
Patient ID: [name], male   DOB: [birth year], 73 y.o.   MRN: [id number]  [employer] PROGRESS NOTE  Darius Taylor QHU:[id number] DOB: 1946-06-08 DOA: 11/03/2018 PCP: [facility] [employer]  HPI/Subjective: Patient feels like Darius Taylor is breathing a little bit better.  Still having difficulty with coughing things up.  Still has a little audible wheeze when Darius Taylor is lying back flat.  Objective: Vitals:   11/11/18 0803 11/11/18 0808  BP: (!) 141/75   Pulse: 65   Resp: 18   Temp: 98.4 F (36.9 C)   SpO2: 93% 93%    Filed Weights   11/09/18 0338 11/10/18 0521 11/11/18 0545  Weight: 96.4 kg 95.7 kg 94.1 kg    ROS: Review of Systems  Constitutional: Negative for chills and fever.  Eyes: Negative for blurred vision.  Respiratory: Positive for cough, shortness of breath and wheezing.   Cardiovascular: Negative for chest pain.  Gastrointestinal: Negative for abdominal pain, constipation, diarrhea, nausea and vomiting.  Genitourinary: Negative for dysuria.  Musculoskeletal: Negative for joint pain.  Neurological: Negative for dizziness and headaches.   Exam: Physical Exam  Constitutional: Darius Taylor is oriented to person, place, and time.  HENT:  Nose: No mucosal edema.  Mouth/Throat: No oropharyngeal exudate or posterior oropharyngeal edema.  Eyes: Pupils are equal, round, and reactive to light. Conjunctivae, EOM and lids are normal.  Neck: No JVD present. Carotid bruit is not present. No edema present. No thyroid mass and no thyromegaly present.  Cardiovascular: S1 normal and S2 normal. Exam reveals no gallop.  No murmur heard. Pulses:      Dorsalis pedis pulses are 2+ on the right side and 2+ on the left side.  Respiratory: No respiratory distress. Darius Taylor has decreased breath sounds in the right lower field and the left lower field. Darius Taylor has no wheezes. Darius Taylor has rhonchi in the right lower field and the left lower field. Darius Taylor has no rales.  Transmitted wheeze from upper airway.  GI:  Soft. Bowel sounds are normal. There is no abdominal tenderness.  Musculoskeletal:     Right ankle: Darius Taylor exhibits swelling.     Left ankle: Darius Taylor exhibits swelling.  Lymphadenopathy:    Darius Taylor has no cervical adenopathy.  Neurological: Darius Taylor is alert and oriented to person, place, and time. No cranial nerve deficit.  Skin: Skin is warm. No rash noted. Nails show no clubbing.  Psychiatric: Darius Taylor has a normal mood and affect.      Data Reviewed: Basic Metabolic Panel: Recent Labs  Lab 11/05/18 0444 11/06/18 0513 11/07/18 0453 11/08/18 0439 11/11/18 0459  NA 136 135 137 137 136  K 4.8 5.1 4.7 4.7 4.7  CL 100 99 101 100 100  CO2 29 29 29  32 30  GLUCOSE 174* 185* 202* 203* 289*  BUN 55* 72* 74* 75* 75*  CREATININE 1.72* 1.92* 1.90* 1.73* 1.40*  CALCIUM 8.7* 8.8* 8.8* 8.9 8.5*   CBC: Recent Labs  Lab 11/05/18 0444 11/06/18 0513 11/07/18 0453 11/10/18 0426  WBC 14.5* 16.3* 15.9* 17.7*  HGB 10.3* 10.6* 10.3* 11.2*  HCT 33.1* 33.7* 33.4* 35.3*  MCV 95.4 97.1 97.9 95.7  PLT 216 233 247 298   Cardiac Enzymes: No results for input(s): CKTOTAL, CKMB, CKMBINDEX, TROPONINI in the last 168 hours. BNP (last 3 results) Recent Labs    08/22/18 1125 08/30/18 0830 11/03/18 1611  BNP 392.0* 310.0* 428.0*     CBG: Recent Labs  Lab 11/09/18 2343 11/10/18 0803 11/10/18 2124 11/11/18 0800 11/11/18 1135  GLUCAP 211* 227* 199* 249* 359*    Recent Results (from the past 240 hour(s))  Blood culture (routine x 2)     Status: None   Collection Time: 11/03/18  4:37 PM  Result Value Ref Range Status   Specimen Description BLOOD RIGHT ANTECUBITAL  Final   Special Requests   Final    BOTTLES DRAWN AEROBIC AND ANAEROBIC Blood Culture results may not be optimal due to an excessive volume of blood received in culture bottles   Culture   Final    NO GROWTH 5 DAYS Performed at [facility], [address] [place], [place] [address]    Report Status 11/08/2018 FINAL  Final  Blood  culture (routine x 2)     Status: None   Collection Time: 11/03/18  4:37 PM  Result Value Ref Range Status   Specimen Description BLOOD BLOOD LEFT WRIST  Final   Special Requests   Final    BOTTLES DRAWN AEROBIC AND ANAEROBIC Blood Culture adequate volume   Culture   Final    NO GROWTH 5 DAYS Performed at [facility], [address] [place], [place] [address]    Report Status 11/08/2018 FINAL  Final  MRSA PCR Screening     Status: Abnormal   Collection Time: 11/03/18  6:23 PM  Result Value Ref Range Status   MRSA by PCR POSITIVE (A) NEGATIVE Final    Comment:        The GeneXpert MRSA Assay (FDA approved for NASAL specimens only), is one component of a comprehensive MRSA colonization surveillance program. It is not intended to diagnose MRSA infection nor to guide or monitor treatment for MRSA infections. CRITICAL RESULT CALLED TO, READ BACK BY AND VERIFIED WITH: CALLED TO [name] @2150  [facility] 11/03/2018 Performed at [facility], [address] [place], [place] [address]   Expectorated sputum assessment w rflx to resp cult     Status: None   Collection Time: 11/05/18  5:07 PM  Result Value Ref Range Status   Specimen Description EXPECTORATED SPUTUM  Final   Special Requests Normal  Final   Sputum evaluation   Final    THIS SPECIMEN IS ACCEPTABLE FOR SPUTUM CULTURE Performed at [facility], [address] [place], [place] [address]    Report Status 11/05/2018 FINAL  Final  Culture, respiratory     Status: None   Collection Time: 11/05/18  5:07 PM  Result Value Ref Range Status   Specimen Description   Final    EXPECTORATED SPUTUM Performed at [facility], [address], [place], [place] [address]    Special Requests   Final    Normal Reflexed from [id number] Performed at [facility], [place], [place] [address]    Gram Stain   Final    NO WBC SEEN RARE GRAM POSITIVE COCCI RARE GRAM NEGATIVE RODS     Culture   Final    FEW Consistent with normal respiratory flora. Performed at [facility], [place] [address], [place],  [address]    Report Status 11/08/2018 FINAL  Final  Respiratory Panel by PCR     Status: None   Collection Time: 11/08/18 12:30 PM  Result Value Ref Range Status   Adenovirus NOT DETECTED NOT DETECTED Final   Coronavirus 229E NOT DETECTED NOT DETECTED Final    Comment: (NOTE) The Coronavirus on the Respiratory Panel, DOES NOT test for the novel  Coronavirus (2019 nCoV)  Coronavirus HKU1 NOT DETECTED NOT DETECTED Final   Coronavirus NL63 NOT DETECTED NOT DETECTED Final   Coronavirus OC43 NOT DETECTED NOT DETECTED Final   Metapneumovirus NOT DETECTED NOT DETECTED Final   Rhinovirus / Enterovirus NOT DETECTED NOT DETECTED Final   Influenza A NOT DETECTED NOT DETECTED Final   Influenza B NOT DETECTED NOT DETECTED Final   Parainfluenza Virus 1 NOT DETECTED NOT DETECTED Final   Parainfluenza Virus 2 NOT DETECTED NOT DETECTED Final   Parainfluenza Virus 3 NOT DETECTED NOT DETECTED Final   Parainfluenza Virus 4 NOT DETECTED NOT DETECTED Final   Respiratory Syncytial Virus NOT DETECTED NOT DETECTED Final   Bordetella pertussis NOT DETECTED NOT DETECTED Final   Chlamydophila pneumoniae NOT DETECTED NOT DETECTED Final   Mycoplasma pneumoniae NOT DETECTED NOT DETECTED Final    Comment: Performed at [facility], [place] [address] [place], [place] [address]      Scheduled Meds: . acetylcysteine  3 mL Nebulization BID  . amiodarone  200 mg Oral Daily  . amLODipine  10 mg Oral Daily  . apixaban  5 mg Oral BID  . aspirin  81 mg Oral Daily  . brimonidine  1 drop Both Eyes TID  . budesonide (PULMICORT) nebulizer solution  0.5 mg Nebulization BID  . cholecalciferol  1,000 Units Oral Daily  . docusate sodium  100 mg Oral BID  . doxycycline  100 mg Oral Q12H  . furosemide  40 mg Oral BID  . guaiFENesin  600 mg Oral BID  . insulin aspart  0-5 Units  Subcutaneous QHS  . insulin aspart  0-9 Units Subcutaneous TID WC  . ipratropium-albuterol  3 mL Nebulization Q6H  . latanoprost  1 drop Both Eyes QHS  . magnesium oxide  400 mg Oral BID  . mouth rinse  15 mL Mouth Rinse BID  . [START ON 11/12/2018] methylPREDNISolone (SOLU-MEDROL) injection  40 mg Intravenous Daily  . metoprolol  200 mg Oral Daily  . polyethylene glycol  17 g Oral Daily  . spironolactone  12.5 mg Oral Daily   Continuous Infusions: . sodium chloride 500 mL (11/09/18 0554)    Assessment/Plan:  1. Acute hypoxic respiratory failure.  With walking in the hallway pulse ox dropped down into the 70s.  Patient will need to wear his chronic oxygen all the time. 2. COPD exacerbation with pneumonia.  Patient finished cefepime and now on doxycycline.  Continue steroids and nebulizer treatments.  Viral respiratory panel negative.  Flu negative.  Since patient has upper airway transmitted wheeze and hoarse voice for a while I am wondering if Darius Taylor has vocal cord dysfunction or paralyzed vocal cord.  Spoke with ENT to evaluate this evening. 3. 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  Continue Lasix Toprol Aldactone.  This is more COPD exacerbation rather than heart failure at this time. 4. Chronic atrial fibrillation.  Has AICD.  Continue amiodarone Toprol and Eliquis 5. Chronic kidney disease stage III.  Continue to monitor closely. 6. 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  Code Status:     Code Status Orders  (From admission, onward)         Start     Ordered   11/03/18 2033  Full code  Continuous     11/03/18 2032        Code Status History    Date Active Date Inactive Code Status Order ID Comments User Context   08/30/2018 1045 09/02/2018 1616 Full Code [id number]  Fritzi Mandes,  MD Inpatient   08/22/2018 1435 08/25/2018 2257 Full Code [id number]  Fritzi Mandes, MD Inpatient   01/08/2018 1523 01/11/2018 1712 Full Code [id number]  Nicholes Mango, MD ED   11/18/2017 2227 11/23/2017  2033 Full Code [id number]  Vaughan Basta, MD Inpatient   10/21/2016 1216 10/26/2016 2247 Full Code [id number]  Bettey Costa, MD Inpatient      Disposition Plan: Hopefully will be able to go home tomorrow  Antibiotics:  Cefepime course finished  Doxycycline  Time spent: 28 minutes  Reader

## 2018-11-11 NOTE — Care Management Important Message (Signed)
Important Message  Patient Details  Name: Darius Taylor MRN: [id number] Date of Birth: Dec 09, 1945   Medicare Important Message Given:  Yes    Elza Rafter, RN 11/11/2018, 1:17 PM

## 2018-11-11 NOTE — Progress Notes (Signed)
Assuming care of pt.  Pt sleeping at this time. Dorna Bloom RN

## 2018-11-12 MED ORDER — PREDNISONE 10 MG PO TABS: ORAL_TABLET | ORAL | 0 refills | Status: DC

## 2018-11-12 MED ORDER — POLYETHYLENE GLYCOL 3350 17 G PO PACK: 17.0000 g | PACK | Freq: Every day | ORAL | 0 refills | Status: DC

## 2018-11-12 MED ORDER — ACETYLCYSTEINE 20 % IN SOLN: RESPIRATORY_TRACT | 12 refills | Status: DC

## 2018-11-12 MED ORDER — TIOTROPIUM BROMIDE MONOHYDRATE 18 MCG IN CAPS: 18.0000 ug | ORAL_CAPSULE | Freq: Every day | RESPIRATORY_TRACT | 0 refills | Status: DC

## 2018-11-12 MED ORDER — PREDNISONE 20 MG PO TABS
30.0000 mg | ORAL_TABLET | Freq: Once | ORAL | Status: AC
  Administered 2018-11-12: 30 mg via ORAL
  Filled 2018-11-12: qty 2

## 2018-11-12 MED ORDER — DIPHENHYDRAMINE HCL 25 MG PO CAPS
25.0000 mg | ORAL_CAPSULE | Freq: Three times a day (TID) | ORAL | Status: DC | PRN
  Administered 2018-11-12: 25 mg via ORAL
  Filled 2018-11-12: qty 1

## 2018-11-12 MED ORDER — BUDESONIDE-FORMOTEROL FUMARATE 80-4.5 MCG/ACT IN AERO: 2.0000 | INHALATION_SPRAY | Freq: Two times a day (BID) | RESPIRATORY_TRACT | 12 refills | Status: DC

## 2018-11-12 MED ORDER — DOXYCYCLINE HYCLATE 100 MG PO TABS: 100.0000 mg | ORAL_TABLET | Freq: Two times a day (BID) | ORAL | 0 refills | Status: DC

## 2018-11-12 MED ORDER — COLCHICINE 0.6 MG PO TABS: 0.6000 mg | ORAL_TABLET | Freq: Every day | ORAL | 11 refills | Status: DC

## 2018-11-12 NOTE — Discharge Instructions (Signed)
Community-Acquired Pneumonia, Adult °Pneumonia is an infection of the lungs. There are different types of pneumonia. One type can develop while a person is in a hospital. A different type, called community-acquired pneumonia, develops in people who are not, or have not recently been, in the hospital or other health care facility. °What are the causes? ° °Pneumonia may be caused by bacteria, viruses, or funguses. Community-acquired pneumonia is often caused by Streptococcus pneumonia bacteria. These bacteria are often passed from one person to another by breathing in droplets from the cough or sneeze of an infected person. °What increases the risk? °The condition is more likely to develop in: °· People who have chronic diseases, such as chronic obstructive pulmonary disease (COPD), asthma, congestive heart failure, cystic fibrosis, diabetes, or kidney disease. °· People who have early-stage or late-stage HIV. °· People who have sickle cell disease. °· People who have had their spleen removed (splenectomy). °· People who have poor dental hygiene. °· People who have medical conditions that increase the risk of breathing in (aspirating) secretions their own mouth and nose. °· People who have a weakened immune system (immunocompromised). °· People who smoke. °· People who travel to areas where pneumonia-causing germs commonly exist. °· People who are around animal habitats or animals that have pneumonia-causing germs, including birds, bats, rabbits, cats, and farm animals. °What are the signs or symptoms? °Symptoms of this condition include: °· A dry cough. °· A wet (productive) cough. °· Fever. °· Sweating. °· Chest pain, especially when breathing deeply or coughing. °· Rapid breathing or difficulty breathing. °· Shortness of breath. °· Shaking chills. °· Fatigue. °· Muscle aches. °How is this diagnosed? °Your health care provider will take a medical history and perform a physical exam. You may also have other tests,  including: °· Imaging studies of your chest, including X-rays. °· Tests to check your blood oxygen level and other blood gases. °· Other tests on blood, mucus (sputum), fluid around your lungs (pleural fluid), and urine. °If your pneumonia is severe, other tests may be done to identify the specific cause of your illness. °How is this treated? °The type of treatment that you receive depends on many factors, such as the cause of your pneumonia, the medicines you take, and other medical conditions that you have. For most adults, treatment and recovery from pneumonia may occur at home. In some cases, treatment must happen in a hospital. Treatment may include: °· Antibiotic medicines, if the pneumonia was caused by bacteria. °· Antiviral medicines, if the pneumonia was caused by a virus. °· Medicines that are given by mouth or through an IV tube. °· Oxygen. °· Respiratory therapy. °Although rare, treating severe pneumonia may include: °· Mechanical ventilation. This is done if you are not breathing well on your own and you cannot maintain a safe blood oxygen level. °· Thoracentesis. This procedure removes fluid around one lung or both lungs to help you breathe better. °Follow these instructions at home: ° °· Take over-the-counter and prescription medicines only as told by your health care provider. °? Only take cough medicine if you are losing sleep. Understand that cough medicine can prevent your body’s natural ability to remove mucus from your lungs. °? If you were prescribed an antibiotic medicine, take it as told by your health care provider. Do not stop taking the antibiotic even if you start to feel better. °· Sleep in a semi-upright position at night. Try sleeping in a reclining chair, or place a few pillows under your head. °· Do not use tobacco products, including cigarettes, chewing tobacco, and e-cigarettes.   If you need help quitting, ask your health care provider. °· Drink enough water to keep your urine  clear or pale yellow. This will help to thin out mucus secretions in your lungs. °How is this prevented? °There are ways that you can decrease your risk of developing community-acquired pneumonia. Consider getting a pneumococcal vaccine if: °· You are older than 75 years of age. °· You are older than 75 years of age and are undergoing cancer treatment, have chronic lung disease, or have other medical conditions that affect your immune system. Ask your health care provider if this applies to you. °There are different types and schedules of pneumococcal vaccines. Ask your health care provider which vaccination option is best for you. °You may also prevent community-acquired pneumonia if you take these actions: °· Get an influenza vaccine every year. Ask your health care provider which type of influenza vaccine is best for you. °· Go to the dentist on a regular basis. °· Wash your hands often. Use hand sanitizer if soap and water are not available. °Contact a health care provider if: °· You have a fever. °· You are losing sleep because you cannot control your cough with cough medicine. °Get help right away if: °· You have worsening shortness of breath. °· You have increased chest pain. °· Your sickness becomes worse, especially if you are an older adult or have a weakened immune system. °· You cough up blood. °This information is not intended to replace advice given to you by your health care provider. Make sure you discuss any questions you have with your health care provider. °Document Released: 09/10/2005 Document Revised: 05/30/2017 Document Reviewed: 01/05/2015 °Elsevier Interactive Patient Education © 2019 Elsevier Inc. ° °

## 2018-11-12 NOTE — Care Management Note (Signed)
Case Management Note  Patient Details  Name: Darius Taylor MRN: [id number] Date of Birth: 07-10-1946  Subjective/Objective:   Patient is discharging today.   Helene Kelp with [facility] aware to start [facility] services for RN, PT, ReDS Vest.                Action/Plan:   Expected Discharge Date:  11/12/18               Expected Discharge Plan:  [place]  In-House Referral:     Discharge planning Services  CM Consult  Post Acute Care Choice:    Choice offered to:  Patient  DME Arranged:    DME Agency:     HH Arranged:  RN, PT(ReDS Vest) [place]:  [facility] (now [facility])  Status of Service:  Completed, signed off  If discussed at [employer] of Stay Meetings, dates discussed:    Additional Comments:  Elza Rafter, RN 11/12/2018, 9:57 AM

## 2018-11-12 NOTE — Discharge Summary (Signed)
[place] [facility] [place] NAME: Darius Taylor    MR#:  [id number]  DATE OF BIRTH:  September 19, 1946  DATE OF ADMISSION:  11/03/2018 ADMITTING PHYSICIAN: Gladstone Lighter, MD  DATE OF DISCHARGE: 11/22/2018  PRIMARY CARE PHYSICIAN: [facility] [place]    ADMISSION DIAGNOSIS:  Acute respiratory failure with hypoxia ([place]) [J96.01] HCAP (healthcare-associated pneumonia) [J18.9]  DISCHARGE DIAGNOSIS:  Active Problems:   CHF exacerbation ([place])   SECONDARY DIAGNOSIS:   Past Medical History:  Diagnosis Date  . Aortic stenosis   . Arrhythmia    atrial fibrillation  . Asthma   . Atrial fibrillation ([place])   . CAD (coronary artery disease)   . Cancer [facility])    colon, bladder and kidney  . CHF (congestive heart failure) ([place])   . 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 COPD (chronic obstructive pulmonary disease) ([place])   . H/O ventricular tachycardia   . Hypertension   . 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 Renal cell carcinoma ([place])     HOSPITAL COURSE:   1.  Acute hypoxic respiratory failure.  This is now chronic respiratory failure.  The patient has oxygen at home he just does not wear it.  He was advised to wear his oxygen at home because his pulse ox dropped down into the 70s with ambulation on room air. 2.  COPD exacerbation with pneumonia.  The patient finished cefepime course here in the hospital.  Now on doxycycline and will finish up that course.  The patient will be a slower taper on steroids since she was on over a week of steroids while being here.  Viral respiratory panel negative.  Flu panel negative.  The patient does have upper airway wheeze.  When he breathes with his mouth wide open his lungs are clear.  ENT evaluated his vocal cords and they look normal.  We will get pulmonary to follow-up as outpatient.  Patient has nebulizers and albuterol inhaler at home.  Prescribed Symbicort and Spiriva.  I  prescribed Mucomyst nebulizer here in the hospital and patient states that if his insurance will cover it he will use it at home to try to get up the rest that phlegm. 3.  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  Patient is on Lasix, Toprol and Aldactone.  No ACE inhibitor at this time secondary to acute kidney injury. 4.  Chronic atrial fibrillation.  Patient is on amiodarone Toprol and Eliquis 5.  Acute kidney injury on chronic kidney disease stage III.  Creatinine peaked at 1.92 and came down to 1.40. 6.  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 7.  Impaired fasting glucose.  Last hemoglobin A1c 6.2.  Patient not a diabetic.  Sugars are elevated secondary to steroids.  Patient refused insulin while here in the hospital.  We will try to taper off steroids quickly.  DISCHARGE CONDITIONS:   [place]  CONSULTS OBTAINED:  Treatment Team:  Beverly Gust, MD  DRUG ALLERGIES:  No Known Allergies  DISCHARGE MEDICATIONS:   Allergies as of 11/12/2018   No Known Allergies     Medication List    STOP taking these medications   allopurinol 100 MG tablet Commonly known as:  ZYLOPRIM   atorvastatin 80 MG tablet Commonly known as:  LIPITOR     TAKE these medications   acetaminophen 325 MG tablet Commonly known as:  TYLENOL Take 650 mg by mouth every 6 (six) hours as needed for  mild pain or fever.   acetylcysteine 20 % nebulizer solution Commonly known as:  MUCOMYST 17ml nebulizer twice a day  Dx:j44.1   albuterol 108 (90 Base) MCG/ACT inhaler Commonly known as:  PROVENTIL HFA;VENTOLIN HFA Inhale 1-2 puffs into the lungs every 6 (six) hours as needed for wheezing or shortness of breath.   amiodarone 200 MG tablet Commonly known as:  PACERONE Take 200 mg by mouth daily.   amLODipine 10 MG tablet Commonly known as:  NORVASC Take 10 mg by mouth daily.   aspirin 81 MG chewable tablet Chew 81 mg by mouth daily.   brimonidine 0.15 % ophthalmic solution Commonly known as:   ALPHAGAN Place 1 drop into both eyes 3 (three) times daily.   budesonide-formoterol 80-4.5 MCG/ACT inhaler Commonly known as:  SYMBICORT Inhale 2 puffs into the lungs 2 (two) times daily.   cholecalciferol 1000 units tablet Commonly known as:  VITAMIN D Take 1,000 Units by mouth daily.   colchicine 0.6 MG tablet Take 1 tablet (0.6 mg total) by mouth daily.   doxycycline 100 MG tablet Commonly known as:  VIBRA-TABS Take 1 tablet (100 mg total) by mouth every 12 (twelve) hours.   ELIQUIS 5 MG Tabs tablet Generic drug:  apixaban Take 5 mg by mouth 2 (two) times daily.   furosemide 40 MG tablet Commonly known as:  LASIX Take 40 mg by mouth See admin instructions. Take 1 tablet (40mg ) by mouth twice daily and 1 additional tablet (40mg ) by mouth if needed for excessive swelling or edema   guaiFENesin 100 MG/5ML Soln Commonly known as:  ROBITUSSIN Take 5 mLs (100 mg total) by mouth every 4 (four) hours as needed for cough or to loosen phlegm.   latanoprost 0.005 % ophthalmic solution Commonly known as:  XALATAN Place 1 drop into both eyes at bedtime.   magnesium oxide 400 MG tablet Commonly known as:  MAG-OX Take 400 mg by mouth 2 (two) times daily.   metoprolol 200 MG 24 hr tablet Commonly known as:  TOPROL-XL Take 200 mg by mouth daily.   nitroGLYCERIN 0.4 MG SL tablet Commonly known as:  NITROSTAT Place 0.4 mg under the tongue every 5 (five) minutes as needed for chest pain.   polyethylene glycol packet Commonly known as:  MIRALAX / GLYCOLAX Take 17 g by mouth daily.   predniSONE 10 MG tablet Commonly known as:  DELTASONE 3 tabs po day1; 2 tabs po day2,3; 1 tab po day4,5; 1/2 tab po day6,7 then stop   spironolactone 25 MG tablet Commonly known as:  ALDACTONE Take 12.5 mg by mouth daily.   tiotropium 18 MCG inhalation capsule Commonly known as:  SPIRIVA HANDIHALER Place 1 capsule (18 mcg total) into inhaler and inhale daily for 30 days.        DISCHARGE  INSTRUCTIONS:   Follow-up PMD 5 days Follow-up pulmonology in a few weeks Can follow-up with ENT Dr. Aundra Dubin as outpatient  If you experience worsening of your admission symptoms, develop shortness of breath, life threatening emergency, suicidal or homicidal thoughts you must seek medical attention immediately by calling 911 or calling your MD immediately  if symptoms less severe.  You Must read complete instructions/literature along with all the possible adverse reactions/side effects for all the Medicines you take and that have been prescribed to you. Take any new Medicines after you have completely understood and accept all the possible adverse reactions/side effects.   Please note  You were cared for by a hospitalist during your  hospital stay. If you have any questions about your discharge medications or the care you received while you were in the hospital after you are discharged, you can call the unit and asked to speak with the hospitalist on call if the hospitalist that took care of you is not available. Once you are discharged, your primary care physician will handle any further medical issues. Please note that NO REFILLS for any discharge medications will be authorized once you are discharged, as it is imperative that you return to your primary care physician (or establish a relationship with a primary care physician if you do not have one) for your aftercare needs so that they can reassess your need for medications and monitor your lab values.    Today   CHIEF COMPLAINT:   Chief Complaint  Patient presents with  . Shortness of Breath    HISTORY OF PRESENT ILLNESS:  Darius Taylor  is a 73 y.o. male presented with shortness of breath   VITAL SIGNS:  Blood pressure 136/78, pulse 60, temperature 98.2 F (36.8 C), temperature source Oral, resp. rate 20, height 5\' 11"  (1.803 m), weight 93.4 kg, SpO2 99 %.   PHYSICAL EXAMINATION:  GENERAL:  73 y.o.-year-old patient lying in the  bed with no acute distress.  EYES: Pupils equal, round, reactive to light and accommodation. No scleral icterus. Extraocular muscles intact.  HEENT: Head atraumatic, normocephalic. Oropharynx and nasopharynx clear.  NECK:  Supple, no jugular venous distention. No thyroid enlargement, no tenderness.  LUNGS: Normal breath sounds bilaterally, no wheezing when he breathes with his mouth wide open.  When he has his mouth closed audible wheeze heard.  This is a transmitted wheeze from the upper airway.  No rales,rhonchi or crepitation. No use of accessory muscles of respiration.  CARDIOVASCULAR: S1, S2 normal. No murmurs, rubs, or gallops.  ABDOMEN: Soft, non-tender, non-distended. Bowel sounds present. No organomegaly or mass.  EXTREMITIES: No pedal edema, cyanosis, or clubbing.  NEUROLOGIC: Cranial nerves II through XII are intact. Muscle strength 5/5 in all extremities. Sensation intact. Gait not checked.  PSYCHIATRIC: The patient is alert and oriented x 3.  SKIN: No obvious rash, lesion, or ulcer.   DATA REVIEW:   CBC Recent Labs  Lab 11/10/18 0426  WBC 17.7*  HGB 11.2*  HCT 35.3*  PLT 298    Chemistries  Recent Labs  Lab 11/11/18 0459  NA 136  K 4.7  CL 100  CO2 30  GLUCOSE 289*  BUN 75*  CREATININE 1.40*  CALCIUM 8.5*     Microbiology Results  Results for orders placed or performed during the hospital encounter of 11/03/18  Blood culture (routine x 2)     Status: None   Collection Time: 11/03/18  4:37 PM  Result Value Ref Range Status   Specimen Description BLOOD RIGHT ANTECUBITAL  Final   Special Requests   Final    BOTTLES DRAWN AEROBIC AND ANAEROBIC Blood Culture results may not be optimal due to an excessive volume of blood received in culture bottles   Culture   Final    NO GROWTH 5 DAYS Performed at [facility], [address], [place], [place] [address]    Report Status 11/08/2018 FINAL  Final  Blood culture (routine x 2)     Status: None    Collection Time: 11/03/18  4:37 PM  Result Value Ref Range Status   Specimen Description BLOOD BLOOD LEFT WRIST  Final   Special Requests   Final  BOTTLES DRAWN AEROBIC AND ANAEROBIC Blood Culture adequate volume   Culture   Final    NO GROWTH 5 DAYS Performed at [facility], [address] [place], [place] [address]    Report Status 11/08/2018 FINAL  Final  MRSA PCR Screening     Status: Abnormal   Collection Time: 11/03/18  6:23 PM  Result Value Ref Range Status   MRSA by PCR POSITIVE (A) NEGATIVE Final    Comment:        The GeneXpert MRSA Assay (FDA approved for NASAL specimens only), is one component of a comprehensive MRSA colonization surveillance program. It is not intended to diagnose MRSA infection nor to guide or monitor treatment for MRSA infections. CRITICAL RESULT CALLED TO, READ BACK BY AND VERIFIED WITH: CALLED TO [name] @2150  [facility] 11/03/2018 Performed at [facility], [name] [place], [place] [address]   Expectorated sputum assessment w rflx to resp cult     Status: None   Collection Time: 11/05/18  5:07 PM  Result Value Ref Range Status   Specimen Description EXPECTORATED SPUTUM  Final   Special Requests Normal  Final   Sputum evaluation   Final    THIS SPECIMEN IS ACCEPTABLE FOR SPUTUM CULTURE Performed at [facility], [address], [place], [place] [address]    Report Status 11/05/2018 FINAL  Final  Culture, respiratory     Status: None   Collection Time: 11/05/18  5:07 PM  Result Value Ref Range Status   Specimen Description   Final    EXPECTORATED SPUTUM Performed at [facility], [address], [place], [place] [address]    Special Requests   Final    Normal Reflexed from [id number] Performed at [facility], [place], [place] [address]    Gram Stain   Final    NO WBC SEEN RARE GRAM POSITIVE COCCI RARE GRAM NEGATIVE RODS    Culture   Final    FEW Consistent  with normal respiratory flora. Performed at [facility], [place] [address], [place], [place] [address]    Report Status 11/08/2018 FINAL  Final  Respiratory Panel by PCR     Status: None   Collection Time: 11/08/18 12:30 PM  Result Value Ref Range Status   Adenovirus NOT DETECTED NOT DETECTED Final   Coronavirus 229E NOT DETECTED NOT DETECTED Final    Comment: (NOTE) The Coronavirus on the Respiratory Panel, DOES NOT test for the novel  Coronavirus (2019 nCoV)    Coronavirus HKU1 NOT DETECTED NOT DETECTED Final   Coronavirus NL63 NOT DETECTED NOT DETECTED Final   Coronavirus OC43 NOT DETECTED NOT DETECTED Final   Metapneumovirus NOT DETECTED NOT DETECTED Final   Rhinovirus / Enterovirus NOT DETECTED NOT DETECTED Final   Influenza A NOT DETECTED NOT DETECTED Final   Influenza B NOT DETECTED NOT DETECTED Final   Parainfluenza Virus 1 NOT DETECTED NOT DETECTED Final   Parainfluenza Virus 2 NOT DETECTED NOT DETECTED Final   Parainfluenza Virus 3 NOT DETECTED NOT DETECTED Final   Parainfluenza Virus 4 NOT DETECTED NOT DETECTED Final   Respiratory Syncytial Virus NOT DETECTED NOT DETECTED Final   Bordetella pertussis NOT DETECTED NOT DETECTED Final   Chlamydophila pneumoniae NOT DETECTED NOT DETECTED Final   Mycoplasma pneumoniae NOT DETECTED NOT DETECTED Final    Comment: Performed at [facility], [place]. [address], [place], [place] [address]    RADIOLOGY:  Ct Chest Wo Contrast  Result Date: 11/10/2018 CLINICAL DATA:  Respiratory failure.  Hypoxia.  COPD exacerbation EXAM: CT CHEST WITHOUT CONTRAST TECHNIQUE: Multidetector CT imaging of the chest was performed following the standard protocol without IV contrast. COMPARISON:  Chest radiograph, 11/05/2018. FINDINGS: Cardiovascular: Heart mildly enlarged. No pericardial effusion. Dense 2 vessel coronary artery calcifications. Status post endovascular aortic valve replacement. Aorta is normal caliber. There are extensive aortic  atherosclerotic calcifications. Prominent main pulmonary artery measuring 3.8 cm. Mediastinum/Nodes: No neck base, axillary, mediastinal or hilar masses or enlarged lymph nodes. Trachea and esophagus are unremarkable. Lungs/Pleura: Numerous bilateral ill-defined centrilobular ground-glass nodular opacities. These have and upper lobe predominance. There is bronchial wall thickening and dependent atelectasis in the lower lobes. Dependent atelectasis noted in the left upper lobe lingula. Mild linear atelectasis in the right middle lobe. No evidence of pulmonary edema. No pleural effusion or pneumothorax. Upper Abdomen: 2.2 cm low-density right liver lobe lesion, likely a cyst. No acute findings in the visualized upper abdomen. Musculoskeletal: Mild loss of height 3 contiguous mid to lower thoracic vertebra, which appears chronic. No convincing acute fracture. No osteoblastic or osteolytic lesions. Left anterior chest wall 2 lead pacemaker. Leads are positioned within the right atrium and right ventricle. IMPRESSION: 1. Numerous bilateral small centrilobular ground-glass nodular opacities that are more prominent in the upper lobes. Findings are consistent with diffuse infection or inflammation. 2. No evidence of pulmonary edema. 3. Mild basilar atelectasis. Lower lobe bronchial wall thickening that may be chronic or reflect acute bronchitis. 4. Cardiomegaly with coronary artery calcifications and changes from an aortic valve replacement. 5. Aortic atherosclerosis. Aortic Atherosclerosis (ICD10-I70.0). Electronically Signed   By: Lajean Manes M.D.   On: 11/10/2018 17:31     Management plans discussed with the patient, and he is agreement.  Left message for the daughter on the phone.  CODE STATUS:     Code Status Orders  (From admission, onward)         Start     Ordered   11/03/18 2033  Full code  Continuous     11/03/18 2032        Code Status History    Date Active Date Inactive Code Status Order  ID Comments User Context   08/30/2018 1045 09/02/2018 1616 Full Code [id number]  Fritzi Mandes, MD Inpatient   08/22/2018 1435 08/25/2018 2257 Full Code [id number]  Fritzi Mandes, MD Inpatient   01/08/2018 1523 01/11/2018 1712 Full Code [id number]  Nicholes Mango, MD ED   11/18/2017 2227 11/23/2017 2033 Full Code [id number]  Vaughan Basta, MD Inpatient   10/21/2016 1216 10/26/2016 2247 Full Code [id number]  Bettey Costa, MD Inpatient      TOTAL TIME TAKING CARE OF THIS PATIENT: 35 minutes.    [name] M.D on 11/12/2018 at 2:38 PM  Between 7am to 6pm - Pager - [phone]  After 6pm go to [email or web address] - password [email or web address]  [facility] Office  [phone]  CC: Primary care physician; [employer]

## 2018-11-12 NOTE — Progress Notes (Signed)
Discharge instructions explained to pt and pts daughter/ verbalized an understanding/ iv and tele removed/ transported off unit via wheelchair with home o2

## 2018-11-17 ENCOUNTER — Ambulatory Visit: Payer: Medicare HMO | Admitting: Family

## 2018-11-26 ENCOUNTER — Institutional Professional Consult (permissible substitution): Payer: Medicare HMO | Admitting: Pulmonary Disease

## 2018-12-29 ENCOUNTER — Emergency Department: Payer: Medicare HMO

## 2018-12-29 ENCOUNTER — Other Ambulatory Visit: Payer: Self-pay

## 2018-12-29 ENCOUNTER — Encounter: Payer: Self-pay | Admitting: Emergency Medicine

## 2018-12-29 ENCOUNTER — Inpatient Hospital Stay
Admission: EM | Admit: 2018-12-29 | Discharge: 2019-01-21 | DRG: 682 | Disposition: A | Discharge status: Home Health | Payer: Medicare HMO | Attending: Internal Medicine | Admitting: Internal Medicine

## 2018-12-29 DIAGNOSIS — I350 Nonrheumatic aortic (valve) stenosis: Secondary | ICD-10-CM | POA: Diagnosis present

## 2018-12-29 DIAGNOSIS — Z7951 Long term (current) use of inhaled steroids: Secondary | ICD-10-CM

## 2018-12-29 DIAGNOSIS — I428 Other cardiomyopathies: Secondary | ICD-10-CM | POA: Diagnosis present

## 2018-12-29 DIAGNOSIS — I501 Left ventricular failure: Secondary | ICD-10-CM

## 2018-12-29 DIAGNOSIS — Z20828 Contact with and (suspected) exposure to other viral communicable diseases: Secondary | ICD-10-CM | POA: Diagnosis present

## 2018-12-29 DIAGNOSIS — E1122 Type 2 diabetes mellitus with diabetic chronic kidney disease: Secondary | ICD-10-CM | POA: Diagnosis present

## 2018-12-29 DIAGNOSIS — Z8551 Personal history of malignant neoplasm of bladder: Secondary | ICD-10-CM

## 2018-12-29 DIAGNOSIS — F10920 Alcohol use, unspecified with intoxication, uncomplicated: Secondary | ICD-10-CM | POA: Diagnosis present

## 2018-12-29 DIAGNOSIS — R531 Weakness: Secondary | ICD-10-CM | POA: Diagnosis not present

## 2018-12-29 DIAGNOSIS — Z8249 Family history of ischemic heart disease and other diseases of the circulatory system: Secondary | ICD-10-CM

## 2018-12-29 DIAGNOSIS — J9690 Respiratory failure, unspecified, unspecified whether with hypoxia or hypercapnia: Secondary | ICD-10-CM

## 2018-12-29 DIAGNOSIS — I272 Pulmonary hypertension, unspecified: Secondary | ICD-10-CM | POA: Diagnosis present

## 2018-12-29 DIAGNOSIS — N179 Acute kidney failure, unspecified: Secondary | ICD-10-CM | POA: Diagnosis present

## 2018-12-29 DIAGNOSIS — N189 Chronic kidney disease, unspecified: Secondary | ICD-10-CM

## 2018-12-29 DIAGNOSIS — Z952 Presence of prosthetic heart valve: Secondary | ICD-10-CM

## 2018-12-29 DIAGNOSIS — Z9049 Acquired absence of other specified parts of digestive tract: Secondary | ICD-10-CM

## 2018-12-29 DIAGNOSIS — J440 Chronic obstructive pulmonary disease with acute lower respiratory infection: Secondary | ICD-10-CM | POA: Diagnosis present

## 2018-12-29 DIAGNOSIS — N183 Chronic kidney disease, stage 3 (moderate): Secondary | ICD-10-CM | POA: Diagnosis present

## 2018-12-29 DIAGNOSIS — J9600 Acute respiratory failure, unspecified whether with hypoxia or hypercapnia: Secondary | ICD-10-CM

## 2018-12-29 DIAGNOSIS — J449 Chronic obstructive pulmonary disease, unspecified: Secondary | ICD-10-CM

## 2018-12-29 DIAGNOSIS — Z85528 Personal history of other malignant neoplasm of kidney: Secondary | ICD-10-CM

## 2018-12-29 DIAGNOSIS — Y906 Blood alcohol level of 120-199 mg/100 ml: Secondary | ICD-10-CM | POA: Diagnosis present

## 2018-12-29 DIAGNOSIS — D638 Anemia in other chronic diseases classified elsewhere: Secondary | ICD-10-CM | POA: Diagnosis present

## 2018-12-29 DIAGNOSIS — I158 Other secondary hypertension: Secondary | ICD-10-CM | POA: Diagnosis present

## 2018-12-29 DIAGNOSIS — Z7901 Long term (current) use of anticoagulants: Secondary | ICD-10-CM

## 2018-12-29 DIAGNOSIS — Z95810 Presence of automatic (implantable) cardiac defibrillator: Secondary | ICD-10-CM

## 2018-12-29 DIAGNOSIS — Z823 Family history of stroke: Secondary | ICD-10-CM

## 2018-12-29 DIAGNOSIS — J189 Pneumonia, unspecified organism: Secondary | ICD-10-CM

## 2018-12-29 DIAGNOSIS — Z79899 Other long term (current) drug therapy: Secondary | ICD-10-CM

## 2018-12-29 DIAGNOSIS — Z87891 Personal history of nicotine dependence: Secondary | ICD-10-CM

## 2018-12-29 DIAGNOSIS — I130 Hypertensive heart and chronic kidney disease with heart failure and stage 1 through stage 4 chronic kidney disease, or unspecified chronic kidney disease: Secondary | ICD-10-CM | POA: Diagnosis present

## 2018-12-29 DIAGNOSIS — E860 Dehydration: Secondary | ICD-10-CM | POA: Diagnosis present

## 2018-12-29 DIAGNOSIS — J9621 Acute and chronic respiratory failure with hypoxia: Secondary | ICD-10-CM | POA: Diagnosis present

## 2018-12-29 DIAGNOSIS — F10120 Alcohol abuse with intoxication, uncomplicated: Secondary | ICD-10-CM | POA: Diagnosis present

## 2018-12-29 DIAGNOSIS — J441 Chronic obstructive pulmonary disease with (acute) exacerbation: Secondary | ICD-10-CM | POA: Diagnosis present

## 2018-12-29 DIAGNOSIS — Z85038 Personal history of other malignant neoplasm of large intestine: Secondary | ICD-10-CM

## 2018-12-29 DIAGNOSIS — I10 Essential (primary) hypertension: Secondary | ICD-10-CM | POA: Diagnosis present

## 2018-12-29 DIAGNOSIS — I5032 Chronic diastolic (congestive) heart failure: Secondary | ICD-10-CM

## 2018-12-29 DIAGNOSIS — I2510 Atherosclerotic heart disease of native coronary artery without angina pectoris: Secondary | ICD-10-CM | POA: Diagnosis present

## 2018-12-29 DIAGNOSIS — I5043 Acute on chronic combined systolic (congestive) and diastolic (congestive) heart failure: Secondary | ICD-10-CM | POA: Diagnosis present

## 2018-12-29 DIAGNOSIS — R195 Other fecal abnormalities: Secondary | ICD-10-CM | POA: Diagnosis not present

## 2018-12-29 DIAGNOSIS — Z905 Acquired absence of kidney: Secondary | ICD-10-CM

## 2018-12-29 DIAGNOSIS — I480 Paroxysmal atrial fibrillation: Secondary | ICD-10-CM | POA: Diagnosis present

## 2018-12-29 DIAGNOSIS — R0602 Shortness of breath: Secondary | ICD-10-CM

## 2018-12-29 DIAGNOSIS — K5900 Constipation, unspecified: Secondary | ICD-10-CM | POA: Diagnosis not present

## 2018-12-29 DIAGNOSIS — R300 Dysuria: Secondary | ICD-10-CM | POA: Diagnosis present

## 2018-12-29 DIAGNOSIS — Y95 Nosocomial condition: Secondary | ICD-10-CM | POA: Diagnosis not present

## 2018-12-29 DIAGNOSIS — R112 Nausea with vomiting, unspecified: Secondary | ICD-10-CM

## 2018-12-29 LAB — URINALYSIS, COMPLETE (UACMP) WITH MICROSCOPIC
Bacteria, UA: NONE SEEN
Bilirubin Urine: NEGATIVE
Glucose, UA: NEGATIVE mg/dL
Ketones, ur: NEGATIVE mg/dL
Leukocytes,Ua: NEGATIVE
Nitrite: NEGATIVE
Protein, ur: NEGATIVE mg/dL
Specific Gravity, Urine: 1.013 (ref 1.005–1.030)
pH: 5 (ref 5.0–8.0)

## 2018-12-29 LAB — BASIC METABOLIC PANEL
Anion gap: 17 — ABNORMAL HIGH (ref 5–15)
Anion gap: 16 — ABNORMAL HIGH (ref 5–15)
BUN: 55 mg/dL — ABNORMAL HIGH (ref 8–23)
BUN: 54 mg/dL — ABNORMAL HIGH (ref 8–23)
CO2: 21 mmol/L — ABNORMAL LOW (ref 22–32)
CO2: 22 mmol/L (ref 22–32)
Calcium: 8.9 mg/dL (ref 8.9–10.3)
Calcium: 8.5 mg/dL — ABNORMAL LOW (ref 8.9–10.3)
Chloride: 101 mmol/L (ref 98–111)
Chloride: 103 mmol/L (ref 98–111)
Creatinine, Ser: 2.12 mg/dL — ABNORMAL HIGH (ref 0.61–1.24)
Creatinine, Ser: 2.15 mg/dL — ABNORMAL HIGH (ref 0.61–1.24)
GFR calc Af Amer: 34 mL/min — ABNORMAL LOW (ref >60)
GFR calc Af Amer: 34 mL/min — ABNORMAL LOW (ref >60)
GFR calc non Af Amer: 30 mL/min — ABNORMAL LOW (ref >60)
GFR calc non Af Amer: 29 mL/min — ABNORMAL LOW (ref >60)
Glucose, Bld: 103 mg/dL — ABNORMAL HIGH (ref 70–99)
Glucose, Bld: 98 mg/dL (ref 70–99)
Potassium: 3.8 mmol/L (ref 3.5–5.1)
Potassium: 3.8 mmol/L (ref 3.5–5.1)
Sodium: 139 mmol/L (ref 135–145)
Sodium: 141 mmol/L (ref 135–145)

## 2018-12-29 LAB — URINE DRUG SCREEN, QUALITATIVE (ARMC ONLY)
Amphetamines, Ur Screen: NOT DETECTED
Barbiturates, Ur Screen: NOT DETECTED
Benzodiazepine, Ur Scrn: NOT DETECTED
Cannabinoid 50 Ng, Ur ~~LOC~~: NOT DETECTED
Cocaine Metabolite,Ur ~~LOC~~: NOT DETECTED
MDMA (Ecstasy)Ur Screen: NOT DETECTED
Methadone Scn, Ur: NOT DETECTED
Opiate, Ur Screen: NOT DETECTED
Phencyclidine (PCP) Ur S: NOT DETECTED
Tricyclic, Ur Screen: NOT DETECTED

## 2018-12-29 LAB — CBC WITH DIFFERENTIAL/PLATELET
Abs Immature Granulocytes: 0.05 10*3/uL (ref 0.00–0.07)
Basophils Absolute: 0.1 10*3/uL (ref 0.0–0.1)
Basophils Relative: 1 %
Eosinophils Absolute: 0 10*3/uL (ref 0.0–0.5)
Eosinophils Relative: 1 %
HCT: 37.2 % — ABNORMAL LOW (ref 39.0–52.0)
Hemoglobin: 12.2 g/dL — ABNORMAL LOW (ref 13.0–17.0)
Immature Granulocytes: 1 %
Lymphocytes Relative: 17 %
Lymphs Abs: 1.3 10*3/uL (ref 0.7–4.0)
MCH: 30.4 pg (ref 26.0–34.0)
MCHC: 32.8 g/dL (ref 30.0–36.0)
MCV: 92.8 fL (ref 80.0–100.0)
Monocytes Absolute: 0.6 10*3/uL (ref 0.1–1.0)
Monocytes Relative: 8 %
Neutro Abs: 6 10*3/uL (ref 1.7–7.7)
Neutrophils Relative %: 72 %
Platelets: 239 10*3/uL (ref 150–400)
RBC: 4.01 MIL/uL — ABNORMAL LOW (ref 4.22–5.81)
RDW: 14.6 % (ref 11.5–15.5)
WBC: 8.1 10*3/uL (ref 4.0–10.5)
nRBC: 0 % (ref 0.0–0.2)

## 2018-12-29 LAB — CBC
HCT: 37.5 % — ABNORMAL LOW (ref 39.0–52.0)
Hemoglobin: 12.1 g/dL — ABNORMAL LOW (ref 13.0–17.0)
MCH: 29.9 pg (ref 26.0–34.0)
MCHC: 32.3 g/dL (ref 30.0–36.0)
MCV: 92.6 fL (ref 80.0–100.0)
Platelets: 247 10*3/uL (ref 150–400)
RBC: 4.05 MIL/uL — ABNORMAL LOW (ref 4.22–5.81)
RDW: 14.7 % (ref 11.5–15.5)
WBC: 8.5 10*3/uL (ref 4.0–10.5)
nRBC: 0 % (ref 0.0–0.2)

## 2018-12-29 LAB — HEPATIC FUNCTION PANEL
ALT: 130 U/L — ABNORMAL HIGH (ref 0–44)
AST: 160 U/L — ABNORMAL HIGH (ref 15–41)
Albumin: 4.2 g/dL (ref 3.5–5.0)
Alkaline Phosphatase: 97 U/L (ref 38–126)
Bilirubin, Direct: 0.2 mg/dL (ref 0.0–0.2)
Indirect Bilirubin: 0.9 mg/dL (ref 0.3–0.9)
Total Bilirubin: 1.1 mg/dL (ref 0.3–1.2)
Total Protein: 8.3 g/dL — ABNORMAL HIGH (ref 6.5–8.1)

## 2018-12-29 LAB — ETHANOL: Alcohol, Ethyl (B): 122 mg/dL — ABNORMAL HIGH (ref <10)

## 2018-12-29 LAB — TROPONIN I: Troponin I: 0.13 ng/mL (ref <0.03)

## 2018-12-29 LAB — BRAIN NATRIURETIC PEPTIDE: B Natriuretic Peptide: 271 pg/mL — ABNORMAL HIGH (ref 0.0–100.0)

## 2018-12-29 MED ORDER — SODIUM CHLORIDE 0.9 % IV BOLUS
1000.0000 mL | Freq: Once | INTRAVENOUS | Status: AC
  Administered 2018-12-29: 1000 mL via INTRAVENOUS

## 2018-12-29 MED ORDER — THIAMINE HCL 100 MG/ML IJ SOLN: Freq: Once | INTRAVENOUS | Status: DC

## 2018-12-29 NOTE — ED Notes (Signed)
Date and time results received: 12/29/18 2217 (use smartphrase ".now" to insert current time)  Test: troponin Critical Value: 0.13  Name of Provider Notified: Cinda Quest MD  Orders Received? Or Actions Taken?: Orders Received - See Orders for details

## 2018-12-29 NOTE — ED Provider Notes (Addendum)
[facility] Emergency Department Provider Note   ____________________________________________   First MD Initiated Contact with Patient 12/29/18 2044     (approximate)  I have reviewed the triage vital signs and the nursing notes.   HISTORY  Chief Complaint Weakness  HPI Darius Taylor is a 73 y.o. male comes in complaining of weakness.  EMS arrived and his blood pressure was in the 60s and O2 sat was in the 88 range.  He does have COPD and does use oxygen as needed.  Patient initially denied using any alcohol but later told the nurse she drank from a half a pint to a pint of alcohol yesterday.  He does still smell with alcohol.  He denies any headache nausea vomiting diarrhea chest pain abdominal pain or anything but some weakness.  He says he is slightly short of breath.  O2 sats in the ER 88% on room air.         Past Medical History:  Diagnosis Date  . Aortic stenosis   . Arrhythmia    atrial fibrillation  . Asthma   . Atrial fibrillation ([place])   . CAD (coronary artery disease)   . Cancer [facility])    colon, bladder and kidney  . CHF (congestive heart failure) ([place])   . 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 COPD (chronic obstructive pulmonary disease) ([place])   . H/O ventricular tachycardia   . Hypertension   . Nonischemic cardiomyopathy ([place])    Status post AICD and pacemaker  . Renal cell carcinoma [facility])     Patient Active Problem List   Diagnosis Date Noted  . CHF exacerbation ([place]) 11/03/2018  . Chronic diastolic heart failure ([place]) 09/24/2018  . HTN (hypertension) 09/24/2018  . Acute on chronic respiratory failure with hypoxemia ([place]) 08/30/2018  . History of colon cancer   . Cellulitis 11/18/2017  . Hematoma     Past Surgical History:  Procedure Laterality Date  . CARDIAC DEFIBRILLATOR PLACEMENT    . CARDIAC DEFIBRILLATOR PLACEMENT    . COLONOSCOPY WITH PROPOFOL N/A 06/10/2018   Procedure: COLONOSCOPY WITH PROPOFOL;  Surgeon: Lin Landsman, MD;  Location: [facility] ENDOSCOPY;  Service: Gastroenterology;  Laterality: N/A;  . CORONARY ANGIOPLASTY  09/25/2016  . IRRIGATION AND DEBRIDEMENT HEMATOMA Left 10/24/2016   Procedure: IRRIGATION AND DEBRIDEMENT HEMATOMA;  Surgeon: Florene Glen, MD;  Location: [facility] ORS;  Service: General;  Laterality: Left;  . KIDNEY SURGERY     left ne[hrectomy for RCC  . PACEMAKER IMPLANT    . PARTIAL COLECTOMY      Prior to Admission medications   Medication Sig Start Date End Date Taking? Authorizing Provider  acetaminophen (TYLENOL) 325 MG tablet Take 650 mg by mouth every 6 (six) hours as needed for mild pain or fever.     [provider]  acetylcysteine (MUCOMYST) 20 % nebulizer solution 78ml nebulizer twice a day  Dx:j44.1 11/12/18   [name], MD  albuterol (PROVENTIL HFA;VENTOLIN HFA) 108 (90 Base) MCG/ACT inhaler Inhale 1-2 puffs into the lungs every 6 (six) hours as needed for wheezing or shortness of breath.    [provider]  amiodarone (PACERONE) 200 MG tablet Take 200 mg by mouth daily.    [provider]  amLODipine (NORVASC) 10 MG tablet Take 10 mg by mouth daily.    [provider]  apixaban (ELIQUIS) 5 MG TABS tablet Take 5 mg by mouth 2 (two) times daily.    [provider]  aspirin  81 MG chewable tablet Chew 81 mg by mouth daily.    [provider]  brimonidine (ALPHAGAN) 0.15 % ophthalmic solution Place 1 drop into both eyes 3 (three) times daily. 12/20/17   [provider]  budesonide-formoterol (SYMBICORT) 80-4.5 MCG/ACT inhaler Inhale 2 puffs into the lungs 2 (two) times daily. 11/12/18   [name], MD  cholecalciferol (VITAMIN D) 1000 units tablet Take 1,000 Units by mouth daily.    [provider]  colchicine 0.6 MG tablet Take 1 tablet (0.6 mg total) by mouth daily. 11/12/18 11/12/19  [name], MD  doxycycline (VIBRA-TABS) 100 MG tablet Take 1 tablet (100 mg total) by mouth every  12 (twelve) hours. 11/12/18   [name], MD  furosemide (LASIX) 40 MG tablet Take 40 mg by mouth See admin instructions. Take 1 tablet (40mg ) by mouth twice daily and 1 additional tablet (40mg ) by mouth if needed for excessive swelling or edema    [provider]  guaiFENesin (ROBITUSSIN) 100 MG/5ML SOLN Take 5 mLs (100 mg total) by mouth every 4 (four) hours as needed for cough or to loosen phlegm. 10/26/16   [name], MD  latanoprost (XALATAN) 0.005 % ophthalmic solution Place 1 drop into both eyes at bedtime. 12/20/17   [provider]  magnesium oxide (MAG-OX) 400 MG tablet Take 400 mg by mouth 2 (two) times daily.    [provider]  metoprolol (TOPROL-XL) 200 MG 24 hr tablet Take 200 mg by mouth daily.    [provider]  nitroGLYCERIN (NITROSTAT) 0.4 MG SL tablet Place 0.4 mg under the tongue every 5 (five) minutes as needed for chest pain.    [provider]  polyethylene glycol (MIRALAX / GLYCOLAX) packet Take 17 g by mouth daily. 11/12/18   [name], MD  predniSONE (DELTASONE) 10 MG tablet 3 tabs po day1; 2 tabs po day2,3; 1 tab po day4,5; 1/2 tab po day6,7 then stop 11/12/18   [name], MD  spironolactone (ALDACTONE) 25 MG tablet Take 12.5 mg by mouth daily.    [provider]  tiotropium (SPIRIVA HANDIHALER) 18 MCG inhalation capsule Place 1 capsule (18 mcg total) into inhaler and inhale daily for 30 days. 11/12/18 12/12/18  [name], MD    Allergies Patient has no known allergies.  Family History  Problem Relation Age of Onset  . Hypertension Mother   . Stroke Father     Social History Social History   Tobacco Use  . Smoking status: Former Research scientist (life sciences)  . Smokeless tobacco: Never Used  Substance Use Topics  . Alcohol use: No  . Drug use: No    Review of Systems  Constitutional: No fever/chills Eyes: No visual changes. ENT: No sore throat. Cardiovascular: Denies chest pain. Respiratory:  Denies shortness of breath. Gastrointestinal: No abdominal pain.  No nausea, no vomiting.  No diarrhea.  No constipation. Genitourinary: Negative for dysuria. Musculoskeletal: Negative for back pain. Skin: Negative for rash. Neurological: Negative for headaches, focal weakness   ____________________________________________   PHYSICAL EXAM:  VITAL SIGNS: ED Triage Vitals  Enc Vitals Group     BP 12/29/18 2050 (!) 116/57     Pulse Rate 12/29/18 2050 62     Resp 12/29/18 2050 16     Temp --      Temp src --      SpO2 12/29/18 2050 94 %     Weight 12/29/18 2042 220 lb (99.8 kg)     Height 12/29/18 2042 5\' 9"  (1.753  m)     Head Circumference --      Peak Flow --      Pain Score 12/29/18 2042 0     Pain Loc --      Pain Edu? --      Excl. in [place]? --     Constitutional: Alert and oriented. Well appearing and in no acute distress. Eyes: Conjunctivae are normal. PERRL. EOMI. Head: Atraumatic. Nose: No congestion/rhinnorhea. Mouth/Throat: Mucous membranes are moist.  Oropharynx non-erythematous. Neck: No stridor.  Cardiovascular: Normal rate, regular rhythm. Grossly normal heart sounds.  Good peripheral circulation. Respiratory: Normal respiratory effort.  No retractions. Lungs CTAB. Gastrointestinal: Soft and nontender. No distention. No abdominal bruits. No CVA tenderness. Musculoskeletal: No lower extremity tenderness nor edema.   Neurologic:  Normal speech and language. No gross focal neurologic deficits are appreciate Skin:  Skin is warm, dry and intact. No rash noted.   ____________________________________________   LABS (all labs ordered are listed, but only abnormal results are displayed)  Labs Reviewed  BASIC METABOLIC PANEL - Abnormal; Notable for the following components:      Result Value   CO2 21 (*)    Glucose, Bld 103 (*)    BUN 55 (*)    Creatinine, Ser 2.12 (*)    GFR calc non Af Amer 30 (*)    GFR calc Af Amer 34 (*)    Anion gap 17 (*)    All other  components within normal limits  CBC - Abnormal; Notable for the following components:   RBC 4.05 (*)    Hemoglobin 12.1 (*)    HCT 37.5 (*)    All other components within normal limits  URINALYSIS, COMPLETE (UACMP) WITH MICROSCOPIC - Abnormal; Notable for the following components:   Color, Urine YELLOW (*)    APPearance HAZY (*)    Hgb urine dipstick MODERATE (*)    All other components within normal limits  ETHANOL - Abnormal; Notable for the following components:   Alcohol, Ethyl (B) 122 (*)    All other components within normal limits  TROPONIN I - Abnormal; Notable for the following components:   Troponin I 0.13 (*)    All other components within normal limits  BRAIN NATRIURETIC PEPTIDE - Abnormal; Notable for the following components:   B Natriuretic Peptide 271.0 (*)    All other components within normal limits  HEPATIC FUNCTION PANEL - Abnormal; Notable for the following components:   Total Protein 8.3 (*)    AST 160 (*)    ALT 130 (*)    All other components within normal limits  CBC WITH DIFFERENTIAL/PLATELET - Abnormal; Notable for the following components:   RBC 4.01 (*)    Hemoglobin 12.2 (*)    HCT 37.2 (*)    All other components within normal limits  URINE DRUG SCREEN, QUALITATIVE ([facility] ONLY)  DIFFERENTIAL  BASIC METABOLIC PANEL   ____________________________________________  EKG  KG read interpreted by me shows atrially paced rhythm at a rate of 67.  Patient does have an IVCD he says.  No obvious acute ST-T changes. ____________________________________________  RADIOLOGY  ED MD interpretation:  Official radiology report(s): Dg Chest Portable 1 View  Result Date: 12/29/2018 CLINICAL DATA:  Weakness with apparent hypotension EXAM: PORTABLE CHEST 1 VIEW COMPARISON:  Chest radiograph November 05, 2018 and chest CT November 10, 2018 FINDINGS: There is atelectatic change in the left mid lung. There is no appreciable edema or consolidation. Heart is mildly  enlarged with pulmonary vascularity  normal. Pacemaker leads are attached the right atrium and right ventricle. There is aortic atherosclerosis. No adenopathy. No bone lesions. There is calcification in each carotid artery. IMPRESSION: Left midlung atelectasis. No edema or consolidation. Mild cardiomegaly. Pacemaker leads attached to right atrium and right ventricle. Aortic Atherosclerosis (ICD10-I70.0). Bilateral carotid artery calcification noted. Electronically Signed   By: Lowella Grip III M.D.   On: 12/29/2018 21:14    ____________________________________________   PROCEDURES  Procedure(s) performed (including Critical Care):  Procedures   ____________________________________________   INITIAL IMPRESSION / ASSESSMENT AND PLAN / ED COURSE  Patient's troponin and BNP are chronically elevated.  His GFR is worse we will give him some fluids and monitor him closely.  Hopefully he will increase his GFR again.  Dr. Owens Shark will monitor him             ____________________________________________   FINAL CLINICAL IMPRESSION(S) / ED DIAGNOSES  Final diagnoses:  Weakness     ED Discharge Orders    None       Note:  This document was prepared using Dragon voice recognition software and may include unintentional dictation errors.    Nena Polio, MD 12/29/18 2314    Nena Polio, MD 12/29/18 [phone]

## 2018-12-29 NOTE — ED Triage Notes (Signed)
Pt presents to ED via [facility] from home c/o generalized weakness x2 days. CBG 106. EMS report smell of ETOH. Pt denies ETOH use. Initial BP 60/24, best BP with EMS 108/72. Lives alone.

## 2018-12-30 ENCOUNTER — Observation Stay: Payer: Medicare HMO

## 2018-12-30 DIAGNOSIS — I2510 Atherosclerotic heart disease of native coronary artery without angina pectoris: Secondary | ICD-10-CM | POA: Diagnosis present

## 2018-12-30 DIAGNOSIS — N179 Acute kidney failure, unspecified: Secondary | ICD-10-CM | POA: Diagnosis present

## 2018-12-30 DIAGNOSIS — N189 Chronic kidney disease, unspecified: Secondary | ICD-10-CM

## 2018-12-30 DIAGNOSIS — F10920 Alcohol use, unspecified with intoxication, uncomplicated: Secondary | ICD-10-CM | POA: Diagnosis present

## 2018-12-30 LAB — CBC
HCT: 32.8 % — ABNORMAL LOW (ref 39.0–52.0)
Hemoglobin: 10.6 g/dL — ABNORMAL LOW (ref 13.0–17.0)
MCH: 30.3 pg (ref 26.0–34.0)
MCHC: 32.3 g/dL (ref 30.0–36.0)
MCV: 93.7 fL (ref 80.0–100.0)
Platelets: 210 10*3/uL (ref 150–400)
RBC: 3.5 MIL/uL — ABNORMAL LOW (ref 4.22–5.81)
RDW: 14.8 % (ref 11.5–15.5)
WBC: 9.9 10*3/uL (ref 4.0–10.5)
nRBC: 0 % (ref 0.0–0.2)

## 2018-12-30 LAB — BASIC METABOLIC PANEL
Anion gap: 15 (ref 5–15)
BUN: 54 mg/dL — ABNORMAL HIGH (ref 8–23)
CO2: 22 mmol/L (ref 22–32)
Calcium: 8.7 mg/dL — ABNORMAL LOW (ref 8.9–10.3)
Chloride: 103 mmol/L (ref 98–111)
Creatinine, Ser: 2.12 mg/dL — ABNORMAL HIGH (ref 0.61–1.24)
GFR calc Af Amer: 34 mL/min — ABNORMAL LOW (ref >60)
GFR calc non Af Amer: 30 mL/min — ABNORMAL LOW (ref >60)
Glucose, Bld: 164 mg/dL — ABNORMAL HIGH (ref 70–99)
Potassium: 4 mmol/L (ref 3.5–5.1)
Sodium: 140 mmol/L (ref 135–145)

## 2018-12-30 LAB — MRSA PCR SCREENING: MRSA by PCR: POSITIVE — AB

## 2018-12-30 MED ORDER — ONDANSETRON HCL 4 MG PO TABS
4.0000 mg | ORAL_TABLET | Freq: Four times a day (QID) | ORAL | Status: DC | PRN
  Administered 2018-12-30 – 2019-01-03 (×3): 4 mg via ORAL
  Filled 2018-12-30 (×3): qty 1

## 2018-12-30 MED ORDER — FUROSEMIDE 10 MG/ML IJ SOLN
40.0000 mg | Freq: Once | INTRAMUSCULAR | Status: AC
  Administered 2018-12-30: 20 mg via INTRAVENOUS
  Filled 2018-12-30: qty 4

## 2018-12-30 MED ORDER — ADULT MULTIVITAMIN W/MINERALS CH
1.0000 | ORAL_TABLET | Freq: Every day | ORAL | Status: DC
  Administered 2018-12-30 – 2019-01-21 (×23): 1 via ORAL
  Filled 2018-12-30 (×23): qty 1

## 2018-12-30 MED ORDER — ACETAMINOPHEN 325 MG PO TABS
650.0000 mg | ORAL_TABLET | Freq: Four times a day (QID) | ORAL | Status: DC | PRN
  Administered 2018-12-31 – 2019-01-19 (×10): 650 mg via ORAL
  Filled 2018-12-30 (×10): qty 2

## 2018-12-30 MED ORDER — VITAMIN B-1 100 MG PO TABS
100.0000 mg | ORAL_TABLET | Freq: Every day | ORAL | Status: DC
  Administered 2018-12-30 – 2019-01-21 (×22): 100 mg via ORAL
  Filled 2018-12-30 (×23): qty 1

## 2018-12-30 MED ORDER — LORAZEPAM 2 MG/ML IJ SOLN: 1.0000 mg | Freq: Four times a day (QID) | INTRAMUSCULAR | Status: DC | PRN

## 2018-12-30 MED ORDER — THIAMINE HCL 100 MG/ML IJ SOLN
100.0000 mg | Freq: Every day | INTRAMUSCULAR | Status: DC
  Administered 2019-01-04: 100 mg via INTRAVENOUS
  Filled 2018-12-30: qty 2

## 2018-12-30 MED ORDER — ONDANSETRON HCL 4 MG/2ML IJ SOLN
4.0000 mg | Freq: Four times a day (QID) | INTRAMUSCULAR | Status: DC | PRN
  Administered 2019-01-04 – 2019-01-12 (×4): 4 mg via INTRAVENOUS
  Filled 2018-12-30 (×4): qty 2

## 2018-12-30 MED ORDER — MAGNESIUM OXIDE 400 (241.3 MG) MG PO TABS
400.0000 mg | ORAL_TABLET | Freq: Two times a day (BID) | ORAL | Status: DC
  Administered 2018-12-30 – 2019-01-21 (×44): 400 mg via ORAL
  Filled 2018-12-30 (×44): qty 1

## 2018-12-30 MED ORDER — SODIUM CHLORIDE 0.9 % IV SOLN
INTRAVENOUS | Status: AC
  Administered 2018-12-30: 04:00:00 via INTRAVENOUS

## 2018-12-30 MED ORDER — SODIUM CHLORIDE 0.9 % IV SOLN: INTRAVENOUS | Status: DC

## 2018-12-30 MED ORDER — LORAZEPAM 1 MG PO TABS
1.0000 mg | ORAL_TABLET | Freq: Four times a day (QID) | ORAL | Status: DC | PRN
  Administered 2018-12-30: 03:00:00 1 mg via ORAL
  Filled 2018-12-30: qty 1

## 2018-12-30 MED ORDER — IPRATROPIUM-ALBUTEROL 0.5-2.5 (3) MG/3ML IN SOLN
3.0000 mL | Freq: Once | RESPIRATORY_TRACT | Status: AC
  Administered 2018-12-30: 03:00:00 3 mL via RESPIRATORY_TRACT
  Filled 2018-12-30: qty 3

## 2018-12-30 MED ORDER — ACETAMINOPHEN 650 MG RE SUPP: 650.0000 mg | Freq: Four times a day (QID) | RECTAL | Status: DC | PRN

## 2018-12-30 MED ORDER — TIOTROPIUM BROMIDE MONOHYDRATE 18 MCG IN CAPS
18.0000 ug | ORAL_CAPSULE | Freq: Every day | RESPIRATORY_TRACT | Status: DC
  Administered 2018-12-30 – 2019-01-03 (×5): 18 ug via RESPIRATORY_TRACT
  Filled 2018-12-30 (×2): qty 5

## 2018-12-30 MED ORDER — FOLIC ACID 1 MG PO TABS
1.0000 mg | ORAL_TABLET | Freq: Every day | ORAL | Status: DC
  Administered 2018-12-30 – 2019-01-21 (×23): 1 mg via ORAL
  Filled 2018-12-30 (×23): qty 1

## 2018-12-30 MED ORDER — CHLORHEXIDINE GLUCONATE CLOTH 2 % EX PADS
6.0000 | MEDICATED_PAD | Freq: Every day | CUTANEOUS | Status: DC
  Administered 2018-12-30 – 2019-01-04 (×6): 6 via TOPICAL

## 2018-12-30 MED ORDER — APIXABAN 5 MG PO TABS
5.0000 mg | ORAL_TABLET | Freq: Two times a day (BID) | ORAL | Status: DC
  Administered 2018-12-30 – 2019-01-17 (×37): 5 mg via ORAL
  Filled 2018-12-30 (×37): qty 1

## 2018-12-30 MED ORDER — MUPIROCIN 2 % EX OINT
TOPICAL_OINTMENT | Freq: Two times a day (BID) | CUTANEOUS | Status: DC
  Administered 2018-12-30 – 2019-01-04 (×10): via NASAL
  Filled 2018-12-30 (×2): qty 22

## 2018-12-30 MED ORDER — ASPIRIN 81 MG PO CHEW
81.0000 mg | CHEWABLE_TABLET | Freq: Every day | ORAL | Status: DC
  Administered 2018-12-30 – 2019-01-21 (×23): 81 mg via ORAL
  Filled 2018-12-30 (×23): qty 1

## 2018-12-30 MED ORDER — MOMETASONE FURO-FORMOTEROL FUM 100-5 MCG/ACT IN AERO
2.0000 | INHALATION_SPRAY | Freq: Two times a day (BID) | RESPIRATORY_TRACT | Status: DC
  Administered 2018-12-30 – 2019-01-03 (×9): 2 via RESPIRATORY_TRACT
  Filled 2018-12-30: qty 8.8

## 2018-12-30 MED ORDER — AMLODIPINE BESYLATE 10 MG PO TABS
10.0000 mg | ORAL_TABLET | Freq: Every day | ORAL | Status: DC
  Administered 2018-12-30 – 2019-01-21 (×23): 10 mg via ORAL
  Filled 2018-12-30 (×24): qty 1

## 2018-12-30 NOTE — Care Management (Signed)
Patient admitted with acute on chronic renal failure, patient also has a history of COPD.  Helene Kelp with [facility] notified this RNCM that patient is open with [facility] for home health services: nursing, PT, and home health aide.

## 2018-12-30 NOTE — ED Notes (Signed)
Patient has received 1L bolus in 250cc increments. No complications observed

## 2018-12-30 NOTE — Progress Notes (Addendum)
Darius Taylor at [place] NAME: Darius Taylor    MR#:  [id number]  DATE OF BIRTH:  1946/07/29  SUBJECTIVE:  CHIEF COMPLAINT:   Chief Complaint  Patient presents with  . Weakness    REVIEW OF SYSTEMS:  ROS Review of Systems  Constitutional: Negative for chills and fever.  HENT: Negative for hearing loss and tinnitus.   Eyes: Negative for blurred vision and double vision.  Respiratory: Negative for cough and shortness of breath.   Cardiovascular: Negative for chest pain and palpitations.  Gastrointestinal: Negative for heartburn and nausea.  Genitourinary: Negative for dysuria and urgency.  Musculoskeletal: Negative for neck pain.  Skin: Negative for itching and rash.  Neurological: Negative for dizziness.  Psychiatric/Behavioral: Negative for depression and hallucinations.  DRUG ALLERGIES:  No Known Allergies VITALS:  Blood pressure 117/65, pulse 65, temperature 99.5 F (37.5 C), temperature source Oral, resp. rate 20, height 5\' 11"  (1.803 m), weight 98.1 kg, SpO2 97 %. PHYSICAL EXAMINATION:   Physical Exam  Constitutional: He is oriented to person, place, and time. He appears well-developed.  HENT:  Head: Normocephalic and atraumatic.  Right Ear: External ear normal.  Eyes: Pupils are equal, round, and reactive to light. Conjunctivae are normal. Right eye exhibits no discharge.  Neck: Normal range of motion. Neck supple. No tracheal deviation present.  Cardiovascular:  Irregularly irregular  Respiratory: Effort normal and breath sounds normal. He has no rales.  GI: Soft. Bowel sounds are normal.  Musculoskeletal: Normal range of motion.        General: No edema.  Neurological: He is alert and oriented to person, place, and time.  Skin: Skin is warm. He is not diaphoretic. No erythema.  Psychiatric: He has a normal mood and affect. His behavior is normal.   LABORATORY PANEL:  Male CBC Recent Labs  Lab 12/30/18 0513  WBC  9.9  HGB 10.6*  HCT 32.8*  PLT 210   ------------------------------------------------------------------------------------------------------------------ Chemistries  Recent Labs  Lab 12/29/18 2119  12/30/18 0513  NA  --    < > 140  K  --    < > 4.0  CL  --    < > 103  CO2  --    < > 22  GLUCOSE  --    < > 164*  BUN  --    < > 54*  CREATININE  --    < > 2.12*  CALCIUM  --    < > 8.7*  AST 160*  --   --   ALT 130*  --   --   ALKPHOS 97  --   --   BILITOT 1.1  --   --    < > = values in this interval not displayed.   RADIOLOGY:  Dg Chest Port 1 View  Result Date: 12/30/2018 CLINICAL DATA:  Progressive shortness of breath. EXAM: PORTABLE CHEST 1 VIEW COMPARISON:  Radiograph yesterday. CT 11/10/2018 FINDINGS: Similar cardiomegaly. Unchanged mediastinal contours. Aortic atherosclerosis with aortic root replacement. Left-sided pacemaker remains in place. Vascular congestion without overt pulmonary edema. Mild elevation of left hemidiaphragm. Improved atelectasis in the left mid lung. No large pleural effusion. No pneumothorax. Unchanged osseous structures. IMPRESSION: Cardiomegaly with vascular congestion. No overt pulmonary edema. Improved left midlung atelectasis. Aortic Atherosclerosis (ICD10-I70.0). Electronically Signed   By: Keith Rake M.D.   On: 12/30/2018 02:01   Dg Chest Portable 1 View  Result Date: 12/29/2018 CLINICAL DATA:  Weakness with apparent hypotension  EXAM: PORTABLE CHEST 1 VIEW COMPARISON:  Chest radiograph November 05, 2018 and chest CT November 10, 2018 FINDINGS: There is atelectatic change in the left mid lung. There is no appreciable edema or consolidation. Heart is mildly enlarged with pulmonary vascularity normal. Pacemaker leads are attached the right atrium and right ventricle. There is aortic atherosclerosis. No adenopathy. No bone lesions. There is calcification in each carotid artery. IMPRESSION: Left midlung atelectasis. No edema or consolidation. Mild  cardiomegaly. Pacemaker leads attached to right atrium and right ventricle. Aortic Atherosclerosis (ICD10-I70.0). Bilateral carotid artery calcification noted. Electronically Signed   By: Lowella Grip III M.D.   On: 12/29/2018 21:14   ASSESSMENT AND PLAN:   1.  Acute kidney injury superimposed on chronic kidney disease stage III Likely due to recent alcohol use leading to dehydration.  Renal function improved slightly with initial IV fluid hydration.  IV fluid had to be discontinued in the emergency room due to patient being short of breath.  Chest x-ray done showed mild pulmonary vascular congestion.  Was given a dose of IV Lasix in the emergency room.   Follow-up on renal function in a.m.  Avoid nephrotoxic agents   2.  Alcohol intoxication Patient clinically stable this morning.  Alcohol level was 122.  Patient denies drinking alcohol on a daily basis. Monitor for any signs of alcohol withdrawal.  Continue thiamine, multivitamin and folic acid  3.  Chronic diastolic CHF Stable.  Will resume Lasix in a.m. if renal function stable  4.  CAD Stable.  5.  COPD Stable No continue nebs  6.  Paroxysmal atrial fibrillation Rate controlled Patient on anticoagulation with Eliquis  7.  Generalized weakness Physical therapist to evaluate and treat  DVT prophylaxis; patient on Eliquis  All the records are reviewed and case discussed with Care Management/Social Worker. Management plans discussed with the patient, family and they are in agreement.  CODE STATUS: Full Code  TOTAL TIME TAKING CARE OF THIS PATIENT: 35 minutes.   More than 50% of the time was spent in counseling/coordination of care: YES  POSSIBLE D/C IN 1-2 DAYS, DEPENDING ON CLINICAL CONDITION.   Darius Taylor M.D on 12/30/2018 at 11:31 AM  Between 7am to 6pm - Pager - [phone]  After 6pm go to [email or web address] - Technical brewer [place] [facility]  Office  [phone]  CC: Primary care  physician; [employer]  Note: This dictation was prepared with Diplomatic Services operational officer dictation along with smaller Company secretary. Any transcriptional errors that result from this process are unintentional.

## 2018-12-30 NOTE — Evaluation (Signed)
Physical Therapy Evaluation Patient Details Name: Darius Taylor MRN: [id number] DOB: 1946/02/18 Today's Date: 12/30/2018   History of Present Illness  73 y.o. male with a known history of atrial fibrillation on eliquis, CAD, nonischemic cardiomyopathy with last known EF of 25% status post AICD and pacemaker, CKD, history of colonic adenocarcinoma status post partial colectomy, history of renal cell carcinoma status post left nephrectomy, COPD on as needed home oxygen presented to the hospital secondary to worsening ED with a complaint of weakness and fatigue.  He was found here to have an elevated creatinine he has elevated troponin and BNP.  Clinical Impression  Pt did relatively well with PT exam and was eager to get up and do some walking in the room.  He does not use AD at baseline (though he does have ADs) and reports that he only rarely uses his O2.  Pt did relatively well with ~60 ft of ambulation in room, though on room air O2 dropped from low 90s to low 80s t/o excessive shortness of breath or c/o fatigue.  Pt is not at his baseline and will need HHPT to get back to PLOF, but he is safe to go home with some assist from daughter, etc.    Follow Up Recommendations Home health PT    Equipment Recommendations  None recommended by PT(has home O2, may need to increase usage)    Recommendations for Other Services       Precautions / Restrictions Precautions Precautions: Fall Restrictions Weight Bearing Restrictions: No      Mobility  Bed Mobility Overal bed mobility: Independent             General bed mobility comments: Pt able to get to sitting EOB w/o assist  Transfers Overall transfer level: Independent Equipment used: Rolling walker (2 wheeled)             General transfer comment: Pt did well getting to standing w/o assist, showed good cofidence with walker and had no overt safety issues  Ambulation/Gait Ambulation/Gait assistance: Modified independent  (Device/Increase time) Gait Distance (Feet): 65 Feet Assistive device: Rolling walker (2 wheeled)       General Gait Details: Pt was able to ambulate >60 ft in room with good confidence and relatively consistent cadence, pt does not use RW at baseline.  Pt also reports that he does not need O2 during ambulation at baseline, however on room air his O2 dropped from low 90s to low 80s with modest effort - O2 reapplied post ambulation  Stairs            Wheelchair Mobility    Modified Rankin (Stroke Patients Only)       Balance Overall balance assessment: Modified Independent                                           Pertinent Vitals/Pain Pain Assessment: No/denies pain    Home Living Family/patient expects to be discharged to:: Private residence Living Arrangements: Alone Available Help at Discharge: Family;Available PRN/intermittently(daughter lives next door) Type of Home: House Home Access: Stairs to enter Entrance Stairs-Rails: Can reach both Entrance Stairs-Number of Steps: 3 Home Layout: One level Home Equipment: Shower seat;Cane - quad;Walker - 4 wheels;Walker - 2 wheels;Grab bars - toilet;Grab bars - tub/shower      Prior Function Level of Independence: Independent  Comments: Ind amb community distances without an AD, no fall history, PRN O2 at home but typically does not use O2 with community ambulation, Ind with ADLs     Hand Dominance        Extremity/Trunk Assessment   Upper Extremity Assessment Upper Extremity Assessment: Overall WFL for tasks assessed    Lower Extremity Assessment Lower Extremity Assessment: Overall WFL for tasks assessed       Communication   Communication: No difficulties  Cognition Arousal/Alertness: Awake/alert Behavior During Therapy: WFL for tasks assessed/performed Overall Cognitive Status: Within Functional Limits for tasks assessed                                         General Comments      Exercises     Assessment/Plan    PT Assessment Patient needs continued PT services  PT Problem List Decreased strength;Decreased range of motion;Decreased activity tolerance;Decreased mobility;Decreased balance;Decreased coordination;Decreased knowledge of use of DME;Decreased safety awareness;Decreased knowledge of precautions       PT Treatment Interventions DME instruction;Gait training;Stair training;Functional mobility training;Balance training;Therapeutic exercise;Therapeutic activities;Neuromuscular re-education;Patient/family education    PT Goals (Current goals can be found in the Care Plan section)  Acute Rehab PT Goals Patient Stated Goal: go home PT Goal Formulation: With patient/family Time For Goal Achievement: 01/13/19 Potential to Achieve Goals: Good    Frequency Min 2X/week   Barriers to discharge        Co-evaluation               AM-PAC PT "6 Clicks" Mobility  Outcome Measure Help needed turning from your back to your side while in a flat bed without using bedrails?: None Help needed moving from lying on your back to sitting on the side of a flat bed without using bedrails?: None Help needed moving to and from a bed to a chair (including a wheelchair)?: None Help needed standing up from a chair using your arms (e.g., wheelchair or bedside chair)?: None Help needed to walk in hospital room?: None Help needed climbing 3-5 steps with a railing? : A Little 6 Click Score: 23    End of Session Equipment Utilized During Treatment: Gait belt;Oxygen(2L) Activity Tolerance: Patient limited by fatigue Patient left: in chair;with call bell/phone within reach;with nursing/sitter in room Nurse Communication: (to get O2 extention cord/connector) PT Visit Diagnosis: Muscle weakness (generalized) (M62.81);Difficulty in walking, not elsewhere classified (R26.2)    Time: 4801-6553 PT Time Calculation (min) (ACUTE ONLY): 17  min   Charges:   PT Evaluation $PT Eval Low Complexity: 1 Low          Kreg Shropshire, DPT 12/30/2018, 3:09 PM

## 2018-12-30 NOTE — ED Notes (Signed)
ED TO INPATIENT HANDOFF REPORT  ED Nurse Name and Phone #: Anda Kraft [phone]  S Name/Age/Gender Darius Taylor 73 y.o. male Room/Bed: ED12A/ED12A  Code Status   Code Status: Prior  Home/SNF/Other Home Patient oriented to: self, place, time and situation Is this baseline? Yes   Triage Complete: Triage complete  Chief Complaint Weakness  Triage Note Pt presents to ED via [facility] from home c/o generalized weakness x2 days. CBG 106. EMS report smell of ETOH. Pt denies ETOH use. Initial BP 60/24, best BP with EMS 108/72. Lives alone.    Allergies No Known Allergies  Level of Care/Admitting Diagnosis ED Disposition    ED Disposition Condition [facility] Area: [place] [100120]  Level of Care: Med-Surg [16]  Diagnosis: Acute on chronic renal failure [facility]) [[id number]]  Admitting Physician: Lance Coon [[id number]]  Attending Physician: Lance Coon [[id number]]  PT Class (Do Not Modify): Observation [104]  PT Acc Code (Do Not Modify): Observation [10022]       B Medical/Surgery History Past Medical History:  Diagnosis Date  . Aortic stenosis   . Arrhythmia    atrial fibrillation  . Asthma   . Atrial fibrillation ([place])   . CAD (coronary artery disease)   . Cancer [facility])    colon, bladder and kidney  . CHF (congestive heart failure) ([place])   . 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 COPD (chronic obstructive pulmonary disease) ([place])   . H/O ventricular tachycardia   . Hypertension   . Nonischemic cardiomyopathy ([place])    Status post AICD and pacemaker  . Renal cell carcinoma [facility])    Past Surgical History:  Procedure Laterality Date  . CARDIAC DEFIBRILLATOR PLACEMENT    . CARDIAC DEFIBRILLATOR PLACEMENT    . COLONOSCOPY WITH PROPOFOL N/A 06/10/2018   Procedure: COLONOSCOPY WITH PROPOFOL;  Surgeon: Lin Landsman, MD;  Location: [facility] ENDOSCOPY;  Service: Gastroenterology;  Laterality: N/A;  . CORONARY ANGIOPLASTY  09/25/2016  . IRRIGATION  AND DEBRIDEMENT HEMATOMA Left 10/24/2016   Procedure: IRRIGATION AND DEBRIDEMENT HEMATOMA;  Surgeon: Florene Glen, MD;  Location: [facility] ORS;  Service: General;  Laterality: Left;  . KIDNEY SURGERY     left ne[hrectomy for RCC  . PACEMAKER IMPLANT    . PARTIAL COLECTOMY       A IV Location/Drains/Wounds Patient Lines/Drains/Airways Status   Active Line/Drains/Airways    Name:   Placement date:   Placement time:   Site:   Days:   Peripheral IV 12/29/18 Left Forearm   12/29/18    2051    Forearm   1          Intake/Output Last 24 hours No intake or output data in the 24 hours ending 12/30/18 0135  Labs/Imaging Results for orders placed or performed during the hospital encounter of 12/29/18 (from the past 48 hour(s))  Basic metabolic panel     Status: Abnormal   Collection Time: 12/29/18  8:44 PM  Result Value Ref Range   Sodium 139 135 - 145 mmol/L   Potassium 3.8 3.5 - 5.1 mmol/L   Chloride 101 98 - 111 mmol/L   CO2 21 (L) 22 - 32 mmol/L   Glucose, Bld 103 (H) 70 - 99 mg/dL   BUN 55 (H) 8 - 23 mg/dL   Creatinine, Ser 2.12 (H) 0.61 - 1.24 mg/dL   Calcium 8.9 8.9 - 10.3 mg/dL   GFR calc non Af Amer 30 (L) >60 mL/min   GFR calc Af  Amer 34 (L) >60 mL/min   Anion gap 17 (H) 5 - 15    Comment: Performed at [facility], [address] [place], [place] [address]  CBC     Status: Abnormal   Collection Time: 12/29/18  8:44 PM  Result Value Ref Range   WBC 8.5 4.0 - 10.5 K/uL   RBC 4.05 (L) 4.22 - 5.81 MIL/uL   Hemoglobin 12.1 (L) 13.0 - 17.0 g/dL   HCT 37.5 (L) 39.0 - 52.0 %   MCV 92.6 80.0 - 100.0 fL   MCH 29.9 26.0 - 34.0 pg   MCHC 32.3 30.0 - 36.0 g/dL   RDW 14.7 11.5 - 15.5 %   Platelets 247 150 - 400 K/uL   nRBC 0.0 0.0 - 0.2 %    Comment: Performed at [facility], [address] [place], [place] [address]  Urinalysis, Complete w Microscopic     Status: Abnormal   Collection Time: 12/29/18  8:44 PM  Result Value Ref Range   Color, Urine  YELLOW (A) YELLOW   APPearance HAZY (A) CLEAR   Specific Gravity, Urine 1.013 1.005 - 1.030   pH 5.0 5.0 - 8.0   Glucose, UA NEGATIVE NEGATIVE mg/dL   Hgb urine dipstick MODERATE (A) NEGATIVE   Bilirubin Urine NEGATIVE NEGATIVE   Ketones, ur NEGATIVE NEGATIVE mg/dL   Protein, ur NEGATIVE NEGATIVE mg/dL   Nitrite NEGATIVE NEGATIVE   Leukocytes,Ua NEGATIVE NEGATIVE   RBC / HPF 6-10 0 - 5 RBC/hpf   WBC, UA 0-5 0 - 5 WBC/hpf   Bacteria, UA NONE SEEN NONE SEEN   Squamous Epithelial / LPF 0-5 0 - 5   Mucus PRESENT    Hyaline Casts, UA PRESENT     Comment: Performed at [facility], [address] [place], [place] [address]  CBC with Differential/Platelet     Status: Abnormal   Collection Time: 12/29/18  8:44 PM  Result Value Ref Range   WBC 8.1 4.0 - 10.5 K/uL   RBC 4.01 (L) 4.22 - 5.81 MIL/uL   Hemoglobin 12.2 (L) 13.0 - 17.0 g/dL   HCT 37.2 (L) 39.0 - 52.0 %   MCV 92.8 80.0 - 100.0 fL   MCH 30.4 26.0 - 34.0 pg   MCHC 32.8 30.0 - 36.0 g/dL   RDW 14.6 11.5 - 15.5 %   Platelets 239 150 - 400 K/uL   nRBC 0.0 0.0 - 0.2 %   Neutrophils Relative % 72 %   Neutro Abs 6.0 1.7 - 7.7 K/uL   Lymphocytes Relative 17 %   Lymphs Abs 1.3 0.7 - 4.0 K/uL   Monocytes Relative 8 %   Monocytes Absolute 0.6 0.1 - 1.0 K/uL   Eosinophils Relative 1 %   Eosinophils Absolute 0.0 0.0 - 0.5 K/uL   Basophils Relative 1 %   Basophils Absolute 0.1 0.0 - 0.1 K/uL   Immature Granulocytes 1 %   Abs Immature Granulocytes 0.05 0.00 - 0.07 K/uL    Comment: Performed at [facility], [address] [place], [place] [address]  Urine Drug Screen, Qualitative     Status: None   Collection Time: 12/29/18  8:57 PM  Result Value Ref Range   Tricyclic, Ur Screen NONE DETECTED NONE DETECTED   Amphetamines, Ur Screen NONE DETECTED NONE DETECTED   MDMA (Ecstasy)Ur Screen NONE DETECTED NONE DETECTED   Cocaine Metabolite,Ur [place] NONE DETECTED NONE DETECTED   Opiate, Ur Screen NONE DETECTED NONE  DETECTED  Phencyclidine (PCP) Ur S NONE DETECTED NONE DETECTED   Cannabinoid 50 Ng, Ur [place] NONE DETECTED NONE DETECTED   Barbiturates, Ur Screen NONE DETECTED NONE DETECTED   Benzodiazepine, Ur Scrn NONE DETECTED NONE DETECTED   Methadone Scn, Ur NONE DETECTED NONE DETECTED    Comment: (NOTE) Tricyclics + metabolites, urine    Cutoff 1000 ng/mL Amphetamines + metabolites, urine  Cutoff 1000 ng/mL MDMA (Ecstasy), urine              Cutoff 500 ng/mL Cocaine Metabolite, urine          Cutoff 300 ng/mL Opiate + metabolites, urine        Cutoff 300 ng/mL Phencyclidine (PCP), urine         Cutoff 25 ng/mL Cannabinoid, urine                 Cutoff 50 ng/mL Barbiturates + metabolites, urine  Cutoff 200 ng/mL Benzodiazepine, urine              Cutoff 200 ng/mL Methadone, urine                   Cutoff 300 ng/mL The urine drug screen provides only a preliminary, unconfirmed analytical test result and should not be used for non-medical purposes. Clinical consideration and professional judgment should be applied to any positive drug screen result due to possible interfering substances. A more specific alternate chemical method must be used in order to obtain a confirmed analytical result. Gas chromatography / mass spectrometry (GC/MS) is the preferred confirmat ory method. Performed at [facility], [address] [place], [place] [address]   Ethanol     Status: Abnormal   Collection Time: 12/29/18  9:19 PM  Result Value Ref Range   Alcohol, Ethyl (B) 122 (H) <10 mg/dL    Comment: (NOTE) Lowest detectable limit for serum alcohol is 10 mg/dL. For medical purposes only. Performed at [facility], [address] [place], [place] [address]   Troponin I - Once     Status: Abnormal   Collection Time: 12/29/18  9:19 PM  Result Value Ref Range   Troponin I 0.13 (HH) <0.03 ng/mL    Comment: CRITICAL RESULT CALLED TO, READ BACK BY AND VERIFIED WITH KATE BUCKNAM AT 2216  12/29/2018 SMA Performed at [facility], [address] [place], [place] [address]   Brain natriuretic peptide     Status: Abnormal   Collection Time: 12/29/18  9:19 PM  Result Value Ref Range   B Natriuretic Peptide 271.0 (H) 0.0 - 100.0 pg/mL    Comment: Performed at [facility], [address] [place], [place] [address]  Hepatic function panel     Status: Abnormal   Collection Time: 12/29/18  9:19 PM  Result Value Ref Range   Total Protein 8.3 (H) 6.5 - 8.1 g/dL   Albumin 4.2 3.5 - 5.0 g/dL   AST 160 (H) 15 - 41 U/L   ALT 130 (H) 0 - 44 U/L   Alkaline Phosphatase 97 38 - 126 U/L   Total Bilirubin 1.1 0.3 - 1.2 mg/dL   Bilirubin, Direct 0.2 0.0 - 0.2 mg/dL   Indirect Bilirubin 0.9 0.3 - 0.9 mg/dL    Comment: Performed at [facility], [address] [place],  [address]  Basic metabolic panel     Status: Abnormal   Collection Time: 12/29/18 11:02 PM  Result Value Ref Range   Sodium 141 135 - 145 mmol/L  Potassium 3.8 3.5 - 5.1 mmol/L   Chloride 103 98 - 111 mmol/L   CO2 22 22 - 32 mmol/L   Glucose, Bld 98 70 - 99 mg/dL   BUN 54 (H) 8 - 23 mg/dL   Creatinine, Ser 2.15 (H) 0.61 - 1.24 mg/dL   Calcium 8.5 (L) 8.9 - 10.3 mg/dL   GFR calc non Af Amer 29 (L) >60 mL/min   GFR calc Af Amer 34 (L) >60 mL/min   Anion gap 16 (H) 5 - 15    Comment: Performed at [facility], [address] [place],  [address]   Dg Chest Portable 1 View  Result Date: 12/29/2018 CLINICAL DATA:  Weakness with apparent hypotension EXAM: PORTABLE CHEST 1 VIEW COMPARISON:  Chest radiograph November 05, 2018 and chest CT November 10, 2018 FINDINGS: There is atelectatic change in the left mid lung. There is no appreciable edema or consolidation. Heart is mildly enlarged with pulmonary vascularity normal. Pacemaker leads are attached the right atrium and right ventricle. There is aortic atherosclerosis. No adenopathy. No bone lesions. There is  calcification in each carotid artery. IMPRESSION: Left midlung atelectasis. No edema or consolidation. Mild cardiomegaly. Pacemaker leads attached to right atrium and right ventricle. Aortic Atherosclerosis (ICD10-I70.0). Bilateral carotid artery calcification noted. Electronically Signed   By: Lowella Grip III M.D.   On: 12/29/2018 21:14    Pending Labs Unresulted Labs (From admission, onward)    Start     Ordered   Signed and Occupational hygienist morning,   R     Signed and Held   Signed and Held  CBC  Tomorrow morning,   R     Signed and Held          Vitals/Pain Today's Vitals   12/30/18 0000 12/30/18 0030 12/30/18 0100 12/30/18 0130  BP: (!) 117/54 118/67 (!) 85/67 118/65  Pulse: (!) 59 (!) 59 (!) 53 62  Resp: (!) 22 17 (!) 21 18  Temp:      SpO2: 92% 95% 98% 96%  Weight:      Height:      PainSc:        Isolation Precautions No active isolations  Medications Medications  furosemide (LASIX) injection 40 mg (has no administration in time range)  sodium chloride 0.9 % bolus 1,000 mL (0 mLs Intravenous Stopped 12/30/18 0017)    Mobility walks with person assist Low fall risk   Focused Assessments Cardiac Assessment Handoff:    Lab Results  Component Value Date   CKTOTAL 63 11/18/2017   CKMB 1.5 10/19/2014   TROPONINI 0.13 ([place]) 12/29/2018   No results found for: DDIMER Does the Patient currently have chest pain? No     R Recommendations: See Admitting Provider Note  Report given to:   Additional Notes:

## 2018-12-30 NOTE — Plan of Care (Signed)
  Problem: Education: Goal: Knowledge of General Education information will improve Description Including pain rating scale, medication(s)/side effects and non-pharmacologic comfort measures Outcome: Progressing   Problem: Health Behavior/Discharge Planning: Goal: Ability to manage health-related needs will improve Outcome: Progressing   Problem: Clinical Measurements: Goal: Ability to maintain clinical measurements within normal limits will improve Outcome: Progressing Goal: Will remain free from infection Outcome: Progressing Goal: Diagnostic test results will improve Outcome: Progressing Goal: Respiratory complications will improve Outcome: Progressing Goal: Cardiovascular complication will be avoided Outcome: Progressing   Problem: Activity: Goal: Risk for activity intolerance will decrease Outcome: Progressing   Problem: Nutrition: Goal: Adequate nutrition will be maintained Outcome: Progressing   Problem: Coping: Goal: Level of anxiety will decrease Outcome: Progressing   Problem: Elimination: Goal: Will not experience complications related to bowel motility Outcome: Progressing Goal: Will not experience complications related to urinary retention Outcome: Progressing   Problem: Pain Managment: Goal: General experience of comfort will improve Outcome: Progressing   Problem: Safety: Goal: Ability to remain free from injury will improve Outcome: Progressing   Problem: Skin Integrity: Goal: Risk for impaired skin integrity will decrease Outcome: Progressing  Chair this pm  p.t. ambulated pt. today

## 2018-12-30 NOTE — H&P (Signed)
[place] [facility] [place] NAME: Darius Taylor    MR#:  [id number]  DATE OF BIRTH:  1946/05/30  DATE OF ADMISSION:  12/29/2018  PRIMARY CARE PHYSICIAN: [facility] [place]   REQUESTING/REFERRING PHYSICIAN: Owens Shark, MD  CHIEF COMPLAINT:   Chief Complaint  Patient presents with  . Weakness    HISTORY OF PRESENT ILLNESS:  Darius Taylor  is a 73 y.o. male who presents with chief complaint as above.  Patient presents the ED with a complaint of weakness and fatigue.  He was found here to have an elevated creatinine.  He has elevated troponin and BNP as well, but these are at baseline chronic elevation.  Patient's alcohol level is also elevated, though he cannot give a clear indication of how much she has had to drink recently.  He was treated with IV fluids in the ED in the hopes of improving his creatinine, though it did not improve.  Hospitalist were called for admission  PAST MEDICAL HISTORY:   Past Medical History:  Diagnosis Date  . Aortic stenosis   . Arrhythmia    atrial fibrillation  . Asthma   . Atrial fibrillation ([place])   . CAD (coronary artery disease)   . Cancer [facility])    colon, bladder and kidney  . CHF (congestive heart failure) ([place])   . 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 COPD (chronic obstructive pulmonary disease) ([place])   . H/O ventricular tachycardia   . Hypertension   . Nonischemic cardiomyopathy ([place])    Status post AICD and pacemaker  . Renal cell carcinoma ([place])      PAST SURGICAL HISTORY:   Past Surgical History:  Procedure Laterality Date  . CARDIAC DEFIBRILLATOR PLACEMENT    . CARDIAC DEFIBRILLATOR PLACEMENT    . COLONOSCOPY WITH PROPOFOL N/A 06/10/2018   Procedure: COLONOSCOPY WITH PROPOFOL;  Surgeon: Lin Landsman, MD;  Location: [facility] ENDOSCOPY;  Service: Gastroenterology;  Laterality: N/A;  . CORONARY ANGIOPLASTY  09/25/2016  . IRRIGATION AND DEBRIDEMENT HEMATOMA Left 10/24/2016   Procedure:  IRRIGATION AND DEBRIDEMENT HEMATOMA;  Surgeon: Florene Glen, MD;  Location: [facility] ORS;  Service: General;  Laterality: Left;  . KIDNEY SURGERY     left ne[hrectomy for RCC  . PACEMAKER IMPLANT    . PARTIAL COLECTOMY       SOCIAL HISTORY:   Social History   Tobacco Use  . Smoking status: Former Research scientist (life sciences)  . Smokeless tobacco: Never Used  Substance Use Topics  . Alcohol use: No     FAMILY HISTORY:   Family History  Problem Relation Age of Onset  . Hypertension Mother   . Stroke Father      DRUG ALLERGIES:  No Known Allergies  MEDICATIONS AT HOME:   Prior to Admission medications   Medication Sig Start Date End Date Taking? Authorizing Provider  acetaminophen (TYLENOL) 325 MG tablet Take 650 mg by mouth every 6 (six) hours as needed for mild pain or fever.    Yes [provider]  acetylcysteine (MUCOMYST) 20 % nebulizer solution 22ml nebulizer twice a day  Dx:j44.1 11/12/18  Yes Wieting, Richard, MD  allopurinol (ZYLOPRIM) 100 MG tablet Take 100 mg by mouth every morning. 12/22/18  Yes [provider]  amiodarone (PACERONE) 200 MG tablet Take 200 mg by mouth daily.   Yes [provider]  amLODipine (NORVASC) 10 MG tablet Take 10 mg by mouth daily.   Yes [provider]  apixaban Arne [place]) 5  MG TABS tablet Take 5 mg by mouth 2 (two) times daily.   Yes [provider]  aspirin 81 MG chewable tablet Chew 81 mg by mouth daily.   Yes [provider]  atorvastatin (LIPITOR) 80 MG tablet Take 80 mg by mouth daily. 12/02/18  Yes [provider]  brimonidine (ALPHAGAN) 0.15 % ophthalmic solution Place 1 drop into both eyes 3 (three) times daily. 12/20/17  Yes [provider]  cholecalciferol (VITAMIN D) 1000 units tablet Take 1,000 Units by mouth daily.   Yes [provider]  furosemide (LASIX) 40 MG tablet Take 40 mg by mouth See admin instructions. Take 1 tablet (40mg ) by mouth twice daily and 1 additional  tablet (40mg ) by mouth if needed for excessive swelling or edema   Yes [provider]  metoprolol (TOPROL-XL) 200 MG 24 hr tablet Take 200 mg by mouth daily.   Yes [provider]  spironolactone (ALDACTONE) 25 MG tablet Take 12.5 mg by mouth daily.   Yes [provider]  tiotropium (SPIRIVA HANDIHALER) 18 MCG inhalation capsule Place 1 capsule (18 mcg total) into inhaler and inhale daily for 30 days. 11/12/18 12/30/18 Yes Wieting, Richard, MD  budesonide-formoterol [facility]) 80-4.5 MCG/ACT inhaler Inhale 2 puffs into the lungs 2 (two) times daily. Patient not taking: Reported on 12/30/2018 11/12/18   [name], MD  colchicine 0.6 MG tablet Take 1 tablet (0.6 mg total) by mouth daily. Patient not taking: Reported on 12/30/2018 11/12/18 11/12/19  [name], MD  doxycycline (VIBRA-TABS) 100 MG tablet Take 1 tablet (100 mg total) by mouth every 12 (twelve) hours. Patient not taking: Reported on 12/30/2018 11/12/18   [name], MD  latanoprost (XALATAN) 0.005 % ophthalmic solution Place 1 drop into both eyes at bedtime. 12/20/17   [provider]  magnesium oxide (MAG-OX) 400 MG tablet Take 400 mg by mouth 2 (two) times daily.    [provider]  nitroGLYCERIN (NITROSTAT) 0.4 MG SL tablet Place 0.4 mg under the tongue every 5 (five) minutes as needed for chest pain.    [provider]    REVIEW OF SYSTEMS:  Review of Systems  Constitutional: Positive for malaise/fatigue. Negative for chills, fever and weight loss.  HENT: Negative for ear pain, hearing loss and tinnitus.   Eyes: Negative for blurred vision, double vision, pain and redness.  Respiratory: Negative for cough, hemoptysis and shortness of breath.   Cardiovascular: Negative for chest pain, palpitations, orthopnea and leg swelling.  Gastrointestinal: Negative for abdominal pain, constipation, diarrhea, nausea and vomiting.  Genitourinary: Negative for dysuria, frequency and  hematuria.  Musculoskeletal: Negative for back pain, joint pain and neck pain.  Skin:       No acne, rash, or lesions  Neurological: Negative for dizziness, tremors, focal weakness and weakness.  Endo/Heme/Allergies: Negative for polydipsia. Does not bruise/bleed easily.  Psychiatric/Behavioral: Negative for depression. The patient is not nervous/anxious and does not have insomnia.      VITAL SIGNS:   Vitals:   12/29/18 2230 12/29/18 2300 12/29/18 2330 12/29/18 2346  BP: 104/72 106/74 96/65 97/65   Pulse: 60 65 62 (!) 59  Resp: 16 16 15 20   Temp:      SpO2: 93% 94% 90% 92%  Weight:      Height:       Wt Readings from Last 3 Encounters:  12/29/18 99.8 kg  11/12/18 93.4 kg  09/22/18 97.6 kg    PHYSICAL EXAMINATION:  Physical Exam  Vitals reviewed. Constitutional: He  is oriented to person, place, and time. He appears well-developed and well-nourished. No distress.  HENT:  Head: Normocephalic and atraumatic.  Mouth/Throat: Oropharynx is clear and moist.  Eyes: Pupils are equal, round, and reactive to light. Conjunctivae and EOM are normal. No scleral icterus.  Neck: Normal range of motion. Neck supple. No JVD present. No thyromegaly present.  Cardiovascular: Normal rate, regular rhythm and intact distal pulses. Exam reveals no gallop and no friction rub.  No murmur heard. Respiratory: Effort normal and breath sounds normal. No respiratory distress. He has no wheezes. He has no rales.  GI: Soft. Bowel sounds are normal. He exhibits no distension. There is no abdominal tenderness.  Musculoskeletal: Normal range of motion.        General: No edema.     Comments: No arthritis, no gout  Lymphadenopathy:    He has no cervical adenopathy.  Neurological: He is alert and oriented to person, place, and time. No cranial nerve deficit.  No dysarthria, no aphasia  Skin: Skin is warm and dry. No rash noted. No erythema.  Psychiatric: He has a normal mood and affect. His behavior is  normal. Judgment and thought content normal.    LABORATORY PANEL:   CBC Recent Labs  Lab 12/29/18 2044  WBC 8.1  8.5  HGB 12.2*  12.1*  HCT 37.2*  37.5*  PLT 239  247   ------------------------------------------------------------------------------------------------------------------  Chemistries  Recent Labs  Lab 12/29/18 2119 12/29/18 2302  NA  --  141  K  --  3.8  CL  --  103  CO2  --  22  GLUCOSE  --  98  BUN  --  54*  CREATININE  --  2.15*  CALCIUM  --  8.5*  AST 160*  --   ALT 130*  --   ALKPHOS 97  --   BILITOT 1.1  --    ------------------------------------------------------------------------------------------------------------------  Cardiac Enzymes Recent Labs  Lab 12/29/18 2119  TROPONINI 0.13*   ------------------------------------------------------------------------------------------------------------------  RADIOLOGY:  Dg Chest Portable 1 View  Result Date: 12/29/2018 CLINICAL DATA:  Weakness with apparent hypotension EXAM: PORTABLE CHEST 1 VIEW COMPARISON:  Chest radiograph November 05, 2018 and chest CT November 10, 2018 FINDINGS: There is atelectatic change in the left mid lung. There is no appreciable edema or consolidation. Heart is mildly enlarged with pulmonary vascularity normal. Pacemaker leads are attached the right atrium and right ventricle. There is aortic atherosclerosis. No adenopathy. No bone lesions. There is calcification in each carotid artery. IMPRESSION: Left midlung atelectasis. No edema or consolidation. Mild cardiomegaly. Pacemaker leads attached to right atrium and right ventricle. Aortic Atherosclerosis (ICD10-I70.0). Bilateral carotid artery calcification noted. Electronically Signed   By: Lowella Grip III M.D.   On: 12/29/2018 21:14    EKG:   Orders placed or performed during the hospital encounter of 12/29/18  . ED EKG  . ED EKG  . EKG 12-Lead  . EKG 12-Lead  . ED EKG  . ED EKG    IMPRESSION AND PLAN:   Principal Problem:   Acute on chronic renal failure (HCC) -suspect this is potentially related to recent alcohol use leading to dehydration.  IV fluids given in the ED.  Initial plan was to avoid nephrotoxins, including diuresis.  However patient became short of breath with IV fluids given in the ED.  We will repeat chest x-ray and give some IV Lasix. Active Problems:   COPD (chronic obstructive pulmonary disease) (HCC) -home dose inhalers   Chronic diastolic heart  failure ([place]) -continue home meds   HTN (hypertension) -home dose antihypertensives   CAD (coronary artery disease) -continue home medications   Alcoholic intoxication without complication ([place]) -CIWA protocol  Chart review performed and case discussed with ED provider. Labs, imaging and/or ECG reviewed by provider and discussed with patient/family. Management plans discussed with the patient and/or family.  DVT PROPHYLAXIS: Systemic anticoagulation  GI PROPHYLAXIS:  None  ADMISSION STATUS: Observation  CODE STATUS: Full Code Status History    Date Active Date Inactive Code Status Order ID Comments User Context   11/03/2018 2032 11/12/2018 2045 Full Code [id number]  Gladstone Lighter, MD Inpatient   08/30/2018 1045 09/02/2018 1616 Full Code [id number]  Fritzi Mandes, MD Inpatient   08/22/2018 1435 08/25/2018 2257 Full Code [id number]  Fritzi Mandes, MD Inpatient   01/08/2018 1523 01/11/2018 1712 Full Code [id number]  Nicholes Mango, MD ED   11/18/2017 2227 11/23/2017 2033 Full Code [id number]  Vaughan Basta, MD Inpatient   10/21/2016 1216 10/26/2016 2247 Full Code [id number]  Bettey Costa, MD Inpatient      TOTAL TIME TAKING CARE OF THIS PATIENT: 40 minutes.   Ethlyn Daniels 12/30/2018, 12:31 AM  [facility]  Office  [phone]  CC: Primary care physician; [employer]  Note:  This document was prepared using Systems analyst and may include unintentional dictation errors.

## 2018-12-31 ENCOUNTER — Observation Stay: Payer: Medicare HMO

## 2018-12-31 LAB — BASIC METABOLIC PANEL
Anion gap: 8 (ref 5–15)
BUN: 47 mg/dL — ABNORMAL HIGH (ref 8–23)
CO2: 27 mmol/L (ref 22–32)
Calcium: 8.6 mg/dL — ABNORMAL LOW (ref 8.9–10.3)
Chloride: 102 mmol/L (ref 98–111)
Creatinine, Ser: 1.82 mg/dL — ABNORMAL HIGH (ref 0.61–1.24)
GFR calc Af Amer: 41 mL/min — ABNORMAL LOW (ref >60)
GFR calc non Af Amer: 36 mL/min — ABNORMAL LOW (ref >60)
Glucose, Bld: 158 mg/dL — ABNORMAL HIGH (ref 70–99)
Potassium: 3.9 mmol/L (ref 3.5–5.1)
Sodium: 137 mmol/L (ref 135–145)

## 2018-12-31 LAB — MAGNESIUM: Magnesium: 2.5 mg/dL — ABNORMAL HIGH (ref 1.7–2.4)

## 2018-12-31 NOTE — Progress Notes (Addendum)
[place] at [place] NAME: Massey Ruhland    MR#:  [id number]  DATE OF BIRTH:  01-20-1946  SUBJECTIVE:  CHIEF COMPLAINT:   Chief Complaint  Patient presents with  . Weakness   No new complaint this morning.  Patient noted to have been hypoxic and had to be placed on 3 L of oxygen with oxygen saturation of 92%.  Patient reports using 2 L of oxygen as needed at home.  No fevers.  REVIEW OF SYSTEMS:  Review of Systems  Constitutional: Negative for chills and fever.  HENT: Negative for hearing loss and tinnitus.   Eyes: Negative for blurred vision and double vision.  Respiratory: Negative for cough and shortness of breath.   Cardiovascular: Negative for chest pain and palpitations.  Gastrointestinal: Negative for heartburn and nausea.  Genitourinary: Negative for dysuria and urgency.  Musculoskeletal: Negative for neck pain.  Skin: Negative for itching and rash.  Neurological: Negative for dizziness.  Psychiatric/Behavioral: Negative for depression and hallucinations.    DRUG ALLERGIES:  No Known Allergies VITALS:  Blood pressure 138/83, pulse 64, temperature 98.8 F (37.1 C), temperature source Oral, resp. rate 18, height 5\' 11"  (1.803 m), weight 98.9 kg, SpO2 91 %. PHYSICAL EXAMINATION:  Physical Exam  Physical Exam  Constitutional: He is oriented to person, place, and time. He appears well-developed.  HENT:  Head: Normocephalic and atraumatic.  Right Ear: External ear normal.  Eyes: Pupils are equal, round, and reactive to light. Conjunctivae are normal. Right eye exhibits no discharge.  Neck: Normal range of motion. Neck supple. No tracheal deviation present.  Cardiovascular:  Irregularly irregular  Respiratory: Effort normal and breath sounds normal. He has no rales.  GI: Soft. Bowel sounds are normal.  Musculoskeletal: Normal range of motion.        General: No edema.  Neurological: He is alert and oriented to person,  place, and time.  Skin: Skin is warm. He is not diaphoretic. No erythema.  Psychiatric: He has a normal mood and affect. His behavior is normal.   LABORATORY PANEL:  Male CBC Recent Labs  Lab 12/30/18 0513  WBC 9.9  HGB 10.6*  HCT 32.8*  PLT 210   ------------------------------------------------------------------------------------------------------------------ Chemistries  Recent Labs  Lab 12/29/18 2119  12/31/18 0254  NA  --    < > 137  K  --    < > 3.9  CL  --    < > 102  CO2  --    < > 27  GLUCOSE  --    < > 158*  BUN  --    < > 47*  CREATININE  --    < > 1.82*  CALCIUM  --    < > 8.6*  MG  --   --  2.5*  AST 160*  --   --   ALT 130*  --   --   ALKPHOS 97  --   --   BILITOT 1.1  --   --    < > = values in this interval not displayed.   RADIOLOGY:  Dg Chest 1 View  Result Date: 12/31/2018 CLINICAL DATA:  SOB. EXAM: CHEST  1 VIEW COMPARISON:  12/30/2018. FINDINGS: Marked cardiac enlargement. Dual lead pacer/AICD. Mild vascular congestion. No overt edema. Chronic elevation LEFT hemidiaphragm. No pneumothorax. Aortic atherosclerosis. IMPRESSION: Cardiomegaly. Mild vascular congestion without overt edema. Stable appearance from priors. Electronically Signed   By: Staci Righter M.D.   On:  12/31/2018 08:20   ASSESSMENT AND PLAN:   1.  Acute kidney injury superimposed on chronic kidney disease stage III Likely due to recent alcohol use leading to dehydration.  Renal function improved slightly with initial IV fluid hydration.  IV fluid had to be discontinued in the emergency room due to patient being short of breath.  Chest x-ray done showed mild pulmonary vascular congestion.  Was given a dose of IV Lasix in the emergency room.   Renal function gradually improving.  Avoid nephrotoxic agents   2.  Alcohol intoxication Patient clinically stable this morning.  Alcohol level was 122.  Patient denies drinking alcohol on a daily basis. Monitor for any signs of alcohol withdrawal.   Continue thiamine, multivitamin and folic acid  3.  Chronic diastolic CHF Stable.  Will resume Lasix in a.m. if renal function stable  4.  CAD Stable.  5.  COPD Stable No continue nebs  6.  Paroxysmal atrial fibrillation Rate controlled Patient on anticoagulation with Eliquis  7.  Chronic hypoxic respiratory failure Patient uses 2 L of home oxygen therapy at home.  Noted to have been requiring 3 L this morning but not in any respiratory distress.  Start chest x-ray done this morning stable compared to prior studies  8 generalized weakness Physical therapist evaluated patient and recommended home health with physical therapy.  DVT prophylaxis; patient on Eliquis  Disposition; plans to discharge home tomorrow if respiratory status improves and remains stable  All the records are reviewed and case discussed with Care Management/Social Worker. Management plans discussed with the patient, family and they are in agreement.  CODE STATUS: Full Code  TOTAL TIME TAKING CARE OF THIS PATIENT: 35 minutes.   More than 50% of the time was spent in counseling/coordination of care: YES  POSSIBLE D/C IN 1DAY, DEPENDING ON CLINICAL CONDITION.   Derrius Furtick M.D on 12/31/2018 at 1:24 PM  Between 7am to 6pm - Pager - [phone]  After 6pm go to [email or web address] - [employer] [facility]  Office  [phone]  CC: Primary care physician; [employer]  Note: This dictation was prepared with Diplomatic Services operational officer dictation along with smaller Company secretary. Any transcriptional errors that result from this process are unintentional.

## 2018-12-31 NOTE — Progress Notes (Signed)
Physical Therapy Treatment Patient Details Name: Darius Taylor MRN: [id number] DOB: 05-20-1946 Today's Date: 12/31/2018    History of Present Illness 73 y.o. male with a known history of atrial fibrillation on eliquis, CAD, nonischemic cardiomyopathy with last known EF of 25% status post AICD and pacemaker, CKD, history of colonic adenocarcinoma status post partial colectomy, history of renal cell carcinoma status post left nephrectomy, COPD on as needed home oxygen presented to the hospital secondary to worsening ED with a complaint of weakness and fatigue.  He was found here to have an elevated creatinine he has elevated troponin and BNP.    PT Comments    Patient continues to increase ambulation distance with difficulty with O2 monitoring. PT explained the importance of energy conservation and pursed lip breathing on this, which he verbalized and demonstrated understanding of. Patient's O2 continues to drop into the low 80s with ambulation, but patient is able to raise this to 89% through cuing and rest. Patient is able to complete seated therex with accuracy following cuing and maintain O2 above 90%. Would benefit from skilled PT to address above deficits and promote optimal return to PLOF   Follow Up Recommendations  Home health PT     Equipment Recommendations  None recommended by PT    Recommendations for Other Services       Precautions / Restrictions Precautions Precautions: Fall Restrictions Weight Bearing Restrictions: No    Mobility  Bed Mobility Overal bed mobility: Independent             General bed mobility comments: Pt able to get to sitting EOB w/o assist  Transfers Overall transfer level: Independent Equipment used: Rolling walker (2 wheeled)             General transfer comment: Pt did well getting to standing w/o assist, showed good cofidence with walker and had no overt safety issues  Ambulation/Gait Ambulation/Gait assistance: Modified  independent (Device/Increase time) Gait Distance (Feet): 75 Feet Assistive device: Rolling walker (2 wheeled)   Gait velocity: decreased   General Gait Details: Able to ambulate 41ft in room with good technique. Patient with 02 sat to 83, which he is able to raise to 89 with pursed lip breathing education and standing rest breaks.    Stairs             Wheelchair Mobility    Modified Rankin (Stroke Patients Only)       Balance                                            Cognition Arousal/Alertness: Awake/alert Behavior During Therapy: WFL for tasks assessed/performed Overall Cognitive Status: Within Functional Limits for tasks assessed                                        Exercises General Exercises - Lower Extremity Long Arc Quad: AROM;Other reps (comment)(3x 10 with eccentric lowering) Hip Flexion/Marching: AROM;Other reps (comment)(3x 10) Other Exercises Other Exercises: Patient able to demonstrate good carry over of RW management with STS transfers. Patient able to ambulate throughout room over 37ft with confedience. Patient O2 sat to 83% on 2 occassions with ambulation, PT advised patient to rest and demonstrated pursed lip breathing with good follow up from patient and increased O2 to 89%.  During seated exercises patient's O2 remained above 90%    General Comments        Pertinent Vitals/Pain Pain Assessment: No/denies pain    Home Living                      Prior Function            PT Goals (current goals can now be found in the care plan section) Acute Rehab PT Goals Patient Stated Goal: go home PT Goal Formulation: With patient/family Time For Goal Achievement: 01/13/19 Potential to Achieve Goals: Good Progress towards PT goals: Progressing toward goals    Frequency    Min 2X/week      PT Plan      Co-evaluation              AM-PAC PT "6 Clicks" Mobility   Outcome Measure  Help  needed turning from your back to your side while in a flat bed without using bedrails?: None Help needed moving from lying on your back to sitting on the side of a flat bed without using bedrails?: None Help needed moving to and from a bed to a chair (including a wheelchair)?: None Help needed standing up from a chair using your arms (e.g., wheelchair or bedside chair)?: None Help needed to walk in hospital room?: None Help needed climbing 3-5 steps with a railing? : A Little 6 Click Score: 23    End of Session Equipment Utilized During Treatment: Gait belt;Oxygen Activity Tolerance: Patient limited by fatigue Patient left: in chair;with call bell/phone within reach;with chair alarm set Nurse Communication: Mobility status PT Visit Diagnosis: Muscle weakness (generalized) (M62.81);Difficulty in walking, not elsewhere classified (R26.2)     Time: 2585-2778 PT Time Calculation (min) (ACUTE ONLY): 29 min  Charges:  $Gait Training: 8-22 mins $Therapeutic Activity: 8-22 mins                     Shelton Silvas PT, DPT   [place] 12/31/2018, 4:00 PM

## 2018-12-31 NOTE — Progress Notes (Signed)
With history of COPD, had 02 sats consistently in between 88-90%. Dr. Jannifer Franklin notified and parameters set for 88-92%. Patient starting having saturations of 86% and oxygen set to 3L. Patient currently at 92%. Will continue to monitor.

## 2019-01-01 LAB — BASIC METABOLIC PANEL
Anion gap: 9 (ref 5–15)
BUN: 35 mg/dL — ABNORMAL HIGH (ref 8–23)
CO2: 28 mmol/L (ref 22–32)
Calcium: 9 mg/dL (ref 8.9–10.3)
Chloride: 101 mmol/L (ref 98–111)
Creatinine, Ser: 1.58 mg/dL — ABNORMAL HIGH (ref 0.61–1.24)
GFR calc Af Amer: 49 mL/min — ABNORMAL LOW (ref >60)
GFR calc non Af Amer: 42 mL/min — ABNORMAL LOW (ref >60)
Glucose, Bld: 104 mg/dL — ABNORMAL HIGH (ref 70–99)
Potassium: 4.4 mmol/L (ref 3.5–5.1)
Sodium: 138 mmol/L (ref 135–145)

## 2019-01-01 LAB — MAGNESIUM: Magnesium: 2.5 mg/dL — ABNORMAL HIGH (ref 1.7–2.4)

## 2019-01-01 MED ORDER — PROMETHAZINE HCL 25 MG/ML IJ SOLN
12.5000 mg | Freq: Once | INTRAMUSCULAR | Status: AC | PRN
  Administered 2019-01-01: 12.5 mg via INTRAVENOUS
  Filled 2019-01-01: qty 1

## 2019-01-01 MED ORDER — FUROSEMIDE 20 MG PO TABS: 20.0000 mg | ORAL_TABLET | Freq: Every day | ORAL | Status: DC

## 2019-01-01 MED ORDER — FUROSEMIDE 10 MG/ML IJ SOLN
20.0000 mg | Freq: Once | INTRAMUSCULAR | Status: AC
  Administered 2019-01-01: 20 mg via INTRAVENOUS
  Filled 2019-01-01: qty 2

## 2019-01-01 MED ORDER — FUROSEMIDE 40 MG PO TABS: 40.0000 mg | ORAL_TABLET | Freq: Every day | ORAL | Status: DC

## 2019-01-01 MED ORDER — DOCUSATE SODIUM 100 MG PO CAPS
100.0000 mg | ORAL_CAPSULE | Freq: Every day | ORAL | Status: DC
  Administered 2019-01-02 – 2019-01-20 (×17): 100 mg via ORAL
  Filled 2019-01-01 (×19): qty 1

## 2019-01-01 MED ORDER — ALBUTEROL SULFATE (2.5 MG/3ML) 0.083% IN NEBU
2.5000 mg | INHALATION_SOLUTION | RESPIRATORY_TRACT | Status: DC | PRN
  Administered 2019-01-01 – 2019-01-02 (×3): 2.5 mg via RESPIRATORY_TRACT
  Filled 2019-01-01 (×3): qty 3

## 2019-01-01 NOTE — Progress Notes (Addendum)
Darius Taylor at [place] NAME: Darius Taylor    MR#:  [id number]  DATE OF BIRTH:  28-Jul-1946  SUBJECTIVE:  CHIEF COMPLAINT:   Chief Complaint  Patient presents with  . Weakness    Patient is  hypoxic and requiring 5 L of oxygen today.  Short of breath this morning but after breathing treatment started feeling better patient reports using 2 L of oxygen as needed at home.  No fevers.  Reports decreased appetite  REVIEW OF SYSTEMS:  Review of Systems  Constitutional: Negative for chills and fever.  HENT: Negative for hearing loss and tinnitus.   Eyes: Negative for blurred vision and double vision.  Respiratory: Negative for cough and shortness of breath.   Cardiovascular: Negative for chest pain and palpitations.  Gastrointestinal: Negative for heartburn and nausea.  Genitourinary: Negative for dysuria and urgency.  Musculoskeletal: Negative for neck pain.  Skin: Negative for itching and rash.  Neurological: Negative for dizziness.  Psychiatric/Behavioral: Negative for depression and hallucinations.    DRUG ALLERGIES:  No Known Allergies VITALS:  Blood pressure 117/90, pulse 72, temperature 98.3 F (36.8 C), temperature source Oral, resp. rate 17, height 5\' 11"  (1.803 m), weight 99.2 kg, SpO2 93 %. PHYSICAL EXAMINATION:  Physical Exam  Physical Exam  Constitutional: He is oriented to person, place, and time. He appears well-developed.  HENT:  Head: Normocephalic and atraumatic.  Right Ear: External ear normal.  Eyes: Pupils are equal, round, and reactive to light. Conjunctivae are normal. Right eye exhibits no discharge.  Neck: Normal range of motion. Neck supple. No tracheal deviation present.  Cardiovascular:  Irregularly irregular  Respiratory: Effort normal and breath sounds normal. He has no rales.  GI: Soft. Bowel sounds are normal.  Musculoskeletal: Normal range of motion.        General: No edema.  Neurological: He is  alert and oriented to person, place, and time.  Skin: Skin is warm. He is not diaphoretic. No erythema.  Psychiatric: He has a normal mood and affect. His behavior is normal.   LABORATORY PANEL:  Male CBC Recent Labs  Lab 12/30/18 0513  WBC 9.9  HGB 10.6*  HCT 32.8*  PLT 210   ------------------------------------------------------------------------------------------------------------------ Chemistries  Recent Labs  Lab 12/29/18 2119  01/01/19 0550  NA  --    < > 138  K  --    < > 4.4  CL  --    < > 101  CO2  --    < > 28  GLUCOSE  --    < > 104*  BUN  --    < > 35*  CREATININE  --    < > 1.58*  CALCIUM  --    < > 9.0  MG  --    < > 2.5*  AST 160*  --   --   ALT 130*  --   --   ALKPHOS 97  --   --   BILITOT 1.1  --   --    < > = values in this interval not displayed.   RADIOLOGY:  No results found. ASSESSMENT AND PLAN:   1.  Acute kidney injury superimposed on chronic kidney disease stage III Likely due to recent alcohol use leading to dehydration.  Renal function improved slightly with initial IV fluid hydration.  IV fluid had to be discontinued in the emergency room due to patient being short of breath.  Chest x-ray done showed mild  pulmonary vascular congestion.  Was given a dose of IV Lasix in the emergency room.   Renal function gradually improving.  Avoid nephrotoxic agents   2.  Alcohol intoxication Patient clinically stable this morning.  Alcohol level was 122.  Patient denies drinking alcohol on a daily basis. Monitor for any signs of alcohol withdrawal.  Continue thiamine, multivitamin and folic acid  3.  Mild acute on chronic diastolic CHF Chest x-ray with mild vascular congestion without overt edema We will start the patient on 20 mg of IV Lasix as renal function is improving Wean down oxygen to baseline 2 L   4.  CAD Stable.  5.  COPD Stable No continue nebs  6.  Paroxysmal atrial fibrillation Rate controlled Patient on anticoagulation with  Eliquis  7.  Chronic hypoxic respiratory failure Patient uses 2 L of home oxygen therapy at home.  Currently on 5 L of oxygen  8 generalized weakness Physical therapist evaluated patient and recommended home health with physical therapy.  9.  Decreased appetite dietitian consult  DVT prophylaxis; patient on Eliquis  Disposition; plans to discharge home tomorrow if respiratory status improves and remains stable  All the records are reviewed and case discussed with Care Management/Social Worker. Management plans discussed with the patient, he is in agreement.  CODE STATUS: Full Code  TOTAL TIME TAKING CARE OF THIS PATIENT: 35 minutes.   More than 50% of the time was spent in counseling/coordination of care: YES  POSSIBLE D/C IN 1DAY, DEPENDING ON CLINICAL CONDITION.   Nicholes Mango M.D on 01/01/2019 at 2:28 PM  Between 7am to 6pm - Pager - [phone]  After 6pm go to [email or web address] - Technical brewer [facility]  Office  [phone]  CC: Primary care physician; [employer]  Note: This dictation was prepared with Diplomatic Services operational officer dictation along with smaller Company secretary. Any transcriptional errors that result from this process are unintentional.

## 2019-01-01 NOTE — Progress Notes (Cosign Needed)
Physical Therapy Treatment Patient Details Name: Darius Taylor MRN: [id number] DOB: 07-07-1946 Today's Date: 01/01/2019    History of Present Illness 73 y.o. male with a known history of atrial fibrillation on eliquis, CAD, nonischemic cardiomyopathy with last known EF of 25% status post AICD and pacemaker, CKD, history of colonic adenocarcinoma status post partial colectomy, history of renal cell carcinoma status post left nephrectomy, COPD on as needed home oxygen presented to the hospital secondary to worsening ED with a complaint of weakness and fatigue.  He was found here to have an elevated creatinine he has elevated troponin and BNP.    PT Comments    Pt showed good overall effort t/o the PT session and despite needing 6L O2 to maintain sats in the high 80s he showed good cadence, safety, strength, etc with PT activities.  He needed only 2 brief standing rest breaks during 125 ft of ambulation, cuing for breathing and consistence cadence.  Pt showed good strength and effort with exercises, though he did have some fatigue with these and again needed brief rest breaks.  Pt very pleasant and eager to work with PT, should be safe to return home with HHPT once medically stable, though he will likely have greater O2 demand than PLOF.   Follow Up Recommendations  Home health PT     Equipment Recommendations       Recommendations for Other Services       Precautions / Restrictions Precautions Precautions: Fall Restrictions Weight Bearing Restrictions: No    Mobility  Bed Mobility Overal bed mobility: Modified Independent             General bed mobility comments: Pt needed rails to get to sitting, but did not need assist  Transfers Overall transfer level: Modified independent Equipment used: Rolling walker (2 wheeled)             General transfer comment: Pt did well getting to standing w/o assist, minimal cuing to use UEs to control  descent  Ambulation/Gait Ambulation/Gait assistance: Modified independent (Device/Increase time) Gait Distance (Feet): 125 Feet Assistive device: Rolling walker (2 wheeled)       General Gait Details: Pt ambulated ~125 ft in the hallway with relatively consistent cadence and moderate reliance on the walker.  He did require 6L O2 t/o the effort with sats generally staying in the upper 80s (no significant change from start of session).   Stairs             Wheelchair Mobility    Modified Rankin (Stroke Patients Only)       Balance Overall balance assessment: Modified Independent                                          Cognition Arousal/Alertness: Awake/alert Behavior During Therapy: WFL for tasks assessed/performed Overall Cognitive Status: Within Functional Limits for tasks assessed                                        Exercises General Exercises - Lower Extremity Ankle Circles/Pumps: AROM;15 reps;Both Long Arc Quad: Strengthening;15 reps;Both Heel Slides: Strengthening;15 reps;Both Hip ABduction/ADduction: Strengthening;15 reps;Both Hip Flexion/Marching: Strengthening;15 reps;Both    General Comments        Pertinent Vitals/Pain Pain Assessment: No/denies pain    Home Living  Prior Function            PT Goals (current goals can now be found in the care plan section) Progress towards PT goals: Progressing toward goals    Frequency    Min 2X/week      PT Plan Current plan remains appropriate    Co-evaluation              AM-PAC PT "6 Clicks" Mobility   Outcome Measure  Help needed turning from your back to your side while in a flat bed without using bedrails?: None Help needed moving from lying on your back to sitting on the side of a flat bed without using bedrails?: None Help needed moving to and from a bed to a chair (including a wheelchair)?: None Help needed  standing up from a chair using your arms (e.g., wheelchair or bedside chair)?: None Help needed to walk in hospital room?: None Help needed climbing 3-5 steps with a railing? : A Little 6 Click Score: 23    End of Session Equipment Utilized During Treatment: Gait belt;Oxygen(6L on arrival, donned t/o ambulation) Activity Tolerance: Patient limited by fatigue;Patient tolerated treatment well Patient left: with call bell/phone within reach;with chair alarm set   PT Visit Diagnosis: Muscle weakness (generalized) (M62.81);Difficulty in walking, not elsewhere classified (R26.2)     Time: 9371-6967 PT Time Calculation (min) (ACUTE ONLY): 24 min  Charges:  $Gait Training: 8-22 mins $Therapeutic Exercise: 8-22 mins                     Kreg Shropshire, DPT 01/01/2019, 4:15 PM

## 2019-01-01 NOTE — Care Management Obs Status (Signed)
[facility] NOTIFICATION   Patient Details  Name: Darius Taylor MRN: [id number] Date of Birth: 10/01/1945   Medicare Observation Status Notification Given:  Yes    Marty Sadlowski Jen Mow, RN 01/01/2019, 9:35 AM

## 2019-01-02 ENCOUNTER — Inpatient Hospital Stay: Payer: Medicare HMO

## 2019-01-02 DIAGNOSIS — I2510 Atherosclerotic heart disease of native coronary artery without angina pectoris: Secondary | ICD-10-CM | POA: Diagnosis present

## 2019-01-02 DIAGNOSIS — I350 Nonrheumatic aortic (valve) stenosis: Secondary | ICD-10-CM | POA: Diagnosis present

## 2019-01-02 DIAGNOSIS — Y95 Nosocomial condition: Secondary | ICD-10-CM | POA: Diagnosis not present

## 2019-01-02 DIAGNOSIS — I158 Other secondary hypertension: Secondary | ICD-10-CM | POA: Diagnosis present

## 2019-01-02 DIAGNOSIS — D638 Anemia in other chronic diseases classified elsewhere: Secondary | ICD-10-CM | POA: Diagnosis present

## 2019-01-02 DIAGNOSIS — N179 Acute kidney failure, unspecified: Secondary | ICD-10-CM | POA: Diagnosis present

## 2019-01-02 DIAGNOSIS — I130 Hypertensive heart and chronic kidney disease with heart failure and stage 1 through stage 4 chronic kidney disease, or unspecified chronic kidney disease: Secondary | ICD-10-CM | POA: Diagnosis present

## 2019-01-02 DIAGNOSIS — R300 Dysuria: Secondary | ICD-10-CM | POA: Diagnosis present

## 2019-01-02 DIAGNOSIS — E860 Dehydration: Secondary | ICD-10-CM | POA: Diagnosis present

## 2019-01-02 DIAGNOSIS — Y906 Blood alcohol level of 120-199 mg/100 ml: Secondary | ICD-10-CM | POA: Diagnosis present

## 2019-01-02 DIAGNOSIS — N170 Acute kidney failure with tubular necrosis: Secondary | ICD-10-CM | POA: Diagnosis not present

## 2019-01-02 DIAGNOSIS — Z85528 Personal history of other malignant neoplasm of kidney: Secondary | ICD-10-CM | POA: Diagnosis not present

## 2019-01-02 DIAGNOSIS — E1122 Type 2 diabetes mellitus with diabetic chronic kidney disease: Secondary | ICD-10-CM | POA: Diagnosis present

## 2019-01-02 DIAGNOSIS — J9621 Acute and chronic respiratory failure with hypoxia: Secondary | ICD-10-CM | POA: Diagnosis present

## 2019-01-02 DIAGNOSIS — I480 Paroxysmal atrial fibrillation: Secondary | ICD-10-CM | POA: Diagnosis present

## 2019-01-02 DIAGNOSIS — R195 Other fecal abnormalities: Secondary | ICD-10-CM | POA: Diagnosis not present

## 2019-01-02 DIAGNOSIS — N183 Chronic kidney disease, stage 3 (moderate): Secondary | ICD-10-CM | POA: Diagnosis present

## 2019-01-02 DIAGNOSIS — I428 Other cardiomyopathies: Secondary | ICD-10-CM | POA: Diagnosis present

## 2019-01-02 DIAGNOSIS — I272 Pulmonary hypertension, unspecified: Secondary | ICD-10-CM | POA: Diagnosis present

## 2019-01-02 DIAGNOSIS — K5900 Constipation, unspecified: Secondary | ICD-10-CM | POA: Diagnosis not present

## 2019-01-02 DIAGNOSIS — Z20828 Contact with and (suspected) exposure to other viral communicable diseases: Secondary | ICD-10-CM | POA: Diagnosis present

## 2019-01-02 DIAGNOSIS — J189 Pneumonia, unspecified organism: Secondary | ICD-10-CM | POA: Diagnosis not present

## 2019-01-02 DIAGNOSIS — R531 Weakness: Secondary | ICD-10-CM | POA: Diagnosis present

## 2019-01-02 DIAGNOSIS — N184 Chronic kidney disease, stage 4 (severe): Secondary | ICD-10-CM | POA: Diagnosis not present

## 2019-01-02 DIAGNOSIS — I5043 Acute on chronic combined systolic (congestive) and diastolic (congestive) heart failure: Secondary | ICD-10-CM | POA: Diagnosis present

## 2019-01-02 DIAGNOSIS — J181 Lobar pneumonia, unspecified organism: Secondary | ICD-10-CM | POA: Diagnosis not present

## 2019-01-02 DIAGNOSIS — J9601 Acute respiratory failure with hypoxia: Secondary | ICD-10-CM | POA: Diagnosis not present

## 2019-01-02 DIAGNOSIS — J810 Acute pulmonary edema: Secondary | ICD-10-CM | POA: Diagnosis not present

## 2019-01-02 DIAGNOSIS — Z95810 Presence of automatic (implantable) cardiac defibrillator: Secondary | ICD-10-CM | POA: Diagnosis not present

## 2019-01-02 DIAGNOSIS — J440 Chronic obstructive pulmonary disease with acute lower respiratory infection: Secondary | ICD-10-CM | POA: Diagnosis present

## 2019-01-02 DIAGNOSIS — F10120 Alcohol abuse with intoxication, uncomplicated: Secondary | ICD-10-CM | POA: Diagnosis present

## 2019-01-02 DIAGNOSIS — I5032 Chronic diastolic (congestive) heart failure: Secondary | ICD-10-CM | POA: Diagnosis not present

## 2019-01-02 DIAGNOSIS — Z85038 Personal history of other malignant neoplasm of large intestine: Secondary | ICD-10-CM | POA: Diagnosis not present

## 2019-01-02 LAB — CBC
HCT: 32.2 % — ABNORMAL LOW (ref 39.0–52.0)
Hemoglobin: 10.1 g/dL — ABNORMAL LOW (ref 13.0–17.0)
MCH: 30.6 pg (ref 26.0–34.0)
MCHC: 31.4 g/dL (ref 30.0–36.0)
MCV: 97.6 fL (ref 80.0–100.0)
Platelets: 158 10*3/uL (ref 150–400)
RBC: 3.3 MIL/uL — ABNORMAL LOW (ref 4.22–5.81)
RDW: 15.8 % — ABNORMAL HIGH (ref 11.5–15.5)
WBC: 9.6 10*3/uL (ref 4.0–10.5)
nRBC: 0 % (ref 0.0–0.2)

## 2019-01-02 LAB — BASIC METABOLIC PANEL
Anion gap: 9 (ref 5–15)
BUN: 30 mg/dL — ABNORMAL HIGH (ref 8–23)
CO2: 25 mmol/L (ref 22–32)
Calcium: 8.9 mg/dL (ref 8.9–10.3)
Chloride: 100 mmol/L (ref 98–111)
Creatinine, Ser: 1.57 mg/dL — ABNORMAL HIGH (ref 0.61–1.24)
GFR calc Af Amer: 50 mL/min — ABNORMAL LOW (ref >60)
GFR calc non Af Amer: 43 mL/min — ABNORMAL LOW (ref >60)
Glucose, Bld: 156 mg/dL — ABNORMAL HIGH (ref 70–99)
Potassium: 5.3 mmol/L — ABNORMAL HIGH (ref 3.5–5.1)
Sodium: 134 mmol/L — ABNORMAL LOW (ref 135–145)

## 2019-01-02 LAB — URIC ACID: Uric Acid, Serum: 5.4 mg/dL (ref 3.7–8.6)

## 2019-01-02 MED ORDER — FUROSEMIDE 10 MG/ML IJ SOLN
40.0000 mg | Freq: Two times a day (BID) | INTRAMUSCULAR | Status: DC
  Administered 2019-01-02 – 2019-01-04 (×3): 40 mg via INTRAVENOUS
  Filled 2019-01-02 (×3): qty 4

## 2019-01-02 MED ORDER — FUROSEMIDE 10 MG/ML IJ SOLN: 40.0000 mg | Freq: Two times a day (BID) | INTRAMUSCULAR | Status: DC

## 2019-01-02 MED ORDER — SODIUM ZIRCONIUM CYCLOSILICATE 5 G PO PACK
5.0000 g | PACK | Freq: Once | ORAL | Status: AC
  Administered 2019-01-02: 5 g via ORAL
  Filled 2019-01-02 (×2): qty 1

## 2019-01-02 MED ORDER — FUROSEMIDE 10 MG/ML IJ SOLN: 20.0000 mg | Freq: Two times a day (BID) | INTRAMUSCULAR | Status: DC

## 2019-01-02 MED ORDER — NEPRO/CARBSTEADY PO LIQD
237.0000 mL | ORAL | Status: DC
  Administered 2019-01-02 – 2019-01-21 (×17): 237 mL via ORAL

## 2019-01-02 MED ORDER — GUAIFENESIN-DM 100-10 MG/5ML PO SYRP
5.0000 mL | ORAL_SOLUTION | ORAL | Status: DC | PRN
  Administered 2019-01-02: 06:00:00 5 mL via ORAL
  Filled 2019-01-02 (×2): qty 5

## 2019-01-02 NOTE — Plan of Care (Signed)

## 2019-01-02 NOTE — Consult Note (Signed)
[facility] Consultation Note  Patient ID: Darius Taylor, MRN: [id number], DOB/AGE: 28-Sep-1945 73 y.o. Admit date: 12/29/2018   Date of Consult: 01/02/2019 Primary Physician: [facility], [place] Primary Cardiologist: [facility]  Chief Complaint:  Chief Complaint  Patient presents with  . Weakness   Reason for Consult: Congestive heart failure  HPI: 73 y.o. male with known diastolic dysfunction congestive heart failure likely secondary to history left ventricular hypertrophy and aortic valve stenosis status post TAVR in 2018 successfully.  At that time the patient had a cardiac catheterization showing minimal coronary atherosclerosis without evidence of previous infarction.  The patient also had paroxysmal nonvalvular atrial fibrillation with appropriate treatment in the past and currently has not had any atrial fibrillation during this hospitalization.  Pacemaker defibrillator was placed of an unknown reason in the past but currently EKG shows atrial pacing with left axis deviation and poor R wave progression.  The patient also has had chronic kidney disease with glomerular filtration rate of 36 and COPD as well as alcohol abuse.  These are all been appropriately treated as best possible.  Recently the patient did have a hospitalization in February for acute on chronic diastolic dysfunction heart failure for which he was given appropriate medication management and sent home.  This time the patient does have a possibility of dietary or alcohol related issues with acute on chronic diastolic dysfunction heart failure.  Chest x-ray has shown bilateral atelectasis more on the left with some vascular congestion as well.  EKG as listed above shows atrial pacing.  There is no current evidence of myocardial infarction.  The patient has not had any fever or white count elevation and does produce some white sputum which does not appear to be consistent with infection at this time.  With  intravenous Lasix the patient has had some improvements of symptoms.  Currently he is hemodynamically stable. Discussion with the patient and all history was obtained through phone and chart review  Past Medical History:  Diagnosis Date  . Aortic stenosis   . Arrhythmia    atrial fibrillation  . Asthma   . Atrial fibrillation ([place])   . CAD (coronary artery disease)   . Cancer [facility])    colon, bladder and kidney  . CHF (congestive heart failure) ([place])   . 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 COPD (chronic obstructive pulmonary disease) ([place])   . H/O ventricular tachycardia   . Hypertension   . Nonischemic cardiomyopathy ([place])    Status post AICD and pacemaker  . Renal cell carcinoma [facility])       Surgical History:  Past Surgical History:  Procedure Laterality Date  . CARDIAC DEFIBRILLATOR PLACEMENT    . CARDIAC DEFIBRILLATOR PLACEMENT    . COLONOSCOPY WITH PROPOFOL N/A 06/10/2018   Procedure: COLONOSCOPY WITH PROPOFOL;  Surgeon: Lin Landsman, MD;  Location: [facility] ENDOSCOPY;  Service: Gastroenterology;  Laterality: N/A;  . CORONARY ANGIOPLASTY  09/25/2016  . IRRIGATION AND DEBRIDEMENT HEMATOMA Left 10/24/2016   Procedure: IRRIGATION AND DEBRIDEMENT HEMATOMA;  Surgeon: Florene Glen, MD;  Location: [facility] ORS;  Service: General;  Laterality: Left;  . KIDNEY SURGERY     left ne[hrectomy for RCC  . PACEMAKER IMPLANT    . PARTIAL COLECTOMY       Home Meds: Prior to Admission medications   Medication Sig Start Date End Date Taking? Authorizing Provider  acetaminophen (TYLENOL) 325 MG tablet Take 650 mg by mouth every 6 (six) hours as needed for mild pain  or fever.    Yes [provider]  acetylcysteine (MUCOMYST) 20 % nebulizer solution 27ml nebulizer twice a day  Dx:j44.1 11/12/18  Yes Wieting, Richard, MD  allopurinol (ZYLOPRIM) 100 MG tablet Take 100 mg by mouth every morning. 12/22/18  Yes [provider]  amiodarone (PACERONE) 200 MG tablet Take 200 mg by mouth  daily.   Yes [provider]  amLODipine (NORVASC) 10 MG tablet Take 10 mg by mouth daily.   Yes [provider]  apixaban (ELIQUIS) 5 MG TABS tablet Take 5 mg by mouth 2 (two) times daily.   Yes [provider]  aspirin 81 MG chewable tablet Chew 81 mg by mouth daily.   Yes [provider]  atorvastatin (LIPITOR) 80 MG tablet Take 80 mg by mouth daily. 12/02/18  Yes [provider]  brimonidine (ALPHAGAN) 0.15 % ophthalmic solution Place 1 drop into both eyes 3 (three) times daily. 12/20/17  Yes [provider]  cholecalciferol (VITAMIN D) 1000 units tablet Take 1,000 Units by mouth daily.   Yes [provider]  furosemide (LASIX) 40 MG tablet Take 40 mg by mouth See admin instructions. Take 1 tablet (40mg ) by mouth twice daily and 1 additional tablet (40mg ) by mouth if needed for excessive swelling or edema   Yes [provider]  metoprolol (TOPROL-XL) 200 MG 24 hr tablet Take 200 mg by mouth daily.   Yes [provider]  spironolactone (ALDACTONE) 25 MG tablet Take 12.5 mg by mouth daily.   Yes [provider]  tiotropium (SPIRIVA HANDIHALER) 18 MCG inhalation capsule Place 1 capsule (18 mcg total) into inhaler and inhale daily for 30 days. 11/12/18 12/30/18 Yes Wieting, Richard, MD  budesonide-formoterol [facility]) 80-4.5 MCG/ACT inhaler Inhale 2 puffs into the lungs 2 (two) times daily. Patient not taking: Reported on 12/30/2018 11/12/18   [name], MD  colchicine 0.6 MG tablet Take 1 tablet (0.6 mg total) by mouth daily. Patient not taking: Reported on 12/30/2018 11/12/18 11/12/19  [name], MD  doxycycline (VIBRA-TABS) 100 MG tablet Take 1 tablet (100 mg total) by mouth every 12 (twelve) hours. Patient not taking: Reported on 12/30/2018 11/12/18   [name], MD  latanoprost (XALATAN) 0.005 % ophthalmic solution Place 1 drop into both eyes at bedtime. 12/20/17   [provider]   magnesium oxide (MAG-OX) 400 MG tablet Take 400 mg by mouth 2 (two) times daily.    [provider]  nitroGLYCERIN (NITROSTAT) 0.4 MG SL tablet Place 0.4 mg under the tongue every 5 (five) minutes as needed for chest pain.    [provider]    Inpatient Medications:  . amLODipine  10 mg Oral Daily  . apixaban  5 mg Oral BID  . aspirin  81 mg Oral Daily  . Chlorhexidine Gluconate Cloth  6 each Topical Q0600  . docusate sodium  100 mg Oral Daily  . feeding supplement (NEPRO CARB STEADY)  237 mL Oral Q24H  . folic acid  1 mg Oral Daily  . furosemide  40 mg Intravenous BID  . magnesium oxide  400 mg Oral BID  . mometasone-formoterol  2 puff Inhalation BID  . multivitamin with minerals  1 tablet Oral Daily  . mupirocin ointment   Nasal BID  . thiamine  100 mg Oral Daily   Or  . thiamine  100 mg Intravenous Daily  . tiotropium  18 mcg Inhalation Daily     Allergies: No Known Allergies  Social History  Socioeconomic History  . Marital status: Widowed    Spouse name: Not on file  . Number of children: Not on file  . Years of education: Not on file  . Highest education level: Not on file  Occupational History  . Not on file  Social Needs  . Financial resource strain: Not hard at all  . Food insecurity:    Worry: Not on file    Inability: Never true  . Transportation needs:    Medical: No    Non-medical: No  Tobacco Use  . Smoking status: Former Research scientist (life sciences)  . Smokeless tobacco: Never Used  Substance and Sexual Activity  . Alcohol use: No  . Drug use: No  . Sexual activity: Not on file  Lifestyle  . Physical activity:    Days per week: 3 days    Minutes per session: 10 min  . Stress: Not at all  Relationships  . Social connections:    Talks on phone: Patient refused    Gets together: Patient refused    Attends religious service: Patient refused    Active member of club or organization: Not on file    Attends meetings of clubs or organizations:  Patient refused    Relationship status: Patient refused  . Intimate partner violence:    Fear of current or ex partner: Patient refused    Emotionally abused: Patient refused    Physically abused: Patient refused    Forced sexual activity: Patient refused  Other Topics Concern  . Not on file  Social History Narrative   Lives at home by himself.  Independent at baseline     Family History  Problem Relation Age of Onset  . Hypertension Mother   . Stroke Father      Review of Systems Positive for shortness of breath with white sputum production Negative for: General:  chills, fever, night sweats or weight changes.  Cardiovascular: PND orthopnea syncope dizziness  Dermatological skin lesions rashes Respiratory: Cough congestion Urologic: Frequent urination urination at night and hematuria Abdominal: negative for nausea, vomiting, diarrhea, bright red blood per rectum, melena, or hematemesis Neurologic: negative for visual changes, and/or hearing changes  All other systems reviewed and are otherwise negative except as noted above.  Labs: No results for input(s): CKTOTAL, CKMB, TROPONINI in the last 72 hours. Lab Results  Component Value Date   WBC 9.6 01/02/2019   HGB 10.1 (L) 01/02/2019   HCT 32.2 (L) 01/02/2019   MCV 97.6 01/02/2019   PLT 158 01/02/2019    Recent Labs  Lab 12/29/18 2119  01/02/19 0828  NA  --    < > 134*  K  --    < > 5.3*  CL  --    < > 100  CO2  --    < > 25  BUN  --    < > 30*  CREATININE  --    < > 1.57*  CALCIUM  --    < > 8.9  PROT 8.3*  --   --   BILITOT 1.1  --   --   ALKPHOS 97  --   --   ALT 130*  --   --   AST 160*  --   --   GLUCOSE  --    < > 156*   < > = values in this interval not displayed.   Lab Results  Component Value Date   CHOL 121 07/25/2012   HDL 48 07/25/2012   LDLCALC 50  07/25/2012   TRIG 114 07/25/2012   No results found for: DDIMER  Radiology/Studies:  Dg Chest 1 View  Result Date: 12/31/2018 CLINICAL  DATA:  SOB. EXAM: CHEST  1 VIEW COMPARISON:  12/30/2018. FINDINGS: Marked cardiac enlargement. Dual lead pacer/AICD. Mild vascular congestion. No overt edema. Chronic elevation LEFT hemidiaphragm. No pneumothorax. Aortic atherosclerosis. IMPRESSION: Cardiomegaly. Mild vascular congestion without overt edema. Stable appearance from priors. Electronically Signed   By: Staci Righter M.D.   On: 12/31/2018 08:20   Ct Chest Wo Contrast  Result Date: 01/02/2019 CLINICAL DATA:  Dyspnea.  History of colon cancer. EXAM: CT CHEST WITHOUT CONTRAST TECHNIQUE: Multidetector CT imaging of the chest was performed following the standard protocol without IV contrast. COMPARISON:  Radiographs of December 31, 2018. CT scan of November 10, 2018. FINDINGS: Cardiovascular: Atherosclerosis of thoracic aorta is noted without aneurysm formation. Mild cardiomegaly is noted. Status post transcatheter aortic valve replacement. Coronary artery calcifications are noted. Mediastinum/Nodes: No enlarged mediastinal or axillary lymph nodes. Thyroid gland, trachea, and esophagus demonstrate no significant findings. Lungs/Pleura: No pneumothorax or pleural effusion is noted. Moderate left lower lobe subsegmental atelectasis or pneumonia is noted which is increased compared to prior exam. Mild right posterior basilar subsegmental atelectasis or inflammation is noted posteriorly in right lower lobe. New ground-glass opacity seen posteriorly in the right upper lobe concerning for pneumonia. Upper Abdomen: No acute abnormality. Musculoskeletal: No chest wall mass or suspicious bone lesions identified. IMPRESSION: New ground-glass opacity seen posteriorly in right upper lobe concerning for pneumonia; this may represent typical or atypical infection, including viral etiology. Also noted are increased bilateral lower lobe opacities concerning for worsening atelectasis or pneumonia, left worse than right. Coronary artery calcifications are noted. Aortic  Atherosclerosis (ICD10-I70.0). Electronically Signed   By: Marijo Conception, M.D.   On: 01/02/2019 16:18   Dg Chest Port 1 View  Result Date: 12/30/2018 CLINICAL DATA:  Progressive shortness of breath. EXAM: PORTABLE CHEST 1 VIEW COMPARISON:  Radiograph yesterday. CT 11/10/2018 FINDINGS: Similar cardiomegaly. Unchanged mediastinal contours. Aortic atherosclerosis with aortic root replacement. Left-sided pacemaker remains in place. Vascular congestion without overt pulmonary edema. Mild elevation of left hemidiaphragm. Improved atelectasis in the left mid lung. No large pleural effusion. No pneumothorax. Unchanged osseous structures. IMPRESSION: Cardiomegaly with vascular congestion. No overt pulmonary edema. Improved left midlung atelectasis. Aortic Atherosclerosis (ICD10-I70.0). Electronically Signed   By: Keith Rake M.D.   On: 12/30/2018 02:01   Dg Chest Portable 1 View  Result Date: 12/29/2018 CLINICAL DATA:  Weakness with apparent hypotension EXAM: PORTABLE CHEST 1 VIEW COMPARISON:  Chest radiograph November 05, 2018 and chest CT November 10, 2018 FINDINGS: There is atelectatic change in the left mid lung. There is no appreciable edema or consolidation. Heart is mildly enlarged with pulmonary vascularity normal. Pacemaker leads are attached the right atrium and right ventricle. There is aortic atherosclerosis. No adenopathy. No bone lesions. There is calcification in each carotid artery. IMPRESSION: Left midlung atelectasis. No edema or consolidation. Mild cardiomegaly. Pacemaker leads attached to right atrium and right ventricle. Aortic Atherosclerosis (ICD10-I70.0). Bilateral carotid artery calcification noted. Electronically Signed   By: Lowella Grip III M.D.   On: 12/29/2018 21:14    EKG: Atrial pacing with normal ventricular rate  Weights: Filed Weights   12/31/18 0646 01/01/19 0425 01/02/19 0549  Weight: 98.9 kg 99.2 kg 100.5 kg     Physical Exam: Blood pressure 128/84, pulse  80, temperature 99.4 F (37.4 C), temperature source Oral, resp. rate 20, height 5'  11" (1.803 m), weight 100.5 kg, SpO2 93 %. Body mass index is 30.91 kg/m. Physical exam as per prime doc previously listed       Assessment: 73 year old male with acute on chronic diastolic dysfunction congestive heart failure multifactorial in nature including left ventricular hypertrophy, history of TAVR, history of atrial fibrillation, hypertension, chronic kidney disease, dietary and other alcohol issues improving without evidence of myocardial infarction  Plan: 1.  Continuation of gentle diuresis with furosemide watching closely for worsening kidney disease for pulmonary edema and symptoms of acute on chronic diastolic dysfunction heart failure 2.  No further cardiac diagnostics necessary at this time due to recent echocardiogram showing normal LV systolic function with ejection fraction of 50% with left ventricular hypertrophy and normally functioning aortic valve prosthesis but significant pulmonary hypertension previously reviewed and seen in 2018 as well 3.  Further instruction for concerns of dietary indiscretions and alcohol dependence as a possible cause of exacerbation 4.  Continue anticoagulation for further risk reduction in stroke with atrial fibrillation 5.  Consider reintroduction of low-dose beta-blocker for acute on chronic diastolic dysfunction heart failure and maintenance of normal sinus rhythm 6.  High intensity cholesterol therapy for coronary artery disease and reinstatement as able 7.  Further consideration of some concerns of amiodarone use with COPD and treatment of atrial fibrillation.  Would consider further discussion with pulmonology at this time 8.  Begin ambulation and follow for improvements of symptoms and possible discharged home if improved dramatically with follow-up next week if able  Signed, Corey Skains M.D. [facility] 01/02/2019, 4:43 PM

## 2019-01-02 NOTE — Progress Notes (Signed)
Physical Therapy Treatment Patient Details Name: Darius Taylor MRN: [id number] DOB: 28-Apr-1946 Today's Date: 01/02/2019    History of Present Illness 73 y.o. male with a known history of atrial fibrillation on eliquis, CAD, nonischemic cardiomyopathy with last known EF of 25% status post AICD and pacemaker, CKD, history of colonic adenocarcinoma status post partial colectomy, history of renal cell carcinoma status post left nephrectomy, COPD on as needed home oxygen presented to the hospital secondary to worsening ED with a complaint of weakness and fatigue.  He was found here to have an elevated creatinine he has elevated troponin and BNP.    PT Comments    Pt received in bed on 4.5L O2, SpO2: 92%. Pt agreeable to participate. Desaturation to 85% AMB 30ft on 6L/min, but only down to 89% on 8L/min via [place] (respiratory/RN report pt should have '[place]' when >6L. Pt then performs 3 stairs with railing. Bed mobility and transfers at modI level, supervision for AMB to monitor SpO2, desaturation does not correlate with SOB this session. MinGuard assist with Stairs, which performs well. Pt maintains mobility progression from 1DA, but respiratory status is worse, requiring a higher flow rate.   Follow Up Recommendations  Home health PT     Equipment Recommendations  None recommended by PT    Recommendations for Other Services       Precautions / Restrictions Precautions Precautions: Fall Restrictions Weight Bearing Restrictions: No    Mobility  Bed Mobility Overal bed mobility: Independent                Transfers Overall transfer level: Modified independent Equipment used: Rolling walker (2 wheeled)             General transfer comment: Pt did well getting to standing w/o assist, minimal cuing to use UEs to control descent; 3x in session.   Ambulation/Gait Ambulation/Gait assistance: Supervision Gait Distance (Feet): 150 Feet Assistive device: Rolling walker (2  wheeled) Gait Pattern/deviations: WFL(Within Functional Limits) Gait velocity: 0.32m/s Gait velocity interpretation: <1.31 ft/sec, indicative of household ambulator General Gait Details: Stops Q59ft to check O2 sats. drops to 85% on 6L; 89% on 8L.    Stairs Stairs: Yes Stairs assistance: Min guard Stair Management: Two rails Number of Stairs: 3 General stair comments: Pt reports effort as close to baseline (performed on 8L)    Wheelchair Mobility    Modified Rankin (Stroke Patients Only)       Balance Overall balance assessment: Modified Independent;Mild deficits observed, not formally tested                                          Cognition Arousal/Alertness: Awake/alert Behavior During Therapy: WFL for tasks assessed/performed Overall Cognitive Status: Within Functional Limits for tasks assessed                                        Exercises      General Comments        Pertinent Vitals/Pain Pain Assessment: No/denies pain    Home Living                      Prior Function            PT Goals (current goals can now be found in the care  plan section) Acute Rehab PT Goals Patient Stated Goal: go home PT Goal Formulation: With patient/family Time For Goal Achievement: 01/13/19 Potential to Achieve Goals: Good Progress towards PT goals: Progressing toward goals    Frequency    Min 2X/week      PT Plan Current plan remains appropriate    Co-evaluation              AM-PAC PT "6 Clicks" Mobility   Outcome Measure  Help needed turning from your back to your side while in a flat bed without using bedrails?: None Help needed moving from lying on your back to sitting on the side of a flat bed without using bedrails?: None Help needed moving to and from a bed to a chair (including a wheelchair)?: None Help needed standing up from a chair using your arms (e.g., wheelchair or bedside chair)?: None Help  needed to walk in hospital room?: A Little Help needed climbing 3-5 steps with a railing? : A Little 6 Click Score: 22    End of Session Equipment Utilized During Treatment: Gait belt;Oxygen Activity Tolerance: Patient limited by fatigue;Patient tolerated treatment well Patient left: with call bell/phone within reach;with chair alarm set Nurse Communication: Mobility status PT Visit Diagnosis: Muscle weakness (generalized) (M62.81);Difficulty in walking, not elsewhere classified (R26.2)     Time: 4709-6283 PT Time Calculation (min) (ACUTE ONLY): 23 min  Charges:  $Therapeutic Exercise: 23-37 mins                     12:43 PM, 01/02/19 [name], PT, DPT Physical Therapist - [facility]  [phone] ([place])     Buccola,Allan C 01/02/2019, 12:40 PM

## 2019-01-02 NOTE — Progress Notes (Signed)
0840 informed Dr.Gouru pt required increase in 02 over the night. Pt currently on 4 L [place]. 1338 message Dr. Margaretmary Eddy, pt was 89-90% on 5L [place], RT to bedside and  increase O2 to 6 L  HFNC. Pt needs a sat goal and do you want to continue CWIA, Ativan medications for CWIA is expired< CWIA score 0?? Waiting response.

## 2019-01-02 NOTE — Consult Note (Signed)
Pulmonary Medicine          Date: 01/02/2019,   MRN# [id number] Darius Taylor [birth year]     AdmissionWeight: 99.8 kg                 CurrentWeight: 100.5 kg      CHIEF COMPLAINT:   Increased O2 requirement, dyspnea at rest, malaise X3d   HISTORY OF PRESENT ILLNESS    This is a 73 yo male with hx of CAD, CHF, COPD, colon adenoca, AF, arythmias, s/p ppmAICD, s/p nephrectomy in 2000, s/p colon resection 2016. Patient states he has been taking his usual bid lasix 40mg  but started to feel week for few days prior to admission.  He usually uses 2L/min [place] with exertion but no O2 at rest, he noted that even with 2Lnc he had O2 desaturation in mid 80s and felt dyspneic at rest. He denies feeling flu like illness, has had chronic cough related to COPD productive of whittish sputum for many years without change in color or volume, he relates significant fatigue, he denies weight los, denies diaphoresis, myalgias, falls, chest pain.     PAST MEDICAL HISTORY   Past Medical History:  Diagnosis Date  . Aortic stenosis   . Arrhythmia    atrial fibrillation  . Asthma   . Atrial fibrillation ([place])   . CAD (coronary artery disease)   . Cancer [facility])    colon, bladder and kidney  . CHF (congestive heart failure) ([place])   . 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 COPD (chronic obstructive pulmonary disease) ([place])   . H/O ventricular tachycardia   . Hypertension   . Nonischemic cardiomyopathy ([place])    Status post AICD and pacemaker  . Renal cell carcinoma ([place])      SURGICAL HISTORY   Past Surgical History:  Procedure Laterality Date  . CARDIAC DEFIBRILLATOR PLACEMENT    . CARDIAC DEFIBRILLATOR PLACEMENT    . COLONOSCOPY WITH PROPOFOL N/A 06/10/2018   Procedure: COLONOSCOPY WITH PROPOFOL;  Surgeon: Lin Landsman, MD;  Location: [facility] ENDOSCOPY;  Service: Gastroenterology;  Laterality: N/A;  . CORONARY ANGIOPLASTY  09/25/2016  . IRRIGATION AND DEBRIDEMENT HEMATOMA Left  10/24/2016   Procedure: IRRIGATION AND DEBRIDEMENT HEMATOMA;  Surgeon: Florene Glen, MD;  Location: [facility] ORS;  Service: General;  Laterality: Left;  . KIDNEY SURGERY     left ne[hrectomy for RCC  . PACEMAKER IMPLANT    . PARTIAL COLECTOMY       FAMILY HISTORY   Family History  Problem Relation Age of Onset  . Hypertension Mother   . Stroke Father      SOCIAL HISTORY   Social History   Tobacco Use  . Smoking status: Former Research scientist (life sciences)  . Smokeless tobacco: Never Used  Substance Use Topics  . Alcohol use: No  . Drug use: No     MEDICATIONS    Home Medication:    Current Medication:  Current Facility-Administered Medications:  .  acetaminophen (TYLENOL) tablet 650 mg, 650 mg, Oral, Q6H PRN, 650 mg at 01/02/19 0618 **OR** acetaminophen (TYLENOL) suppository 650 mg, 650 mg, Rectal, Q6H PRN, Lance Coon, MD .  albuterol (PROVENTIL) (2.5 MG/3ML) 0.083% nebulizer solution 2.5 mg, 2.5 mg, Nebulization, Q4H PRN, Gouru, Aruna, MD, 2.5 mg at 01/02/19 0641 .  amLODipine (NORVASC) tablet 10 mg, 10 mg, Oral, Daily, Lance Coon, MD, 10 mg at 01/02/19 1009 .  apixaban (ELIQUIS) tablet 5 mg, 5 mg, Oral, BID, Lance Coon, MD, 5 mg  at 01/02/19 1009 .  aspirin chewable tablet 81 mg, 81 mg, Oral, Daily, Lance Coon, MD, 81 mg at 01/02/19 1009 .  Chlorhexidine Gluconate Cloth 2 % PADS 6 each, 6 each, Topical, Q0600, Stark Jock, Jude, MD, 6 each at 01/02/19 0659 .  docusate sodium (COLACE) capsule 100 mg, 100 mg, Oral, Daily, Fritzi Mandes, MD, 100 mg at 01/02/19 1009 .  feeding supplement (NEPRO CARB STEADY) liquid 237 mL, 237 mL, Oral, Q24H, Gouru, Aruna, MD, 237 mL at 01/02/19 1316 .  folic acid (FOLVITE) tablet 1 mg, 1 mg, Oral, Daily, Lance Coon, MD, 1 mg at 01/02/19 1009 .  furosemide (LASIX) injection 20 mg, 20 mg, Intravenous, BID, Gouru, Aruna, MD .  guaiFENesin-dextromethorphan (ROBITUSSIN DM) 100-10 MG/5ML syrup 5 mL, 5 mL, Oral, Q4H PRN, Gouru, Aruna, MD, 5 mL at 01/02/19 0618  .  magnesium oxide (MAG-OX) tablet 400 mg, 400 mg, Oral, BID, Lance Coon, MD, 400 mg at 01/02/19 1009 .  mometasone-formoterol (DULERA) 100-5 MCG/ACT inhaler 2 puff, 2 puff, Inhalation, BID, Lance Coon, MD, 2 puff at 01/02/19 1012 .  multivitamin with minerals tablet 1 tablet, 1 tablet, Oral, Daily, Lance Coon, MD, 1 tablet at 01/02/19 1009 .  mupirocin ointment (BACTROBAN) 2 %, , Nasal, BID, [place], Jude, MD .  ondansetron (ZOFRAN) tablet 4 mg, 4 mg, Oral, Q6H PRN, 4 mg at 12/31/18 2200 **OR** ondansetron (ZOFRAN) injection 4 mg, 4 mg, Intravenous, Q6H PRN, Lance Coon, MD .  sodium zirconium cyclosilicate (LOKELMA) packet 5 g, 5 g, Oral, Once, Gouru, Aruna, MD .  thiamine (VITAMIN B-1) tablet 100 mg, 100 mg, Oral, Daily, 100 mg at 01/02/19 1009 **OR** thiamine (B-1) injection 100 mg, 100 mg, Intravenous, Daily, Lance Coon, MD .  tiotropium [facility]) inhalation capsule ([facility] use ONLY) 18 mcg, 18 mcg, Inhalation, Daily, Lance Coon, MD, 18 mcg at 01/02/19 1012    ALLERGIES   Patient has no known allergies.     REVIEW OF SYSTEMS    Review of Systems:  Gen:  Denies  fever, sweats, chills weigh loss  HEENT: Denies blurred vision, double vision, ear pain, eye pain, hearing loss, nose bleeds, sore throat Cardiac:  No dizziness, chest pain or heaviness, chest tightness,edema Resp:   Denies cough or sputum porduction, shortness of breath,wheezing, hemoptysis,  Gi: Denies swallowing difficulty, stomach pain, nausea or vomiting, diarrhea, constipation, bowel incontinence Gu:  Denies bladder incontinence, burning urine Ext:   Denies Joint pain, stiffness or swelling Skin: Denies  skin rash, easy bruising or bleeding or hives Endoc:  Denies polyuria, polydipsia , polyphagia or weight change Psych:   Denies depression, insomnia or hallucinations   Other:  All other systems negative   VS: BP 121/71 (BP Location: Right Leg)   Pulse 85   Temp 98.3 F (36.8 C) (Oral)   Resp 18    Ht 5\' 11"  (1.803 m)   Wt 100.5 kg   SpO2 93%   BMI 30.91 kg/m      PHYSICAL EXAM    GENERAL:NAD, no fevers, chills, no weakness no fatigue HEAD: Normocephalic, atraumatic.  EYES: Pupils equal, round, reactive to light. Extraocular muscles intact. No scleral icterus.  MOUTH: Moist mucosal membrane. Dentition intact. No abscess noted.  EAR, NOSE, THROAT: Clear without exudates. No external lesions.  NECK: Supple. No thyromegaly. No nodules. No JVD.  PULMONARY: Decreased bs bilaterally , bibasilar mild cracklles without wheezing.  CARDIOVASCULAR: S1 and S2. Regular rate and rhythm. No murmurs, rubs, or gallops. No edema. Pedal pulses 2+ bilaterally.  GASTROINTESTINAL: Soft, nontender, nondistended. No masses. Positive bowel sounds. No hepatosplenomegaly.  MUSCULOSKELETAL: No swelling, clubbing, or edema. Range of motion full in all extremities.  NEUROLOGIC: Cranial nerves II through XII are intact. No gross focal neurological deficits. Sensation intact. Reflexes intact.  SKIN: No ulceration, lesions, rashes, or cyanosis. Skin warm and dry. Turgor intact.  PSYCHIATRIC: Mood, affect within normal limits. The patient is awake, alert and oriented x 3. Insight, judgment intact.       IMAGING    Dg Chest 1 View  Result Date: 12/31/2018 CLINICAL DATA:  SOB. EXAM: CHEST  1 VIEW COMPARISON:  12/30/2018. FINDINGS: Marked cardiac enlargement. Dual lead pacer/AICD. Mild vascular congestion. No overt edema. Chronic elevation LEFT hemidiaphragm. No pneumothorax. Aortic atherosclerosis. IMPRESSION: Cardiomegaly. Mild vascular congestion without overt edema. Stable appearance from priors. Electronically Signed   By: Staci Righter M.D.   On: 12/31/2018 08:20   Dg Chest Port 1 View  Result Date: 12/30/2018 CLINICAL DATA:  Progressive shortness of breath. EXAM: PORTABLE CHEST 1 VIEW COMPARISON:  Radiograph yesterday. CT 11/10/2018 FINDINGS: Similar cardiomegaly. Unchanged mediastinal contours. Aortic  atherosclerosis with aortic root replacement. Left-sided pacemaker remains in place. Vascular congestion without overt pulmonary edema. Mild elevation of left hemidiaphragm. Improved atelectasis in the left mid lung. No large pleural effusion. No pneumothorax. Unchanged osseous structures. IMPRESSION: Cardiomegaly with vascular congestion. No overt pulmonary edema. Improved left midlung atelectasis. Aortic Atherosclerosis (ICD10-I70.0). Electronically Signed   By: Keith Rake M.D.   On: 12/30/2018 02:01   Dg Chest Portable 1 View  Result Date: 12/29/2018 CLINICAL DATA:  Weakness with apparent hypotension EXAM: PORTABLE CHEST 1 VIEW COMPARISON:  Chest radiograph November 05, 2018 and chest CT November 10, 2018 FINDINGS: There is atelectatic change in the left mid lung. There is no appreciable edema or consolidation. Heart is mildly enlarged with pulmonary vascularity normal. Pacemaker leads are attached the right atrium and right ventricle. There is aortic atherosclerosis. No adenopathy. No bone lesions. There is calcification in each carotid artery. IMPRESSION: Left midlung atelectasis. No edema or consolidation. Mild cardiomegaly. Pacemaker leads attached to right atrium and right ventricle. Aortic Atherosclerosis (ICD10-I70.0). Bilateral carotid artery calcification noted. Electronically Signed   By: Lowella Grip III M.D.   On: 12/29/2018 21:14      1. The left ventricle has low normal systolic function, with an ejection fraction of 50-55%. The cavity size was normal. There is moderately increased left ventricular wall thickness.Unable to exclude regional wall motion abnormality. Left ventricular  diastolic Doppler parameters are indeterminate.  2. The right ventricle has normal systolic function. The cavity was normal. There is no increase in right ventricular wall thickness.  3. Left atrial size was severely dilated.  4. Right atrial size was moderately dilated.  5. Tricuspid valve  regurgitation is moderate.  6. The aortic valve has an indeterminant number of cusps There is severe calcifcation of the aortic valve. Aortic valve regurgitation is mild by color flow Doppler. There is mild stenosis of the aortic valve.  7. Severely elevated right ventricular systolic pressure  FINDINGS  Left Ventricle: The left ventricle has low normal systolic function, with an ejection fraction of 50-55%. The cavity size was normal. There is moderately increased left ventricular wall thickness. Left ventricular diastolic Doppler parameters are  indeterminate Right Ventricle: The right ventricle has normal systolic function. The cavity was normal. There is no increase in right ventricular wall thickness. Left Atrium: left atrial size was severely dilated Right Atrium: right atrial size was moderately  dilated Interatrial Septum: No atrial level shunt detected by color flow Doppler. Pericardium: There is no evidence of pericardial effusion. Mitral Valve: The mitral valve is normal in structure. Mitral valve regurgitation is mild by color flow Doppler. Tricuspid Valve: The tricuspid valve is normal in structure. Tricuspid valve regurgitation is moderate by color flow Doppler. Aortic Valve: The aortic valve was not well visualized. The aortic valve has an indeterminant number of cusps Aortic valve regurgitation is mild by color flow Doppler. There is mild stenosis of the aortic valve. Pulmonic Valve: The pulmonic valve was normal in structure. Pulmonic valve regurgitation is not visualized by color flow Doppler. Venous: The inferior vena cava is normal in size with greater than 50% respiratory variability.   LEFT VENTRICLE PLAX 2D (Teich) LV EF:          63.7 %  Diastology LVIDd:          4.79 cm LV e' lateral:   6.24 cm/s LVIDs:          3.13 cm LV E/e' lateral: 25.4 LV PW:          1.55 cm LV e' medial:    6.56 cm/s LV IVS:         1.64 cm LV E/e' medial:  24.2 LV SV:          68 ml   RIGHT VENTRICLE RV Basal diam:  4.66 cm RV S prime:     12.40 cm/s TAPSE (M-mode): 1.5 cm  LEFT ATRIUM              Index       RIGHT ATRIUM           Index LA diam:        6.60 cm  3.05 cm/m  RA Area:     22.90 cm LA Vol (A2C):   116.0 ml 53.67 ml/m RA Volume:   75.40 ml  34.88 ml/m LA Vol (A4C):   113.0 ml 52.28 ml/m LA Biplane Vol: 115.0 ml 53.21 ml/m  AORTIC VALVE AV Vmax:           229.75 cm/s AV Vmean:          161.750 cm/s AV VTI:            0.494 m AV Peak Grad:      21.1 mmHg AV Mean Grad:      12.0 mmHg LVOT Vmax:         85.75 cm/s LVOT Vmean:        56.050 cm/s LVOT VTI:          0.169 m LVOT/AV VTI ratio: 0.34 AR PHT:            347 msec   AORTA Ao Root diam: 3.80 cm  MITRAL VALVE               TR Peak grad: 65.4 mmHg MV Area (PHT):             TR Vmax:      413.00 cm/s MV PHT: MV Decel Time: 255 msec MV E velocity: 158.67 cm/s    Ida Rogue MD Electronically signed by Ida Rogue MD Signature Date/Time: 11/05/2018/5:50:38 PM            ASSESSMENT/PLAN    Acute hypoxemic respiratory failure     -likely due to acute decompensated systolic CHF and Atelectasis     -Currently on nasal cannula 6L/min from baseline 2l/min.     -  BNP mildly elevated, CXR improved post diuresis - pt has 1 kidney    - unlikely pneumonia - no leukocytosis, no fevers, no infiltrate on cxr    - denies increased phelgm volume or color which point away from acute copd exacerbation    - would continue diuresis, GFR close to baseline, will increase Lasix to 40 bid    - Aggressive bronchopulmonary hygiene to recruit atelectatic segments - chest physiotherapy, flutter valve, IS    - will need outpatient workup for pulmonary hypertension via RHC - RVSP was significantly elevated on TTE will discuss with cardiology - appreciate input     -will schedule for pulmonary clinic follow up at [facility] pul[facility]ology     Thank you for allowing me to participate in the care of  this patient.   Patient/Family are satisfied with care plan and all questions have been answered.  This document was prepared using Dragon voice recognition software and may include unintentional dictation errors.     Ottie Glazier, M.D.  Division of [place]

## 2019-01-02 NOTE — Progress Notes (Signed)
Initial Nutrition Assessment  DOCUMENTATION CODES:   Not applicable  INTERVENTION:   Nepro Shake po daily, each supplement provides 425 kcal and 19 grams protein  MVI, thiamine and folic acid daily   NUTRITION DIAGNOSIS:   Increased nutrient needs related to chronic illness(COPD, CHF, etoh abuse ) as evidenced by increased estimated needs.  GOAL:   Patient will meet greater than or equal to 90% of their needs  MONITOR:   PO intake, Supplement acceptance, Labs, Weight trends, I & O's, Skin  REASON FOR ASSESSMENT:   Consult Assessment of nutrition requirement/status  ASSESSMENT:   73 y/o male with h/o COPD, CKD III, etoh abuse admitted with CHF  RD working remotely.  Per chart review, pt with good appetite and oral intake; pt eating 100% of meals. Per chart review, pt with wt gain secondary to edema but appears fairly weight stable pta. RD will add supplements and MVI to help pt meet his estimated needs.   Medications reviewed and include: aspirin, colace, folic acid, Mg oxide, MVI, thiamine  Labs reviewed: Na 134(L), K 5.3(H), BUN 30(H), creat 1.57(H), Mg 2.5(H) Hgb 10.1(L), Hct 32.2(L)  Unable to complete Nutrition-Focused physical exam at this time.   Diet Order:   Diet Order            Diet Heart Room service appropriate? Yes; Fluid consistency: Thin  Diet effective now             EDUCATION NEEDS:   Not appropriate for education at this time  Skin:  Skin Assessment: Reviewed RN Assessment(ecchymosis )  Last BM:  4/7- Constipation   Height:   Ht Readings from Last 1 Encounters:  12/30/18 5\' 11"  (1.803 m)    Weight:   Wt Readings from Last 1 Encounters:  01/02/19 100.5 kg    Ideal Body Weight:  78 kg  BMI:  Body mass index is 30.91 kg/m.  Estimated Nutritional Needs:   Kcal:  2000-2300kcal/day   Protein:  100-110g/day   Fluid:  1522ml/day or per MD  Koleen Distance MS, RD, LDN Pager #- [phone] Office#- [phone] After  Hours Pager: [phone]

## 2019-01-02 NOTE — Progress Notes (Signed)
Pt saturations 82%, oxygen at 5L. Breath sounds clear but diminished. Patient had good urine output throughout shift. RT Christie called, PRN breathing treatment given. Post treatment patient around 88%. Will continue to monitor.

## 2019-01-02 NOTE — Progress Notes (Signed)
[name]    MR#:  [id number]  DATE OF BIRTH:  [birth year]  SUBJECTIVE:  CHIEF COMPLAINT:   Chief Complaint  Patient presents with  . Weakness    Patient is not feeling better requiring 6 L of oxygen today reporting exertional dyspnea patient reports using 2 L of oxygen as needed at home.  No fevers.  Reports decreased appetite  REVIEW OF SYSTEMS:  Review of Systems  Constitutional: Negative for chills and fever.  HENT: Negative for hearing loss and tinnitus.   Eyes: Negative for blurred vision and double vision.  Respiratory: Negative for cough and shortness of breath.   Cardiovascular: Negative for chest pain and palpitations.  Gastrointestinal: Negative for heartburn and nausea.  Genitourinary: Negative for dysuria and urgency.  Musculoskeletal: Negative for neck pain.  Skin: Negative for itching and rash.  Neurological: Negative for dizziness.  Psychiatric/Behavioral: Negative for depression and hallucinations.    DRUG ALLERGIES:  No Known Allergies VITALS:  Blood pressure 121/71, pulse 85, temperature 98.3 F (36.8 C), temperature source Oral, resp. rate 18, height 5\' 11"  (1.803 m), weight 100.5 kg, SpO2 93 %. PHYSICAL EXAMINATION:  Physical Exam  Physical Exam  Constitutional: He is oriented to person, place, and time. He appears well-developed.  HENT:  Head: Normocephalic and atraumatic.  Right Ear: External ear normal.  Eyes: Pupils are equal, round, and reactive to light. Conjunctivae are normal. Right eye exhibits no discharge.  Neck: Normal range of motion. Neck supple. No tracheal deviation present.  Cardiovascular:  Irregularly irregular  Respiratory: Effort normal and breath sounds normal. He has no rales.  GI: Soft. Bowel sounds are normal.  Musculoskeletal: Normal range of motion.        General: No edema.  Neurological: He is alert and oriented to person, place, and time.   Skin: Skin is warm. He is not diaphoretic. No erythema.  Psychiatric: He has a normal mood and affect. His behavior is normal.   LABORATORY PANEL:  Male CBC Recent Labs  Lab 01/02/19 0828  WBC 9.6  HGB 10.1*  HCT 32.2*  PLT 158   ------------------------------------------------------------------------------------------------------------------ Chemistries  Recent Labs  Lab 12/29/18 2119  01/01/19 0550 01/02/19 0828  NA  --    < > 138 134*  K  --    < > 4.4 5.3*  CL  --    < > 101 100  CO2  --    < > 28 25  GLUCOSE  --    < > 104* 156*  BUN  --    < > 35* 30*  CREATININE  --    < > 1.58* 1.57*  CALCIUM  --    < > 9.0 8.9  MG  --    < > 2.5*  --   AST 160*  --   --   --   ALT 130*  --   --   --   ALKPHOS 97  --   --   --   BILITOT 1.1  --   --   --    < > = values in this interval not displayed.   RADIOLOGY:  No results found. ASSESSMENT AND PLAN:   1.  Acute kidney injury superimposed on chronic kidney disease stage III Likely due to recent alcohol use leading to dehydration.  Renal function improved slightly with initial IV fluid hydration.  IV fluid had to be discontinued in the  emergency room due to patient being short of breath.  Chest x-ray done showed mild pulmonary vascular congestion.  Was given a dose of IV Lasix in the emergency room.   Renal function gradually improving.  Avoid nephrotoxic agents   2.  Acute on chronic hypoxic respiratory failure Patient uses 2 L of home oxygen therapy at home.  Currently on 6 L of oxygen  CT chest ordered pulmonology consult IV Lasix Monitor intake and output  3.  acute on chronic diastolic CHF Chest x-ray with mild vascular congestion without overt edema We will start the patient on 20 mg of IV Lasix as renal function is improving Wean down oxygen to baseline 2 L Echocardiogram 220 has revealed 50 to 55% ejection fraction with normal systolic function Consult cardiology unassigned-[facility]   4.  CAD Stable.  5.  COPD  Stable No continue nebs  6.  Paroxysmal atrial fibrillation Rate controlled Patient on anticoagulation with Eliquis  7. Alcohol intoxication Patient clinically stable this morning.  Alcohol level was 122.  Patient denies drinking alcohol on a daily basis. Monitor for any signs of alcohol withdrawal.  Continue thiamine, multivitamin and folic acid   8 generalized weakness Physical therapist evaluated patient and recommended home health with physical therapy.  9.  Decreased appetite dietitian consult  DVT prophylaxis; patient on Eliquis  Disposition; plans to discharge home tomorrow if respiratory status improves and remains stable  All the records are reviewed and case discussed with Care Management/Social Worker. Management plans discussed with the patient, he is in agreement.  CODE STATUS: Full Code  TOTAL TIME TAKING CARE OF THIS PATIENT: 35 minutes.   More than 50% of the time was spent in counseling/coordination of care: YES  POSSIBLE D/C IN 1DAY, DEPENDING ON CLINICAL CONDITION.   Nicholes Mango M.D on 01/02/2019 at 2:18 PM  Between 7am to 6pm - Pager - [phone]  After 6pm go to [email or web address] - Technical brewer  Hospitalists  Office  [phone]  CC: Primary care physician; [employer]  Note: This dictation was prepared with Diplomatic Services operational officer dictation along with smaller Company secretary. Any transcriptional errors that result from this process are unintentional.

## 2019-01-03 LAB — BASIC METABOLIC PANEL
Anion gap: 9 (ref 5–15)
BUN: 38 mg/dL — ABNORMAL HIGH (ref 8–23)
CO2: 29 mmol/L (ref 22–32)
Calcium: 8.7 mg/dL — ABNORMAL LOW (ref 8.9–10.3)
Chloride: 98 mmol/L (ref 98–111)
Creatinine, Ser: 1.58 mg/dL — ABNORMAL HIGH (ref 0.61–1.24)
GFR calc Af Amer: 49 mL/min — ABNORMAL LOW (ref >60)
GFR calc non Af Amer: 42 mL/min — ABNORMAL LOW (ref >60)
Glucose, Bld: 116 mg/dL — ABNORMAL HIGH (ref 70–99)
Potassium: 4.6 mmol/L (ref 3.5–5.1)
Sodium: 136 mmol/L (ref 135–145)

## 2019-01-03 LAB — BLOOD GAS, ARTERIAL
Acid-Base Excess: 6.4 mmol/L — ABNORMAL HIGH (ref 0.0–2.0)
Bicarbonate: 31.3 mmol/L — ABNORMAL HIGH (ref 20.0–28.0)
FIO2: 0.8
O2 Saturation: 95.5 %
pCO2 arterial: 45 mmHg (ref 32.0–48.0)
pH, Arterial: 7.45 (ref 7.350–7.450)
pO2, Arterial: 75 mmHg — ABNORMAL LOW (ref 83.0–108.0)

## 2019-01-03 LAB — PROCALCITONIN: Procalcitonin: 16.48 ng/mL

## 2019-01-03 LAB — RESPIRATORY PANEL BY PCR

## 2019-01-03 LAB — GLUCOSE, CAPILLARY: Glucose-Capillary: 197 mg/dL — ABNORMAL HIGH (ref 70–99)

## 2019-01-03 LAB — MRSA PCR SCREENING: MRSA by PCR: NEGATIVE

## 2019-01-03 LAB — INFLUENZA PANEL BY PCR (TYPE A & B)
Influenza A By PCR: NEGATIVE
Influenza B By PCR: NEGATIVE

## 2019-01-03 MED ORDER — SODIUM CHLORIDE 0.9 % IV SOLN
2.0000 g | Freq: Two times a day (BID) | INTRAVENOUS | Status: DC
  Administered 2019-01-03 – 2019-01-08 (×12): 2 g via INTRAVENOUS
  Filled 2019-01-03 (×16): qty 2

## 2019-01-03 MED ORDER — FUROSEMIDE 10 MG/ML IJ SOLN
40.0000 mg | Freq: Once | INTRAMUSCULAR | Status: AC
  Administered 2019-01-03: 17:00:00 40 mg via INTRAVENOUS
  Filled 2019-01-03: qty 4

## 2019-01-03 MED ORDER — ALBUTEROL SULFATE HFA 108 (90 BASE) MCG/ACT IN AERS
1.0000 | INHALATION_SPRAY | RESPIRATORY_TRACT | Status: DC | PRN
  Filled 2019-01-03 (×2): qty 6.7

## 2019-01-03 MED ORDER — VANCOMYCIN HCL 10 G IV SOLR
2000.0000 mg | Freq: Once | INTRAVENOUS | Status: AC
  Administered 2019-01-03: 2000 mg via INTRAVENOUS
  Filled 2019-01-03: qty 2000

## 2019-01-03 MED ORDER — VANCOMYCIN HCL 10 G IV SOLR
1250.0000 mg | INTRAVENOUS | Status: DC
  Administered 2019-01-04 – 2019-01-05 (×2): 1250 mg via INTRAVENOUS
  Filled 2019-01-03 (×3): qty 1250

## 2019-01-03 MED ORDER — IPRATROPIUM-ALBUTEROL 20-100 MCG/ACT IN AERS
1.0000 | INHALATION_SPRAY | Freq: Four times a day (QID) | RESPIRATORY_TRACT | Status: DC
  Administered 2019-01-03 – 2019-01-05 (×5): 1 via RESPIRATORY_TRACT
  Filled 2019-01-03 (×2): qty 4

## 2019-01-03 NOTE — Progress Notes (Signed)
Pulmonary Medicine          Date: 01/03/2019,   MRN# [id number] Darius Taylor 05-07-1946     AdmissionWeight: 99.8 kg                 CurrentWeight: 99.8 kg        SUBJECTIVE   Patient states he feels better, no    PAST MEDICAL HISTORY   Past Medical History:  Diagnosis Date  . Aortic stenosis   . Arrhythmia    atrial fibrillation  . Asthma   . Atrial fibrillation ([place])   . CAD (coronary artery disease)   . Cancer [facility])    colon, bladder and kidney  . CHF (congestive heart failure) ([place])   . 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 COPD (chronic obstructive pulmonary disease) ([place])   . H/O ventricular tachycardia   . Hypertension   . Nonischemic cardiomyopathy ([place])    Status post AICD and pacemaker  . Renal cell carcinoma ([place])      SURGICAL HISTORY   Past Surgical History:  Procedure Laterality Date  . CARDIAC DEFIBRILLATOR PLACEMENT    . CARDIAC DEFIBRILLATOR PLACEMENT    . COLONOSCOPY WITH PROPOFOL N/A 06/10/2018   Procedure: COLONOSCOPY WITH PROPOFOL;  Surgeon: Lin Landsman, MD;  Location: [facility] ENDOSCOPY;  Service: Gastroenterology;  Laterality: N/A;  . CORONARY ANGIOPLASTY  09/25/2016  . IRRIGATION AND DEBRIDEMENT HEMATOMA Left 10/24/2016   Procedure: IRRIGATION AND DEBRIDEMENT HEMATOMA;  Surgeon: Florene Glen, MD;  Location: [facility] ORS;  Service: General;  Laterality: Left;  . KIDNEY SURGERY     left ne[hrectomy for RCC  . PACEMAKER IMPLANT    . PARTIAL COLECTOMY       FAMILY HISTORY   Family History  Problem Relation Age of Onset  . Hypertension Mother   . Stroke Father      SOCIAL HISTORY   Social History   Tobacco Use  . Smoking status: Former Research scientist (life sciences)  . Smokeless tobacco: Never Used  Substance Use Topics  . Alcohol use: No  . Drug use: No     MEDICATIONS    Home Medication:    Current Medication:  Current Facility-Administered Medications:  .  acetaminophen (TYLENOL) tablet 650 mg, 650 mg, Oral, Q6H PRN,  650 mg at 01/02/19 2335 **OR** acetaminophen (TYLENOL) suppository 650 mg, 650 mg, Rectal, Q6H PRN, Lance Coon, MD .  albuterol (PROVENTIL HFA;VENTOLIN HFA) 108 (90 Base) MCG/ACT inhaler 1 puff, 1 puff, Inhalation, Q4H PRN, Dallie Piles, RPH .  amLODipine (NORVASC) tablet 10 mg, 10 mg, Oral, Daily, Lance Coon, MD, 10 mg at 01/03/19 0930 .  apixaban (ELIQUIS) tablet 5 mg, 5 mg, Oral, BID, Lance Coon, MD, 5 mg at 01/03/19 0929 .  aspirin chewable tablet 81 mg, 81 mg, Oral, Daily, Lance Coon, MD, 81 mg at 01/03/19 0930 .  ceFEPIme (MAXIPIME) 2 g in sodium chloride 0.9 % 100 mL IVPB, 2 g, Intravenous, Q12H, Dallie Piles, RPH, Stopped at 01/03/19 1209 .  Chlorhexidine Gluconate Cloth 2 % PADS 6 each, 6 each, Topical, Q0600, Stark Jock, Jude, MD, 6 each at 01/03/19 0610 .  docusate sodium (COLACE) capsule 100 mg, 100 mg, Oral, Daily, Fritzi Mandes, MD, 100 mg at 01/03/19 0929 .  feeding supplement (NEPRO CARB STEADY) liquid 237 mL, 237 mL, Oral, Q24H, Gouru, Aruna, MD, 237 mL at 01/03/19 1224 .  folic acid (FOLVITE) tablet 1 mg, 1 mg, Oral, Daily, Lance Coon, MD, 1 mg at 01/03/19 0929 .  furosemide (LASIX) injection 40 mg, 40 mg, Intravenous, BID, Gouru, Aruna, MD, 40 mg at 01/03/19 0930 .  guaiFENesin-dextromethorphan (ROBITUSSIN DM) 100-10 MG/5ML syrup 5 mL, 5 mL, Oral, Q4H PRN, Gouru, Aruna, MD, 5 mL at 01/02/19 0618 .  magnesium oxide (MAG-OX) tablet 400 mg, 400 mg, Oral, BID, Lance Coon, MD, 400 mg at 01/03/19 0930 .  multivitamin with minerals tablet 1 tablet, 1 tablet, Oral, Daily, Lance Coon, MD, 1 tablet at 01/03/19 0929 .  mupirocin ointment (BACTROBAN) 2 %, , Nasal, BID, Ojie, Jude, MD .  ondansetron (ZOFRAN) tablet 4 mg, 4 mg, Oral, Q6H PRN, 4 mg at 01/03/19 1407 **OR** ondansetron (ZOFRAN) injection 4 mg, 4 mg, Intravenous, Q6H PRN, Lance Coon, MD .  thiamine (VITAMIN B-1) tablet 100 mg, 100 mg, Oral, Daily, 100 mg at 01/03/19 0930 **OR** thiamine (B-1) injection 100 mg,  100 mg, Intravenous, Daily, Lance Coon, MD .  tiotropium [employer] [facility]) inhalation capsule ([facility] use ONLY) 18 mcg, 18 mcg, Inhalation, Daily, Lance Coon, MD, 18 mcg at 01/03/19 1102 .  [START ON 01/04/2019] vancomycin (VANCOCIN) 1,250 mg in sodium chloride 0.9 % 250 mL IVPB, 1,250 mg, Intravenous, Q24H, Dallie Piles, [facility]    ALLERGIES   Patient has no known allergies.     REVIEW OF SYSTEMS    Review of Systems:  Gen:  Denies  fever, sweats, chills weigh loss  HEENT: Denies blurred vision, double vision, ear pain, eye pain, hearing loss, nose bleeds, sore throat Cardiac:  No dizziness, chest pain or heaviness, chest tightness,edema Resp:   Denies cough or sputum porduction, shortness of breath,wheezing, hemoptysis,  Gi: Denies swallowing difficulty, stomach pain, nausea or vomiting, diarrhea, constipation, bowel incontinence Gu:  Denies bladder incontinence, burning urine Ext:   Denies Joint pain, stiffness or swelling Skin: Denies  skin rash, easy bruising or bleeding or hives Endoc:  Denies polyuria, polydipsia , polyphagia or weight change Psych:   Denies depression, insomnia or hallucinations   Other:  All other systems negative   VS: BP (!) 122/97 (BP Location: Right Arm)   Pulse 74   Temp 98.5 F (36.9 C) (Oral)   Resp (!) 24   Ht 5\' 11"  (1.803 m)   Wt 99.8 kg   SpO2 91%   BMI 30.69 kg/m      PHYSICAL EXAM    GENERAL:NAD, no fevers, chills, no weakness no fatigue HEAD: Normocephalic, atraumatic.  EYES: Pupils equal, round, reactive to light. Extraocular muscles intact. No scleral icterus.  MOUTH: Moist mucosal membrane. Dentition intact. No abscess noted.  EAR, NOSE, THROAT: Clear without exudates. No external lesions.  NECK: Supple. No thyromegaly. No nodules. No JVD.  PULMONARY: decreased breath sounds bilaterally  CARDIOVASCULAR: S1 and S2. Regular rate and rhythm. No murmurs, rubs, or gallops. No edema. Pedal pulses 2+ bilaterally.  GASTROINTESTINAL:  Soft, nontender, nondistended. No masses. Positive bowel sounds. No hepatosplenomegaly.  MUSCULOSKELETAL: No swelling, clubbing, or edema. Range of motion full in all extremities.  NEUROLOGIC: Cranial nerves II through XII are intact. No gross focal neurological deficits. Sensation intact. Reflexes intact.  SKIN: No ulceration, lesions, rashes, or cyanosis. Skin warm and dry. Turgor intact.  PSYCHIATRIC: Mood, affect within normal limits. The patient is awake, alert and oriented x 3. Insight, judgment intact.       IMAGING    Dg Chest 1 View  Result Date: 12/31/2018 CLINICAL DATA:  SOB. EXAM: CHEST  1 VIEW COMPARISON:  12/30/2018. FINDINGS: Marked cardiac enlargement. Dual lead pacer/AICD. Mild vascular congestion. No  overt edema. Chronic elevation LEFT hemidiaphragm. No pneumothorax. Aortic atherosclerosis. IMPRESSION: Cardiomegaly. Mild vascular congestion without overt edema. Stable appearance from priors. Electronically Signed   By: Staci Righter M.D.   On: 12/31/2018 08:20   Ct Chest Wo Contrast  Result Date: 01/02/2019 CLINICAL DATA:  Dyspnea.  History of colon cancer. EXAM: CT CHEST WITHOUT CONTRAST TECHNIQUE: Multidetector CT imaging of the chest was performed following the standard protocol without IV contrast. COMPARISON:  Radiographs of December 31, 2018. CT scan of November 10, 2018. FINDINGS: Cardiovascular: Atherosclerosis of thoracic aorta is noted without aneurysm formation. Mild cardiomegaly is noted. Status post transcatheter aortic valve replacement. Coronary artery calcifications are noted. Mediastinum/Nodes: No enlarged mediastinal or axillary lymph nodes. Thyroid gland, trachea, and esophagus demonstrate no significant findings. Lungs/Pleura: No pneumothorax or pleural effusion is noted. Moderate left lower lobe subsegmental atelectasis or pneumonia is noted which is increased compared to prior exam. Mild right posterior basilar subsegmental atelectasis or inflammation is noted  posteriorly in right lower lobe. New ground-glass opacity seen posteriorly in the right upper lobe concerning for pneumonia. Upper Abdomen: No acute abnormality. Musculoskeletal: No chest wall mass or suspicious bone lesions identified. IMPRESSION: New ground-glass opacity seen posteriorly in right upper lobe concerning for pneumonia; this may represent typical or atypical infection, including viral etiology. Also noted are increased bilateral lower lobe opacities concerning for worsening atelectasis or pneumonia, left worse than right. Coronary artery calcifications are noted. Aortic Atherosclerosis (ICD10-I70.0). Electronically Signed   By: Marijo Conception, M.D.   On: 01/02/2019 16:18   Dg Chest Port 1 View  Result Date: 12/30/2018 CLINICAL DATA:  Progressive shortness of breath. EXAM: PORTABLE CHEST 1 VIEW COMPARISON:  Radiograph yesterday. CT 11/10/2018 FINDINGS: Similar cardiomegaly. Unchanged mediastinal contours. Aortic atherosclerosis with aortic root replacement. Left-sided pacemaker remains in place. Vascular congestion without overt pulmonary edema. Mild elevation of left hemidiaphragm. Improved atelectasis in the left mid lung. No large pleural effusion. No pneumothorax. Unchanged osseous structures. IMPRESSION: Cardiomegaly with vascular congestion. No overt pulmonary edema. Improved left midlung atelectasis. Aortic Atherosclerosis (ICD10-I70.0). Electronically Signed   By: Keith Rake M.D.   On: 12/30/2018 02:01   Dg Chest Portable 1 View  Result Date: 12/29/2018 CLINICAL DATA:  Weakness with apparent hypotension EXAM: PORTABLE CHEST 1 VIEW COMPARISON:  Chest radiograph November 05, 2018 and chest CT November 10, 2018 FINDINGS: There is atelectatic change in the left mid lung. There is no appreciable edema or consolidation. Heart is mildly enlarged with pulmonary vascularity normal. Pacemaker leads are attached the right atrium and right ventricle. There is aortic atherosclerosis. No  adenopathy. No bone lesions. There is calcification in each carotid artery. IMPRESSION: Left midlung atelectasis. No edema or consolidation. Mild cardiomegaly. Pacemaker leads attached to right atrium and right ventricle. Aortic Atherosclerosis (ICD10-I70.0). Bilateral carotid artery calcification noted. Electronically Signed   By: Lowella Grip III M.D.   On: 12/29/2018 21:14      ASSESSMENT/PLAN    Acute hypoxemic respiratory failure     -likely due to acute decompensated systolic CHF and Atelectasis     -Currently on nasal cannula 6L/min from baseline 2l/min.     -  BNP mildly elevated, CXR improved post diuresis - pt has 1 kidney    - s/p CT chest with Left lower lobe atelectasis and superimposed dependent edema although difficult to definitively rule out infiltrate. Pt +nasal MRSA and is empirically on Vancomycin    - no fevers, no elevated wbc count, normal resp viral panel, negative  influenza, point away from infectious etiology.  Currently covid patient under investigation under contact and airborne precautions     -net negative [id number]    - denies increased phelgm volume or color which point away from acute copd exacerbation    - would continue diuresis, GFR close to baseline, will increase Lasix to 40 bid    - Aggressive bronchopulmonary hygiene to recruit atelectatic segments - chest physiotherapy, flutter valve, IS   - will need outpatient workup for pulmonary hypertension via RHC - RVSP was significantly elevated on TTE will discuss with cardiology - appreciate input     -will schedule for pulmonary clinic follow up at [facility] pulmonology             Thank you for allowing me to participate in the care of this patient.   Patient/Family are satisfied with care plan and all questions have been answered.  This document was prepared using Dragon voice recognition software and may include unintentional dictation errors.     Ottie Glazier, M.D.  Division of [place]

## 2019-01-03 NOTE — Progress Notes (Signed)
1600--T- 103.2 oral.  Tylenol given PO.  SpO2 85% on 6L/min [place]. MEWS red zone.  Frequent VS initiated. Dr. Margaretmary Eddy paged.  1611--SBAR provided to Dr. Margaretmary Eddy. Orders received for high flow oxyen, and neb treatment.  RT aware of new orders.  1613--RT at bedside. Hi-flow Ox initiated.  Accessory use increasing.  1620--RR 28. SpO2 94% on 10 L/min [place].  ABG requested.  Dr. Margaretmary Eddy denied request for ABG. Blood cultures x2 ordered r/t fever.  1640--Respiratory effort continues to increase. O2 at 12 L/min hi-flow (increased by RT).  ABG denied by Dr. Margaretmary Eddy upon second request. Requested MD to come to bedside to evaluate pt.  1645--O2 increased to 15L/min.  RR 28 with accessory muscle use, increased WOB and air hunger noted. RRT called.  Dr. Margaretmary Eddy ordered to transfer pt to Step Down Unit.  Consult with Dr. Jamal Collin per Dr. Margaretmary Eddy.

## 2019-01-03 NOTE — Progress Notes (Signed)
PT Cancellation Note  Patient Details Name: Darius Taylor MRN: [id number] DOB: [birth year]   Cancelled Treatment:    Reason Eval/Treat Not Completed: Medical issues which prohibited therapy Pt now on droplet precautions and to be transferred upstairs and is pending for Covid testing.  Pt has been walking with PT, with increased O2 demand.  Will hold PT until further testing is cleared.  Kreg Shropshire, DPT 01/03/2019, 10:24 AM

## 2019-01-03 NOTE — Progress Notes (Signed)
Transferred to Step Down Unit (Rm ICU-4) via bed with oxygen at 15L/min NRB and IV.  Report given to Caryl Pina and [employer].

## 2019-01-03 NOTE — Progress Notes (Signed)
1600--T- 103.2 oral.  Tylenol given PO.  SpO2 85% on 6L/min [place]. MEWS red zone.  Frequent VS initiated. Dr. Margaretmary Eddy paged.  1611--SBAR provided to Dr. Margaretmary Eddy. Orders received for high flow oxyen, and neb treatment.  RT aware of new orders.  1613--RT at bedside. Hi-flow Ox initiated.  Accessory use increasing.  1620--RR 28. SpO2 94% on 10 L/min [place].  ABG requested.  Dr. Margaretmary Eddy denied request for ABG. Blood cultures x2 ordered r/t fever.  1640--Respiratory effort continues to increase. O2 at 12 L/min hi-flow (increased by RT).  ABG denied by Dr. Margaretmary Eddy upon second request. Requested MD to come to bedside to evaluate pt.  1645--O2 increased to 15L/min.  RR 28 with accessory muscle use, increased WOB and air hunger noted. RRT called.  Dr. Margaretmary Eddy ordered to transfer pt to Step Down Unit.  Consult with Dr. Jamal Collin per Dr. Margaretmary Eddy.  1706--ABG completed by RT.  Converted to NRB at 100% FiO2.

## 2019-01-03 NOTE — Progress Notes (Addendum)
[name] [facility] [place] NAME: Darius Taylor    MR#:  [id number]  DATE OF BIRTH:  26-Jan-1946  SUBJECTIVE:  CHIEF COMPLAINT:   Chief Complaint  Patient presents with   Weakness    Patient is still  requiring 6 L of oxygen today, a.m. but became more hypoxemic and febrile, desaturating, requiring more oxygen  Ct scan with opacity,pt reports using 2 L of oxygen as needed at home.  No fevers.  Reports decreased appetite  REVIEW OF SYSTEMS:  Review of Systems  Constitutional: Negative for chills and fever.  HENT: Negative for hearing loss and tinnitus.   Eyes: Negative for blurred vision and double vision.  Respiratory: Negative for cough and shortness of breath.   Cardiovascular: Negative for chest pain and palpitations.  Gastrointestinal: Negative for heartburn and nausea.  Genitourinary: Negative for dysuria and urgency.  Musculoskeletal: Negative for neck pain.  Skin: Negative for itching and rash.  Neurological: Negative for dizziness.  Psychiatric/Behavioral: Negative for depression and hallucinations.    DRUG ALLERGIES:  No Known Allergies VITALS:  Blood pressure (!) 122/97, pulse 74, temperature 98.5 F (36.9 C), temperature source Oral, resp. rate (!) 24, height 5\' 11"  (1.803 m), weight 99.8 kg, SpO2 91 %. PHYSICAL EXAMINATION:  Physical Exam  Physical Exam  Constitutional: He is oriented to person, place, and time. He appears well-developed.  HENT:  Head: Normocephalic and atraumatic.  Right Ear: External ear normal.  Eyes: Pupils are equal, round, and reactive to light. Conjunctivae are normal. Right eye exhibits no discharge.  Neck: Normal range of motion. Neck supple. No tracheal deviation present.  Cardiovascular:  Irregularly irregular  Respiratory: Effort normal and breath sounds normal. He has no rales.  GI: Soft. Bowel sounds are normal.  Musculoskeletal: Normal range of motion.        General: No edema.    Neurological: He is alert and oriented to person, place, and time.  Skin: Skin is warm. He is not diaphoretic. No erythema.  Psychiatric: He has a normal mood and affect. His behavior is normal.   LABORATORY PANEL:  Male CBC Recent Labs  Lab 01/02/19 0828  WBC 9.6  HGB 10.1*  HCT 32.2*  PLT 158   ------------------------------------------------------------------------------------------------------------------ Chemistries  Recent Labs  Lab 12/29/18 2119  01/01/19 0550  01/03/19 0410  NA  --    < > 138   < > 136  K  --    < > 4.4   < > 4.6  CL  --    < > 101   < > 98  CO2  --    < > 28   < > 29  GLUCOSE  --    < > 104*   < > 116*  BUN  --    < > 35*   < > 38*  CREATININE  --    < > 1.58*   < > 1.58*  CALCIUM  --    < > 9.0   < > 8.7*  MG  --    < > 2.5*  --   --   AST 160*  --   --   --   --   ALT 130*  --   --   --   --   ALKPHOS 97  --   --   --   --   BILITOT 1.1  --   --   --   --    < > =  values in this interval not displayed.   RADIOLOGY:  Ct Chest Wo Contrast  Result Date: 01/02/2019 CLINICAL DATA:  Dyspnea.  History of colon cancer. EXAM: CT CHEST WITHOUT CONTRAST TECHNIQUE: Multidetector CT imaging of the chest was performed following the standard protocol without IV contrast. COMPARISON:  Radiographs of December 31, 2018. CT scan of November 10, 2018. FINDINGS: Cardiovascular: Atherosclerosis of thoracic aorta is noted without aneurysm formation. Mild cardiomegaly is noted. Status post transcatheter aortic valve replacement. Coronary artery calcifications are noted. Mediastinum/Nodes: No enlarged mediastinal or axillary lymph nodes. Thyroid gland, trachea, and esophagus demonstrate no significant findings. Lungs/Pleura: No pneumothorax or pleural effusion is noted. Moderate left lower lobe subsegmental atelectasis or pneumonia is noted which is increased compared to prior exam. Mild right posterior basilar subsegmental atelectasis or inflammation is noted posteriorly in  right lower lobe. New ground-glass opacity seen posteriorly in the right upper lobe concerning for pneumonia. Upper Abdomen: No acute abnormality. Musculoskeletal: No chest wall mass or suspicious bone lesions identified. IMPRESSION: New ground-glass opacity seen posteriorly in right upper lobe concerning for pneumonia; this may represent typical or atypical infection, including viral etiology. Also noted are increased bilateral lower lobe opacities concerning for worsening atelectasis or pneumonia, left worse than right. Coronary artery calcifications are noted. Aortic Atherosclerosis (ICD10-I70.0). Electronically Signed   By: Marijo Conception, M.D.   On: 01/02/2019 16:18   ASSESSMENT AND PLAN:   1.  Acute on chronic hypoxic respiratory failure Healthcare associated pneumonia and acute on chronic CHF diastolic CT scan with new groundglass opacity and bilateral lower lobe opacity concerning for pneumonia Cefepime and vancomycin Flu test neg, respiratory panel  Neg  pending covid test, airborne precautions Patient uses 2 L of home oxygen therapy at home.  Currently on 10-12 L of high flow oxygen   pulmonology following , discussed with Dr. Luvenia Heller, transferring patient to stepdown unit, notify Dr. Jamal Collin he is aware IV Lasix Monitor intake and output  2.  Acute kidney injury superimposed on chronic kidney disease stage III Likely due to recent alcohol use leading to dehydration.  Renal function improved slightly with initial IV fluid hydration.  IV fluid had to be discontinued in the emergency room due to patient being short of breath.  Chest x-ray done showed mild pulmonary vascular congestion.  Was given a dose of IV Lasix in the emergency room.   Renal function gradually improving.  Avoid nephrotoxic agents    3.  acute on chronic diastolic CHF Chest x-ray with mild vascular congestion without overt edema  20 mg of IV Lasix as renal function is improving.  Extra dose of IV Lasix Wean down oxygen  to baseline 2 L Echocardiogram 220 has revealed 50 to 55% ejection fraction with normal systolic function cardiology -[place] following   4.  CAD Stable.  5.  COPD Stable No continue nebs  6.  Paroxysmal atrial fibrillation Rate controlled Patient on anticoagulation with Eliquis  7. Alcohol intoxication Patient clinically stable this morning.  Alcohol level was 122.  Patient denies drinking alcohol on a daily basis. Monitor for any signs of alcohol withdrawal.  Continue thiamine, multivitamin and folic acid   8 generalized weakness Physical therapist evaluated patient and recommended home health with physical therapy.  9.  Decreased appetite dietitian consult  DVT prophylaxis; patient on Eliquis  Disposition; plans to discharge home tomorrow if respiratory status improves and remains stable  All the records are reviewed and case discussed with Care Management/Social Worker. Management plans discussed  with the patient, discussed with the patient's daughter Arizona Constable over phone [phone] she is in agreement.  CODE STATUS: Full Code  TOTAL CRITICAL CARE TIME TAKING CARE OF THIS PATIENT: 45 minutes.   More than 50% of the time was spent in counseling/coordination of care: YES  POSSIBLE D/C IN 2 DAY, DEPENDING ON CLINICAL CONDITION.   Nicholes Mango M.D on 01/03/2019 at 3:25 PM  Between 7am to 6pm - Pager - [phone]  After 6pm go to [email or web address] - Technical brewer [place] [facility]  Office  [phone]  CC: Primary care physician; [employer]  Note: This dictation was prepared with Diplomatic Services operational officer dictation along with smaller Company secretary. Any transcriptional errors that result from this process are unintentional.

## 2019-01-03 NOTE — Progress Notes (Signed)
   01/03/19 1600  Clinical Encounter Type  Visited With Patient not available;Health care provider  Visit Type Initial;Other (Comment)  Referral From Nurse  Consult/Referral To Chaplain  Chaplain received page for patient. Chaplain arrived and check with [facility], she told Chaplain it was ok to leave the patient was a ruled out. AC said "Thank you for coming"

## 2019-01-03 NOTE — Progress Notes (Signed)
Patient transferred to 2A.  Report given to [employer].

## 2019-01-03 NOTE — Consult Note (Signed)
Pharmacy Antibiotic Note  Darius Taylor is a 73 y.o. male admitted on 12/29/2018 with a CHF exacerbation now with suspected pneumonia on CXR.  Pharmacy has been consulted for vancomycin and cefepime dosing. SCr appears to be approximately at baseline  Plan:  1) Vancomycin 1250 mg IV Q 24 hrs following 2000 mg loading dose Goal AUC 400-550 Expected AUC: 475.7 BMI 30.7 SCr used: 1.58  2) cefepime 2 grams IV every 12 hours   Height: 5\' 11"  (180.3 cm) Weight: 220 lb 0.3 oz (99.8 kg) IBW/kg (Calculated) : 75.3  Temp (24hrs), Avg:98.7 F (37.1 C), Min:98.3 F (36.8 C), Max:99.4 F (37.4 C)  Recent Labs  Lab 12/29/18 2044  12/30/18 0513 12/31/18 0254 01/01/19 0550 01/02/19 0828 01/03/19 0410  WBC 8.1  8.5  --  9.9  --   --  9.6  --   CREATININE 2.12*   < > 2.12* 1.82* 1.58* 1.57* 1.58*   < > = values in this interval not displayed.    Estimated Creatinine Clearance: 49.4 mL/min (A) (by C-G formula based on SCr of 1.58 mg/dL (H)).    No Known Allergies  Antimicrobials this admission: vancomycin 4/11 >>  cefepime 4/11 >>   Microbiology results: 4/7 MRSA PCR: positive  Thank you for allowing pharmacy to be a part of this patient's care.  Dallie Piles, PharmD 01/03/2019 9:45 AM

## 2019-01-04 DIAGNOSIS — J9601 Acute respiratory failure with hypoxia: Secondary | ICD-10-CM

## 2019-01-04 DIAGNOSIS — J181 Lobar pneumonia, unspecified organism: Secondary | ICD-10-CM

## 2019-01-04 DIAGNOSIS — J810 Acute pulmonary edema: Secondary | ICD-10-CM

## 2019-01-04 LAB — BASIC METABOLIC PANEL
Anion gap: 10 (ref 5–15)
BUN: 43 mg/dL — ABNORMAL HIGH (ref 8–23)
CO2: 29 mmol/L (ref 22–32)
Calcium: 8.5 mg/dL — ABNORMAL LOW (ref 8.9–10.3)
Chloride: 98 mmol/L (ref 98–111)
Creatinine, Ser: 1.94 mg/dL — ABNORMAL HIGH (ref 0.61–1.24)
GFR calc Af Amer: 38 mL/min — ABNORMAL LOW (ref >60)
GFR calc non Af Amer: 33 mL/min — ABNORMAL LOW (ref >60)
Glucose, Bld: 128 mg/dL — ABNORMAL HIGH (ref 70–99)
Potassium: 4.4 mmol/L (ref 3.5–5.1)
Sodium: 137 mmol/L (ref 135–145)

## 2019-01-04 LAB — CBC WITH DIFFERENTIAL/PLATELET
Abs Immature Granulocytes: 0.06 10*3/uL (ref 0.00–0.07)
Basophils Absolute: 0 10*3/uL (ref 0.0–0.1)
Basophils Relative: 0 %
Eosinophils Absolute: 0.2 10*3/uL (ref 0.0–0.5)
Eosinophils Relative: 2 %
HCT: 27.8 % — ABNORMAL LOW (ref 39.0–52.0)
Hemoglobin: 8.7 g/dL — ABNORMAL LOW (ref 13.0–17.0)
Immature Granulocytes: 1 %
Lymphocytes Relative: 3 %
Lymphs Abs: 0.3 10*3/uL — ABNORMAL LOW (ref 0.7–4.0)
MCH: 30 pg (ref 26.0–34.0)
MCHC: 31.3 g/dL (ref 30.0–36.0)
MCV: 95.9 fL (ref 80.0–100.0)
Monocytes Absolute: 0.9 10*3/uL (ref 0.1–1.0)
Monocytes Relative: 7 %
Neutro Abs: 11.8 10*3/uL — ABNORMAL HIGH (ref 1.7–7.7)
Neutrophils Relative %: 87 %
Platelets: 147 10*3/uL — ABNORMAL LOW (ref 150–400)
RBC: 2.9 MIL/uL — ABNORMAL LOW (ref 4.22–5.81)
RDW: 16.2 % — ABNORMAL HIGH (ref 11.5–15.5)
WBC: 13.3 10*3/uL — ABNORMAL HIGH (ref 4.0–10.5)
nRBC: 0 % (ref 0.0–0.2)

## 2019-01-04 LAB — VANCOMYCIN, RANDOM: Vancomycin Rm: 18

## 2019-01-04 MED ORDER — SODIUM CHLORIDE 0.9 % IV SOLN
INTRAVENOUS | Status: DC | PRN
  Administered 2019-01-04: 250 mL via INTRAVENOUS
  Administered 2019-01-10: 500 mL via INTRAVENOUS

## 2019-01-04 MED ORDER — FUROSEMIDE 10 MG/ML IJ SOLN
40.0000 mg | Freq: Every day | INTRAMUSCULAR | Status: DC
  Administered 2019-01-04: 13:00:00 40 mg via INTRAVENOUS
  Filled 2019-01-04: qty 4

## 2019-01-04 NOTE — Progress Notes (Signed)
Pt transferred from Step Down to 2A.  Pt receiving oxygen at 5L/min via HFNC.  Pt oriented to room.  Belongings within reach.  Call bell within reach. VSS.  Central telemetry monitoring contacted--NSR on cardiac monitor.

## 2019-01-04 NOTE — Progress Notes (Addendum)
No new complaints.  No distress.  Over the course of the day oxygen requirements have improved and he is no longer on HF [place].  Presently on 6 LPM by [place].  He does have cough productive of white phlegm.  Denies hemoptysis and pleuritic chest pain.  Vitals:   01/04/19 0800 01/04/19 0900 01/04/19 1000 01/04/19 1044  BP: 128/79  110/80   Pulse: 90 97 89 90  Resp:  (!) 25 18 18   Temp: (!) 101.4 F (38.6 C)     TempSrc: Oral     SpO2: 99% 94% 94% 93%  Weight:      Height:      6 LPM [place]  Gen: NAD, obese HEENT: NCAT, sclerae white Neck: JVP not visualized Lungs: Diminished breath sounds, bibasilar bronchial breath sounds and crackles, no wheezes Cardiovascular: RRR, no murmurs Abdomen: Soft, nontender, normal BS Ext: without clubbing, cyanosis, edema Neuro: grossly intact Skin: Limited exam, no lesions noted  BMP Latest Ref Rng & Units 01/04/2019 01/03/2019 01/02/2019  Glucose 70 - 99 mg/dL 128(H) 116(H) 156(H)  BUN 8 - 23 mg/dL 43(H) 38(H) 30(H)  Creatinine 0.61 - 1.24 mg/dL 1.94(H) 1.58(H) 1.57(H)  Sodium 135 - 145 mmol/L 137 136 134(L)  Potassium 3.5 - 5.1 mmol/L 4.4 4.6 5.3(H)  Chloride 98 - 111 mmol/L 98 98 100  CO2 22 - 32 mmol/L 29 29 25   Calcium 8.9 - 10.3 mg/dL 8.5(L) 8.7(L) 8.9   CBC Latest Ref Rng & Units 01/04/2019 01/02/2019 12/30/2018  WBC 4.0 - 10.5 K/uL 13.3(H) 9.6 9.9  Hemoglobin 13.0 - 17.0 g/dL 8.7(L) 10.1(L) 10.6(L)  Hematocrit 39.0 - 52.0 % 27.8(L) 32.2(L) 32.8(L)  Platelets 150 - 400 K/uL 147(L) 158 210   CXR: No new film  PCT: 16.48   IMPRESSION: Acute hypoxemic respiratory failure - improving O2 requirements Probable component of pulmonary edema Possible component of acute lung injury Elevated PCT, suspect PNA based on CT chest and exam  PLAN/REC: Continue supplemental oxygen to maintain SPO2 >90% Continue current antibiotics Decrease furosemide to once a day Transfer to MedSurg floor with cardiac monitoring Dr. Lanney Gins will continue to follow after  transfer CXR ordered for 4/13  Merton Border, MD PCCM service Mobile [phone] Pager [phone] 01/04/2019 2:59 PM

## 2019-01-04 NOTE — Progress Notes (Signed)
PT complains of Nausea . PRN Zofran given, See MAR. Will continue to monitor.

## 2019-01-04 NOTE — Progress Notes (Signed)
PT Cancellation Note  Patient Details Name: Darius Taylor MRN: [id number] DOB: 28-Feb-1946   Cancelled Treatment:    Reason Eval/Treat Not Completed: Medical issues which prohibited therapy Pt transferred to CCU, has had decline and is now pending Covid testing. Will complete PT orders at this time.  Kreg Shropshire, DPT 01/04/2019, 10:36 AM

## 2019-01-04 NOTE — Consult Note (Signed)
Pharmacy Antibiotic Note  Darius Taylor is a 73 y.o. male admitted on 12/29/2018 with a CHF exacerbation now with suspected pneumonia on CXR.  Pharmacy has been consulted for vancomycin and cefepime dosing. SCr appears to be approximately at baseline -covid r/o pt  Plan:  1) Vancomycin 1250 mg IV Q 24 hrs following 2000 mg loading dose Goal AUC 400-550 Expected AUC: 475.7 BMI 30.7 SCr used: 1.58 2) cefepime 2 grams IV every 12 hours  4/12:  Scr increase from 1.58 to 1.94. Patient has 1 kidney per Pulm note. Will check random Vancomycin level prior to scheduled dose tonight to evaluate.    Height: 5\' 11"  (180.3 cm) Weight: 215 lb 9.8 oz (97.8 kg) IBW/kg (Calculated) : 75.3  Temp (24hrs), Avg:101 F (38.3 C), Min:98.4 F (36.9 C), Max:103.2 F (39.6 C)  Recent Labs  Lab 12/29/18 2044  12/30/18 0513 12/31/18 0254 01/01/19 0550 01/02/19 0828 01/03/19 0410 01/04/19 0418  WBC 8.1  8.5  --  9.9  --   --  9.6  --  13.3*  CREATININE 2.12*   < > 2.12* 1.82* 1.58* 1.57* 1.58* 1.94*   < > = values in this interval not displayed.    Estimated Creatinine Clearance: 39.8 mL/min (A) (by C-G formula based on SCr of 1.94 mg/dL (H)).    No Known Allergies  Antimicrobials this admission: vancomycin 4/11 >>  cefepime 4/11 >>   Microbiology results: 4/7 MRSA PCR: positive 4/11 MRSA PCR negative?  Thank you for allowing pharmacy to be a part of this patient's care.  Gray Doering A, PharmD 01/04/2019 12:44 PM

## 2019-01-04 NOTE — Consult Note (Signed)
Pharmacy Antibiotic Note  Darius Taylor is a 73 y.o. male admitted on 12/29/2018 with a CHF exacerbation now with suspected pneumonia on CXR.  Pharmacy has been consulted for vancomycin and cefepime dosing. SCr appears to be approximately at baseline -covid r/o pt  Plan: 04/12 @ 2300 VR 18 mcg/mL. Will continue current regimen and will check renal function on CMP w/ am labs.    Height: 5\' 11"  (180.3 cm) Weight: 221 lb 9 oz (100.5 kg) IBW/kg (Calculated) : 75.3  Temp (24hrs), Avg:99.3 F (37.4 C), Min:98.6 F (37 C), Max:101.4 F (38.6 C)  Recent Labs  Lab 12/29/18 2044  12/30/18 0513 12/31/18 0254 01/01/19 0550 01/02/19 0828 01/03/19 0410 01/04/19 0418 01/04/19 2304  WBC 8.1  8.5  --  9.9  --   --  9.6  --  13.3*  --   CREATININE 2.12*   < > 2.12* 1.82* 1.58* 1.57* 1.58* 1.94*  --   VANCORANDOM  --   --   --   --   --   --   --   --  18   < > = values in this interval not displayed.    Estimated Creatinine Clearance: 40.4 mL/min (A) (by C-G formula based on SCr of 1.94 mg/dL (H)).    No Known Allergies  Antimicrobials this admission: vancomycin 4/11 >>  cefepime 4/11 >>   Microbiology results: 4/7 MRSA PCR: positive 4/11 MRSA PCR negative?  Thank you for allowing pharmacy to be a part of this patient's care.  Tobie Lords, PharmD 01/04/2019 11:59 PM

## 2019-01-04 NOTE — Progress Notes (Signed)
PT on [place] 8 L, Denies SOB. Pt also denies having N/V at this time. Will continue to  Monitor.

## 2019-01-04 NOTE — Progress Notes (Signed)
Pt on 6 L [place] per Dr. Alva Garnet. Denies SOB at this time.

## 2019-01-04 NOTE — Progress Notes (Signed)
[facility] [place] NAME: Darius Taylor    MR#:  [id number]  DATE OF BIRTH:  June 16, 1946  SUBJECTIVE:  CHIEF COMPLAINT:   Chief Complaint  Patient presents with  . Weakness    Patient is on high flow oxygen but feeling better as per my discussion with the medical staff pt reports using 2 L of oxygen as needed at home.  No fevers.  Reports decreased appetite  REVIEW OF SYSTEMS:  Review of Systems  Constitutional: Negative for chills and fever.  HENT: Negative for hearing loss and tinnitus.   Eyes: Negative for blurred vision and double vision.  Respiratory: Negative for cough and shortness of breath.   Cardiovascular: Negative for chest pain and palpitations.  Gastrointestinal: Negative for heartburn and nausea.  Genitourinary: Negative for dysuria and urgency.  Musculoskeletal: Negative for neck pain.  Skin: Negative for itching and rash.  Neurological: Negative for dizziness.  Psychiatric/Behavioral: Negative for depression and hallucinations.    DRUG ALLERGIES:  No Known Allergies VITALS:  Blood pressure 110/80, pulse 90, temperature (!) 101.4 F (38.6 C), temperature source Oral, resp. rate 18, height 5\' 11"  (1.803 m), weight 97.8 kg, SpO2 93 %. PHYSICAL EXAMINATION:  Physical Exam  Physical Exam -not done today .   LABORATORY PANEL:  Male CBC Recent Labs  Lab 01/04/19 0418  WBC 13.3*  HGB 8.7*  HCT 27.8*  PLT 147*   ------------------------------------------------------------------------------------------------------------------ Chemistries  Recent Labs  Lab 12/29/18 2119  01/01/19 0550  01/04/19 0418  NA  --    < > 138   < > 137  K  --    < > 4.4   < > 4.4  CL  --    < > 101   < > 98  CO2  --    < > 28   < > 29  GLUCOSE  --    < > 104*   < > 128*  BUN  --    < > 35*   < > 43*  CREATININE  --    < > 1.58*   < > 1.94*  CALCIUM  --    < > 9.0   < > 8.5*  MG  --    < > 2.5*  --   --   AST 160*  --   --    --   --   ALT 130*  --   --   --   --   ALKPHOS 97  --   --   --   --   BILITOT 1.1  --   --   --   --    < > = values in this interval not displayed.   RADIOLOGY:  No results found. ASSESSMENT AND PLAN:   1.  Acute on chronic hypoxic respiratory failure Healthcare associated pneumonia and acute on chronic CHF diastolic CT scan with new groundglass opacity and bilateral lower lobe opacity concerning for pneumonia Cefepime and vancomycin Flu test neg, respiratory panel  Neg  pending covid test, airborne precautions Patient uses 2 L of home oxygen therapy at home.  Currently on  high flow oxygen   pulmonology following IV Lasix-hold as renal function is getting worse Monitor intake and output  2.  Acute kidney injury superimposed on chronic kidney disease stage III Initially due to recent alcohol use leading to dehydration.   Renal function is getting worse 1.58-1.94  chest x-ray done showed mild  pulmonary vascular congestion.  Was given a dose of IV Lasix in the emergency room.   Marland Kitchen  Avoid nephrotoxic agents    3.  acute on chronic diastolic CHF Chest x-ray with mild vascular congestion without overt edema Hold Lasix Wean down oxygen to baseline 2 L Echocardiogram 220 has revealed 50 to 55% ejection fraction with normal systolic function cardiology -[employer] following   4.  CAD Stable.  5.  COPD Stable No continue nebs  6.  Paroxysmal atrial fibrillation Rate controlled Patient on anticoagulation with Eliquis  7. Alcohol intoxication Patient clinically stable this morning.  Alcohol level was 122.  Patient denies drinking alcohol on a daily basis. Monitor for any signs of alcohol withdrawal.  Continue thiamine, multivitamin and folic acid   8 generalized weakness Physical therapist evaluated patient and recommended home health with physical therapy.  9.  Decreased appetite dietitian consult  DVT prophylaxis; patient on Eliquis  Disposition; plans to discharge home  tomorrow if respiratory status improves and remains stable  All the records are reviewed and case discussed with Care Management/Social Worker. Management plans discussed with the patient, discussed with the patient's daughter Arizona Constable over phone [phone] she is in agreement.  CODE STATUS: Full Code  Nonbillable rounding  More than 50% of the time was spent in counseling/coordination of care: YES  POSSIBLE D/C IN 2 DAY, DEPENDING ON CLINICAL CONDITION.   Nicholes Mango M.D on 01/04/2019 at 2:27 PM  Between 7am to 6pm - Pager - [phone]  After 6pm go to [email or web address] - Technical brewer [facility]  Office  [phone]  CC: Primary care physician; Inc, [employer]  Note: This dictation was prepared with Diplomatic Services operational officer dictation along with smaller Company secretary. Any transcriptional errors that result from this process are unintentional.

## 2019-01-05 ENCOUNTER — Encounter: Payer: Self-pay | Admitting: *Deleted

## 2019-01-05 ENCOUNTER — Inpatient Hospital Stay: Payer: Medicare HMO

## 2019-01-05 DIAGNOSIS — I5032 Chronic diastolic (congestive) heart failure: Secondary | ICD-10-CM

## 2019-01-05 DIAGNOSIS — N170 Acute kidney failure with tubular necrosis: Secondary | ICD-10-CM

## 2019-01-05 DIAGNOSIS — N184 Chronic kidney disease, stage 4 (severe): Secondary | ICD-10-CM

## 2019-01-05 LAB — CBC
HCT: 28 % — ABNORMAL LOW (ref 39.0–52.0)
Hemoglobin: 8.7 g/dL — ABNORMAL LOW (ref 13.0–17.0)
MCH: 30.2 pg (ref 26.0–34.0)
MCHC: 31.1 g/dL (ref 30.0–36.0)
MCV: 97.2 fL (ref 80.0–100.0)
Platelets: 161 10*3/uL (ref 150–400)
RBC: 2.88 MIL/uL — ABNORMAL LOW (ref 4.22–5.81)
RDW: 16 % — ABNORMAL HIGH (ref 11.5–15.5)
WBC: 11.1 10*3/uL — ABNORMAL HIGH (ref 4.0–10.5)
nRBC: 0 % (ref 0.0–0.2)

## 2019-01-05 LAB — COMPREHENSIVE METABOLIC PANEL
ALT: 83 U/L — ABNORMAL HIGH (ref 0–44)
AST: 70 U/L — ABNORMAL HIGH (ref 15–41)
Albumin: 2.8 g/dL — ABNORMAL LOW (ref 3.5–5.0)
Alkaline Phosphatase: 102 U/L (ref 38–126)
Anion gap: 8 (ref 5–15)
BUN: 48 mg/dL — ABNORMAL HIGH (ref 8–23)
CO2: 29 mmol/L (ref 22–32)
Calcium: 8.7 mg/dL — ABNORMAL LOW (ref 8.9–10.3)
Chloride: 100 mmol/L (ref 98–111)
Creatinine, Ser: 2.01 mg/dL — ABNORMAL HIGH (ref 0.61–1.24)
GFR calc Af Amer: 37 mL/min — ABNORMAL LOW (ref >60)
GFR calc non Af Amer: 32 mL/min — ABNORMAL LOW (ref >60)
Glucose, Bld: 153 mg/dL — ABNORMAL HIGH (ref 70–99)
Potassium: 3.8 mmol/L (ref 3.5–5.1)
Sodium: 137 mmol/L (ref 135–145)
Total Bilirubin: 1 mg/dL (ref 0.3–1.2)
Total Protein: 6.8 g/dL (ref 6.5–8.1)

## 2019-01-05 LAB — BASIC METABOLIC PANEL
Anion gap: 10 (ref 5–15)
BUN: 53 mg/dL — ABNORMAL HIGH (ref 8–23)
CO2: 29 mmol/L (ref 22–32)
Calcium: 9 mg/dL (ref 8.9–10.3)
Chloride: 98 mmol/L (ref 98–111)
Creatinine, Ser: 1.92 mg/dL — ABNORMAL HIGH (ref 0.61–1.24)
GFR calc Af Amer: 39 mL/min — ABNORMAL LOW (ref >60)
GFR calc non Af Amer: 34 mL/min — ABNORMAL LOW (ref >60)
Glucose, Bld: 122 mg/dL — ABNORMAL HIGH (ref 70–99)
Potassium: 4 mmol/L (ref 3.5–5.1)
Sodium: 137 mmol/L (ref 135–145)

## 2019-01-05 LAB — VANCOMYCIN, RANDOM: Vancomycin Rm: 16

## 2019-01-05 LAB — BRAIN NATRIURETIC PEPTIDE: B Natriuretic Peptide: 217 pg/mL — ABNORMAL HIGH (ref 0.0–100.0)

## 2019-01-05 LAB — GLUCOSE, CAPILLARY: Glucose-Capillary: 168 mg/dL — ABNORMAL HIGH (ref 70–99)

## 2019-01-05 LAB — NOVEL CORONAVIRUS, NAA (HOSP ORDER, SEND-OUT TO REF LAB; TAT 18-24 HRS): SARS-CoV-2, NAA: NOT DETECTED

## 2019-01-05 MED ORDER — VANCOMYCIN HCL 1000 MG IV SOLR
1000.0000 mg | INTRAVENOUS | Status: DC
  Filled 2019-01-05: qty 1000

## 2019-01-05 MED ORDER — FUROSEMIDE 10 MG/ML IJ SOLN
40.0000 mg | Freq: Once | INTRAMUSCULAR | Status: AC
  Administered 2019-01-05: 15:00:00 40 mg via INTRAVENOUS
  Filled 2019-01-05: qty 4

## 2019-01-05 MED ORDER — IPRATROPIUM-ALBUTEROL 20-100 MCG/ACT IN AERS
1.0000 | INHALATION_SPRAY | Freq: Four times a day (QID) | RESPIRATORY_TRACT | Status: DC
  Administered 2019-01-05 – 2019-01-13 (×18): 1 via RESPIRATORY_TRACT
  Filled 2019-01-05 (×2): qty 4

## 2019-01-05 MED ORDER — ALBUTEROL SULFATE HFA 108 (90 BASE) MCG/ACT IN AERS
1.0000 | INHALATION_SPRAY | RESPIRATORY_TRACT | Status: DC | PRN
  Administered 2019-01-05: 14:00:00 2 via RESPIRATORY_TRACT
  Filled 2019-01-05: qty 6.7

## 2019-01-05 MED ORDER — AEROCHAMBER PLUS FLO-VU MISC
1.0000 | Freq: Once | Status: DC
  Filled 2019-01-05: qty 1

## 2019-01-05 MED ORDER — VANCOMYCIN HCL IN DEXTROSE 1-5 GM/200ML-% IV SOLN
1000.0000 mg | INTRAVENOUS | Status: DC
  Administered 2019-01-05: 1000 mg via INTRAVENOUS
  Filled 2019-01-05 (×2): qty 200

## 2019-01-05 NOTE — Progress Notes (Addendum)
Tried to call the ICU twice. No answer. Will re-attempt in a few moments. [name] [name]  Three times now. Will keep trying. [name] [name]

## 2019-01-05 NOTE — Progress Notes (Signed)
Patient to transfer to the ICU at some point this afternoon. Pulse ox remains low (mid 80's), states difficulty breathing. Will continue to monitor. [name] [facility]

## 2019-01-05 NOTE — Progress Notes (Signed)
Ch spoke via telephone with the pt briefly. Pt shared that he did want prayer while the ch spoke w/ pt. Pt shared that he was improving somewhat but still seemed to hv labored breathing. Ch did active listening w/ pt, read Psalms 61, and prayed for his healing. Pt was appreciative of the call.    01/05/19 1000  Clinical Encounter Type  Visited With Patient  Visit Type Psychological support;Spiritual support;Social support  Spiritual Encounters  Spiritual Needs Prayer;[facility] text;Emotional;Grief support  Stress Factors  Patient Stress Factors Major life changes;Health changes  Family Stress Factors None identified

## 2019-01-05 NOTE — Consult Note (Signed)
Pharmacy Antibiotic Note  Darius Taylor is a 73 y.o. male admitted on 12/29/2018 with a CHF exacerbation now with suspected pneumonia on CXR.  Pharmacy has been consulted for vancomycin and cefepime dosing. SCr appears to be approximately at baseline -covid r/o pt  04/13 @ 0500 Scr 2.01 << 1.94. Will decrease the dose of vanc and check a random level and a BMP @ 2300 prior to tonight's dose to evaluate renal fx and to ensure patient is not stacking.  Plan: 4/13 Vanc Random Level 16. Will continue with current dose of Vancomycin 1g IV every 24 hours. Will check Scr with AM labs and continue to adjust dose as needed.    Height: 5\' 11"  (180.3 cm) Weight: 221 lb 9 oz (100.5 kg) IBW/kg (Calculated) : 75.3  Temp (24hrs), Avg:99.3 F (37.4 C), Min:98.9 F (37.2 C), Max:100.2 F (37.9 C)  Recent Labs  Lab 12/30/18 0513  01/02/19 0828 01/03/19 0410 01/04/19 0418 01/04/19 2304 01/05/19 0324 01/05/19 2256  WBC 9.9  --  9.6  --  13.3*  --  11.1*  --   CREATININE 2.12*   < > 1.57* 1.58* 1.94*  --  2.01* 1.92*  VANCORANDOM  --   --   --   --   --  18  --  16   < > = values in this interval not displayed.    Estimated Creatinine Clearance: 40.8 mL/min (A) (by C-G formula based on SCr of 1.92 mg/dL (H)).    No Known Allergies  Antimicrobials this admission: vancomycin 4/11 >>  cefepime 4/11 >>   Microbiology results: 4/7 MRSA PCR: positive 4/11 MRSA PCR negative?  Thank you for allowing pharmacy to be a part of this patient's care.  Pernell Dupre, PharmD, BCPS Clinical Pharmacist 01/05/2019 11:51 PM

## 2019-01-05 NOTE — Consult Note (Addendum)
Pharmacy Antibiotic Note  Darius Taylor is a 73 y.o. male admitted on 12/29/2018 with a CHF exacerbation now with suspected pneumonia on CXR.  Pharmacy has been consulted for vancomycin and cefepime dosing. SCr appears to be approximately at baseline -covid r/o pt  Plan: 04/13 @ 0500 Scr 2.01 << 1.94. Will decrease the dose of vanc and check a random level and a BMP @ 2300 prior to tonight's dose to evaluate renal fx and to ensure patient is not stacking.   Height: 5\' 11"  (180.3 cm) Weight: 221 lb 9 oz (100.5 kg) IBW/kg (Calculated) : 75.3  Temp (24hrs), Avg:99.3 F (37.4 C), Min:98.6 F (37 C), Max:101.4 F (38.6 C)  Recent Labs  Lab 12/29/18 2044  12/30/18 0513  01/01/19 0550 01/02/19 0828 01/03/19 0410 01/04/19 0418 01/04/19 2304 01/05/19 0324  WBC 8.1  8.5  --  9.9  --   --  9.6  --  13.3*  --  11.1*  CREATININE 2.12*   < > 2.12*   < > 1.58* 1.57* 1.58* 1.94*  --  2.01*  VANCORANDOM  --   --   --   --   --   --   --   --  18  --    < > = values in this interval not displayed.    Estimated Creatinine Clearance: 38.9 mL/min (A) (by C-G formula based on SCr of 2.01 mg/dL (H)).    No Known Allergies  Antimicrobials this admission: vancomycin 4/11 >>  cefepime 4/11 >>   Microbiology results: 4/7 MRSA PCR: positive 4/11 MRSA PCR negative?  Thank you for allowing pharmacy to be a part of this patient's care.  Tobie Lords, PharmD 01/05/2019 5:39 AM

## 2019-01-05 NOTE — Progress Notes (Signed)
[place] [facility] [place] NAME: Darius Taylor    MR#:  [id number]  DATE OF BIRTH:  1945/10/24  SUBJECTIVE:  CHIEF COMPLAINT:   Chief Complaint  Patient presents with  . Weakness    Patient is on high flow oxygen but very short of breath and using all his accessory muscles, could not breathe.  Requesting me to transfer him back to stepdown unit  REVIEW OF SYSTEMS:  Review of Systems  Constitutional: Negative for chills and fever.  HENT: Negative for hearing loss and tinnitus.   Eyes: Negative for blurred vision and double vision.  Respiratory: Negative for cough and shortness of breath.   Cardiovascular: Negative for chest pain and palpitations.  Gastrointestinal: Negative for heartburn and nausea.  Genitourinary: Negative for dysuria and urgency.  Musculoskeletal: Negative for neck pain.  Skin: Negative for itching and rash.  Neurological: Negative for dizziness.  Psychiatric/Behavioral: Negative for depression and hallucinations.    DRUG ALLERGIES:  No Known Allergies VITALS:  Blood pressure 131/72, pulse 87, temperature 99.3 F (37.4 C), temperature source Oral, resp. rate 20, height 5\' 11"  (1.803 m), weight 100.5 kg, SpO2 90 %. PHYSICAL EXAMINATION:  Physical Exam  Physical Exam -not done today .   LABORATORY PANEL:  Male CBC Recent Labs  Lab 01/05/19 0324  WBC 11.1*  HGB 8.7*  HCT 28.0*  PLT 161   ------------------------------------------------------------------------------------------------------------------ Chemistries  Recent Labs  Lab 01/01/19 0550  01/05/19 0324  NA 138   < > 137  K 4.4   < > 3.8  CL 101   < > 100  CO2 28   < > 29  GLUCOSE 104*   < > 153*  BUN 35*   < > 48*  CREATININE 1.58*   < > 2.01*  CALCIUM 9.0   < > 8.7*  MG 2.5*  --   --   AST  --   --  70*  ALT  --   --  83*  ALKPHOS  --   --  102  BILITOT  --   --  1.0   < > = values in this interval not displayed.   RADIOLOGY:  Dg  Chest Port 1 View  Result Date: 01/05/2019 CLINICAL DATA:  Respiratory failure. EXAM: PORTABLE CHEST 1 VIEW COMPARISON:  CT of the chest on 01/02/2019 and chest x-ray on 12/31/2018 FINDINGS: Stable cardiac enlargement, appearance of pacing/ICD device and appearance transcatheter aortic valve. Lungs show subtle opacities in the right upper lobe and both lung bases potentially representing pneumonia. There also may be a component of mild pulmonary interstitial edema and bibasilar atelectasis. No significant pleural effusion. No pneumothorax. IMPRESSION: Subtle opacities in the right upper lobe and both lung bases potentially representing multifocal pneumonia. There also may be a component of mild pulmonary interstitial edema and bibasilar atelectasis. Electronically Signed   By: Aletta Edouard M.D.   On: 01/05/2019 08:37   ASSESSMENT AND PLAN:   1.  Acute on chronic hypoxic respiratory failure Healthcare associated pneumonia and acute on chronic CHF diastolic Shortness of breath is getting worse.  Transfer to stepdown unit.  Dr. Patsey Berthold is aware ,we will try BiPAP Repeat chest x-ray with the multifocal pneumonia and pulmonary interstitial edema and atelectasis CT scan with new groundglass opacity and bilateral lower lobe opacity concerning for pneumonia Cefepime and vancomycin Flu test neg, respiratory panel  Neg  pending covid test, airborne precautions Patient uses 2 L of home oxygen therapy  at home.  Currently on  high flow oxygen   pulmonology following IV Lasix-one-time dose and hold as renal function is getting worse Monitor intake and output  2.  Acute kidney injury superimposed on chronic kidney disease stage III Initially due to recent alcohol use leading to dehydration.  Lasix one-time dose as renal function is progressively getting worse  Renal function is getting worse 1.58-1.94-2.01  chest x-ray done showed mild pulmonary vascular congestion. Marland Kitchen  Avoid nephrotoxic agents    3.   acute on chronic diastolic CHF Chest x-ray with mild vascular congestion without overt edema Hold Lasix after 1 dose of Lasix 40 mg IV today Wean down oxygen to baseline 2 L when clinically stable Echocardiogram 220 has revealed 50 to 55% ejection fraction with normal systolic function cardiology -[place] following   4.  CAD Stable.  5.  COPD Stable No continue nebs  6.  Paroxysmal atrial fibrillation Rate controlled Patient on anticoagulation with Eliquis  7. Alcohol intoxication Patient clinically stable this morning.  Alcohol level was 122.  Patient denies drinking alcohol on a daily basis. Monitor for any signs of alcohol withdrawal.  Continue thiamine, multivitamin and folic acid   8 generalized weakness Physical therapist evaluated patient and recommended home health with physical therapy.  9.  Decreased appetite dietitian consult  DVT prophylaxis; patient on Eliquis  Disposition; plans to discharge home tomorrow if respiratory status improves and remains stable  All the records are reviewed and case discussed with Care Management/Social Worker. Management plans discussed with the patient, discussed with the patient's daughter Arizona Constable over phone [phone] she is in agreement.  CODE STATUS: Full Code  Critical care time spent 41 minutes  More than 50% of the time was spent in counseling/coordination of care: YES  POSSIBLE D/C IN ? DAY, DEPENDING ON CLINICAL CONDITION.   Nicholes Mango M.D on 01/05/2019 at 2:58 PM  Between 7am to 6pm - Pager - [phone]  After 6pm go to [email or web address] - Technical brewer [place] [facility]  Office  [phone]  CC: Primary care physician; [employer]  Note: This dictation was prepared with Diplomatic Services operational officer dictation along with smaller Company secretary. Any transcriptional errors that result from this process are unintentional.

## 2019-01-05 NOTE — Progress Notes (Signed)
[facility] Encounter Note  Patient: Darius Taylor / Admit Date: 12/29/2018 / Date of Encounter: 01/05/2019, 2:47 PM   Subjective: Patient appears to have some improvements in cough congestion and a reduction in oxygen requirements over the last several days.  No evidence of chest discomfort or other cardiovascular signs or symptoms.  Patient has not had any atrial fibrillation on telemetry with atrial pacing.  Kidney function is stable.  Chest x-ray still suggesting possible interstitial edema and effusion  Review of Systems: Positive for: Shortness of breath Negative for: Vision change, hearing change, syncope, dizziness, nausea, vomiting,diarrhea, bloody stool, stomach pain, cough, congestion, diaphoresis, urinary frequency, urinary pain,skin lesions, skin rashes Others previously listed  Objective: Telemetry: Atrial pacing Physical Exam: Blood pressure 131/72, pulse 87, temperature 99.3 F (37.4 C), temperature source Oral, resp. rate 20, height 5\' 11"  (1.803 m), weight 100.5 kg, SpO2 90 %. Body mass index is 30.9 kg/m. As per prime doc previously noted  Intake/Output Summary (Last 24 hours) at 01/05/2019 1447 Last data filed at 01/05/2019 0508 Gross per 24 hour  Intake 809.51 ml  Output 350 ml  Net 459.51 ml    Inpatient Medications:  . aerochamber plus with mask  1 each Other Once  . amLODipine  10 mg Oral Daily  . apixaban  5 mg Oral BID  . aspirin  81 mg Oral Daily  . docusate sodium  100 mg Oral Daily  . feeding supplement (NEPRO CARB STEADY)  237 mL Oral Q24H  . folic acid  1 mg Oral Daily  . Ipratropium-Albuterol  1 puff Inhalation Q6H  . magnesium oxide  400 mg Oral BID  . multivitamin with minerals  1 tablet Oral Daily  . thiamine  100 mg Oral Daily   Infusions:  . sodium chloride Stopped (01/05/19 0318)  . ceFEPime (MAXIPIME) IV 2 g (01/05/19 0916)  . [START ON 01/06/2019] vancomycin      Labs: Recent Labs    01/04/19 0418  01/05/19 0324  NA 137 137  K 4.4 3.8  CL 98 100  CO2 29 29  GLUCOSE 128* 153*  BUN 43* 48*  CREATININE 1.94* 2.01*  CALCIUM 8.5* 8.7*   Recent Labs    01/05/19 0324  AST 70*  ALT 83*  ALKPHOS 102  BILITOT 1.0  PROT 6.8  ALBUMIN 2.8*   Recent Labs    01/04/19 0418 01/05/19 0324  WBC 13.3* 11.1*  NEUTROABS 11.8*  --   HGB 8.7* 8.7*  HCT 27.8* 28.0*  MCV 95.9 97.2  PLT 147* 161   No results for input(s): CKTOTAL, CKMB, TROPONINI in the last 72 hours. Invalid input(s): POCBNP No results for input(s): HGBA1C in the last 72 hours.   Weights: Filed Weights   01/03/19 1702 01/04/19 0113 01/04/19 2109  Weight: 97.5 kg 97.8 kg 100.5 kg     Radiology/Studies:  Dg Chest 1 View  Result Date: 12/31/2018 CLINICAL DATA:  SOB. EXAM: CHEST  1 VIEW COMPARISON:  12/30/2018. FINDINGS: Marked cardiac enlargement. Dual lead pacer/AICD. Mild vascular congestion. No overt edema. Chronic elevation LEFT hemidiaphragm. No pneumothorax. Aortic atherosclerosis. IMPRESSION: Cardiomegaly. Mild vascular congestion without overt edema. Stable appearance from priors. Electronically Signed   By: Staci Righter M.D.   On: 12/31/2018 08:20   Ct Chest Wo Contrast  Result Date: 01/02/2019 CLINICAL DATA:  Dyspnea.  History of colon cancer. EXAM: CT CHEST WITHOUT CONTRAST TECHNIQUE: Multidetector CT imaging of the chest was performed following the standard protocol without IV  contrast. COMPARISON:  Radiographs of December 31, 2018. CT scan of November 10, 2018. FINDINGS: Cardiovascular: Atherosclerosis of thoracic aorta is noted without aneurysm formation. Mild cardiomegaly is noted. Status post transcatheter aortic valve replacement. Coronary artery calcifications are noted. Mediastinum/Nodes: No enlarged mediastinal or axillary lymph nodes. Thyroid gland, trachea, and esophagus demonstrate no significant findings. Lungs/Pleura: No pneumothorax or pleural effusion is noted. Moderate left lower lobe subsegmental  atelectasis or pneumonia is noted which is increased compared to prior exam. Mild right posterior basilar subsegmental atelectasis or inflammation is noted posteriorly in right lower lobe. New ground-glass opacity seen posteriorly in the right upper lobe concerning for pneumonia. Upper Abdomen: No acute abnormality. Musculoskeletal: No chest wall mass or suspicious bone lesions identified. IMPRESSION: New ground-glass opacity seen posteriorly in right upper lobe concerning for pneumonia; this may represent typical or atypical infection, including viral etiology. Also noted are increased bilateral lower lobe opacities concerning for worsening atelectasis or pneumonia, left worse than right. Coronary artery calcifications are noted. Aortic Atherosclerosis (ICD10-I70.0). Electronically Signed   By: Marijo Conception, M.D.   On: 01/02/2019 16:18   Dg Chest Port 1 View  Result Date: 01/05/2019 CLINICAL DATA:  Respiratory failure. EXAM: PORTABLE CHEST 1 VIEW COMPARISON:  CT of the chest on 01/02/2019 and chest x-ray on 12/31/2018 FINDINGS: Stable cardiac enlargement, appearance of pacing/ICD device and appearance transcatheter aortic valve. Lungs show subtle opacities in the right upper lobe and both lung bases potentially representing pneumonia. There also may be a component of mild pulmonary interstitial edema and bibasilar atelectasis. No significant pleural effusion. No pneumothorax. IMPRESSION: Subtle opacities in the right upper lobe and both lung bases potentially representing multifocal pneumonia. There also may be a component of mild pulmonary interstitial edema and bibasilar atelectasis. Electronically Signed   By: Aletta Edouard M.D.   On: 01/05/2019 08:37   Dg Chest Port 1 View  Result Date: 12/30/2018 CLINICAL DATA:  Progressive shortness of breath. EXAM: PORTABLE CHEST 1 VIEW COMPARISON:  Radiograph yesterday. CT 11/10/2018 FINDINGS: Similar cardiomegaly. Unchanged mediastinal contours. Aortic  atherosclerosis with aortic root replacement. Left-sided pacemaker remains in place. Vascular congestion without overt pulmonary edema. Mild elevation of left hemidiaphragm. Improved atelectasis in the left mid lung. No large pleural effusion. No pneumothorax. Unchanged osseous structures. IMPRESSION: Cardiomegaly with vascular congestion. No overt pulmonary edema. Improved left midlung atelectasis. Aortic Atherosclerosis (ICD10-I70.0). Electronically Signed   By: Keith Rake M.D.   On: 12/30/2018 02:01   Dg Chest Portable 1 View  Result Date: 12/29/2018 CLINICAL DATA:  Weakness with apparent hypotension EXAM: PORTABLE CHEST 1 VIEW COMPARISON:  Chest radiograph November 05, 2018 and chest CT November 10, 2018 FINDINGS: There is atelectatic change in the left mid lung. There is no appreciable edema or consolidation. Heart is mildly enlarged with pulmonary vascularity normal. Pacemaker leads are attached the right atrium and right ventricle. There is aortic atherosclerosis. No adenopathy. No bone lesions. There is calcification in each carotid artery. IMPRESSION: Left midlung atelectasis. No edema or consolidation. Mild cardiomegaly. Pacemaker leads attached to right atrium and right ventricle. Aortic Atherosclerosis (ICD10-I70.0). Bilateral carotid artery calcification noted. Electronically Signed   By: Lowella Grip III M.D.   On: 12/29/2018 21:14     Assessment and Recommendation  73 y.o. male with acute on chronic diastolic dysfunction heart failure likely secondary to left ventricular hypertrophy and previous transarterial valvular replacement status post permanent pacemaker ICD with some pulmonary edema chronic kidney disease COPD but no current evidence of myocardial infarction  or atrial fibrillation at this time. 1.  Continue supportive care respiratory distress and infection 2.  Continue gentle diuresis for acute on chronic diastolic dysfunction heart failure as needed 3.  No further  cardiac diagnostics necessary at this time 4. 4.  No additional medication management for heart rate control of atrial fibrillation currently maintaining atrial pacing 5.  Anticoagulation for further risk reduction in stroke with atrial fibrillation 6.  Continue ambulation and follow for improvements of symptoms and further adjustments of medication management as necessary 7.  Call if further questions  Signed, Serafina Royals M.D. FACC

## 2019-01-05 NOTE — TOC Initial Note (Signed)
Transition of Care [facility]) - Initial/Assessment Note    Patient Details  Name: Darius Taylor MRN: [id number] Date of Birth: [birth year]  Transition of Care [facility]) CM/SW Contact:    Darius Rafter, RN Phone Number: 01/05/2019, 2:14 PM  Clinical Narrative:     Patient is from home alone.  Admitted with acute on chronic renal failure, weakness and low BP.  Currently transferring to the ICU for increased oxygen requirement; COVID negative.  Hx of COPD, CHF and Afib.  Called and spoke with Darius Taylor patient's daughter [phone].  He is current with [facility] for PCP and obtains medications there without difficulty.  He is independent from home.  Uses oxygen at home PRN.  His daughter or friend drive him to appointments.  Not currently open to the heart failure clinic.  He does have a functioning scale at home.  Open to [facility] for RN, PT will ask for SW, and aide to be added at DC.  [facility] aware of inpatient status.  Will notify [facility] [place].            Expected Discharge Plan: [place] Barriers to Discharge: Continued Medical Work up   Patient Goals and CMS Choice Patient states their goals for this hospitalization and ongoing recovery are:: daughter states to return home with current home health services.   CMS Medicare.gov Compare Post Acute Care list provided to:: Patient Represenative (must comment)( Taylor,Darius Daughter [phone] ) Choice offered to / list presented to : Adult Children  Expected Discharge Plan and Services Expected Discharge Plan: [place]   Discharge Planning Services: CM Consult Post Acute Care Choice: [place] arrangements for the past 2 months: Single Family Home                     HH Arranged: RN, PT, Nurse's Aide, Social Work [employer] Agency: [employer] (now [facility] [place] Arrangements/Services Living arrangements for the past 2 months: [place] Lives with:: Self              Current home services: Home PT, Home RN Criminal Activity/Legal Involvement Pertinent to Current Situation/Hospitalization: No - Comment as needed  Activities of Daily Living Home Assistive Devices/Equipment: None ADL Screening (condition at time of admission) Patient's cognitive ability adequate to safely complete daily activities?: Yes Is the patient deaf or have difficulty hearing?: No Does the patient have difficulty seeing, even when wearing glasses/contacts?: No Does the patient have difficulty concentrating, remembering, or making decisions?: No Patient able to express need for assistance with ADLs?: Yes Does the patient have difficulty dressing or bathing?: No Independently performs ADLs?: Yes (appropriate for developmental age) Does the patient have difficulty walking or climbing stairs?: No Weakness of Legs: None Weakness of Arms/Hands: None  Permission Sought/Granted Permission sought to share information with : Chartered certified accountant granted to share information with : Yes, Verbal Permission Granted     Permission granted to share info w AGENCY: [facility]        Emotional Assessment       Orientation: : Oriented to Self, Oriented to Place, Oriented to Situation, Oriented to  Time Alcohol / Substance Use: Not Applicable    Admission diagnosis:  Weakness [R53.1] SOB (shortness of breath) [R06.02] Patient Active Problem List   Diagnosis Date Noted  . CAD (coronary artery disease) 12/30/2018  . Acute on chronic renal  failure ([place]) 12/30/2018  . Alcoholic intoxication without complication ([place]) 03/23/1600  . CHF exacerbation ([place]) 11/03/2018  . Chronic diastolic heart failure ([place]) 09/24/2018  . HTN (hypertension) 09/24/2018  . Acute on chronic respiratory failure with hypoxemia ([place]) 08/30/2018  . History of colon cancer   . COPD (chronic obstructive pulmonary disease) ([place]) 01/08/2018  .  Cellulitis 11/18/2017  . Hematoma    PCP:  [facility], [place]:   [place], [place] [address] [place] [address] Phone: [phone] Fax: [phone]     Social Determinants of Health (SDOH) Interventions    Readmission Risk Interventions Readmission Risk Prevention Plan 01/05/2019  Transportation Screening Complete  Medication Review Press photographer) Complete  PCP or Specialist appointment within 3-5 days of discharge Complete  HRI or [place] Complete  SW Recovery Care/Counseling Consult Patient refused  Palliative Care Screening Not [place] Not Applicable  Some recent data might be hidden

## 2019-01-05 NOTE — Plan of Care (Signed)
  Problem: Health Behavior/Discharge Planning: Goal: Ability to manage health-related needs will improve Outcome: Not Progressing Note:  Patient has gone from 6 L [place] to 10 L [facility]NC this AM. Pulse ox has gone from about 83% on the prior to now being mostly 90%. Will continue to monitor respiratory status. Have already discussed with the RT. [name]

## 2019-01-05 NOTE — Plan of Care (Signed)
  Problem: Activity: Goal: Risk for activity intolerance will decrease Outcome: Progressing Note:  Up to [facility] with 1 assist   Problem: Coping: Goal: Level of anxiety will decrease Outcome: Progressing   Problem: Elimination: Goal: Will not experience complications related to bowel motility Outcome: Progressing Note:  BM this shift 01/05/2019 Goal: Will not experience complications related to urinary retention Outcome: Progressing   Problem: Pain Managment: Goal: General experience of comfort will improve Outcome: Progressing Note:  No complaints of pain this shift   Problem: Safety: Goal: Ability to remain free from injury will improve Outcome: Progressing   Problem: Skin Integrity: Goal: Risk for impaired skin integrity will decrease Outcome: Progressing   Problem: Clinical Measurements: Goal: Respiratory complications will improve Outcome: Not Progressing Note:  Bumped up to 6L O2 nasal cannula, placed on continuous pulse oximetry. Satting in the 90's now. Desats very quickly

## 2019-01-05 NOTE — Progress Notes (Signed)
Follow up - Critical Care Medicine Note  Patient Details:    Darius Taylor is an 73 y.o. male. 73 year old male transferred out of the stepdown/ICU yesterday returns today because of increasing dyspnea and worsening hypoxia despite supplemental oxygen.  Patient will require noninvasive ventilation.  Patient is also developed significant fever.  Currently on vancomycin and cefepime.  Recent HCAP February 2020.  Taking bruit treatments  Past medical history, surgical history, family history and social history were reviewed.  Lines, Airways, Drains:    Anti-infectives:  Anti-infectives (From admission, onward)   Start     Dose/Rate Route Frequency Ordered Stop   01/06/19 0000  vancomycin (VANCOCIN) 1,000 mg in sodium chloride 0.9 % 250 mL IVPB  Status:  Discontinued     1,000 mg 250 mL/hr over 60 Minutes Intravenous Every 24 hours 01/05/19 0538 01/05/19 0852   01/06/19 0000  vancomycin (VANCOCIN) IVPB 1000 mg/200 mL premix     1,000 mg 200 mL/hr over 60 Minutes Intravenous Every 24 hours 01/05/19 0852     01/04/19 0000  vancomycin (VANCOCIN) 1,250 mg in sodium chloride 0.9 % 250 mL IVPB  Status:  Discontinued     1,250 mg 166.7 mL/hr over 90 Minutes Intravenous Every 24 hours 01/03/19 0953 01/05/19 0538   01/03/19 1200  vancomycin (VANCOCIN) 2,000 mg in sodium chloride 0.9 % 500 mL IVPB     2,000 mg 250 mL/hr over 120 Minutes Intravenous  Once 01/03/19 0953 01/03/19 2114   01/03/19 1100  ceFEPIme (MAXIPIME) 2 g in sodium chloride 0.9 % 100 mL IVPB     2 g 200 mL/hr over 30 Minutes Intravenous Every 12 hours 01/03/19 8099        Microbiology: Results for orders placed or performed during the hospital encounter of 12/29/18  MRSA PCR Screening     Status: Abnormal   Collection Time: 12/30/18  4:53 AM  Result Value Ref Range Status   MRSA by PCR POSITIVE (A) NEGATIVE Final    Comment:        The GeneXpert MRSA Assay (FDA approved for NASAL specimens only), is one component of  a comprehensive MRSA colonization surveillance program. It is not intended to diagnose MRSA infection nor to guide or monitor treatment for MRSA infections. RESULT CALLED TO, READ BACK BY AND VERIFIED WITH: WAINETTE MILES @0808  12/30/2018 MU Performed at [facility], [address] [place], [place] [address]   Respiratory Panel by PCR     Status: None   Collection Time: 01/03/19  9:01 AM  Result Value Ref Range Status   Adenovirus NOT DETECTED NOT DETECTED Final   Coronavirus 229E NOT DETECTED NOT DETECTED Final    Comment: (NOTE) The Coronavirus on the Respiratory Panel, DOES NOT test for the novel  Coronavirus (2019 nCoV)    Coronavirus HKU1 NOT DETECTED NOT DETECTED Final   Coronavirus NL63 NOT DETECTED NOT DETECTED Final   Coronavirus OC43 NOT DETECTED NOT DETECTED Final   Metapneumovirus NOT DETECTED NOT DETECTED Final   Rhinovirus / Enterovirus NOT DETECTED NOT DETECTED Final   Influenza A NOT DETECTED NOT DETECTED Final   Influenza B NOT DETECTED NOT DETECTED Final   Parainfluenza Virus 1 NOT DETECTED NOT DETECTED Final   Parainfluenza Virus 2 NOT DETECTED NOT DETECTED Final   Parainfluenza Virus 3 NOT DETECTED NOT DETECTED Final   Parainfluenza Virus 4 NOT DETECTED NOT DETECTED Final   Respiratory Syncytial Virus NOT DETECTED NOT DETECTED Final   Bordetella pertussis NOT DETECTED NOT  DETECTED Final   Chlamydophila pneumoniae NOT DETECTED NOT DETECTED Final   Mycoplasma pneumoniae NOT DETECTED NOT DETECTED Final    Comment: Performed at [facility], [place] [address], [place], [place] [address]  Novel Coronavirus, NAA (hospital order; send-out to ref lab)     Status: None   Collection Time: 01/03/19  9:02 AM  Result Value Ref Range Status   SARS-CoV-2, NAA NOT DETECTED NOT DETECTED Final    Comment: Performed at [facility] Performed at [facility], [address], [place], [place] [address]    Coronavirus Source NASOPHARYNGEAL  Final     Comment: Performed at [facility], [address] [place], [place] [address]  MRSA PCR Screening     Status: None   Collection Time: 01/03/19  9:19 PM  Result Value Ref Range Status   MRSA by PCR NEGATIVE NEGATIVE Final    Comment:        The GeneXpert MRSA Assay (FDA approved for NASAL specimens only), is one component of a comprehensive MRSA colonization surveillance program. It is not intended to diagnose MRSA infection nor to guide or monitor treatment for MRSA infections. Performed at [facility], [name], [place],  [address]     Best Practice/Protocols:  VTE Prophylaxis: Direct Thrombin Inhibitor   Events:   Studies: Dg Chest 1 View  Result Date: 12/31/2018 CLINICAL DATA:  SOB. EXAM: CHEST  1 VIEW COMPARISON:  12/30/2018. FINDINGS: Marked cardiac enlargement. Dual lead pacer/AICD. Mild vascular congestion. No overt edema. Chronic elevation LEFT hemidiaphragm. No pneumothorax. Aortic atherosclerosis. IMPRESSION: Cardiomegaly. Mild vascular congestion without overt edema. Stable appearance from priors. Electronically Signed   By: Staci Righter M.D.   On: 12/31/2018 08:20   Dg Abd 1 View  Result Date: 01/05/2019 CLINICAL DATA:  Nausea and vomiting EXAM: ABDOMEN - 1 VIEW COMPARISON:  09/28/2016 FINDINGS: There is a large amount of stool in the ascending and transverse colon. The bowel gas pattern is normal. No radio-opaque calculi or other significant radiographic abnormality are seen. Mild osteoarthritis of the right hip. IMPRESSION: Large amount of stool in the ascending and transverse colon. Electronically Signed   By: Kathreen Devoid   On: 01/05/2019 19:42   Ct Chest Wo Contrast  Result Date: 01/02/2019 CLINICAL DATA:  Dyspnea.  History of colon cancer. EXAM: CT CHEST WITHOUT CONTRAST TECHNIQUE: Multidetector CT imaging of the chest was performed following the standard protocol without IV contrast. COMPARISON:  Radiographs of December 31, 2018.  CT scan of November 10, 2018. FINDINGS: Cardiovascular: Atherosclerosis of thoracic aorta is noted without aneurysm formation. Mild cardiomegaly is noted. Status post transcatheter aortic valve replacement. Coronary artery calcifications are noted. Mediastinum/Nodes: No enlarged mediastinal or axillary lymph nodes. Thyroid gland, trachea, and esophagus demonstrate no significant findings. Lungs/Pleura: No pneumothorax or pleural effusion is noted. Moderate left lower lobe subsegmental atelectasis or pneumonia is noted which is increased compared to prior exam. Mild right posterior basilar subsegmental atelectasis or inflammation is noted posteriorly in right lower lobe. New ground-glass opacity seen posteriorly in the right upper lobe concerning for pneumonia. Upper Abdomen: No acute abnormality. Musculoskeletal: No chest wall mass or suspicious bone lesions identified. IMPRESSION: New ground-glass opacity seen posteriorly in right upper lobe concerning for pneumonia; this may represent typical or atypical infection, including viral etiology. Also noted are increased bilateral lower lobe opacities concerning for worsening atelectasis or pneumonia, left worse than right. Coronary artery calcifications are noted. Aortic Atherosclerosis (ICD10-I70.0). Electronically Signed  By: Marijo Conception, M.D.   On: 01/02/2019 16:18   Dg Chest Port 1 View  Result Date: 01/05/2019 CLINICAL DATA:  Respiratory failure. EXAM: PORTABLE CHEST 1 VIEW COMPARISON:  CT of the chest on 01/02/2019 and chest x-ray on 12/31/2018 FINDINGS: Stable cardiac enlargement, appearance of pacing/ICD device and appearance transcatheter aortic valve. Lungs show subtle opacities in the right upper lobe and both lung bases potentially representing pneumonia. There also may be a component of mild pulmonary interstitial edema and bibasilar atelectasis. No significant pleural effusion. No pneumothorax. IMPRESSION: Subtle opacities in the right upper lobe  and both lung bases potentially representing multifocal pneumonia. There also may be a component of mild pulmonary interstitial edema and bibasilar atelectasis. Electronically Signed   By: Aletta Edouard M.D.   On: 01/05/2019 08:37   Dg Chest Port 1 View  Result Date: 12/30/2018 CLINICAL DATA:  Progressive shortness of breath. EXAM: PORTABLE CHEST 1 VIEW COMPARISON:  Radiograph yesterday. CT 11/10/2018 FINDINGS: Similar cardiomegaly. Unchanged mediastinal contours. Aortic atherosclerosis with aortic root replacement. Left-sided pacemaker remains in place. Vascular congestion without overt pulmonary edema. Mild elevation of left hemidiaphragm. Improved atelectasis in the left mid lung. No large pleural effusion. No pneumothorax. Unchanged osseous structures. IMPRESSION: Cardiomegaly with vascular congestion. No overt pulmonary edema. Improved left midlung atelectasis. Aortic Atherosclerosis (ICD10-I70.0). Electronically Signed   By: Keith Rake M.D.   On: 12/30/2018 02:01   Dg Chest Portable 1 View  Result Date: 12/29/2018 CLINICAL DATA:  Weakness with apparent hypotension EXAM: PORTABLE CHEST 1 VIEW COMPARISON:  Chest radiograph November 05, 2018 and chest CT November 10, 2018 FINDINGS: There is atelectatic change in the left mid lung. There is no appreciable edema or consolidation. Heart is mildly enlarged with pulmonary vascularity normal. Pacemaker leads are attached the right atrium and right ventricle. There is aortic atherosclerosis. No adenopathy. No bone lesions. There is calcification in each carotid artery. IMPRESSION: Left midlung atelectasis. No edema or consolidation. Mild cardiomegaly. Pacemaker leads attached to right atrium and right ventricle. Aortic Atherosclerosis (ICD10-I70.0). Bilateral carotid artery calcification noted. Electronically Signed   By: Lowella Grip III M.D.   On: 12/29/2018 21:14    Consults: Treatment Team:  Ottie Glazier, MD Corey Skains, MD [facility],  [facility], MD   Subjective:    Overnight Issues: Patient continues to have issues with significant dyspnea.  Notes issues with constipation.  Cough has been unchanged.  No hemoptysis.  Objective:  Vital signs for last 24 hours: Temp:  [98.9 F (37.2 C)-100.2 F (37.9 C)] 98.9 F (37.2 C) (04/13 1900) Pulse Rate:  [79-87] 79 (04/13 1900) Resp:  [18-24] 19 (04/13 1900) BP: (96-131)/(64-85) 96/64 (04/13 1900) SpO2:  [82 %-100 %] 98 % (04/13 1900) FiO2 (%):  [50 %] 50 % (04/13 1900)  Hemodynamic parameters for last 24 hours:    Intake/Output from previous day: 04/12 0701 - 04/13 0700 In: 1309.5 [P.O.:500; I.V.:27.5; IV Piggyback:782] Out: 450 [Urine:450]  Intake/Output this shift: Total I/O In: -  Out: 200 [Urine:200]  Vent settings for last 24 hours: FiO2 (%):  [50 %] 50 %  Physical Exam:  Gen: NAD, on BiPAP, comfortable,sallow complexion HEENT: NCAT, sclerae anicteric Neck: JVP not visualized, trachea midline Lungs:  Coarse breath sounds throughout, crackles at bases, no wheezes. Cardiovascular: RRR, no murmurs, AICD Abdomen: Protuberant, somewhat firm, no tenderness, cannot assess organomegaly. Ext: without clubbing, cyanosis, edema Neuro: grossly intact Skin: dry,warm  Assessment/Plan:   1.  Acute hypoxic respiratory failure with worsening O2  requirements.  He is improving with BiPAP.  Continue the same.  Supplemental O2 bled in to keep saturations at 90% or better.  He has evidence of diastolic dysfunction and right-sided failure by echo performed previously.  This may represent decompensation of chronic biventricular failure.  He has acute on chronic kidney injury making diuresis precarious.  Cannot exclude element of pneumonia.  2.  Fever with potential pneumonia, continue cefepime and vancomycin.  His fever has been poorly controlled. Acetaminophen as needed. Obtain blood cultures.  Concern is of potential infection to AICD as the patient had an infectious  process noted in February and again now.  The patient has tested negative for COVID-19.  Other considerations could be endocarditis.  Consider TEE.  3.  Acute on chronic kidney injury with volume overload, continue to monitor. Diuretics held in the setting of worsening function.  Check renal panel in the morning.  If no improvement recommend evaluation by nephrology.  4.  Anemia, no evidence of bleeding, appears to be anemia of chronic disease, check iron studies B12 and folate.  This adds complexity to his management.  5.  History of colon cancer, check CEA  6.  Constipation, laxation.    LOS: 3 days   Additional comments: Discussed with patient's bedside nurse.    Renold Don, MD [facility] 01/05/2019  This chart was dictated using voice recognition software/Dragon.  Despite best efforts to proofread, errors can occur which can change the meaning.  Any change was purely unintentional..

## 2019-01-05 NOTE — Progress Notes (Signed)
Called and gave report to the ICU nurse at this time. All questions answered. Will tube any available medications. Will transport momentarily. [name] [facility]

## 2019-01-06 ENCOUNTER — Inpatient Hospital Stay: Payer: Medicare HMO

## 2019-01-06 LAB — CBC WITH DIFFERENTIAL/PLATELET
Abs Immature Granulocytes: 0.11 10*3/uL — ABNORMAL HIGH (ref 0.00–0.07)
Basophils Absolute: 0 10*3/uL (ref 0.0–0.1)
Basophils Relative: 0 %
Eosinophils Absolute: 0.3 10*3/uL (ref 0.0–0.5)
Eosinophils Relative: 3 %
HCT: 26.9 % — ABNORMAL LOW (ref 39.0–52.0)
Hemoglobin: 8.3 g/dL — ABNORMAL LOW (ref 13.0–17.0)
Immature Granulocytes: 1 %
Lymphocytes Relative: 5 %
Lymphs Abs: 0.5 10*3/uL — ABNORMAL LOW (ref 0.7–4.0)
MCH: 30.5 pg (ref 26.0–34.0)
MCHC: 30.9 g/dL (ref 30.0–36.0)
MCV: 98.9 fL (ref 80.0–100.0)
Monocytes Absolute: 1 10*3/uL (ref 0.1–1.0)
Monocytes Relative: 10 %
Neutro Abs: 8.1 10*3/uL — ABNORMAL HIGH (ref 1.7–7.7)
Neutrophils Relative %: 81 %
Platelets: 188 10*3/uL (ref 150–400)
RBC: 2.72 MIL/uL — ABNORMAL LOW (ref 4.22–5.81)
RDW: 16.2 % — ABNORMAL HIGH (ref 11.5–15.5)
WBC: 10 10*3/uL (ref 4.0–10.5)
nRBC: 0 % (ref 0.0–0.2)

## 2019-01-06 LAB — RENAL FUNCTION PANEL
Albumin: 2.7 g/dL — ABNORMAL LOW (ref 3.5–5.0)
Anion gap: 9 (ref 5–15)
BUN: 55 mg/dL — ABNORMAL HIGH (ref 8–23)
CO2: 29 mmol/L (ref 22–32)
Calcium: 9.2 mg/dL (ref 8.9–10.3)
Chloride: 100 mmol/L (ref 98–111)
Creatinine, Ser: 1.82 mg/dL — ABNORMAL HIGH (ref 0.61–1.24)
GFR calc Af Amer: 41 mL/min — ABNORMAL LOW (ref >60)
GFR calc non Af Amer: 36 mL/min — ABNORMAL LOW (ref >60)
Glucose, Bld: 129 mg/dL — ABNORMAL HIGH (ref 70–99)
Phosphorus: 3.5 mg/dL (ref 2.5–4.6)
Potassium: 3.8 mmol/L (ref 3.5–5.1)
Sodium: 138 mmol/L (ref 135–145)

## 2019-01-06 LAB — MAGNESIUM: Magnesium: 2.7 mg/dL — ABNORMAL HIGH (ref 1.7–2.4)

## 2019-01-06 LAB — FOLATE: Folate: 14 ng/mL (ref >5.9)

## 2019-01-06 LAB — VITAMIN B12: Vitamin B-12: 179 pg/mL — ABNORMAL LOW (ref 180–914)

## 2019-01-06 LAB — FERRITIN: Ferritin: 449 ng/mL — ABNORMAL HIGH (ref 24–336)

## 2019-01-06 LAB — PROCALCITONIN: Procalcitonin: 28.82 ng/mL

## 2019-01-06 MED ORDER — VANCOMYCIN HCL IN DEXTROSE 1-5 GM/200ML-% IV SOLN
1000.0000 mg | INTRAVENOUS | Status: DC
  Administered 2019-01-06 – 2019-01-08 (×3): 1000 mg via INTRAVENOUS
  Filled 2019-01-06 (×4): qty 200

## 2019-01-06 NOTE — Progress Notes (Signed)
[facility] [place] NAME: Darius Taylor    MR#:  [id number]  DATE OF BIRTH:  1946-05-27  SUBJECTIVE:   Patient taken off BiPAP this morning.  REVIEW OF SYSTEMS:    Review of Systems  Constitutional: Negative for fever, chills weight loss HENT: Negative for ear pain, nosebleeds, congestion, facial swelling, rhinorrhea, neck pain, neck stiffness and ear discharge.   Respiratory: ++ for cough, shortness of breath, no wheezing  Cardiovascular: Negative for chest pain, palpitations and leg swelling.  Gastrointestinal: Negative for heartburn, abdominal pain, vomiting, diarrhea or consitpation Genitourinary: Negative for dysuria, urgency, frequency, hematuria Musculoskeletal: Negative for back pain or joint pain Neurological: Negative for dizziness, seizures, syncope, focal weakness,  numbness and headaches.  Hematological: Does not bruise/bleed easily.  Psychiatric/Behavioral: Negative for hallucinations, confusion, dysphoric mood    Tolerating Diet: yes      DRUG ALLERGIES:  No Known Allergies  VITALS:  Blood pressure 127/75, pulse 78, temperature 98.8 F (37.1 C), temperature source Oral, resp. rate 18, height 5\' 11"  (1.803 m), weight 99.5 kg, SpO2 98 %.  PHYSICAL EXAMINATION:  Constitutional: Appears well-developed and well-nourished. No distress. HENT: Normocephalic. Marland Kitchen Oropharynx is clear and moist.  Eyes: Conjunctivae and EOM are normal. PERRLA, no scleral icterus.  Neck: Normal ROM. Neck supple. No JVD. No tracheal deviation. CVS: RRR, S1/S2 +, 3/6  murmurs, no gallops, no carotid bruit.  Pulmonary: Bilateral rhonchi  abdominal: Soft. BS +,  no distension, tenderness, rebound or guarding.  Musculoskeletal: Normal range of motion. No edema and no tenderness.  Neuro: Alert. CN 2-12 grossly intact. No focal deficits. Skin: Skin is warm and dry. No rash noted. Psychiatric: Normal mood and affect.      LABORATORY PANEL:    CBC Recent Labs  Lab 01/06/19 0446  WBC 10.0  HGB 8.3*  HCT 26.9*  PLT 188   ------------------------------------------------------------------------------------------------------------------  Chemistries  Recent Labs  Lab 01/05/19 0324  01/06/19 0446  NA 137   < > 138  K 3.8   < > 3.8  CL 100   < > 100  CO2 29   < > 29  GLUCOSE 153*   < > 129*  BUN 48*   < > 55*  CREATININE 2.01*   < > 1.82*  CALCIUM 8.7*   < > 9.2  MG  --   --  2.7*  AST 70*  --   --   ALT 83*  --   --   ALKPHOS 102  --   --   BILITOT 1.0  --   --    < > = values in this interval not displayed.   ------------------------------------------------------------------------------------------------------------------  Cardiac Enzymes No results for input(s): TROPONINI in the last 168 hours. ------------------------------------------------------------------------------------------------------------------  RADIOLOGY:  Dg Abd 1 View  Result Date: 01/05/2019 CLINICAL DATA:  Nausea and vomiting EXAM: ABDOMEN - 1 VIEW COMPARISON:  09/28/2016 FINDINGS: There is a large amount of stool in the ascending and transverse colon. The bowel gas pattern is normal. No radio-opaque calculi or other significant radiographic abnormality are seen. Mild osteoarthritis of the right hip. IMPRESSION: Large amount of stool in the ascending and transverse colon. Electronically Signed   By: Kathreen Devoid   On: 01/05/2019 19:42   Dg Chest Port 1 View  Result Date: 01/06/2019 CLINICAL DATA:  Respiratory failure. EXAM: PORTABLE CHEST 1 VIEW COMPARISON:  Chest x-ray from yesterday. FINDINGS: Unchanged left chest wall AICD. Stable cardiomegaly status post TAVR. Atherosclerotic  calcification of the aortic arch. Unchanged pulmonary vascular congestion and interstitial thickening. Increased patchy opacity in the right upper lobe. Unchanged left greater than right bibasilar atelectasis. No large pleural effusion. No pneumothorax. No acute  osseous abnormality. IMPRESSION: 1. Increasing patchy opacity in the right upper lobe, concerning for pneumonia. 2. Unchanged mild pulmonary interstitial edema and bibasilar atelectasis. Electronically Signed   By: Titus Dubin M.D.   On: 01/06/2019 11:14   Dg Chest Port 1 View  Result Date: 01/05/2019 CLINICAL DATA:  Respiratory failure. EXAM: PORTABLE CHEST 1 VIEW COMPARISON:  CT of the chest on 01/02/2019 and chest x-ray on 12/31/2018 FINDINGS: Stable cardiac enlargement, appearance of pacing/ICD device and appearance transcatheter aortic valve. Lungs show subtle opacities in the right upper lobe and both lung bases potentially representing pneumonia. There also may be a component of mild pulmonary interstitial edema and bibasilar atelectasis. No significant pleural effusion. No pneumothorax. IMPRESSION: Subtle opacities in the right upper lobe and both lung bases potentially representing multifocal pneumonia. There also may be a component of mild pulmonary interstitial edema and bibasilar atelectasis. Electronically Signed   By: Aletta Edouard M.D.   On: 01/05/2019 08:37     ASSESSMENT AND PLAN:   73 year old male with a history of PAF, EtOH abuse and COPD who presented to the emergency room due to generalized weakness and found to have elevated creatinine.   1.  Acute on chronic kidney disease stage III due to EtOH abuse and dehydration: Creatinine has improved and is stable.  2.  Acute on chronic hypoxic respiratory failure: Patient was transferred to ICU and placed on BiPAP and then transferred out of ICU however back to stepdown unit yesterday due to increasing shortness of breath and hypoxia. Acute hypoxic respiratory failure with worsening O2 requirements due to right upper lobe hospital-acquired pneumonia and probable acute on chronic diastolic heart failure.  Patient is on cefepime and vancomycin. Patient has tested negative for COVID-19.   3.  Acute on chronic diastolic heart  failure: Patient would benefit from Lasix 40 IV daily with monitoring of intake and output with daily weight.  4.  Acute on chronic kidney disease stage III: Creatinine has improved Hold nephrotoxic medications and follow creatinine  5.  PAF: Patient currently in sinus rhythm Continue Eliquis  6.  COPD without signs of exacerbation: Continue nebs  7.  Essential hypertension: Continue Norvasc Management plans discussed with the patient and he is in agreement.  CODE STATUS: full  TOTAL TIME TAKING CARE OF THIS PATIENT: 28 minutes.     POSSIBLE D/C 2-4 days, DEPENDING ON CLINICAL CONDITION.   Bettey Costa M.D on 01/06/2019 at 12:12 PM  Between 7am to 6pm - Pager - [phone] After 6pm go to [email or web address] - password [email or web address] [facility]  Office  [phone]  CC: Primary care physician; [employer]  Note: This dictation was prepared with Diplomatic Services operational officer dictation along with smaller Company secretary. Any transcriptional errors that result from this process are unintentional.

## 2019-01-06 NOTE — Consult Note (Signed)
Pharmacy Antibiotic Note  Darius Taylor is a 73 y.o. male admitted on 12/29/2018 with a CHF exacerbation now with suspected pneumonia on CXR.  Pharmacy has been consulted for vancomycin and cefepime dosing. SCr appears to be approximately at baseline.  Plan: D4 of antibiotics. Per CCM discussion on 4/14, continue with current dose of Vancomycin 1g IV every 24 hours. Will continue to monitor renal function and adjust dose as needed.    Height: 5\' 11"  (180.3 cm) Weight: 219 lb 5.7 oz (99.5 kg) IBW/kg (Calculated) : 75.3  Temp (24hrs), Avg:99.1 F (37.3 C), Min:98.4 F (36.9 C), Max:100.2 F (37.9 C)  Recent Labs  Lab 01/02/19 0828 01/03/19 0410 01/04/19 0418 01/04/19 2304 01/05/19 0324 01/05/19 2256 01/06/19 0446  WBC 9.6  --  13.3*  --  11.1*  --  10.0  CREATININE 1.57* 1.58* 1.94*  --  2.01* 1.92* 1.82*  VANCORANDOM  --   --   --  18  --  16  --     Estimated Creatinine Clearance: 42.8 mL/min (A) (by C-G formula based on SCr of 1.82 mg/dL (H)).    No Known Allergies  Antimicrobials this admission: vancomycin 4/11 >>  cefepime 4/11 >>   Microbiology results: 4/13 BCx NGTD 4/11 COVID (-) 4/7 MRSA PCR: positive 4/11 MRSA PCR negative?  Thank you for allowing pharmacy to be a part of this patient's care.  Paticia Stack, PharmD Pharmacy Resident  01/06/2019 1:56 PM

## 2019-01-06 NOTE — Progress Notes (Signed)
CRITICAL CARE NOTE       SUBJECTIVE FINDINGS & SIGNIFICANT EVENTS   Resting in bed , mild discomfort due to SOB but overall improved  PAST MEDICAL HISTORY   Past Medical History:  Diagnosis Date  . Aortic stenosis   . Arrhythmia    atrial fibrillation  . Asthma   . Atrial fibrillation ([place])   . CAD (coronary artery disease)   . Cancer [facility])    colon, bladder and kidney  . CHF (congestive heart failure) ([place])   . 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 COPD (chronic obstructive pulmonary disease) ([place])   . H/O ventricular tachycardia   . Hypertension   . Nonischemic cardiomyopathy ([place])    Status post AICD and pacemaker  . Renal cell carcinoma ([place])      SURGICAL HISTORY   Past Surgical History:  Procedure Laterality Date  . CARDIAC DEFIBRILLATOR PLACEMENT    . CARDIAC DEFIBRILLATOR PLACEMENT    . COLONOSCOPY WITH PROPOFOL N/A 06/10/2018   Procedure: COLONOSCOPY WITH PROPOFOL;  Surgeon: Lin Landsman, MD;  Location: [facility] ENDOSCOPY;  Service: Gastroenterology;  Laterality: N/A;  . CORONARY ANGIOPLASTY  09/25/2016  . IRRIGATION AND DEBRIDEMENT HEMATOMA Left 10/24/2016   Procedure: IRRIGATION AND DEBRIDEMENT HEMATOMA;  Surgeon: Florene Glen, MD;  Location: [facility] ORS;  Service: General;  Laterality: Left;  . KIDNEY SURGERY     left ne[hrectomy for RCC  . PACEMAKER IMPLANT    . PARTIAL COLECTOMY       FAMILY HISTORY   Family History  Problem Relation Age of Onset  . Hypertension Mother   . Stroke Father      SOCIAL HISTORY   Social History   Tobacco Use  . Smoking status: Former Research scientist (life sciences)  . Smokeless tobacco: Never Used  Substance Use Topics  . Alcohol use: No  . Drug use: No     MEDICATIONS   Current Medication:  Current Facility-Administered Medications:  .  0.9 %  sodium  chloride infusion, , Intravenous, PRN, Nicholes Mango, MD, Stopped at 01/05/19 0318 .  acetaminophen (TYLENOL) tablet 650 mg, 650 mg, Oral, Q6H PRN, 650 mg at 01/03/19 1556 **OR** acetaminophen (TYLENOL) suppository 650 mg, 650 mg, Rectal, Q6H PRN, Lance Coon, MD .  aerochamber plus with mask device 1 each, 1 each, Other, Once, Nicholes Mango, MD, Stopped at 01/05/19 1349 .  albuterol (PROVENTIL HFA;VENTOLIN HFA) 108 (90 Base) MCG/ACT inhaler 1-2 puff, 1-2 puff, Inhalation, Q4H PRN, Gouru, Aruna, MD, 2 puff at 01/05/19 1403 .  amLODipine (NORVASC) tablet 10 mg, 10 mg, Oral, Daily, Lance Coon, MD, 10 mg at 01/06/19 0903 .  apixaban (ELIQUIS) tablet 5 mg, 5 mg, Oral, BID, Lance Coon, MD, 5 mg at 01/06/19 0902 .  aspirin chewable tablet 81 mg, 81 mg, Oral, Daily, Lance Coon, MD, 81 mg at 01/06/19 0903 .  ceFEPIme (MAXIPIME) 2 g in sodium chloride 0.9 % 100 mL IVPB, 2 g, Intravenous, Q12H, Dallie Piles, RPH, Stopped at 01/06/19 1000 .  docusate sodium (COLACE) capsule 100 mg, 100 mg, Oral, Daily, Fritzi Mandes, MD, 100 mg at 01/06/19 0902 .  feeding supplement (NEPRO CARB STEADY) liquid 237 mL, 237 mL, Oral, Q24H, Gouru, Aruna, MD, 237 mL at 01/06/19 1204 .  folic acid (FOLVITE) tablet 1 mg, 1 mg, Oral, Daily, Lance Coon, MD, 1 mg at 01/06/19 0902 .  guaiFENesin-dextromethorphan (ROBITUSSIN DM) 100-10 MG/5ML syrup 5 mL, 5 mL, Oral, Q4H PRN, Gouru, Aruna, MD, 5 mL at 01/02/19 0618 .  Ipratropium-Albuterol (COMBIVENT) respimat 1 puff, 1 puff, Inhalation, Q6H, Gouru, Aruna, MD, 1 puff at 01/06/19 1653 .  magnesium oxide (MAG-OX) tablet 400 mg, 400 mg, Oral, BID, Lance Coon, MD, 400 mg at 01/06/19 0903 .  multivitamin with minerals tablet 1 tablet, 1 tablet, Oral, Daily, Lance Coon, MD, 1 tablet at 01/06/19 0902 .  [DISCONTINUED] ondansetron (ZOFRAN) tablet 4 mg, 4 mg, Oral, Q6H PRN, 4 mg at 01/03/19 1407 **OR** ondansetron (ZOFRAN) injection 4 mg, 4 mg, Intravenous, Q6H PRN, Lance Coon,  MD, 4 mg at 01/04/19 2334 .  thiamine (VITAMIN B-1) tablet 100 mg, 100 mg, Oral, Daily, 100 mg at 01/06/19 0903 **OR** [DISCONTINUED] thiamine (B-1) injection 100 mg, 100 mg, Intravenous, Daily, Lance Coon, MD, 100 mg at 01/04/19 0908 .  vancomycin (VANCOCIN) IVPB 1000 mg/200 mL premix, 1,000 mg, Intravenous, Q24H, Paticia Stack, [facility]    ALLERGIES   Patient has no known allergies.    REVIEW OF SYSTEMS   10 pt ROS negative except as per subj findings  PHYSICAL EXAMINATION   Vitals:   01/06/19 1500 01/06/19 1600  BP: (!) 142/76 (!) 144/83  Pulse: 76 79  Resp: 20 (!) 21  Temp:    SpO2: 94% 93%    GENERAL:nad HEAD: Normocephalic, atraumatic.  EYES: Pupils equal, round, reactive to light.  No scleral icterus.  MOUTH: Moist mucosal membrane. NECK: Supple. No thyromegaly. No nodules. No JVD.  PULMONARY: decreased bs bilaterally  CARDIOVASCULAR: S1 and S2. Regular rate and rhythm. No murmurs, rubs, or gallops.  GASTROINTESTINAL: Soft, nontender, non-distended. No masses. Positive bowel sounds. No hepatosplenomegaly.  MUSCULOSKELETAL: No swelling, clubbing, or edema.  NEUROLOGIC: Mild distress due to acute illness SKIN:intact,warm,dry   LABS AND IMAGING      LAB RESULTS: Recent Labs  Lab 01/05/19 0324 01/05/19 2256 01/06/19 0446  NA 137 137 138  K 3.8 4.0 3.8  CL 100 98 100  CO2 29 29 29   BUN 48* 53* 55*  CREATININE 2.01* 1.92* 1.82*  GLUCOSE 153* 122* 129*   Recent Labs  Lab 01/04/19 0418 01/05/19 0324 01/06/19 0446  HGB 8.7* 8.7* 8.3*  HCT 27.8* 28.0* 26.9*  WBC 13.3* 11.1* 10.0  PLT 147* 161 188     IMAGING RESULTS: Dg Chest Port 1 View  Result Date: 01/06/2019 CLINICAL DATA:  Respiratory failure. EXAM: PORTABLE CHEST 1 VIEW COMPARISON:  Chest x-ray from yesterday. FINDINGS: Unchanged left chest wall AICD. Stable cardiomegaly status post TAVR. Atherosclerotic calcification of the aortic arch. Unchanged pulmonary vascular congestion and  interstitial thickening. Increased patchy opacity in the right upper lobe. Unchanged left greater than right bibasilar atelectasis. No large pleural effusion. No pneumothorax. No acute osseous abnormality. IMPRESSION: 1. Increasing patchy opacity in the right upper lobe, concerning for pneumonia. 2. Unchanged mild pulmonary interstitial edema and bibasilar atelectasis. Electronically Signed   By: Titus Dubin M.D.   On: 01/06/2019 11:14      ASSESSMENT AND PLAN     Acute on chronic hypoxemic respiratory failure -likely due to acute decompensated HFpEF,  Atelectasis, +/- poss LLL pna-on cefepime/vanco -Currently on nasal cannula6L/min from baseline 2l/min. - creatinine at baseline currently - pt had left nephrectomy for RCC in 2000 - s/p CT chest with Left lower lobe atelectasis and superimposed dependent edema although difficult to definitively   - Aggressive bronchopulmonary hygiene to recruit atelectatic segments - chest physiotherapy, flutter valve, IS - will need outpatient workup for pulmonary hypertension via RHC - RVSP was significantly elevated on TTE will discuss  with cardiology - appreciate input       COPD   -continue nebulizer therapy   - IS/ Chest PT   - no need for steroids due to poss pneumonia and no exacerbation symptoms at this time   GI/Nutrition GI PROPHYLAXIS as indicated DIET-->TF's as tolerated Constipation protocol as indicated  ENDO - ICU hypoglycemic\Hyperglycemia protocol -check FSBS per protocol   ELECTROLYTES -follow labs as needed -replace as needed -pharmacy consultation   DVT/GI PRX ordered -SCDs  TRANSFUSIONS AS NEEDED MONITOR FSBS ASSESS the need for LABS as needed   Critical care provider statement:    Critical care time (minutes):  35   Critical care time was exclusive of:  Separately billable procedures and treating other patients   Critical care was necessary to treat or prevent imminent or  life-threatening deterioration of the following conditions:  Acute on chronic hypoxemic resp failure   Critical care was time spent personally by me on the following activities:  Development of treatment plan with patient or surrogate, discussions with consultants, evaluation of patient's response to treatment, examination of patient, obtaining history from patient or surrogate, ordering and performing treatments and interventions, ordering and review of laboratory studies and re-evaluation of patient's condition.  I assumed direction of critical care for this patient from another provider in my specialty: no    This document was prepared using Dragon voice recognition software and may include unintentional dictation errors.    Ottie Glazier, M.D.  Division of [place]

## 2019-01-06 NOTE — Progress Notes (Signed)
Pt taken off bipap and placed on HFNC at 8lpm, sats 97%, respiratory rate 20/min, no shortness of breath noted, will continue to monitor. RN notified.

## 2019-01-07 LAB — GLUCOSE, CAPILLARY
Glucose-Capillary: 134 mg/dL — ABNORMAL HIGH (ref 70–99)
Glucose-Capillary: 163 mg/dL — ABNORMAL HIGH (ref 70–99)
Glucose-Capillary: 148 mg/dL — ABNORMAL HIGH (ref 70–99)

## 2019-01-07 LAB — BASIC METABOLIC PANEL
Anion gap: 8 (ref 5–15)
BUN: 51 mg/dL — ABNORMAL HIGH (ref 8–23)
CO2: 30 mmol/L (ref 22–32)
Calcium: 9.5 mg/dL (ref 8.9–10.3)
Chloride: 101 mmol/L (ref 98–111)
Creatinine, Ser: 1.63 mg/dL — ABNORMAL HIGH (ref 0.61–1.24)
GFR calc Af Amer: 47 mL/min — ABNORMAL LOW (ref >60)
GFR calc non Af Amer: 41 mL/min — ABNORMAL LOW (ref >60)
Glucose, Bld: 124 mg/dL — ABNORMAL HIGH (ref 70–99)
Potassium: 3.8 mmol/L (ref 3.5–5.1)
Sodium: 139 mmol/L (ref 135–145)

## 2019-01-07 LAB — CBC
HCT: 27.4 % — ABNORMAL LOW (ref 39.0–52.0)
Hemoglobin: 8.2 g/dL — ABNORMAL LOW (ref 13.0–17.0)
MCH: 29.7 pg (ref 26.0–34.0)
MCHC: 29.9 g/dL — ABNORMAL LOW (ref 30.0–36.0)
MCV: 99.3 fL (ref 80.0–100.0)
Platelets: 237 10*3/uL (ref 150–400)
RBC: 2.76 MIL/uL — ABNORMAL LOW (ref 4.22–5.81)
RDW: 16.1 % — ABNORMAL HIGH (ref 11.5–15.5)
WBC: 9 10*3/uL (ref 4.0–10.5)
nRBC: 0 % (ref 0.0–0.2)

## 2019-01-07 LAB — CEA: CEA: 4 ng/mL (ref 0.0–4.7)

## 2019-01-07 MED ORDER — FUROSEMIDE 10 MG/ML IJ SOLN
80.0000 mg | Freq: Once | INTRAMUSCULAR | Status: AC
  Administered 2019-01-07: 80 mg via INTRAVENOUS
  Filled 2019-01-07: qty 8

## 2019-01-07 MED ORDER — METHYLPREDNISOLONE SODIUM SUCC 40 MG IJ SOLR
40.0000 mg | Freq: Two times a day (BID) | INTRAMUSCULAR | Status: DC
  Administered 2019-01-07 – 2019-01-09 (×5): 40 mg via INTRAVENOUS
  Filled 2019-01-07 (×5): qty 1

## 2019-01-07 MED ORDER — INSULIN ASPART 100 UNIT/ML ~~LOC~~ SOLN
0.0000 [IU] | Freq: Three times a day (TID) | SUBCUTANEOUS | Status: DC
  Administered 2019-01-07: 3 [IU] via SUBCUTANEOUS
  Administered 2019-01-07: 12:00:00 2 [IU] via SUBCUTANEOUS
  Administered 2019-01-08: 12:00:00 5 [IU] via SUBCUTANEOUS
  Administered 2019-01-08 (×2): 3 [IU] via SUBCUTANEOUS
  Administered 2019-01-09: 8 [IU] via SUBCUTANEOUS
  Administered 2019-01-09 (×2): 3 [IU] via SUBCUTANEOUS
  Administered 2019-01-10: 2 [IU] via SUBCUTANEOUS
  Administered 2019-01-10 – 2019-01-11 (×3): 3 [IU] via SUBCUTANEOUS
  Administered 2019-01-12 (×2): 2 [IU] via SUBCUTANEOUS
  Administered 2019-01-12 – 2019-01-13 (×3): 3 [IU] via SUBCUTANEOUS
  Administered 2019-01-14 – 2019-01-15 (×3): 2 [IU] via SUBCUTANEOUS
  Administered 2019-01-16 – 2019-01-18 (×4): 3 [IU] via SUBCUTANEOUS
  Administered 2019-01-19 – 2019-01-21 (×3): 2 [IU] via SUBCUTANEOUS
  Filled 2019-01-07 (×26): qty 1

## 2019-01-07 MED ORDER — INSULIN ASPART 100 UNIT/ML ~~LOC~~ SOLN
0.0000 [IU] | Freq: Every day | SUBCUTANEOUS | Status: DC
  Administered 2019-01-09: 3 [IU] via SUBCUTANEOUS
  Administered 2019-01-10 – 2019-01-11 (×2): 2 [IU] via SUBCUTANEOUS
  Filled 2019-01-07 (×4): qty 1

## 2019-01-07 MED ORDER — POTASSIUM CHLORIDE CRYS ER 20 MEQ PO TBCR
40.0000 meq | EXTENDED_RELEASE_TABLET | Freq: Once | ORAL | Status: AC
  Administered 2019-01-07: 40 meq via ORAL
  Filled 2019-01-07: qty 2

## 2019-01-07 NOTE — Progress Notes (Signed)
[place] [facility] [place] NAME: Darius Taylor    MR#:  [id number]  DATE OF BIRTH:  31-Mar-1946  SUBJECTIVE:   Patient with increased work of breathing yesterday now on HFNC  REVIEW OF SYSTEMS:    Review of Systems  Constitutional: Negative for fever, chills weight loss HENT: Negative for ear pain, nosebleeds, congestion, facial swelling, rhinorrhea, neck pain, neck stiffness and ear discharge.   Respiratory: ++ for cough, shortness of breath, no wheezing  Cardiovascular: Negative for chest pain, palpitations and + MILD leg swelling.  Gastrointestinal: Negative for heartburn, abdominal pain, vomiting, diarrhea or consitpation Genitourinary: Negative for dysuria, urgency, frequency, hematuria Musculoskeletal: Negative for back pain or joint pain Neurological: Negative for dizziness, seizures, syncope, focal weakness,  numbness and headaches.  Hematological: Does not bruise/bleed easily.  Psychiatric/Behavioral: Negative for hallucinations, confusion, dysphoric mood    Tolerating Diet: yes      DRUG ALLERGIES:  No Known Allergies  VITALS:  Blood pressure 130/76, pulse 82, temperature 99 F (37.2 C), temperature source Oral, resp. rate (!) 27, height 5\' 11"  (1.803 m), weight 101.3 kg, SpO2 95 %.  PHYSICAL EXAMINATION:  Constitutional: Appears well-developed and well-nourished. No distress. HENT: Normocephalic. Marland Kitchen HFNC Eyes: Conjunctivae and EOM are normal. PERRLA, no scleral icterus.  Neck: Normal ROM. Neck supple. No JVD. No tracheal deviation. CVS: RRR, S1/S2 +, 3/6  murmurs, no gallops, no carotid bruit.  Pulmonary: Bilateral rhonchi  abdominal: Soft. BS +,  no distension, tenderness, rebound or guarding.  Musculoskeletal: Normal range of motion. 1+ LE edema and no tenderness.  Neuro: Alert. CN 2-12 grossly intact. No focal deficits. Skin: Skin is warm and dry. No rash noted. Psychiatric: Normal mood and affect.      LABORATORY  PANEL:   CBC Recent Labs  Lab 01/07/19 0256  WBC 9.0  HGB 8.2*  HCT 27.4*  PLT 237   ------------------------------------------------------------------------------------------------------------------  Chemistries  Recent Labs  Lab 01/05/19 0324  01/06/19 0446 01/07/19 0256  NA 137   < > 138 139  K 3.8   < > 3.8 3.8  CL 100   < > 100 101  CO2 29   < > 29 30  GLUCOSE 153*   < > 129* 124*  BUN 48*   < > 55* 51*  CREATININE 2.01*   < > 1.82* 1.63*  CALCIUM 8.7*   < > 9.2 9.5  MG  --   --  2.7*  --   AST 70*  --   --   --   ALT 83*  --   --   --   ALKPHOS 102  --   --   --   BILITOT 1.0  --   --   --    < > = values in this interval not displayed.   ------------------------------------------------------------------------------------------------------------------  Cardiac Enzymes No results for input(s): TROPONINI in the last 168 hours. ------------------------------------------------------------------------------------------------------------------  RADIOLOGY:  Dg Abd 1 View  Result Date: 01/05/2019 CLINICAL DATA:  Nausea and vomiting EXAM: ABDOMEN - 1 VIEW COMPARISON:  09/28/2016 FINDINGS: There is a large amount of stool in the ascending and transverse colon. The bowel gas pattern is normal. No radio-opaque calculi or other significant radiographic abnormality are seen. Mild osteoarthritis of the right hip. IMPRESSION: Large amount of stool in the ascending and transverse colon. Electronically Signed   By: Kathreen Devoid   On: 01/05/2019 19:42   Dg Chest Port 1 View  Result Date: 01/06/2019  CLINICAL DATA:  Respiratory failure. EXAM: PORTABLE CHEST 1 VIEW COMPARISON:  Chest x-ray from yesterday. FINDINGS: Unchanged left chest wall AICD. Stable cardiomegaly status post TAVR. Atherosclerotic calcification of the aortic arch. Unchanged pulmonary vascular congestion and interstitial thickening. Increased patchy opacity in the right upper lobe. Unchanged left greater than right  bibasilar atelectasis. No large pleural effusion. No pneumothorax. No acute osseous abnormality. IMPRESSION: 1. Increasing patchy opacity in the right upper lobe, concerning for pneumonia. 2. Unchanged mild pulmonary interstitial edema and bibasilar atelectasis. Electronically Signed   By: Titus Dubin M.D.   On: 01/06/2019 11:14     ASSESSMENT AND PLAN:   73 year old male with a history of PAF, EtOH abuse and COPD who presented to the emergency room due to generalized weakness and found to have elevated creatinine.   1.  Acute on chronic kidney disease stage III due to EtOH abuse and dehydration: Creatinine has improved.   2.  Acute on chronic hypoxic respiratory failure: Patient was transferred to ICU and placed on BiPAP and then transferred out of ICU however back to stepdown unit 4/13 due to increasing shortness of breath and hypoxia. Acute hypoxic respiratory failure with worsening O2 requirements due to right upper lobe hospital-acquired pneumonia and acute on chronic diastolic heart failure.  Patient is on cefepime and vancomycin. Patient has tested negative for COVID-19.  Respiratory viral panel was also negative One-time dose of Lasix given this morning. Continue IV steroids. MRSA PCR was negative.. Want to discontinue vancomycin.  3.  Acute on chronic diastolic heart failure:  One-time dose of IV Lasix given this morning.  Case discussed with intensivist.   Follow creatinine and output with 80 mg of Lasix given.    4.  Acute on chronic kidney disease stage III: Creatinine has improved Hold nephrotoxic medications and follow creatinine  5.  PAF: Patient currently in sinus rhythm Continue Eliquis  6.  COPD without signs of exacerbation: Continue nebs  7.  Essential hypertension: Continue Norvasc  D/w dr simonds   Management plans discussed with the patient and he is in agreement.  CODE STATUS: full  TOTAL TIME TAKING CARE OF THIS PATIENT: 28 minutes.      POSSIBLE D/C 2-4 days, DEPENDING ON CLINICAL CONDITION.   Bettey Costa M.D on 01/07/2019 at 12:51 PM  Between 7am to 6pm - Pager - [phone] After 6pm go to [email or web address] - password [email or web address] [place] [facility]  Office  [phone]  CC: Primary care physician; [employer]  Note: This dictation was prepared with Diplomatic Services operational officer dictation along with smaller Company secretary. Any transcriptional errors that result from this process are unintentional.

## 2019-01-07 NOTE — Consult Note (Signed)
Pharmacy Antibiotic Note  Darius Taylor is a 73 y.o. male admitted on 12/29/2018 with a CHF exacerbation now with suspected pneumonia on CXR.  Pharmacy has been consulted for vancomycin and cefepime dosing. SCr appears to be approximately at baseline; patient's renal function is CKD III.  Plan: D5 of antibiotics. Per CCM discussion on 4/15, continue with  Vancomycin 1g IV every 24 hours and Cefepime 2 g IV q12h. Will continue to monitor renal function and adjust dose as needed.    Height: 5\' 11"  (180.3 cm) Weight: 223 lb 5.2 oz (101.3 kg) IBW/kg (Calculated) : 75.3  Temp (24hrs), Avg:98.6 F (37 C), Min:98.2 F (36.8 C), Max:99 F (37.2 C)  Recent Labs  Lab 01/02/19 0828  01/04/19 0418 01/04/19 2304 01/05/19 0324 01/05/19 2256 01/06/19 0446 01/07/19 0256  WBC 9.6  --  13.3*  --  11.1*  --  10.0 9.0  CREATININE 1.57*   < > 1.94*  --  2.01* 1.92* 1.82* 1.63*  VANCORANDOM  --   --   --  18  --  16  --   --    < > = values in this interval not displayed.    Estimated Creatinine Clearance: 48.2 mL/min (A) (by C-G formula based on SCr of 1.63 mg/dL (H)).    No Known Allergies  Antimicrobials this admission: vancomycin 4/11 >>  cefepime 4/11 >>   Microbiology results: 4/13 BCx NGTD 4/11 COVID (-) 4/7 MRSA PCR: positive 4/11 MRSA PCR negative?  Thank you for allowing pharmacy to be a part of this patient's care.  Paticia Stack, PharmD Pharmacy Resident  01/07/2019 2:01 PM

## 2019-01-07 NOTE — Progress Notes (Signed)
Comfortable off BiPAP but still requiring HF [place].  No new complaints  Vitals:   01/07/19 1100 01/07/19 1144 01/07/19 1200 01/07/19 1300  BP: 130/76  131/74 (!) 140/123  Pulse: 82  82 78  Resp: (!) 27  (!) 25 (!) 21  Temp:   98.9 F (37.2 C)   TempSrc:   Oral   SpO2: 93% 95% 93% 98%  Weight:      Height:      HFNC 50%  Gen: NAD HEENT: NCAT, sclerae white Neck: No JVD Lungs: Bilateral crackles, few scattered wheezes Cardiovascular: RRR, no murmurs Abdomen: Soft, nontender, normal BS Ext: without clubbing, cyanosis, edema Neuro: grossly intact Skin: Limited exam, no lesions noted   BMP Latest Ref Rng & Units 01/07/2019 01/06/2019 01/05/2019  Glucose 70 - 99 mg/dL 124(H) 129(H) 122(H)  BUN 8 - 23 mg/dL 51(H) 55(H) 53(H)  Creatinine 0.61 - 1.24 mg/dL 1.63(H) 1.82(H) 1.92(H)  Sodium 135 - 145 mmol/L 139 138 137  Potassium 3.5 - 5.1 mmol/L 3.8 3.8 4.0  Chloride 98 - 111 mmol/L 101 100 98  CO2 22 - 32 mmol/L 30 29 29   Calcium 8.9 - 10.3 mg/dL 9.5 9.2 9.0    CBC Latest Ref Rng & Units 01/07/2019 01/06/2019 01/05/2019  WBC 4.0 - 10.5 K/uL 9.0 10.0 11.1(H)  Hemoglobin 13.0 - 17.0 g/dL 8.2(L) 8.3(L) 8.7(L)  Hematocrit 39.0 - 52.0 % 27.4(L) 26.9(L) 28.0(L)  Platelets 150 - 400 K/uL 237 188 161    CXR: No new film  IMPRESSION: Acute on chronic hypoxemic respiratory failure, likely multifactorial Suspect component of pulmonary edema Suspected pneumonia, NOS Possible component of pneumonitis or acute lung injury  PLAN/REC: Continue supplemental oxygen maintaining SPO2 >90% Continue BiPAP as needed Continue current antibiotics Furosemide ordered today Methylprednisolone ordered today Recheck CXR in a.m.  Merton Border, MD PCCM service Mobile [phone] Pager [phone] 01/07/2019 3:51 PM

## 2019-01-07 NOTE — Progress Notes (Signed)
Nutrition Follow-up  RD working remotely.  DOCUMENTATION CODES:   Not applicable  INTERVENTION:  Continue Nepro Shake po once daily, each supplement provides 425 kcal and 19 grams protein.  Continue MVI daily, thiamine 226 mg daily, folic acid 1 mg daily.  NUTRITION DIAGNOSIS:   Increased nutrient needs related to chronic illness(COPD, CHF, etoh abuse ) as evidenced by estimated needs.  Ongoing.  GOAL:   Patient will meet greater than or equal to 90% of their needs  Progressing.  MONITOR:   PO intake, Supplement acceptance, Labs, Weight trends, I & O's, Skin  REASON FOR ASSESSMENT:   Consult Assessment of nutrition requirement/status  ASSESSMENT:   73 y/o male with h/o COPD, CKD III, etoh abuse admitted with CHF  Patient transferred to ICU on 4/13 with worsening respiratory failure requiring BiPAP. He was able to come off of BiPAP on AM of 4/14. Patient ate 100% of meals on 4/14. Appears to be drinking one bottle of Nepro daily per chart.  Medications reviewed and include: Eliquis, Colace 333 mg daily, folic acid 1 mg daily, magnesium oxide 400 mg BID, MVI daily, thiamine 100 mg daily, cefepime, vancomycin.  Labs reviewed: BUN 51, Creatinine 1.63.  Diet Order:   Diet Order            Diet 2 gram sodium Room service appropriate? Yes; Fluid consistency: Thin  Diet effective now             EDUCATION NEEDS:   Not appropriate for education at this time  Skin:  Skin Assessment: Reviewed RN Assessment(ecchymosis)  Last BM:  01/06/2019 - small type 7  Height:   Ht Readings from Last 1 Encounters:  01/03/19 5\' 11"  (1.803 m)   Weight:   Wt Readings from Last 1 Encounters:  01/07/19 101.3 kg   Ideal Body Weight:  78 kg  BMI:  Body mass index is 31.15 kg/m.  Estimated Nutritional Needs:   Kcal:  2000-2300kcal/day   Protein:  100-110g/day   Fluid:  1579ml/day or per MD  Willey Blade, MS, RD, LDN Office: [phone] Pager: [phone] After  Hours/Weekend Pager: [phone]

## 2019-01-07 NOTE — Plan of Care (Signed)
Pt on HF with slow progression.Marland Kitchen

## 2019-01-08 ENCOUNTER — Inpatient Hospital Stay: Payer: Medicare HMO

## 2019-01-08 LAB — CBC
HCT: 25.5 % — ABNORMAL LOW (ref 39.0–52.0)
Hemoglobin: 7.8 g/dL — ABNORMAL LOW (ref 13.0–17.0)
MCH: 29.7 pg (ref 26.0–34.0)
MCHC: 30.6 g/dL (ref 30.0–36.0)
MCV: 97 fL (ref 80.0–100.0)
Platelets: 299 10*3/uL (ref 150–400)
RBC: 2.63 MIL/uL — ABNORMAL LOW (ref 4.22–5.81)
RDW: 15.9 % — ABNORMAL HIGH (ref 11.5–15.5)
WBC: 12.3 10*3/uL — ABNORMAL HIGH (ref 4.0–10.5)
nRBC: 0 % (ref 0.0–0.2)

## 2019-01-08 LAB — GLUCOSE, CAPILLARY
Glucose-Capillary: 151 mg/dL — ABNORMAL HIGH (ref 70–99)
Glucose-Capillary: 213 mg/dL — ABNORMAL HIGH (ref 70–99)
Glucose-Capillary: 194 mg/dL — ABNORMAL HIGH (ref 70–99)
Glucose-Capillary: 174 mg/dL — ABNORMAL HIGH (ref 70–99)

## 2019-01-08 LAB — BASIC METABOLIC PANEL
Anion gap: 10 (ref 5–15)
BUN: 54 mg/dL — ABNORMAL HIGH (ref 8–23)
CO2: 27 mmol/L (ref 22–32)
Calcium: 9.2 mg/dL (ref 8.9–10.3)
Chloride: 101 mmol/L (ref 98–111)
Creatinine, Ser: 1.69 mg/dL — ABNORMAL HIGH (ref 0.61–1.24)
GFR calc Af Amer: 45 mL/min — ABNORMAL LOW (ref >60)
GFR calc non Af Amer: 39 mL/min — ABNORMAL LOW (ref >60)
Glucose, Bld: 160 mg/dL — ABNORMAL HIGH (ref 70–99)
Potassium: 4.9 mmol/L (ref 3.5–5.1)
Sodium: 138 mmol/L (ref 135–145)

## 2019-01-08 LAB — PROCALCITONIN: Procalcitonin: 6.85 ng/mL

## 2019-01-08 LAB — BRAIN NATRIURETIC PEPTIDE: B Natriuretic Peptide: 291 pg/mL — ABNORMAL HIGH (ref 0.0–100.0)

## 2019-01-08 NOTE — Progress Notes (Signed)
[facility] [place] NAME: Darius Taylor    MR#:  [id number]  DATE OF BIRTH:  [birth year]  SUBJECTIVE:   Patient receiving chest physiotherapy at this moment.  Still remains on high flow nasal cannula.  Reports some cough and shortness of breath however improved since yesterday.  Denies wheezing or chest pain.  REVIEW OF SYSTEMS:    Review of Systems  Constitutional: Negative for fever, chills weight loss HENT: Negative for ear pain, nosebleeds, congestion, facial swelling, rhinorrhea, neck pain, neck stiffness and ear discharge.   Respiratory: ++ for cough, shortness of breath, no wheezing  Cardiovascular: Negative for chest pain, palpitations and + MILD leg swelling.  Gastrointestinal: Negative for heartburn, abdominal pain, vomiting, diarrhea or consitpation Genitourinary: Negative for dysuria, urgency, frequency, hematuria Musculoskeletal: Negative for back pain or joint pain Neurological: Negative for dizziness, seizures, syncope, focal weakness,  numbness and headaches.  Hematological: Does not bruise/bleed easily.  Psychiatric/Behavioral: Negative for hallucinations, confusion, dysphoric mood    Tolerating Diet: yes      DRUG ALLERGIES:  No Known Allergies  VITALS:  Blood pressure 133/79, pulse 72, temperature 98.2 F (36.8 C), temperature source Oral, resp. rate (!) 22, height 5\' 11"  (1.803 m), weight 103.4 kg, SpO2 94 %.  PHYSICAL EXAMINATION:  Constitutional: Appears well-developed and well-nourished. No distress. HENT: Normocephalic. Marland Kitchen HFNC Eyes: Conjunctivae and EOM are normal. PERRLA, no scleral icterus.  Neck: Normal ROM. Neck supple. No JVD. No tracheal deviation. CVS: RRR, S1/S2 +, 3/6  murmurs, no gallops, no carotid bruit.  Pulmonary: Decreased breath sounds left lower lobe no rales or rhonchi no wheezing abdominal: Soft. BS +,  no distension, tenderness, rebound or guarding.  Musculoskeletal: Normal range of  motion. 1+ LE edema and no tenderness.  Neuro: Alert. CN 2-12 grossly intact. No focal deficits. Skin: Skin is warm and dry. No rash noted. Psychiatric: Normal mood and affect.      LABORATORY PANEL:   CBC Recent Labs  Lab 01/08/19 0351  WBC 12.3*  HGB 7.8*  HCT 25.5*  PLT 299   ------------------------------------------------------------------------------------------------------------------  Chemistries  Recent Labs  Lab 01/05/19 0324  01/06/19 0446  01/08/19 0351  NA 137   < > 138   < > 138  K 3.8   < > 3.8   < > 4.9  CL 100   < > 100   < > 101  CO2 29   < > 29   < > 27  GLUCOSE 153*   < > 129*   < > 160*  BUN 48*   < > 55*   < > 54*  CREATININE 2.01*   < > 1.82*   < > 1.69*  CALCIUM 8.7*   < > 9.2   < > 9.2  MG  --   --  2.7*  --   --   AST 70*  --   --   --   --   ALT 83*  --   --   --   --   ALKPHOS 102  --   --   --   --   BILITOT 1.0  --   --   --   --    < > = values in this interval not displayed.   ------------------------------------------------------------------------------------------------------------------  Cardiac Enzymes No results for input(s): TROPONINI in the last 168 hours. ------------------------------------------------------------------------------------------------------------------  RADIOLOGY:  Dg Chest Port 1 View  Result Date: 01/08/2019 CLINICAL DATA:  Respiratory  failure. EXAM: PORTABLE CHEST 1 VIEW COMPARISON:  One-view chest x-ray 01/06/2019 FINDINGS: AND: FINDINGS: AND Heart is enlarged. Aortic valve replacement is noted. Atherosclerotic changes are present in the aorta. Pacing and defibrillator wires are stable. There is improving aeration of both lungs. Right upper lobe and left lower lobe airspace consolidation remain. Mild pulmonary vascular congestion is noted. IMPRESSION: 1. Slight improved aeration of both lungs with persistent right upper lobe and left lower lobe pneumonia. 2. Cardiomegaly with mild respiratory changes.  Electronically Signed   By: San Morelle M.D.   On: 01/08/2019 06:40   Dg Chest Port 1 View  Result Date: 01/06/2019 CLINICAL DATA:  Respiratory failure. EXAM: PORTABLE CHEST 1 VIEW COMPARISON:  Chest x-ray from yesterday. FINDINGS: Unchanged left chest wall AICD. Stable cardiomegaly status post TAVR. Atherosclerotic calcification of the aortic arch. Unchanged pulmonary vascular congestion and interstitial thickening. Increased patchy opacity in the right upper lobe. Unchanged left greater than right bibasilar atelectasis. No large pleural effusion. No pneumothorax. No acute osseous abnormality. IMPRESSION: 1. Increasing patchy opacity in the right upper lobe, concerning for pneumonia. 2. Unchanged mild pulmonary interstitial edema and bibasilar atelectasis. Electronically Signed   By: Titus Dubin M.D.   On: 01/06/2019 11:14     ASSESSMENT AND PLAN:   73 year old male with a history of PAF, EtOH abuse and COPD who presented to the emergency room due to generalized weakness and found to have elevated creatinine.   1.  Acute on chronic kidney disease stage III due to EtOH abuse and dehydration: Creatinine tenuous to show improvement.  2.  Acute on chronic hypoxic respiratory failure: Patient was transferred to ICU and placed on BiPAP and then transferred out of ICU however back to stepdown unit 4/13 due to increasing shortness of breath and hypoxia. Acute hypoxic respiratory failure with worsening O2 requirements due to right upper lobe/left lower lobe hospital-acquired pneumonia and acute on chronic diastolic heart failure.  Patient is on cefepime and vancomycin. Patient has tested negative for COVID-19.  Respiratory viral panel was also negative PRN Lasix has been given by ICU physician. Continue IV steroids. MRSA PCR was negative.  Consider discontinuing vancomycin.   3.  Acute on chronic diastolic heart failure:  Patient would benefit from another dose of IV Lasix. -2.1 L  4.   Acute on chronic kidney disease stage III: Creatinine has improved Hold nephrotoxic medications    5.  PAF: Patient currently in sinus rhythm Continue Eliquis  6.  COPD without signs of exacerbation: Continue nebs  7.  Essential hypertension: Continue Norvasc     Management plans discussed with the patient and he is in agreement.  CODE STATUS: full  TOTAL TIME TAKING CARE OF THIS PATIENT: 28 minutes.     POSSIBLE D/C 2-4 days, DEPENDING ON CLINICAL CONDITION.   Bettey Costa M.D on 01/08/2019 at 9:44 AM  Between 7am to 6pm - Pager - [phone] After 6pm go to [email or web address] - password [email or web address]  Office  [phone]  CC: Primary care physician; [employer]  Note: This dictation was prepared with Diplomatic Services operational officer dictation along with smaller Company secretary. Any transcriptional errors that result from this process are unintentional.

## 2019-01-08 NOTE — Progress Notes (Signed)
Pt states breathing has improved.  Currently on 40% HFNC sitting in chair and tolerating well.   Vitals:   01/08/19 0200 01/08/19 0400 01/08/19 0500 01/08/19 0600  BP: 132/75 138/83 (!) 120/92 133/79  Pulse: 74 73 73 72  Resp: 20 19 11  (!) 22  Temp:  98.2 F (36.8 C)    TempSrc:  Oral    SpO2: 97% 94% (!) 83% 94%  Weight:   103.4 kg   Height:      HFNC 40%  Gen: NAD HEENT: NCAT, sclerae white Neck: No JVD Lungs: faint crackles, even, non labored  Cardiovascular: RRR, no murmurs Abdomen: Soft, nontender, normal BS Ext: without clubbing, cyanosis, edema Neuro: grossly intact Skin: Limited exam, no lesions noted   BMP Latest Ref Rng & Units 01/08/2019 01/07/2019 01/06/2019  Glucose 70 - 99 mg/dL 160(H) 124(H) 129(H)  BUN 8 - 23 mg/dL 54(H) 51(H) 55(H)  Creatinine 0.61 - 1.24 mg/dL 1.69(H) 1.63(H) 1.82(H)  Sodium 135 - 145 mmol/L 138 139 138  Potassium 3.5 - 5.1 mmol/L 4.9 3.8 3.8  Chloride 98 - 111 mmol/L 101 101 100  CO2 22 - 32 mmol/L 27 30 29   Calcium 8.9 - 10.3 mg/dL 9.2 9.5 9.2    CBC Latest Ref Rng & Units 01/08/2019 01/07/2019 01/06/2019  WBC 4.0 - 10.5 K/uL 12.3(H) 9.0 10.0  Hemoglobin 13.0 - 17.0 g/dL 7.8(L) 8.2(L) 8.3(L)  Hematocrit 39.0 - 52.0 % 25.5(L) 27.4(L) 26.9(L)  Platelets 150 - 400 K/uL 299 237 188    CXR: No new film  IMPRESSION: Acute on chronic hypoxemic respiratory failure, likely multifactorial Suspect component of pulmonary edema Suspected pneumonia, NOS Possible component of pneumonitis or acute lung injury  PLAN/REC: Continue supplemental oxygen maintaining SPO2 >90% Scheduled and prn bronchodilator therapy  Continue BiPAP as needed Continue cefepime and vancomycin  Continue iv steroids  Recheck CXR in a.m.  Marda Stalker, [place] Pager [phone] (please enter 7 digits) PCCM Consult Pager [phone] (please enter 7 digits)

## 2019-01-08 NOTE — Consult Note (Signed)
Pharmacy Antibiotic Note  Darius Taylor is a 73 y.o. male admitted on 12/29/2018 with a CHF exacerbation now with suspected pneumonia on CXR.  Pharmacy has been consulted for vancomycin and cefepime dosing. SCr appears to be approximately at baseline; patient's renal function is CKD III.  Plan: D5 of antibiotics. Per CCM discussion on 4/15, continue with Vancomycin 1g IV every 24 hours and Cefepime 2 g IV q12h. Will continue to monitor renal function and adjust dose as needed. D6 of Abx. Will address completing course of therapy on 4/17 as IDSA Guidelines recommend 7 days for CAP related to MRSA.   Height: 5\' 11"  (180.3 cm) Weight: 227 lb 15.3 oz (103.4 kg) IBW/kg (Calculated) : 75.3  Temp (24hrs), Avg:98.5 F (36.9 C), Min:98.2 F (36.8 C), Max:98.9 F (37.2 C)  Recent Labs  Lab 01/04/19 0418 01/04/19 2304 01/05/19 0324 01/05/19 2256 01/06/19 0446 01/07/19 0256 01/08/19 0351  WBC 13.3*  --  11.1*  --  10.0 9.0 12.3*  CREATININE 1.94*  --  2.01* 1.92* 1.82* 1.63* 1.69*  VANCORANDOM  --  18  --  16  --   --   --     Estimated Creatinine Clearance: 46.9 mL/min (A) (by C-G formula based on SCr of 1.69 mg/dL (H)).    No Known Allergies  Antimicrobials this admission: vancomycin 4/11 >>  cefepime 4/11 >>   Microbiology results: 4/13 BCx NGTD 4/11 COVID (-) 4/7 MRSA PCR: positive 4/11 MRSA PCR negative?  Thank you for allowing pharmacy to be a part of this patient's care.  Paticia Stack, PharmD Pharmacy Resident  01/08/2019 2:08 PM

## 2019-01-09 ENCOUNTER — Inpatient Hospital Stay: Payer: Medicare HMO

## 2019-01-09 LAB — CBC WITH DIFFERENTIAL/PLATELET
Abs Immature Granulocytes: 0.95 10*3/uL — ABNORMAL HIGH (ref 0.00–0.07)
Basophils Absolute: 0 10*3/uL (ref 0.0–0.1)
Basophils Relative: 0 %
Eosinophils Absolute: 0 10*3/uL (ref 0.0–0.5)
Eosinophils Relative: 0 %
HCT: 25.9 % — ABNORMAL LOW (ref 39.0–52.0)
Hemoglobin: 7.9 g/dL — ABNORMAL LOW (ref 13.0–17.0)
Immature Granulocytes: 5 %
Lymphocytes Relative: 4 %
Lymphs Abs: 0.7 10*3/uL (ref 0.7–4.0)
MCH: 29.7 pg (ref 26.0–34.0)
MCHC: 30.5 g/dL (ref 30.0–36.0)
MCV: 97.4 fL (ref 80.0–100.0)
Monocytes Absolute: 0.8 10*3/uL (ref 0.1–1.0)
Monocytes Relative: 5 %
Neutro Abs: 15 10*3/uL — ABNORMAL HIGH (ref 1.7–7.7)
Neutrophils Relative %: 86 %
Platelets: 381 10*3/uL (ref 150–400)
RBC: 2.66 MIL/uL — ABNORMAL LOW (ref 4.22–5.81)
RDW: 16.3 % — ABNORMAL HIGH (ref 11.5–15.5)
WBC: 17.5 10*3/uL — ABNORMAL HIGH (ref 4.0–10.5)
nRBC: 0.2 % (ref 0.0–0.2)

## 2019-01-09 LAB — BASIC METABOLIC PANEL
Anion gap: 9 (ref 5–15)
BUN: 72 mg/dL — ABNORMAL HIGH (ref 8–23)
CO2: 25 mmol/L (ref 22–32)
Calcium: 9.3 mg/dL (ref 8.9–10.3)
Chloride: 103 mmol/L (ref 98–111)
Creatinine, Ser: 1.86 mg/dL — ABNORMAL HIGH (ref 0.61–1.24)
GFR calc Af Amer: 40 mL/min — ABNORMAL LOW (ref >60)
GFR calc non Af Amer: 35 mL/min — ABNORMAL LOW (ref >60)
Glucose, Bld: 168 mg/dL — ABNORMAL HIGH (ref 70–99)
Potassium: 4.6 mmol/L (ref 3.5–5.1)
Sodium: 137 mmol/L (ref 135–145)

## 2019-01-09 LAB — PROCALCITONIN: Procalcitonin: 4.12 ng/mL

## 2019-01-09 LAB — GLUCOSE, CAPILLARY
Glucose-Capillary: 186 mg/dL — ABNORMAL HIGH (ref 70–99)
Glucose-Capillary: 185 mg/dL — ABNORMAL HIGH (ref 70–99)
Glucose-Capillary: 264 mg/dL — ABNORMAL HIGH (ref 70–99)
Glucose-Capillary: 278 mg/dL — ABNORMAL HIGH (ref 70–99)

## 2019-01-09 MED ORDER — METHYLPREDNISOLONE SODIUM SUCC 40 MG IJ SOLR: 40.0000 mg | Freq: Every day | INTRAMUSCULAR | Status: DC

## 2019-01-09 MED ORDER — CYANOCOBALAMIN 1000 MCG/ML IJ SOLN
1000.0000 ug | Freq: Once | INTRAMUSCULAR | Status: AC
  Administered 2019-01-09: 18:00:00 1000 ug via SUBCUTANEOUS
  Filled 2019-01-09: qty 1

## 2019-01-09 MED ORDER — ORAL CARE MOUTH RINSE
15.0000 mL | Freq: Two times a day (BID) | OROMUCOSAL | Status: DC
  Administered 2019-01-10 – 2019-01-20 (×12): 15 mL via OROMUCOSAL

## 2019-01-09 MED ORDER — FUROSEMIDE 10 MG/ML IJ SOLN
40.0000 mg | INTRAMUSCULAR | Status: AC
  Administered 2019-01-09: 40 mg via INTRAVENOUS
  Filled 2019-01-09: qty 4

## 2019-01-09 NOTE — Progress Notes (Signed)
Report called to [facility] on 2 A

## 2019-01-09 NOTE — Progress Notes (Signed)
Patient declined Bipap at this time.

## 2019-01-09 NOTE — Evaluation (Signed)
Physical Therapy Re-Evaluation Patient Details Name: Darius Taylor MRN: [id number] DOB: September 02, 1946 Today's Date: 01/09/2019   History of Present Illness  73 y.o. male with a known history of atrial fibrillation on eliquis, CAD, nonischemic cardiomyopathy with last known EF of 25% status post AICD and pacemaker, CKD, history of colonic adenocarcinoma status post partial colectomy, history of renal cell carcinoma status post left nephrectomy, COPD on as needed home oxygen presented to the hospital secondary to worsening ED with a complaint of weakness and fatigue.  He was found here to have an elevated creatinine he has elevated troponin and BNP.  While hospitalized he had increased respiratory distress and needed to be transferred to CCU, possible covid-19 suspected, but testing was negative.  Clinical Impression  Pt sitting at EOB on arrival.  He reported being more interested in eating than doing a lot of PT but was willing to do some walking and get to the recliner (lunch did come during PT re-eval).  He showed good confidence with transfer to/from standing and was able to ambulate with walker safely in the room though he did fatigue during the effort. Pt should be able to return home with assist from daughter (lives next door, does check in regularly) once he is more medically stable.  Pt will need HHPT and likely O2 at home.    Follow Up Recommendations Home health PT;Supervision - Intermittent    Equipment Recommendations  None recommended by PT    Recommendations for Other Services       Precautions / Restrictions Precautions Precautions: Fall Restrictions Weight Bearing Restrictions: No      Mobility  Bed Mobility               General bed mobility comments: Pt sitting at side of bed on arrival, not tested  Transfers Overall transfer level: Independent Equipment used: Rolling walker (2 wheeled)             General transfer comment: Pt did well getting to  standing w/o assist, minimal cuing to use UEs to control descent; multiple sit<>stands t/o session  Ambulation/Gait Ambulation/Gait assistance: Supervision Gait Distance (Feet): 35 Feet Assistive device: Rolling walker (2 wheeled)       General Gait Details: Pt showed good confidence and safety with ambulation, did have some fatigue but overall did well.  On   Stairs            Wheelchair Mobility    Modified Rankin (Stroke Patients Only)       Balance Overall balance assessment: Modified Independent                                           Pertinent Vitals/Pain Pain Assessment: No/denies pain    Home Living Family/patient expects to be discharged to:: Private residence Living Arrangements: Alone Available Help at Discharge: Family;Available PRN/intermittently(daughter lives next door) Type of Home: House Home Access: Stairs to enter Entrance Stairs-Rails: Can reach both Entrance Stairs-Number of Steps: 3 Home Layout: One level Home Equipment: Shower seat;Cane - quad;Walker - 4 wheels;Walker - 2 wheels;Grab bars - toilet;Grab bars - tub/shower      Prior Function Level of Independence: Independent         Comments: Ind amb community distances without an AD, no fall history, PRN O2 at home but typically does not use O2 with community ambulation, Ind with ADLs  Hand Dominance        Extremity/Trunk Assessment   Upper Extremity Assessment Upper Extremity Assessment: Overall WFL for tasks assessed    Lower Extremity Assessment Lower Extremity Assessment: Overall WFL for tasks assessed       Communication   Communication: No difficulties  Cognition Arousal/Alertness: Awake/alert Behavior During Therapy: WFL for tasks assessed/performed Overall Cognitive Status: Within Functional Limits for tasks assessed                                        General Comments      Exercises     Assessment/Plan    PT  Assessment Patient needs continued PT services  PT Problem List Decreased strength;Decreased range of motion;Decreased activity tolerance;Decreased mobility;Decreased balance;Decreased coordination;Decreased knowledge of use of DME;Decreased safety awareness;Decreased knowledge of precautions       PT Treatment Interventions DME instruction;Gait training;Stair training;Functional mobility training;Balance training;Therapeutic exercise;Therapeutic activities;Neuromuscular re-education;Patient/family education    PT Goals (Current goals can be found in the Care Plan section)  Acute Rehab PT Goals Patient Stated Goal: go home... but not until he's feeling better PT Goal Formulation: With patient Time For Goal Achievement: 01/23/19 Potential to Achieve Goals: Good    Frequency Min 2X/week   Barriers to discharge        Co-evaluation               AM-PAC PT "6 Clicks" Mobility  Outcome Measure Help needed turning from your back to your side while in a flat bed without using bedrails?: None Help needed moving from lying on your back to sitting on the side of a flat bed without using bedrails?: None Help needed moving to and from a bed to a chair (including a wheelchair)?: None Help needed standing up from a chair using your arms (e.g., wheelchair or bedside chair)?: None Help needed to walk in hospital room?: A Little Help needed climbing 3-5 steps with a railing? : A Little 6 Click Score: 22    End of Session Equipment Utilized During Treatment: Gait belt;Oxygen Activity Tolerance: Patient limited by fatigue;Patient tolerated treatment well Patient left: with chair alarm set;with call bell/phone within reach;with nursing/sitter in room Nurse Communication: Mobility status PT Visit Diagnosis: Muscle weakness (generalized) (M62.81);Difficulty in walking, not elsewhere classified (R26.2)    Time: 7948-0165 PT Time Calculation (min) (ACUTE ONLY): 31 min   Charges:   PT  Evaluation $PT Re-evaluation: 1 Re-eval PT Treatments $Therapeutic Activity: 8-22 mins        Kreg Shropshire, DPT 01/09/2019, 3:46 PM

## 2019-01-09 NOTE — Evaluation (Signed)
Clinical/Bedside Swallow Evaluation Patient Details  Name: Darius Taylor MRN: [id number] Date of Birth: 1945-10-14  Today's Date: 01/09/2019 Time: SLP Start Time (ACUTE ONLY): 1201 SLP Stop Time (ACUTE ONLY): 1250 SLP Time Calculation (min) (ACUTE ONLY): 49 min  Past Medical History:  Past Medical History:  Diagnosis Date  . Aortic stenosis   . Arrhythmia    atrial fibrillation  . Asthma   . Atrial fibrillation ([place])   . CAD (coronary artery disease)   . Cancer [facility])    colon, bladder and kidney  . CHF (congestive heart failure) ([place])   . Colon adenocarcinoma ([place])   . COPD (chronic obstructive pulmonary disease) ([place])   . H/O ventricular tachycardia   . Hypertension   . Nonischemic cardiomyopathy ([place])    Status post AICD and pacemaker  . Renal cell carcinoma [facility])    Past Surgical History:  Past Surgical History:  Procedure Laterality Date  . CARDIAC DEFIBRILLATOR PLACEMENT    . CARDIAC DEFIBRILLATOR PLACEMENT    . COLONOSCOPY WITH PROPOFOL N/A 06/10/2018   Procedure: COLONOSCOPY WITH PROPOFOL;  Surgeon: Lin Landsman, MD;  Location: [facility] ENDOSCOPY;  Service: Gastroenterology;  Laterality: N/A;  . CORONARY ANGIOPLASTY  09/25/2016  . IRRIGATION AND DEBRIDEMENT HEMATOMA Left 10/24/2016   Procedure: IRRIGATION AND DEBRIDEMENT HEMATOMA;  Surgeon: Florene Glen, MD;  Location: [facility] ORS;  Service: General;  Laterality: Left;  . KIDNEY SURGERY     left ne[hrectomy for RCC  . PACEMAKER IMPLANT    . PARTIAL COLECTOMY     HPI:  Pt is a 73 y.o. male w/ h/o multiple medical issues including CHF, afib, CAD, tachycardia, COPD, asthma, and cellulitis comes in complaining of weakness.  EMS arrived and his blood pressure was in the 60s and O2 sat was in the 88 range.  He does have COPD and does use oxygen as needed.  Patient initially denied using any alcohol but later told the nurse she drank from a half a pint to a pint of alcohol yesterday.  He does still smell with alcohol.   He denies any headache nausea vomiting diarrhea chest pain abdominal pain or anything but some weakness.  He says he is slightly short of breath.  O2 sats in the ER 88% on room air.  Pt has been on increased O2 support and admitted into the CCU d/t Pulmonary decline.  He stated he is now taking Lasix and "feeling better but I have to pee a lot".  He is now weaned to [place] O2 support.    Assessment / Plan / Recommendation Clinical Impression  Pt appears to present w/ adequate oropharyngeal phase swallowing function w/ no immediate, overt s/s of aspiration noted during/post oral intake at BSE today. Pt does have decline Pulmonary status at baseline and is easily SOB w/ exertion. Education was given on taking rest breaks in betwee po trials to lessen any WOB. Pt sat upright in bed and consumed trials of thin liquids, purees/soft solids. No overt coughing or throat clearing noted; O2 sats remained 98% w/ RR of 19-22. Vocal quality was clear b/t trials; no overt increaese respiratory exertion noted w/ po trials following instruction to take rest breaks when needed. Pt exhibited no overt oral phase deficits; min extra time needed for mastication of increased texture d/t missing dentition. Taking his time and chewing foods well, pt demonstrated appropriate A-P transfer w/ all boluses; appropriate oral clearing post trials. Pt fed self w/ min setup A. OM exam appeared [facility] w/  no unilateral weakness noted. Recommend a more mech soft consistency foods diet w/ thin liquids (easier mastication overall). Recommend general aspiration precautions; and Pills WHOLE in puree if easier for swallowing/clearing. No further skilled ST services indicated at this time. NSG to reconsult if any decline in status noted during admission.  SLP Visit Diagnosis: Dysphagia, unspecified (R13.10)    Aspiration Risk  (reduced following general aspiration precautions)    Diet Recommendation  Regular/Mech Soft foods(for easier mastication d/t  lacking sufficient dentition); Thin liquids. General aspiration precautions.  Medication Administration: Whole meds with puree(for easier swallowing)    Other  Recommendations Recommended Consults: (Dietician f/u) Oral Care Recommendations: Oral care BID;Patient independent with oral care Other Recommendations: (n/a)   Follow up Recommendations None      Frequency and Duration (n/a)  (n/a)       Prognosis Prognosis for Safe Diet Advancement: Good      Swallow Study   General Date of Onset: 12/29/18 HPI: Pt is a 73 y.o. male w/ h/o multiple medical issues including CHF, afib, CAD, tachycardia, COPD, asthma, and cellulitis comes in complaining of weakness.  EMS arrived and his blood pressure was in the 60s and O2 sat was in the 88 range.  He does have COPD and does use oxygen as needed.  Patient initially denied using any alcohol but later told the nurse she drank from a half a pint to a pint of alcohol yesterday.  He does still smell with alcohol.  He denies any headache nausea vomiting diarrhea chest pain abdominal pain or anything but some weakness.  He says he is slightly short of breath.  O2 sats in the ER 88% on room air.  Pt has been on increased O2 support and admitted into the CCU d/t Pulmonary decline.  He stated he is now taking Lasix and "feeling better but I have to pee a lot".  He is now weaned to [place] O2 support.  Type of Study: Bedside Swallow Evaluation Previous Swallow Assessment: none  Diet Prior to this Study: Regular;Thin liquids Temperature Spikes Noted: No(wbc 17.5) Respiratory Status: Nasal cannula(5-6 liters) History of Recent Intubation: No Behavior/Cognition: Alert;Cooperative;Pleasant mood Oral Cavity Assessment: Within Functional Limits Oral Care Completed by SLP: Recent completion by staff Oral Cavity - Dentition: Poor condition;Missing dentition Vision: Functional for self-feeding Self-Feeding Abilities: Able to feed self;Needs set up Patient Positioning:  Upright in bed(assisted in positioning) Baseline Vocal Quality: Normal Volitional Cough: Strong;Congested Volitional Swallow: Able to elicit    Oral/Motor/Sensory Function Overall Oral Motor/Sensory Function: Within functional limits   Ice Chips Ice chips: Within functional limits Presentation: Spoon(fed; 2 trials)   Thin Liquid Thin Liquid: Within functional limits Presentation: Cup;Self Fed;Straw(~3-4 ozs total)    Nectar Thick Nectar Thick Liquid: Not tested   Honey Thick Honey Thick Liquid: Not tested   Puree Puree: Within functional limits Presentation: Spoon(fed; 3 trials)   Solid     Solid: Impaired Presentation: Spoon(fed; 3 trials) Oral Phase Impairments: Impaired mastication(missing many dentition) Oral Phase Functional Implications: Impaired mastication(but given time, he masticated and cleared) Pharyngeal Phase Impairments: (none)       Orinda Kenner, MS, CCC-SLP Brahim Dolman 01/09/2019,2:51 PM

## 2019-01-09 NOTE — Progress Notes (Addendum)
Currently on 6L HFNC states he feels a little short of breath   Vitals:   01/09/19 0530 01/09/19 0600 01/09/19 0700 01/09/19 0800  BP:  135/78 (!) 131/108 111/81  Pulse: 81 76 80 74  Resp: 16 (!) 24 (!) 24 (!) 23  Temp:    97.7 F (36.5 C)  TempSrc:    Oral  SpO2: 90% 94% (!) 87% 92%  Weight:      Height:      6L HFNC  Gen: well developed, well nourished male, NAD HEENT: NCAT, sclerae white Neck: No JVD Lungs: diffuse crackles throughout, even, non labored  Cardiovascular: RRR, no murmurs Abdomen: Soft, nontender, normal BS Ext: without clubbing, cyanosis, edema Neuro: alert and oriented, follows commands  Skin: intact no rashes or lesions present   BMP Latest Ref Rng & Units 01/09/2019 01/08/2019 01/07/2019  Glucose 70 - 99 mg/dL 168(H) 160(H) 124(H)  BUN 8 - 23 mg/dL 72(H) 54(H) 51(H)  Creatinine 0.61 - 1.24 mg/dL 1.86(H) 1.69(H) 1.63(H)  Sodium 135 - 145 mmol/L 137 138 139  Potassium 3.5 - 5.1 mmol/L 4.6 4.9 3.8  Chloride 98 - 111 mmol/L 103 101 101  CO2 22 - 32 mmol/L 25 27 30   Calcium 8.9 - 10.3 mg/dL 9.3 9.2 9.5    CBC Latest Ref Rng & Units 01/09/2019 01/08/2019 01/07/2019  WBC 4.0 - 10.5 K/uL 17.5(H) 12.3(H) 9.0  Hemoglobin 13.0 - 17.0 g/dL 7.9(L) 7.8(L) 8.2(L)  Hematocrit 39.0 - 52.0 % 25.9(L) 25.5(L) 27.4(L)  Platelets 150 - 400 K/uL 381 299 237    IMPRESSION: Acute on chronic hypoxemic respiratory failure, likely multifactorial Suspect component of pulmonary edema Suspected pneumonia, NOS Possible component of pneumonitis or acute lung injury Acute on chronic renal failure    PLAN/REC: Continue supplemental oxygen maintaining SPO2 >90% Scheduled and prn bronchodilator therapy  Continue BiPAP as needed Completed course of abx  Continue iv steroids  40 mg iv lasix  Trend BMP  Replace electrolytes as indicated  Monitor UOP  Avoid nephrotoxic medications  VTE px: continue outpatient apixaban   -Will transfer to telemetry unit today   Marda Stalker,  [employer] Pager [phone] (please enter 7 digits) PCCM Consult Pager [phone] (please enter 7 digits)

## 2019-01-09 NOTE — Plan of Care (Signed)

## 2019-01-09 NOTE — Progress Notes (Signed)
Darius Taylor at [place] NAME: Darius Taylor    MR#:  [id number]  DATE OF BIRTH:  October 28, 1945  SUBJECTIVE:   Patient still feels shortness of breath.  He is on nasal cannula today  REVIEW OF SYSTEMS:    Review of Systems  Constitutional: Negative for fever, chills weight loss HENT: Negative for ear pain, nosebleeds, congestion, facial swelling, rhinorrhea, neck pain, neck stiffness and ear discharge.   Respiratory: ++ for cough, shortness of breath, no wheezing  Cardiovascular: Negative for chest pain, palpitations and + MILD leg swelling.  Gastrointestinal: Negative for heartburn, abdominal pain, vomiting, diarrhea or consitpation Genitourinary: Negative for dysuria, urgency, frequency, hematuria Musculoskeletal: Negative for back pain or joint pain Neurological: Negative for dizziness, seizures, syncope, focal weakness,  numbness and headaches.  Hematological: Does not bruise/bleed easily.  Psychiatric/Behavioral: Negative for hallucinations, confusion, dysphoric mood    Tolerating Diet: yes      DRUG ALLERGIES:  No Known Allergies  VITALS:  Blood pressure 111/81, pulse 74, temperature 97.7 F (36.5 C), temperature source Oral, resp. rate 19, height 5\' 11"  (1.803 m), weight 101.6 kg, SpO2 94 %.  PHYSICAL EXAMINATION:  Constitutional: Appears well-developed and well-nourished. No distress. HENT: Normocephalic. . Eyes: Conjunctivae and EOM are normal. PERRLA, no scleral icterus.  Neck: Normal ROM. Neck supple. No JVD. No tracheal deviation. CVS: RRR, S1/S2 +, 3/6  murmurs, no gallops, no carotid bruit.  Pulmonary: Decreased breath sounds left lower lobe no rales or rhonchi no wheezing abdominal: Soft. BS +,  no distension, tenderness, rebound or guarding.  Musculoskeletal: Normal range of motion. 1+ LE edema and no tenderness.  Neuro: Alert. CN 2-12 grossly intact. No focal deficits. Skin: Skin is warm and dry. No rash  noted. Psychiatric: Normal mood and affect.      LABORATORY PANEL:   CBC Recent Labs  Lab 01/09/19 0526  WBC 17.5*  HGB 7.9*  HCT 25.9*  PLT 381   ------------------------------------------------------------------------------------------------------------------  Chemistries  Recent Labs  Lab 01/05/19 0324  01/06/19 0446  01/09/19 0526  NA 137   < > 138   < > 137  K 3.8   < > 3.8   < > 4.6  CL 100   < > 100   < > 103  CO2 29   < > 29   < > 25  GLUCOSE 153*   < > 129*   < > 168*  BUN 48*   < > 55*   < > 72*  CREATININE 2.01*   < > 1.82*   < > 1.86*  CALCIUM 8.7*   < > 9.2   < > 9.3  MG  --   --  2.7*  --   --   AST 70*  --   --   --   --   ALT 83*  --   --   --   --   ALKPHOS 102  --   --   --   --   BILITOT 1.0  --   --   --   --    < > = values in this interval not displayed.   ------------------------------------------------------------------------------------------------------------------  Cardiac Enzymes No results for input(s): TROPONINI in the last 168 hours. ------------------------------------------------------------------------------------------------------------------  RADIOLOGY:  Dg Chest Port 1 View  Result Date: 01/09/2019 CLINICAL DATA:  Acute respiratory failure. EXAM: PORTABLE CHEST 1 VIEW COMPARISON:  Radiographs 01/08/2019 and 01/06/2019.  CT 01/02/2019. FINDINGS: 0937 hours. The left subclavian  pacemaker leads appear stable. There is stable cardiomegaly and aortic atherosclerosis post TAVR. They were stable asymmetric airspace opacities in the right upper lobe and left lung base. No pneumothorax or significant pleural effusion. The bones appear unchanged. IMPRESSION: No significant change in asymmetric bilateral airspace opacities, presumably infectious/inflammatory. Stable cardiomegaly. Electronically Signed   By: Richardean Sale M.D.   On: 01/09/2019 10:24   Dg Chest Port 1 View  Result Date: 01/08/2019 CLINICAL DATA:  Respiratory failure.  EXAM: PORTABLE CHEST 1 VIEW COMPARISON:  One-view chest x-ray 01/06/2019 FINDINGS: AND: FINDINGS: AND Heart is enlarged. Aortic valve replacement is noted. Atherosclerotic changes are present in the aorta. Pacing and defibrillator wires are stable. There is improving aeration of both lungs. Right upper lobe and left lower lobe airspace consolidation remain. Mild pulmonary vascular congestion is noted. IMPRESSION: 1. Slight improved aeration of both lungs with persistent right upper lobe and left lower lobe pneumonia. 2. Cardiomegaly with mild respiratory changes. Electronically Signed   By: San Morelle M.D.   On: 01/08/2019 06:40     ASSESSMENT AND PLAN:   73 year old male with a history of PAF, EtOH abuse and COPD who presented to the emergency room due to generalized weakness and found to have elevated creatinine.   1.  Acute on chronic kidney disease stage III due to EtOH abuse and dehydration: Creatinine improved. 2.  Acute on chronic hypoxic respiratory failure: Patient was transferred to ICU and placed on BiPAP and then transferred out of ICU however back to stepdown unit 4/13 due to increasing shortness of breath and hypoxia. Acute hypoxic respiratory failure with worsening O2 requirements due to right upper lobe/left lower lobe hospital-acquired pneumonia and acute on chronic diastolic heart failure.   Patient has completed treatment with cefepime and vancomycin  Patient has tested negative for COVID-19.  Respiratory viral panel was also negative Lasix 40 mg IV x1 Continue IV steroids. MRSA PCR was negative.   3.  Acute on chronic diastolic heart failure:  40 mg IV Lasix   4.  Acute on chronic kidney disease stage III: Creatinine has improved Hold nephrotoxic medications    5.  PAF: Patient currently in sinus rhythm Continue Eliquis  6.  COPD without signs of exacerbation: Continue nebs  7.  Essential hypertension: Continue Norvasc  8.  Anemia of chronic disease  with stable hemoglobin  D.w dana   Management plans discussed with the patient and he is in agreement.  CODE STATUS: full  TOTAL TIME TAKING CARE OF THIS PATIENT: 28 minutes.     POSSIBLE D/C 2-4 days, DEPENDING ON CLINICAL CONDITION.   Bettey Costa M.D on 01/09/2019 at 12:14 PM  Between 7am to 6pm - Pager - [phone] After 6pm go to [email or web address] - password [email or web address] [facility]  Office  [phone]  CC: Primary care physician; [employer]  Note: This dictation was prepared with Diplomatic Services operational officer dictation along with smaller Company secretary. Any transcriptional errors that result from this process are unintentional.

## 2019-01-10 ENCOUNTER — Inpatient Hospital Stay: Payer: Medicare HMO

## 2019-01-10 LAB — CBC WITH DIFFERENTIAL/PLATELET
Abs Immature Granulocytes: 2.22 10*3/uL — ABNORMAL HIGH (ref 0.00–0.07)
Basophils Absolute: 0.1 10*3/uL (ref 0.0–0.1)
Basophils Relative: 0 %
Eosinophils Absolute: 0 10*3/uL (ref 0.0–0.5)
Eosinophils Relative: 0 %
HCT: 26.6 % — ABNORMAL LOW (ref 39.0–52.0)
Hemoglobin: 8.3 g/dL — ABNORMAL LOW (ref 13.0–17.0)
Immature Granulocytes: 9 %
Lymphocytes Relative: 6 %
Lymphs Abs: 1.3 10*3/uL (ref 0.7–4.0)
MCH: 30.4 pg (ref 26.0–34.0)
MCHC: 31.2 g/dL (ref 30.0–36.0)
MCV: 97.4 fL (ref 80.0–100.0)
Monocytes Absolute: 2.4 10*3/uL — ABNORMAL HIGH (ref 0.1–1.0)
Monocytes Relative: 10 %
Neutro Abs: 17.7 10*3/uL — ABNORMAL HIGH (ref 1.7–7.7)
Neutrophils Relative %: 75 %
Platelets: 450 10*3/uL — ABNORMAL HIGH (ref 150–400)
RBC: 2.73 MIL/uL — ABNORMAL LOW (ref 4.22–5.81)
RDW: 16.7 % — ABNORMAL HIGH (ref 11.5–15.5)
Smear Review: NORMAL
WBC: 23.8 10*3/uL — ABNORMAL HIGH (ref 4.0–10.5)
nRBC: 0.6 % — ABNORMAL HIGH (ref 0.0–0.2)

## 2019-01-10 LAB — BASIC METABOLIC PANEL
Anion gap: 9 (ref 5–15)
BUN: 74 mg/dL — ABNORMAL HIGH (ref 8–23)
CO2: 26 mmol/L (ref 22–32)
Calcium: 9.3 mg/dL (ref 8.9–10.3)
Chloride: 103 mmol/L (ref 98–111)
Creatinine, Ser: 1.67 mg/dL — ABNORMAL HIGH (ref 0.61–1.24)
GFR calc Af Amer: 46 mL/min — ABNORMAL LOW (ref >60)
GFR calc non Af Amer: 40 mL/min — ABNORMAL LOW (ref >60)
Glucose, Bld: 132 mg/dL — ABNORMAL HIGH (ref 70–99)
Potassium: 4.4 mmol/L (ref 3.5–5.1)
Sodium: 138 mmol/L (ref 135–145)

## 2019-01-10 LAB — CULTURE, BLOOD (ROUTINE X 2)
Culture: NO GROWTH
Culture: NO GROWTH
Special Requests: ADEQUATE
Special Requests: ADEQUATE

## 2019-01-10 LAB — GLUCOSE, CAPILLARY
Glucose-Capillary: 122 mg/dL — ABNORMAL HIGH (ref 70–99)
Glucose-Capillary: 189 mg/dL — ABNORMAL HIGH (ref 70–99)
Glucose-Capillary: 245 mg/dL — ABNORMAL HIGH (ref 70–99)

## 2019-01-10 MED ORDER — FUROSEMIDE 10 MG/ML IJ SOLN
40.0000 mg | INTRAMUSCULAR | Status: AC
  Administered 2019-01-10: 40 mg via INTRAVENOUS
  Filled 2019-01-10: qty 4

## 2019-01-10 MED ORDER — SODIUM CHLORIDE 0.9 % IV SOLN
2.0000 g | Freq: Two times a day (BID) | INTRAVENOUS | Status: DC
  Administered 2019-01-10 – 2019-01-11 (×3): 2 g via INTRAVENOUS
  Filled 2019-01-10 (×4): qty 2

## 2019-01-10 MED ORDER — VANCOMYCIN HCL 1000 MG IV SOLR
1000.0000 mg | INTRAVENOUS | Status: DC
  Filled 2019-01-10: qty 1000

## 2019-01-10 MED ORDER — VANCOMYCIN HCL IN DEXTROSE 1-5 GM/200ML-% IV SOLN
1000.0000 mg | INTRAVENOUS | Status: DC
  Administered 2019-01-10 – 2019-01-11 (×2): 1000 mg via INTRAVENOUS
  Filled 2019-01-10 (×2): qty 200

## 2019-01-10 NOTE — Progress Notes (Signed)
Patient declined Bipap at this time.

## 2019-01-10 NOTE — Progress Notes (Signed)
  Speech Language Pathology Treatment: Dysphagia  Patient Details Name: Darius Taylor MRN: [id number] DOB: July 29, 1946 Today's Date: 01/10/2019 Time: 1130-1200 SLP Time Calculation (min) (ACUTE ONLY): 30 min  Assessment / Plan / Recommendation Clinical Impression  Pt seen as f/u for education w/ current diet initiated yesterday in light of his medical and respiratory status'; education on general aspiration precautions. Pt awake, verbally conversive stating he felt "good" today. He sat himself up on the side of the bed to eat his lunch tray brought to him by SLP. Pt seemed less SOB w/ the exertion of activity today.  Pt consumed po trials of the thin liquids via Straw and few bites of grilled cheese sandwich w/ no immediate, overt s/s of aspiration noted. Clear vocal quality noted b/t trials. Pt took his time w/ no decline in respiratory status noted during po trials. Pt fed self. He stated he felt he was doing "fine" w/ his eating/drinking at meals. Encouraged him to follow the general aspiration precautions as discussed including rest breaks to avoid SOB w/ meals and using applesauce to swallow Pills if needed. Discussed foods and consistency of foods that might be easier d/t missing many dentition. Pt agreed.  No further skilled ST services indicated at this time. NSG to reconsult if any needs arise while admitted. Pt agreed.     HPI HPI: Pt is a 73 y.o. male w/ h/o multiple medical issues including CHF, afib, CAD, tachycardia, COPD, asthma, and cellulitis comes in complaining of weakness.  EMS arrived and his blood pressure was in the 60s and O2 sat was in the 88 range.  He does have COPD and does use oxygen as needed.  Patient initially denied using any alcohol but later told the nurse she drank from a half a pint to a pint of alcohol yesterday.  He does still smell with alcohol.  He denies any headache nausea vomiting diarrhea chest pain abdominal pain or anything but some weakness.  He says he  is slightly short of breath.  O2 sats in the ER 88% on room air.  Pt has been on increased O2 support and admitted into the CCU d/t Pulmonary decline.  He stated he is now taking Lasix and "feeling better but I have to pee a lot".  He is now weaned to [place] O2 support.       SLP Plan  All goals met       Recommendations  Diet recommendations: Regular/Dysphagia 3 (mechanical soft for easier chewing d/t Dentition status);Thin liquid Liquids provided via: Cup;Straw Medication Administration: Whole meds with puree(if needed for easier swallowing) Compensations: Minimize environmental distractions;Slow rate;Small sips/bites;Lingual sweep for clearance of pocketing;Follow solids with liquid Postural Changes and/or Swallow Maneuvers: Seated upright 90 degrees;Upright 30-60 min after meal                General recommendations: (TBD) Oral Care Recommendations: Oral care BID;Patient independent with oral care Follow up Recommendations: None SLP Visit Diagnosis: Dysphagia, unspecified (R13.10) Plan: All goals met       GO                Orinda Kenner, MS, CCC-SLP Aimy Sweeting 01/10/2019, 1:35 PM

## 2019-01-10 NOTE — Progress Notes (Signed)
[place] at [place] NAME: Darius Taylor    MR#:  [id number]  DATE OF BIRTH:  May 20, 1946  SUBJECTIVE:   Patient doing well this morning.  Shortness of breath is improved.  REVIEW OF SYSTEMS:    Review of Systems  Constitutional: Negative for fever, chills weight loss HENT: Negative for ear pain, nosebleeds, congestion, facial swelling, rhinorrhea, neck pain, neck stiffness and ear discharge.   Respiratory: no cough, ++IMPROVED shortness of breath, no wheezing  Cardiovascular: Negative for chest pain, palpitations and + MILD leg swelling.  Gastrointestinal: Negative for heartburn, abdominal pain, vomiting, diarrhea or consitpation Genitourinary: Negative for dysuria, urgency, frequency, hematuria Musculoskeletal: Negative for back pain or joint pain Neurological: Negative for dizziness, seizures, syncope, focal weakness,  numbness and headaches.  Hematological: Does not bruise/bleed easily.  Psychiatric/Behavioral: Negative for hallucinations, confusion, dysphoric mood    Tolerating Diet: yes      DRUG ALLERGIES:  No Known Allergies  VITALS:  Blood pressure (!) 120/99, pulse 74, temperature 98.5 F (36.9 C), temperature source Oral, resp. rate 18, height 5\' 11"  (1.803 m), weight 100.6 kg, SpO2 93 %.  PHYSICAL EXAMINATION:  Constitutional: Appears well-developed and well-nourished. No distress. HENT: Normocephalic. . Eyes: Conjunctivae and EOM are normal. PERRLA, no scleral icterus.  Neck: Normal ROM. Neck supple. No JVD. No tracheal deviation. CVS: RRR, S1/S2 +, 3/6  murmurs, no gallops, no carotid bruit.  Pulmonary: Decreased breath sounds left lower lobe no rales or rhonchi no wheezing abdominal: Soft. BS +,  no distension, tenderness, rebound or guarding.  Musculoskeletal: Normal range of motion. 1+ LE edema and no tenderness.  Neuro: Alert. CN 2-12 grossly intact. No focal deficits. Skin: Skin is warm and dry. No rash  noted. Psychiatric: Normal mood and affect.      LABORATORY PANEL:   CBC Recent Labs  Lab 01/10/19 0428  WBC 23.8*  HGB 8.3*  HCT 26.6*  PLT 450*   ------------------------------------------------------------------------------------------------------------------  Chemistries  Recent Labs  Lab 01/05/19 0324  01/06/19 0446  01/10/19 0428  NA 137   < > 138   < > 138  K 3.8   < > 3.8   < > 4.4  CL 100   < > 100   < > 103  CO2 29   < > 29   < > 26  GLUCOSE 153*   < > 129*   < > 132*  BUN 48*   < > 55*   < > 74*  CREATININE 2.01*   < > 1.82*   < > 1.67*  CALCIUM 8.7*   < > 9.2   < > 9.3  MG  --   --  2.7*  --   --   AST 70*  --   --   --   --   ALT 83*  --   --   --   --   ALKPHOS 102  --   --   --   --   BILITOT 1.0  --   --   --   --    < > = values in this interval not displayed.   ------------------------------------------------------------------------------------------------------------------  Cardiac Enzymes No results for input(s): TROPONINI in the last 168 hours. ------------------------------------------------------------------------------------------------------------------  RADIOLOGY:  Dg Chest 1 View  Result Date: 01/10/2019 CLINICAL DATA:  Pneumonia. Hx of asthma, A-fib, CAD CHF, COPD, HTN. EXAM: CHEST  1 VIEW COMPARISON:  01/09/19 FINDINGS: LEFT-sided pacemaker overlies stable enlarged cardiac silhouette.  The diffuse lobar airspace disease in the RIGHT upper lobe. Mild airspace disease in the RIGHT lower lobe. No focal consolidation. No pneumothorax. Chronic elevation LEFT hemidiaphragm. IMPRESSION: 1. No significant interval change. 2. RIGHT lung asymmetric airspace disease representing asymmetric edema versus asymmetric pulmonary infection. Electronically Signed   By: Suzy Bouchard M.D.   On: 01/10/2019 07:50   Dg Chest Port 1 View  Result Date: 01/09/2019 CLINICAL DATA:  Acute respiratory failure. EXAM: PORTABLE CHEST 1 VIEW COMPARISON:  Radiographs  01/08/2019 and 01/06/2019.  CT 01/02/2019. FINDINGS: 0937 hours. The left subclavian pacemaker leads appear stable. There is stable cardiomegaly and aortic atherosclerosis post TAVR. They were stable asymmetric airspace opacities in the right upper lobe and left lung base. No pneumothorax or significant pleural effusion. The bones appear unchanged. IMPRESSION: No significant change in asymmetric bilateral airspace opacities, presumably infectious/inflammatory. Stable cardiomegaly. Electronically Signed   By: Richardean Sale M.D.   On: 01/09/2019 10:24     ASSESSMENT AND PLAN:   73 year old male with a history of PAF, EtOH abuse and COPD who presented to the emergency room due to generalized weakness and found to have elevated creatinine.   1.  Acute on chronic kidney disease stage III due to EtOH abuse and dehydration: Creatinine improved  . 2.  Acute on chronic hypoxic respiratory failure: Patient was transferred to ICU and placed on BiPAP and then transferred out of ICU however back to stepdown unit 4/13 due to increasing shortness of breath and hypoxia. Acute hypoxic respiratory failure with worsening O2 requirements due to right upper lobe/left lower lobe hospital-acquired pneumonia and acute on chronic diastolic heart failure.   Patient will continue cefepime and vancomycin for 7 days total  Patient has tested negative for COVID-19.  Respiratory viral panel was also negative Lasix 40 mg IV x1 Continue IV steroids. MRSA PCR was negative.   3.  Acute on chronic diastolic heart failure:  Monitor need for IV Lasix  4.  Acute on chronic kidney disease stage III: Creatinine has improved Hold nephrotoxic medications    5.  PAF: Patient currently in sinus rhythm Continue Eliquis  6.  COPD without signs of exacerbation: Continue nebs  7.  Essential hypertension: Continue Norvasc  8.  Anemia of chronic disease with stable hemoglobin     Management plans discussed with the patient  and he is in agreement.  CODE STATUS: full  TOTAL TIME TAKING CARE OF THIS PATIENT: 27 minutes.     POSSIBLE D/C tomorrow with HHC if we can get O2 down.   Bettey Costa M.D on 01/10/2019 at 11:17 AM  Between 7am to 6pm - Pager - [phone] After 6pm go to [email or web address] - password [email or web address] [facility]  Office  [phone]  CC: Primary care physician; [employer], [employer]  Note: This dictation was prepared with Diplomatic Services operational officer dictation along with smaller Company secretary. Any transcriptional errors that result from this process are unintentional.

## 2019-01-10 NOTE — Progress Notes (Signed)
Pt ambulated in hallway on 6 l 02.  desats but recovers readily with rest. Able to walk entire loop . Dropped to 78% towards end, but recovered to 93% in 2 minutes.

## 2019-01-11 LAB — CBC
HCT: 25.6 % — ABNORMAL LOW (ref 39.0–52.0)
Hemoglobin: 7.9 g/dL — ABNORMAL LOW (ref 13.0–17.0)
MCH: 30.2 pg (ref 26.0–34.0)
MCHC: 30.9 g/dL (ref 30.0–36.0)
MCV: 97.7 fL (ref 80.0–100.0)
Platelets: 400 10*3/uL (ref 150–400)
RBC: 2.62 MIL/uL — ABNORMAL LOW (ref 4.22–5.81)
RDW: 16.7 % — ABNORMAL HIGH (ref 11.5–15.5)
WBC: 15.9 10*3/uL — ABNORMAL HIGH (ref 4.0–10.5)
nRBC: 2 % — ABNORMAL HIGH (ref 0.0–0.2)

## 2019-01-11 LAB — BASIC METABOLIC PANEL
Anion gap: 6 (ref 5–15)
BUN: 69 mg/dL — ABNORMAL HIGH (ref 8–23)
CO2: 27 mmol/L (ref 22–32)
Calcium: 9 mg/dL (ref 8.9–10.3)
Chloride: 103 mmol/L (ref 98–111)
Creatinine, Ser: 1.51 mg/dL — ABNORMAL HIGH (ref 0.61–1.24)
GFR calc Af Amer: 52 mL/min — ABNORMAL LOW (ref >60)
GFR calc non Af Amer: 45 mL/min — ABNORMAL LOW (ref >60)
Glucose, Bld: 123 mg/dL — ABNORMAL HIGH (ref 70–99)
Potassium: 4 mmol/L (ref 3.5–5.1)
Sodium: 136 mmol/L (ref 135–145)

## 2019-01-11 LAB — GLUCOSE, CAPILLARY
Glucose-Capillary: 187 mg/dL — ABNORMAL HIGH (ref 70–99)
Glucose-Capillary: 99 mg/dL (ref 70–99)
Glucose-Capillary: 151 mg/dL — ABNORMAL HIGH (ref 70–99)
Glucose-Capillary: 197 mg/dL — ABNORMAL HIGH (ref 70–99)
Glucose-Capillary: 204 mg/dL — ABNORMAL HIGH (ref 70–99)

## 2019-01-11 MED ORDER — POTASSIUM CHLORIDE 20 MEQ/15ML (10%) PO SOLN
20.0000 meq | Freq: Every day | ORAL | Status: DC
  Administered 2019-01-12 – 2019-01-21 (×10): 20 meq via ORAL
  Filled 2019-01-11 (×10): qty 15

## 2019-01-11 MED ORDER — FUROSEMIDE 10 MG/ML IJ SOLN
40.0000 mg | Freq: Every day | INTRAMUSCULAR | Status: DC
  Administered 2019-01-11 – 2019-01-12 (×2): 40 mg via INTRAVENOUS
  Filled 2019-01-11 (×2): qty 4

## 2019-01-11 NOTE — Progress Notes (Signed)
Patient declined Bipap at this time. Patient resting comfortably in chair on 5L [place], no distress. RN at bedside.

## 2019-01-11 NOTE — Progress Notes (Signed)
[place] at [place] NAME: Darius Taylor    MR#:  [id number]  DATE OF BIRTH:  11-04-1945  SUBJECTIVE:   Patient better this am Still endorsed SOB however improved over few days Remains on 5L of O2 not on O2 at home   REVIEW OF SYSTEMS:    Review of Systems  Constitutional: Negative for fever, chills weight loss HENT: Negative for ear pain, nosebleeds, congestion, facial swelling, rhinorrhea, neck pain, neck stiffness and ear discharge.   Respiratory: no cough, ++IMPROVED shortness of breath, no wheezing  Cardiovascular: Negative for chest pain, palpitations and + MILD leg swelling.  Gastrointestinal: Negative for heartburn, abdominal pain, vomiting, diarrhea or consitpation Genitourinary: Negative for dysuria, urgency, frequency, hematuria Musculoskeletal: Negative for back pain or joint pain Neurological: Negative for dizziness, seizures, syncope, focal weakness,  numbness and headaches.  Hematological: Does not bruise/bleed easily.  Psychiatric/Behavioral: Negative for hallucinations, confusion, dysphoric mood    Tolerating Diet: yes      DRUG ALLERGIES:  No Known Allergies  VITALS:  Blood pressure (!) 144/53, pulse 74, temperature 98 F (36.7 C), temperature source Oral, resp. rate 20, height 5\' 11"  (1.803 m), weight 100.3 kg, SpO2 98 %.  PHYSICAL EXAMINATION:  Constitutional: Appears well-developed and well-nourished. No distress. HENT: Normocephalic. . Eyes: Conjunctivae and EOM are normal. PERRLA, no scleral icterus.  Neck: Normal ROM. Neck supple. No JVD. No tracheal deviation. CVS: RRR, S1/S2 +, 3/6  murmurs, no gallops, no carotid bruit.  Pulmonary: Normal respiratory effort with some mild rhonchi right upper lobe.   Abdominal: Soft. BS +,  no distension, tenderness, rebound or guarding.  Musculoskeletal: Normal range of motion. 1+ LE edema and no tenderness.  Neuro: Alert. CN 2-12 grossly intact. No focal  deficits. Skin: Skin is warm and dry. No rash noted. Psychiatric: Normal mood and affect.      LABORATORY PANEL:   CBC Recent Labs  Lab 01/11/19 0512  WBC 15.9*  HGB 7.9*  HCT 25.6*  PLT 400   ------------------------------------------------------------------------------------------------------------------  Chemistries  Recent Labs  Lab 01/05/19 0324  01/06/19 0446  01/11/19 0512  NA 137   < > 138   < > 136  K 3.8   < > 3.8   < > 4.0  CL 100   < > 100   < > 103  CO2 29   < > 29   < > 27  GLUCOSE 153*   < > 129*   < > 123*  BUN 48*   < > 55*   < > 69*  CREATININE 2.01*   < > 1.82*   < > 1.51*  CALCIUM 8.7*   < > 9.2   < > 9.0  MG  --   --  2.7*  --   --   AST 70*  --   --   --   --   ALT 83*  --   --   --   --   ALKPHOS 102  --   --   --   --   BILITOT 1.0  --   --   --   --    < > = values in this interval not displayed.   ------------------------------------------------------------------------------------------------------------------  Cardiac Enzymes No results for input(s): TROPONINI in the last 168 hours. ------------------------------------------------------------------------------------------------------------------  RADIOLOGY:  Dg Chest 1 View  Result Date: 01/10/2019 CLINICAL DATA:  Pneumonia. Hx of asthma, A-fib, CAD CHF, COPD, HTN. EXAM: CHEST  1  VIEW COMPARISON:  01/09/19 FINDINGS: LEFT-sided pacemaker overlies stable enlarged cardiac silhouette. The diffuse lobar airspace disease in the RIGHT upper lobe. Mild airspace disease in the RIGHT lower lobe. No focal consolidation. No pneumothorax. Chronic elevation LEFT hemidiaphragm. IMPRESSION: 1. No significant interval change. 2. RIGHT lung asymmetric airspace disease representing asymmetric edema versus asymmetric pulmonary infection. Electronically Signed   By: Suzy Bouchard M.D.   On: 01/10/2019 07:50     ASSESSMENT AND PLAN:   73 year old male with a history of PAF, EtOH abuse and COPD who  presented to the emergency room due to generalized weakness and found to have elevated creatinine.   1.  Acute on chronic kidney disease stage III due to EtOH abuse and dehydration: Creatinine has improved  . 2.  Acute on chronic hypoxic respiratory failure: Patient was transferred to ICU and placed on BiPAP and then transferred out of ICU however back to stepdown unit 4/13 due to increasing shortness of breath and hypoxia. Acute hypoxic respiratory failure with worsening O2 requirements due to right upper lobe/left lower lobe hospital-acquired pneumonia and acute on chronic diastolic heart failure.   Patient to have cefepime and vancomycin for 7 days total.  Patient has tested negative for COVID-19.  Respiratory viral panel was also negative. MRSA PCR was negative.   Continue Lasix IV daily along with potassium.   Patient -6 L.  Aim for -7 to 8 L.   3.  Acute on chronic diastolic heart failure:  Continue IV Lasix Monitor intake and output  4.  Acute on chronic kidney disease stage III: Creatinine has improved    5.  PAF: Patient currently in sinus rhythm Continue Eliquis  6.  COPD without signs of exacerbation: Continue nebs  7.  Essential hypertension: Continue Norvasc  8.  Anemia of chronic disease with stable hemoglobin at 7.9 today.     Management plans discussed with the patient and he is in agreement.  CODE STATUS: full  TOTAL TIME TAKING CARE OF THIS PATIENT: 27 minutes.     POSSIBLE D/C tomorrow with HHC if we can get O2 down.   Bettey Costa M.D on 01/11/2019 at 10:22 AM  Between 7am to 6pm - Pager - [phone] After 6pm go to [email or web address] - password [email or web address] [facility]  Office  [phone]  CC: Primary care physician; [employer]  Note: This dictation was prepared with Diplomatic Services operational officer dictation along with smaller Company secretary. Any transcriptional errors that result from this process are unintentional.

## 2019-01-11 NOTE — Plan of Care (Signed)
  Problem: Clinical Measurements: Goal: Diagnostic test results will improve Outcome: Progressing  Problem: Respiratory: Goal: Ability to maintain adequate ventilation will improve Outcome: Not Progressing Note:  Patient still requiring 5L Hi-Flo nasal cannula

## 2019-01-12 ENCOUNTER — Encounter: Payer: Self-pay | Admitting: *Deleted

## 2019-01-12 LAB — BASIC METABOLIC PANEL
Anion gap: 5 (ref 5–15)
BUN: 62 mg/dL — ABNORMAL HIGH (ref 8–23)
CO2: 29 mmol/L (ref 22–32)
Calcium: 9 mg/dL (ref 8.9–10.3)
Chloride: 107 mmol/L (ref 98–111)
Creatinine, Ser: 1.43 mg/dL — ABNORMAL HIGH (ref 0.61–1.24)
GFR calc Af Amer: 56 mL/min — ABNORMAL LOW (ref >60)
GFR calc non Af Amer: 48 mL/min — ABNORMAL LOW (ref >60)
Glucose, Bld: 135 mg/dL — ABNORMAL HIGH (ref 70–99)
Potassium: 4.3 mmol/L (ref 3.5–5.1)
Sodium: 141 mmol/L (ref 135–145)

## 2019-01-12 LAB — CBC
HCT: 27 % — ABNORMAL LOW (ref 39.0–52.0)
Hemoglobin: 8.2 g/dL — ABNORMAL LOW (ref 13.0–17.0)
MCH: 30.3 pg (ref 26.0–34.0)
MCHC: 30.4 g/dL (ref 30.0–36.0)
MCV: 99.6 fL (ref 80.0–100.0)
Platelets: 385 10*3/uL (ref 150–400)
RBC: 2.71 MIL/uL — ABNORMAL LOW (ref 4.22–5.81)
RDW: 17.1 % — ABNORMAL HIGH (ref 11.5–15.5)
WBC: 14.5 10*3/uL — ABNORMAL HIGH (ref 4.0–10.5)
nRBC: 1.2 % — ABNORMAL HIGH (ref 0.0–0.2)

## 2019-01-12 LAB — GLUCOSE, CAPILLARY
Glucose-Capillary: 124 mg/dL — ABNORMAL HIGH (ref 70–99)
Glucose-Capillary: 125 mg/dL — ABNORMAL HIGH (ref 70–99)
Glucose-Capillary: 169 mg/dL — ABNORMAL HIGH (ref 70–99)
Glucose-Capillary: 171 mg/dL — ABNORMAL HIGH (ref 70–99)

## 2019-01-12 LAB — METHYLMALONIC ACID, SERUM: Methylmalonic Acid, Quantitative: 572 nmol/L — ABNORMAL HIGH (ref 0–378)

## 2019-01-12 NOTE — Progress Notes (Signed)
[facility] [place] NAME: Darius Taylor    MR#:  [id number]  DATE OF BIRTH:  07-17-1946  SUBJECTIVE:   Patient is feeling better still on high flow 5-6 l of oxygen Still endorsed SOB however improved over few days not on O2 at home   REVIEW OF SYSTEMS:    Review of Systems  Constitutional: Negative for fever, chills weight loss HENT: Negative for ear pain, nosebleeds, congestion, facial swelling, rhinorrhea, neck pain, neck stiffness and ear discharge.   Respiratory: no cough, ++IMPROVED shortness of breath, no wheezing  Cardiovascular: Negative for chest pain, palpitations and + MILD leg swelling.  Gastrointestinal: Negative for heartburn, abdominal pain, vomiting, diarrhea or consitpation Genitourinary: Negative for dysuria, urgency, frequency, hematuria Musculoskeletal: Negative for back pain or joint pain Neurological: Negative for dizziness, seizures, syncope, focal weakness,  numbness and headaches.  Hematological: Does not bruise/bleed easily.  Psychiatric/Behavioral: Negative for hallucinations, confusion, dysphoric mood    Tolerating Diet: yes      DRUG ALLERGIES:  No Known Allergies  VITALS:  Blood pressure 132/66, pulse 74, temperature 98.5 F (36.9 C), temperature source Oral, resp. rate 20, height 5\' 11"  (1.803 m), weight 101.2 kg, SpO2 95 %.  PHYSICAL EXAMINATION:  Constitutional: Appears well-developed and well-nourished. No distress. HENT: Normocephalic. . Eyes: Conjunctivae and EOM are normal. PERRLA, no scleral icterus.  Neck: Normal ROM. Neck supple. No JVD. No tracheal deviation. CVS: RRR, S1/S2 +, 3/6  murmurs, no gallops, no carotid bruit.  Pulmonary: Normal respiratory effort with some mild rhonchi right upper lobe.   Abdominal: Soft. BS +,  no distension, tenderness, rebound or guarding.  Musculoskeletal: Normal range of motion. 1+ LE edema and no tenderness.  Neuro: Alert. CN 2-12 grossly intact. No  focal deficits. Skin: Skin is warm and dry. No rash noted. Psychiatric: Normal mood and affect.      LABORATORY PANEL:   CBC Recent Labs  Lab 01/12/19 0549  WBC 14.5*  HGB 8.2*  HCT 27.0*  PLT 385   ------------------------------------------------------------------------------------------------------------------  Chemistries  Recent Labs  Lab 01/06/19 0446  01/12/19 0549  NA 138   < > 141  K 3.8   < > 4.3  CL 100   < > 107  CO2 29   < > 29  GLUCOSE 129*   < > 135*  BUN 55*   < > 62*  CREATININE 1.82*   < > 1.43*  CALCIUM 9.2   < > 9.0  MG 2.7*  --   --    < > = values in this interval not displayed.   ------------------------------------------------------------------------------------------------------------------  Cardiac Enzymes No results for input(s): TROPONINI in the last 168 hours.------------------------------------------------------------------------------------------------------------------  RADIOLOGY:  No results found.   ASSESSMENT AND PLAN:   73 year old male with a history of PAF, EtOH abuse and COPD who presented to the emergency room due to generalized weakness and found to have elevated creatinine.   1.  Acute on chronic kidney disease stage III due to EtOH abuse and dehydration: Creatinine has improved  . 2.  Acute on chronic hypoxic respiratory failure: Patient was transferred to ICU and placed on BiPAP and then transferred out of ICU however back to stepdown unit 4/13 due to increasing shortness of breath and hypoxia. Acute hypoxic respiratory failure with worsening O2 requirements due to right upper lobe/left lower lobe hospital-acquired pneumonia and acute on chronic diastolic heart failure.   Still on high flow 5 to 6 L will try  to wean off.  Patient uses 2 L of oxygen at home as needed  Patient to have cefepime and vancomycin for 7 days total.  Patient has tested negative for COVID-19.  Respiratory viral panel was also negative. MRSA  PCR was negative.  Continue Lasix IV daily along with potassium.     3.  Acute on chronic diastolic heart failure:  Continue IV Lasix Monitor intake and output  4.  Acute on chronic kidney disease stage III: Creatinine has improved    5.  PAF: Patient currently in sinus rhythm Continue Eliquis  6.  COPD without signs of exacerbation: Continue nebs  7.  Essential hypertension: Continue Norvasc  8.  Anemia of chronic disease with stable hemoglobin at 7.9 today.     Management plans discussed with the patient and he is in agreement.  CODE STATUS: full  TOTAL TIME TAKING CARE OF THIS PATIENT: 32 minutes.     POSSIBLE D/C tomorrow with HHC if we can get O2 down.   Nicholes Mango M.D on 01/12/2019 at 12:42 PM  Between 7am to 6pm - Pager - [phone] After 6pm go to [email or web address] - password [email or web address] [facility]  Office  [phone]  CC: Primary care physician; [employer]  Note: This dictation was prepared with Diplomatic Services operational officer dictation along with smaller Company secretary. Any transcriptional errors that result from this process are unintentional.

## 2019-01-12 NOTE — Progress Notes (Signed)
* [facility]     Assessment and Plan:  IMPRESSION: Acute on chronic hypoxemic respiratory failure, likely multifactorial from pulmonary edema, atelectasis, Suspected pneumonia, NOS. Acute on chronic renal failure  doing better.  PLAN/REC: Continue supplemental oxygen maintaining SPO2 >90% Scheduled and prn bronchodilator therapy  Continue lasix. Discussed importance of incentive spirometry a few times per hour.  Wean down oxygen as tolerated.   Date: 01/12/2019  MRN# [id number] Darius Taylor 12/22/1945   Darius Taylor is a 73 y.o. old male seen in follow up for chief complaint of  Chief Complaint  Patient presents with  . Weakness     HPI:   The patient is a 73 year old male admitted with possible pneumonia, pulmonary edema acute respiratory failure and fever.  Is admitted to the hospital on 12/30/2018 weakness and fatigue.  He is undergone swallow eval which showed no evidence of aspiration/significant dysphasia.  Patient has been requiring high flow nasal cannula currently weaned to 5 L. Review of most recent lab testing shows improvement of acute kidney injury, blood cultures negative Imaging personally reviewed, chest x-ray 01/09/2019, cardiomegaly with interstitial changes, possible pulmonary edema which appears largely unchanged from a few days ago.  He is currently on Lasix 40 mg IV daily, Combivent every 6 hours.  Medication:    Current Facility-Administered Medications:  .  0.9 %  sodium chloride infusion, , Intravenous, PRN, Nicholes Mango, MD, Stopped at 01/11/19 0047 .  acetaminophen (TYLENOL) tablet 650 mg, 650 mg, Oral, Q6H PRN, 650 mg at 01/11/19 0451 **OR** acetaminophen (TYLENOL) suppository 650 mg, 650 mg, Rectal, Q6H PRN, Lance Coon, MD .  albuterol (PROVENTIL HFA;VENTOLIN HFA) 108 (90 Base) MCG/ACT inhaler 1-2 puff, 1-2 puff, Inhalation, Q4H PRN, Gouru, Aruna, MD, 2 puff at 01/05/19 1403 .  amLODipine (NORVASC) tablet 10 mg, 10 mg,  Oral, Daily, Lance Coon, MD, 10 mg at 01/12/19 0859 .  apixaban (ELIQUIS) tablet 5 mg, 5 mg, Oral, BID, Lance Coon, MD, 5 mg at 01/12/19 0859 .  aspirin chewable tablet 81 mg, 81 mg, Oral, Daily, Lance Coon, MD, 81 mg at 01/12/19 0900 .  docusate sodium (COLACE) capsule 100 mg, 100 mg, Oral, Daily, Fritzi Mandes, MD, 100 mg at 01/12/19 0859 .  feeding supplement (NEPRO CARB STEADY) liquid 237 mL, 237 mL, Oral, Q24H, Gouru, Aruna, MD, 237 mL at 01/12/19 1213 .  folic acid (FOLVITE) tablet 1 mg, 1 mg, Oral, Daily, Lance Coon, MD, 1 mg at 01/12/19 0859 .  furosemide (LASIX) injection 40 mg, 40 mg, Intravenous, Daily, Mody, Sital, MD, 40 mg at 01/12/19 0859 .  guaiFENesin-dextromethorphan (ROBITUSSIN DM) 100-10 MG/5ML syrup 5 mL, 5 mL, Oral, Q4H PRN, Gouru, Aruna, MD, 5 mL at 01/02/19 0618 .  insulin aspart (novoLOG) injection 0-15 Units, 0-15 Units, Subcutaneous, TID WC, Wilhelmina Mcardle, MD, 2 Units at 01/12/19 1213 .  insulin aspart (novoLOG) injection 0-5 Units, 0-5 Units, Subcutaneous, QHS, Wilhelmina Mcardle, MD, 2 Units at 01/11/19 2133 .  Ipratropium-Albuterol (COMBIVENT) respimat 1 puff, 1 puff, Inhalation, Q6H, Gouru, Aruna, MD, 1 puff at 01/11/19 0047 .  magnesium oxide (MAG-OX) tablet 400 mg, 400 mg, Oral, BID, Lance Coon, MD, 400 mg at 01/12/19 0859 .  MEDLINE mouth rinse, 15 mL, Mouth Rinse, BID, Wilhelmina Mcardle, MD, 15 mL at 01/11/19 2134 .  multivitamin with minerals tablet 1 tablet, 1 tablet, Oral, Daily, Lance Coon, MD, 1 tablet at 01/12/19 [phone] .  [DISCONTINUED] ondansetron (ZOFRAN) tablet 4 mg, 4 mg, Oral,  Q6H PRN, 4 mg at 01/03/19 1407 **OR** ondansetron (ZOFRAN) injection 4 mg, 4 mg, Intravenous, Q6H PRN, Lance Coon, MD, 4 mg at 01/12/19 0421 .  potassium chloride 20 MEQ/15ML (10%) solution 20 mEq, 20 mEq, Oral, Daily, Mody, Sital, MD, 20 mEq at 01/12/19 0859 .  thiamine (VITAMIN B-1) tablet 100 mg, 100 mg, Oral, Daily, 100 mg at 01/12/19 0859 **OR** [DISCONTINUED]  thiamine (B-1) injection 100 mg, 100 mg, Intravenous, Daily, Lance Coon, MD, 100 mg at 01/04/19 0908   Allergies:  Patient has no known allergies.   Review of Systems:  Constitutional: Feels well. Cardiovascular: Denies chest pain, exertional chest pain.  Pulmonary: Denies hemoptysis, pleuritic chest pain.   The remainder of systems were reviewed and were found to be negative other than what is documented in the HPI.    Physical Examination:   VS: BP 132/66 (BP Location: Left Arm)   Pulse 74   Temp 98.5 F (36.9 C) (Oral)   Resp 20   Ht 5\' 11"  (1.803 m)   Wt 101.2 kg   SpO2 95%   BMI 31.12 kg/m   General Appearance: No distress  Neuro:without focal findings, mental status, speech normal, alert and oriented HEENT: PERRLA, EOM intact Pulmonary: No wheezing, No rales  CardiovascularNormal S1,S2.  No m/r/g.  Abdomen: Benign, Soft, non-tender, No masses Renal:  No costovertebral tenderness  GU:  No performed at this time. Endoc: No evident thyromegaly, no signs of acromegaly or Cushing features Skin:   warm, no rashes, no ecchymosis  Extremities: normal, no cyanosis, clubbing.     LABORATORY PANEL:   CBC Recent Labs  Lab 01/12/19 0549  WBC 14.5*  HGB 8.2*  HCT 27.0*  PLT 385   ------------------------------------------------------------------------------------------------------------------  Chemistries  Recent Labs  Lab 01/06/19 0446  01/12/19 0549  NA 138   < > 141  K 3.8   < > 4.3  CL 100   < > 107  CO2 29   < > 29  GLUCOSE 129*   < > 135*  BUN 55*   < > 62*  CREATININE 1.82*   < > 1.43*  CALCIUM 9.2   < > 9.0  MG 2.7*  --   --    < > = values in this interval not displayed.   ------------------------------------------------------------------------------------------------------------------  Cardiac Enzymes No results for input(s): TROPONINI in the last 168 hours. ------------------------------------------------------------  RADIOLOGY:   No  results found for this or any previous visit. Results for orders placed during the hospital encounter of 08/22/18  DG Chest 2 View   Narrative CLINICAL DATA:  Shortness of breath.  EXAM: CHEST - 2 VIEW  COMPARISON:  Radiographs of January 08, 2018.  FINDINGS: Stable cardiomegaly. Atherosclerosis of thoracic aorta is noted. Left-sided pacemaker is unchanged in position. No pneumothorax or pleural effusion is noted. Stable elevated left hemidiaphragm is noted. Status post aortic valve repair. No acute pulmonary disease is noted. Bony thorax is unremarkable.  IMPRESSION: No active cardiopulmonary disease.  Aortic Atherosclerosis (ICD10-I70.0).   Electronically Signed   By: Marijo Conception, M.D.   On: 08/22/2018 10:09    ------------------------------------------------------------------------------------------------------------------  Thank  you for allowing [facility] to assist in the care of your patient. Our recommendations are noted above.  Please contact us if we can be of further service.   Marda Stalker, M.D., F.C.C.P.  Board Certified in Internal Medicine, Pulmonary Medicine, [place], and Sleep Medicine.  [facility] Office Number:  [phone]  01/12/2019  

## 2019-01-12 NOTE — Care Management Important Message (Signed)
Important Message  Patient Details  Name: [name] MRN: [id number] Date of Birth: 05/25/1946   Medicare Important Message Given:  Yes    Dannette Barbara 01/12/2019, 11:17 AM

## 2019-01-12 NOTE — Progress Notes (Signed)
Physical Therapy Treatment Patient Details Name: Darius Taylor MRN: [id number] DOB: 01/13/1946 Today's Date: 01/12/2019    History of Present Illness 73 y.o. male with a known history of atrial fibrillation on eliquis, CAD, nonischemic cardiomyopathy with last known EF of 25% status post AICD and pacemaker, CKD, history of colonic adenocarcinoma status post partial colectomy, history of renal cell carcinoma status post left nephrectomy, COPD on as needed home oxygen presented to the hospital secondary to worsening ED with a complaint of weakness and fatigue.  He was found here to have an elevated creatinine he has elevated troponin and BNP.  While hospitalized he had increased respiratory distress and needed to be transferred to CCU, possible covid-19 suspected, but testing was negative.    PT Comments    Patient is continuing to demonstrate increased safety and proper technique/RW management with basic transfers. Patient is ind in short distance ambulation, with good RW management, but requires supervision for O2 sats. Amb over 7ft with 8L = 99%, over 61ft with 6L = 79% increased to 94% in a couple minutes with pursed lip breathing, which patient continues to require education on. Would benefit from skilled PT to address above deficits and promote optimal return to PLOF    Follow Up Recommendations  Home health PT;Supervision - Intermittent     Equipment Recommendations  None recommended by PT    Recommendations for Other Services       Precautions / Restrictions Precautions Precautions: Fall Restrictions Weight Bearing Restrictions: No    Mobility  Bed Mobility Overal bed mobility: Independent             General bed mobility comments: Patient able to supine > sit ind   Transfers Overall transfer level: Independent Equipment used: Rolling walker (2 wheeled)             General transfer comment: Patient did well standing with RW wtih good safety  ind  Ambulation/Gait   Gait Distance (Feet): 80 Feet Assistive device: Rolling walker (2 wheeled) Gait Pattern/deviations: WFL(Within Functional Limits) Gait velocity: 0.94m/s   General Gait Details: good confidence with ambulation. During first round of chair to door PT increased O2 to 8L and patient is able to ambulate with O2 99%; on second attempt left O2 on 6L and O2 sat to 79% at end of ambulation, able to raise to 94% with time and PLB    Stairs             Wheelchair Mobility    Modified Rankin (Stroke Patients Only)       Balance                                            Cognition                                              Exercises Other Exercises Other Exercises: Patient continues good carry over of basic transfers with RW with safety. Patient is able to ambulate with safety and normalized gait with O2 remaining in high 90s on 8L. Patient rested in chair and attempted second trial of ambulation to door on 6L of oxygen with O2 sat to 79% by the end of ambulation. PT continued to educate patient on pursed lip  breathin technique, which he has difficulty with to raise O2 to appropriate levels.     General Comments        Pertinent Vitals/Pain Pain Assessment: No/denies pain    Home Living                      Prior Function            PT Goals (current goals can now be found in the care plan section) Acute Rehab PT Goals Patient Stated Goal: go home... but not until he's feeling better PT Goal Formulation: With patient Time For Goal Achievement: 01/23/19 Potential to Achieve Goals: Good    Frequency    Min 2X/week      PT Plan Current plan remains appropriate    Co-evaluation              AM-PAC PT "6 Clicks" Mobility   Outcome Measure  Help needed turning from your back to your side while in a flat bed without using bedrails?: None Help needed moving from lying on your back to  sitting on the side of a flat bed without using bedrails?: None   Help needed standing up from a chair using your arms (e.g., wheelchair or bedside chair)?: None Help needed to walk in hospital room?: A Little Help needed climbing 3-5 steps with a railing? : A Little 6 Click Score: 18    End of Session Equipment Utilized During Treatment: Gait belt;Oxygen Activity Tolerance: Patient limited by fatigue;Patient tolerated treatment well Patient left: with chair alarm set;with call bell/phone within reach;with nursing/sitter in room Nurse Communication: Mobility status PT Visit Diagnosis: Muscle weakness (generalized) (M62.81);Difficulty in walking, not elsewhere classified (R26.2)     Time: 1430-1446 PT Time Calculation (min) (ACUTE ONLY): 16 min  Charges:  $Therapeutic Activity: 8-22 mins                    Shelton Silvas PT, DPT   Shelton Silvas 01/12/2019, 3:10 PM

## 2019-01-12 NOTE — Plan of Care (Signed)
  Problem: Activity: Goal: Risk for activity intolerance will decrease Outcome: Progressing Note:  Up to bathroom with standby assist with walker, still short of breath with exertion but recovers quickly. On 6L [place] nasal cannula   Problem: Nutrition: Goal: Adequate nutrition will be maintained Outcome: Progressing   Problem: Coping: Goal: Level of anxiety will decrease Outcome: Progressing   Problem: Elimination: Goal: Will not experience complications related to bowel motility Outcome: Progressing Note:  BM this shift 01/11/2019   Problem: Pain Managment: Goal: General experience of comfort will improve Outcome: Progressing Note:  No complaints of pain this shift   Problem: Safety: Goal: Ability to remain free from injury will improve Outcome: Progressing   Problem: Education: Goal: Knowledge of General Education information will improve Description Including pain rating scale, medication(s)/side effects and non-pharmacologic comfort measures Outcome: Completed/Met

## 2019-01-13 LAB — GLUCOSE, CAPILLARY
Glucose-Capillary: 106 mg/dL — ABNORMAL HIGH (ref 70–99)
Glucose-Capillary: 155 mg/dL — ABNORMAL HIGH (ref 70–99)
Glucose-Capillary: 165 mg/dL — ABNORMAL HIGH (ref 70–99)
Glucose-Capillary: 123 mg/dL — ABNORMAL HIGH (ref 70–99)

## 2019-01-13 MED ORDER — FUROSEMIDE 10 MG/ML IJ SOLN
60.0000 mg | Freq: Every day | INTRAMUSCULAR | Status: DC
  Administered 2019-01-13: 60 mg via INTRAVENOUS

## 2019-01-13 MED ORDER — METOPROLOL SUCCINATE ER 100 MG PO TB24
200.0000 mg | ORAL_TABLET | Freq: Every day | ORAL | Status: DC
  Administered 2019-01-13: 200 mg via ORAL
  Filled 2019-01-13: qty 2

## 2019-01-13 MED ORDER — ALBUMIN HUMAN 25 % IV SOLN
12.5000 g | Freq: Once | INTRAVENOUS | Status: AC
  Administered 2019-01-13: 11:00:00 12.5 g via INTRAVENOUS
  Filled 2019-01-13: qty 50

## 2019-01-13 MED ORDER — AMIODARONE HCL 200 MG PO TABS
200.0000 mg | ORAL_TABLET | Freq: Every day | ORAL | Status: DC
  Administered 2019-01-13 – 2019-01-21 (×9): 200 mg via ORAL
  Filled 2019-01-13 (×9): qty 1

## 2019-01-13 MED ORDER — ALBUTEROL SULFATE (2.5 MG/3ML) 0.083% IN NEBU
2.5000 mg | INHALATION_SOLUTION | RESPIRATORY_TRACT | Status: DC | PRN
  Administered 2019-01-13 – 2019-01-14 (×2): 2.5 mg via RESPIRATORY_TRACT
  Filled 2019-01-13 (×2): qty 3

## 2019-01-13 MED ORDER — SODIUM CHLORIDE 0.9% FLUSH
10.0000 mL | Freq: Two times a day (BID) | INTRAVENOUS | Status: DC
  Administered 2019-01-13 – 2019-01-18 (×11): 10 mL via INTRAVENOUS
  Administered 2019-01-19: 3 mL via INTRAVENOUS
  Administered 2019-01-19 – 2019-01-20 (×3): 10 mL via INTRAVENOUS

## 2019-01-13 NOTE — Progress Notes (Signed)
Speech Language Pathology Treatment: Dysphagia  Patient Details Name: Darius Taylor MRN: [id number] DOB: 07/13/1946 Today's Date: 01/13/2019 Time: 1330-1400 SLP Time Calculation (min) (ACUTE ONLY): 30 min  Assessment / Plan / Recommendation Clinical Impression  Pt seen as f/u for f/u w/ toleration of oral diet and education in light of his medical and respiratory status'. Pt continues to require increased O2 support via [place]; Lasix increased recently pt stated. He endorsed SOB w/ exertion. Noted CXR on 01/10/19. Pt awake, verbally conversive stating he felt "much better" and hoped he could go home soon. He was sitting up on the side of the bed eating his lunch including a salad and sandwich, soup. Pt seemed less SOB w/ the exertion of activity today but was encouraged to NOT talk while masticating and to take a rest break when needed b/t bites/foods.  Pt consumed po trials of the thin liquids via Straw and few bites of sandwich w/ no immediate, overt s/s of aspiration noted. Clear vocal quality noted b/t trials. Pt took his time; no decline in respiratory status noted during po trials. All movements and activity appeared exerting on him. Pt fed self. He stated he doing "fine" w/ his eating/drinking at meals. Encouraged him to follow the general aspiration precautions as discussed including: education on smaller sips/bites, conservation of energy during meals using rest breaks, moistening foods(dipping a sandwich/food in soups), and ordering foods easy to masticate to avoid SOB w/ meals; and using applesauce to swallow Pills if needed. Discussed foods and consistency of foods that might be easier d/t missing many dentition. Pt agreed.  No further skilled ST services indicated at this time. NSG to reconsult if any needs arise while admited. Pt agreed. This session and pt's status/presentation w/ oral intake were discussed w/ MD who agreed.    HPI HPI: Pt is a 73 y.o. male w/ h/o multiple medical issues  including CHF, afib, CAD, tachycardia, COPD, asthma, and cellulitis comes in complaining of weakness.  EMS arrived and his blood pressure was in the 60s and O2 sat was in the 88 range.  He does have COPD and does use oxygen as needed.  Patient initially denied using any alcohol but later told the nurse she drank from a half a pint to a pint of alcohol yesterday.  He does still smell with alcohol.  He denies any headache nausea vomiting diarrhea chest pain abdominal pain or anything but some weakness.  He says he is slightly short of breath.  O2 sats in the ER 88% on room air.  Pt has been on increased O2 support and admitted into the CCU d/t Pulmonary decline.  He stated he is now taking Lasix and "feeling better but I have to pee a lot".  He is now weaned to [place] O2 support.       SLP Plan  All goals met       Recommendations  Diet recommendations: Regular;Dysphagia 3 (mechanical soft);Thin liquid Liquids provided via: Cup;Straw Medication Administration: Whole meds with puree(as needed for easier swallowing) Supervision: Patient able to self feed Compensations: Minimize environmental distractions;Slow rate;Small sips/bites;Follow solids with liquid;Lingual sweep for clearance of pocketing Postural Changes and/or Swallow Maneuvers: Seated upright 90 degrees;Upright 30-60 min after meal                General recommendations: (Dietician f/u) Oral Care Recommendations: Oral care BID;Patient independent with oral care Follow up Recommendations: None SLP Visit Diagnosis: Dysphagia, unspecified (R13.10) Plan: All goals met  [name], MS, CCC-SLP , 01/13/2019, 3:17 PM

## 2019-01-13 NOTE — Consult Note (Signed)
Pulmonary Medicine          Date: 01/13/2019,   MRN# [id number] LONN IM 11-12-1945     AdmissionWeight: 99.8 kg                 CurrentWeight: 101.4 kg       SUBJECTIVE   Patient sitting up in bed watching TV in no distress, continues to require 6 L/min nasal cannula O2.  PAST MEDICAL HISTORY   Past Medical History:  Diagnosis Date   Aortic stenosis    Arrhythmia    atrial fibrillation   Asthma    Atrial fibrillation (HCC)    CAD (coronary artery disease)    Cancer (HCC)    colon, bladder and kidney   CHF (congestive heart failure) (HCC)    Colon adenocarcinoma (HCC)    COPD (chronic obstructive pulmonary disease) ([place])    H/O ventricular tachycardia    Hypertension    Nonischemic cardiomyopathy ([place])    Status post AICD and pacemaker   Renal cell carcinoma ([place])      SURGICAL HISTORY   Past Surgical History:  Procedure Laterality Date   CARDIAC DEFIBRILLATOR PLACEMENT     CARDIAC DEFIBRILLATOR PLACEMENT     COLONOSCOPY WITH PROPOFOL N/A 06/10/2018   Procedure: COLONOSCOPY WITH PROPOFOL;  Surgeon: Lin Landsman, MD;  Location: [facility];  Service: Gastroenterology;  Laterality: N/A;   CORONARY ANGIOPLASTY  09/25/2016   IRRIGATION AND DEBRIDEMENT HEMATOMA Left 10/24/2016   Procedure: IRRIGATION AND DEBRIDEMENT HEMATOMA;  Surgeon: Florene Glen, MD;  Location: [facility] ORS;  Service: General;  Laterality: Left;   KIDNEY SURGERY     left ne[hrectomy for RCC   PACEMAKER IMPLANT     PARTIAL COLECTOMY       FAMILY HISTORY   Family History  Problem Relation Age of Onset   Hypertension Mother    Stroke Father      SOCIAL HISTORY   Social History   Tobacco Use   Smoking status: Former Smoker   Smokeless tobacco: Never Used  Substance Use Topics   Alcohol use: No   Drug use: No     MEDICATIONS    Home Medication:    Current Medication:  Current Facility-Administered Medications:     0.9 %  sodium chloride infusion, , Intravenous, PRN, Gouru, Aruna, MD, Stopped at 01/11/19 0047   acetaminophen (TYLENOL) tablet 650 mg, 650 mg, Oral, Q6H PRN, 650 mg at 01/11/19 0451 **OR** acetaminophen (TYLENOL) suppository 650 mg, 650 mg, Rectal, Q6H PRN, Lance Coon, MD   albumin human 25 % solution 12.5 g, 12.5 g, Intravenous, Once, Alveria Mcglaughlin, MD   albuterol (PROVENTIL HFA;VENTOLIN HFA) 108 (90 Base) MCG/ACT inhaler 1-2 puff, 1-2 puff, Inhalation, Q4H PRN, Gouru, Aruna, MD, 2 puff at 01/05/19 1403   amiodarone (PACERONE) tablet 200 mg, 200 mg, Oral, Daily, Gouru, Aruna, MD   amLODipine (NORVASC) tablet 10 mg, 10 mg, Oral, Daily, Lance Coon, MD, 10 mg at 01/13/19 0817   apixaban (ELIQUIS) tablet 5 mg, 5 mg, Oral, BID, Lance Coon, MD, 5 mg at 01/13/19 0814   aspirin chewable tablet 81 mg, 81 mg, Oral, Daily, Lance Coon, MD, 81 mg at 01/13/19 0817   docusate sodium (COLACE) capsule 100 mg, 100 mg, Oral, Daily, Fritzi Mandes, MD, 100 mg at 01/12/19 0859   feeding supplement (NEPRO CARB STEADY) liquid 237 mL, 237 mL, Oral, Q24H, Gouru, Aruna, MD, 237 mL at 48/18/56 3149   folic acid (FOLVITE) tablet 1  mg, 1 mg, Oral, Daily, Lance Coon, MD, 1 mg at 01/13/19 0816   [START ON 01/14/2019] furosemide (LASIX) injection 60 mg, 60 mg, Intravenous, Daily, Lonie Rummell, MD   guaiFENesin-dextromethorphan (ROBITUSSIN DM) 100-10 MG/5ML syrup 5 mL, 5 mL, Oral, Q4H PRN, Gouru, Aruna, MD, 5 mL at 01/02/19 0618   insulin aspart (novoLOG) injection 0-15 Units, 0-15 Units, Subcutaneous, TID WC, Wilhelmina Mcardle, MD, 3 Units at 01/12/19 1727   insulin aspart (novoLOG) injection 0-5 Units, 0-5 Units, Subcutaneous, QHS, Wilhelmina Mcardle, MD, 2 Units at 01/11/19 2133   Ipratropium-Albuterol (COMBIVENT) respimat 1 puff, 1 puff, Inhalation, Q6H, Gouru, Aruna, MD, 1 puff at 01/11/19 0047   magnesium oxide (MAG-OX) tablet 400 mg, 400 mg, Oral, BID, Lance Coon, MD, 400 mg at 01/12/19  2149   MEDLINE mouth rinse, 15 mL, Mouth Rinse, BID, Wilhelmina Mcardle, MD, 15 mL at 01/12/19 2149   metoprolol succinate (TOPROL-XL) 24 hr tablet 200 mg, 200 mg, Oral, Daily, Gouru, Aruna, MD   multivitamin with minerals tablet 1 tablet, 1 tablet, Oral, Daily, Lance Coon, MD, 1 tablet at 01/13/19 0816   [DISCONTINUED] ondansetron (ZOFRAN) tablet 4 mg, 4 mg, Oral, Q6H PRN, 4 mg at 01/03/19 1407 **OR** ondansetron (ZOFRAN) injection 4 mg, 4 mg, Intravenous, Q6H PRN, Lance Coon, MD, 4 mg at 01/12/19 0421   potassium chloride 20 MEQ/15ML (10%) solution 20 mEq, 20 mEq, Oral, Daily, Mody, Sital, MD, 20 mEq at 01/12/19 0859   thiamine (VITAMIN B-1) tablet 100 mg, 100 mg, Oral, Daily, 100 mg at 01/13/19 0817 **OR** [DISCONTINUED] thiamine (B-1) injection 100 mg, 100 mg, Intravenous, Daily, Lance Coon, MD, 100 mg at 01/04/19 0908    ALLERGIES   Patient has no known allergies.     REVIEW OF SYSTEMS    Review of Systems:  Gen:  Denies  fever, sweats, chills weigh loss  HEENT: Denies blurred vision, double vision, ear pain, eye pain, hearing loss, nose bleeds, sore throat Cardiac:  No dizziness, chest pain or heaviness, chest tightness,edema Resp:   Denies cough or sputum porduction, shortness of breath,wheezing, hemoptysis,  Gi: Denies swallowing difficulty, stomach pain, nausea or vomiting, diarrhea, constipation, bowel incontinence Gu:  Denies bladder incontinence, burning urine Ext:   Denies Joint pain, stiffness or swelling Skin: Denies  skin rash, easy bruising or bleeding or hives Endoc:  Denies polyuria, polydipsia , polyphagia or weight change Psych:   Denies depression, insomnia or hallucinations   Other:  All other systems negative   VS: BP (!) 138/96 (BP Location: Right Arm)    Pulse 82    Temp 98 F (36.7 C) (Oral)    Resp 20    Ht 5\' 11"  (1.803 m)    Wt 101.4 kg    SpO2 95%    BMI 31.19 kg/m      PHYSICAL EXAM    GENERAL:NAD, no fevers, chills, no  weakness no fatigue HEAD: Normocephalic, atraumatic.  EYES: Pupils equal, round, reactive to light. Extraocular muscles intact. No scleral icterus.  MOUTH: Moist mucosal membrane. Dentition intact. No abscess noted.  EAR, NOSE, THROAT: Clear without exudates. No external lesions.  NECK: Supple. No thyromegaly. No nodules. No JVD.  PULMONARY: Decreased breath sounds bilaterally with right lower lobe crackles on auscultation without rhonchorous breath sounds or wheezing CARDIOVASCULAR: S1 and S2. Regular rate and rhythm. No murmurs, rubs, or gallops. No edema. Pedal pulses 2+ bilaterally.  GASTROINTESTINAL: Soft, nontender, nondistended. No masses. Positive bowel sounds. No hepatosplenomegaly.  MUSCULOSKELETAL: 2+ pitting  lower extremity edema NEUROLOGIC: Cranial nerves II through XII are intact. No gross focal neurological deficits. Sensation intact. Reflexes intact.  SKIN: No ulceration, lesions, rashes, or cyanosis. Skin warm and dry. Turgor intact.  PSYCHIATRIC: Mood, affect within normal limits. The patient is awake, alert and oriented x 3. Insight, judgment intact.       IMAGING    Dg Chest 1 View  Result Date: 01/10/2019 CLINICAL DATA:  Pneumonia. Hx of asthma, A-fib, CAD CHF, COPD, HTN. EXAM: CHEST  1 VIEW COMPARISON:  01/09/19 FINDINGS: LEFT-sided pacemaker overlies stable enlarged cardiac silhouette. The diffuse lobar airspace disease in the RIGHT upper lobe. Mild airspace disease in the RIGHT lower lobe. No focal consolidation. No pneumothorax. Chronic elevation LEFT hemidiaphragm. IMPRESSION: 1. No significant interval change. 2. RIGHT lung asymmetric airspace disease representing asymmetric edema versus asymmetric pulmonary infection. Electronically Signed   By: Suzy Bouchard M.D.   On: 01/10/2019 07:50   Dg Chest 1 View  Result Date: 12/31/2018 CLINICAL DATA:  SOB. EXAM: CHEST  1 VIEW COMPARISON:  12/30/2018. FINDINGS: Marked cardiac enlargement. Dual lead pacer/AICD. Mild  vascular congestion. No overt edema. Chronic elevation LEFT hemidiaphragm. No pneumothorax. Aortic atherosclerosis. IMPRESSION: Cardiomegaly. Mild vascular congestion without overt edema. Stable appearance from priors. Electronically Signed   By: Staci Righter M.D.   On: 12/31/2018 08:20   Dg Abd 1 View  Result Date: 01/05/2019 CLINICAL DATA:  Nausea and vomiting EXAM: ABDOMEN - 1 VIEW COMPARISON:  09/28/2016 FINDINGS: There is a large amount of stool in the ascending and transverse colon. The bowel gas pattern is normal. No radio-opaque calculi or other significant radiographic abnormality are seen. Mild osteoarthritis of the right hip. IMPRESSION: Large amount of stool in the ascending and transverse colon. Electronically Signed   By: Kathreen Devoid   On: 01/05/2019 19:42   Ct Chest Wo Contrast  Result Date: 01/02/2019 CLINICAL DATA:  Dyspnea.  History of colon cancer. EXAM: CT CHEST WITHOUT CONTRAST TECHNIQUE: Multidetector CT imaging of the chest was performed following the standard protocol without IV contrast. COMPARISON:  Radiographs of December 31, 2018. CT scan of November 10, 2018. FINDINGS: Cardiovascular: Atherosclerosis of thoracic aorta is noted without aneurysm formation. Mild cardiomegaly is noted. Status post transcatheter aortic valve replacement. Coronary artery calcifications are noted. Mediastinum/Nodes: No enlarged mediastinal or axillary lymph nodes. Thyroid gland, trachea, and esophagus demonstrate no significant findings. Lungs/Pleura: No pneumothorax or pleural effusion is noted. Moderate left lower lobe subsegmental atelectasis or pneumonia is noted which is increased compared to prior exam. Mild right posterior basilar subsegmental atelectasis or inflammation is noted posteriorly in right lower lobe. New ground-glass opacity seen posteriorly in the right upper lobe concerning for pneumonia. Upper Abdomen: No acute abnormality. Musculoskeletal: No chest wall mass or suspicious bone  lesions identified. IMPRESSION: New ground-glass opacity seen posteriorly in right upper lobe concerning for pneumonia; this may represent typical or atypical infection, including viral etiology. Also noted are increased bilateral lower lobe opacities concerning for worsening atelectasis or pneumonia, left worse than right. Coronary artery calcifications are noted. Aortic Atherosclerosis (ICD10-I70.0). Electronically Signed   By: Marijo Conception, M.D.   On: 01/02/2019 16:18   Dg Chest Port 1 View  Result Date: 01/09/2019 CLINICAL DATA:  Acute respiratory failure. EXAM: PORTABLE CHEST 1 VIEW COMPARISON:  Radiographs 01/08/2019 and 01/06/2019.  CT 01/02/2019. FINDINGS: 0937 hours. The left subclavian pacemaker leads appear stable. There is stable cardiomegaly and aortic atherosclerosis post TAVR. They were stable asymmetric airspace opacities in the  right upper lobe and left lung base. No pneumothorax or significant pleural effusion. The bones appear unchanged. IMPRESSION: No significant change in asymmetric bilateral airspace opacities, presumably infectious/inflammatory. Stable cardiomegaly. Electronically Signed   By: Richardean Sale M.D.   On: 01/09/2019 10:24   Dg Chest Port 1 View  Result Date: 01/08/2019 CLINICAL DATA:  Respiratory failure. EXAM: PORTABLE CHEST 1 VIEW COMPARISON:  One-view chest x-ray 01/06/2019 FINDINGS: AND: FINDINGS: AND Heart is enlarged. Aortic valve replacement is noted. Atherosclerotic changes are present in the aorta. Pacing and defibrillator wires are stable. There is improving aeration of both lungs. Right upper lobe and left lower lobe airspace consolidation remain. Mild pulmonary vascular congestion is noted. IMPRESSION: 1. Slight improved aeration of both lungs with persistent right upper lobe and left lower lobe pneumonia. 2. Cardiomegaly with mild respiratory changes. Electronically Signed   By: San Morelle M.D.   On: 01/08/2019 06:40   Dg Chest Port 1  View  Result Date: 01/06/2019 CLINICAL DATA:  Respiratory failure. EXAM: PORTABLE CHEST 1 VIEW COMPARISON:  Chest x-ray from yesterday. FINDINGS: Unchanged left chest wall AICD. Stable cardiomegaly status post TAVR. Atherosclerotic calcification of the aortic arch. Unchanged pulmonary vascular congestion and interstitial thickening. Increased patchy opacity in the right upper lobe. Unchanged left greater than right bibasilar atelectasis. No large pleural effusion. No pneumothorax. No acute osseous abnormality. IMPRESSION: 1. Increasing patchy opacity in the right upper lobe, concerning for pneumonia. 2. Unchanged mild pulmonary interstitial edema and bibasilar atelectasis. Electronically Signed   By: Titus Dubin M.D.   On: 01/06/2019 11:14   Dg Chest Port 1 View  Result Date: 01/05/2019 CLINICAL DATA:  Respiratory failure. EXAM: PORTABLE CHEST 1 VIEW COMPARISON:  CT of the chest on 01/02/2019 and chest x-ray on 12/31/2018 FINDINGS: Stable cardiac enlargement, appearance of pacing/ICD device and appearance transcatheter aortic valve. Lungs show subtle opacities in the right upper lobe and both lung bases potentially representing pneumonia. There also may be a component of mild pulmonary interstitial edema and bibasilar atelectasis. No significant pleural effusion. No pneumothorax. IMPRESSION: Subtle opacities in the right upper lobe and both lung bases potentially representing multifocal pneumonia. There also may be a component of mild pulmonary interstitial edema and bibasilar atelectasis. Electronically Signed   By: Aletta Edouard M.D.   On: 01/05/2019 08:37   Dg Chest Port 1 View  Result Date: 12/30/2018 CLINICAL DATA:  Progressive shortness of breath. EXAM: PORTABLE CHEST 1 VIEW COMPARISON:  Radiograph yesterday. CT 11/10/2018 FINDINGS: Similar cardiomegaly. Unchanged mediastinal contours. Aortic atherosclerosis with aortic root replacement. Left-sided pacemaker remains in place. Vascular congestion  without overt pulmonary edema. Mild elevation of left hemidiaphragm. Improved atelectasis in the left mid lung. No large pleural effusion. No pneumothorax. Unchanged osseous structures. IMPRESSION: Cardiomegaly with vascular congestion. No overt pulmonary edema. Improved left midlung atelectasis. Aortic Atherosclerosis (ICD10-I70.0). Electronically Signed   By: Keith Rake M.D.   On: 12/30/2018 02:01   Dg Chest Portable 1 View  Result Date: 12/29/2018 CLINICAL DATA:  Weakness with apparent hypotension EXAM: PORTABLE CHEST 1 VIEW COMPARISON:  Chest radiograph November 05, 2018 and chest CT November 10, 2018 FINDINGS: There is atelectatic change in the left mid lung. There is no appreciable edema or consolidation. Heart is mildly enlarged with pulmonary vascularity normal. Pacemaker leads are attached the right atrium and right ventricle. There is aortic atherosclerosis. No adenopathy. No bone lesions. There is calcification in each carotid artery. IMPRESSION: Left midlung atelectasis. No edema or consolidation. Mild cardiomegaly. Pacemaker leads  attached to right atrium and right ventricle. Aortic Atherosclerosis (ICD10-I70.0). Bilateral carotid artery calcification noted. Electronically Signed   By: Lowella Grip III M.D.   On: 12/29/2018 21:14      ASSESSMENT/PLAN    Acute on chronic hypoxemic respiratory failure        -acute decompensated diastolic CHF exacerbation -                     -Continues to have significant lower extremity edema and crackles on auscultation at the bases-patient drinking copious amounts of water-we will place on 1200 cc fluid restriction                    -Patient status post left nephrectomy in 2000 and-creatinine is at baseline will increase Lasix to 60 mg twice daily with daily albumin to improve efficacy       -Bibasilar atelectasis -have reviewed with patient appropriate incentive spirometry and Acapella device usage, encourage use each hour multiple times               -Please perform exertional walk to delineate amount of oxygen needed to keep SPO2 over 88%-in preparation to discharge home on increased oxygen       -Febrile episode-possible pneumonia status post antibiotic treatment completed therapy   - will need outpatient workup for pulmonary hypertension via RHC - RVSP was significantly elevated on TTE will discuss with cardiology - appreciate input   -Please allow patient to work with physical therapy to help combat atelectasis and recruit collapsed segments     COPD   -continue nebulizer therapy   - IS/ Chest PT   - no need for steroids at this time     Thank you for allowing me to participate in the care of this patient.   This document was prepared using Dragon voice recognition software and may include unintentional dictation errors.     Ottie Glazier, M.D.  Division of [place]

## 2019-01-13 NOTE — Progress Notes (Addendum)
[facility] [place] NAME: Travez Stancil    MR#:  [id number]  DATE OF BIRTH:  08/12/1946  SUBJECTIVE:   Patient is feeling better still on high flow 5-6 l of oxygen.  Reporting he is making good urine Still endorsed SOB with minimal exertion.  Reports using 2 L of oxygen as needed at home prior to this admission  REVIEW OF SYSTEMS:    Review of Systems  Constitutional: Negative for fever, chills weight loss HENT: Negative for ear pain, nosebleeds, congestion, facial swelling, rhinorrhea, neck pain, neck stiffness and ear discharge.   Respiratory: no cough, ++IMPROVED shortness of breath, no wheezing  Cardiovascular: Negative for chest pain, palpitations and + MILD leg swelling.  Gastrointestinal: Negative for heartburn, abdominal pain, vomiting, diarrhea or consitpation Genitourinary: Negative for dysuria, urgency, frequency, hematuria Musculoskeletal: Negative for back pain or joint pain Neurological: Negative for dizziness, seizures, syncope, focal weakness,  numbness and headaches.  Hematological: Does not bruise/bleed easily.  Psychiatric/Behavioral: Negative for hallucinations, confusion, dysphoric mood    Tolerating Diet: yes      DRUG ALLERGIES:  No Known Allergies  VITALS:  Blood pressure (!) 138/96, pulse 82, temperature 98 F (36.7 C), temperature source Oral, resp. rate 20, height 5\' 11"  (1.803 m), weight 101.4 kg, SpO2 95 %.  PHYSICAL EXAMINATION:  Constitutional: Appears well-developed and well-nourished. No distress. HENT: Normocephalic. . Eyes: Conjunctivae and EOM are normal. PERRLA, no scleral icterus.  Neck: Normal ROM. Neck supple. No JVD. No tracheal deviation. CVS: RRR, S1/S2 +, 3/6  murmurs, no gallops, no carotid bruit.  Pulmonary: Normal respiratory effort with some mild rhonchi right upper lobe.   Abdominal: Soft. BS +,  no distension, tenderness, rebound or guarding.  Musculoskeletal: Normal range of  motion. 1+ LE edema and no tenderness.  Neuro: Alert. CN 2-12 grossly intact. No focal deficits. Skin: Skin is warm and dry. No rash noted. Psychiatric: Normal mood and affect.      LABORATORY PANEL:   CBC Recent Labs  Lab 01/12/19 0549  WBC 14.5*  HGB 8.2*  HCT 27.0*  PLT 385   ------------------------------------------------------------------------------------------------------------------  Chemistries  Recent Labs  Lab 01/12/19 0549  NA 141  K 4.3  CL 107  CO2 29  GLUCOSE 135*  BUN 62*  CREATININE 1.43*  CALCIUM 9.0   ------------------------------------------------------------------------------------------------------------------  Cardiac Enzymes No results for input(s): TROPONINI in the last 168 hours.------------------------------------------------------------------------------------------------------------------  RADIOLOGY:  No results found.   ASSESSMENT AND PLAN:   73 year old male with a history of PAF, EtOH abuse and COPD who presented to the emergency room due to generalized weakness and found to have elevated creatinine.   1.  Acute on chronic kidney disease stage III due to EtOH abuse and dehydration: Creatinine has improved  . 2.  Acute on chronic hypoxic respiratory failure: Patient was transferred to ICU and placed on BiPAP and then transferred out of ICU however back to stepdown unit 4/13 due to increasing shortness of breath and hypoxia. Acute hypoxic respiratory failure with worsening O2 requirements due to right upper lobe/left lower lobe hospital-acquired pneumonia and acute on chronic diastolic heart failure.   Increase Lasix dose as patient still has significant lower extremity edema and bilateral rales and rhonchi.  Fluid restriction to 1200 cc/day Still on high flow 5 to 6 L will try to wean off.  Patient uses 2 L of oxygen at home as needed  Patient received cefepime and vancomycin for 7 days total.  Completed  antibiotic  course Patient has tested negative for COVID-19.  Respiratory viral panel was also negative. MRSA PCR was negative.  IV albumin for third spacing    3.  Acute on chronic diastolic heart failure:  Continue IV Lasix Monitor intake and output  4.  Acute on chronic kidney disease stage III: Creatinine has improved.  Continue close monitoring and avoid nephrotoxins  5.  PAF: Patient currently in sinus rhythm Continue Eliquis.  Resume home medication amiodarone  6.  COPD without signs of exacerbation: Continue nebs  7.  Essential hypertension: Continue Norvasc  8.  Anemia of chronic disease with stable hemoglobin at 7.9 today.     Management plans discussed with the patient and he is in agreement.  CODE STATUS: full  TOTAL TIME TAKING CARE OF THIS PATIENT: 32 minutes.     POSSIBLE D/C tomorrow with [facility]HC if we can get O2 down.   Nicholes Mango M.D on 01/13/2019 at 1:30 PM  Between 7am to 6pm - Pager - [phone] After 6pm go to [email or web address] - password [email or web address] [facility]  Office  [phone]  CC: Primary care physician; [employer]  Note: This dictation was prepared with Diplomatic Services operational officer dictation along with smaller Company secretary. Any transcriptional errors that result from this process are unintentional.

## 2019-01-13 NOTE — Progress Notes (Signed)
Patient declined Bipap.

## 2019-01-13 NOTE — Progress Notes (Signed)
Nutrition Follow-up  RD working remotely.  DOCUMENTATION CODES:   Not applicable  INTERVENTION:   Continue Nepro Shake po once daily, each supplement provides 425 kcal and 19 grams protein.  Continue MVI daily, thiamine 159 mg daily, folic acid 1 mg daily.  NUTRITION DIAGNOSIS:   Increased nutrient needs related to chronic illness(COPD, CHF, etoh abuse ) as evidenced by increased estimated needs.  Ongoing.  GOAL:   Patient will meet greater than or equal to 90% of their needs  Progressing.  MONITOR:   PO intake, Supplement acceptance, Labs, Weight trends, I & O's, Skin  REASON FOR ASSESSMENT:   Consult Assessment of nutrition requirement/status  ASSESSMENT:   73 y/o male with h/o COPD, CKD III, etoh abuse admitted with CHF Pt s/p nephrectomy in 2000  Pt continues to do well; pt eating 100% of meals and drinking supplements. Per chart, pt up 11lbs since admit; pt noted to have significant LE edema and pulmonary edema. Recommend continue supplements and vitamins.   Medications reviewed and include: Eliquis, aspirin, Colace, folic acid, lasix, insulin, magnesium oxide, MVI, thiamine, KCl  Labs reviewed: BUN 62(H), creat 1.43(H)- 4/20 Wbc- 14.5(H), Hgb 8.2(L), Hct 27.0(L)  Diet Order:   Diet Order            Diet 2 gram sodium Room service appropriate? Yes with Assist; Fluid consistency: Thin  Diet effective now             EDUCATION NEEDS:   Not appropriate for education at this time  Skin:  Skin Assessment: Reviewed RN Assessment(ecchymosis)  Last BM:  4/20- type 5  Height:   Ht Readings from Last 1 Encounters:  01/03/19 5\' 11"  (1.803 m)   Weight:   Wt Readings from Last 1 Encounters:  01/13/19 101.4 kg   Ideal Body Weight:  78 kg  BMI:  Body mass index is 31.19 kg/m.  Estimated Nutritional Needs:   Kcal:  2000-2300kcal/day   Protein:  100-110g/day   Fluid:  1520ml/day or per MD  Koleen Distance MS, RD, LDN Pager #-  [phone] Office#- [phone] After Hours Pager: [phone]

## 2019-01-13 NOTE — Progress Notes (Signed)
Physical Therapy Treatment Patient Details Name: Darius Taylor MRN: [id number] DOB: 1946/05/21 Today's Date: 01/13/2019    History of Present Illness 73 y.o. male with a known history of atrial fibrillation on eliquis, CAD, nonischemic cardiomyopathy with last known EF of 25% status post AICD and pacemaker, CKD, history of colonic adenocarcinoma status post partial colectomy, history of renal cell carcinoma status post left nephrectomy, COPD on as needed home oxygen presented to the hospital secondary to worsening ED with a complaint of weakness and fatigue.  He was found here to have an elevated creatinine he has elevated troponin and BNP.  While hospitalized he had increased respiratory distress and needed to be transferred to CCU, possible covid-19 suspected, but testing was negative.    PT Comments    Patient is continuing to be independent with mobility with PT needed for safety with O2 sats. PT increased O2 while ambulating from 6L to 10L where patient is able to maintain O2 sats over 90% until returning to his room where he de-satted to 81% and was able to increase O2 to 90% following a few minutes and cuing for PLB. PT was able to decrease O2 to 6L following with rest. Patient continues to have difficulty with pursed lip breathing but is able to complete with accuracy following PT cuing. Would benefit from skilled PT to address above deficits and promote optimal return to PLOF   Follow Up Recommendations  Home health PT;Supervision - Intermittent     Equipment Recommendations  None recommended by PT    Recommendations for Other Services       Precautions / Restrictions      Mobility  Bed Mobility Overal bed mobility: Independent             General bed mobility comments: Patient able to supine > sit ind   Transfers Overall transfer level: Independent Equipment used: Rolling walker (2 wheeled)             General transfer comment: Patient did well standing with  RW wtih good safety ind  Ambulation/Gait Ambulation/Gait assistance: Supervision Gait Distance (Feet): 80 Feet Assistive device: Rolling walker (2 wheeled) Gait Pattern/deviations: Narrow base of support Gait velocity: 0.32m/s   General Gait Details: good confidence with ambulation. Able to walk without breaks with O2 on 10L to date with 81% O2 at the end of ambulation, which he is able to elevate to 91% following rest and PLB    Stairs   Stairs assistance: Min guard Stair Management: Two rails   General stair comments: Pt reports effort as close to baseline (performed on 8L)    Wheelchair Mobility    Modified Rankin (Stroke Patients Only)       Balance Overall balance assessment: Modified Independent                                          Cognition Arousal/Alertness: Awake/alert Behavior During Therapy: WFL for tasks assessed/performed Overall Cognitive Status: Within Functional Limits for tasks assessed                                        Exercises Other Exercises Other Exercises: Patient continues to have good carry over and confedience with ambulation. patient is ind in transfers and mobility from a physicalstand point but has  trouble with PLB and energy conservation despite PT cuing. PT increased O2 to 10L while walking to prevent desatting, which he is able to maintain 90 and above until the way back to his room where he desats to 81% and is able to raise O2 to 90% with PLB and time on 10L. Following O3 brought back down to 6L    General Comments        Pertinent Vitals/Pain Pain Assessment: No/denies pain    Home Living                      Prior Function            PT Goals (current goals can now be found in the care plan section) Acute Rehab PT Goals Patient Stated Goal: go home... but not until he's feeling better PT Goal Formulation: With patient Time For Goal Achievement: 01/23/19 Potential to  Achieve Goals: Good    Frequency    Min 2X/week      PT Plan Current plan remains appropriate    Co-evaluation              AM-PAC PT "6 Clicks" Mobility   Outcome Measure  Help needed turning from your back to your side while in a flat bed without using bedrails?: None Help needed moving from lying on your back to sitting on the side of a flat bed without using bedrails?: None Help needed moving to and from a bed to a chair (including a wheelchair)?: None Help needed standing up from a chair using your arms (e.g., wheelchair or bedside chair)?: None Help needed to walk in hospital room?: A Little Help needed climbing 3-5 steps with a railing? : A Little 6 Click Score: 22    End of Session Equipment Utilized During Treatment: Gait belt;Oxygen Activity Tolerance: Patient limited by fatigue;Patient tolerated treatment well Patient left: with chair alarm set;with call bell/phone within reach;with nursing/sitter in room Nurse Communication: Mobility status PT Visit Diagnosis: Muscle weakness (generalized) (M62.81);Difficulty in walking, not elsewhere classified (R26.2)     Time: 3254-9826 PT Time Calculation (min) (ACUTE ONLY): 23 min  Charges:  $Therapeutic Activity: 23-37 mins                     Shelton Silvas PT, DPT   Shelton Silvas 01/13/2019, 4:15 PM

## 2019-01-13 NOTE — Plan of Care (Signed)
  Problem: Nutrition: Goal: Adequate nutrition will be maintained Outcome: Progressing   Problem: Coping: Goal: Level of anxiety will decrease Outcome: Progressing   Problem: Elimination: Goal: Will not experience complications related to urinary retention Outcome: Progressing   Problem: Pain Managment: Goal: General experience of comfort will improve Outcome: Progressing Note:  No complaints of pain this shift   Problem: Safety: Goal: Ability to remain free from injury will improve Outcome: Progressing   Problem: Clinical Measurements: Goal: Ability to maintain a body temperature in the normal range will improve Outcome: Progressing

## 2019-01-13 NOTE — Plan of Care (Signed)
  Problem: Health Behavior/Discharge Planning: Goal: Ability to manage health-related needs will improve Outcome: Progressing   Problem: Clinical Measurements: Goal: Ability to maintain clinical measurements within normal limits will improve Outcome: Progressing   Problem: Skin Integrity: Goal: Risk for impaired skin integrity will decrease Outcome: Progressing   Problem: Activity: Goal: Ability to tolerate increased activity will improve Outcome: Progressing Note:  Patient remains of HFNC, unable to wean to 2 liters. Patient prefers to have duoneb instead of the inhaler. Patient continues to have shortness of breath with exertion.

## 2019-01-14 ENCOUNTER — Inpatient Hospital Stay: Payer: Self-pay

## 2019-01-14 LAB — BASIC METABOLIC PANEL
Anion gap: 7 (ref 5–15)
BUN: 60 mg/dL — ABNORMAL HIGH (ref 8–23)
CO2: 28 mmol/L (ref 22–32)
Calcium: 8.9 mg/dL (ref 8.9–10.3)
Chloride: 107 mmol/L (ref 98–111)
Creatinine, Ser: 1.59 mg/dL — ABNORMAL HIGH (ref 0.61–1.24)
GFR calc Af Amer: 49 mL/min — ABNORMAL LOW (ref >60)
GFR calc non Af Amer: 42 mL/min — ABNORMAL LOW (ref >60)
Glucose, Bld: 150 mg/dL — ABNORMAL HIGH (ref 70–99)
Potassium: 4.9 mmol/L (ref 3.5–5.1)
Sodium: 142 mmol/L (ref 135–145)

## 2019-01-14 LAB — GLUCOSE, CAPILLARY
Glucose-Capillary: 93 mg/dL (ref 70–99)
Glucose-Capillary: 145 mg/dL — ABNORMAL HIGH (ref 70–99)
Glucose-Capillary: 111 mg/dL — ABNORMAL HIGH (ref 70–99)
Glucose-Capillary: 171 mg/dL — ABNORMAL HIGH (ref 70–99)

## 2019-01-14 MED ORDER — METOPROLOL SUCCINATE ER 100 MG PO TB24
100.0000 mg | ORAL_TABLET | Freq: Every day | ORAL | Status: DC
  Administered 2019-01-14 – 2019-01-21 (×8): 100 mg via ORAL
  Filled 2019-01-14 (×8): qty 1

## 2019-01-14 MED ORDER — FUROSEMIDE 10 MG/ML IJ SOLN
80.0000 mg | Freq: Every day | INTRAMUSCULAR | Status: DC
  Administered 2019-01-14: 80 mg via INTRAVENOUS
  Filled 2019-01-14 (×2): qty 8

## 2019-01-14 MED ORDER — IPRATROPIUM-ALBUTEROL 0.5-2.5 (3) MG/3ML IN SOLN
3.0000 mL | Freq: Four times a day (QID) | RESPIRATORY_TRACT | Status: DC
  Administered 2019-01-14 – 2019-01-20 (×23): 3 mL via RESPIRATORY_TRACT
  Filled 2019-01-14 (×21): qty 3

## 2019-01-14 NOTE — Progress Notes (Addendum)
[facility] [place] NAME: Darius Taylor    MR#:  [id number]  DATE OF BIRTH:  Sep 18, 1946  SUBJECTIVE:   Patient is feeling better still on high flow 4  l of oxygen.  Reporting he is making good urine after Lasix dose increased  -935 in the past 24 hours and 10 L so far  Still endorsed SOB with minimal exertion.  Reports using 2 L of oxygen as needed at home prior to this admission  REVIEW OF SYSTEMS:    Review of Systems  Constitutional: Negative for fever, chills weight loss HENT: Negative for ear pain, nosebleeds, congestion, facial swelling, rhinorrhea, neck pain, neck stiffness and ear discharge.   Respiratory: no cough, ++IMPROVED shortness of breath, no wheezing  Cardiovascular: Negative for chest pain, palpitations and + MILD leg swelling.  Gastrointestinal: Negative for heartburn, abdominal pain, vomiting, diarrhea or consitpation Genitourinary: Negative for dysuria, urgency, frequency, hematuria Musculoskeletal: Negative for back pain or joint pain Neurological: Negative for dizziness, seizures, syncope, focal weakness,  numbness and headaches.  Hematological: Does not bruise/bleed easily.  Psychiatric/Behavioral: Negative for hallucinations, confusion, dysphoric mood    Tolerating Diet: yes      DRUG ALLERGIES:  No Known Allergies  VITALS:  Blood pressure 131/89, pulse 66, temperature 98.2 F (36.8 C), temperature source Oral, resp. rate 19, height 5\' 11"  (1.803 m), weight 102.2 kg, SpO2 93 %.  PHYSICAL EXAMINATION:  Constitutional: Appears well-developed and well-nourished. No distress. HENT: Normocephalic. . Eyes: Conjunctivae and EOM are normal. PERRLA, no scleral icterus.  Neck: Normal ROM. Neck supple. No JVD. No tracheal deviation. CVS: RRR, S1/S2 +, 3/6  murmurs, no gallops, no carotid bruit.  Pulmonary: Normal respiratory effort with some mild rhonchi right upper lobe.   Abdominal: Soft. BS +,  no distension,  tenderness, rebound or guarding.  Musculoskeletal: Normal range of motion. 1+ LE edema and no tenderness.  Neuro: Alert. CN 2-12 grossly intact. No focal deficits. Skin: Skin is warm and dry. No rash noted. Psychiatric: Normal mood and affect.      LABORATORY PANEL:   CBC Recent Labs  Lab 01/12/19 0549  WBC 14.5*  HGB 8.2*  HCT 27.0*  PLT 385   ------------------------------------------------------------------------------------------------------------------  Chemistries  Recent Labs  Lab 01/14/19 0352  NA 142  K 4.9  CL 107  CO2 28  GLUCOSE 150*  BUN 60*  CREATININE 1.59*  CALCIUM 8.9   ------------------------------------------------------------------------------------------------------------------  Cardiac Enzymes No results for input(s): TROPONINI in the last 168 hours.------------------------------------------------------------------------------------------------------------------  RADIOLOGY:  No results found.   ASSESSMENT AND PLAN:   73 year old male with a history of PAF, EtOH abuse and COPD who presented to the emergency room due to generalized weakness and found to have elevated creatinine.   1.  Acute on chronic kidney disease stage III due to EtOH abuse and dehydration: Creatinine has improved  . 2.  Acute on chronic hypoxic respiratory failure: Patient was transferred to ICU and placed on BiPAP and then transferred out of ICU however back to stepdown unit 4/13 due to increasing shortness of breath and hypoxia. Acute hypoxic respiratory failure with worsening O2 requirements due to right upper lobe/left lower lobe hospital-acquired pneumonia and acute on chronic diastolic heart failure.   Increase Lasix dose as patient still has significant lower extremity edema and bilateral rales and rhonchi.  Fluid restriction to 1200 cc/day Still on high flow 4  L will try to wean off.  Patient uses 2 L of oxygen  at home as needed  Appreciate pulmonology  follow-up Patient received cefepime and vancomycin for 7 days total.  Completed antibiotic course Patient has tested negative for COVID-19.  Respiratory viral panel was also negative. MRSA PCR was negative.  IV albumin will be continued    3.  Acute on chronic diastolic heart failure:  Continue IV Lasix Monitor intake and output  4.  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  Increasing Lasix dose to 80 mg iv a day  .  Continue close monitoring and avoid nephrotoxins  5.  PAF: Patient currently in sinus rhythm Continue Eliquis.  Resume home medication amiodarone  6.  COPD without signs of exacerbation: Continue nebs  7.  Essential hypertension: Continue Norvasc  8.  Anemia of chronic disease with stable hemoglobin at 7.9 today.     Management plans discussed with the patient and he is in agreement.  CODE STATUS: full  TOTAL TIME TAKING CARE OF THIS PATIENT: 33 minutes.     POSSIBLE D/C tomorrow with HHC if we can get O2 down.   Nicholes Mango M.D on 01/14/2019 at 11:12 AM  Between 7am to 6pm - Pager - [phone] After 6pm go to [email or web address] - password [email or web address] [facility]  Office  [phone]  CC: Primary care physician; [employer]  Note: This dictation was prepared with Diplomatic Services operational officer dictation along with smaller Company secretary. Any transcriptional errors that result from this process are unintentional.

## 2019-01-14 NOTE — TOC Progression Note (Signed)
Transition of Care [facility]) - Progression Note    Patient Details  Name: Darius Taylor MRN: [id number] Date of Birth: [birth year]  [facility] of [facility]) CM/SW Contact  Ross Ludwig, [place] Phone Number: 01/14/2019, 4:53 PM  Clinical Narrative:     CSW continuing to follow patient's progress throughout discharge planning.  Patient is still planning to return back home with home health.   Expected Discharge Plan: [place] Barriers to Discharge: Continued Medical Work up  Expected Discharge Plan and Services Expected Discharge Plan: [place]   Discharge Planning Services: CM Consult Post Acute Care Choice: [place] arrangements for the past 2 months: Single Family Home                     HH Arranged: RN, PT, Nurse's Aide, Social Work [employer] Agency: [employer] (now [facility] ([place]) Interventions    Readmission Risk Interventions Readmission Risk Prevention Plan 01/05/2019  Transportation Screening Complete  Medication Review Press photographer) Complete  PCP or Specialist appointment within 3-5 days of discharge Complete  HRI or [place] Complete  SW Recovery Care/Counseling Consult Patient refused  Palliative Care Screening Not [place] Not Applicable  Some recent data might be hidden

## 2019-01-14 NOTE — Progress Notes (Signed)
Physical Therapy Treatment Patient Details Name: Darius Taylor MRN: [id number] DOB: 03/27/1946 Today's Date: 01/14/2019    History of Present Illness 73 y.o. male with a known history of atrial fibrillation on eliquis, CAD, nonischemic cardiomyopathy with last known EF of 25% status post AICD and pacemaker, CKD, history of colonic adenocarcinoma status post partial colectomy, history of renal cell carcinoma status post left nephrectomy, COPD on as needed home oxygen presented to the hospital secondary to worsening ED with a complaint of weakness and fatigue.  He was found here to have an elevated creatinine he has elevated troponin and BNP.  While hospitalized he had increased respiratory distress and needed to be transferred to CCU, possible covid-19 suspected, but testing was negative.    PT Comments    Patient is continuing to demonstrate safety and ind with mobility with PT needed to monitor O2 sats. Patient is able to ambulate 97ft on 8L and subsequent 35ft on 6L with O2 remaining above 88%. Once patient returned to room he had difficulty urinating and began breathing through his mouth causing O2 to drop to 84% which he is able to quickly elevate with PLB and time. Patient requires demo and cuing for therex technique. Throughout standing therex patient is able to remain standing for rest breaks and maintain O2 above 90% on 6L. Following therex, when patient sits EOB, O2 sats to 74%, which quickly elevates to 88% following increasing O2 to 10L and with PLB. Above to lower O2 to 6L with sats 92% before leaving. Would benefit from skilled PT to address above deficits and promote optimal return to PLOF    Follow Up Recommendations  Home health PT;Supervision - Intermittent     Equipment Recommendations  None recommended by PT    Recommendations for Other Services       Precautions / Restrictions      Mobility  Bed Mobility Overal bed mobility: Independent             General bed  mobility comments: Patient able to supine > sit ind   Transfers Overall transfer level: Independent Equipment used: Rolling walker (2 wheeled)             General transfer comment: Patient did well standing with RW wtih good safety ind  Ambulation/Gait Ambulation/Gait assistance: Supervision Gait Distance (Feet): 100 Feet Assistive device: Rolling walker (2 wheeled) Gait Pattern/deviations: Narrow base of support Gait velocity: decreased   General Gait Details: good confidence with ambulation. Able to ambulate 64ft with O2 on 8L with O2 above 90%, and subsequent 76ft with 6L O2 with O2 above 89%.  which he is able to elevate to 91% following rest and PLB    Stairs             Wheelchair Mobility    Modified Rankin (Stroke Patients Only)       Balance                                            Cognition                                              Exercises General Exercises - Lower Extremity Hip ABduction/ADduction: Standing;Strengthening;10 reps;Both Hip Flexion/Marching: Strengthening;Both;10 reps;Standing Heel Raises: 10 reps;Standing;Both;Strengthening  Mini-Sqauts: 10 reps;Standing;Strengthening;Both Other Exercises Other Exercises: Patient is continuing to demonstrate more ind with STS transfers and ambulation with good safety technique. Patient is able to ambulate 46ft on 8L O2 with PT monitoring sats above 90%, PT adjusted O2 to 6L for remaining 50% and encouraged proper breathing and patient was able to maintain O2 above 88%.  Other Exercises: Patient able to complete therex with accuracy following cuing for technique and eccentric control. During standing therex patient took standing breaks between and his O2 sats stayed above 90%. Following therex, when patient sat down, his O2 dropped to 73% and quickly elevated to 88% with O2 increased to 10L and PLB. Once O2 in the 90s O2 was adjusted to 6L where patient continued  to sat above 90%, left sitting EOB    General Comments        Pertinent Vitals/Pain Pain Assessment: 0-10 Pain Score: 4  Pain Location: Pain with urination Pain Descriptors / Indicators: Burning    Home Living                      Prior Function            PT Goals (current goals can now be found in the care plan section) Acute Rehab PT Goals Patient Stated Goal: go home... but not until he's feeling better PT Goal Formulation: With patient Time For Goal Achievement: 01/23/19 Potential to Achieve Goals: Good Progress towards PT goals: Progressing toward goals    Frequency    Min 2X/week      PT Plan Current plan remains appropriate    Co-evaluation              AM-PAC PT "6 Clicks" Mobility   Outcome Measure  Help needed turning from your back to your side while in a flat bed without using bedrails?: None Help needed moving from lying on your back to sitting on the side of a flat bed without using bedrails?: None Help needed moving to and from a bed to a chair (including a wheelchair)?: None Help needed standing up from a chair using your arms (e.g., wheelchair or bedside chair)?: None Help needed to walk in hospital room?: A Little Help needed climbing 3-5 steps with a railing? : A Little 6 Click Score: 22    End of Session Equipment Utilized During Treatment: Gait belt;Oxygen Activity Tolerance: Patient limited by fatigue;Patient tolerated treatment well Patient left: with chair alarm set;with call bell/phone within reach;with nursing/sitter in room Nurse Communication: Mobility status PT Visit Diagnosis: Muscle weakness (generalized) (M62.81);Difficulty in walking, not elsewhere classified (R26.2)     Time: 4827-0786 PT Time Calculation (min) (ACUTE ONLY): 24 min  Charges:  $Therapeutic Exercise: 8-22 mins $Therapeutic Activity: 8-22 mins                     Shelton Silvas PT, DPT  Shelton Silvas 01/14/2019, 10:46 AM

## 2019-01-14 NOTE — Progress Notes (Signed)
Patient complained of straining and pain with urination. Bladder scan revealed 16cc. Pulled back foreskin without resistance patient denied any pain.

## 2019-01-14 NOTE — Progress Notes (Signed)
Patient declined Bipap at this time.

## 2019-01-14 NOTE — Plan of Care (Signed)
Nutrition Education Note  RD consulted for nutrition education regarding CHF.  Spoke with pt via phone  RD provided via mail "Low Sodium Nutrition Therapy" handout from the Academy of [employer]etics. Reviewed patient's dietary recall. Provided examples on ways to decrease sodium intake in diet. Discouraged intake of processed foods and use of salt shaker. Encouraged fresh fruits and vegetables as well as whole grain sources of carbohydrates to maximize fiber intake.   RD discussed why it is important for patient to adhere to diet recommendations, and emphasized the role of fluid restrictions, foods to avoid, and importance of weighing self daily. Teach back method used.  Expect poor compliance.  Body mass index is 31.42 kg/m. Pt meets criteria for obesity based on current BMI.  RD following this patient  Koleen Distance MS, RD, LDN Pager #- [phone] Office#- [phone] After Hours Pager: [phone]

## 2019-01-14 NOTE — Progress Notes (Signed)
Pulmonary Medicine          Date: 01/14/2019,   MRN# [id number] Darius Taylor 12/18/1945     AdmissionWeight: 99.8 kg                 CurrentWeight: 102.2 kg         SUBJECTIVE    Patient reports doing better this am.  I have weaned down his O2 to 4L/min [place].  Total net neg >10L   PAST MEDICAL HISTORY   Past Medical History:  Diagnosis Date   Aortic stenosis    Arrhythmia    atrial fibrillation   Asthma    Atrial fibrillation (HCC)    CAD (coronary artery disease)    Cancer (HCC)    colon, bladder and kidney   CHF (congestive heart failure) (HCC)    Colon adenocarcinoma (HCC)    COPD (chronic obstructive pulmonary disease) ([place])    H/O ventricular tachycardia    Hypertension    Nonischemic cardiomyopathy ([place])    Status post AICD and pacemaker   Renal cell carcinoma ([place])      SURGICAL HISTORY   Past Surgical History:  Procedure Laterality Date   CARDIAC DEFIBRILLATOR PLACEMENT     CARDIAC DEFIBRILLATOR PLACEMENT     COLONOSCOPY WITH PROPOFOL N/A 06/10/2018   Procedure: COLONOSCOPY WITH PROPOFOL;  Surgeon: Lin Landsman, MD;  Location: [facility];  Service: Gastroenterology;  Laterality: N/A;   CORONARY ANGIOPLASTY  09/25/2016   IRRIGATION AND DEBRIDEMENT HEMATOMA Left 10/24/2016   Procedure: IRRIGATION AND DEBRIDEMENT HEMATOMA;  Surgeon: Florene Glen, MD;  Location: [facility] OR[facility];  Service: General;  Laterality: Left;   KIDNEY SURGERY     left ne[hrectomy for RCC   PACEMAKER IMPLANT     PARTIAL COLECTOMY       FAMILY HISTORY   Family History  Problem Relation Age of Onset   Hypertension Mother    Stroke Father      SOCIAL HISTORY   Social History   Tobacco Use   Smoking status: Former Smoker   Smokeless tobacco: Never Used  Substance Use Topics   Alcohol use: No   Drug use: No     MEDICATIONS    Home Medication:    Current Medication:  Current Facility-Administered Medications:      0.9 %  sodium chloride infusion, , Intravenous, PRN, Gouru, Aruna, MD, Stopped at 01/11/19 0047   acetaminophen (TYLENOL) tablet 650 mg, 650 mg, Oral, Q6H PRN, 650 mg at 01/11/19 0451 **OR** acetaminophen (TYLENOL) suppository 650 mg, 650 mg, Rectal, Q6H PRN, Lance Coon, MD   albuterol (PROVENTIL HFA;VENTOLIN HFA) 108 (90 Base) MCG/ACT inhaler 1-2 puff, 1-2 puff, Inhalation, Q4H PRN, Gouru, Aruna, MD, 2 puff at 01/05/19 1403   albuterol (PROVENTIL) (2.5 MG/3ML) 0.083% nebulizer solution 2.5 mg, 2.5 mg, Nebulization, Q4H PRN, Gouru, Aruna, MD, 2.5 mg at 01/14/19 0502   amiodarone (PACERONE) tablet 200 mg, 200 mg, Oral, Daily, Gouru, Aruna, MD, 200 mg at 01/13/19 1100   amLODipine (NORVASC) tablet 10 mg, 10 mg, Oral, Daily, Lance Coon, MD, 10 mg at 01/13/19 0817   apixaban (ELIQUIS) tablet 5 mg, 5 mg, Oral, BID, Lance Coon, MD, 5 mg at 01/13/19 2019   aspirin chewable tablet 81 mg, 81 mg, Oral, Daily, Lance Coon, MD, 81 mg at 01/13/19 0817   docusate sodium (COLACE) capsule 100 mg, 100 mg, Oral, Daily, Fritzi Mandes, MD, 100 mg at 01/12/19 0859   feeding supplement (NEPRO CARB STEADY) liquid  237 mL, 237 mL, Oral, Q24H, Gouru, Aruna, MD, 237 mL at 37/90/24 0973   folic acid (FOLVITE) tablet 1 mg, 1 mg, Oral, Daily, Lance Coon, MD, 1 mg at 01/13/19 0816   furosemide (LASIX) injection 80 mg, 80 mg, Intravenous, Daily, Coby Antrobus, MD   guaiFENesin-dextromethorphan (ROBITUSSIN DM) 100-10 MG/5ML syrup 5 mL, 5 mL, Oral, Q4H PRN, Gouru, Aruna, MD, 5 mL at 01/02/19 0618   insulin aspart (novoLOG) injection 0-15 Units, 0-15 Units, Subcutaneous, TID WC, Wilhelmina Mcardle, MD, 3 Units at 01/13/19 1651   insulin aspart (novoLOG) injection 0-5 Units, 0-5 Units, Subcutaneous, QHS, Wilhelmina Mcardle, MD, 2 Units at 01/11/19 2133   Ipratropium-Albuterol (COMBIVENT) respimat 1 puff, 1 puff, Inhalation, Q6H, Gouru, Aruna, MD, 1 puff at 01/13/19 1652   magnesium oxide (MAG-OX) tablet  400 mg, 400 mg, Oral, BID, Lance Coon, MD, 400 mg at 01/13/19 2018   MEDLINE mouth rinse, 15 mL, Mouth Rinse, BID, Wilhelmina Mcardle, MD, 15 mL at 01/13/19 2121   metoprolol succinate (TOPROL-XL) 24 hr tablet 100 mg, 100 mg, Oral, Daily, Ottie Glazier, MD   multivitamin with minerals tablet 1 tablet, 1 tablet, Oral, Daily, Lance Coon, MD, 1 tablet at 01/13/19 0816   [DISCONTINUED] ondansetron [facility]) tablet 4 mg, 4 mg, Oral, Q6H PRN, 4 mg at 01/03/19 1407 **OR** ondansetron (ZOFRAN) injection 4 mg, 4 mg, Intravenous, Q6H PRN, Lance Coon, MD, 4 mg at 01/12/19 0421   potassium chloride 20 MEQ/15ML (10%) solution 20 mEq, 20 mEq, Oral, Daily, Mody, Sital, MD, 20 mEq at 01/13/19 1100   sodium chloride flush (NS) 0.9 % injection 10 mL, 10 mL, Intravenous, Q12H, Gouru, Aruna, MD, 10 mL at 01/13/19 2121   thiamine (VITAMIN B-1) tablet 100 mg, 100 mg, Oral, Daily, 100 mg at 01/13/19 0817 **OR** [DISCONTINUED] thiamine (B-1) injection 100 mg, 100 mg, Intravenous, Daily, Lance Coon, MD, 100 mg at 01/04/19 0908    ALLERGIES   Patient has no known allergies.     REVIEW OF SYSTEMS    Review of Systems:  Gen:  Denies  fever, sweats, chills weigh loss  HEENT: Denies blurred vision, double vision, ear pain, eye pain, hearing loss, nose bleeds, sore throat Cardiac:  No dizziness, chest pain or heaviness, chest tightness,edema Resp:   Denies cough or sputum porduction, shortness of breath,wheezing, hemoptysis,  Gi: Denies swallowing difficulty, stomach pain, nausea or vomiting, diarrhea, constipation, bowel incontinence Gu:  Denies bladder incontinence, burning urine Ext:   Denies Joint pain, stiffness or swelling Skin: Denies  skin rash, easy bruising or bleeding or hives Endoc:  Denies polyuria, polydipsia , polyphagia or weight change Psych:   Denies depression, insomnia or hallucinations   Other:  All other systems negative   VS: BP 131/89 (BP Location: Right Arm)    Pulse  66    Temp 98.2 F (36.8 C) (Oral)    Resp 19    Ht 5\' 11"  (1.803 m)    Wt 102.2 kg    SpO2 96%    BMI 31.42 kg/m      PHYSICAL EXAM    GENERAL:NAD, no fevers, chills, no weakness no fatigue HEAD: Normocephalic, atraumatic.  EYES: Pupils equal, round, reactive to light. Extraocular muscles intact. No scleral icterus.  MOUTH: Moist mucosal membrane. Dentition intact. No abscess noted.  EAR, NOSE, THROAT: Clear without exudates. No external lesions.  NECK: Supple. No thyromegaly. No nodules. No JVD.  PULMONARY: Diffuse coarse rhonchi right sided +wheezes CARDIOVASCULAR: S1 and S2. Regular rate  and rhythm. No murmurs, rubs, or gallops. No edema. Pedal pulses 2+ bilaterally.  GASTROINTESTINAL: Soft, nontender, nondistended. No masses. Positive bowel sounds. No hepatosplenomegaly.  MUSCULOSKELETAL: No swelling, clubbing, or edema. Range of motion full in all extremities.  NEUROLOGIC: Cranial nerves II through XII are intact. No gross focal neurological deficits. Sensation intact. Reflexes intact.  SKIN: No ulceration, lesions, rashes, or cyanosis. Skin warm and dry. Turgor intact.  PSYCHIATRIC: Mood, affect within normal limits. The patient is awake, alert and oriented x 3. Insight, judgment intact.       IMAGING    Dg Chest 1 View  Result Date: 01/10/2019 CLINICAL DATA:  Pneumonia. Hx of asthma, A-fib, CAD CHF, COPD, HTN. EXAM: CHEST  1 VIEW COMPARISON:  01/09/19 FINDINGS: LEFT-sided pacemaker overlies stable enlarged cardiac silhouette. The diffuse lobar airspace disease in the RIGHT upper lobe. Mild airspace disease in the RIGHT lower lobe. No focal consolidation. No pneumothorax. Chronic elevation LEFT hemidiaphragm. IMPRESSION: 1. No significant interval change. 2. RIGHT lung asymmetric airspace disease representing asymmetric edema versus asymmetric pulmonary infection. Electronically Signed   By: Suzy Bouchard M.D.   On: 01/10/2019 07:50   Dg Chest 1 View  Result Date:  12/31/2018 CLINICAL DATA:  SOB. EXAM: CHEST  1 VIEW COMPARISON:  12/30/2018. FINDINGS: Marked cardiac enlargement. Dual lead pacer/AICD. Mild vascular congestion. No overt edema. Chronic elevation LEFT hemidiaphragm. No pneumothorax. Aortic atherosclerosis. IMPRESSION: Cardiomegaly. Mild vascular congestion without overt edema. Stable appearance from priors. Electronically Signed   By: Staci Righter M.D.   On: 12/31/2018 08:20   Dg Abd 1 View  Result Date: 01/05/2019 CLINICAL DATA:  Nausea and vomiting EXAM: ABDOMEN - 1 VIEW COMPARISON:  09/28/2016 FINDINGS: There is a large amount of stool in the ascending and transverse colon. The bowel gas pattern is normal. No radio-opaque calculi or other significant radiographic abnormality are seen. Mild osteoarthritis of the right hip. IMPRESSION: Large amount of stool in the ascending and transverse colon. Electronically Signed   By: Kathreen Devoid   On: 01/05/2019 19:42   Ct Chest Wo Contrast  Result Date: 01/02/2019 CLINICAL DATA:  Dyspnea.  History of colon cancer. EXAM: CT CHEST WITHOUT CONTRAST TECHNIQUE: Multidetector CT imaging of the chest was performed following the standard protocol without IV contrast. COMPARISON:  Radiographs of December 31, 2018. CT scan of November 10, 2018. FINDINGS: Cardiovascular: Atherosclerosis of thoracic aorta is noted without aneurysm formation. Mild cardiomegaly is noted. Status post transcatheter aortic valve replacement. Coronary artery calcifications are noted. Mediastinum/Nodes: No enlarged mediastinal or axillary lymph nodes. Thyroid gland, trachea, and esophagus demonstrate no significant findings. Lungs/Pleura: No pneumothorax or pleural effusion is noted. Moderate left lower lobe subsegmental atelectasis or pneumonia is noted which is increased compared to prior exam. Mild right posterior basilar subsegmental atelectasis or inflammation is noted posteriorly in right lower lobe. New ground-glass opacity seen posteriorly in the  right upper lobe concerning for pneumonia. Upper Abdomen: No acute abnormality. Musculoskeletal: No chest wall mass or suspicious bone lesions identified. IMPRESSION: New ground-glass opacity seen posteriorly in right upper lobe concerning for pneumonia; this may represent typical or atypical infection, including viral etiology. Also noted are increased bilateral lower lobe opacities concerning for worsening atelectasis or pneumonia, left worse than right. Coronary artery calcifications are noted. Aortic Atherosclerosis (ICD10-I70.0). Electronically Signed   By: Marijo Conception, M.D.   On: 01/02/2019 16:18   Dg Chest Port 1 View  Result Date: 01/09/2019 CLINICAL DATA:  Acute respiratory failure. EXAM: PORTABLE CHEST 1  VIEW COMPARISON:  Radiographs 01/08/2019 and 01/06/2019.  CT 01/02/2019. FINDINGS: 0937 hours. The left subclavian pacemaker leads appear stable. There is stable cardiomegaly and aortic atherosclerosis post TAVR. They were stable asymmetric airspace opacities in the right upper lobe and left lung base. No pneumothorax or significant pleural effusion. The bones appear unchanged. IMPRESSION: No significant change in asymmetric bilateral airspace opacities, presumably infectious/inflammatory. Stable cardiomegaly. Electronically Signed   By: Richardean Sale M.D.   On: 01/09/2019 10:24   Dg Chest Port 1 View  Result Date: 01/08/2019 CLINICAL DATA:  Respiratory failure. EXAM: PORTABLE CHEST 1 VIEW COMPARISON:  One-view chest x-ray 01/06/2019 FINDINGS: AND: FINDINGS: AND Heart is enlarged. Aortic valve replacement is noted. Atherosclerotic changes are present in the aorta. Pacing and defibrillator wires are stable. There is improving aeration of both lungs. Right upper lobe and left lower lobe airspace consolidation remain. Mild pulmonary vascular congestion is noted. IMPRESSION: 1. Slight improved aeration of both lungs with persistent right upper lobe and left lower lobe pneumonia. 2. Cardiomegaly  with mild respiratory changes. Electronically Signed   By: San Morelle M.D.   On: 01/08/2019 06:40   Dg Chest Port 1 View  Result Date: 01/06/2019 CLINICAL DATA:  Respiratory failure. EXAM: PORTABLE CHEST 1 VIEW COMPARISON:  Chest x-ray from yesterday. FINDINGS: Unchanged left chest wall AICD. Stable cardiomegaly status post TAVR. Atherosclerotic calcification of the aortic arch. Unchanged pulmonary vascular congestion and interstitial thickening. Increased patchy opacity in the right upper lobe. Unchanged left greater than right bibasilar atelectasis. No large pleural effusion. No pneumothorax. No acute osseous abnormality. IMPRESSION: 1. Increasing patchy opacity in the right upper lobe, concerning for pneumonia. 2. Unchanged mild pulmonary interstitial edema and bibasilar atelectasis. Electronically Signed   By: Titus Dubin M.D.   On: 01/06/2019 11:14   Dg Chest Port 1 View  Result Date: 01/05/2019 CLINICAL DATA:  Respiratory failure. EXAM: PORTABLE CHEST 1 VIEW COMPARISON:  CT of the chest on 01/02/2019 and chest x-ray on 12/31/2018 FINDINGS: Stable cardiac enlargement, appearance of pacing/ICD device and appearance transcatheter aortic valve. Lungs show subtle opacities in the right upper lobe and both lung bases potentially representing pneumonia. There also may be a component of mild pulmonary interstitial edema and bibasilar atelectasis. No significant pleural effusion. No pneumothorax. IMPRESSION: Subtle opacities in the right upper lobe and both lung bases potentially representing multifocal pneumonia. There also may be a component of mild pulmonary interstitial edema and bibasilar atelectasis. Electronically Signed   By: Aletta Edouard M.D.   On: 01/05/2019 08:37   Dg Chest Port 1 View  Result Date: 12/30/2018 CLINICAL DATA:  Progressive shortness of breath. EXAM: PORTABLE CHEST 1 VIEW COMPARISON:  Radiograph yesterday. CT 11/10/2018 FINDINGS: Similar cardiomegaly. Unchanged  mediastinal contours. Aortic atherosclerosis with aortic root replacement. Left-sided pacemaker remains in place. Vascular congestion without overt pulmonary edema. Mild elevation of left hemidiaphragm. Improved atelectasis in the left mid lung. No large pleural effusion. No pneumothorax. Unchanged osseous structures. IMPRESSION: Cardiomegaly with vascular congestion. No overt pulmonary edema. Improved left midlung atelectasis. Aortic Atherosclerosis (ICD10-I70.0). Electronically Signed   By: Keith Rake M.D.   On: 12/30/2018 02:01   Dg Chest Portable 1 View  Result Date: 12/29/2018 CLINICAL DATA:  Weakness with apparent hypotension EXAM: PORTABLE CHEST 1 VIEW COMPARISON:  Chest radiograph November 05, 2018 and chest CT November 10, 2018 FINDINGS: There is atelectatic change in the left mid lung. There is no appreciable edema or consolidation. Heart is mildly enlarged with pulmonary vascularity normal. Pacemaker  leads are attached the right atrium and right ventricle. There is aortic atherosclerosis. No adenopathy. No bone lesions. There is calcification in each carotid artery. IMPRESSION: Left midlung atelectasis. No edema or consolidation. Mild cardiomegaly. Pacemaker leads attached to right atrium and right ventricle. Aortic Atherosclerosis (ICD10-I70.0). Bilateral carotid artery calcification noted. Electronically Signed   By: Lowella Grip III M.D.   On: 12/29/2018 21:14      ASSESSMENT/PLAN   Acute on chronic hypoxemic respiratory failure        -acute decompensated diastolic CHF exacerbation -                     -Continues to have significant lower extremity edema and crackles on auscultation at the bases-patient drinking copious amounts of water-we will place on 1200 cc fluid restriction                    -Patient status post left nephrectomy in 2000 and-creatinine is at baseline will increase Lasix to 80 mg twice daily with daily albumin to improve efficacy       -Bibasilar  atelectasis -have reviewed with patient appropriate incentive spirometry and Acapella device usage, encourage use each hour multiple times              -Please perform exertional walk to delineate amount of oxygen needed to keep SPO2 over 88%-in preparation to discharge home on increased oxygen       -Febrile episode-possible pneumonia status post antibiotic treatment completed therapy   - will need outpatient workup for pulmonary hypertension via RHC - RVSP was significantly elevated on TTE will discuss with cardiology - appreciate input   -appreciate physical therapy input      COPD -continue nebulizer therapy - IS/ Chest PT - no need for steroids at this time       Thank you for allowing me to participate in the care of this patient.   Patient/Family are satisfied with care plan and all questions have been answered.  This document was prepared using Dragon voice recognition software and may include unintentional dictation errors.     Ottie Glazier, M.D.  Division of [place]

## 2019-01-14 NOTE — Progress Notes (Signed)
Patient got up to the commode, washed up at the sink and then walked to the chair. His 02 sats dropped to 80%.  Came right back up when he stopped the activity.  He says that's what happened at home when PT comes to work with him.  When his sats drop to 80%, they stop and it comes right back up, then they continue.

## 2019-01-14 NOTE — Consult Note (Signed)
[facility] FAILURE PHARMACIST COUNSELING NOTE  ADHERENCE ASSESSMENT Patient states he is able to take his medications on time and has no problem getting his medications.   Guideline-Directed Medical Therapy/Evidence Based Medicine  ACE/ARB/ARNI: None Beta Blocker: Metoprolol Succinate 200 mg daily changed to 100 mg daily  Aldosterone Antagonist: Spironolactone 12.5 mg - currently holding  Diuretic: Furosemide 40 mg twice daily - currently still received Lasix 80 mg IV daily     SUBJECTIVE   HPI: Presented to the ED with weakness. He was found here to have an elevated creatinine.  He has elevated troponin and BNP as well, but these are at baseline chronic elevation.  Patient's alcohol level is also elevated, though he cannot give a clear indication of how much she has had to drink recently.  He was treated with IV fluids in the ED in the hopes of improving his creatinine, though it did not improve. Patient still endorsed SOB but is slowly improving during hospital stay.   Past Medical History:  Diagnosis Date  . Aortic stenosis   . Arrhythmia    atrial fibrillation  . Asthma   . Atrial fibrillation ([place])   . CAD (coronary artery disease)   . Cancer [facility])    colon, bladder and kidney  . CHF (congestive heart failure) ([place])   . 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 COPD (chronic obstructive pulmonary disease) ([place])   . H/O ventricular tachycardia   . Hypertension   . Nonischemic cardiomyopathy ([place])    Status post AICD and pacemaker  . Renal cell carcinoma ([facility])     OBJECTIVE    Vital signs: HR 60s, BP 130s/40-80s, weight (pounds) 225 lb  ECHO: Date 11/04/2018, EF 50-55,  BMP Latest Ref Rng & Units 01/14/2019 01/12/2019 01/11/2019  Glucose 70 - 99 mg/dL 150(H) 135(H) 123(H)  BUN 8 - 23 mg/dL 60(H) 62(H) 69(H)  Creatinine 0.61 - 1.24 mg/dL 1.59(H) 1.43(H) 1.51(H)  Sodium 135 - 145 mmol/L 142 141 136  Potassium 3.5 - 5.1 mmol/L 4.9 4.3 4.0  Chloride 98 -  111 mmol/L 107 107 103  CO2 22 - 32 mmol/L 28 29 27   Calcium 8.9 - 10.3 mg/dL 8.9 9.0 9.0    ASSESSMENT Patient is improving on IV lasix, will need to switch to PO prior to discharge. Patient stated he understands his diagnosis. And the importance of medication adherence. We are currently holding his spironolactone. Creatinine seems to be at baseline. Recommend using spironolactone in caution if Scr > 2.5.    PLAN No current changes to be made. Patient understands to check blood pressure and do daily weights.     Time spent: 15 minutes  Oswald Hillock, Pharm.D, BCPS Clinical Pharmacist 01/14/2019 11:43 AM    Current Facility-Administered Medications:  .  0.9 %  sodium chloride infusion, , Intravenous, PRN, Nicholes Mango, MD, Stopped at 01/11/19 0047 .  acetaminophen (TYLENOL) tablet 650 mg, 650 mg, Oral, Q6H PRN, 650 mg at 01/11/19 0451 **OR** acetaminophen (TYLENOL) suppository 650 mg, 650 mg, Rectal, Q6H PRN, Lance Coon, MD .  albuterol (PROVENTIL) (2.5 MG/3ML) 0.083% nebulizer solution 2.5 mg, 2.5 mg, Nebulization, Q4H PRN, Gouru, Aruna, MD, 2.5 mg at 01/14/19 0502 .  amiodarone (PACERONE) tablet 200 mg, 200 mg, Oral, Daily, Gouru, Aruna, MD, 200 mg at 01/14/19 0851 .  amLODipine (NORVASC) tablet 10 mg, 10 mg, Oral, Daily, Lance Coon, MD, 10 mg at 01/14/19 [phone] .  apixaban (ELIQUIS) tablet 5 mg, 5 mg, Oral,  BID, Lance Coon, MD, 5 mg at 01/14/19 4287 .  aspirin chewable tablet 81 mg, 81 mg, Oral, Daily, Lance Coon, MD, 81 mg at 01/14/19 0851 .  docusate sodium (COLACE) capsule 100 mg, 100 mg, Oral, Daily, Fritzi Mandes, MD, 100 mg at 01/14/19 0849 .  feeding supplement (NEPRO CARB STEADY) liquid 237 mL, 237 mL, Oral, Q24H, Gouru, Aruna, MD, 237 mL at 01/13/19 1233 .  folic acid (FOLVITE) tablet 1 mg, 1 mg, Oral, Daily, Lance Coon, MD, 1 mg at 01/14/19 0849 .  furosemide (LASIX) injection 80 mg, 80 mg, Intravenous, Daily, Ottie Glazier, MD, 80 mg at 01/14/19 0857 .   guaiFENesin-dextromethorphan (ROBITUSSIN DM) 100-10 MG/5ML syrup 5 mL, 5 mL, Oral, Q4H PRN, Gouru, Aruna, MD, 5 mL at 01/02/19 0618 .  insulin aspart (novoLOG) injection 0-15 Units, 0-15 Units, Subcutaneous, TID WC, Wilhelmina Mcardle, MD, 3 Units at 01/13/19 1651 .  insulin aspart (novoLOG) injection 0-5 Units, 0-5 Units, Subcutaneous, QHS, Wilhelmina Mcardle, MD, 2 Units at 01/11/19 2133 .  Ipratropium-Albuterol (COMBIVENT) respimat 1 puff, 1 puff, Inhalation, Q6H, Gouru, Aruna, MD, 1 puff at 01/13/19 1652 .  ipratropium-albuterol (DUONEB) 0.5-2.5 (3) MG/3ML nebulizer solution 3 mL, 3 mL, Nebulization, Q6H, Gouru, Aruna, MD .  magnesium oxide (MAG-OX) tablet 400 mg, 400 mg, Oral, BID, Lance Coon, MD, 400 mg at 01/14/19 6811 .  MEDLINE mouth rinse, 15 mL, Mouth Rinse, BID, Wilhelmina Mcardle, MD, 15 mL at 01/14/19 0903 .  metoprolol succinate (TOPROL-XL) 24 hr tablet 100 mg, 100 mg, Oral, Daily, Aleskerov, Fuad, MD, 100 mg at 01/14/19 0849 .  multivitamin with minerals tablet 1 tablet, 1 tablet, Oral, Daily, Lance Coon, MD, 1 tablet at 01/14/19 [phone] .  [DISCONTINUED] ondansetron (ZOFRAN) tablet 4 mg, 4 mg, Oral, Q6H PRN, 4 mg at 01/03/19 1407 **OR** ondansetron (ZOFRAN) injection 4 mg, 4 mg, Intravenous, Q6H PRN, Lance Coon, MD, 4 mg at 01/12/19 0421 .  potassium chloride 20 MEQ/15ML (10%) solution 20 mEq, 20 mEq, Oral, Daily, Mody, Sital, MD, 20 mEq at 01/14/19 0858 .  sodium chloride flush (NS) 0.9 % injection 10 mL, 10 mL, Intravenous, Q12H, Gouru, Aruna, MD, 10 mL at 01/14/19 0858 .  thiamine (VITAMIN B-1) tablet 100 mg, 100 mg, Oral, Daily, 100 mg at 01/14/19 0851 **OR** [DISCONTINUED] thiamine (B-1) injection 100 mg, 100 mg, Intravenous, Daily, Lance Coon, MD, 100 mg at 01/04/19 0908   COUNSELING POINTS/CLINICAL PEARLS Metoprolol Succinate  Warn patient to avoid activities requiring mental alertness or coordination until drug effects are realized, as drug may cause dizziness. Tell  patient planning major surgery with anesthesia to alert physician that drug is being used, as drug impairs ability of heart to respond to reflex adrenergic stimuli. Drug may cause diarrhea, fatigue, headache, or depression. Advise diabetic patient to carefully monitor blood glucose as drug may mask symptoms of hypoglycemia. Patient should take extended-release tablet with or immediately following meals. Counsel patient against sudden discontinuation of drug, as this may precipitate hypertension, angina, or myocardial infarction. In the event of a missed dose, counsel patient to skip the missed dose and maintain a regular dosing schedule. Furosemide  Drug causes sun-sensitivity. Advise patient to use sunscreen and avoid  tanning beds. Patient should avoid activities requiring coordination until drug effects are realized, as drug may cause dizziness, vertigo, or blurred vision. This drug may cause hyperglycemia, hyperuricemia, constipation, diarrhea, loss of appetite, nausea, vomiting, purpuric disorder, cramps, spasticity, asthenia, headache, paresthesia, or scaling eczema. Instruct patient to report unusual  bleeding/bruising or signs/symptoms of hypotension, infection, pancreatitis, or ototoxicity (tinnitus, hearing impairment). Advise patient to report signs/symptoms of a severe skin reactions (flu-like symptoms, spreading red rash, or skin/mucous membrane blistering) or erythema multiforme. Instruct patient to eat high-potassium foods during drug therapy, as  directed by healthcare professional.  Patient should not drink alcohol while taking this drug. Spironolactone  Warn patient to report dehydration, hypotension, or symptoms of  worsening renal function.  Counsel male patient to report gynecomastia.  Side effects may include diarrhea, nausea, vomiting, abdominal cramping, fever, leg cramps, lethargy, mental confusion, decreased libido, irregular menses, and rash. Suspension: Tell patient to  take drug consistently with respect to food,  either before or after a meal.  Advise patient to avoid potassium supplements and foods containing high levels of potassium, including salt substitutes.   DRUGS TO AVOID IN HEART FAILURE  Drug or Class Mechanism  Analgesics . NSAIDs . COX-2 inhibitors . Glucocorticoids  Sodium and water retention, increased systemic vascular resistance, decreased response to diuretics   Diabetes Medications . Metformin . Thiazolidinediones o Rosiglitazone (Avandia) o Pioglitazone (Actos) . DPP4 Inhibitors o Saxagliptin (Onglyza) o Sitagliptin (Januvia)   Lactic acidosis Possible calcium channel blockade   Unknown  Antiarrhythmics . Class I  o Flecainide o Disopyramide . Class III o Sotalol . Other o Dronedarone  Negative inotrope, proarrhythmic   Proarrhythmic, beta blockade  Negative inotrope  Antihypertensives . Alpha Blockers o Doxazosin . Calcium Channel Blockers o Diltiazem o Verapamil o Nifedipine . Central Alpha Adrenergics o Moxonidine . Peripheral Vasodilators o Minoxidil  Increases renin and aldosterone  Negative inotrope    Possible sympathetic withdrawal  Unknown  Anti-infective . Itraconazole . Amphotericin B  Negative inotrope Unknown  Hematologic . Anagrelide . Cilostazol   Possible inhibition of PD IV Inhibition of PD III causing arrhythmias  Neurologic/Psychiatric . Stimulants . Anti-Seizure Drugs o Carbamazepine o Pregabalin . Antidepressants o Tricyclics o Citalopram . Parkinsons o Bromocriptine o Pergolide o Pramipexole . Antipsychotics o Clozapine . Antimigraine o Ergotamine o Methysergide . Appetite suppressants . Bipolar o Lithium  Peripheral alpha and beta agonist activity  Negative inotrope and chronotrope Calcium channel blockade  Negative inotrope, proarrhythmic Dose-dependent QT prolongation  Excessive serotonin activity/valvular damage Excessive serotonin  activity/valvular damage Unknown  IgE mediated hypersensitivy, calcium channel blockade  Excessive serotonin activity/valvular damage Excessive serotonin activity/valvular damage Valvular damage  Direct myofibrillar degeneration, adrenergic stimulation  Antimalarials . Chloroquine . Hydroxychloroquine Intracellular inhibition of lysosomal enzymes  Urologic Agents . Alpha Blockers o Doxazosin o Prazosin o Tamsulosin o Terazosin  Increased renin and aldosterone  Adapted from Page RL, et al. "Drugs That May Cause or Exacerbate Heart Failure: A Scientific Statement from the [place]." Circulation 2016; 428:J68-T15. DOI: 10.1161/CIR.0000000000000426   MEDICATION ADHERENCES TIPS AND STRATEGIES 1. Taking medication as prescribed improves patient outcomes in heart failure (reduces hospitalizations, improves symptoms, increases survival) 2. Side effects of medications can be managed by decreasing doses, switching agents, stopping drugs, or adding additional therapy. Please let someone in the [facility] know if you have having bothersome side effects so we can modify your regimen. Do not alter your medication regimen without talking to [place].  3. Medication reminders can help patients remember to take drugs on time. If you are missing or forgetting doses you can try linking behaviors, using pill boxes, or an electronic reminder like an alarm on your phone or an app. Some people can also get automated phone calls as medication reminders.

## 2019-01-15 ENCOUNTER — Inpatient Hospital Stay: Payer: Medicare HMO

## 2019-01-15 LAB — CBC
HCT: 23.7 % — ABNORMAL LOW (ref 39.0–52.0)
Hemoglobin: 7.1 g/dL — ABNORMAL LOW (ref 13.0–17.0)
MCH: 29.8 pg (ref 26.0–34.0)
MCHC: 30 g/dL (ref 30.0–36.0)
MCV: 99.6 fL (ref 80.0–100.0)
Platelets: 325 10*3/uL (ref 150–400)
RBC: 2.38 MIL/uL — ABNORMAL LOW (ref 4.22–5.81)
RDW: 17.4 % — ABNORMAL HIGH (ref 11.5–15.5)
WBC: 14.7 10*3/uL — ABNORMAL HIGH (ref 4.0–10.5)
nRBC: 0.3 % — ABNORMAL HIGH (ref 0.0–0.2)

## 2019-01-15 LAB — GLUCOSE, CAPILLARY
Glucose-Capillary: 116 mg/dL — ABNORMAL HIGH (ref 70–99)
Glucose-Capillary: 144 mg/dL — ABNORMAL HIGH (ref 70–99)
Glucose-Capillary: 140 mg/dL — ABNORMAL HIGH (ref 70–99)
Glucose-Capillary: 137 mg/dL — ABNORMAL HIGH (ref 70–99)

## 2019-01-15 LAB — RETICULOCYTES
Immature Retic Fract: 33.5 % — ABNORMAL HIGH (ref 2.3–15.9)
RBC.: 2.36 MIL/uL — ABNORMAL LOW (ref 4.22–5.81)
Retic Count, Absolute: 159.8 10*3/uL (ref 19.0–186.0)
Retic Ct Pct: 6.8 % — ABNORMAL HIGH (ref 0.4–3.1)

## 2019-01-15 LAB — VITAMIN B12: Vitamin B-12: 767 pg/mL (ref 180–914)

## 2019-01-15 LAB — FOLATE: Folate: 11.2 ng/mL (ref >5.9)

## 2019-01-15 LAB — IRON AND TIBC
Iron: 62 ug/dL (ref 45–182)
Saturation Ratios: 17 % — ABNORMAL LOW (ref 17.9–39.5)
TIBC: 362 ug/dL (ref 250–450)
UIBC: 300 ug/dL

## 2019-01-15 LAB — FERRITIN: Ferritin: 137 ng/mL (ref 24–336)

## 2019-01-15 LAB — PSA: Prostatic Specific Antigen: 1.61 ng/mL (ref 0.00–4.00)

## 2019-01-15 MED ORDER — FUROSEMIDE 10 MG/ML IJ SOLN
80.0000 mg | Freq: Two times a day (BID) | INTRAMUSCULAR | Status: DC
  Administered 2019-01-15 – 2019-01-20 (×11): 80 mg via INTRAVENOUS
  Filled 2019-01-15 (×10): qty 8

## 2019-01-15 MED ORDER — TAMSULOSIN HCL 0.4 MG PO CAPS
0.4000 mg | ORAL_CAPSULE | Freq: Every day | ORAL | Status: DC
  Administered 2019-01-15 – 2019-01-21 (×7): 0.4 mg via ORAL
  Filled 2019-01-15 (×7): qty 1

## 2019-01-15 NOTE — Progress Notes (Signed)
Patient desat to 84% on 5L post breathing treatment. Patient placed back on high flow nasal cannula at 7L. Patient SAT at 90%. RN aware. Resting comfortably in bed. No distress at this time. Will continue to monitor.

## 2019-01-15 NOTE — Progress Notes (Signed)
Pulmonary Medicine          Date: 01/15/2019,   MRN# [id number] Darius Taylor 05-31-1946     AdmissionWeight: 99.8 kg                 CurrentWeight: 101.6 kg       SUBJECTIVE   Patient reports clinical improvement, sitting up in bed working with respiratory therapist on flutter valve and incentive spirometry. Discussed case with Dr. Margaretmary Eddy, plan to diurese more aggressively for short time and if improvement is slow or AKI occurs, will ask CM to assist with LTAC placement.   PAST MEDICAL HISTORY   Past Medical History:  Diagnosis Date   Aortic stenosis    Arrhythmia    atrial fibrillation   Asthma    Atrial fibrillation (HCC)    CAD (coronary artery disease)    Cancer (HCC)    colon, bladder and kidney   CHF (congestive heart failure) (HCC)    Colon adenocarcinoma (HCC)    COPD (chronic obstructive pulmonary disease) ([place])    H/O ventricular tachycardia    Hypertension    Nonischemic cardiomyopathy ([place])    Status post AICD and pacemaker   Renal cell carcinoma ([place])      SURGICAL HISTORY   Past Surgical History:  Procedure Laterality Date   CARDIAC DEFIBRILLATOR PLACEMENT     CARDIAC DEFIBRILLATOR PLACEMENT     COLONOSCOPY WITH PROPOFOL N/A 06/10/2018   Procedure: COLONOSCOPY WITH PROPOFOL;  Surgeon: Lin Landsman, MD;  Location: [facility] ENDOSCOPY;  Service: Gastroenterology;  Laterality: N/A;   CORONARY ANGIOPLASTY  09/25/2016   IRRIGATION AND DEBRIDEMENT HEMATOMA Left 10/24/2016   Procedure: IRRIGATION AND DEBRIDEMENT HEMATOMA;  Surgeon: Florene Glen, MD;  Location: [facility] ORS;  Service: General;  Laterality: Left;   KIDNEY SURGERY     left ne[hrectomy for RCC   PACEMAKER IMPLANT     PARTIAL COLECTOMY       FAMILY HISTORY   Family History  Problem Relation Age of Onset   Hypertension Mother    Stroke Father      SOCIAL HISTORY   Social History   Tobacco Use   Smoking status: Former Smoker   Smokeless  tobacco: Never Used  Substance Use Topics   Alcohol use: No   Drug use: No     MEDICATIONS    Home Medication:    Current Medication:  Current Facility-Administered Medications:    0.9 %  sodium chloride infusion, , Intravenous, PRN, Gouru, Aruna, MD, Stopped at 01/11/19 0047   acetaminophen (TYLENOL) tablet 650 mg, 650 mg, Oral, Q6H PRN, 650 mg at 01/15/19 0650 **OR** acetaminophen (TYLENOL) suppository 650 mg, 650 mg, Rectal, Q6H PRN, Lance Coon, MD   albuterol (PROVENTIL) (2.5 MG/3ML) 0.083% nebulizer solution 2.5 mg, 2.5 mg, Nebulization, Q4H PRN, Gouru, Aruna, MD, 2.5 mg at 01/14/19 0502   amiodarone (PACERONE) tablet 200 mg, 200 mg, Oral, Daily, Gouru, Aruna, MD, 200 mg at 01/14/19 0851   amLODipine (NORVASC) tablet 10 mg, 10 mg, Oral, Daily, Lance Coon, MD, 10 mg at 01/14/19 4696   apixaban (ELIQUIS) tablet 5 mg, 5 mg, Oral, BID, Lance Coon, MD, 5 mg at 01/14/19 2119   aspirin chewable tablet 81 mg, 81 mg, Oral, Daily, Lance Coon, MD, 81 mg at 01/14/19 0851   docusate sodium (COLACE) capsule 100 mg, 100 mg, Oral, Daily, Fritzi Mandes, MD, 100 mg at 01/14/19 0849   feeding supplement (NEPRO CARB STEADY) liquid 237 mL, 237 mL,  Oral, Q24H, Gouru, Aruna, MD, 237 mL at 35/59/74 1638   folic acid (FOLVITE) tablet 1 mg, 1 mg, Oral, Daily, Lance Coon, MD, 1 mg at 01/14/19 0849   furosemide (LASIX) injection 80 mg, 80 mg, Intravenous, BID, Gouru, Aruna, MD   guaiFENesin-dextromethorphan (ROBITUSSIN DM) 100-10 MG/5ML syrup 5 mL, 5 mL, Oral, Q4H PRN, Gouru, Aruna, MD, 5 mL at 01/02/19 0618   insulin aspart (novoLOG) injection 0-15 Units, 0-15 Units, Subcutaneous, TID WC, Wilhelmina Mcardle, MD, 2 Units at 01/14/19 1158   insulin aspart (novoLOG) injection 0-5 Units, 0-5 Units, Subcutaneous, QHS, Wilhelmina Mcardle, MD, 2 Units at 01/11/19 2133   ipratropium-albuterol (DUONEB) 0.5-2.5 (3) MG/3ML nebulizer solution 3 mL, 3 mL, Nebulization, Q6H, Gouru, Aruna, MD, 3 mL  at 01/15/19 0855   magnesium oxide (MAG-OX) tablet 400 mg, 400 mg, Oral, BID, Lance Coon, MD, 400 mg at 01/14/19 2119   MEDLINE mouth rinse, 15 mL, Mouth Rinse, BID, Wilhelmina Mcardle, MD, 15 mL at 01/14/19 2119   metoprolol succinate (TOPROL-XL) 24 hr tablet 100 mg, 100 mg, Oral, Daily, Ottie Glazier, MD, 100 mg at 01/14/19 4536   multivitamin with minerals tablet 1 tablet, 1 tablet, Oral, Daily, Lance Coon, MD, 1 tablet at 01/14/19 4680   [DISCONTINUED] ondansetron [facility]) tablet 4 mg, 4 mg, Oral, Q6H PRN, 4 mg at 01/03/19 1407 **OR** ondansetron (ZOFRAN) injection 4 mg, 4 mg, Intravenous, Q6H PRN, Lance Coon, MD, 4 mg at 01/12/19 0421   potassium chloride 20 MEQ/15ML (10%) solution 20 mEq, 20 mEq, Oral, Daily, Mody, Sital, MD, 20 mEq at 01/14/19 0858   sodium chloride flush (NS) 0.9 % injection 10 mL, 10 mL, Intravenous, Q12H, Gouru, Aruna, MD, 10 mL at 01/14/19 2120   tamsulosin (FLOMAX) capsule 0.4 mg, 0.4 mg, Oral, Daily, Gouru, Aruna, MD   thiamine (VITAMIN B-1) tablet 100 mg, 100 mg, Oral, Daily, 100 mg at 01/14/19 0851 **OR** [DISCONTINUED] thiamine (B-1) injection 100 mg, 100 mg, Intravenous, Daily, Lance Coon, MD, 100 mg at 01/04/19 0908    ALLERGIES   Patient has no known allergies.     REVIEW OF SYSTEMS    Review of Systems:  Gen:  Denies  fever, sweats, chills weigh loss  HEENT: Denies blurred vision, double vision, ear pain, eye pain, hearing loss, nose bleeds, sore throat Cardiac:  No dizziness, chest pain or heaviness, chest tightness,edema Resp:   Denies cough or sputum porduction, shortness of breath,wheezing, hemoptysis,  Gi: Denies swallowing difficulty, stomach pain, nausea or vomiting, diarrhea, constipation, bowel incontinence Gu:  Denies bladder incontinence, burning urine Ext:   Denies Joint pain, stiffness or swelling Skin: Denies  skin rash, easy bruising or bleeding or hives Endoc:  Denies polyuria, polydipsia , polyphagia or weight  change Psych:   Denies depression, insomnia or hallucinations   Other:  All other systems negative   VS: BP 129/88 (BP Location: Right Arm)    Pulse 68    Temp 98.2 F (36.8 C) (Oral)    Resp (!) 22    Ht 5\' 11"  (1.803 m)    Wt 101.6 kg    SpO2 (!) 89%    BMI 31.24 kg/m      PHYSICAL EXAM    GENERAL:NAD, no fevers, chills, no weakness no fatigue HEAD: Normocephalic, atraumatic.  EYES: Pupils equal, round, reactive to light. Extraocular muscles intact. No scleral icterus.  MOUTH: Moist mucosal membrane. Dentition intact. No abscess noted.  EAR, NOSE, THROAT: Clear without exudates. No external lesions.  NECK:  Supple. No thyromegaly. No nodules. No JVD.  PULMONARY: Mild bibasilar crepitations without rhonchorous breath sounds or wheezing. CARDIOVASCULAR: S1 and S2. Regular rate and rhythm. No murmurs, rubs, or gallops. No edema. Pedal pulses 2+ bilaterally.  GASTROINTESTINAL: Soft, nontender, nondistended. No masses. Positive bowel sounds. No hepatosplenomegaly.  MUSCULOSKELETAL: No swelling, clubbing, or edema. Range of motion full in all extremities.  NEUROLOGIC: Cranial nerves II through XII are intact. No gross focal neurological deficits. Sensation intact. Reflexes intact.  SKIN: No ulceration, lesions, rashes, or cyanosis. Skin warm and dry. Turgor intact.  PSYCHIATRIC: Mood, affect within normal limits. The patient is awake, alert and oriented x 3. Insight, judgment intact.       IMAGING    Dg Chest 1 View  Result Date: 01/10/2019 CLINICAL DATA:  Pneumonia. Hx of asthma, A-fib, CAD CHF, COPD, HTN. EXAM: CHEST  1 VIEW COMPARISON:  01/09/19 FINDINGS: LEFT-sided pacemaker overlies stable enlarged cardiac silhouette. The diffuse lobar airspace disease in the RIGHT upper lobe. Mild airspace disease in the RIGHT lower lobe. No focal consolidation. No pneumothorax. Chronic elevation LEFT hemidiaphragm. IMPRESSION: 1. No significant interval change. 2. RIGHT lung asymmetric airspace  disease representing asymmetric edema versus asymmetric pulmonary infection. Electronically Signed   By: Suzy Bouchard M.D.   On: 01/10/2019 07:50   Dg Chest 1 View  Result Date: 12/31/2018 CLINICAL DATA:  SOB. EXAM: CHEST  1 VIEW COMPARISON:  12/30/2018. FINDINGS: Marked cardiac enlargement. Dual lead pacer/AICD. Mild vascular congestion. No overt edema. Chronic elevation LEFT hemidiaphragm. No pneumothorax. Aortic atherosclerosis. IMPRESSION: Cardiomegaly. Mild vascular congestion without overt edema. Stable appearance from priors. Electronically Signed   By: Staci Righter M.D.   On: 12/31/2018 08:20   Dg Abd 1 View  Result Date: 01/05/2019 CLINICAL DATA:  Nausea and vomiting EXAM: ABDOMEN - 1 VIEW COMPARISON:  09/28/2016 FINDINGS: There is a large amount of stool in the ascending and transverse colon. The bowel gas pattern is normal. No radio-opaque calculi or other significant radiographic abnormality are seen. Mild osteoarthritis of the right hip. IMPRESSION: Large amount of stool in the ascending and transverse colon. Electronically Signed   By: Kathreen Devoid   On: 01/05/2019 19:42   Ct Chest Wo Contrast  Result Date: 01/02/2019 CLINICAL DATA:  Dyspnea.  History of colon cancer. EXAM: CT CHEST WITHOUT CONTRAST TECHNIQUE: Multidetector CT imaging of the chest was performed following the standard protocol without IV contrast. COMPARISON:  Radiographs of December 31, 2018. CT scan of November 10, 2018. FINDINGS: Cardiovascular: Atherosclerosis of thoracic aorta is noted without aneurysm formation. Mild cardiomegaly is noted. Status post transcatheter aortic valve replacement. Coronary artery calcifications are noted. Mediastinum/Nodes: No enlarged mediastinal or axillary lymph nodes. Thyroid gland, trachea, and esophagus demonstrate no significant findings. Lungs/Pleura: No pneumothorax or pleural effusion is noted. Moderate left lower lobe subsegmental atelectasis or pneumonia is noted which is  increased compared to prior exam. Mild right posterior basilar subsegmental atelectasis or inflammation is noted posteriorly in right lower lobe. New ground-glass opacity seen posteriorly in the right upper lobe concerning for pneumonia. Upper Abdomen: No acute abnormality. Musculoskeletal: No chest wall mass or suspicious bone lesions identified. IMPRESSION: New ground-glass opacity seen posteriorly in right upper lobe concerning for pneumonia; this may represent typical or atypical infection, including viral etiology. Also noted are increased bilateral lower lobe opacities concerning for worsening atelectasis or pneumonia, left worse than right. Coronary artery calcifications are noted. Aortic Atherosclerosis (ICD10-I70.0). Electronically Signed   By: Marijo Conception, M.D.  On: 01/02/2019 16:18   Dg Chest Port 1 View  Result Date: 01/15/2019 CLINICAL DATA:  73 year old male with shortness of breath. Negative for COVID-19 on 01/03/2019. EXAM: PORTABLE CHEST 1 VIEW COMPARISON:  01/10/2019 and earlier. FINDINGS: Portable AP upright view at 0342 hours. Stable cardiomegaly and mediastinal contours. Prior TAVR. Calcified aortic atherosclerosis. Stable left chest AICD. Coarse bilateral pulmonary interstitial opacity is confluent in the right upper lobe and at the left lung base. Ventilation has worsened since 01/08/2019, and the right upper lobe opacity appears mildly progressed since yesterday. No superimposed pneumothorax. Small left pleural effusion is difficult to exclude. IMPRESSION: 1. Coarse bilateral pulmonary opacity with mild progression in the right upper lobe since yesterday. 2. Stable cardiomegaly. Aortic Atherosclerosis (ICD10-I70.0). Electronically Signed   By: Genevie Ann M.D.   On: 01/15/2019 06:00   Dg Chest Port 1 View  Result Date: 01/09/2019 CLINICAL DATA:  Acute respiratory failure. EXAM: PORTABLE CHEST 1 VIEW COMPARISON:  Radiographs 01/08/2019 and 01/06/2019.  CT 01/02/2019. FINDINGS: 0937  hours. The left subclavian pacemaker leads appear stable. There is stable cardiomegaly and aortic atherosclerosis post TAVR. They were stable asymmetric airspace opacities in the right upper lobe and left lung base. No pneumothorax or significant pleural effusion. The bones appear unchanged. IMPRESSION: No significant change in asymmetric bilateral airspace opacities, presumably infectious/inflammatory. Stable cardiomegaly. Electronically Signed   By: Richardean Sale M.D.   On: 01/09/2019 10:24   Dg Chest Port 1 View  Result Date: 01/08/2019 CLINICAL DATA:  Respiratory failure. EXAM: PORTABLE CHEST 1 VIEW COMPARISON:  One-view chest x-ray 01/06/2019 FINDINGS: AND: FINDINGS: AND Heart is enlarged. Aortic valve replacement is noted. Atherosclerotic changes are present in the aorta. Pacing and defibrillator wires are stable. There is improving aeration of both lungs. Right upper lobe and left lower lobe airspace consolidation remain. Mild pulmonary vascular congestion is noted. IMPRESSION: 1. Slight improved aeration of both lungs with persistent right upper lobe and left lower lobe pneumonia. 2. Cardiomegaly with mild respiratory changes. Electronically Signed   By: San Morelle M.D.   On: 01/08/2019 06:40   Dg Chest Port 1 View  Result Date: 01/06/2019 CLINICAL DATA:  Respiratory failure. EXAM: PORTABLE CHEST 1 VIEW COMPARISON:  Chest x-ray from yesterday. FINDINGS: Unchanged left chest wall AICD. Stable cardiomegaly status post TAVR. Atherosclerotic calcification of the aortic arch. Unchanged pulmonary vascular congestion and interstitial thickening. Increased patchy opacity in the right upper lobe. Unchanged left greater than right bibasilar atelectasis. No large pleural effusion. No pneumothorax. No acute osseous abnormality. IMPRESSION: 1. Increasing patchy opacity in the right upper lobe, concerning for pneumonia. 2. Unchanged mild pulmonary interstitial edema and bibasilar atelectasis.  Electronically Signed   By: Titus Dubin M.D.   On: 01/06/2019 11:14   Dg Chest Port 1 View  Result Date: 01/05/2019 CLINICAL DATA:  Respiratory failure. EXAM: PORTABLE CHEST 1 VIEW COMPARISON:  CT of the chest on 01/02/2019 and chest x-ray on 12/31/2018 FINDINGS: Stable cardiac enlargement, appearance of pacing/ICD device and appearance transcatheter aortic valve. Lungs show subtle opacities in the right upper lobe and both lung bases potentially representing pneumonia. There also may be a component of mild pulmonary interstitial edema and bibasilar atelectasis. No significant pleural effusion. No pneumothorax. IMPRESSION: Subtle opacities in the right upper lobe and both lung bases potentially representing multifocal pneumonia. There also may be a component of mild pulmonary interstitial edema and bibasilar atelectasis. Electronically Signed   By: Aletta Edouard M.D.   On: 01/05/2019 08:37   Dg  Chest Port 1 View  Result Date: 12/30/2018 CLINICAL DATA:  Progressive shortness of breath. EXAM: PORTABLE CHEST 1 VIEW COMPARISON:  Radiograph yesterday. CT 11/10/2018 FINDINGS: Similar cardiomegaly. Unchanged mediastinal contours. Aortic atherosclerosis with aortic root replacement. Left-sided pacemaker remains in place. Vascular congestion without overt pulmonary edema. Mild elevation of left hemidiaphragm. Improved atelectasis in the left mid lung. No large pleural effusion. No pneumothorax. Unchanged osseous structures. IMPRESSION: Cardiomegaly with vascular congestion. No overt pulmonary edema. Improved left midlung atelectasis. Aortic Atherosclerosis (ICD10-I70.0). Electronically Signed   By: Keith Rake M.D.   On: 12/30/2018 02:01   Dg Chest Portable 1 View  Result Date: 12/29/2018 CLINICAL DATA:  Weakness with apparent hypotension EXAM: PORTABLE CHEST 1 VIEW COMPARISON:  Chest radiograph November 05, 2018 and chest CT November 10, 2018 FINDINGS: There is atelectatic change in the left mid lung.  There is no appreciable edema or consolidation. Heart is mildly enlarged with pulmonary vascularity normal. Pacemaker leads are attached the right atrium and right ventricle. There is aortic atherosclerosis. No adenopathy. No bone lesions. There is calcification in each carotid artery. IMPRESSION: Left midlung atelectasis. No edema or consolidation. Mild cardiomegaly. Pacemaker leads attached to right atrium and right ventricle. Aortic Atherosclerosis (ICD10-I70.0). Bilateral carotid artery calcification noted. Electronically Signed   By: Lowella Grip III M.D.   On: 12/29/2018 21:14   [place] Ekg Site Rite  Result Date: 01/14/2019 If Site Rite image not attached, placement could not be confirmed due to current cardiac rhythm.     ASSESSMENT/PLAN    Acute on chronic hypoxemic respiratory failure -acute decompensated diastolic CHF exacerbation -       -Continue diuresis, increasing to 80 twice daily Lasix-total net -11.758 L      -Continue strict I's and O's with 1200 cc fluid restriction      -Discussed possible LTAC transfer if diuresis is slow due to increased length of stay at this point  -Bibasilar atelectasis-have reviewed with patient appropriate incentive spirometry and Acapella device usage, encourage use each hour multiple times-discussed with Bambi RT appreciate input.  -Please perform exertional walk to delineate amount of oxygen needed to keep SPO2 over 88%-in preparation to discharge home onincreasedoxygen  -Febrile episode-possible pneumonia status post antibiotic treatment completed therapy   - will need outpatient workup for pulmonary hypertension via RHC - RVSP was significantly elevated on TTE will discuss with cardiology - appreciate input  -appreciate physical therapy input      COPD -continue nebulizer therapy - IS/ Chest PT - no need for steroidsat this time    Thank you for allowing me to  participate in the care of this patient.   Patient/Family are satisfied with care plan and all questions have been answered.  This document was prepared using Dragon voice recognition software and may include unintentional dictation errors.     Ottie Glazier, M.D.  Division of [place]

## 2019-01-15 NOTE — Care Management Important Message (Signed)
Important Message  Patient Details  Name: Darius Taylor MRN: [id number] Date of Birth: 20-Nov-1945   Medicare Important Message Given:  Yes    Dannette Barbara 01/15/2019, 1:15 PM

## 2019-01-15 NOTE — Progress Notes (Signed)
Ch f/u w/ pt. Pt shared that he lives alone since his wife passed two years ago. Pt shared that he hopes to be able to breath and have the fluid removed. Pt also shared that he sometimes has trouble passing urine. Ch provided a compassionate presence and prayed for pt.    01/15/19 1100  Clinical Encounter Type  Visited With Patient  Visit Type Follow-up  Spiritual Encounters  Spiritual Needs Prayer  Stress Factors  Patient Stress Factors Health changes;Loss of control;Loss;Major life changes

## 2019-01-15 NOTE — Progress Notes (Signed)
[facility] [place] NAME: Darius Taylor    MR#:  [id number]  DATE OF BIRTH:  [birth year]  SUBJECTIVE:   Patient is still on high flow 6 l of oxygen.  Reporting he is making good urine after Lasix dose increased  -1255 in the past 24 hours and 11.5  L so far  reporting that he is straining while urinating   Reports using 2 L of oxygen as needed at home prior to this admission  REVIEW OF SYSTEMS:    Review of Systems  Constitutional: Negative for fever, chills weight loss HENT: Negative for ear pain, nosebleeds, congestion, facial swelling, rhinorrhea, neck pain, neck stiffness and ear discharge.   Respiratory: no cough, ++IMPROVED shortness of breath, no wheezing  Cardiovascular: Negative for chest pain, palpitations and + MILD leg swelling.  Gastrointestinal: Negative for heartburn, abdominal pain, vomiting, diarrhea or consitpation Genitourinary: Negative for dysuria, urgency, frequency, hematuria Musculoskeletal: Negative for back pain or joint pain Neurological: Negative for dizziness, seizures, syncope, focal weakness,  numbness and headaches.  Hematological: Does not bruise/bleed easily.  Psychiatric/Behavioral: Negative for hallucinations, confusion, dysphoric mood    Tolerating Diet: yes      DRUG ALLERGIES:  No Known Allergies  VITALS:  Blood pressure 129/88, pulse 68, temperature 98.2 F (36.8 C), temperature source Oral, resp. rate (!) 22, height 5\' 11"  (1.803 m), weight 101.6 kg, SpO2 (!) 89 %.  PHYSICAL EXAMINATION:  Constitutional: Appears well-developed and well-nourished. No distress. HENT: Normocephalic. . Eyes: Conjunctivae and EOM are normal. PERRLA, no scleral icterus.  Neck: Normal ROM. Neck supple. No JVD. No tracheal deviation. CVS: RRR, S1/S2 +, 3/6  murmurs, no gallops, no carotid bruit.  Pulmonary: Normal respiratory effort with some mild rhonchi right upper lobe.   Abdominal: Soft. BS +,  no  distension, tenderness, rebound or guarding.  Musculoskeletal: Normal range of motion. 1+ LE edema and no tenderness.  Neuro: Alert. CN 2-12 grossly intact. No focal deficits. Skin: Skin is warm and dry. No rash noted. Psychiatric: Normal mood and affect.      LABORATORY PANEL:   CBC Recent Labs  Lab 01/15/19 0315  WBC 14.7*  HGB 7.1*  HCT 23.7*  PLT 325   ------------------------------------------------------------------------------------------------------------------  Chemistries  Recent Labs  Lab 01/14/19 0352  NA 142  K 4.9  CL 107  CO2 28  GLUCOSE 150*  BUN 60*  CREATININE 1.59*  CALCIUM 8.9   ------------------------------------------------------------------------------------------------------------------  Cardiac Enzymes No results for input(s): TROPONINI in the last 168 hours.------------------------------------------------------------------------------------------------------------------  RADIOLOGY:  Dg Chest Port 1 View  Result Date: 01/15/2019 CLINICAL DATA:  73 year old male with shortness of breath. Negative for COVID-19 on 01/03/2019. EXAM: PORTABLE CHEST 1 VIEW COMPARISON:  01/10/2019 and earlier. FINDINGS: Portable AP upright view at 0342 hours. Stable cardiomegaly and mediastinal contours. Prior TAVR. Calcified aortic atherosclerosis. Stable left chest AICD. Coarse bilateral pulmonary interstitial opacity is confluent in the right upper lobe and at the left lung base. Ventilation has worsened since 01/08/2019, and the right upper lobe opacity appears mildly progressed since yesterday. No superimposed pneumothorax. Small left pleural effusion is difficult to exclude. IMPRESSION: 1. Coarse bilateral pulmonary opacity with mild progression in the right upper lobe since yesterday. 2. Stable cardiomegaly. Aortic Atherosclerosis (ICD10-I70.0). Electronically Signed   By: Genevie Ann M.D.   On: 01/15/2019 06:00   [place] Ekg Site Rite  Result Date: 01/14/2019 If Site  Rite image not attached, placement could not be confirmed due to current  cardiac rhythm.    ASSESSMENT AND PLAN:   73 year old male with a history of PAF, EtOH abuse and COPD who presented to the emergency room due to generalized weakness and found to have elevated creatinine.   1.  Acute on chronic kidney disease stage III due to EtOH abuse and dehydration: Creatinine has improved  . 2.  Acute on chronic hypoxic respiratory failure: Patient was transferred to ICU and placed on BiPAP and then transferred out of ICU however back to stepdown unit 4/13 due to increasing shortness of breath and hypoxia. Acute hypoxic respiratory failure with worsening O2 requirements due to right upper lobe/left lower lobe hospital-acquired pneumonia and acute on chronic diastolic heart failure.   Increase Lasix dose to 80 mg twice daily as patient still has significant lower extremity edema and bilateral rales and rhonchi.  Fluid restriction to 1200 cc/day Still on high flow 6  L will try to wean off.  Patient uses 2 L of oxygen at home as needed  Appreciate pulmonology follow-up Patient received cefepime and vancomycin for 7 days total.  Completed antibiotic course Patient has tested negative for COVID-19.  Respiratory viral panel was also negative. MRSA PCR was negative.  IV albumin will be continued -Flutter valve device    3.  Acute on chronic diastolic heart failure:  Continue IV Lasix 80 milligrams twice daily Monitor intake and output  4.  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  Increasing Lasix dose to 80 mg iv twice daily.  If no improvement will consider Lasix drip .  Continue close monitoring and avoid nephrotoxins -We will consider nephrology consult if needed otherwise outpatient nephrology follow-up  5.  PAF, history of aortic stenosis status post TAVR: Patient currently in sinus  rhythm Continue Eliquis.  Resume home medication amiodarone  6.  COPD without signs of exacerbation: Continue nebs  7.  Essential hypertension: Continue Norvasc  8.  Anemia of chronic disease with  hemoglobin at 7.1 today.  Will check in a.m.  MCV 99.6  check anemia labs  #Dysuria with history of bladder cancer- Status post cystoscopy care at [facility] urology Check PSA Flomax Will consult urology  #Outpatient follow-up with podiatry for nail care as per my discussion with Dr. Vickki Muff     Management plans discussed with the patient and he is in agreement.  CODE STATUS: full  TOTAL TIME TAKING CARE OF THIS PATIENT: 35 minutes.     POSSIBLE D/c ? with HHC if we can get O2 down.  If no clinical improvement LTAC is an option will discuss with the patient  Nicholes Mango M.D on 01/15/2019 at 11:17 AM  Between 7am to 6pm - Pager - [phone] After 6pm go to [email or web address] - password [email or web address] [facility]  Office  [phone]  CC: Primary care physician; [employer]  Note: This dictation was prepared with Diplomatic Services operational officer dictation along with smaller Company secretary. Any transcriptional errors that result from this process are unintentional.

## 2019-01-15 NOTE — Progress Notes (Signed)
Ch visited pt during rounds. Pt shared that he was just recently hospitalized and had to come back because he had fluid on his lungs. Pt is also trying to recover from pneumonia and shared that he is on 2 L of O2 as needed at home. Ch practiced active listening yet allowed time for pt to talk w/ family on the phone. F/u at a later time.    01/15/19 1000  Clinical Encounter Type  Visited With Patient  Visit Type Psychological support;Spiritual support;Social support  Spiritual Encounters  Spiritual Needs Emotional;Grief support  Stress Factors  Patient Stress Factors Health changes;Exhausted;Major life changes  Family Stress Factors None identified

## 2019-01-15 NOTE — Plan of Care (Signed)
  Problem: Health Behavior/Discharge Planning: Goal: Ability to manage health-related needs will improve Outcome: Progressing   Problem: Clinical Measurements: Goal: Will remain free from infection Outcome: Progressing   Problem: Activity: Goal: Risk for activity intolerance will decrease Outcome: Progressing   Problem: Coping: Goal: Level of anxiety will decrease Outcome: Progressing   Problem: Pain Managment: Goal: General experience of comfort will improve Outcome: Progressing   Problem: Safety: Goal: Ability to remain free from injury will improve Outcome: Progressing   

## 2019-01-15 NOTE — Plan of Care (Signed)
  Problem: Health Behavior/Discharge Planning: Goal: Ability to manage health-related needs will improve Outcome: Progressing   Problem: Clinical Measurements: Goal: Ability to maintain clinical measurements within normal limits will improve Outcome: Progressing Goal: Will remain free from infection Outcome: Progressing Goal: Diagnostic test results will improve Outcome: Progressing Goal: Respiratory complications will improve Outcome: Progressing Goal: Cardiovascular complication will be avoided Outcome: Progressing   Problem: Activity: Goal: Risk for activity intolerance will decrease Outcome: Progressing   Problem: Nutrition: Goal: Adequate nutrition will be maintained Outcome: Progressing   Problem: Coping: Goal: Level of anxiety will decrease Outcome: Progressing   Problem: Elimination: Goal: Will not experience complications related to bowel motility Outcome: Progressing Goal: Will not experience complications related to urinary retention Outcome: Progressing   Problem: Pain Managment: Goal: General experience of comfort will improve Outcome: Progressing   Problem: Safety: Goal: Ability to remain free from injury will improve Outcome: Progressing   Problem: Skin Integrity: Goal: Risk for impaired skin integrity will decrease Outcome: Progressing   Problem: Activity: Goal: Ability to tolerate increased activity will improve Outcome: Progressing   Problem: Clinical Measurements: Goal: Ability to maintain a body temperature in the normal range will improve Outcome: Progressing   Problem: Respiratory: Goal: Ability to maintain adequate ventilation will improve Outcome: Progressing Goal: Ability to maintain a clear airway will improve Outcome: Progressing   

## 2019-01-16 LAB — CBC
HCT: 24.1 % — ABNORMAL LOW (ref 39.0–52.0)
Hemoglobin: 7.1 g/dL — ABNORMAL LOW (ref 13.0–17.0)
MCH: 29.7 pg (ref 26.0–34.0)
MCHC: 29.5 g/dL — ABNORMAL LOW (ref 30.0–36.0)
MCV: 100.8 fL — ABNORMAL HIGH (ref 80.0–100.0)
Platelets: 290 10*3/uL (ref 150–400)
RBC: 2.39 MIL/uL — ABNORMAL LOW (ref 4.22–5.81)
RDW: 17.8 % — ABNORMAL HIGH (ref 11.5–15.5)
WBC: 12.2 10*3/uL — ABNORMAL HIGH (ref 4.0–10.5)
nRBC: 0.3 % — ABNORMAL HIGH (ref 0.0–0.2)

## 2019-01-16 LAB — GLUCOSE, CAPILLARY
Glucose-Capillary: 92 mg/dL (ref 70–99)
Glucose-Capillary: 152 mg/dL — ABNORMAL HIGH (ref 70–99)
Glucose-Capillary: 166 mg/dL — ABNORMAL HIGH (ref 70–99)
Glucose-Capillary: 129 mg/dL — ABNORMAL HIGH (ref 70–99)

## 2019-01-16 LAB — OCCULT BLOOD X 1 CARD TO LAB, STOOL: Fecal Occult Bld: POSITIVE — AB

## 2019-01-16 LAB — PREPARE RBC (CROSSMATCH)

## 2019-01-16 LAB — ABO/RH: ABO/RH(D): O POS

## 2019-01-16 MED ORDER — SODIUM CHLORIDE 0.9% IV SOLUTION: Freq: Once | INTRAVENOUS | Status: DC

## 2019-01-16 NOTE — TOC Progression Note (Addendum)
Transition of Care [facility] - Progression Note    Patient Details  Name: Darius Taylor MRN: [id number] Date of Birth: 06/24/1946  [facility]) CM/SW Contact  Ross Ludwig, [place] Phone Number: 01/16/2019, 4:42 PM  Clinical Narrative:    CSW contacted LTACH at [facility], and they reviewed patient, and they said he does not meet criteria for LTACH anymore due to him being on 5L, using a bipap at night, and not being on IV antibiotics anymore.  CSW to continue to follow patient's progress throughout discharge planning.  Patient is agreeable to going to SNF if he needs too, patient stated he would prefer to go home with home health.   Expected Discharge Plan: [place] Barriers to Discharge: Continued Medical Work up  Expected Discharge Plan and Services Expected Discharge Plan: [place]   Discharge Planning Services: CM Consult Post Acute Care Choice: [place] arrangements for the past 2 months: Single Family Home                           HH Arranged: RN, PT, Nurse's Aide, Social Work [employer] Agency: [employer] [facility]now [facility] Interventions    Readmission Risk Interventions Readmission Risk Prevention Plan 01/05/2019  Transportation Screening Complete  Medication Review Press photographer) Complete  PCP or Specialist appointment within 3-5 days of discharge Complete  HRI or [place] Complete  SW Recovery Care/Counseling Consult Patient refused  Palliative Care Screening Not [place] Not Applicable  Some recent data might be hidden

## 2019-01-16 NOTE — Plan of Care (Signed)
  Problem: Clinical Measurements: Goal: Respiratory complications will improve Outcome: Not Progressing   Problem: Elimination: Goal: Will not experience complications related to urinary retention Outcome: Progressing   Problem: Pain Managment: Goal: General experience of comfort will improve Outcome: Progressing   Problem: Activity: Goal: Ability to tolerate increased activity will improve Outcome: Progressing   Problem: Respiratory: Goal: Ability to maintain adequate ventilation will improve Outcome: Not Progressing

## 2019-01-16 NOTE — Progress Notes (Signed)
Pulmonary Medicine          Date: 01/16/2019,   MRN# [id number] NAPOLEON MONACELLI 11-02-1945     AdmissionWeight: 99.8 kg                 CurrentWeight: 101.4 kg        SUBJECTIVE    Patient eating breakfast in NAD.  LE edema appears improved.  Remains on 5L/min O2   PAST MEDICAL HISTORY   Past Medical History:  Diagnosis Date   Aortic stenosis    Arrhythmia    atrial fibrillation   Asthma    Atrial fibrillation (HCC)    CAD (coronary artery disease)    Cancer (HCC)    colon, bladder and kidney   CHF (congestive heart failure) (HCC)    Colon adenocarcinoma (HCC)    COPD (chronic obstructive pulmonary disease) ([place])    H/O ventricular tachycardia    Hypertension    Nonischemic cardiomyopathy ([place])    Status post AICD and pacemaker   Renal cell carcinoma ([place])      SURGICAL HISTORY   Past Surgical History:  Procedure Laterality Date   CARDIAC DEFIBRILLATOR PLACEMENT     CARDIAC DEFIBRILLATOR PLACEMENT     COLONOSCOPY WITH PROPOFOL N/A 06/10/2018   Procedure: COLONOSCOPY WITH PROPOFOL;  Surgeon: Lin Landsman, MD;  Location: [facility];  Service: Gastroenterology;  Laterality: N/A;   CORONARY ANGIOPLASTY  09/25/2016   IRRIGATION AND DEBRIDEMENT HEMATOMA Left 10/24/2016   Procedure: IRRIGATION AND DEBRIDEMENT HEMATOMA;  Surgeon: Florene Glen, MD;  Location: [facility] OR[facility];  Service: General;  Laterality: Left;   KIDNEY SURGERY     left ne[hrectomy for RCC   PACEMAKER IMPLANT     PARTIAL COLECTOMY       FAMILY HISTORY   Family History  Problem Relation Age of Onset   Hypertension Mother    Stroke Father      SOCIAL HISTORY   Social History   Tobacco Use   Smoking status: Former Smoker   Smokeless tobacco: Never Used  Substance Use Topics   Alcohol use: No   Drug use: No     MEDICATIONS    Home Medication:    Current Medication:  Current Facility-Administered Medications:    0.9 %  sodium  chloride infusion (Manually program via Guardrails IV Fluids), , Intravenous, Once, Gouru, Aruna, MD   0.9 %  sodium chloride infusion, , Intravenous, PRN, Gouru, Aruna, MD, Stopped at 01/11/19 0047   acetaminophen (TYLENOL) tablet 650 mg, 650 mg, Oral, Q6H PRN, 650 mg at 01/16/19 0538 **OR** acetaminophen (TYLENOL) suppository 650 mg, 650 mg, Rectal, Q6H PRN, Lance Coon, MD   albuterol (PROVENTIL) (2.5 MG/3ML) 0.083% nebulizer solution 2.5 mg, 2.5 mg, Nebulization, Q4H PRN, Gouru, Aruna, MD, 2.5 mg at 01/14/19 0502   amiodarone (PACERONE) tablet 200 mg, 200 mg, Oral, Daily, Gouru, Aruna, MD, 200 mg at 01/16/19 0903   amLODipine (NORVASC) tablet 10 mg, 10 mg, Oral, Daily, Lance Coon, MD, 10 mg at 01/16/19 0855   apixaban (ELIQUIS) tablet 5 mg, 5 mg, Oral, BID, Lance Coon, MD, 5 mg at 01/16/19 2951   aspirin chewable tablet 81 mg, 81 mg, Oral, Daily, Lance Coon, MD, 81 mg at 01/16/19 0856   docusate sodium (COLACE) capsule 100 mg, 100 mg, Oral, Daily, Fritzi Mandes, MD, 100 mg at 01/16/19 0856   feeding supplement (NEPRO CARB STEADY) liquid 237 mL, 237 mL, Oral, Q24H, Gouru, Aruna, MD, 237 mL at 01/14/19 1500  folic acid (FOLVITE) tablet 1 mg, 1 mg, Oral, Daily, Lance Coon, MD, 1 mg at 01/16/19 0858   furosemide (LASIX) injection 80 mg, 80 mg, Intravenous, BID, Gouru, Aruna, MD, 80 mg at 01/15/19 1814   guaiFENesin-dextromethorphan (ROBITUSSIN DM) 100-10 MG/5ML syrup 5 mL, 5 mL, Oral, Q4H PRN, Gouru, Aruna, MD, 5 mL at 01/02/19 0618   insulin aspart (novoLOG) injection 0-15 Units, 0-15 Units, Subcutaneous, TID WC, Wilhelmina Mcardle, MD, 2 Units at 01/15/19 1814   insulin aspart (novoLOG) injection 0-5 Units, 0-5 Units, Subcutaneous, QHS, Wilhelmina Mcardle, MD, 2 Units at 01/11/19 2133   ipratropium-albuterol (DUONEB) 0.5-2.5 (3) MG/3ML nebulizer solution 3 mL, 3 mL, Nebulization, Q6H, Gouru, Aruna, MD, 3 mL at 01/16/19 0803   magnesium oxide (MAG-OX) tablet 400 mg, 400 mg,  Oral, BID, Lance Coon, MD, 400 mg at 01/16/19 3976   MEDLINE mouth rinse, 15 mL, Mouth Rinse, BID, Wilhelmina Mcardle, MD, 15 mL at 01/15/19 1107   metoprolol succinate (TOPROL-XL) 24 hr tablet 100 mg, 100 mg, Oral, Daily, Lanney Gins, Ruvim Risko, MD, 100 mg at 01/16/19 7341   multivitamin with minerals tablet 1 tablet, 1 tablet, Oral, Daily, Lance Coon, MD, 1 tablet at 01/16/19 0855   [DISCONTINUED] ondansetron (ZOFRAN) tablet 4 mg, 4 mg, Oral, Q6H PRN, 4 mg at 01/03/19 1407 **OR** ondansetron (ZOFRAN) injection 4 mg, 4 mg, Intravenous, Q6H PRN, Lance Coon, MD, 4 mg at 01/12/19 0421   potassium chloride 20 MEQ/15ML (10%) solution 20 mEq, 20 mEq, Oral, Daily, Mody, Sital, MD, 20 mEq at 01/16/19 0904   sodium chloride flush (NS) 0.9 % injection 10 mL, 10 mL, Intravenous, Q12H, Gouru, Aruna, MD, 10 mL at 01/15/19 2228   tamsulosin (FLOMAX) capsule 0.4 mg, 0.4 mg, Oral, Daily, Gouru, Aruna, MD, 0.4 mg at 01/16/19 9379   thiamine (VITAMIN B-1) tablet 100 mg, 100 mg, Oral, Daily, 100 mg at 01/16/19 0855 **OR** [DISCONTINUED] thiamine (B-1) injection 100 mg, 100 mg, Intravenous, Daily, Lance Coon, MD, 100 mg at 01/04/19 0908    ALLERGIES   Patient has no known allergies.     REVIEW OF SYSTEMS    Review of Systems:  Gen:  Denies  fever, sweats, chills weigh loss  HEENT: Denies blurred vision, double vision, ear pain, eye pain, hearing loss, nose bleeds, sore throat Cardiac:  No dizziness, chest pain or heaviness, chest tightness,edema Resp:   Denies cough or sputum porduction, shortness of breath,wheezing, hemoptysis,  Gi: Denies swallowing difficulty, stomach pain, nausea or vomiting, diarrhea, constipation, bowel incontinence Gu:  Denies bladder incontinence, burning urine Ext:   Denies Joint pain, stiffness or swelling Skin: Denies  skin rash, easy bruising or bleeding or hives Endoc:  Denies polyuria, polydipsia , polyphagia or weight change Psych:   Denies depression,  insomnia or hallucinations   Other:  All other systems negative   VS: BP 114/66 (BP Location: Right Arm)    Pulse 64    Temp 98.4 F (36.9 C) (Oral)    Resp 17    Ht 5\' 11"  (1.803 m)    Wt 101.4 kg    SpO2 96%    BMI 31.18 kg/m      PHYSICAL EXAM    GENERAL:NAD, no fevers, chills, no weakness no fatigue HEAD: Normocephalic, atraumatic.  EYES: Pupils equal, round, reactive to light. Extraocular muscles intact. No scleral icterus.  MOUTH: Moist mucosal membrane. Dentition intact. No abscess noted.  EAR, NOSE, THROAT: Clear without exudates. No external lesions.  NECK: Supple. No thyromegaly. No  nodules. No JVD.  PULMONARY: Decreased breath sounds bilaterally, mild bibasilar crackles CARDIOVASCULAR: S1 and S2. Regular rate and rhythm. No murmurs, rubs, or gallops. No edema. Pedal pulses 2+ bilaterally.  GASTROINTESTINAL: Soft, nontender, nondistended. No masses. Positive bowel sounds. No hepatosplenomegaly.  MUSCULOSKELETAL: No swelling, clubbing, or edema. Range of motion full in all extremities.  NEUROLOGIC: Cranial nerves II through XII are intact. No gross focal neurological deficits. Sensation intact. Reflexes intact.  SKIN: No ulceration, lesions, rashes, or cyanosis. Skin warm and dry. Turgor intact.  PSYCHIATRIC: Mood, affect within normal limits. The patient is awake, alert and oriented x 3. Insight, judgment intact.       IMAGING    Dg Chest 1 View  Result Date: 01/10/2019 CLINICAL DATA:  Pneumonia. Hx of asthma, A-fib, CAD CHF, COPD, HTN. EXAM: CHEST  1 VIEW COMPARISON:  01/09/19 FINDINGS: LEFT-sided pacemaker overlies stable enlarged cardiac silhouette. The diffuse lobar airspace disease in the RIGHT upper lobe. Mild airspace disease in the RIGHT lower lobe. No focal consolidation. No pneumothorax. Chronic elevation LEFT hemidiaphragm. IMPRESSION: 1. No significant interval change. 2. RIGHT lung asymmetric airspace disease representing asymmetric edema versus asymmetric  pulmonary infection. Electronically Signed   By: Suzy Bouchard M.D.   On: 01/10/2019 07:50   Dg Chest 1 View  Result Date: 12/31/2018 CLINICAL DATA:  SOB. EXAM: CHEST  1 VIEW COMPARISON:  12/30/2018. FINDINGS: Marked cardiac enlargement. Dual lead pacer/AICD. Mild vascular congestion. No overt edema. Chronic elevation LEFT hemidiaphragm. No pneumothorax. Aortic atherosclerosis. IMPRESSION: Cardiomegaly. Mild vascular congestion without overt edema. Stable appearance from priors. Electronically Signed   By: Staci Righter M.D.   On: 12/31/2018 08:20   Dg Abd 1 View  Result Date: 01/05/2019 CLINICAL DATA:  Nausea and vomiting EXAM: ABDOMEN - 1 VIEW COMPARISON:  09/28/2016 FINDINGS: There is a large amount of stool in the ascending and transverse colon. The bowel gas pattern is normal. No radio-opaque calculi or other significant radiographic abnormality are seen. Mild osteoarthritis of the right hip. IMPRESSION: Large amount of stool in the ascending and transverse colon. Electronically Signed   By: Kathreen Devoid   On: 01/05/2019 19:42   Ct Chest Wo Contrast  Result Date: 01/02/2019 CLINICAL DATA:  Dyspnea.  History of colon cancer. EXAM: CT CHEST WITHOUT CONTRAST TECHNIQUE: Multidetector CT imaging of the chest was performed following the standard protocol without IV contrast. COMPARISON:  Radiographs of December 31, 2018. CT scan of November 10, 2018. FINDINGS: Cardiovascular: Atherosclerosis of thoracic aorta is noted without aneurysm formation. Mild cardiomegaly is noted. Status post transcatheter aortic valve replacement. Coronary artery calcifications are noted. Mediastinum/Nodes: No enlarged mediastinal or axillary lymph nodes. Thyroid gland, trachea, and esophagus demonstrate no significant findings. Lungs/Pleura: No pneumothorax or pleural effusion is noted. Moderate left lower lobe subsegmental atelectasis or pneumonia is noted which is increased compared to prior exam. Mild right posterior basilar  subsegmental atelectasis or inflammation is noted posteriorly in right lower lobe. New ground-glass opacity seen posteriorly in the right upper lobe concerning for pneumonia. Upper Abdomen: No acute abnormality. Musculoskeletal: No chest wall mass or suspicious bone lesions identified. IMPRESSION: New ground-glass opacity seen posteriorly in right upper lobe concerning for pneumonia; this may represent typical or atypical infection, including viral etiology. Also noted are increased bilateral lower lobe opacities concerning for worsening atelectasis or pneumonia, left worse than right. Coronary artery calcifications are noted. Aortic Atherosclerosis (ICD10-I70.0). Electronically Signed   By: Marijo Conception, M.D.   On: 01/02/2019 16:18   Dg  Chest Port 1 View  Result Date: 01/15/2019 CLINICAL DATA:  73 year old male with shortness of breath. Negative for COVID-19 on 01/03/2019. EXAM: PORTABLE CHEST 1 VIEW COMPARISON:  01/10/2019 and earlier. FINDINGS: Portable AP upright view at 0342 hours. Stable cardiomegaly and mediastinal contours. Prior TAVR. Calcified aortic atherosclerosis. Stable left chest AICD. Coarse bilateral pulmonary interstitial opacity is confluent in the right upper lobe and at the left lung base. Ventilation has worsened since 01/08/2019, and the right upper lobe opacity appears mildly progressed since yesterday. No superimposed pneumothorax. Small left pleural effusion is difficult to exclude. IMPRESSION: 1. Coarse bilateral pulmonary opacity with mild progression in the right upper lobe since yesterday. 2. Stable cardiomegaly. Aortic Atherosclerosis (ICD10-I70.0). Electronically Signed   By: Genevie Ann M.D.   On: 01/15/2019 06:00   Dg Chest Port 1 View  Result Date: 01/09/2019 CLINICAL DATA:  Acute respiratory failure. EXAM: PORTABLE CHEST 1 VIEW COMPARISON:  Radiographs 01/08/2019 and 01/06/2019.  CT 01/02/2019. FINDINGS: 0937 hours. The left subclavian pacemaker leads appear stable. There  is stable cardiomegaly and aortic atherosclerosis post TAVR. They were stable asymmetric airspace opacities in the right upper lobe and left lung base. No pneumothorax or significant pleural effusion. The bones appear unchanged. IMPRESSION: No significant change in asymmetric bilateral airspace opacities, presumably infectious/inflammatory. Stable cardiomegaly. Electronically Signed   By: Richardean Sale M.D.   On: 01/09/2019 10:24   Dg Chest Port 1 View  Result Date: 01/08/2019 CLINICAL DATA:  Respiratory failure. EXAM: PORTABLE CHEST 1 VIEW COMPARISON:  One-view chest x-ray 01/06/2019 FINDINGS: AND: FINDINGS: AND Heart is enlarged. Aortic valve replacement is noted. Atherosclerotic changes are present in the aorta. Pacing and defibrillator wires are stable. There is improving aeration of both lungs. Right upper lobe and left lower lobe airspace consolidation remain. Mild pulmonary vascular congestion is noted. IMPRESSION: 1. Slight improved aeration of both lungs with persistent right upper lobe and left lower lobe pneumonia. 2. Cardiomegaly with mild respiratory changes. Electronically Signed   By: San Morelle M.D.   On: 01/08/2019 06:40   Dg Chest Port 1 View  Result Date: 01/06/2019 CLINICAL DATA:  Respiratory failure. EXAM: PORTABLE CHEST 1 VIEW COMPARISON:  Chest x-ray from yesterday. FINDINGS: Unchanged left chest wall AICD. Stable cardiomegaly status post TAVR. Atherosclerotic calcification of the aortic arch. Unchanged pulmonary vascular congestion and interstitial thickening. Increased patchy opacity in the right upper lobe. Unchanged left greater than right bibasilar atelectasis. No large pleural effusion. No pneumothorax. No acute osseous abnormality. IMPRESSION: 1. Increasing patchy opacity in the right upper lobe, concerning for pneumonia. 2. Unchanged mild pulmonary interstitial edema and bibasilar atelectasis. Electronically Signed   By: Titus Dubin M.D.   On: 01/06/2019 11:14     Dg Chest Port 1 View  Result Date: 01/05/2019 CLINICAL DATA:  Respiratory failure. EXAM: PORTABLE CHEST 1 VIEW COMPARISON:  CT of the chest on 01/02/2019 and chest x-ray on 12/31/2018 FINDINGS: Stable cardiac enlargement, appearance of pacing/ICD device and appearance transcatheter aortic valve. Lungs show subtle opacities in the right upper lobe and both lung bases potentially representing pneumonia. There also may be a component of mild pulmonary interstitial edema and bibasilar atelectasis. No significant pleural effusion. No pneumothorax. IMPRESSION: Subtle opacities in the right upper lobe and both lung bases potentially representing multifocal pneumonia. There also may be a component of mild pulmonary interstitial edema and bibasilar atelectasis. Electronically Signed   By: Aletta Edouard M.D.   On: 01/05/2019 08:37   Dg Chest [facility]  Result Date: 12/30/2018 CLINICAL DATA:  Progressive shortness of breath. EXAM: PORTABLE CHEST 1 VIEW COMPARISON:  Radiograph yesterday. CT 11/10/2018 FINDINGS: Similar cardiomegaly. Unchanged mediastinal contours. Aortic atherosclerosis with aortic root replacement. Left-sided pacemaker remains in place. Vascular congestion without overt pulmonary edema. Mild elevation of left hemidiaphragm. Improved atelectasis in the left mid lung. No large pleural effusion. No pneumothorax. Unchanged osseous structures. IMPRESSION: Cardiomegaly with vascular congestion. No overt pulmonary edema. Improved left midlung atelectasis. Aortic Atherosclerosis (ICD10-I70.0). Electronically Signed   By: Keith Rake M.D.   On: 12/30/2018 02:01   Dg Chest Portable 1 View  Result Date: 12/29/2018 CLINICAL DATA:  Weakness with apparent hypotension EXAM: PORTABLE CHEST 1 VIEW COMPARISON:  Chest radiograph November 05, 2018 and chest CT November 10, 2018 FINDINGS: There is atelectatic change in the left mid lung. There is no appreciable edema or consolidation. Heart is mildly enlarged  with pulmonary vascularity normal. Pacemaker leads are attached the right atrium and right ventricle. There is aortic atherosclerosis. No adenopathy. No bone lesions. There is calcification in each carotid artery. IMPRESSION: Left midlung atelectasis. No edema or consolidation. Mild cardiomegaly. Pacemaker leads attached to right atrium and right ventricle. Aortic Atherosclerosis (ICD10-I70.0). Bilateral carotid artery calcification noted. Electronically Signed   By: Lowella Grip III M.D.   On: 12/29/2018 21:14   [place] Ekg Site Rite  Result Date: 01/14/2019 If Site Rite image not attached, placement could not be confirmed due to current cardiac rhythm.     ASSESSMENT/PLAN     Acute on chronic hypoxemic respiratory failure -acute decompensated diastolic CHF exacerbation -       -Continue diuresis, increasing to 80 twice daily Lasix-negative 1 L overnight.      -Continue strict I's and O's with 1200 cc fluid restriction      -Discussed possible LTAC transfer if diuresis is slow due to increased length of stay at this point  -Bibasilar atelectasis-have reviewed with patient appropriate incentive spirometry and Acapella device usage, encourage use each hour multiple times-discussed with Bambi RT appreciate input.  -Please perform exertional walk to delineate amount of oxygen needed to keep SPO2 over 88%-in preparation to discharge home onincreasedoxygen    - will need outpatient workup for pulmonary hypertension via [facility] - RVSP was significantly elevated on TTE will discuss with cardiology - appreciate input  -appreciate physical therapy input     COPD -continue nebulizer therapy - IS/ Chest PT - no need for steroidsat this time   Thank you for allowing me to participate in the care of this patient.   Patient/Family are satisfied with care plan and all questions have been answered.  This document was prepared using  Dragon voice recognition software and may include unintentional dictation errors.     Ottie Glazier, M.D.  Division of [facility]

## 2019-01-16 NOTE — Progress Notes (Signed)
PT Cancellation Note  Patient Details Name: Darius Taylor MRN: [id number] DOB: 1946/07/01   Cancelled Treatment:    Reason Eval/Treat Not Completed: Medical issues which prohibited therapy Patient receiving blood, will attempt tomorrow  Shelton Silvas PT, DPT Shelton Silvas 01/16/2019, 3:14 PM

## 2019-01-16 NOTE — Plan of Care (Signed)
  Problem: Health Behavior/Discharge Planning: Goal: Ability to manage health-related needs will improve Outcome: Progressing   Problem: Clinical Measurements: Goal: Ability to maintain clinical measurements within normal limits will improve Outcome: Progressing Goal: Will remain free from infection Outcome: Progressing Goal: Diagnostic test results will improve Outcome: Progressing Goal: Respiratory complications will improve Outcome: Progressing Goal: Cardiovascular complication will be avoided Outcome: Progressing   Problem: Activity: Goal: Risk for activity intolerance will decrease Outcome: Progressing   Problem: Nutrition: Goal: Adequate nutrition will be maintained Outcome: Progressing   Problem: Coping: Goal: Level of anxiety will decrease Outcome: Progressing   Problem: Elimination: Goal: Will not experience complications related to bowel motility Outcome: Progressing Goal: Will not experience complications related to urinary retention Outcome: Progressing   Problem: Pain Managment: Goal: General experience of comfort will improve Outcome: Progressing   Problem: Safety: Goal: Ability to remain free from injury will improve Outcome: Progressing   Problem: Skin Integrity: Goal: Risk for impaired skin integrity will decrease Outcome: Progressing   Problem: Activity: Goal: Ability to tolerate increased activity will improve Outcome: Progressing   Problem: Clinical Measurements: Goal: Ability to maintain a body temperature in the normal range will improve Outcome: Progressing   Problem: Respiratory: Goal: Ability to maintain adequate ventilation will improve Outcome: Progressing Goal: Ability to maintain a clear airway will improve Outcome: Progressing   

## 2019-01-16 NOTE — Progress Notes (Signed)
[place] at [place] NAME: Davaris Youtsey    MR#:  [id number]  DATE OF BIRTH:  09-15-1946  SUBJECTIVE:   Patient is still on high flow 5 l of oxygen.  Used BiPAP last night Urine stream is better after Flomax started  Reports using 2 L of oxygen as needed at home prior to this admission  REVIEW OF SYSTEMS:    Review of Systems  Constitutional: Negative for fever, chills weight loss HENT: Negative for ear pain, nosebleeds, congestion, facial swelling, rhinorrhea, neck pain, neck stiffness and ear discharge.   Respiratory: no cough, ++IMPROVED shortness of breath, no wheezing  Cardiovascular: Negative for chest pain, palpitations and + MILD leg swelling.  Gastrointestinal: Negative for heartburn, abdominal pain, vomiting, diarrhea or consitpation Genitourinary: Negative for dysuria, urgency, frequency, hematuria Musculoskeletal: Negative for back pain or joint pain Neurological: Negative for dizziness, seizures, syncope, focal weakness,  numbness and headaches.  Hematological: Does not bruise/bleed easily.  Psychiatric/Behavioral: Negative for hallucinations, confusion, dysphoric mood    Tolerating Diet: yes      DRUG ALLERGIES:  No Known Allergies  VITALS:  Blood pressure (!) 141/42, pulse 64, temperature 97.8 F (36.6 C), temperature source Oral, resp. rate 20, height 5\' 11"  (1.803 m), weight 101.4 kg, SpO2 96 %.  PHYSICAL EXAMINATION:  Constitutional: Appears well-developed and well-nourished. No distress. HENT: Normocephalic. . Eyes: Conjunctivae and EOM are normal. PERRLA, no scleral icterus.  Neck: Normal ROM. Neck supple. No JVD. No tracheal deviation. CVS: RRR, S1/S2 +, 3/6  murmurs, no gallops, no carotid bruit.  Pulmonary: Normal respiratory effort with some mild rhonchi right upper lobe.   Abdominal: Soft. BS +,  no distension, tenderness, rebound or guarding.  Musculoskeletal: Normal range of motion. 1+ LE edema and no  tenderness.  Neuro: Alert. CN 2-12 grossly intact. No focal deficits. Skin: Skin is warm and dry. No rash noted. Psychiatric: Normal mood and affect.      LABORATORY PANEL:   CBC Recent Labs  Lab 01/16/19 0308  WBC 12.2*  HGB 7.1*  HCT 24.1*  PLT 290   ------------------------------------------------------------------------------------------------------------------  Chemistries  Recent Labs  Lab 01/14/19 0352  NA 142  K 4.9  CL 107  CO2 28  GLUCOSE 150*  BUN 60*  CREATININE 1.59*  CALCIUM 8.9   ------------------------------------------------------------------------------------------------------------------  Cardiac Enzymes No results for input(s): TROPONINI in the last 168 hours.------------------------------------------------------------------------------------------------------------------  RADIOLOGY:  Dg Chest Port 1 View  Result Date: 01/15/2019 CLINICAL DATA:  73 year old male with shortness of breath. Negative for COVID-19 on 01/03/2019. EXAM: PORTABLE CHEST 1 VIEW COMPARISON:  01/10/2019 and earlier. FINDINGS: Portable AP upright view at 0342 hours. Stable cardiomegaly and mediastinal contours. Prior TAVR. Calcified aortic atherosclerosis. Stable left chest AICD. Coarse bilateral pulmonary interstitial opacity is confluent in the right upper lobe and at the left lung base. Ventilation has worsened since 01/08/2019, and the right upper lobe opacity appears mildly progressed since yesterday. No superimposed pneumothorax. Small left pleural effusion is difficult to exclude. IMPRESSION: 1. Coarse bilateral pulmonary opacity with mild progression in the right upper lobe since yesterday. 2. Stable cardiomegaly. Aortic Atherosclerosis (ICD10-I70.0). Electronically Signed   By: Genevie Ann M.D.   On: 01/15/2019 06:00     ASSESSMENT AND PLAN:   73 year old male with a history of PAF, EtOH abuse and COPD who presented to the emergency room due to generalized weakness and  found to have elevated creatinine.   1.  Acute on chronic kidney disease stage  III due to EtOH abuse and dehydration: Creatinine has improved  .2.  Acute on chronic hypoxic respiratory failure: Patient was transferred to ICU and placed on BiPAP and then transferred out of ICU however back to stepdown unit 4/13 due to increasing shortness of breath and hypoxia. Acute hypoxic respiratory failure with worsening O2 requirements due to right upper lobe/left lower lobe hospital-acquired pneumonia and acute on chronic diastolic heart failure.   Increase Lasix dose to 80 mg twice daily as patient still has significant lower extremity edema and bilateral rales and rhonchi.  Fluid restriction to 1200 cc/day Still on high flow 5  L will try to wean off.  Patient uses 2 L of oxygen at home as needed  If no clinical improvement will consider LTAC patient is agreeable but prefers talking to social worker first [place] pulmonology follow-up Patient received cefepime and vancomycin for 7 days total.  Completed antibiotic course Patient has tested negative for COVID-19.  Respiratory viral panel was also negative. MRSA PCR was negative.  IV albumin given -Flutter valve device   3.  Acute on chronic diastolic heart failure:  Continue IV Lasix 80 milligrams twice daily Monitor intake and output -5092 in the past 24 hours and 11,755 since admission  4.  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  Increasing Lasix dose to 80 mg iv twice daily.  If no improvement will consider Lasix drip .  Continue close monitoring and avoid nephrotoxins -We will consider nephrology consult if needed otherwise outpatient nephrology follow-up  5.  PAF, history of aortic stenosis status post TAVR: Patient currently in sinus rhythm Continue Eliquis.  Resume home medication amiodarone  6.  COPD without signs of exacerbation: Continue  nebs  7.  Essential hypertension: Continue Norvasc  8.  Anemia of chronic disease with  hemoglobin at 7.1 today.   We will transfuse 1 unit of blood  MCV 99.6  check anemia labs vitamin B12 767 ferritin, folate, serum iron levels are normal  #Dysuria with history of bladder cancer- Status post cystoscopy care at [facility] urology PSA normal Flomax Discussed with urology Dr. Jeb Levering agreeable with Flomax.  Post void residual is less than 200.  Not recommending Foley catheter and recommended outpatient follow-[facility] with [facility] u[facility]  #Outpatient follow-up with podiatry for nail care as per my discussion with Dr. Vickki Muff  #Prolonged hospital course requiring high flow oxygen Patient is agreeable to LTAC if no clinical improvement by Monday Case management consult placed   Management plans discussed with the patient and he is in agreement.  CODE STATUS: full  TOTAL TIME TAKING CARE OF THIS PATIENT: 35 minutes.     POSSIBLE D/c ? with HHC if we can get O2 down.  If no clinical improvement LTAC is an option will discuss with the patient  Nicholes Mango M.D on 01/16/2019 at 1:48 PM  Between 7am to 6pm - Pager - [phone] After 6pm go to [email or web address] - password [email or web address] [facility]  Office  [phone]  CC: Primary care physician; [employer]  Note: This dictation was prepared with Diplomatic Services operational officer dictation along with smaller Company secretary. Any transcriptional errors that result from this process are unintentional.

## 2019-01-17 LAB — TYPE AND SCREEN
ABO/RH(D): O POS
Antibody Screen: NEGATIVE
Unit division: 0

## 2019-01-17 LAB — CBC
HCT: 25.7 % — ABNORMAL LOW (ref 39.0–52.0)
Hemoglobin: 7.8 g/dL — ABNORMAL LOW (ref 13.0–17.0)
MCH: 29.9 pg (ref 26.0–34.0)
MCHC: 30.4 g/dL (ref 30.0–36.0)
MCV: 98.5 fL (ref 80.0–100.0)
Platelets: 259 10*3/uL (ref 150–400)
RBC: 2.61 MIL/uL — ABNORMAL LOW (ref 4.22–5.81)
RDW: 18.6 % — ABNORMAL HIGH (ref 11.5–15.5)
WBC: 12.5 10*3/uL — ABNORMAL HIGH (ref 4.0–10.5)
nRBC: 0.5 % — ABNORMAL HIGH (ref 0.0–0.2)

## 2019-01-17 LAB — GLUCOSE, CAPILLARY
Glucose-Capillary: 95 mg/dL (ref 70–99)
Glucose-Capillary: 152 mg/dL — ABNORMAL HIGH (ref 70–99)
Glucose-Capillary: 114 mg/dL — ABNORMAL HIGH (ref 70–99)
Glucose-Capillary: 161 mg/dL — ABNORMAL HIGH (ref 70–99)

## 2019-01-17 LAB — BASIC METABOLIC PANEL
Anion gap: 8 (ref 5–15)
BUN: 65 mg/dL — ABNORMAL HIGH (ref 8–23)
CO2: 29 mmol/L (ref 22–32)
Calcium: 8.7 mg/dL — ABNORMAL LOW (ref 8.9–10.3)
Chloride: 107 mmol/L (ref 98–111)
Creatinine, Ser: 1.56 mg/dL — ABNORMAL HIGH (ref 0.61–1.24)
GFR calc Af Amer: 50 mL/min — ABNORMAL LOW (ref >60)
GFR calc non Af Amer: 43 mL/min — ABNORMAL LOW (ref >60)
Glucose, Bld: 95 mg/dL (ref 70–99)
Potassium: 4.1 mmol/L (ref 3.5–5.1)
Sodium: 144 mmol/L (ref 135–145)

## 2019-01-17 LAB — BPAM RBC
Blood Product Expiration Date: 202004262359
ISSUE DATE / TIME: 202004241358
Unit Type and Rh: 5100

## 2019-01-17 NOTE — Progress Notes (Signed)
[place] at [place] NAME: Darius Taylor    MR#:  [id number]  DATE OF BIRTH:  01-17-1946  SUBJECTIVE:   Patient is still on high flow 4.5 l of oxygen.  Using BiPAP during nights Stool for occult blood is positive denies any hematemesis or abdominal pain Urine stream is better after Flomax started  Reports using 2 L of oxygen as needed at home prior to this admission  REVIEW OF SYSTEMS:    Review of Systems  Constitutional: Negative for fever, chills weight loss HENT: Negative for ear pain, nosebleeds, congestion, facial swelling, rhinorrhea, neck pain, neck stiffness and ear discharge.   Respiratory: no cough, ++IMPROVED shortness of breath, no wheezing  Cardiovascular: Negative for chest pain, palpitations and + MILD leg swelling.  Gastrointestinal: Negative for heartburn, abdominal pain, vomiting, diarrhea or consitpation Genitourinary: Negative for dysuria, urgency, frequency, hematuria Musculoskeletal: Negative for back pain or joint pain Neurological: Negative for dizziness, seizures, syncope, focal weakness,  numbness and headaches.  Hematological: Does not bruise/bleed easily.  Psychiatric/Behavioral: Negative for hallucinations, confusion, dysphoric mood    Tolerating Diet: yes      DRUG ALLERGIES:  No Known Allergies  VITALS:  Blood pressure 137/71, pulse 63, temperature 98.3 F (36.8 C), temperature source Oral, resp. rate 19, height 5\' 11"  (1.803 m), weight 102.5 kg, SpO2 97 %.  PHYSICAL EXAMINATION:  Constitutional: Appears well-developed and well-nourished. No distress. HENT: Normocephalic. . Eyes: Conjunctivae and EOM are normal. PERRLA, no scleral icterus.  Neck: Normal ROM. Neck supple. No JVD. No tracheal deviation. CVS: RRR, S1/S2 +, 3/6  murmurs, no gallops, no carotid bruit.  Pulmonary: Normal respiratory effort with some mild rhonchi right upper lobe.   Abdominal: Soft. BS +,  no distension, tenderness,  rebound or guarding.  Musculoskeletal: Normal range of motion. 1+ LE edema and no tenderness.  Neuro: Alert. CN 2-12 grossly intact. No focal deficits. Skin: Skin is warm and dry. No rash noted. Psychiatric: Normal mood and affect.      LABORATORY PANEL:   CBC Recent Labs  Lab 01/17/19 0623  WBC 12.5*  HGB 7.8*  HCT 25.7*  PLT 259   ------------------------------------------------------------------------------------------------------------------  Chemistries  Recent Labs  Lab 01/17/19 0623  NA 144  K 4.1  CL 107  CO2 29  GLUCOSE 95  BUN 65*  CREATININE 1.56*  CALCIUM 8.7*   ------------------------------------------------------------------------------------------------------------------  Cardiac Enzymes No results for input(s): TROPONINI in the last 168 hours.------------------------------------------------------------------------------------------------------------------  RADIOLOGY:  No results found.   ASSESSMENT AND PLAN:   73 year old male with a history of PAF, EtOH abuse and COPD who presented to the emergency room due to generalized weakness and found to have elevated creatinine.   1.  Acute on chronic kidney disease stage III due to EtOH abuse and dehydration: Creatinine has improved  Cr at 1.56.  Continue close monitoring while patient is on high-dose Lasix  .2.  Acute on chronic hypoxic respiratory failure: Patient was transferred to ICU and placed on BiPAP and then transferred out of ICU however back to stepdown unit 4/13 due to increasing shortness of breath and hypoxia. Acute hypoxic respiratory failure with worsening O2 requirements due to right upper lobe/left lower lobe hospital-acquired pneumonia and acute on chronic diastolic heart failure.   Increase Lasix dose to 80 mg twice daily as patient still has significant lower extremity edema and bilateral rales and rhonchi.  Fluid restriction to 1200 cc/day Still on high flow 5  L will try to  wean  off.  Patient uses 2 L of oxygen at home as needed  If no clinical improvement will consider LTAC patient is agreeable but prefers talking to social worker first [place] pulmonology follow-up Patient received cefepime and vancomycin for 7 days total.  Completed antibiotic course Patient has tested negative for COVID-19.  Respiratory viral panel was also negative. MRSA PCR was negative.  IV albumin given -Flutter valve device   3.  Acute on chronic diastolic heart failure:  Continue IV Lasix 80 milligrams twice daily Monitor intake and output -1771 in the past 24 hours and 13,816 since admission  4.  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  Increasing Lasix dose to 80 mg iv twice daily.  If no improvement will consider Lasix drip .  Continue close monitoring and avoid nephrotoxins -We will consider nephrology consult if needed otherwise outpatient nephrology follow-up  5.  PAF, history of aortic stenosis status post TAVR: Patient currently in sinus rhythm Continue Eliquis.  Resume home medication amiodarone Stool for occult blood is positive could be from Eliquis use, will hold it today  6.  COPD without signs of exacerbation: Continue nebs  7.  Hypertension continue current medications and titrate as needed  8.  Anemia of chronic disease    Status post transfusion of 1 unit of blood hemoglobin at 7.9 today Stool for occult blood is positive Hold Eliquis GI consult placed and notified Dr. Alice Reichert is aware Clear liquid diet for now  MCV 99.6   anemia labs vitamin B12 767 ferritin, folate, serum iron levels are normal  #Dysuria with history of bladder cancer- Status post cystoscopy care at [facility] urology PSA normal Flomax Discussed with urology Dr. Jeb Levering agreeable with Flomax.  Post void residual is less than 200.  Not recommending Foley catheter and recommended outpatient follow-up  with [facility] urology  #Outpatient follow-up with podiatry for nail care as per my discussion with Dr. Vickki Muff  #Prolonged hospital course requiring high flow oxygen Patient is agreeable to LTAC if no clinical improvement by Monday Case management consult placed   Management plans discussed with the patient and he is in agreement.  CODE STATUS: full  TOTAL TIME TAKING CARE OF THIS PATIENT: 34 minutes.     POSSIBLE D/c ? with HHC if we can get O2 down.  If no clinical improvement LTAC is an option will discuss with the patient  Nicholes Mango M.D on 01/17/2019 at 1:48 PM  Between 7am to 6pm - Pager - [phone] After 6pm go to [email or web address] - password [email or web address] [facility]  Office  [phone]  CC: Primary care physician; [employer]  Note: This dictation was prepared with Diplomatic Services operational officer dictation along with smaller Company secretary. Any transcriptional errors that result from this process are unintentional.

## 2019-01-17 NOTE — Progress Notes (Signed)
Pulmonary Medicine          Date: 01/17/2019,   MRN# [id number] KENNY REA 1946-04-30     AdmissionWeight: 99.8 kg                 CurrentWeight: 102.5 kg        SUBJECTIVE   Patient clinically improved , overnight >1.5L diuresis.  GFR stable, LE edema improved.  Weaning O2.    PAST MEDICAL HISTORY   Past Medical History:  Diagnosis Date   Aortic stenosis    Arrhythmia    atrial fibrillation   Asthma    Atrial fibrillation (HCC)    CAD (coronary artery disease)    Cancer (HCC)    colon, bladder and kidney   CHF (congestive heart failure) (HCC)    Colon adenocarcinoma (HCC)    COPD (chronic obstructive pulmonary disease) ([place])    H/O ventricular tachycardia    Hypertension    Nonischemic cardiomyopathy ([place])    Status post AICD and pacemaker   Renal cell carcinoma ([place])      SURGICAL HISTORY   Past Surgical History:  Procedure Laterality Date   CARDIAC DEFIBRILLATOR PLACEMENT     CARDIAC DEFIBRILLATOR PLACEMENT     COLONOSCOPY WITH PROPOFOL N/A 06/10/2018   Procedure: COLONOSCOPY WITH PROPOFOL;  Surgeon: Lin Landsman, MD;  Location: [facility];  Service: Gastroenterology;  Laterality: N/A;   CORONARY ANGIOPLASTY  09/25/2016   IRRIGATION AND DEBRIDEMENT HEMATOMA Left 10/24/2016   Procedure: IRRIGATION AND DEBRIDEMENT HEMATOMA;  Surgeon: Florene Glen, MD;  Location: [facility] ORS;  Service: General;  Laterality: Left;   KIDNEY SURGERY     left ne[hrectomy for RCC   PACEMAKER IMPLANT     PARTIAL COLECTOMY       FAMILY HISTORY   Family History  Problem Relation Age of Onset   Hypertension Mother    Stroke Father      SOCIAL HISTORY   Social History   Tobacco Use   Smoking status: Former Smoker   Smokeless tobacco: Never Used  Substance Use Topics   Alcohol use: No   Drug use: No     MEDICATIONS    Home Medication:    Current Medication:  Current Facility-Administered Medications:      0.9 %  sodium chloride infusion (Manually program via Guardrails IV Fluids), , Intravenous, Once, Gouru, Aruna, MD   0.9 %  sodium chloride infusion, , Intravenous, PRN, Gouru, Aruna, MD, Stopped at 01/11/19 0047   acetaminophen (TYLENOL) tablet 650 mg, 650 mg, Oral, Q6H PRN, 650 mg at 01/17/19 0552 **OR** acetaminophen (TYLENOL) suppository 650 mg, 650 mg, Rectal, Q6H PRN, Lance Coon, MD   albuterol (PROVENTIL) (2.5 MG/3ML) 0.083% nebulizer solution 2.5 mg, 2.5 mg, Nebulization, Q4H PRN, Gouru, Aruna, MD, 2.5 mg at 01/14/19 0502   amiodarone (PACERONE) tablet 200 mg, 200 mg, Oral, Daily, Gouru, Aruna, MD, 200 mg at 01/17/19 0923   amLODipine (NORVASC) tablet 10 mg, 10 mg, Oral, Daily, Lance Coon, MD, 10 mg at 01/17/19 9937   apixaban (ELIQUIS) tablet 5 mg, 5 mg, Oral, BID, Lance Coon, MD, 5 mg at 01/17/19 1696   aspirin chewable tablet 81 mg, 81 mg, Oral, Daily, Lance Coon, MD, 81 mg at 01/17/19 7893   docusate sodium (COLACE) capsule 100 mg, 100 mg, Oral, Daily, Fritzi Mandes, MD, 100 mg at 01/17/19 0829   feeding supplement (NEPRO CARB STEADY) liquid 237 mL, 237 mL, Oral, Q24H, Gouru, Aruna, MD, 237 mL at  73/71/06 2694   folic acid (FOLVITE) tablet 1 mg, 1 mg, Oral, Daily, Lance Coon, MD, 1 mg at 01/17/19 0829   furosemide (LASIX) injection 80 mg, 80 mg, Intravenous, BID, Gouru, Aruna, MD, 80 mg at 01/17/19 0828   guaiFENesin-dextromethorphan (ROBITUSSIN DM) 100-10 MG/5ML syrup 5 mL, 5 mL, Oral, Q4H PRN, Gouru, Aruna, MD, 5 mL at 01/02/19 0618   insulin aspart (novoLOG) injection 0-15 Units, 0-15 Units, Subcutaneous, TID WC, Wilhelmina Mcardle, MD, 3 Units at 01/16/19 1735   insulin aspart (novoLOG) injection 0-5 Units, 0-5 Units, Subcutaneous, QHS, Wilhelmina Mcardle, MD, 2 Units at 01/11/19 2133   ipratropium-albuterol (DUONEB) 0.5-2.5 (3) MG/3ML nebulizer solution 3 mL, 3 mL, Nebulization, Q6H, Gouru, Aruna, MD, 3 mL at 01/17/19 0813   magnesium oxide (MAG-OX)  tablet 400 mg, 400 mg, Oral, BID, Lance Coon, MD, 400 mg at 01/17/19 8546   MEDLINE mouth rinse, 15 mL, Mouth Rinse, BID, Wilhelmina Mcardle, MD, 15 mL at 01/16/19 1334   metoprolol succinate (TOPROL-XL) 24 hr tablet 100 mg, 100 mg, Oral, Daily, Lanney Gins, Germain Koopmann, MD, 100 mg at 01/17/19 2703   multivitamin with minerals tablet 1 tablet, 1 tablet, Oral, Daily, Lance Coon, MD, 1 tablet at 01/17/19 5009   [DISCONTINUED] ondansetron [facility]) tablet 4 mg, 4 mg, Oral, Q6H PRN, 4 mg at 01/03/19 1407 **OR** ondansetron (ZOFRAN) injection 4 mg, 4 mg, Intravenous, Q6H PRN, Lance Coon, MD, 4 mg at 01/12/19 0421   potassium chloride 20 MEQ/15ML (10%) solution 20 mEq, 20 mEq, Oral, Daily, Mody, Sital, MD, 20 mEq at 01/17/19 0924   sodium chloride flush (NS) 0.9 % injection 10 mL, 10 mL, Intravenous, Q12H, Gouru, Aruna, MD, 10 mL at 01/17/19 3818   tamsulosin (FLOMAX) capsule 0.4 mg, 0.4 mg, Oral, Daily, Gouru, Aruna, MD, 0.4 mg at 01/17/19 2993   thiamine (VITAMIN B-1) tablet 100 mg, 100 mg, Oral, Daily, 100 mg at 01/17/19 0829 **OR** [DISCONTINUED] thiamine (B-1) injection 100 mg, 100 mg, Intravenous, Daily, Lance Coon, MD, 100 mg at 01/04/19 0908    ALLERGIES   Patient has no known allergies.     REVIEW OF SYSTEMS    Review of Systems:  Gen:  Denies  fever, sweats, chills weigh loss  HEENT: Denies blurred vision, double vision, ear pain, eye pain, hearing loss, nose bleeds, sore throat Cardiac:  No dizziness, chest pain or heaviness, chest tightness,edema Resp:   Denies cough or sputum porduction, shortness of breath,wheezing, hemoptysis,  Gi: Denies swallowing difficulty, stomach pain, nausea or vomiting, diarrhea, constipation, bowel incontinence Gu:  Denies bladder incontinence, burning urine Ext:   Denies Joint pain, stiffness or swelling Skin: Denies  skin rash, easy bruising or bleeding or hives Endoc:  Denies polyuria, polydipsia , polyphagia or weight change Psych:    Denies depression, insomnia or hallucinations   Other:  All other systems negative   VS: BP 137/71 (BP Location: Left Arm)    Pulse 63    Temp 98.3 F (36.8 C) (Oral)    Resp 19    Ht 5\' 11"  (1.803 m)    Wt 102.5 kg    SpO2 97%    BMI 31.52 kg/m      PHYSICAL EXAM    GENERAL:NAD, no fevers, chills, no weakness no fatigue HEAD: Normocephalic, atraumatic.  EYES: Pupils equal, round, reactive to light. Extraocular muscles intact. No scleral icterus.  MOUTH: Moist mucosal membrane. Dentition intact. No abscess noted.  EAR, NOSE, THROAT: Clear without exudates. No external lesions.  NECK:  Supple. No thyromegaly. No nodules. No JVD.  PULMONARY: no wheezing or rhonhci, + bibasialr crackles CARDIOVASCULAR: S1 and S2. Regular rate and rhythm. No murmurs, rubs, or gallops. No edema. Pedal pulses 2+ bilaterally.  GASTROINTESTINAL: Soft, nontender, nondistended. No masses. Positive bowel sounds. No hepatosplenomegaly.  MUSCULOSKELETAL: No swelling, clubbing, or edema. Range of motion full in all extremities.  NEUROLOGIC: Cranial nerves II through XII are intact. No gross focal neurological deficits. Sensation intact. Reflexes intact.  SKIN: No ulceration, lesions, rashes, or cyanosis. Skin warm and dry. Turgor intact.  PSYCHIATRIC: Mood, affect within normal limits. The patient is awake, alert and oriented x 3. Insight, judgment intact.       IMAGING    Dg Chest 1 View  Result Date: 01/10/2019 CLINICAL DATA:  Pneumonia. Hx of asthma, A-fib, CAD CHF, COPD, HTN. EXAM: CHEST  1 VIEW COMPARISON:  01/09/19 FINDINGS: LEFT-sided pacemaker overlies stable enlarged cardiac silhouette. The diffuse lobar airspace disease in the RIGHT upper lobe. Mild airspace disease in the RIGHT lower lobe. No focal consolidation. No pneumothorax. Chronic elevation LEFT hemidiaphragm. IMPRESSION: 1. No significant interval change. 2. RIGHT lung asymmetric airspace disease representing asymmetric edema versus asymmetric  pulmonary infection. Electronically Signed   By: Suzy Bouchard M.D.   On: 01/10/2019 07:50   Dg Chest 1 View  Result Date: 12/31/2018 CLINICAL DATA:  SOB. EXAM: CHEST  1 VIEW COMPARISON:  12/30/2018. FINDINGS: Marked cardiac enlargement. Dual lead pacer/AICD. Mild vascular congestion. No overt edema. Chronic elevation LEFT hemidiaphragm. No pneumothorax. Aortic atherosclerosis. IMPRESSION: Cardiomegaly. Mild vascular congestion without overt edema. Stable appearance from priors. Electronically Signed   By: Staci Righter M.D.   On: 12/31/2018 08:20   Dg Abd 1 View  Result Date: 01/05/2019 CLINICAL DATA:  Nausea and vomiting EXAM: ABDOMEN - 1 VIEW COMPARISON:  09/28/2016 FINDINGS: There is a large amount of stool in the ascending and transverse colon. The bowel gas pattern is normal. No radio-opaque calculi or other significant radiographic abnormality are seen. Mild osteoarthritis of the right hip. IMPRESSION: Large amount of stool in the ascending and transverse colon. Electronically Signed   By: Kathreen Devoid   On: 01/05/2019 19:42   Ct Chest Wo Contrast  Result Date: 01/02/2019 CLINICAL DATA:  Dyspnea.  History of colon cancer. EXAM: CT CHEST WITHOUT CONTRAST TECHNIQUE: Multidetector CT imaging of the chest was performed following the standard protocol without IV contrast. COMPARISON:  Radiographs of December 31, 2018. CT scan of November 10, 2018. FINDINGS: Cardiovascular: Atherosclerosis of thoracic aorta is noted without aneurysm formation. Mild cardiomegaly is noted. Status post transcatheter aortic valve replacement. Coronary artery calcifications are noted. Mediastinum/Nodes: No enlarged mediastinal or axillary lymph nodes. Thyroid gland, trachea, and esophagus demonstrate no significant findings. Lungs/Pleura: No pneumothorax or pleural effusion is noted. Moderate left lower lobe subsegmental atelectasis or pneumonia is noted which is increased compared to prior exam. Mild right posterior basilar  subsegmental atelectasis or inflammation is noted posteriorly in right lower lobe. New ground-glass opacity seen posteriorly in the right upper lobe concerning for pneumonia. Upper Abdomen: No acute abnormality. Musculoskeletal: No chest wall mass or suspicious bone lesions identified. IMPRESSION: New ground-glass opacity seen posteriorly in right upper lobe concerning for pneumonia; this may represent typical or atypical infection, including viral etiology. Also noted are increased bilateral lower lobe opacities concerning for worsening atelectasis or pneumonia, left worse than right. Coronary artery calcifications are noted. Aortic Atherosclerosis (ICD10-I70.0). Electronically Signed   By: Marijo Conception, M.D.   On: 01/02/2019  16:18   Dg Chest Port 1 View  Result Date: 01/15/2019 CLINICAL DATA:  73 year old male with shortness of breath. Negative for COVID-19 on 01/03/2019. EXAM: PORTABLE CHEST 1 VIEW COMPARISON:  01/10/2019 and earlier. FINDINGS: Portable AP upright view at 0342 hours. Stable cardiomegaly and mediastinal contours. Prior TAVR. Calcified aortic atherosclerosis. Stable left chest AICD. Coarse bilateral pulmonary interstitial opacity is confluent in the right upper lobe and at the left lung base. Ventilation has worsened since 01/08/2019, and the right upper lobe opacity appears mildly progressed since yesterday. No superimposed pneumothorax. Small left pleural effusion is difficult to exclude. IMPRESSION: 1. Coarse bilateral pulmonary opacity with mild progression in the right upper lobe since yesterday. 2. Stable cardiomegaly. Aortic Atherosclerosis (ICD10-I70.0). Electronically Signed   By: Genevie Ann M.D.   On: 01/15/2019 06:00   Dg Chest Port 1 View  Result Date: 01/09/2019 CLINICAL DATA:  Acute respiratory failure. EXAM: PORTABLE CHEST 1 VIEW COMPARISON:  Radiographs 01/08/2019 and 01/06/2019.  CT 01/02/2019. FINDINGS: 0937 hours. The left subclavian pacemaker leads appear stable. There  is stable cardiomegaly and aortic atherosclerosis post TAVR. They were stable asymmetric airspace opacities in the right upper lobe and left lung base. No pneumothorax or significant pleural effusion. The bones appear unchanged. IMPRESSION: No significant change in asymmetric bilateral airspace opacities, presumably infectious/inflammatory. Stable cardiomegaly. Electronically Signed   By: Richardean Sale M.D.   On: 01/09/2019 10:24   Dg Chest Port 1 View  Result Date: 01/08/2019 CLINICAL DATA:  Respiratory failure. EXAM: PORTABLE CHEST 1 VIEW COMPARISON:  One-view chest x-ray 01/06/2019 FINDINGS: AND: FINDINGS: AND Heart is enlarged. Aortic valve replacement is noted. Atherosclerotic changes are present in the aorta. Pacing and defibrillator wires are stable. There is improving aeration of both lungs. Right upper lobe and left lower lobe airspace consolidation remain. Mild pulmonary vascular congestion is noted. IMPRESSION: 1. Slight improved aeration of both lungs with persistent right upper lobe and left lower lobe pneumonia. 2. Cardiomegaly with mild respiratory changes. Electronically Signed   By: San Morelle M.D.   On: 01/08/2019 06:40   Dg Chest Port 1 View  Result Date: 01/06/2019 CLINICAL DATA:  Respiratory failure. EXAM: PORTABLE CHEST 1 VIEW COMPARISON:  Chest x-ray from yesterday. FINDINGS: Unchanged left chest wall AICD. Stable cardiomegaly status post TAVR. Atherosclerotic calcification of the aortic arch. Unchanged pulmonary vascular congestion and interstitial thickening. Increased patchy opacity in the right upper lobe. Unchanged left greater than right bibasilar atelectasis. No large pleural effusion. No pneumothorax. No acute osseous abnormality. IMPRESSION: 1. Increasing patchy opacity in the right upper lobe, concerning for pneumonia. 2. Unchanged mild pulmonary interstitial edema and bibasilar atelectasis. Electronically Signed   By: Titus Dubin M.D.   On: 01/06/2019 11:14     Dg Chest Port 1 View  Result Date: 01/05/2019 CLINICAL DATA:  Respiratory failure. EXAM: PORTABLE CHEST 1 VIEW COMPARISON:  CT of the chest on 01/02/2019 and chest x-ray on 12/31/2018 FINDINGS: Stable cardiac enlargement, appearance of pacing/ICD device and appearance transcatheter aortic valve. Lungs show subtle opacities in the right upper lobe and both lung bases potentially representing pneumonia. There also may be a component of mild pulmonary interstitial edema and bibasilar atelectasis. No significant pleural effusion. No pneumothorax. IMPRESSION: Subtle opacities in the right upper lobe and both lung bases potentially representing multifocal pneumonia. There also may be a component of mild pulmonary interstitial edema and bibasilar atelectasis. Electronically Signed   By: Aletta Edouard M.D.   On: 01/05/2019 08:37   Dg Chest  Port 1 View  Result Date: 12/30/2018 CLINICAL DATA:  Progressive shortness of breath. EXAM: PORTABLE CHEST 1 VIEW COMPARISON:  Radiograph yesterday. CT 11/10/2018 FINDINGS: Similar cardiomegaly. Unchanged mediastinal contours. Aortic atherosclerosis with aortic root replacement. Left-sided pacemaker remains in place. Vascular congestion without overt pulmonary edema. Mild elevation of left hemidiaphragm. Improved atelectasis in the left mid lung. No large pleural effusion. No pneumothorax. Unchanged osseous structures. IMPRESSION: Cardiomegaly with vascular congestion. No overt pulmonary edema. Improved left midlung atelectasis. Aortic Atherosclerosis (ICD10-I70.0). Electronically Signed   By: Keith Rake M.D.   On: 12/30/2018 02:01   Dg Chest Portable 1 View  Result Date: 12/29/2018 CLINICAL DATA:  Weakness with apparent hypotension EXAM: PORTABLE CHEST 1 VIEW COMPARISON:  Chest radiograph November 05, 2018 and chest CT November 10, 2018 FINDINGS: There is atelectatic change in the left mid lung. There is no appreciable edema or consolidation. Heart is mildly enlarged  with pulmonary vascularity normal. Pacemaker leads are attached the right atrium and right ventricle. There is aortic atherosclerosis. No adenopathy. No bone lesions. There is calcification in each carotid artery. IMPRESSION: Left midlung atelectasis. No edema or consolidation. Mild cardiomegaly. Pacemaker leads attached to right atrium and right ventricle. Aortic Atherosclerosis (ICD10-I70.0). Bilateral carotid artery calcification noted. Electronically Signed   By: Lowella Grip III M.D.   On: 12/29/2018 21:14   [place] Ekg Site Rite  Result Date: 01/14/2019 If Site Rite image not attached, placement could not be confirmed due to current cardiac rhythm.     ASSESSMENT/PLAN   Acute on chronic hypoxemic respiratory failure -acute decompensated diastolic CHF exacerbation -  -Continue diuresis, increasing to 80 twice daily Lasix-negative >1.5L overnight. -Continue strict I's and O's with 1200 cc fluid restriction -Discussed possible LTAC transfer if diuresis is slow due to increased length of stay at this point  -Bibasilar atelectasis-have reviewed with patient appropriate incentive spirometry and Acapella device usage, encourage use each hour multiple times-discussed with Bambi RT appreciate input.  -Please perform exertional walk to delineate amount of oxygen needed to keep SPO2 over 88%-in preparation to discharge home onincreasedoxygen    - will need outpatient workup for pulmonary hypertension via [facility] - RVSP was significantly elevated on TTE will discuss with cardiology - appreciate input  -appreciate physical therapy input     COPD -continue nebulizer therapy - IS/ Chest PT - no need for steroidsat this time   Thank you for allowing me to participate in the care of this patient.   Patient/Family are satisfied with care plan and all questions have been answered.  This document was prepared using  Dragon voice recognition software and may include unintentional dictation errors.     Ottie Glazier, M.D.  Division of [facility]

## 2019-01-17 NOTE — Progress Notes (Signed)
PT Cancellation Note  Patient Details Name: Darius Taylor MRN: [id number] DOB: [birth year]   Cancelled Treatment:    Reason Eval/Treat Not Completed: Medical issues which prohibited therapy;Patient declined, no reason specified at 11:35 patient asked for PT to come after lunch. After lunch patient is asleep on bipap, per RN hold  Shelton Silvas PT, DPT Shelton Silvas 01/17/2019, 3:19 PM

## 2019-01-17 NOTE — Plan of Care (Signed)
  Problem: Health Behavior/Discharge Planning: Goal: Ability to manage health-related needs will improve Outcome: Not Progressing Note:  Patient remains on increased oxygen as compared to what he states he uses at home. Considering L.T.A.C. placement. Will continue to monitor overall progression. Darius Taylor [facility]

## 2019-01-17 NOTE — Plan of Care (Signed)
  Problem: Health Behavior/Discharge Planning: Goal: Ability to manage health-related needs will improve Outcome: Progressing   Problem: Elimination: Goal: Will not experience complications related to bowel motility Outcome: Progressing Goal: Will not experience complications related to urinary retention Outcome: Progressing   Problem: Activity: Goal: Capacity to carry out activities will improve Outcome: Progressing   Problem: Cardiac: Goal: Ability to achieve and maintain adequate cardiopulmonary perfusion will improve Outcome: Progressing

## 2019-01-17 NOTE — Consult Note (Signed)
Darius Taylor   Darius Taylor, M.D.  Reason for Consult: Anemia, hemoccult positive stool.    Attending Requesting Consult: Darius Taylor, M.D.   History of Present Illness: Darius Taylor is a 73 y.o. male with history of paroxysmal atrial fibrillation as well as coronary artery disease with congestive heart failure, status post remote ICD placement, COPD, and chronic renal failure who has been hospitalized for the past 3 weeks for COPD exacerbation and CHF exacerbation and acute kidney injury.  The patient has a chronic anemia related to renal failure which is not changed in scope or magnitude of hemoglobin level in the past 2 weeks.  GI service has been called for positive Hemoccult testing in the setting of anemia to suggest patient may require endoluminal evaluation.  Patient denies any diarrhea, constipation, bright red blood per rectum or melena.  He has not seen blood in his stool.  He drinks a fair amount of alcohol at home In regards to GI history, the patient underwent colonoscopy and polypectomy on 04/06/2015 with a diagnosis of at least intramucosal adenocarcinoma.  Follow-up colonoscopy was done and tattoo marking was performed.  This resulted in scheduling and completion of a hemicolectomy on May 14, 2015.  Says he is done fine since that surgery and per documentation does not appear to have had a follow-up colonoscopy since that time. The patient currently requires nightly BiPAP to maintain oxygen saturations up above 90%.  His heart failure and COPD have improved with spirometry as well as with diuresis.  He still feels somewhat short of breath, however.  Past Medical History:  Past Medical History:  Diagnosis Date  . Aortic stenosis   . Arrhythmia    atrial fibrillation  . Asthma   . Atrial fibrillation ([place])   . CAD (coronary artery disease)   . Cancer [facility])    colon, bladder and kidney  . CHF (congestive heart failure) ([place])   . Colon  adenocarcinoma ([place])   . COPD (chronic obstructive pulmonary disease) ([place])   . H/O ventricular tachycardia   . Hypertension   . Nonischemic cardiomyopathy ([place])    Status post AICD and pacemaker  . Renal cell carcinoma ([place])     Problem List: Patient Active Problem List   Diagnosis Date Noted  . CAD (coronary artery disease) 12/30/2018  . Acute on chronic renal failure ([place]) 12/30/2018  . Alcoholic intoxication without complication ([place]) 43/32/9518  . CHF exacerbation ([place]) 11/03/2018  . Chronic diastolic heart failure ([place]) 09/24/2018  . HTN (hypertension) 09/24/2018  . Acute on chronic respiratory failure with hypoxemia ([place]) 08/30/2018  . History of colon cancer   . COPD (chronic obstructive pulmonary disease) ([place]) 01/08/2018  . Cellulitis 11/18/2017  . Hematoma     Past Surgical History: Past Surgical History:  Procedure Laterality Date  . CARDIAC DEFIBRILLATOR PLACEMENT    . CARDIAC DEFIBRILLATOR PLACEMENT    . COLONOSCOPY WITH PROPOFOL N/A 06/10/2018   Procedure: COLONOSCOPY WITH PROPOFOL;  Surgeon: Lin Landsman, MD;  Location: [facility] ENDOSCOPY;  Service: Gastroenterology;  Laterality: N/A;  . CORONARY ANGIOPLASTY  09/25/2016  . IRRIGATION AND DEBRIDEMENT HEMATOMA Left 10/24/2016   Procedure: IRRIGATION AND DEBRIDEMENT HEMATOMA;  Surgeon: Florene Glen, MD;  Location: [facility] ORS;  Service: General;  Laterality: Left;  . KIDNEY SURGERY     left ne[hrectomy for RCC  . PACEMAKER IMPLANT    . PARTIAL COLECTOMY      Allergies: No Known Allergies  Home Medications:  Medications Prior to Admission  Medication Sig Dispense Refill Last Dose  . acetaminophen (TYLENOL) 325 MG tablet Take 650 mg by mouth every 6 (six) hours as needed for mild pain or fever.    prn at prn  . acetylcysteine (MUCOMYST) 20 % nebulizer solution 7ml nebulizer twice a day  Dx:j44.1 30 mL 12 12/28/2018 at Unknown time  . allopurinol (ZYLOPRIM) 100 MG tablet Take 100 mg by mouth every morning.    12/28/2018 at 0730  . amiodarone (PACERONE) 200 MG tablet Take 200 mg by mouth daily.   12/28/2018 at [place]  . amLODipine (NORVASC) 10 MG tablet Take 10 mg by mouth daily.   12/28/2018 at 1930  . apixaban (ELIQUIS) 5 MG TABS tablet Take 5 mg by mouth 2 (two) times daily.   12/28/2018 at [place]  . aspirin 81 MG chewable tablet Chew 81 mg by mouth daily.   12/28/2018 at 0730  . atorvastatin (LIPITOR) 80 MG tablet Take 80 mg by mouth daily.   12/28/2018 at 0730  . brimonidine (ALPHAGAN) 0.15 % ophthalmic solution Place 1 drop into both eyes 3 (three) times daily.  12 12/28/2018 at 1930  . cholecalciferol (VITAMIN D) 1000 units tablet Take 1,000 Units by mouth daily.   12/28/2018 at 0730  . furosemide (LASIX) 40 MG tablet Take 40 mg by mouth See admin instructions. Take 1 tablet (40mg ) by mouth twice daily and 1 additional tablet (40mg ) by mouth if needed for excessive swelling or edema   12/28/2018 at 1930  . metoprolol (TOPROL-XL) 200 MG 24 hr tablet Take 200 mg by mouth daily.   12/28/2018 at 0730  . spironolactone (ALDACTONE) 25 MG tablet Take 12.5 mg by mouth daily.   12/28/2018 at 0730  . tiotropium (SPIRIVA HANDIHALER) 18 MCG inhalation capsule Place 1 capsule (18 mcg total) into inhaler and inhale daily for 30 days. 30 capsule 0 12/28/2018 at Unknown time  . budesonide-formoterol (SYMBICORT) 80-4.5 MCG/ACT inhaler Inhale 2 puffs into the lungs 2 (two) times daily. (Patient not taking: Reported on 12/30/2018) 1 Inhaler 12 Not Taking  . colchicine 0.6 MG tablet Take 1 tablet (0.6 mg total) by mouth daily. (Patient not taking: Reported on 12/30/2018) 30 tablet 11 Not Taking  . doxycycline (VIBRA-TABS) 100 MG tablet Take 1 tablet (100 mg total) by mouth every 12 (twelve) hours. (Patient not taking: Reported on 12/30/2018) 7 tablet 0 Not Taking  . latanoprost (XALATAN) 0.005 % ophthalmic solution Place 1 drop into both eyes at bedtime.  7 Not Taking at Unknown time  . magnesium oxide (MAG-OX) 400 MG tablet Take 400 mg by mouth 2  (two) times daily.   12/28/2018 at [place]  . nitroGLYCERIN (NITROSTAT) 0.4 MG SL tablet Place 0.4 mg under the tongue every 5 (five) minutes as needed for chest pain.   Not Taking at Unknown time   Home medication reconciliation was completed with the patient.   Scheduled Inpatient Medications:   . sodium chloride   Intravenous Once  . amiodarone  200 mg Oral Daily  . amLODipine  10 mg Oral Daily  . aspirin  81 mg Oral Daily  . docusate sodium  100 mg Oral Daily  . feeding supplement (NEPRO CARB STEADY)  237 mL Oral Q24H  . folic acid  1 mg Oral Daily  . furosemide  80 mg Intravenous BID  . insulin aspart  0-15 Units Subcutaneous TID WC  . insulin aspart  0-5 Units Subcutaneous QHS  . ipratropium-albuterol  3 mL Nebulization  Q6H  . magnesium oxide  400 mg Oral BID  . mouth rinse  15 mL Mouth Rinse BID  . metoprolol succinate  100 mg Oral Daily  . multivitamin with minerals  1 tablet Oral Daily  . potassium chloride  20 mEq Oral Daily  . sodium chloride flush  10 mL Intravenous Q12H  . tamsulosin  0.4 mg Oral Daily  . thiamine  100 mg Oral Daily    Continuous Inpatient Infusions:   . sodium chloride Stopped (01/11/19 0047)    PRN Inpatient Medications:  sodium chloride, acetaminophen **OR** acetaminophen, albuterol, guaiFENesin-dextromethorphan, [DISCONTINUED] ondansetron **OR** ondansetron (ZOFRAN) IV  Family History: family history includes Hypertension in his mother; Stroke in his father.   GI Family History: Negative.  Social History:   reports that he has quit smoking. He has never used smokeless tobacco. He reports that he does not drink alcohol or use drugs. The patient denies ETOH, tobacco, or drug use.    Review of Systems: Review of Systems - History obtained from the patient General ROS: positive for  - fatigue and weight gain negative for - fever or weight loss Psychological ROS: negative Ophthalmic ROS: negative Allergy and Immunology ROS:  negative Hematological and Lymphatic ROS: negative Respiratory ROS: positive for - cough, orthopnea and shortness of breath negative for - hemoptysis or wheezing Cardiovascular ROS: no chest pain or dyspnea on exertion Genito-Urinary ROS: no dysuria, trouble voiding, or hematuria Musculoskeletal ROS: negative Neurological ROS: no TIA or stroke symptoms Dermatological ROS: negative  Physical Examination: BP 138/63 (BP Location: Left Arm)   Pulse 63   Temp 98.2 F (36.8 C) (Oral)   Resp 20   Ht 5\' 11"  (1.803 m)   Wt 102.5 kg   SpO2 92%   BMI 31.52 kg/m  Physical Exam Vitals signs and nursing Taylor reviewed.  Constitutional:      General: He is not in acute distress.    Appearance: He is obese. He is ill-appearing. He is not toxic-appearing or diaphoretic.  HENT:     Head: Normocephalic and atraumatic.  Eyes:     Conjunctiva/sclera: Conjunctivae normal.     Pupils: Pupils are equal, round, and reactive to light.  Neck:     Musculoskeletal: Neck supple.  Cardiovascular:     Rate and Rhythm: Normal rate. Rhythm irregular.     Pulses: Normal pulses.     Heart sounds: No gallop.   Pulmonary:     Effort: No respiratory distress.     Breath sounds: No stridor. Rhonchi and rales present. No wheezing.  Chest:     Chest wall: No tenderness.  Abdominal:     General: There is distension.     Palpations: There is no mass.     Tenderness: There is no abdominal tenderness. There is no guarding or rebound.  Musculoskeletal:        General: Swelling present. No tenderness.     Right lower leg: Edema present.     Left lower leg: Edema present.  Lymphadenopathy:     Cervical: No cervical adenopathy.  Skin:    General: Skin is warm and dry.  Neurological:     General: No focal deficit present.     Mental Status: He is alert.  Psychiatric:        Mood and Affect: Mood normal.     Data: Lab Results  Component Value Date   WBC 12.5 (H) 01/17/2019   HGB 7.8 (L) 01/17/2019    HCT 25.7 (L)  01/17/2019   MCV 98.5 01/17/2019   PLT 259 01/17/2019   Recent Labs  Lab 01/15/19 0315 01/16/19 0308 01/17/19 0623  HGB 7.1* 7.1* 7.8*   Lab Results  Component Value Date   NA 144 01/17/2019   K 4.1 01/17/2019   CL 107 01/17/2019   CO2 29 01/17/2019   BUN 65 (H) 01/17/2019   CREATININE 1.56 (H) 01/17/2019   Lab Results  Component Value Date   ALT 83 (H) 01/05/2019   AST 70 (H) 01/05/2019   ALKPHOS 102 01/05/2019   BILITOT 1.0 01/05/2019   No results for input(s): APTT, INR, PTT in the last 168 hours. CBC Latest Ref Rng & Units 01/17/2019 01/16/2019 01/15/2019  WBC 4.0 - 10.5 K/uL 12.5(H) 12.2(H) 14.7(H)  Hemoglobin 13.0 - 17.0 g/dL 7.8(L) 7.1(L) 7.1(L)  Hematocrit 39.0 - 52.0 % 25.7(L) 24.1(L) 23.7(L)  Platelets 150 - 400 K/uL 259 290 325    STUDIES: No results found. @IMAGES @  Assessment: 1. Chronic anemia without acute lowering of Hgb or hemodymic changes. 2. Heme positive stool. DDx includes hemorrhoids, recurrent polyps, colon cancer. 3. Hx of colon cancer in a polypectomy specimen s/p hemicolectomy at [facility] on 11/04/1733.  4. Diastolic CHF. 5. Chronic hypoxic respiratory failure requiring BiPAP. 6 Pneumonia.  Recommendations: 1. Given lack of acute GI bleeding, I would forego invasive endoluminal evaluation at this time given excessive risk of anesthesia on patient's poor lung function and CHF. 2. I discussed briefly the possibility of doing these and patient does refuse to give consent due to his fear of potential complications. He is willing to reconsider in the future. 3. Continue current cardiopulmonary management. 4. Will follow progress peripherally. Call me in the interim for any acute changes.   Thank you for the consult. Please call with questions or concerns.  Olean Ree, "Lanny Hurst MD [facility] [place], [place] [address] [phone]  01/17/2019 7:35 PM

## 2019-01-18 ENCOUNTER — Inpatient Hospital Stay: Payer: Medicare HMO

## 2019-01-18 LAB — GLUCOSE, CAPILLARY
Glucose-Capillary: 98 mg/dL (ref 70–99)
Glucose-Capillary: 114 mg/dL — ABNORMAL HIGH (ref 70–99)
Glucose-Capillary: 164 mg/dL — ABNORMAL HIGH (ref 70–99)

## 2019-01-18 LAB — BASIC METABOLIC PANEL
Anion gap: 7 (ref 5–15)
BUN: 62 mg/dL — ABNORMAL HIGH (ref 8–23)
CO2: 30 mmol/L (ref 22–32)
Calcium: 8.7 mg/dL — ABNORMAL LOW (ref 8.9–10.3)
Chloride: 105 mmol/L (ref 98–111)
Creatinine, Ser: 1.6 mg/dL — ABNORMAL HIGH (ref 0.61–1.24)
GFR calc Af Amer: 48 mL/min — ABNORMAL LOW (ref >60)
GFR calc non Af Amer: 42 mL/min — ABNORMAL LOW (ref >60)
Glucose, Bld: 102 mg/dL — ABNORMAL HIGH (ref 70–99)
Potassium: 3.9 mmol/L (ref 3.5–5.1)
Sodium: 142 mmol/L (ref 135–145)

## 2019-01-18 LAB — CBC WITH DIFFERENTIAL/PLATELET
Abs Immature Granulocytes: 0.14 10*3/uL — ABNORMAL HIGH (ref 0.00–0.07)
Basophils Absolute: 0 10*3/uL (ref 0.0–0.1)
Basophils Relative: 0 %
Eosinophils Absolute: 0.2 10*3/uL (ref 0.0–0.5)
Eosinophils Relative: 2 %
HCT: 27.1 % — ABNORMAL LOW (ref 39.0–52.0)
Hemoglobin: 8.1 g/dL — ABNORMAL LOW (ref 13.0–17.0)
Immature Granulocytes: 1 %
Lymphocytes Relative: 9 %
Lymphs Abs: 1 10*3/uL (ref 0.7–4.0)
MCH: 29.7 pg (ref 26.0–34.0)
MCHC: 29.9 g/dL — ABNORMAL LOW (ref 30.0–36.0)
MCV: 99.3 fL (ref 80.0–100.0)
Monocytes Absolute: 1.1 10*3/uL — ABNORMAL HIGH (ref 0.1–1.0)
Monocytes Relative: 11 %
Neutro Abs: 8.2 10*3/uL — ABNORMAL HIGH (ref 1.7–7.7)
Neutrophils Relative %: 77 %
Platelets: 255 10*3/uL (ref 150–400)
RBC: 2.73 MIL/uL — ABNORMAL LOW (ref 4.22–5.81)
RDW: 18.4 % — ABNORMAL HIGH (ref 11.5–15.5)
WBC: 10.6 10*3/uL — ABNORMAL HIGH (ref 4.0–10.5)
nRBC: 0.2 % (ref 0.0–0.2)

## 2019-01-18 MED ORDER — APIXABAN 5 MG PO TABS
5.0000 mg | ORAL_TABLET | Freq: Two times a day (BID) | ORAL | Status: DC
  Administered 2019-01-18 – 2019-01-21 (×7): 5 mg via ORAL
  Filled 2019-01-18 (×7): qty 1

## 2019-01-18 NOTE — Progress Notes (Signed)
Physical Therapy Treatment Patient Details Name: Darius Taylor MRN: [id number] DOB: Dec 02, 1945 Today's Date: 01/18/2019    History of Present Illness 73 y.o. male with a known history of atrial fibrillation on eliquis, CAD, nonischemic cardiomyopathy with last known EF of 25% status post AICD and pacemaker, CKD, history of colonic adenocarcinoma status post partial colectomy, history of renal cell carcinoma status post left nephrectomy, COPD on as needed home oxygen presented to the hospital secondary to worsening ED with a complaint of weakness and fatigue.  He was found here to have an elevated creatinine he has elevated troponin and BNP.  While hospitalized he had increased respiratory distress and needed to be transferred to CCU, possible covid-19 suspected, but testing was negative.    PT Comments    Patient tolerated treatment well and continues to make progress towards goals. He continues to be most limited by O2 sat dropping precipitously. At start of session O2 sat was 91% on 4L/min but dropped to 80% as soon as he started performing functional mobility tasks. Nursing and respiratory therapy were consulted and approved increasing O2 delivery. Patient's O2 sat continued to drip below 88% during functional mobiltiy on 6 and 8 L/min O2. He performed 5 STS transfers with RW and supervision and pericare with frequent rests to allow O2 sat to recover while gradually increasing O2 delivery rate and cuing for PLB. Patient ambulated approximately 120 feet with RW and supervision and 10L/min O2 delivered on high flow nasal cannula. Pulse ox had difficulty reading while gripping RW but read in low 90s during two short rests during ambulation to allow it to register and also following ambulation. Patient continues to demonstrate good safety awareness and use of RW but requires intermittent cuing for proper PLB technique. Patient would benefit from continued physical therapy to address remaining impairments  and functional limitations to work towards stated goals and return to PLOF or maximal functional independence.    Follow Up Recommendations  Home health PT;Supervision - Intermittent     Equipment Recommendations  None recommended by PT    Recommendations for Other Services       Precautions / Restrictions Precautions Precautions: Fall;Other (comment) Precaution Comments: hypoxia Restrictions Weight Bearing Restrictions: No    Mobility  Bed Mobility Overal bed mobility: Independent             General bed mobility comments: patient seated at EOB; presumed I based on previous documentation  Transfers Overall transfer level: Needs assistance Equipment used: Rolling walker (2 wheeled) Transfers: Sit to/from Stand Sit to Stand: Supervision         General transfer comment: STS transfer x 5 trials between functional activities.   Ambulation/Gait Ambulation/Gait assistance: Supervision Gait Distance (Feet): 120 Feet Assistive device: Rolling walker (2 wheeled) Gait Pattern/deviations: Narrow base of support Gait velocity: decreased   General Gait Details: good confidence with ambulation. Ambulated 120 feet on 10 L/min high flow nasal cannula after all attempts at standing mobilitly caused O2 sat to drop below 88% on less O2 delivery. Patient fatigued with effort but was motivated to continue. Limited when started to feel fatigue. O2 sat remained near 90% with 10L/min and cuing for PLB   Stairs             Wheelchair Mobility    Modified Rankin (Stroke Patients Only)       Balance Overall balance assessment: Modified Independent         Standing balance support: Bilateral upper extremity supported;During functional activity  Standing balance-Leahy Scale: Good Standing balance comment: requires BUE support for safe dynamic balance                            Cognition Arousal/Alertness: Awake/alert Behavior During Therapy: WFL for tasks  assessed/performed Overall Cognitive Status: Within Functional Limits for tasks assessed                                        Exercises Other Exercises Other Exercises: Patient completed multiple STS transfers in room while toileting, getting dressed for ambulation, hand washing. O2 sat near 90% on 4L/min at rest but quickly dropped to 80% with functional mobility; consulted nursing and respiratory therapy who approved use of higher flows to maintain O2 sat above 88%. Continued to drop below 88% during functional mobiltiy with 6 and 8 L/min, but maintained in low 90s with 10L/min. Requierd cuing for PLB throughout session and required multiple sitting rests to bring up O2 sat. Patient completed pericare with supervision and practiced standing balance at sink while washing hands with supervision.     General Comments        Pertinent Vitals/Pain Pain Assessment: No/denies pain    Home Living                      Prior Function            PT Goals (current goals can now be found in the care plan section) Acute Rehab PT Goals Patient Stated Goal: go home... but not until he's feeling better PT Goal Formulation: With patient Time For Goal Achievement: 01/23/19 Potential to Achieve Goals: Good Progress towards PT goals: Progressing toward goals    Frequency    Min 2X/week      PT Plan Current plan remains appropriate    Co-evaluation              AM-PAC PT "6 Clicks" Mobility   Outcome Measure  Help needed turning from your back to your side while in a flat bed without using bedrails?: None Help needed moving from lying on your back to sitting on the side of a flat bed without using bedrails?: None Help needed moving to and from a bed to a chair (including a wheelchair)?: None Help needed standing up from a chair using your arms (e.g., wheelchair or bedside chair)?: None Help needed to walk in hospital room?: A Little Help needed climbing  3-5 steps with a railing? : A Little 6 Click Score: 22    End of Session Equipment Utilized During Treatment: Gait belt;Oxygen(4-10L/min approved by RT and RN) Activity Tolerance: Patient limited by fatigue;Patient tolerated treatment well(limited by O2 sat dropping) Patient left: with chair alarm set;with call bell/phone within reach;in chair Nurse Communication: Mobility status(results of session) PT Visit Diagnosis: Muscle weakness (generalized) (M62.81);Difficulty in walking, not elsewhere classified (R26.2)     Time: 1411-1450 PT Time Calculation (min) (ACUTE ONLY): 39 min  Charges:  $Gait Training: 8-22 mins $Therapeutic Activity: 23-37 mins                     Everlean Alstrom. Graylon Good, PT, DPT 01/18/19, 3:20 PM

## 2019-01-18 NOTE — Progress Notes (Signed)
[facility] NAME: Darius Taylor    MR#:  [id number]  DATE OF BIRTH:  Dec 04, 1945  SUBJECTIVE:   Patient is still on high flow 5.5 l of oxygen.  Using BiPAP during nights Stool for occult blood is positive denies any hematemesis or abdominal pain Urine stream is better after Flomax started  Reports using 2 L of oxygen as needed at home prior to this admission  REVIEW OF SYSTEMS:    Review of Systems  Constitutional: Negative for fever, chills weight loss HENT: Negative for ear pain, nosebleeds, congestion, facial swelling, rhinorrhea, neck pain, neck stiffness and ear discharge.   Respiratory: no cough, ++IMPROVED shortness of breath, no wheezing  Cardiovascular: Negative for chest pain, palpitations and + MILD leg swelling.  Gastrointestinal: Negative for heartburn, abdominal pain, vomiting, diarrhea or consitpation Genitourinary: Negative for dysuria, urgency, frequency, hematuria Musculoskeletal: Negative for back pain or joint pain Neurological: Negative for dizziness, seizures, syncope, focal weakness,  numbness and headaches.  Hematological: Does not bruise/bleed easily.  Psychiatric/Behavioral: Negative for hallucinations, confusion, dysphoric mood    Tolerating Diet: yes      DRUG ALLERGIES:  No Known Allergies  VITALS:  Blood pressure 131/76, pulse 63, temperature 98 F (36.7 C), temperature source Oral, resp. rate 20, height 5\' 11"  (1.803 m), weight 102.3 kg, SpO2 91 %.  PHYSICAL EXAMINATION:  Constitutional: Appears well-developed and well-nourished. No distress. HENT: Normocephalic. . Eyes: Conjunctivae and EOM are normal. PERRLA, no scleral icterus.  Neck: Normal ROM. Neck supple. No JVD. No tracheal deviation. CVS: RRR, S1/S2 +, 3/6  murmurs, no gallops, no carotid bruit.  Pulmonary: Normal respiratory effort with some mild rhonchi right upper lobe.   Abdominal: Soft. BS +,  no distension, tenderness,  rebound or guarding.  Musculoskeletal: Normal range of motion. 1+ LE edema and no tenderness.  Neuro: Alert. CN 2-12 grossly intact. No focal deficits. Skin: Skin is warm and dry. No rash noted. Psychiatric: Normal mood and affect.      LABORATORY PANEL:   CBC Recent Labs  Lab 01/18/19 0426  WBC 10.6*  HGB 8.1*  HCT 27.1*  PLT 255   ------------------------------------------------------------------------------------------------------------------  Chemistries  Recent Labs  Lab 01/18/19 0426  NA 142  K 3.9  CL 105  CO2 30  GLUCOSE 102*  BUN 62*  CREATININE 1.60*  CALCIUM 8.7*   ------------------------------------------------------------------------------------------------------------------  Cardiac Enzymes No results for input(s): TROPONINI in the last 168 hours.------------------------------------------------------------------------------------------------------------------  RADIOLOGY:  Dg Chest Port 1 View  Result Date: 01/18/2019 CLINICAL DATA:  Continued shortness of breath since admission. Denies chest pain. EXAM: PORTABLE CHEST 1 VIEW COMPARISON:  01/15/2019. FINDINGS: Massive cardiomegaly. Dual lead pacer/AICD, stable. BILATERAL pulmonary opacities persist, but are slightly improved. These are mildly consolidative in the RIGHT upper lobe. These opacities could represent a combination of edema and infection. Continued surveillance is warranted. IMPRESSION: Slight improvement aeration. Electronically Signed   By: Staci Righter M.D.   On: 01/18/2019 07:26     ASSESSMENT AND PLAN:   73 year old male with a history of PAF, EtOH abuse and COPD who presented to the emergency room due to generalized weakness and found to have elevated creatinine.   1.  Acute on chronic kidney disease stage III due to EtOH abuse and dehydration: Creatinine has improved  Cr at 1.6.  Continue close monitoring while patient is on high-dose Lasix  .2.  Acute on chronic hypoxic  respiratory failure: Possible underlying pulmonary hypertension given right ventricular  systolic pressure is high  patient was transferred to ICU and placed on BiPAP and then transferred out of ICU however back to stepdown unit 4/13 due to increasing shortness of breath and hypoxia. Acute hypoxic respiratory failure with worsening O2 requirements due to  acute on chronic diastolic heart failure underlying pulmonary hypertension-patient needs right heart cath once clinically stable. Healthcare associated pneumonia treated with a IV antibiotics cefepime and vancomycin and completed antibiotic course Increased Lasix dose to 80 mg twice daily as patient still has significant lower extremity edema and bilateral rales and rhonchi.  Fluid restriction to 1200 cc/day Still on high flow 5.5 L will try to wean off.  Patient uses 2 L of oxygen at home as needed  If no clinical improvement will consider LTAC patient is agreeable but prefers talking to social worker first [place] pulmonology follow-up Patient has tested negative for COVID-19.  Respiratory viral panel was also negative. MRSA PCR was negative.  IV albumin given -Flutter valve device   3.  Acute on chronic diastolic heart failure:  Continue IV Lasix 80 milligrams twice daily Monitor intake and output -1771 in the past 24 hours and 13,816 since admission  4.  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  Increasing Lasix dose to 80 mg iv twice daily.  If no improvement will consider Lasix drip .  Continue close monitoring and avoid nephrotoxins -We will consider nephrology consult if needed otherwise outpatient nephrology follow-up  5.  PAF, history of aortic stenosis status post TAVR: Patient currently in sinus rhythm Continue Eliquis.  Resume home medication amiodarone Stool for occult blood is positive could be from Eliquis use, will hold it  today  6.  COPD without signs of exacerbation: Continue nebs  7.  Hypertension continue current medications and titrate as needed  8.  Anemia of chronic disease    Status post transfusion of 1 unit of blood hemoglobin at 7.9 today Stool for occult blood is positive Hold Eliquis GI consult placed and notified Dr. Alice Reichert is aware Clear liquid diet for now  MCV 99.6   anemia labs vitamin B12 767 ferritin, folate, serum iron levels are normal  #Dysuria with history of bladder cancer- Status post cystoscopy care at [facility] urology PSA normal Flomax Discussed with urology Dr. Jeb Levering agreeable with Flomax.  Post void residual is less than 200.  Not recommending Foley catheter and recommended outpatient follow-up with [facility] urology  #Outpatient follow-up with podiatry for nail care as per my discussion with Dr. Vickki Muff  #Prolonged hospital course requiring high flow oxygen Patient is agreeable to LTAC if no clinical improvement by Monday Case management consult placed   Management plans discussed with the patient and he is in agreement.  CODE STATUS: full  TOTAL TIME TAKING CARE OF THIS PATIENT: 34 minutes.     POSSIBLE D/c ? with HHC if we can get O2 down.  If no clinical improvement LTAC is an option will discuss with the patient  Nicholes Mango M.D on 01/18/2019 at 12:59 PM  Between 7am to 6pm - Pager - [phone] After 6pm go to [email or web address] - password [email or web address] [facility]  Office  [phone]  CC: Primary care physician; [employer]  Note: This dictation was prepared with Diplomatic Services operational officer dictation along with smaller Company secretary. Any transcriptional errors that result from this process are unintentional.

## 2019-01-18 NOTE — Progress Notes (Signed)
Pulmonary Medicine          Date: 01/18/2019,   MRN# [id number] Darius Taylor 12-19-1945     AdmissionWeight: 99.8 kg                 CurrentWeight: 102.3 kg       SUBJECTIVE   Patient resting in bed, was concerned about stopping Eliquis, discussed low Hb and need for pRBC transfusion.  Discussed care plan for acute on chronic hypoxemic respiratory failure from pulm perspective.    PAST MEDICAL HISTORY   Past Medical History:  Diagnosis Date   Aortic stenosis    Arrhythmia    atrial fibrillation   Asthma    Atrial fibrillation (HCC)    CAD (coronary artery disease)    Cancer (HCC)    colon, bladder and kidney   CHF (congestive heart failure) (HCC)    Colon adenocarcinoma (HCC)    COPD (chronic obstructive pulmonary disease) ([place])    H/O ventricular tachycardia    Hypertension    Nonischemic cardiomyopathy ([place])    Status post AICD and pacemaker   Renal cell carcinoma ([place])      SURGICAL HISTORY   Past Surgical History:  Procedure Laterality Date   CARDIAC DEFIBRILLATOR PLACEMENT     CARDIAC DEFIBRILLATOR PLACEMENT     COLONOSCOPY WITH PROPOFOL N/A 06/10/2018   Procedure: COLONOSCOPY WITH PROPOFOL;  Surgeon: Lin Landsman, MD;  Location: [facility] ENDOSCOPY;  Service: Gastroenterology;  Laterality: N/A;   CORONARY ANGIOPLASTY  09/25/2016   IRRIGATION AND DEBRIDEMENT HEMATOMA Left 10/24/2016   Procedure: IRRIGATION AND DEBRIDEMENT HEMATOMA;  Surgeon: Florene Glen, MD;  Location: [facility] ORS;  Service: General;  Laterality: Left;   KIDNEY SURGERY     left ne[hrectomy for RCC   PACEMAKER IMPLANT     PARTIAL COLECTOMY       FAMILY HISTORY   Family History  Problem Relation Age of Onset   Hypertension Mother    Stroke Father      SOCIAL HISTORY   Social History   Tobacco Use   Smoking status: Former Smoker   Smokeless tobacco: Never Used  Substance Use Topics   Alcohol use: No   Drug use: No      MEDICATIONS    Home Medication:    Current Medication:  Current Facility-Administered Medications:    0.9 %  sodium chloride infusion (Manually program via Guardrails IV Fluids), , Intravenous, Once, Gouru, Aruna, MD   0.9 %  sodium chloride infusion, , Intravenous, PRN, Gouru, Aruna, MD, Stopped at 01/11/19 0047   acetaminophen (TYLENOL) tablet 650 mg, 650 mg, Oral, Q6H PRN, 650 mg at 01/17/19 0552 **OR** acetaminophen (TYLENOL) suppository 650 mg, 650 mg, Rectal, Q6H PRN, Lance Coon, MD   albuterol (PROVENTIL) (2.5 MG/3ML) 0.083% nebulizer solution 2.5 mg, 2.5 mg, Nebulization, Q4H PRN, Gouru, Aruna, MD, 2.5 mg at 01/14/19 0502   amiodarone (PACERONE) tablet 200 mg, 200 mg, Oral, Daily, Gouru, Aruna, MD, 200 mg at 01/18/19 0909   amLODipine (NORVASC) tablet 10 mg, 10 mg, Oral, Daily, Lance Coon, MD, 10 mg at 01/18/19 0809   apixaban (ELIQUIS) tablet 5 mg, 5 mg, Oral, BID, Cyndee Brightly M, RPH, 5 mg at 01/18/19 1132   aspirin chewable tablet 81 mg, 81 mg, Oral, Daily, Lance Coon, MD, 81 mg at 01/18/19 0809   docusate sodium (COLACE) capsule 100 mg, 100 mg, Oral, Daily, Fritzi Mandes, MD, 100 mg at 01/18/19 0809   feeding supplement (NEPRO CARB  STEADY) liquid 237 mL, 237 mL, Oral, Q24H, Gouru, Aruna, MD, 237 mL at 18/56/31 4970   folic acid (FOLVITE) tablet 1 mg, 1 mg, Oral, Daily, Lance Coon, MD, 1 mg at 01/18/19 0809   furosemide (LASIX) injection 80 mg, 80 mg, Intravenous, BID, Gouru, Aruna, MD, 80 mg at 01/18/19 0808   guaiFENesin-dextromethorphan (ROBITUSSIN DM) 100-10 MG/5ML syrup 5 mL, 5 mL, Oral, Q4H PRN, Gouru, Aruna, MD, 5 mL at 01/02/19 0618   insulin aspart (novoLOG) injection 0-15 Units, 0-15 Units, Subcutaneous, TID WC, Wilhelmina Mcardle, MD, 3 Units at 01/17/19 1139   insulin aspart (novoLOG) injection 0-5 Units, 0-5 Units, Subcutaneous, QHS, Wilhelmina Mcardle, MD, 2 Units at 01/11/19 2133   ipratropium-albuterol (DUONEB) 0.5-2.5 (3) MG/3ML  nebulizer solution 3 mL, 3 mL, Nebulization, Q6H, Gouru, Aruna, MD, 3 mL at 01/18/19 0913   magnesium oxide (MAG-OX) tablet 400 mg, 400 mg, Oral, BID, Lance Coon, MD, 400 mg at 01/18/19 2637   MEDLINE mouth rinse, 15 mL, Mouth Rinse, BID, Wilhelmina Mcardle, MD, 15 mL at 01/16/19 1334   metoprolol succinate (TOPROL-XL) 24 hr tablet 100 mg, 100 mg, Oral, Daily, Lanney Gins, Bryan Goin, MD, 100 mg at 01/18/19 0909   multivitamin with minerals tablet 1 tablet, 1 tablet, Oral, Daily, Lance Coon, MD, 1 tablet at 01/18/19 0809   [DISCONTINUED] ondansetron (ZOFRAN) tablet 4 mg, 4 mg, Oral, Q6H PRN, 4 mg at 01/03/19 1407 **OR** ondansetron (ZOFRAN) injection 4 mg, 4 mg, Intravenous, Q6H PRN, Lance Coon, MD, 4 mg at 01/12/19 0421   potassium chloride 20 MEQ/15ML (10%) solution 20 mEq, 20 mEq, Oral, Daily, Mody, Sital, MD, 20 mEq at 01/18/19 0909   sodium chloride flush (NS) 0.9 % injection 10 mL, 10 mL, Intravenous, Q12H, Gouru, Aruna, MD, 10 mL at 01/18/19 0910   tamsulosin (FLOMAX) capsule 0.4 mg, 0.4 mg, Oral, Daily, Gouru, Aruna, MD, 0.4 mg at 01/18/19 0909   thiamine (VITAMIN B-1) tablet 100 mg, 100 mg, Oral, Daily, 100 mg at 01/18/19 0809 **OR** [DISCONTINUED] thiamine (B-1) injection 100 mg, 100 mg, Intravenous, Daily, Lance Coon, MD, 100 mg at 01/04/19 0908    ALLERGIES   Patient has no known allergies.     REVIEW OF SYSTEMS    Review of Systems:  Gen:  Denies  fever, sweats, chills weigh loss  HEENT: Denies blurred vision, double vision, ear pain, eye pain, hearing loss, nose bleeds, sore throat Cardiac:  No dizziness, chest pain or heaviness, chest tightness,edema Resp:   Denies cough or sputum porduction, shortness of breath,wheezing, hemoptysis,  Gi: Denies swallowing difficulty, stomach pain, nausea or vomiting, diarrhea, constipation, bowel incontinence Gu:  Denies bladder incontinence, burning urine Ext:   Denies Joint pain, stiffness or swelling Skin: Denies  skin  rash, easy bruising or bleeding or hives Endoc:  Denies polyuria, polydipsia , polyphagia or weight change Psych:   Denies depression, insomnia or hallucinations   Other:  All other systems negative   VS: BP 131/76 (BP Location: Left Arm)    Pulse 63    Temp 98 F (36.7 C) (Oral)    Resp 20    Ht 5\' 11"  (1.803 m)    Wt 102.3 kg    SpO2 91%    BMI 31.46 kg/m      PHYSICAL EXAM    GENERAL:NAD, no fevers, chills, no weakness no fatigue HEAD: Normocephalic, atraumatic.  EYES: Pupils equal, round, reactive to light. Extraocular muscles intact. No scleral icterus.  MOUTH: Moist mucosal membrane. Dentition intact. No  abscess noted.  EAR, NOSE, THROAT: Clear without exudates. No external lesions.  NECK: Supple. No thyromegaly. No nodules. No JVD.  PULMONARY: Bilateral crackles worse at posterior bases CARDIOVASCULAR: S1 and S2. Regular rate and rhythm. No murmurs, rubs, or gallops. No edema. Pedal pulses 2+ bilaterally.  GASTROINTESTINAL: Soft, nontender, nondistended. No masses. Positive bowel sounds. No hepatosplenomegaly.  MUSCULOSKELETAL: No swelling, clubbing, or edema. Range of motion full in all extremities.  NEUROLOGIC: Cranial nerves II through XII are intact. No gross focal neurological deficits. Sensation intact. Reflexes intact.  SKIN: No ulceration, lesions, rashes, or cyanosis. Skin warm and dry. Turgor intact.  PSYCHIATRIC: Mood, affect within normal limits. The patient is awake, alert and oriented x 3. Insight, judgment intact.       IMAGING    Dg Chest 1 View  Result Date: 01/10/2019 CLINICAL DATA:  Pneumonia. Hx of asthma, A-fib, CAD CHF, COPD, HTN. EXAM: CHEST  1 VIEW COMPARISON:  01/09/19 FINDINGS: LEFT-sided pacemaker overlies stable enlarged cardiac silhouette. The diffuse lobar airspace disease in the RIGHT upper lobe. Mild airspace disease in the RIGHT lower lobe. No focal consolidation. No pneumothorax. Chronic elevation LEFT hemidiaphragm. IMPRESSION: 1. No  significant interval change. 2. RIGHT lung asymmetric airspace disease representing asymmetric edema versus asymmetric pulmonary infection. Electronically Signed   By: Suzy Bouchard M.D.   On: 01/10/2019 07:50   Dg Chest 1 View  Result Date: 12/31/2018 CLINICAL DATA:  SOB. EXAM: CHEST  1 VIEW COMPARISON:  12/30/2018. FINDINGS: Marked cardiac enlargement. Dual lead pacer/AICD. Mild vascular congestion. No overt edema. Chronic elevation LEFT hemidiaphragm. No pneumothorax. Aortic atherosclerosis. IMPRESSION: Cardiomegaly. Mild vascular congestion without overt edema. Stable appearance from priors. Electronically Signed   By: Staci Righter M.D.   On: 12/31/2018 08:20   Dg Abd 1 View  Result Date: 01/05/2019 CLINICAL DATA:  Nausea and vomiting EXAM: ABDOMEN - 1 VIEW COMPARISON:  09/28/2016 FINDINGS: There is a large amount of stool in the ascending and transverse colon. The bowel gas pattern is normal. No radio-opaque calculi or other significant radiographic abnormality are seen. Mild osteoarthritis of the right hip. IMPRESSION: Large amount of stool in the ascending and transverse colon. Electronically Signed   By: Kathreen Devoid   On: 01/05/2019 19:42   Ct Chest Wo Contrast  Result Date: 01/02/2019 CLINICAL DATA:  Dyspnea.  History of colon cancer. EXAM: CT CHEST WITHOUT CONTRAST TECHNIQUE: Multidetector CT imaging of the chest was performed following the standard protocol without IV contrast. COMPARISON:  Radiographs of December 31, 2018. CT scan of November 10, 2018. FINDINGS: Cardiovascular: Atherosclerosis of thoracic aorta is noted without aneurysm formation. Mild cardiomegaly is noted. Status post transcatheter aortic valve replacement. Coronary artery calcifications are noted. Mediastinum/Nodes: No enlarged mediastinal or axillary lymph nodes. Thyroid gland, trachea, and esophagus demonstrate no significant findings. Lungs/Pleura: No pneumothorax or pleural effusion is noted. Moderate left lower lobe  subsegmental atelectasis or pneumonia is noted which is increased compared to prior exam. Mild right posterior basilar subsegmental atelectasis or inflammation is noted posteriorly in right lower lobe. New ground-glass opacity seen posteriorly in the right upper lobe concerning for pneumonia. Upper Abdomen: No acute abnormality. Musculoskeletal: No chest wall mass or suspicious bone lesions identified. IMPRESSION: New ground-glass opacity seen posteriorly in right upper lobe concerning for pneumonia; this may represent typical or atypical infection, including viral etiology. Also noted are increased bilateral lower lobe opacities concerning for worsening atelectasis or pneumonia, left worse than right. Coronary artery calcifications are noted. Aortic Atherosclerosis (ICD10-I70.0). Electronically  Signed   By: Marijo Conception, M.D.   On: 01/02/2019 16:18   Dg Chest Port 1 View  Result Date: 01/18/2019 CLINICAL DATA:  Continued shortness of breath since admission. Denies chest pain. EXAM: PORTABLE CHEST 1 VIEW COMPARISON:  01/15/2019. FINDINGS: Massive cardiomegaly. Dual lead pacer/AICD, stable. BILATERAL pulmonary opacities persist, but are slightly improved. These are mildly consolidative in the RIGHT upper lobe. These opacities could represent a combination of edema and infection. Continued surveillance is warranted. IMPRESSION: Slight improvement aeration. Electronically Signed   By: Staci Righter M.D.   On: 01/18/2019 07:26   Dg Chest Port 1 View  Result Date: 01/15/2019 CLINICAL DATA:  73 year old male with shortness of breath. Negative for COVID-19 on 01/03/2019. EXAM: PORTABLE CHEST 1 VIEW COMPARISON:  01/10/2019 and earlier. FINDINGS: Portable AP upright view at 0342 hours. Stable cardiomegaly and mediastinal contours. Prior TAVR. Calcified aortic atherosclerosis. Stable left chest AICD. Coarse bilateral pulmonary interstitial opacity is confluent in the right upper lobe and at the left lung base.  Ventilation has worsened since 01/08/2019, and the right upper lobe opacity appears mildly progressed since yesterday. No superimposed pneumothorax. Small left pleural effusion is difficult to exclude. IMPRESSION: 1. Coarse bilateral pulmonary opacity with mild progression in the right upper lobe since yesterday. 2. Stable cardiomegaly. Aortic Atherosclerosis (ICD10-I70.0). Electronically Signed   By: Genevie Ann M.D.   On: 01/15/2019 06:00   Dg Chest Port 1 View  Result Date: 01/09/2019 CLINICAL DATA:  Acute respiratory failure. EXAM: PORTABLE CHEST 1 VIEW COMPARISON:  Radiographs 01/08/2019 and 01/06/2019.  CT 01/02/2019. FINDINGS: 0937 hours. The left subclavian pacemaker leads appear stable. There is stable cardiomegaly and aortic atherosclerosis post TAVR. They were stable asymmetric airspace opacities in the right upper lobe and left lung base. No pneumothorax or significant pleural effusion. The bones appear unchanged. IMPRESSION: No significant change in asymmetric bilateral airspace opacities, presumably infectious/inflammatory. Stable cardiomegaly. Electronically Signed   By: Richardean Sale M.D.   On: 01/09/2019 10:24   Dg Chest Port 1 View  Result Date: 01/08/2019 CLINICAL DATA:  Respiratory failure. EXAM: PORTABLE CHEST 1 VIEW COMPARISON:  One-view chest x-ray 01/06/2019 FINDINGS: AND: FINDINGS: AND Heart is enlarged. Aortic valve replacement is noted. Atherosclerotic changes are present in the aorta. Pacing and defibrillator wires are stable. There is improving aeration of both lungs. Right upper lobe and left lower lobe airspace consolidation remain. Mild pulmonary vascular congestion is noted. IMPRESSION: 1. Slight improved aeration of both lungs with persistent right upper lobe and left lower lobe pneumonia. 2. Cardiomegaly with mild respiratory changes. Electronically Signed   By: San Morelle M.D.   On: 01/08/2019 06:40   Dg Chest Port 1 View  Result Date: 01/06/2019 CLINICAL DATA:   Respiratory failure. EXAM: PORTABLE CHEST 1 VIEW COMPARISON:  Chest x-ray from yesterday. FINDINGS: Unchanged left chest wall AICD. Stable cardiomegaly status post TAVR. Atherosclerotic calcification of the aortic arch. Unchanged pulmonary vascular congestion and interstitial thickening. Increased patchy opacity in the right upper lobe. Unchanged left greater than right bibasilar atelectasis. No large pleural effusion. No pneumothorax. No acute osseous abnormality. IMPRESSION: 1. Increasing patchy opacity in the right upper lobe, concerning for pneumonia. 2. Unchanged mild pulmonary interstitial edema and bibasilar atelectasis. Electronically Signed   By: Titus Dubin M.D.   On: 01/06/2019 11:14   Dg Chest Port 1 View  Result Date: 01/05/2019 CLINICAL DATA:  Respiratory failure. EXAM: PORTABLE CHEST 1 VIEW COMPARISON:  CT of the chest on 01/02/2019 and chest x-ray  on 12/31/2018 FINDINGS: Stable cardiac enlargement, appearance of pacing/ICD device and appearance transcatheter aortic valve. Lungs show subtle opacities in the right upper lobe and both lung bases potentially representing pneumonia. There also may be a component of mild pulmonary interstitial edema and bibasilar atelectasis. No significant pleural effusion. No pneumothorax. IMPRESSION: Subtle opacities in the right upper lobe and both lung bases potentially representing multifocal pneumonia. There also may be a component of mild pulmonary interstitial edema and bibasilar atelectasis. Electronically Signed   By: Aletta Edouard M.D.   On: 01/05/2019 08:37   Dg Chest Port 1 View  Result Date: 12/30/2018 CLINICAL DATA:  Progressive shortness of breath. EXAM: PORTABLE CHEST 1 VIEW COMPARISON:  Radiograph yesterday. CT 11/10/2018 FINDINGS: Similar cardiomegaly. Unchanged mediastinal contours. Aortic atherosclerosis with aortic root replacement. Left-sided pacemaker remains in place. Vascular congestion without overt pulmonary edema. Mild elevation  of left hemidiaphragm. Improved atelectasis in the left mid lung. No large pleural effusion. No pneumothorax. Unchanged osseous structures. IMPRESSION: Cardiomegaly with vascular congestion. No overt pulmonary edema. Improved left midlung atelectasis. Aortic Atherosclerosis (ICD10-I70.0). Electronically Signed   By: Keith Rake M.D.   On: 12/30/2018 02:01   Dg Chest Portable 1 View  Result Date: 12/29/2018 CLINICAL DATA:  Weakness with apparent hypotension EXAM: PORTABLE CHEST 1 VIEW COMPARISON:  Chest radiograph November 05, 2018 and chest CT November 10, 2018 FINDINGS: There is atelectatic change in the left mid lung. There is no appreciable edema or consolidation. Heart is mildly enlarged with pulmonary vascularity normal. Pacemaker leads are attached the right atrium and right ventricle. There is aortic atherosclerosis. No adenopathy. No bone lesions. There is calcification in each carotid artery. IMPRESSION: Left midlung atelectasis. No edema or consolidation. Mild cardiomegaly. Pacemaker leads attached to right atrium and right ventricle. Aortic Atherosclerosis (ICD10-I70.0). Bilateral carotid artery calcification noted. Electronically Signed   By: Lowella Grip III M.D.   On: 12/29/2018 21:14   [place] Ekg Site Rite  Result Date: 01/14/2019 If Site Rite image not attached, placement could not be confirmed due to current cardiac rhythm.     ASSESSMENT/PLAN    Acute on chronic hypoxemic respiratory failure -acute decompensated diastolic CHF exacerbation -  -Continue diuresis - now total >12.9L negative -Continue strict I's and O's with 1200 cc fluid restriction -Discussed possible LTAC transfer if diuresis is slow due to increased length of stay at this point   -Bibasilar atelectasis-have reviewed with patient appropriate incentive spirometry and Acapella device usage, encourage use each hour multiple times-discussed with Bambi RT appreciate  input.  -Please perform exertional walk to delineate amount of oxygen needed to keep SPO2 over 88%-in preparation to discharge home onincreasedoxygen   - will need outpatient workup for pulmonary hypertension via RHC - RVSP was significantly elevated on TTE will discuss with cardiology - appreciate input  -appreciate physical therapy input     COPD -continue nebulizer therapy - IS/ Chest PT - no need for steroidsat this time   Thank you for allowing me to participate in the care of this patient.   Patient/Family are satisfied with care plan and all questions have been answered.  This document was prepared using Dragon voice recognition software and may include unintentional dictation errors.     Ottie Glazier, M.D.  Division of [place]

## 2019-01-18 NOTE — Plan of Care (Signed)
  Problem: Clinical Measurements: Goal: Ability to maintain clinical measurements within normal limits will improve Outcome: Not Progressing Note:  BUN level is elevated today at 62. Will continue to monitor renal function. [name] [facility]

## 2019-01-18 NOTE — Consult Note (Addendum)
ANTICOAGULATION CONSULT NOTE - Initial Consult  Pharmacy Consult for Apixaban Indication: atrial fibrillation  No Known Allergies  Patient Measurements: Height: 5\' 11"  (180.3 cm) Weight: 225 lb 8.5 oz (102.3 kg) IBW/kg (Calculated) : 75.3   Vital Signs: Temp: 98 F (36.7 C) (04/26 0805) Temp Source: Oral (04/26 0805) BP: 131/76 (04/26 0805) Pulse Rate: 63 (04/26 0805)  Labs: Recent Labs    01/16/19 0308 01/17/19 0623 01/18/19 0426  HGB 7.1* 7.8* 8.1*  HCT 24.1* 25.7* 27.1*  PLT 290 259 255  CREATININE  --  1.56* 1.60*    Estimated Creatinine Clearance: 49.3 mL/min (A) (by C-G formula based on SCr of 1.6 mg/dL (H)).   Medical History: Past Medical History:  Diagnosis Date  . Aortic stenosis   . Arrhythmia    atrial fibrillation  . Asthma   . Atrial fibrillation ([place])   . CAD (coronary artery disease)   . Cancer [facility])    colon, bladder and kidney  . CHF (congestive heart failure) ([place])   . Colon adenocarcinoma ([place])   . COPD (chronic obstructive pulmonary disease) ([place])   . H/O ventricular tachycardia   . Hypertension   . Nonischemic cardiomyopathy ([place])    Status post AICD and pacemaker  . Renal cell carcinoma (HCC)     Medications:  Patient was on Apixaban 5 mg BID outpatient for nonvalvular atrial fibrillation.  Assessment: Patient was seen by GI on 4/25 for positive stool hemoccult. No acute GI bleeding was seen. Hemoglobin is > 8 today and platelets are > 250. Last dose of apixaban was 4/25 AM. It was then held for GI assessment of bleeding. Spoke with patient's nurse who confirmed he had no S/S of bleeding.   Goal of Therapy:  Monitor platelets by anticoagulation protocol: Yes   Plan:  Will resume home apixaban of 5 mg BID. Pharmacy will continue to monitor.    Paticia Stack, PharmD Pharmacy Resident  01/18/2019 8:58 AM

## 2019-01-19 LAB — CBC
HCT: 26 % — ABNORMAL LOW (ref 39.0–52.0)
Hemoglobin: 7.9 g/dL — ABNORMAL LOW (ref 13.0–17.0)
MCH: 29.8 pg (ref 26.0–34.0)
MCHC: 30.4 g/dL (ref 30.0–36.0)
MCV: 98.1 fL (ref 80.0–100.0)
Platelets: 250 10*3/uL (ref 150–400)
RBC: 2.65 MIL/uL — ABNORMAL LOW (ref 4.22–5.81)
RDW: 17.9 % — ABNORMAL HIGH (ref 11.5–15.5)
WBC: 9.7 10*3/uL (ref 4.0–10.5)
nRBC: 0 % (ref 0.0–0.2)

## 2019-01-19 LAB — BASIC METABOLIC PANEL
Anion gap: 7 (ref 5–15)
BUN: 55 mg/dL — ABNORMAL HIGH (ref 8–23)
CO2: 31 mmol/L (ref 22–32)
Calcium: 8.8 mg/dL — ABNORMAL LOW (ref 8.9–10.3)
Chloride: 103 mmol/L (ref 98–111)
Creatinine, Ser: 1.68 mg/dL — ABNORMAL HIGH (ref 0.61–1.24)
GFR calc Af Amer: 46 mL/min — ABNORMAL LOW (ref >60)
GFR calc non Af Amer: 39 mL/min — ABNORMAL LOW (ref >60)
Glucose, Bld: 114 mg/dL — ABNORMAL HIGH (ref 70–99)
Potassium: 3.8 mmol/L (ref 3.5–5.1)
Sodium: 141 mmol/L (ref 135–145)

## 2019-01-19 LAB — GLUCOSE, CAPILLARY
Glucose-Capillary: 92 mg/dL (ref 70–99)
Glucose-Capillary: 125 mg/dL — ABNORMAL HIGH (ref 70–99)
Glucose-Capillary: 145 mg/dL — ABNORMAL HIGH (ref 70–99)
Glucose-Capillary: 110 mg/dL — ABNORMAL HIGH (ref 70–99)

## 2019-01-19 MED ORDER — LATANOPROST 0.005 % OP SOLN
1.0000 [drp] | Freq: Every day | OPHTHALMIC | Status: DC
  Administered 2019-01-19 – 2019-01-20 (×2): 1 [drp] via OPHTHALMIC
  Filled 2019-01-19: qty 2.5

## 2019-01-19 MED ORDER — BRIMONIDINE TARTRATE 0.15 % OP SOLN
1.0000 [drp] | Freq: Three times a day (TID) | OPHTHALMIC | Status: DC
  Administered 2019-01-19 – 2019-01-21 (×6): 1 [drp] via OPHTHALMIC
  Filled 2019-01-19: qty 5

## 2019-01-19 NOTE — Progress Notes (Signed)
[facility] [place] NAME: Darius Taylor    MR#:  [id number]  DATE OF BIRTH:  1946/07/30  SUBJECTIVE:   Patient is on 4 liter [place] oxygen now Stool for occult blood is positive denies any hematemesis or abdominal pain Urine stream is better after Flomax started  Reports using 2 L of oxygen as needed at home prior to this admission  REVIEW OF SYSTEMS:    Review of Systems  Constitutional: Negative for fever, chills weight loss HENT: Negative for ear pain, nosebleeds, congestion, facial swelling, rhinorrhea, neck pain, neck stiffness and ear discharge.   Respiratory: no cough, ++IMPROVED shortness of breath, no wheezing  Cardiovascular: Negative for chest pain, palpitations and + MILD leg swelling.  Gastrointestinal: Negative for heartburn, abdominal pain, vomiting, diarrhea or consitpation Genitourinary: Negative for dysuria, urgency, frequency, hematuria Musculoskeletal: Negative for back pain or joint pain Neurological: Negative for dizziness, seizures, syncope, focal weakness,  numbness and headaches.  Hematological: Does not bruise/bleed easily.  Psychiatric/Behavioral: Negative for hallucinations, confusion, dysphoric mood  Tolerating Diet: yes   DRUG ALLERGIES:  No Known Allergies  VITALS:  Blood pressure 124/73, pulse 66, temperature 98.4 F (36.9 C), temperature source Oral, resp. rate 18, height 5\' 11"  (1.803 m), weight 96.5 kg, SpO2 91 %.  PHYSICAL EXAMINATION:  Constitutional: Appears well-developed and well-nourished. No distress. HENT: Normocephalic. . Eyes: Conjunctivae and EOM are normal. PERRLA, no scleral icterus.  Neck: Normal ROM. Neck supple. No JVD. No tracheal deviation. CVS: RRR, S1/S2 +, 3/6  murmurs, no gallops, no carotid bruit.  Pulmonary: Normal respiratory effort with some mild rhonchi right upper lobe.   Abdominal: Soft. BS +,  no distension, tenderness, rebound or guarding.  Musculoskeletal: Normal range  of motion. 1+ LE edema and no tenderness.  Neuro: Alert. CN 2-12 grossly intact. No focal deficits. Skin: Skin is warm and dry. No rash noted. Psychiatric: Normal mood and affect.   LABORATORY PANEL:   CBC Recent Labs  Lab 01/19/19 0431  WBC 9.7  HGB 7.9*  HCT 26.0*  PLT 250   ------------------------------------------------------------------------------------------------------------------  Chemistries  Recent Labs  Lab 01/19/19 0431  NA 141  K 3.8  CL 103  CO2 31  GLUCOSE 114*  BUN 55*  CREATININE 1.68*  CALCIUM 8.8*   ------------------------------------------------------------------------------------------------------------------  Cardiac Enzymes No results for input(s): TROPONINI in the last 168 hours.------------------------------------------------------------------------------------------------------------------  RADIOLOGY:  Dg Chest Port 1 View  Result Date: 01/18/2019 CLINICAL DATA:  Continued shortness of breath since admission. Denies chest pain. EXAM: PORTABLE CHEST 1 VIEW COMPARISON:  01/15/2019. FINDINGS: Massive cardiomegaly. Dual lead pacer/AICD, stable. BILATERAL pulmonary opacities persist, but are slightly improved. These are mildly consolidative in the RIGHT upper lobe. These opacities could represent a combination of edema and infection. Continued surveillance is warranted. IMPRESSION: Slight improvement aeration. Electronically Signed   By: Staci Righter M.D.   On: 01/18/2019 07:26     ASSESSMENT AND PLAN:   73 year old male with a history of PAF, EtOH abuse and COPD who presented to the emergency room due to generalized weakness and found to have elevated creatinine.   1..  Acute on chronic hypoxic respiratory failure: Possible underlying pulmonary hypertension given right ventricular systolic pressure is high and acute on chronic diastolic HF -Acute hypoxic respiratory failure with worsening O2 requirements due to  acute on chronic diastolic  heart failure underlying pulmonary hypertension-patient needs right heart cath once clinically stable. -Healthcare associated pneumonia treated with a IV antibiotics cefepime and vancomycin  and completed antibiotic course -Increased Lasix dose to 80 mg twice daily as patient still has significant lower extremity edema and bilateral rales and rhonchi.  Fluid restriction to 1200 cc/day -now on 4 liter [place] will try to wean off.  Patient uses 2 L of oxygen at home as needed  -If no clinical improvement will consider LTAC patient is agreeable but prefers talking to social worker first--pt adamant and wants to go home -Appreciate pulmonology follow-up by Dr Lanney Gins -Patient has tested negative for COVID-19.  Respiratory viral panel was also negative. -MRSA PCR was negative.  I-Flutter valve device  2  Acute on chronic diastolic heart failure:  Continue IV Lasix 80 milligrams twice daily Monitor intake and output -1771 in the past 24 hours and 13,816 since admission  3. 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  Increasing Lasix dose to 80 mg iv twice daily.  If no improvement will consider Lasix drip -Continue close monitoring and avoid nephrotoxins  4.PAF, history of aortic stenosis status post TAVR: Patient currently in sinus rhythm Continue Eliquis.  Resume home medication amiodarone Stool for occult blood is positive could be from Eliquis use  5.COPD without signs of exacerbation: Continue nebs  6.Hypertension continue current medications and titrate as needed  7. Anemia of chronic disease    -Status post transfusion of 1 unit of blood hemoglobin at 7.9 today -Stool for occult blood is positive-- patient is at a high risk given his poor lung and heart condition. Patient does not want any procedure done. His eliquis has been resumed. -GI consult placed and notified Dr. Alice Reichert is aware -Clear  liquid diet for now - MCV 99.6 -  anemia labs vitamin B12 767 ferritin, folate, serum iron levels are normal   8Dysuria with history of bladder cancer- Status post cystoscopy care at [facility] urology PSA normal Flomax Discussed with urology Dr. Jeb Levering agreeable with Flomax.  Post void residual is less than 200.  Not recommending Foley catheter and recommended outpatient follow-up with [facility] urology  9.Outpatient follow-up with podiatry for nail care as per my discussion with Dr. Vickki Muff  Discussed patient regarding discharge planning. He is adamant and wants to go home. He is down to 4 L nasal cannula. Will continue to wean if possible else will try to discharge him an ideal to to home with home health. Patient is agreeable.  Management plans discussed with the patient and he is in agreement.  CODE STATUS: full  TOTAL TIME TAKING CARE OF THIS PATIENT: 34 minutes.    Fritzi Mandes M.D on 01/19/2019 at 2:51 PM  Between 7am to 6pm - Pager - [phone] After 6pm go to [email or web address] - password [email or web address] [facility]  Office  [phone]  CC: Primary care physician; [employer]  Note: This dictation was prepared with Diplomatic Services operational officer dictation along with smaller Company secretary. Any transcriptional errors that result from this process are unintentional.

## 2019-01-19 NOTE — Plan of Care (Signed)
  Problem: Health Behavior/Discharge Planning: Goal: Ability to manage health-related needs will improve Outcome: Progressing   Problem: Clinical Measurements: Goal: Ability to maintain clinical measurements within normal limits will improve Outcome: Progressing Goal: Will remain free from infection Outcome: Progressing Goal: Diagnostic test results will improve Outcome: Progressing Goal: Respiratory complications will improve Outcome: Progressing Goal: Cardiovascular complication will be avoided Outcome: Progressing   Problem: Activity: Goal: Risk for activity intolerance will decrease Outcome: Progressing   Problem: Nutrition: Goal: Adequate nutrition will be maintained Outcome: Progressing   Problem: Coping: Goal: Level of anxiety will decrease Outcome: Progressing   Problem: Elimination: Goal: Will not experience complications related to bowel motility Outcome: Progressing Goal: Will not experience complications related to urinary retention Outcome: Progressing   Problem: Pain Managment: Goal: General experience of comfort will improve Outcome: Progressing   Problem: Safety: Goal: Ability to remain free from injury will improve Outcome: Progressing   Problem: Skin Integrity: Goal: Risk for impaired skin integrity will decrease Outcome: Progressing   Problem: Activity: Goal: Ability to tolerate increased activity will improve Outcome: Progressing   Problem: Clinical Measurements: Goal: Ability to maintain a body temperature in the normal range will improve Outcome: Progressing   Problem: Respiratory: Goal: Ability to maintain adequate ventilation will improve Outcome: Progressing Goal: Ability to maintain a clear airway will improve Outcome: Progressing   Problem: Education: Goal: Ability to demonstrate management of disease process will improve Outcome: Progressing Goal: Ability to verbalize understanding of medication therapies will  improve Outcome: Progressing   Problem: Activity: Goal: Capacity to carry out activities will improve Outcome: Progressing   Problem: Cardiac: Goal: Ability to achieve and maintain adequate cardiopulmonary perfusion will improve Outcome: Progressing

## 2019-01-19 NOTE — Care Management Important Message (Signed)
Important Message  Patient Details  Name: KENG JEWEL MRN: [id number] Date of Birth: 02-14-1946   Medicare Important Message Given:  Yes    Dannette Barbara 01/19/2019, 11:20 AM

## 2019-01-19 NOTE — Plan of Care (Signed)
  Problem: Safety: Goal: Ability to remain free from injury will improve Outcome: Progressing   Problem: Activity: Goal: Ability to tolerate increased activity will improve Outcome: Progressing   Problem: Education: Goal: Ability to demonstrate management of disease process will improve Outcome: Progressing   Problem: Cardiac: Goal: Ability to achieve and maintain adequate cardiopulmonary perfusion will improve Outcome: Progressing

## 2019-01-19 NOTE — Progress Notes (Signed)
Pulmonary Medicine          Date: 01/19/2019,   MRN# [id number] Darius Taylor March 03, 1946     AdmissionWeight: 99.8 kg                 CurrentWeight: 96.5 kg       SUBJECTIVE   Patient relatively unchanged from yesterday.  He is down to 4L/min O2.  He declines LTAC , wishes to go home from hospital.    PAST MEDICAL HISTORY   Past Medical History:  Diagnosis Date   Aortic stenosis    Arrhythmia    atrial fibrillation   Asthma    Atrial fibrillation (HCC)    CAD (coronary artery disease)    Cancer (HCC)    colon, bladder and kidney   CHF (congestive heart failure) (HCC)    Colon adenocarcinoma (HCC)    COPD (chronic obstructive pulmonary disease) ([place])    H/O ventricular tachycardia    Hypertension    Nonischemic cardiomyopathy ([place])    Status post AICD and pacemaker   Renal cell carcinoma ([place])      SURGICAL HISTORY   Past Surgical History:  Procedure Laterality Date   CARDIAC DEFIBRILLATOR PLACEMENT     CARDIAC DEFIBRILLATOR PLACEMENT     COLONOSCOPY WITH PROPOFOL N/A 06/10/2018   Procedure: COLONOSCOPY WITH PROPOFOL;  Surgeon: Lin Landsman, MD;  Location: [facility];  Service: Gastroenterology;  Laterality: N/A;   CORONARY ANGIOPLASTY  09/25/2016   IRRIGATION AND DEBRIDEMENT HEMATOMA Left 10/24/2016   Procedure: IRRIGATION AND DEBRIDEMENT HEMATOMA;  Surgeon: Florene Glen, MD;  Location: [facility] ORS;  Service: General;  Laterality: Left;   KIDNEY SURGERY     left ne[hrectomy for RCC   PACEMAKER IMPLANT     PARTIAL COLECTOMY       FAMILY HISTORY   Family History  Problem Relation Age of Onset   Hypertension Mother    Stroke Father      SOCIAL HISTORY   Social History   Tobacco Use   Smoking status: Former Smoker   Smokeless tobacco: Never Used  Substance Use Topics   Alcohol use: No   Drug use: No     MEDICATIONS    Home Medication:    Current Medication:  Current  Facility-Administered Medications:    0.9 %  sodium chloride infusion (Manually program via Guardrails IV Fluids), , Intravenous, Once, Gouru, Aruna, MD   0.9 %  sodium chloride infusion, , Intravenous, PRN, Gouru, Aruna, MD, Stopped at 01/11/19 0047   acetaminophen (TYLENOL) tablet 650 mg, 650 mg, Oral, Q6H PRN, 650 mg at 01/17/19 0552 **OR** acetaminophen (TYLENOL) suppository 650 mg, 650 mg, Rectal, Q6H PRN, Lance Coon, MD   albuterol (PROVENTIL) (2.5 MG/3ML) 0.083% nebulizer solution 2.5 mg, 2.5 mg, Nebulization, Q4H PRN, Gouru, Aruna, MD, 2.5 mg at 01/14/19 0502   amiodarone (PACERONE) tablet 200 mg, 200 mg, Oral, Daily, Gouru, Aruna, MD, 200 mg at 01/19/19 1034   amLODipine (NORVASC) tablet 10 mg, 10 mg, Oral, Daily, Lance Coon, MD, 10 mg at 01/19/19 0857   apixaban (ELIQUIS) tablet 5 mg, 5 mg, Oral, BID, Cyndee Brightly M, RPH, 5 mg at 01/19/19 1034   aspirin chewable tablet 81 mg, 81 mg, Oral, Daily, Lance Coon, MD, 81 mg at 01/19/19 0857   brimonidine (ALPHAGAN) 0.15 % ophthalmic solution 1 drop, 1 drop, Both Eyes, TID, Fritzi Mandes, MD   docusate sodium (COLACE) capsule 100 mg, 100 mg, Oral, Daily, Fritzi Mandes, MD, 100  mg at 01/19/19 0857   feeding supplement (NEPRO CARB STEADY) liquid 237 mL, 237 mL, Oral, Q24H, Gouru, Aruna, MD, 237 mL at 84/53/64 6803   folic acid (FOLVITE) tablet 1 mg, 1 mg, Oral, Daily, Lance Coon, MD, 1 mg at 01/19/19 0858   furosemide (LASIX) injection 80 mg, 80 mg, Intravenous, BID, Gouru, Aruna, MD, 80 mg at 01/19/19 0857   guaiFENesin-dextromethorphan (ROBITUSSIN DM) 100-10 MG/5ML syrup 5 mL, 5 mL, Oral, Q4H PRN, Gouru, Aruna, MD, 5 mL at 01/02/19 0618   insulin aspart (novoLOG) injection 0-15 Units, 0-15 Units, Subcutaneous, TID WC, Wilhelmina Mcardle, MD, 2 Units at 01/19/19 1243   insulin aspart (novoLOG) injection 0-5 Units, 0-5 Units, Subcutaneous, QHS, Wilhelmina Mcardle, MD, 2 Units at 01/11/19 2133   ipratropium-albuterol (DUONEB)  0.5-2.5 (3) MG/3ML nebulizer solution 3 mL, 3 mL, Nebulization, Q6H, Gouru, Aruna, MD, 3 mL at 01/19/19 1410   latanoprost (XALATAN) 0.005 % ophthalmic solution 1 drop, 1 drop, Both Eyes, QHS, Patel, Sona, MD   magnesium oxide (MAG-OX) tablet 400 mg, 400 mg, Oral, BID, Lance Coon, MD, 400 mg at 01/19/19 1034   MEDLINE mouth rinse, 15 mL, Mouth Rinse, BID, Wilhelmina Mcardle, MD, 15 mL at 01/16/19 1334   metoprolol succinate (TOPROL-XL) 24 hr tablet 100 mg, 100 mg, Oral, Daily, Lanney Gins, Jyron Turman, MD, 100 mg at 01/19/19 1034   multivitamin with minerals tablet 1 tablet, 1 tablet, Oral, Daily, Lance Coon, MD, 1 tablet at 01/19/19 0857   potassium chloride 20 MEQ/15ML (10%) solution 20 mEq, 20 mEq, Oral, Daily, Mody, Sital, MD, 20 mEq at 01/19/19 1034   sodium chloride flush (NS) 0.9 % injection 10 mL, 10 mL, Intravenous, Q12H, Gouru, Aruna, MD, 10 mL at 01/19/19 1035   tamsulosin (FLOMAX) capsule 0.4 mg, 0.4 mg, Oral, Daily, Gouru, Aruna, MD, 0.4 mg at 01/19/19 1034   thiamine (VITAMIN B-1) tablet 100 mg, 100 mg, Oral, Daily, 100 mg at 01/19/19 0858 **OR** [DISCONTINUED] thiamine (B-1) injection 100 mg, 100 mg, Intravenous, Daily, Lance Coon, MD, 100 mg at 01/04/19 0908    ALLERGIES   Patient has no known allergies.     REVIEW OF SYSTEMS    Review of Systems:  Gen:  Denies  fever, sweats, chills weigh loss  HEENT: Denies blurred vision, double vision, ear pain, eye pain, hearing loss, nose bleeds, sore throat Cardiac:  No dizziness, chest pain or heaviness, chest tightness,edema Resp:   Denies cough or sputum porduction, shortness of breath,wheezing, hemoptysis,  Gi: Denies swallowing difficulty, stomach pain, nausea or vomiting, diarrhea, constipation, bowel incontinence Gu:  Denies bladder incontinence, burning urine Ext:   Denies Joint pain, stiffness or swelling Skin: Denies  skin rash, easy bruising or bleeding or hives Endoc:  Denies polyuria, polydipsia , polyphagia  or weight change Psych:   Denies depression, insomnia or hallucinations   Other:  All other systems negative   VS: BP 124/73 (BP Location: Right Arm)    Pulse 66    Temp 98.4 F (36.9 C) (Oral)    Resp 18    Ht 5\' 11"  (1.803 m)    Wt 96.5 kg    SpO2 91%    BMI 29.67 kg/m      PHYSICAL EXAM    GENERAL:NAD, no fevers, chills, no weakness no fatigue HEAD: Normocephalic, atraumatic.  EYES: Pupils equal, round, reactive to light. Extraocular muscles intact. No scleral icterus.  MOUTH: Moist mucosal membrane. Dentition intact. No abscess noted.  EAR, NOSE, THROAT: Clear without exudates.  No external lesions.  NECK: Supple. No thyromegaly. No nodules. No JVD.  PULMONARY: crackles at bases bilaterally CARDIOVASCULAR: S1 and S2. Regular rate and rhythm. No murmurs, rubs, or gallops. No edema. Pedal pulses 2+ bilaterally.  GASTROINTESTINAL: Soft, nontender, nondistended. No masses. Positive bowel sounds. No hepatosplenomegaly.  MUSCULOSKELETAL: No swelling, clubbing, or edema. Range of motion full in all extremities.  NEUROLOGIC: Cranial nerves II through XII are intact. No gross focal neurological deficits. Sensation intact. Reflexes intact.  SKIN: No ulceration, lesions, rashes, or cyanosis. Skin warm and dry. Turgor intact.  PSYCHIATRIC: Mood, affect within normal limits. The patient is awake, alert and oriented x 3. Insight, judgment intact.       IMAGING    Dg Chest 1 View  Result Date: 01/10/2019 CLINICAL DATA:  Pneumonia. Hx of asthma, A-fib, CAD CHF, COPD, HTN. EXAM: CHEST  1 VIEW COMPARISON:  01/09/19 FINDINGS: LEFT-sided pacemaker overlies stable enlarged cardiac silhouette. The diffuse lobar airspace disease in the RIGHT upper lobe. Mild airspace disease in the RIGHT lower lobe. No focal consolidation. No pneumothorax. Chronic elevation LEFT hemidiaphragm. IMPRESSION: 1. No significant interval change. 2. RIGHT lung asymmetric airspace disease representing asymmetric edema versus  asymmetric pulmonary infection. Electronically Signed   By: Suzy Bouchard M.D.   On: 01/10/2019 07:50   Dg Chest 1 View  Result Date: 12/31/2018 CLINICAL DATA:  SOB. EXAM: CHEST  1 VIEW COMPARISON:  12/30/2018. FINDINGS: Marked cardiac enlargement. Dual lead pacer/AICD. Mild vascular congestion. No overt edema. Chronic elevation LEFT hemidiaphragm. No pneumothorax. Aortic atherosclerosis. IMPRESSION: Cardiomegaly. Mild vascular congestion without overt edema. Stable appearance from priors. Electronically Signed   By: Staci Righter M.D.   On: 12/31/2018 08:20   Dg Abd 1 View  Result Date: 01/05/2019 CLINICAL DATA:  Nausea and vomiting EXAM: ABDOMEN - 1 VIEW COMPARISON:  09/28/2016 FINDINGS: There is a large amount of stool in the ascending and transverse colon. The bowel gas pattern is normal. No radio-opaque calculi or other significant radiographic abnormality are seen. Mild osteoarthritis of the right hip. IMPRESSION: Large amount of stool in the ascending and transverse colon. Electronically Signed   By: Kathreen Devoid   On: 01/05/2019 19:42   Ct Chest Wo Contrast  Result Date: 01/02/2019 CLINICAL DATA:  Dyspnea.  History of colon cancer. EXAM: CT CHEST WITHOUT CONTRAST TECHNIQUE: Multidetector CT imaging of the chest was performed following the standard protocol without IV contrast. COMPARISON:  Radiographs of December 31, 2018. CT scan of November 10, 2018. FINDINGS: Cardiovascular: Atherosclerosis of thoracic aorta is noted without aneurysm formation. Mild cardiomegaly is noted. Status post transcatheter aortic valve replacement. Coronary artery calcifications are noted. Mediastinum/Nodes: No enlarged mediastinal or axillary lymph nodes. Thyroid gland, trachea, and esophagus demonstrate no significant findings. Lungs/Pleura: No pneumothorax or pleural effusion is noted. Moderate left lower lobe subsegmental atelectasis or pneumonia is noted which is increased compared to prior exam. Mild right  posterior basilar subsegmental atelectasis or inflammation is noted posteriorly in right lower lobe. New ground-glass opacity seen posteriorly in the right upper lobe concerning for pneumonia. Upper Abdomen: No acute abnormality. Musculoskeletal: No chest wall mass or suspicious bone lesions identified. IMPRESSION: New ground-glass opacity seen posteriorly in right upper lobe concerning for pneumonia; this may represent typical or atypical infection, including viral etiology. Also noted are increased bilateral lower lobe opacities concerning for worsening atelectasis or pneumonia, left worse than right. Coronary artery calcifications are noted. Aortic Atherosclerosis (ICD10-I70.0). Electronically Signed   By: Marijo Conception, M.D.  On: 01/02/2019 16:18   Dg Chest Port 1 View  Result Date: 01/18/2019 CLINICAL DATA:  Continued shortness of breath since admission. Denies chest pain. EXAM: PORTABLE CHEST 1 VIEW COMPARISON:  01/15/2019. FINDINGS: Massive cardiomegaly. Dual lead pacer/AICD, stable. BILATERAL pulmonary opacities persist, but are slightly improved. These are mildly consolidative in the RIGHT upper lobe. These opacities could represent a combination of edema and infection. Continued surveillance is warranted. IMPRESSION: Slight improvement aeration. Electronically Signed   By: Staci Righter M.D.   On: 01/18/2019 07:26   Dg Chest Port 1 View  Result Date: 01/15/2019 CLINICAL DATA:  73 year old male with shortness of breath. Negative for COVID-19 on 01/03/2019. EXAM: PORTABLE CHEST 1 VIEW COMPARISON:  01/10/2019 and earlier. FINDINGS: Portable AP upright view at 0342 hours. Stable cardiomegaly and mediastinal contours. Prior TAVR. Calcified aortic atherosclerosis. Stable left chest AICD. Coarse bilateral pulmonary interstitial opacity is confluent in the right upper lobe and at the left lung base. Ventilation has worsened since 01/08/2019, and the right upper lobe opacity appears mildly progressed  since yesterday. No superimposed pneumothorax. Small left pleural effusion is difficult to exclude. IMPRESSION: 1. Coarse bilateral pulmonary opacity with mild progression in the right upper lobe since yesterday. 2. Stable cardiomegaly. Aortic Atherosclerosis (ICD10-I70.0). Electronically Signed   By: Genevie Ann M.D.   On: 01/15/2019 06:00   Dg Chest Port 1 View  Result Date: 01/09/2019 CLINICAL DATA:  Acute respiratory failure. EXAM: PORTABLE CHEST 1 VIEW COMPARISON:  Radiographs 01/08/2019 and 01/06/2019.  CT 01/02/2019. FINDINGS: 0937 hours. The left subclavian pacemaker leads appear stable. There is stable cardiomegaly and aortic atherosclerosis post TAVR. They were stable asymmetric airspace opacities in the right upper lobe and left lung base. No pneumothorax or significant pleural effusion. The bones appear unchanged. IMPRESSION: No significant change in asymmetric bilateral airspace opacities, presumably infectious/inflammatory. Stable cardiomegaly. Electronically Signed   By: Richardean Sale M.D.   On: 01/09/2019 10:24   Dg Chest Port 1 View  Result Date: 01/08/2019 CLINICAL DATA:  Respiratory failure. EXAM: PORTABLE CHEST 1 VIEW COMPARISON:  One-view chest x-ray 01/06/2019 FINDINGS: AND: FINDINGS: AND Heart is enlarged. Aortic valve replacement is noted. Atherosclerotic changes are present in the aorta. Pacing and defibrillator wires are stable. There is improving aeration of both lungs. Right upper lobe and left lower lobe airspace consolidation remain. Mild pulmonary vascular congestion is noted. IMPRESSION: 1. Slight improved aeration of both lungs with persistent right upper lobe and left lower lobe pneumonia. 2. Cardiomegaly with mild respiratory changes. Electronically Signed   By: San Morelle M.D.   On: 01/08/2019 06:40   Dg Chest Port 1 View  Result Date: 01/06/2019 CLINICAL DATA:  Respiratory failure. EXAM: PORTABLE CHEST 1 VIEW COMPARISON:  Chest x-ray from yesterday. FINDINGS:  Unchanged left chest wall AICD. Stable cardiomegaly status post TAVR. Atherosclerotic calcification of the aortic arch. Unchanged pulmonary vascular congestion and interstitial thickening. Increased patchy opacity in the right upper lobe. Unchanged left greater than right bibasilar atelectasis. No large pleural effusion. No pneumothorax. No acute osseous abnormality. IMPRESSION: 1. Increasing patchy opacity in the right upper lobe, concerning for pneumonia. 2. Unchanged mild pulmonary interstitial edema and bibasilar atelectasis. Electronically Signed   By: Titus Dubin M.D.   On: 01/06/2019 11:14   Dg Chest Port 1 View  Result Date: 01/05/2019 CLINICAL DATA:  Respiratory failure. EXAM: PORTABLE CHEST 1 VIEW COMPARISON:  CT of the chest on 01/02/2019 and chest x-ray on 12/31/2018 FINDINGS: Stable cardiac enlargement, appearance of pacing/ICD device and  appearance transcatheter aortic valve. Lungs show subtle opacities in the right upper lobe and both lung bases potentially representing pneumonia. There also may be a component of mild pulmonary interstitial edema and bibasilar atelectasis. No significant pleural effusion. No pneumothorax. IMPRESSION: Subtle opacities in the right upper lobe and both lung bases potentially representing multifocal pneumonia. There also may be a component of mild pulmonary interstitial edema and bibasilar atelectasis. Electronically Signed   By: Aletta Edouard M.D.   On: 01/05/2019 08:37   Dg Chest Port 1 View  Result Date: 12/30/2018 CLINICAL DATA:  Progressive shortness of breath. EXAM: PORTABLE CHEST 1 VIEW COMPARISON:  Radiograph yesterday. CT 11/10/2018 FINDINGS: Similar cardiomegaly. Unchanged mediastinal contours. Aortic atherosclerosis with aortic root replacement. Left-sided pacemaker remains in place. Vascular congestion without overt pulmonary edema. Mild elevation of left hemidiaphragm. Improved atelectasis in the left mid lung. No large pleural effusion. No  pneumothorax. Unchanged osseous structures. IMPRESSION: Cardiomegaly with vascular congestion. No overt pulmonary edema. Improved left midlung atelectasis. Aortic Atherosclerosis (ICD10-I70.0). Electronically Signed   By: Keith Rake M.D.   On: 12/30/2018 02:01   Dg Chest Portable 1 View  Result Date: 12/29/2018 CLINICAL DATA:  Weakness with apparent hypotension EXAM: PORTABLE CHEST 1 VIEW COMPARISON:  Chest radiograph November 05, 2018 and chest CT November 10, 2018 FINDINGS: There is atelectatic change in the left mid lung. There is no appreciable edema or consolidation. Heart is mildly enlarged with pulmonary vascularity normal. Pacemaker leads are attached the right atrium and right ventricle. There is aortic atherosclerosis. No adenopathy. No bone lesions. There is calcification in each carotid artery. IMPRESSION: Left midlung atelectasis. No edema or consolidation. Mild cardiomegaly. Pacemaker leads attached to right atrium and right ventricle. Aortic Atherosclerosis (ICD10-I70.0). Bilateral carotid artery calcification noted. Electronically Signed   By: Lowella Grip III M.D.   On: 12/29/2018 21:14   [place] Ekg Site Rite  Result Date: 01/14/2019 If Site Rite image not attached, placement could not be confirmed due to current cardiac rhythm.     ASSESSMENT/PLAN     Acute on chronic hypoxemic respiratory failure -acute decompensated diastolic CHF exacerbation -  -Continue diuresis - now total >15.325 L negative -Continue strict I's and O's with 1200 cc fluid restriction -Discussed possible LTAC transfer if diuresis is slow due to increased length of stay at this point   -Bibasilar atelectasis-have reviewed with patient appropriate incentive spirometry and Acapella device usage, encourage use each hour multiple times-discussed with Bambi RT appreciate input.  -Please perform exertional walk to delineate amount of oxygen needed  to keep SPO2 over 88%-in preparation to discharge home onincreasedoxygen   - will need outpatient workup for pulmonary hypertension via [facility] - RVSP was significantly elevated on TTE will discuss with cardiology - appreciate input  -appreciate physical therapy input     COPD -continue nebulizer therapy - IS/ Chest PT - no need for steroidsat this time    Thank you for allowing me to participate in the care of this patient.   Patient/Family are satisfied with care plan and all questions have been answered.  This document was prepared using Dragon voice recognition software and may include unintentional dictation errors.     Ottie Glazier, M.D.  Division of [facility]

## 2019-01-19 NOTE — Progress Notes (Signed)
Ch f/u w/ pt to see how well pt was progressing. Pt shared that he hopes to be d/c soon. Pt also was firm on not wanting to go to a SNF upon d/c and feels he would be OK with only H-H and the assistance of his daughter. Ch understood the pt wanting to return home. Pt shared that he was confident in his mobility in spite of needing to use O2. Ch shared words of encouragement and that he would received clarity on his d/c plan soon. Pt was thankful for the f/u visit.    01/19/19 1000  Clinical Encounter Type  Visited With Patient  Visit Type Psychological support;Spiritual support;Social support  Spiritual Encounters  Spiritual Needs Emotional;Grief support  Stress Factors  Patient Stress Factors Health changes;Major life changes  Family Stress Factors None identified

## 2019-01-20 LAB — GLUCOSE, CAPILLARY
Glucose-Capillary: 94 mg/dL (ref 70–99)
Glucose-Capillary: 126 mg/dL — ABNORMAL HIGH (ref 70–99)
Glucose-Capillary: 131 mg/dL — ABNORMAL HIGH (ref 70–99)
Glucose-Capillary: 123 mg/dL — ABNORMAL HIGH (ref 70–99)

## 2019-01-20 MED ORDER — IPRATROPIUM-ALBUTEROL 0.5-2.5 (3) MG/3ML IN SOLN
3.0000 mL | Freq: Three times a day (TID) | RESPIRATORY_TRACT | Status: DC
  Administered 2019-01-20 – 2019-01-21 (×4): 3 mL via RESPIRATORY_TRACT
  Filled 2019-01-20 (×4): qty 3

## 2019-01-20 MED ORDER — ALBUTEROL SULFATE (2.5 MG/3ML) 0.083% IN NEBU: 2.5000 mg | INHALATION_SOLUTION | RESPIRATORY_TRACT | Status: DC | PRN

## 2019-01-20 MED ORDER — FUROSEMIDE 10 MG/ML IJ SOLN
120.0000 mg | Freq: Two times a day (BID) | INTRAVENOUS | Status: DC
  Administered 2019-01-20 – 2019-01-21 (×2): 120 mg via INTRAVENOUS
  Filled 2019-01-20 (×4): qty 12

## 2019-01-20 NOTE — Progress Notes (Signed)
Received notice that patient does not want to use bipap tonight.  Followed up with patient. No distress noted. Patient awake and alert. Able to answer questions appropriately. Patient states he does not wish to use bipap tonight. Encouraged use with patient. He will have RN to call if he changes his mind.

## 2019-01-20 NOTE — Progress Notes (Signed)
Went to patient for IV difficult consult. RAFA IV assessed flushed with GBR. Per [facility] RN, pump showing occluded. Noted IV tubing clamped. IV meds restore and reconnected, infusing well, RN made aware.

## 2019-01-20 NOTE — Progress Notes (Signed)
Physical Therapy Treatment Patient Details Name: SIRIS HOOS MRN: [id number] DOB: 1946/09/02 Today's Date: 01/20/2019    History of Present Illness 73 y.o. male with a known history of atrial fibrillation on eliquis, CAD, nonischemic cardiomyopathy with last known EF of 25% status post AICD and pacemaker, CKD, history of colonic adenocarcinoma status post partial colectomy, history of renal cell carcinoma status post left nephrectomy, COPD on as needed home oxygen presented to the hospital secondary to worsening ED with a complaint of weakness and fatigue.  He was found here to have an elevated creatinine he has elevated troponin and BNP.  While hospitalized he had increased respiratory distress and needed to be transferred to CCU, possible covid-19 suspected, but testing was negative.    PT Comments    Patient is able to demonstrate good carry over of supine to sit technique with ind and STS with RW modI with good safety awareness. Patient ambulated more this session (141ft) with O2 increased from 4L to 6L and sats decreasing to no less than 88% with ambulation. Patient with better job of carry over of proper breathing throughout ambulation this session, with continued need for cuing reminder. Following ambulation and sit patient's O2 decreased to 83% briefly, which he is able to raise with PLB before being returned to 4L O2. Would benefit from skilled PT to address above deficits and promote optimal return to PLOF    Follow Up Recommendations  Home health PT;Supervision - Intermittent     Equipment Recommendations  None recommended by PT    Recommendations for Other Services       Precautions / Restrictions Restrictions Weight Bearing Restrictions: No    Mobility  Bed Mobility Overal bed mobility: Independent             General bed mobility comments: Patient able to complete supine > sit ind with O2 dropping to 83%. Patient able to elevate this to 88% with PLB and O2  elevated   Transfers Overall transfer level: Needs assistance Equipment used: Rolling walker (2 wheeled) Transfers: Sit to/from Stand Sit to Stand: Modified independent (Device/Increase time)         General transfer comment: Patient able to stand with RW modI with good carry over of safety cuing from previous sessions.   Ambulation/Gait Ambulation/Gait assistance: Supervision Gait Distance (Feet): 150 Feet Assistive device: Rolling walker (2 wheeled) Gait Pattern/deviations: Narrow base of support Gait velocity: decreased Gait velocity interpretation: <1.31 ft/sec, indicative of household ambulator General Gait Details: able to ambulate with 6L O2 not dropping below 88% and good safety. following O2 touched down to 83% briefly    Stairs   Stairs assistance: Min guard Stair Management: Two rails       Wheelchair Mobility    Modified Rankin (Stroke Patients Only)       Balance                                            Cognition Arousal/Alertness: Awake/alert Behavior During Therapy: WFL for tasks assessed/performed Overall Cognitive Status: Within Functional Limits for tasks assessed                                        Exercises Other Exercises Other Exercises: Following ind supine to sit O2 decreased to 83%,  increased to 88% with PLB and )2 increased from 4L to 6L. Patient able to complete STS transfer with modI with RW. Patietn walked 163ft with supervision with O2 not dropping below 88% and better continuing of PLB with cuing throughout ambulation. Following ambulation O2 touched down to 83% briefly, returned to 90% with PLB and patient returned to 4L of O2    General Comments        Pertinent Vitals/Pain Pain Assessment: No/denies pain Pain Location: Reports better urination without pain    Home Living                      Prior Function            PT Goals (current goals can now be found in the care  plan section) Acute Rehab PT Goals Patient Stated Goal: go home... but not until he's feeling better PT Goal Formulation: With patient Time For Goal Achievement: 01/23/19 Potential to Achieve Goals: Good Progress towards PT goals: Progressing toward goals    Frequency    Min 2X/week      PT Plan Current plan remains appropriate    Co-evaluation              AM-PAC PT "6 Clicks" Mobility   Outcome Measure  Help needed turning from your back to your side while in a flat bed without using bedrails?: None Help needed moving from lying on your back to sitting on the side of a flat bed without using bedrails?: None Help needed moving to and from a bed to a chair (including a wheelchair)?: None Help needed standing up from a chair using your arms (e.g., wheelchair or bedside chair)?: None Help needed to walk in hospital room?: A Little Help needed climbing 3-5 steps with a railing? : A Little 6 Click Score: 22    End of Session Equipment Utilized During Treatment: Gait belt;Oxygen Activity Tolerance: Patient tolerated treatment well Patient left: in chair;in bed;with bed alarm set Nurse Communication: Mobility status PT Visit Diagnosis: Muscle weakness (generalized) (M62.81);Difficulty in walking, not elsewhere classified (R26.2)     Time: 3570-1779 PT Time Calculation (min) (ACUTE ONLY): 28 min  Charges:  $Therapeutic Activity: 23-37 mins                     Shelton Silvas PT, DPT   Shelton Silvas 01/20/2019, 11:51 AM

## 2019-01-20 NOTE — Progress Notes (Signed)
[facility] [place] NAME: Darius Taylor    MR#:  [id number]  DATE OF BIRTH:  December 10, 1945  SUBJECTIVE:   Patient is on 4 liter [place] oxygen now Stool for occult blood is positive denies any hematemesis or abdominal pain UOP 2050 cc /24hr    REVIEW OF SYSTEMS:    Review of Systems  Constitutional: Negative for fever, chills weight loss HENT: Negative for ear pain, nosebleeds, congestion, facial swelling, rhinorrhea, neck pain, neck stiffness and ear discharge.   Respiratory: no cough, ++IMPROVED shortness of breath, no wheezing  Cardiovascular: Negative for chest pain, palpitations and + MILD leg swelling.  Gastrointestinal: Negative for heartburn, abdominal pain, vomiting, diarrhea or consitpation Genitourinary: Negative for dysuria, urgency, frequency, hematuria Musculoskeletal: Negative for back pain or joint pain Neurological: Negative for dizziness, seizures, syncope, focal weakness,  numbness and headaches.  Hematological: Does not bruise/bleed easily.  Psychiatric/Behavioral: Negative for hallucinations, confusion, dysphoric mood  Tolerating Diet: yes   DRUG ALLERGIES:  No Known Allergies  VITALS:  Blood pressure (!) 128/57, pulse 61, temperature 98 F (36.7 C), temperature source Oral, resp. rate 19, height 5\' 11"  (1.803 m), weight 102 kg, SpO2 94 %.  PHYSICAL EXAMINATION:  Constitutional: Appears well-developed and well-nourished. No distress.obese HENT: Normocephalic. . Eyes: Conjunctivae and EOM are normal. PERRLA, no scleral icterus.  Neck: Normal ROM. Neck supple. No JVD. No tracheal deviation. CVS: RRR, S1/S2 +, 3/6  murmurs, no gallops, no carotid bruit.  Pulmonary: Normal respiratory effort with some mild rhonchi right upper lobe.   Abdominal: Soft. BS +,  no distension, tenderness, rebound or guarding.  Musculoskeletal: Normal range of motion. 1+ LE edema and no tenderness.  Neuro: Alert. CN 2-12 grossly intact. No  focal deficits. Skin: Skin is warm and dry. No rash noted. Psychiatric: Normal mood and affect.   LABORATORY PANEL:   CBC Recent Labs  Lab 01/19/19 0431  WBC 9.7  HGB 7.9*  HCT 26.0*  PLT 250   ------------------------------------------------------------------------------------------------------------------  Chemistries  Recent Labs  Lab 01/19/19 0431  NA 141  K 3.8  CL 103  CO2 31  GLUCOSE 114*  BUN 55*  CREATININE 1.68*  CALCIUM 8.8*   ------------------------------------------------------------------------------------------------------------------  Cardiac Enzymes No results for input(s): TROPONINI in the last 168 hours.------------------------------------------------------------------------------------------------------------------  RADIOLOGY:  No results found.   ASSESSMENT AND PLAN:   73 year old male with a history of PAF, EtOH abuse and COPD who presented to the emergency room due to generalized weakness and found to have elevated creatinine.   1..  Acute on chronic hypoxic respiratory failure: Possible underlying pulmonary hypertension given right ventricular systolic pressure is high and acute on chronic diastolic HF -Acute hypoxic respiratory failure with worsening O2 requirements due to  acute on chronic diastolic heart failure underlying pulmonary hypertension-patient needs right heart cath once clinically stable. -Healthcare associated pneumonia treated with a IV antibiotics cefepime and vancomycin and completed antibiotic course -Increased Lasix dose to 80 mg twice daily as patient still has significant lower extremity edema and bilateral rales and rhonchi.--now started on lasix gtt on 01/20/2019   -Fluid restriction to 1200 cc/day -now on 4 liter [place] will try to wean off.  Patient uses 2 L of oxygen at home as needed  -If no clinical improvement will consider LTAC patient is agreeable but prefers talking to social worker first--pt adamant and wants to  go home -Appreciate pulmonology follow-up by Dr Lanney Gins -Patient has tested negative for COVID-19.  Respiratory viral panel  was also negative. -MRSA PCR was negative.  I-Flutter valve device  2  Acute on chronic diastolic heart failure:  Continue IV lasix gtt Monitor intake and output  3. 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 -Continue close monitoring and avoid nephrotoxins  4.PAF, history of aortic stenosis status post TAVR: Patient currently in sinus rhythm Continue Eliquis.  Resume home medication amiodarone Stool for occult blood is positive could be from Eliquis use  5.COPD without signs of exacerbation: Continue nebs  6.Hypertension continue current medications and titrate as needed  7. Anemia of chronic disease    -Status post transfusion of 1 unit of blood hemoglobin at 7.9 today -Stool for occult blood is positive-- patient is at a high risk given his poor lung and heart condition. Patient does not want any procedure done. His eliquis has been resumed. -GI consult placed and notified Dr. Alice Reichert is aware -Clear liquid diet for now - MCV 99.6 -  anemia labs vitamin B12 767 ferritin, folate, serum iron levels are normal   8.Dysuria with history of bladder cancer- Status post cystoscopy care at [facility] PSA normal Flomax Discussed with urology Dr. Jeb Levering agreeable with Flomax.  Post void residual is less than 200.  Not recommending Foley catheter and recommended outpatient follow-up with [facility] urology   Discussed patient regarding discharge planning. He is adamant and wants to go home. He is down to 4 L nasal cannula. Will continue to wean if possible else will try to discharge himto home with home health when diuresed appropriately  Patient is agreeable.  Management plans discussed with the patient and he is in agreement.  CODE STATUS: full  TOTAL TIME TAKING CARE OF  THIS PATIENT: 34 minutes.    Fritzi Mandes M.D on 01/20/2019 at 1:51 PM  Between 7am to 6pm - Pager - [phone] After 6pm go to [email or web address] - password [email or web address] [facility]  Office  [phone]  CC: Primary care physician; [employer]  Note: This dictation was prepared with Diplomatic Services operational officer dictation along with smaller Company secretary. Any transcriptional errors that result from this process are unintentional.

## 2019-01-20 NOTE — Progress Notes (Signed)
Nutrition Follow-up  RD working remotely.  DOCUMENTATION CODES:   Not applicable  INTERVENTION:   Continue Nepro Shake po once daily, each supplement provides 425 kcal and 19 grams protein.  Continue MVI daily, thiamine 458 mg daily, folic acid 1 mg daily.  NUTRITION DIAGNOSIS:   Increased nutrient needs related to chronic illness(COPD, CHF, etoh abuse ) as evidenced by increased estimated needs.  Ongoing.  GOAL:   Patient will meet greater than or equal to 90% of their needs  Progressing.  MONITOR:   PO intake, Supplement acceptance, Labs, Weight trends, I & O's, Skin  REASON FOR ASSESSMENT:   Consult Assessment of nutrition requirement/status  ASSESSMENT:   73 y/o male with h/o COPD, CKD III, etoh abuse admitted with CHF Pt s/p nephrectomy in 2000  Pt continues to do well; pt eating 100% of meals and drinking supplements. Per chart, pt up 12lbs since admit; pt noted to have significant LE edema and pulmonary edema. Recommend continue supplements and vitamins.   Medications reviewed and include: Eliquis, aspirin, Colace, folic acid, lasix, insulin, magnesium oxide, MVI, thiamine, KCl  Labs reviewed: BUN 55(H), creat 1.68(H)- 4/27 Hgb 7.9(L), Hct 26.0(L)  Diet Order:   Diet Order            Diet Heart Room service appropriate? Yes; Fluid consistency: Thin; Fluid restriction: 1200 mL Fluid  Diet effective now             EDUCATION NEEDS:   Not appropriate for education at this time  Skin:  Skin Assessment: Reviewed RN Assessment(ecchymosis)  Last BM:  4/27- type 4  Height:   Ht Readings from Last 1 Encounters:  01/03/19 5\' 11"  (1.803 m)   Weight:   Wt Readings from Last 1 Encounters:  01/20/19 102 kg   Ideal Body Weight:  78 kg  BMI:  Body mass index is 31.36 kg/m.  Estimated Nutritional Needs:   Kcal:  2000-2300kcal/day   Protein:  100-110g/day   Fluid:  1536ml/day or per MD  Koleen Distance MS, RD, LDN Pager #-  [phone] Office#- [phone] After Hours Pager: [phone]

## 2019-01-20 NOTE — Progress Notes (Signed)
Pulmonary Medicine          Date: 01/20/2019,   MRN# [id number] Darius Taylor 02-06-1946     AdmissionWeight: 99.8 kg                 CurrentWeight: 102 kg      CHIEF COMPLAINT:   Acute hypoxemic respiratory failure   SUBJECTIVE   Respiratory therapist at bedside, weaning down O2 to 4 L nasal cannula.  Patient reports clinical improvement   PAST MEDICAL HISTORY   Past Medical History:  Diagnosis Date   Aortic stenosis    Arrhythmia    atrial fibrillation   Asthma    Atrial fibrillation (HCC)    CAD (coronary artery disease)    Cancer (HCC)    colon, bladder and kidney   CHF (congestive heart failure) (HCC)    Colon adenocarcinoma (HCC)    COPD (chronic obstructive pulmonary disease) ([place])    H/O ventricular tachycardia    Hypertension    Nonischemic cardiomyopathy ([place])    Status post AICD and pacemaker   Renal cell carcinoma ([place])      SURGICAL HISTORY   Past Surgical History:  Procedure Laterality Date   CARDIAC DEFIBRILLATOR PLACEMENT     CARDIAC DEFIBRILLATOR PLACEMENT     COLONOSCOPY WITH PROPOFOL N/A 06/10/2018   Procedure: COLONOSCOPY WITH PROPOFOL;  Surgeon: Lin Landsman, MD;  Location: [facility];  Service: Gastroenterology;  Laterality: N/A;   CORONARY ANGIOPLASTY  09/25/2016   IRRIGATION AND DEBRIDEMENT HEMATOMA Left 10/24/2016   Procedure: IRRIGATION AND DEBRIDEMENT HEMATOMA;  Surgeon: Florene Glen, MD;  Location: [facility] ORS;  Service: General;  Laterality: Left;   KIDNEY SURGERY     left ne[hrectomy for RCC   PACEMAKER IMPLANT     PARTIAL COLECTOMY       FAMILY HISTORY   Family History  Problem Relation Age of Onset   Hypertension Mother    Stroke Father      SOCIAL HISTORY   Social History   Tobacco Use   Smoking status: Former Smoker   Smokeless tobacco: Never Used  Substance Use Topics   Alcohol use: No   Drug use: No     MEDICATIONS    Home Medication:      Current Medication:  Current Facility-Administered Medications:    0.9 %  sodium chloride infusion (Manually program via Guardrails IV Fluids), , Intravenous, Once, Gouru, Aruna, MD   0.9 %  sodium chloride infusion, , Intravenous, PRN, Gouru, Aruna, MD, Stopped at 01/11/19 0047   acetaminophen (TYLENOL) tablet 650 mg, 650 mg, Oral, Q6H PRN, 650 mg at 01/19/19 2030 **OR** acetaminophen (TYLENOL) suppository 650 mg, 650 mg, Rectal, Q6H PRN, Lance Coon, MD   albuterol (PROVENTIL) (2.5 MG/3ML) 0.083% nebulizer solution 2.5 mg, 2.5 mg, Nebulization, Q4H PRN, Gouru, Aruna, MD, 2.5 mg at 01/14/19 0502   amiodarone (PACERONE) tablet 200 mg, 200 mg, Oral, Daily, Gouru, Aruna, MD, 200 mg at 01/20/19 0831   amLODipine (NORVASC) tablet 10 mg, 10 mg, Oral, Daily, Lance Coon, MD, 10 mg at 01/20/19 0831   apixaban (ELIQUIS) tablet 5 mg, 5 mg, Oral, BID, Cyndee Brightly M, RPH, 5 mg at 01/20/19 4098   aspirin chewable tablet 81 mg, 81 mg, Oral, Daily, Lance Coon, MD, 81 mg at 01/20/19 0832   brimonidine (ALPHAGAN) 0.15 % ophthalmic solution 1 drop, 1 drop, Both Eyes, TID, Fritzi Mandes, MD, 1 drop at 01/20/19 0834   docusate sodium (COLACE) capsule 100 mg,  100 mg, Oral, Daily, Fritzi Mandes, MD, 100 mg at 01/20/19 3546   feeding supplement (NEPRO CARB STEADY) liquid 237 mL, 237 mL, Oral, Q24H, Gouru, Aruna, MD, 237 mL at 56/81/27 5170   folic acid (FOLVITE) tablet 1 mg, 1 mg, Oral, Daily, Lance Coon, MD, 1 mg at 01/20/19 0831   furosemide (LASIX) injection 80 mg, 80 mg, Intravenous, BID, Gouru, Aruna, MD, 80 mg at 01/20/19 0831   guaiFENesin-dextromethorphan (ROBITUSSIN DM) 100-10 MG/5ML syrup 5 mL, 5 mL, Oral, Q4H PRN, Gouru, Aruna, MD, 5 mL at 01/02/19 0618   insulin aspart (novoLOG) injection 0-15 Units, 0-15 Units, Subcutaneous, TID WC, Wilhelmina Mcardle, MD, 2 Units at 01/19/19 1720   insulin aspart (novoLOG) injection 0-5 Units, 0-5 Units, Subcutaneous, QHS, Wilhelmina Mcardle, MD, 2  Units at 01/11/19 2133   ipratropium-albuterol (DUONEB) 0.5-2.5 (3) MG/3ML nebulizer solution 3 mL, 3 mL, Nebulization, Q6H, Gouru, Aruna, MD, 3 mL at 01/20/19 0733   latanoprost (XALATAN) 0.005 % ophthalmic solution 1 drop, 1 drop, Both Eyes, QHS, Patel, Sona, MD, 1 drop at 01/19/19 2033   magnesium oxide (MAG-OX) tablet 400 mg, 400 mg, Oral, BID, Lance Coon, MD, 400 mg at 01/20/19 0831   MEDLINE mouth rinse, 15 mL, Mouth Rinse, BID, Wilhelmina Mcardle, MD, 15 mL at 01/19/19 2031   metoprolol succinate (TOPROL-XL) 24 hr tablet 100 mg, 100 mg, Oral, Daily, Lanney Gins, Edilberto Roosevelt, MD, 100 mg at 01/20/19 0174   multivitamin with minerals tablet 1 tablet, 1 tablet, Oral, Daily, Lance Coon, MD, 1 tablet at 01/20/19 0831   potassium chloride 20 MEQ/15ML (10%) solution 20 mEq, 20 mEq, Oral, Daily, Mody, Sital, MD, 20 mEq at 01/20/19 9449   sodium chloride flush (NS) 0.9 % injection 10 mL, 10 mL, Intravenous, Q12H, Gouru, Aruna, MD, 10 mL at 01/20/19 0834   tamsulosin (FLOMAX) capsule 0.4 mg, 0.4 mg, Oral, Daily, Gouru, Aruna, MD, 0.4 mg at 01/20/19 0831   thiamine (VITAMIN B-1) tablet 100 mg, 100 mg, Oral, Daily, 100 mg at 01/20/19 6759 **OR** [DISCONTINUED] thiamine (B-1) injection 100 mg, 100 mg, Intravenous, Daily, Lance Coon, MD, 100 mg at 01/04/19 0908    ALLERGIES   Patient has no known allergies.     REVIEW OF SYSTEMS    Review of Systems:  Gen:  Denies  fever, sweats, chills weigh loss  HEENT: Denies blurred vision, double vision, ear pain, eye pain, hearing loss, nose bleeds, sore throat Cardiac:  No dizziness, chest pain or heaviness, chest tightness,edema Resp:   Denies cough or sputum porduction, shortness of breath,wheezing, hemoptysis,  Gi: Denies swallowing difficulty, stomach pain, nausea or vomiting, diarrhea, constipation, bowel incontinence Gu:  Denies bladder incontinence, burning urine Ext:   Denies Joint pain, stiffness or swelling Skin: Denies  skin rash,  easy bruising or bleeding or hives Endoc:  Denies polyuria, polydipsia , polyphagia or weight change Psych:   Denies depression, insomnia or hallucinations   Other:  All other systems negative   VS: BP (!) 128/57 (BP Location: Left Arm)    Pulse 61    Temp 98 F (36.7 C) (Oral)    Resp 19    Ht 5\' 11"  (1.803 m)    Wt 102 kg    SpO2 94%    BMI 31.36 kg/m      PHYSICAL EXAM    GENERAL:NAD, no fevers, chills, no weakness no fatigue HEAD: Normocephalic, atraumatic.  EYES: Pupils equal, round, reactive to light. Extraocular muscles intact. No scleral icterus.  MOUTH: Moist  mucosal membrane. Dentition intact. No abscess noted.  EAR, NOSE, THROAT: Clear without exudates. No external lesions.  NECK: Supple. No thyromegaly. No nodules. No JVD.  PULMONARY: Mild bibasilar crackles without rhonchorous breath sounds or wheezing CARDIOVASCULAR: S1 and S2. Regular rate and rhythm. No murmurs, rubs, or gallops. No edema. Pedal pulses 2+ bilaterally.  GASTROINTESTINAL: Soft, nontender, nondistended. No masses. Positive bowel sounds. No hepatosplenomegaly.  MUSCULOSKELETAL: No swelling, clubbing, or edema. Range of motion full in all extremities.  NEUROLOGIC: Cranial nerves II through XII are intact. No gross focal neurological deficits. Sensation intact. Reflexes intact.  SKIN: No ulceration, lesions, rashes, or cyanosis. Skin warm and dry. Turgor intact.  PSYCHIATRIC: Mood, affect within normal limits. The patient is awake, alert and oriented x 3. Insight, judgment intact.       IMAGING    Dg Chest 1 View  Result Date: 01/10/2019 CLINICAL DATA:  Pneumonia. Hx of asthma, A-fib, CAD CHF, COPD, HTN. EXAM: CHEST  1 VIEW COMPARISON:  01/09/19 FINDINGS: LEFT-sided pacemaker overlies stable enlarged cardiac silhouette. The diffuse lobar airspace disease in the RIGHT upper lobe. Mild airspace disease in the RIGHT lower lobe. No focal consolidation. No pneumothorax. Chronic elevation LEFT hemidiaphragm.  IMPRESSION: 1. No significant interval change. 2. RIGHT lung asymmetric airspace disease representing asymmetric edema versus asymmetric pulmonary infection. Electronically Signed   By: Suzy Bouchard M.D.   On: 01/10/2019 07:50   Dg Chest 1 View  Result Date: 12/31/2018 CLINICAL DATA:  SOB. EXAM: CHEST  1 VIEW COMPARISON:  12/30/2018. FINDINGS: Marked cardiac enlargement. Dual lead pacer/AICD. Mild vascular congestion. No overt edema. Chronic elevation LEFT hemidiaphragm. No pneumothorax. Aortic atherosclerosis. IMPRESSION: Cardiomegaly. Mild vascular congestion without overt edema. Stable appearance from priors. Electronically Signed   By: Staci Righter M.D.   On: 12/31/2018 08:20   Dg Abd 1 View  Result Date: 01/05/2019 CLINICAL DATA:  Nausea and vomiting EXAM: ABDOMEN - 1 VIEW COMPARISON:  09/28/2016 FINDINGS: There is a large amount of stool in the ascending and transverse colon. The bowel gas pattern is normal. No radio-opaque calculi or other significant radiographic abnormality are seen. Mild osteoarthritis of the right hip. IMPRESSION: Large amount of stool in the ascending and transverse colon. Electronically Signed   By: Kathreen Devoid   On: 01/05/2019 19:42   Ct Chest Wo Contrast  Result Date: 01/02/2019 CLINICAL DATA:  Dyspnea.  History of colon cancer. EXAM: CT CHEST WITHOUT CONTRAST TECHNIQUE: Multidetector CT imaging of the chest was performed following the standard protocol without IV contrast. COMPARISON:  Radiographs of December 31, 2018. CT scan of November 10, 2018. FINDINGS: Cardiovascular: Atherosclerosis of thoracic aorta is noted without aneurysm formation. Mild cardiomegaly is noted. Status post transcatheter aortic valve replacement. Coronary artery calcifications are noted. Mediastinum/Nodes: No enlarged mediastinal or axillary lymph nodes. Thyroid gland, trachea, and esophagus demonstrate no significant findings. Lungs/Pleura: No pneumothorax or pleural effusion is noted. Moderate  left lower lobe subsegmental atelectasis or pneumonia is noted which is increased compared to prior exam. Mild right posterior basilar subsegmental atelectasis or inflammation is noted posteriorly in right lower lobe. New ground-glass opacity seen posteriorly in the right upper lobe concerning for pneumonia. Upper Abdomen: No acute abnormality. Musculoskeletal: No chest wall mass or suspicious bone lesions identified. IMPRESSION: New ground-glass opacity seen posteriorly in right upper lobe concerning for pneumonia; this may represent typical or atypical infection, including viral etiology. Also noted are increased bilateral lower lobe opacities concerning for worsening atelectasis or pneumonia, left worse than right. Coronary  artery calcifications are noted. Aortic Atherosclerosis (ICD10-I70.0). Electronically Signed   By: Marijo Conception, M.D.   On: 01/02/2019 16:18   Dg Chest Port 1 View  Result Date: 01/18/2019 CLINICAL DATA:  Continued shortness of breath since admission. Denies chest pain. EXAM: PORTABLE CHEST 1 VIEW COMPARISON:  01/15/2019. FINDINGS: Massive cardiomegaly. Dual lead pacer/AICD, stable. BILATERAL pulmonary opacities persist, but are slightly improved. These are mildly consolidative in the RIGHT upper lobe. These opacities could represent a combination of edema and infection. Continued surveillance is warranted. IMPRESSION: Slight improvement aeration. Electronically Signed   By: Staci Righter M.D.   On: 01/18/2019 07:26   Dg Chest Port 1 View  Result Date: 01/15/2019 CLINICAL DATA:  73 year old male with shortness of breath. Negative for COVID-19 on 01/03/2019. EXAM: PORTABLE CHEST 1 VIEW COMPARISON:  01/10/2019 and earlier. FINDINGS: Portable AP upright view at 0342 hours. Stable cardiomegaly and mediastinal contours. Prior TAVR. Calcified aortic atherosclerosis. Stable left chest AICD. Coarse bilateral pulmonary interstitial opacity is confluent in the right upper lobe and at the left  lung base. Ventilation has worsened since 01/08/2019, and the right upper lobe opacity appears mildly progressed since yesterday. No superimposed pneumothorax. Small left pleural effusion is difficult to exclude. IMPRESSION: 1. Coarse bilateral pulmonary opacity with mild progression in the right upper lobe since yesterday. 2. Stable cardiomegaly. Aortic Atherosclerosis (ICD10-I70.0). Electronically Signed   By: Genevie Ann M.D.   On: 01/15/2019 06:00   Dg Chest Port 1 View  Result Date: 01/09/2019 CLINICAL DATA:  Acute respiratory failure. EXAM: PORTABLE CHEST 1 VIEW COMPARISON:  Radiographs 01/08/2019 and 01/06/2019.  CT 01/02/2019. FINDINGS: 0937 hours. The left subclavian pacemaker leads appear stable. There is stable cardiomegaly and aortic atherosclerosis post TAVR. They were stable asymmetric airspace opacities in the right upper lobe and left lung base. No pneumothorax or significant pleural effusion. The bones appear unchanged. IMPRESSION: No significant change in asymmetric bilateral airspace opacities, presumably infectious/inflammatory. Stable cardiomegaly. Electronically Signed   By: Richardean Sale M.D.   On: 01/09/2019 10:24   Dg Chest Port 1 View  Result Date: 01/08/2019 CLINICAL DATA:  Respiratory failure. EXAM: PORTABLE CHEST 1 VIEW COMPARISON:  One-view chest x-ray 01/06/2019 FINDINGS: AND: FINDINGS: AND Heart is enlarged. Aortic valve replacement is noted. Atherosclerotic changes are present in the aorta. Pacing and defibrillator wires are stable. There is improving aeration of both lungs. Right upper lobe and left lower lobe airspace consolidation remain. Mild pulmonary vascular congestion is noted. IMPRESSION: 1. Slight improved aeration of both lungs with persistent right upper lobe and left lower lobe pneumonia. 2. Cardiomegaly with mild respiratory changes. Electronically Signed   By: San Morelle M.D.   On: 01/08/2019 06:40   Dg Chest Port 1 View  Result Date:  01/06/2019 CLINICAL DATA:  Respiratory failure. EXAM: PORTABLE CHEST 1 VIEW COMPARISON:  Chest x-ray from yesterday. FINDINGS: Unchanged left chest wall AICD. Stable cardiomegaly status post TAVR. Atherosclerotic calcification of the aortic arch. Unchanged pulmonary vascular congestion and interstitial thickening. Increased patchy opacity in the right upper lobe. Unchanged left greater than right bibasilar atelectasis. No large pleural effusion. No pneumothorax. No acute osseous abnormality. IMPRESSION: 1. Increasing patchy opacity in the right upper lobe, concerning for pneumonia. 2. Unchanged mild pulmonary interstitial edema and bibasilar atelectasis. Electronically Signed   By: Titus Dubin M.D.   On: 01/06/2019 11:14   Dg Chest Port 1 View  Result Date: 01/05/2019 CLINICAL DATA:  Respiratory failure. EXAM: PORTABLE CHEST 1 VIEW COMPARISON:  CT  of the chest on 01/02/2019 and chest x-ray on 12/31/2018 FINDINGS: Stable cardiac enlargement, appearance of pacing/ICD device and appearance transcatheter aortic valve. Lungs show subtle opacities in the right upper lobe and both lung bases potentially representing pneumonia. There also may be a component of mild pulmonary interstitial edema and bibasilar atelectasis. No significant pleural effusion. No pneumothorax. IMPRESSION: Subtle opacities in the right upper lobe and both lung bases potentially representing multifocal pneumonia. There also may be a component of mild pulmonary interstitial edema and bibasilar atelectasis. Electronically Signed   By: Aletta Edouard M.D.   On: 01/05/2019 08:37   Dg Chest Port 1 View  Result Date: 12/30/2018 CLINICAL DATA:  Progressive shortness of breath. EXAM: PORTABLE CHEST 1 VIEW COMPARISON:  Radiograph yesterday. CT 11/10/2018 FINDINGS: Similar cardiomegaly. Unchanged mediastinal contours. Aortic atherosclerosis with aortic root replacement. Left-sided pacemaker remains in place. Vascular congestion without overt  pulmonary edema. Mild elevation of left hemidiaphragm. Improved atelectasis in the left mid lung. No large pleural effusion. No pneumothorax. Unchanged osseous structures. IMPRESSION: Cardiomegaly with vascular congestion. No overt pulmonary edema. Improved left midlung atelectasis. Aortic Atherosclerosis (ICD10-I70.0). Electronically Signed   By: Keith Rake M.D.   On: 12/30/2018 02:01   Dg Chest Portable 1 View  Result Date: 12/29/2018 CLINICAL DATA:  Weakness with apparent hypotension EXAM: PORTABLE CHEST 1 VIEW COMPARISON:  Chest radiograph November 05, 2018 and chest CT November 10, 2018 FINDINGS: There is atelectatic change in the left mid lung. There is no appreciable edema or consolidation. Heart is mildly enlarged with pulmonary vascularity normal. Pacemaker leads are attached the right atrium and right ventricle. There is aortic atherosclerosis. No adenopathy. No bone lesions. There is calcification in each carotid artery. IMPRESSION: Left midlung atelectasis. No edema or consolidation. Mild cardiomegaly. Pacemaker leads attached to right atrium and right ventricle. Aortic Atherosclerosis (ICD10-I70.0). Bilateral carotid artery calcification noted. Electronically Signed   By: Lowella Grip III M.D.   On: 12/29/2018 21:14   [place] Ekg Site Rite  Result Date: 01/14/2019 If Site Rite image not attached, placement could not be confirmed due to current cardiac rhythm.     ASSESSMENT/PLAN      Acute on chronic hypoxemic respiratory failure -acute decompensated diastolic CHF exacerbation -  -Continue diuresis- now total >15 L negative, will increase diuresis as patient tolerating higher doses of lasix well without decrease in GFR or changes in hemodynamics - will attempt 120 bid today, cxr in am  -Continue strict I's and O's with 1200 cc fluid restriction -Discussed possible LTAC transfer if diuresis is slow due to increased length of stay at this  point   -Bibasilar atelectasis-have reviewed with patient appropriate incentive spirometry and Acapella device usage, encourage use each hour multiple times-discussed with Bambi RT appreciate input.  -Please perform exertional walk to delineate amount of oxygen needed to keep SPO2 over 88%-in preparation to discharge home onincreasedoxygen   - will need outpatient workup for pulmonary hypertension via [facility] - RVSP was significantly elevated on TTE will discuss with cardiology - appreciate input  -appreciate physical therapy input      COPD -continue nebulizer therapy - IS/ Chest PT - no need for steroidsat this time     Thank you for allowing me to participate in the care of this patient.   This document was prepared using Dragon voice recognition software and may include unintentional dictation errors.     Ottie Glazier, M.D.  Division of Pulmonary & [place] -  [place]

## 2019-01-21 ENCOUNTER — Inpatient Hospital Stay: Payer: Medicare HMO

## 2019-01-21 LAB — BASIC METABOLIC PANEL
Anion gap: 9 (ref 5–15)
BUN: 61 mg/dL — ABNORMAL HIGH (ref 8–23)
CO2: 31 mmol/L (ref 22–32)
Calcium: 8.9 mg/dL (ref 8.9–10.3)
Chloride: 102 mmol/L (ref 98–111)
Creatinine, Ser: 1.97 mg/dL — ABNORMAL HIGH (ref 0.61–1.24)
GFR calc Af Amer: 38 mL/min — ABNORMAL LOW (ref >60)
GFR calc non Af Amer: 33 mL/min — ABNORMAL LOW (ref >60)
Glucose, Bld: 95 mg/dL (ref 70–99)
Potassium: 4 mmol/L (ref 3.5–5.1)
Sodium: 142 mmol/L (ref 135–145)

## 2019-01-21 LAB — GLUCOSE, CAPILLARY
Glucose-Capillary: 102 mg/dL — ABNORMAL HIGH (ref 70–99)
Glucose-Capillary: 122 mg/dL — ABNORMAL HIGH (ref 70–99)

## 2019-01-21 MED ORDER — POTASSIUM CHLORIDE ER 10 MEQ PO TBCR: 10.0000 meq | EXTENDED_RELEASE_TABLET | Freq: Every day | ORAL | 0 refills | Status: DC

## 2019-01-21 MED ORDER — TAMSULOSIN HCL 0.4 MG PO CAPS: 0.4000 mg | ORAL_CAPSULE | Freq: Every day | ORAL | 0 refills | Status: DC

## 2019-01-21 MED ORDER — FUROSEMIDE 40 MG PO TABS: 40.0000 mg | ORAL_TABLET | Freq: Two times a day (BID) | ORAL | 0 refills | Status: DC

## 2019-01-21 NOTE — Discharge Summary (Signed)
[facility] [place] NAME: Darius Taylor    MR#:  [id number]  DATE OF BIRTH:  1945/11/11  DATE OF ADMISSION:  12/29/2018 ADMITTING PHYSICIAN: Lance Coon, MD  DATE OF DISCHARGE: 01/21/2019  PRIMARY CARE PHYSICIAN: [facility] [place]    ADMISSION DIAGNOSIS:  Weakness [R53.1] SOB (shortness of breath) [R06.02]  DISCHARGE DIAGNOSIS:  Principal Problem:   Acute on chronic renal failure (HCC) Active Problems:   COPD (chronic obstructive pulmonary disease) (HCC)   Chronic diastolic heart failure (HCC)   HTN (hypertension)   CAD (coronary artery disease)   Alcoholic intoxication without complication ([place])   SECONDARY DIAGNOSIS:   Past Medical History:  Diagnosis Date  . Aortic stenosis   . Arrhythmia    atrial fibrillation  . Asthma   . Atrial fibrillation ([place])   . CAD (coronary artery disease)   . Cancer [facility])    colon, bladder and kidney  . CHF (congestive heart failure) ([place])   . 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 COPD (chronic obstructive pulmonary disease) ([place])   . H/O ventricular tachycardia   . Hypertension   . Nonischemic cardiomyopathy ([place])    Status post AICD and pacemaker  . Renal cell carcinoma [facility])     HOSPITAL COURSE:  73 year old male with a history of PAF, EtOH abuse and COPD who presented to the emergency room due to generalized weakness and found to have elevated creatinine.   1..  Acute on chronic hypoxic respiratory failure: Possible underlying pulmonary hypertension given right ventricular systolic pressure is high and acute on chronic diastolic HF Acute hypoxic respiratory failure with worsening O2 requirements due to  acute on chronic diastolic heart failure underlying pulmonary hypertension. He will need close follow-up with cardiology and possibleoutpatient right heart cath. Healthcare associated pneumonia treated with a IV antibiotics cefepime and vancomycin and completed antibiotic  course He is referred to CHF clinic upon discharge.  He will continue on Lasix 40 mg p.o. twice daily with potassium supplementation.  Patient will continue oxygen. Patient has tested negative for COVID-19.  Respiratory viral panel was also negative. MRSA PCR was negative.    2  Acute on chronic diastolic heart failure: He is -18 L.  His symptoms have improved.  He will have outpatient follow-up with cardiology.  He is referred to CHF clinic upon discharge.  3. 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   4.PAF, history of aortic stenosis status post TAVR: Patient currently in sinus rhythm Continue Eliquis.    5.COPD without signs of exacerbation:   6.Hypertension: He will continue Norvasc   7. Anemia of chronic disease    He is status post transfusion of 1 unit of blood hemoglobin with stable hemoglobin. Stool for occult blood is positive" He is at a high risk given his poor lung and heart condition. He did not want any procedure done. His eliquis has been resumed. Anemia labs vitamin B12 767 ferritin, folate, serum iron levels are normal   8.Dysuria with history of bladder cancer- Status post cystoscopy care at [facility] PSA normal He will continue FLomax   DISCHARGE CONDITIONS AND DIET:  Stable Cardiac diet  CONSULTS OBTAINED:  Treatment Team:  Samara Deist, Claudie Leach, MD  DRUG ALLERGIES:  No Known Allergies  DISCHARGE MEDICATIONS:   Allergies as of 01/21/2019   No Known Allergies     Medication List  STOP taking these medications   acetylcysteine 20 % nebulizer solution Commonly known as:  MUCOMYST   allopurinol 100 MG tablet Commonly known as:  ZYLOPRIM   budesonide-formoterol 80-4.5 MCG/ACT inhaler Commonly known as:  Symbicort   colchicine 0.6 MG tablet   doxycycline 100 MG tablet Commonly known as:  VIBRA-TABS   spironolactone 25 MG  tablet Commonly known as:  ALDACTONE     TAKE these medications   acetaminophen 325 MG tablet Commonly known as:  TYLENOL Take 650 mg by mouth every 6 (six) hours as needed for mild pain or fever.   amiodarone 200 MG tablet Commonly known as:  PACERONE Take 200 mg by mouth daily.   amLODipine 10 MG tablet Commonly known as:  NORVASC Take 10 mg by mouth daily.   aspirin 81 MG chewable tablet Chew 81 mg by mouth daily.   atorvastatin 80 MG tablet Commonly known as:  LIPITOR Take 80 mg by mouth daily.   brimonidine 0.15 % ophthalmic solution Commonly known as:  ALPHAGAN Place 1 drop into both eyes 3 (three) times daily.   cholecalciferol 1000 units tablet Commonly known as:  VITAMIN D Take 1,000 Units by mouth daily.   Eliquis 5 MG Tabs tablet Generic drug:  apixaban Take 5 mg by mouth 2 (two) times daily.   furosemide 40 MG tablet Commonly known as:  Lasix Take 1 tablet (40 mg total) by mouth 2 (two) times daily. What changed:    when to take this  additional instructions   latanoprost 0.005 % ophthalmic solution Commonly known as:  XALATAN Place 1 drop into both eyes at bedtime.   magnesium oxide 400 MG tablet Commonly known as:  MAG-OX Take 400 mg by mouth 2 (two) times daily.   metoprolol 200 MG 24 hr tablet Commonly known as:  TOPROL-XL Take 200 mg by mouth daily.   nitroGLYCERIN 0.4 MG SL tablet Commonly known as:  NITROSTAT Place 0.4 mg under the tongue every 5 (five) minutes as needed for chest pain.   potassium chloride 10 MEQ tablet Commonly known as:  K-DUR Take 1 tablet (10 mEq total) by mouth daily.   tamsulosin 0.4 MG Caps capsule Commonly known as:  FLOMAX Take 1 capsule (0.4 mg total) by mouth daily. Start taking on:  January 22, 2019   tiotropium 18 MCG inhalation capsule Commonly known as:  Spiriva HandiHaler Place 1 capsule (18 mcg total) into inhaler and inhale daily for 30 days.            Durable Medical Equipment  (From  admission, onward)         Start     Ordered   01/21/19 1218  DME Oxygen  Once    Question Answer Comment  Mode or (Route) Nasal cannula   Liters per Minute 4   Frequency Continuous (stationary and portable oxygen unit needed)   Oxygen conserving device Yes   Oxygen delivery system Gas      01/21/19 1218            Today   CHIEF COMPLAINT:  Doing well wants to go home today   VITAL SIGNS:  Blood pressure 126/73, pulse 66, temperature 98.3 F (36.8 C), temperature source Oral, resp. rate 20, height 5\' 11"  (1.803 m), weight 102.3 kg, SpO2 94 %.   REVIEW OF SYSTEMS:  Review of Systems  Constitutional: Negative.  Negative for chills, fever and malaise/fatigue.  HENT: Negative.  Negative for ear discharge, ear pain, hearing loss, nosebleeds  and sore throat.   Eyes: Negative.  Negative for blurred vision and pain.  Respiratory: Negative.  Negative for cough, hemoptysis, shortness of breath and wheezing.   Cardiovascular: Negative.  Negative for chest pain, palpitations and leg swelling.  Gastrointestinal: Negative.  Negative for abdominal pain, blood in stool, diarrhea, nausea and vomiting.  Genitourinary: Negative.  Negative for dysuria.  Musculoskeletal: Negative.  Negative for back pain.  Skin: Negative.   Neurological: Negative for dizziness, tremors, speech change, focal weakness, seizures and headaches.  Endo/Heme/Allergies: Negative.  Does not bruise/bleed easily.  Psychiatric/Behavioral: Negative.  Negative for depression, hallucinations and suicidal ideas.     PHYSICAL EXAMINATION:  GENERAL:  73 y.o.-year-old patient lying in the bed with no acute distress.  NECK:  Supple, no jugular venous distention. No thyroid enlargement, no tenderness.  LUNGS: Normal breath sounds bilaterally, no wheezing, rales,rhonchi  No use of accessory muscles of respiration.  CARDIOVASCULAR: S1, S2 normal. + murmurs,no rubs, or gallops.  ABDOMEN: Soft, non-tender, non-distended.  Bowel sounds present. No organomegaly or mass.  EXTREMITIES: No pedal edema, cyanosis, or clubbing.  PSYCHIATRIC: The patient is alert and oriented x 3.  SKIN: No obvious rash, lesion, or ulcer.   DATA REVIEW:   CBC Recent Labs  Lab 01/19/19 0431  WBC 9.7  HGB 7.9*  HCT 26.0*  PLT 250    Chemistries  Recent Labs  Lab 01/21/19 0837  NA 142  K 4.0  CL 102  CO2 31  GLUCOSE 95  BUN 61*  CREATININE 1.97*  CALCIUM 8.9    Cardiac Enzymes No results for input(s): TROPONINI in the last 168 hours.  Microbiology Results  @MICRORSLT48 @  RADIOLOGY:  Dg Chest Port 1 View  Result Date: 01/21/2019 CLINICAL DATA:  Shortness of breath. EXAM: PORTABLE CHEST 1 VIEW COMPARISON:  Radiograph of January 18, 2019. FINDINGS: Stable cardiomegaly. Atherosclerosis of thoracic aorta is noted. Left-sided pacemaker is unchanged in position. No pneumothorax or pleural effusion is noted. Stable bilateral lung opacities are noted which may represent either edema or pneumonia. Bony thorax is unremarkable. IMPRESSION: Stable bilateral lung opacities as described above. Aortic Atherosclerosis (ICD10-I70.0). Electronically Signed   By: Marijo Conception M.D.   On: 01/21/2019 07:59      Allergies as of 01/21/2019   No Known Allergies     Medication List    STOP taking these medications   acetylcysteine 20 % nebulizer solution Commonly known as:  MUCOMYST   allopurinol 100 MG tablet Commonly known as:  ZYLOPRIM   budesonide-formoterol 80-4.5 MCG/ACT inhaler Commonly known as:  Symbicort   colchicine 0.6 MG tablet   doxycycline 100 MG tablet Commonly known as:  VIBRA-TABS   spironolactone 25 MG tablet Commonly known as:  ALDACTONE     TAKE these medications   acetaminophen 325 MG tablet Commonly known as:  TYLENOL Take 650 mg by mouth every 6 (six) hours as needed for mild pain or fever.   amiodarone 200 MG tablet Commonly known as:  PACERONE Take 200 mg by mouth daily.   amLODipine  10 MG tablet Commonly known as:  NORVASC Take 10 mg by mouth daily.   aspirin 81 MG chewable tablet Chew 81 mg by mouth daily.   atorvastatin 80 MG tablet Commonly known as:  LIPITOR Take 80 mg by mouth daily.   brimonidine 0.15 % ophthalmic solution Commonly known as:  ALPHAGAN Place 1 drop into both eyes 3 (three) times daily.   cholecalciferol 1000 units tablet Commonly known as:  VITAMIN D Take 1,000 Units by mouth daily.   Eliquis 5 MG Tabs tablet Generic drug:  apixaban Take 5 mg by mouth 2 (two) times daily.   furosemide 40 MG tablet Commonly known as:  Lasix Take 1 tablet (40 mg total) by mouth 2 (two) times daily. What changed:    when to take this  additional instructions   latanoprost 0.005 % ophthalmic solution Commonly known as:  XALATAN Place 1 drop into both eyes at bedtime.   magnesium oxide 400 MG tablet Commonly known as:  MAG-OX Take 400 mg by mouth 2 (two) times daily.   metoprolol 200 MG 24 hr tablet Commonly known as:  TOPROL-XL Take 200 mg by mouth daily.   nitroGLYCERIN 0.4 MG SL tablet Commonly known as:  NITROSTAT Place 0.4 mg under the tongue every 5 (five) minutes as needed for chest pain.   potassium chloride 10 MEQ tablet Commonly known as:  K-DUR Take 1 tablet (10 mEq total) by mouth daily.   tamsulosin 0.4 MG Caps capsule Commonly known as:  FLOMAX Take 1 capsule (0.4 mg total) by mouth daily. Start taking on:  January 22, 2019   tiotropium 18 MCG inhalation capsule Commonly known as:  Spiriva HandiHaler Place 1 capsule (18 mcg total) into inhaler and inhale daily for 30 days.            Durable Medical Equipment  (From admission, onward)         Start     Ordered   01/21/19 1218  DME Oxygen  Once    Question Answer Comment  Mode or (Route) Nasal cannula   Liters per Minute 4   Frequency Continuous (stationary and portable oxygen unit needed)   Oxygen conserving device Yes   Oxygen delivery system Gas       01/21/19 1218            Management plans discussed with the patient and he is in agreement. Stable [facility] discharge [facility] with [facility]  Patient should follow up with cardiology  CODE STATUS:     Code Status Orders  (From admission, onward)         Start     Ordered   12/30/18 0224  Full code  Continuous     12/30/18 0224        Code Status History    Date Active Date Inactive Code Status Order ID Comments User Context   11/03/2018 2032 11/12/2018 2045 Full Code [id number]  Gladstone Lighter, MD Inpatient   08/30/2018 1045 09/02/2018 1616 Full Code [id number]  Fritzi Mandes, MD Inpatient   08/22/2018 1435 08/25/2018 2257 Full Code [id number]  Fritzi Mandes, MD Inpatient   01/08/2018 1523 01/11/2018 1712 Full Code [id number]  Nicholes Mango, MD ED   11/18/2017 2227 11/23/2017 2033 Full Code [id number]  Vaughan Basta, MD Inpatient   10/21/2016 1216 10/26/2016 2247 Full Code [id number]  Bettey Costa, MD Inpatient      TOTAL TIME TAKING CARE OF THIS PATIENT: 40 minutes.    Note: This dictation was prepared with Dragon dictation along with smaller phrase technology. Any transcriptional errors that result from this process are unintentional.  Bettey Costa M.D on 01/21/2019 at 12:20 PM  Between 7am to 6pm - Pager - [phone] After 6pm go to [email or web address] - password [email or web address] [facility]  Office  [phone]  CC: Primary care physician; [employer]

## 2019-01-21 NOTE — TOC Progression Note (Signed)
Transition of Care [facility]) - Progression Note    Patient Details  Name: HAEDYN ANCRUM MRN: [id number] Date of Birth: 12-07-1945  [facility]) CM/SW Contact  Ross Ludwig, [place] Phone Number: 01/21/2019, 9:17 AM  Clinical Narrative:     PT still recommending home with home health, patient would like to return back home with home health.  CSW continuing to follow patient's progress throughout discharge planning.  Expected Discharge Plan: [place] Barriers to Discharge: Continued Medical Work up  Expected Discharge Plan and Services Expected Discharge Plan: [place]   Discharge Planning Services: CM Consult Post Acute Care Choice: [place] arrangements for the past 2 months: Single Family Home                           HH Arranged: RN, PT, Nurse's Aide, Social Work [employer] Agency: [employer] [facility]now [facility] Interventions    Readmission Risk Interventions Readmission Risk Prevention Plan 01/05/2019  Transportation Screening Complete  Medication Review Press photographer) Complete  PCP or Specialist appointment within 3-5 days of discharge Complete  HRI or [place] Complete  SW Recovery Care/Counseling Consult Patient refused  Palliative Care Screening Not [place] Not Applicable  Some recent data might be hidden

## 2019-01-21 NOTE — Progress Notes (Signed)
Discharge instructions explained to pt/ verbalized an understanding/ iv and tele removed/ will transport  Off unit via wheelchair with 02 tank

## 2019-01-21 NOTE — TOC Transition Note (Signed)
Transition of Care [name]) - CM/SW Discharge Note   Patient Details  Name: Darius Taylor MRN: [id number] Date of Birth: Mar 15, 1946  [facility] of [facility]) CM/SW Contact:  Shela Leff, LCSW Phone Number: 01/21/2019, 3:02 PM   Clinical Narrative:   Patient discharging today and informed CSW that he needed a portable tank to return home with . He stated that [employer] supplies his home oxygen. CSW contacted Brad with [employer] and he was able to supply a portable tank. CSW also contacted Helene Kelp with [facility] to notify of need to resume home health services.     Final next level of care: Home w Home Health Services Barriers to Discharge: No Barriers Identified   Patient Goals and CMS Choice Patient states their goals for this hospitalization and ongoing recovery are:: daughter states to return home with current home health services.   CMS Medicare.gov Compare Post Acute Care list provided to:: Patient Represenative (must comment)( [name],[name] Daughter [phone] ) Choice offered to / list presented to : Adult Children  Discharge Placement                       Discharge Plan and Services   Discharge Planning Services: CM Consult Post Acute Care Choice: Home Health                    HH Arranged: RN, PT, Nurse's Aide [place] Agency: [facility] [employer] Date [place]: 01/21/19   Representative spoke with at [place]: [place] ([place]) Interventions     Readmission Risk Interventions Readmission Risk Prevention Plan 01/05/2019  Transportation Screening Complete  Medication Review Press photographer) Complete  PCP or Specialist appointment within 3-5 days of discharge Complete  HRI or [place] Complete  SW Recovery Care/Counseling Consult Patient refused  Palliative Care Screening Not [place] Not Applicable  Some recent data might be hidden

## 2019-01-21 NOTE — Progress Notes (Signed)
SLP Cancellation Note  Patient Details Name: Darius Taylor MRN: [id number] DOB: November 21, 1945   Cancelled treatment:       Reason Eval/Treat Not Completed: (chart reviewed; consulted NSG re: pt's status) Per MD and NSG reports, pt continued to tolerate an oral diet w/ no overt s/s of aspiration noted. MD reported improvement in pulmonary status; CXR today was stable.  ST services has provided education on general aspiration precautions during admission; will sign off at this time w/ NSG/MD to reconsult if indicated while admitted.     Orinda Kenner, MS, CCC-SLP Watson,Katherine 01/21/2019, 11:23 AM

## 2019-01-21 NOTE — Plan of Care (Signed)
  Problem: Activity: Goal: Risk for activity intolerance will decrease Outcome: Progressing   Problem: Nutrition: Goal: Adequate nutrition will be maintained Outcome: Progressing   Problem: Coping: Goal: Level of anxiety will decrease Outcome: Progressing   Problem: Elimination: Goal: Will not experience complications related to urinary retention Outcome: Progressing   Problem: Pain Managment: Goal: General experience of comfort will improve Outcome: Progressing Note:  No complaints of pain this shift   Problem: Safety: Goal: Ability to remain free from injury will improve Outcome: Progressing   Problem: Skin Integrity: Goal: Risk for impaired skin integrity will decrease Outcome: Progressing

## 2019-01-21 NOTE — Consult Note (Signed)
Cardiology consult note Patient was seen by me for atrial fibrillation now converted to normal sinus rhythm Patient has significant underlying lung disease with mets smoker possible obstructive sleep apnea as well Patient currently rate controlled Would recommend long-term anticoagulation with Eliquis 5 mg twice a day Also consider rate control either with metoprolol or Cardizem While he is on Eliquis would consider discontinuing aspirin Have patient follow-up with cardiology as an outpatient 1 to 2 weeks

## 2019-01-21 NOTE — Progress Notes (Signed)
Pulmonary Medicine          Date: 01/21/2019,   MRN# [id number] Darius Taylor 06-29-1946     AdmissionWeight: 99.8 kg                 CurrentWeight: 102.3 kg         SUBJECTIVE   Patient sitting up in chair eating breakfast.  Reports clinical improvement states he is excited to go home and drive his car again.  NET NEGATIVE - >17 LITERS   PAST MEDICAL HISTORY   Past Medical History:  Diagnosis Date   Aortic stenosis    Arrhythmia    atrial fibrillation   Asthma    Atrial fibrillation (HCC)    CAD (coronary artery disease)    Cancer (HCC)    colon, bladder and kidney   CHF (congestive heart failure) (HCC)    Colon adenocarcinoma (HCC)    COPD (chronic obstructive pulmonary disease) ([place])    H/O ventricular tachycardia    Hypertension    Nonischemic cardiomyopathy ([place])    Status post AICD and pacemaker   Renal cell carcinoma ([place])      SURGICAL HISTORY   Past Surgical History:  Procedure Laterality Date   CARDIAC DEFIBRILLATOR PLACEMENT     CARDIAC DEFIBRILLATOR PLACEMENT     COLONOSCOPY WITH PROPOFOL N/A 06/10/2018   Procedure: COLONOSCOPY WITH PROPOFOL;  Surgeon: Lin Landsman, MD;  Location: [facility];  Service: Gastroenterology;  Laterality: N/A;   CORONARY ANGIOPLASTY  09/25/2016   IRRIGATION AND DEBRIDEMENT HEMATOMA Left 10/24/2016   Procedure: IRRIGATION AND DEBRIDEMENT HEMATOMA;  Surgeon: Florene Glen, MD;  Location: [facility] ORS;  Service: General;  Laterality: Left;   KIDNEY SURGERY     left ne[hrectomy for RCC   PACEMAKER IMPLANT     PARTIAL COLECTOMY       FAMILY HISTORY   Family History  Problem Relation Age of Onset   Hypertension Mother    Stroke Father      SOCIAL HISTORY   Social History   Tobacco Use   Smoking status: Former Smoker   Smokeless tobacco: Never Used  Substance Use Topics   Alcohol use: No   Drug use: No     MEDICATIONS    Home Medication:      Current Medication:  Current Facility-Administered Medications:    0.9 %  sodium chloride infusion (Manually program via Guardrails IV Fluids), , Intravenous, Once, Gouru, Aruna, MD   0.9 %  sodium chloride infusion, , Intravenous, PRN, Gouru, Aruna, MD, Stopped at 01/11/19 0047   acetaminophen (TYLENOL) tablet 650 mg, 650 mg, Oral, Q6H PRN, 650 mg at 01/19/19 2030 **OR** acetaminophen (TYLENOL) suppository 650 mg, 650 mg, Rectal, Q6H PRN, Lance Coon, MD   albuterol (PROVENTIL) (2.5 MG/3ML) 0.083% nebulizer solution 2.5 mg, 2.5 mg, Nebulization, Q2H PRN, Fritzi Mandes, MD   amiodarone (PACERONE) tablet 200 mg, 200 mg, Oral, Daily, Gouru, Aruna, MD, 200 mg at 01/21/19 0846   amLODipine (NORVASC) tablet 10 mg, 10 mg, Oral, Daily, Lance Coon, MD, 10 mg at 01/21/19 0847   apixaban (ELIQUIS) tablet 5 mg, 5 mg, Oral, BID, Cyndee Brightly M, RPH, 5 mg at 01/21/19 4034   aspirin chewable tablet 81 mg, 81 mg, Oral, Daily, Lance Coon, MD, 81 mg at 01/21/19 0846   brimonidine (ALPHAGAN) 0.15 % ophthalmic solution 1 drop, 1 drop, Both Eyes, TID, Fritzi Mandes, MD, 1 drop at 01/21/19 0847   docusate sodium (COLACE) capsule 100 mg,  100 mg, Oral, Daily, Fritzi Mandes, MD, 100 mg at 01/20/19 6962   feeding supplement (NEPRO CARB STEADY) liquid 237 mL, 237 mL, Oral, Q24H, Gouru, Aruna, MD, 237 mL at 95/28/41 3244   folic acid (FOLVITE) tablet 1 mg, 1 mg, Oral, Daily, Lance Coon, MD, 1 mg at 01/21/19 0846   furosemide (LASIX) 120 mg in dextrose 5 % 50 mL IVPB, 120 mg, Intravenous, BID, Dallan Schonberg, MD, Last Rate: 62 mL/hr at 01/21/19 0848, 120 mg at 01/21/19 0848   guaiFENesin-dextromethorphan (ROBITUSSIN DM) 100-10 MG/5ML syrup 5 mL, 5 mL, Oral, Q4H PRN, Gouru, Aruna, MD, 5 mL at 01/02/19 0618   insulin aspart (novoLOG) injection 0-15 Units, 0-15 Units, Subcutaneous, TID WC, Wilhelmina Mcardle, MD, 2 Units at 01/19/19 1720   insulin aspart (novoLOG) injection 0-5 Units, 0-5 Units,  Subcutaneous, QHS, Wilhelmina Mcardle, MD, 2 Units at 01/11/19 2133   ipratropium-albuterol (DUONEB) 0.5-2.5 (3) MG/3ML nebulizer solution 3 mL, 3 mL, Nebulization, TID, Fritzi Mandes, MD, 3 mL at 01/21/19 0819   latanoprost (XALATAN) 0.005 % ophthalmic solution 1 drop, 1 drop, Both Eyes, QHS, Patel, Gus Height, MD, 1 drop at 01/20/19 2125   magnesium oxide (MAG-OX) tablet 400 mg, 400 mg, Oral, BID, Lance Coon, MD, 400 mg at 01/21/19 0102   MEDLINE mouth rinse, 15 mL, Mouth Rinse, BID, Wilhelmina Mcardle, MD, 15 mL at 01/20/19 2127   metoprolol succinate (TOPROL-XL) 24 hr tablet 100 mg, 100 mg, Oral, Daily, Lanney Gins, Mareo Portilla, MD, 100 mg at 01/21/19 0846   multivitamin with minerals tablet 1 tablet, 1 tablet, Oral, Daily, Lance Coon, MD, 1 tablet at 01/21/19 0846   potassium chloride 20 MEQ/15ML (10%) solution 20 mEq, 20 mEq, Oral, Daily, Mody, Sital, MD, 20 mEq at 01/21/19 0846   sodium chloride flush (NS) 0.9 % injection 10 mL, 10 mL, Intravenous, Q12H, Gouru, Aruna, MD, 10 mL at 01/20/19 2128   tamsulosin (FLOMAX) capsule 0.4 mg, 0.4 mg, Oral, Daily, Gouru, Aruna, MD, 0.4 mg at 01/21/19 0846   thiamine (VITAMIN B-1) tablet 100 mg, 100 mg, Oral, Daily, 100 mg at 01/21/19 0846 **OR** [DISCONTINUED] thiamine (B-1) injection 100 mg, 100 mg, Intravenous, Daily, Lance Coon, MD, 100 mg at 01/04/19 0908    ALLERGIES   Patient has no known allergies.     REVIEW OF SYSTEMS    Review of Systems:  Gen:  Denies  fever, sweats, chills weigh loss  HEENT: Denies blurred vision, double vision, ear pain, eye pain, hearing loss, nose bleeds, sore throat Cardiac:  No dizziness, chest pain or heaviness, chest tightness,edema Resp:   Denies cough or sputum porduction, shortness of breath,wheezing, hemoptysis,  Gi: Denies swallowing difficulty, stomach pain, nausea or vomiting, diarrhea, constipation, bowel incontinence Gu:  Denies bladder incontinence, burning urine Ext:   Denies Joint pain,  stiffness or swelling Skin: Denies  skin rash, easy bruising or bleeding or hives Endoc:  Denies polyuria, polydipsia , polyphagia or weight change Psych:   Denies depression, insomnia or hallucinations   Other:  All other systems negative   VS: BP 126/73 (BP Location: Left Arm)    Pulse 66    Temp 98.3 F (36.8 C) (Oral)    Resp 20    Ht 5\' 11"  (1.803 m)    Wt 102.3 kg    SpO2 94%    BMI 31.46 kg/m      PHYSICAL EXAM    GENERAL:NAD, no fevers, chills, no weakness no fatigue HEAD: Normocephalic, atraumatic.  EYES: Pupils equal, round,  reactive to light. Extraocular muscles intact. No scleral icterus.  MOUTH: Moist mucosal membrane. Dentition intact. No abscess noted.  EAR, NOSE, THROAT: Clear without exudates. No external lesions.  NECK: Supple. No thyromegaly. No nodules. No JVD.  PULMONARY: Decreased breath sounds bilaterally with crackles at the bases on both sides CARDIOVASCULAR: S1 and S2. Regular rate and rhythm. No murmurs, rubs, or gallops. No edema. Pedal pulses 2+ bilaterally.  GASTROINTESTINAL: Soft, nontender, nondistended. No masses. Positive bowel sounds. No hepatosplenomegaly.  MUSCULOSKELETAL: No swelling, clubbing, or edema. Range of motion full in all extremities.  NEUROLOGIC: Cranial nerves II through XII are intact. No gross focal neurological deficits. Sensation intact. Reflexes intact.  SKIN: No ulceration, lesions, rashes, or cyanosis. Skin warm and dry. Turgor intact.  PSYCHIATRIC: Mood, affect within normal limits. The patient is awake, alert and oriented x 3. Insight, judgment intact.       IMAGING    Dg Chest 1 View  Result Date: 01/10/2019 CLINICAL DATA:  Pneumonia. Hx of asthma, A-fib, CAD CHF, COPD, HTN. EXAM: CHEST  1 VIEW COMPARISON:  01/09/19 FINDINGS: LEFT-sided pacemaker overlies stable enlarged cardiac silhouette. The diffuse lobar airspace disease in the RIGHT upper lobe. Mild airspace disease in the RIGHT lower lobe. No focal consolidation.  No pneumothorax. Chronic elevation LEFT hemidiaphragm. IMPRESSION: 1. No significant interval change. 2. RIGHT lung asymmetric airspace disease representing asymmetric edema versus asymmetric pulmonary infection. Electronically Signed   By: Suzy Bouchard M.D.   On: 01/10/2019 07:50   Dg Chest 1 View  Result Date: 12/31/2018 CLINICAL DATA:  SOB. EXAM: CHEST  1 VIEW COMPARISON:  12/30/2018. FINDINGS: Marked cardiac enlargement. Dual lead pacer/AICD. Mild vascular congestion. No overt edema. Chronic elevation LEFT hemidiaphragm. No pneumothorax. Aortic atherosclerosis. IMPRESSION: Cardiomegaly. Mild vascular congestion without overt edema. Stable appearance from priors. Electronically Signed   By: Staci Righter M.D.   On: 12/31/2018 08:20   Dg Abd 1 View  Result Date: 01/05/2019 CLINICAL DATA:  Nausea and vomiting EXAM: ABDOMEN - 1 VIEW COMPARISON:  09/28/2016 FINDINGS: There is a large amount of stool in the ascending and transverse colon. The bowel gas pattern is normal. No radio-opaque calculi or other significant radiographic abnormality are seen. Mild osteoarthritis of the right hip. IMPRESSION: Large amount of stool in the ascending and transverse colon. Electronically Signed   By: Kathreen Devoid   On: 01/05/2019 19:42   Ct Chest Wo Contrast  Result Date: 01/02/2019 CLINICAL DATA:  Dyspnea.  History of colon cancer. EXAM: CT CHEST WITHOUT CONTRAST TECHNIQUE: Multidetector CT imaging of the chest was performed following the standard protocol without IV contrast. COMPARISON:  Radiographs of December 31, 2018. CT scan of November 10, 2018. FINDINGS: Cardiovascular: Atherosclerosis of thoracic aorta is noted without aneurysm formation. Mild cardiomegaly is noted. Status post transcatheter aortic valve replacement. Coronary artery calcifications are noted. Mediastinum/Nodes: No enlarged mediastinal or axillary lymph nodes. Thyroid gland, trachea, and esophagus demonstrate no significant findings. Lungs/Pleura:  No pneumothorax or pleural effusion is noted. Moderate left lower lobe subsegmental atelectasis or pneumonia is noted which is increased compared to prior exam. Mild right posterior basilar subsegmental atelectasis or inflammation is noted posteriorly in right lower lobe. New ground-glass opacity seen posteriorly in the right upper lobe concerning for pneumonia. Upper Abdomen: No acute abnormality. Musculoskeletal: No chest wall mass or suspicious bone lesions identified. IMPRESSION: New ground-glass opacity seen posteriorly in right upper lobe concerning for pneumonia; this may represent typical or atypical infection, including viral etiology. Also noted are increased  bilateral lower lobe opacities concerning for worsening atelectasis or pneumonia, left worse than right. Coronary artery calcifications are noted. Aortic Atherosclerosis (ICD10-I70.0). Electronically Signed   By: Marijo Conception, M.D.   On: 01/02/2019 16:18   Dg Chest Port 1 View  Result Date: 01/21/2019 CLINICAL DATA:  Shortness of breath. EXAM: PORTABLE CHEST 1 VIEW COMPARISON:  Radiograph of January 18, 2019. FINDINGS: Stable cardiomegaly. Atherosclerosis of thoracic aorta is noted. Left-sided pacemaker is unchanged in position. No pneumothorax or pleural effusion is noted. Stable bilateral lung opacities are noted which may represent either edema or pneumonia. Bony thorax is unremarkable. IMPRESSION: Stable bilateral lung opacities as described above. Aortic Atherosclerosis (ICD10-I70.0). Electronically Signed   By: Marijo Conception M.D.   On: 01/21/2019 07:59   Dg Chest Port 1 View  Result Date: 01/18/2019 CLINICAL DATA:  Continued shortness of breath since admission. Denies chest pain. EXAM: PORTABLE CHEST 1 VIEW COMPARISON:  01/15/2019. FINDINGS: Massive cardiomegaly. Dual lead pacer/AICD, stable. BILATERAL pulmonary opacities persist, but are slightly improved. These are mildly consolidative in the RIGHT upper lobe. These opacities could  represent a combination of edema and infection. Continued surveillance is warranted. IMPRESSION: Slight improvement aeration. Electronically Signed   By: Staci Righter M.D.   On: 01/18/2019 07:26   Dg Chest Port 1 View  Result Date: 01/15/2019 CLINICAL DATA:  73 year old male with shortness of breath. Negative for COVID-19 on 01/03/2019. EXAM: PORTABLE CHEST 1 VIEW COMPARISON:  01/10/2019 and earlier. FINDINGS: Portable AP upright view at 0342 hours. Stable cardiomegaly and mediastinal contours. Prior TAVR. Calcified aortic atherosclerosis. Stable left chest AICD. Coarse bilateral pulmonary interstitial opacity is confluent in the right upper lobe and at the left lung base. Ventilation has worsened since 01/08/2019, and the right upper lobe opacity appears mildly progressed since yesterday. No superimposed pneumothorax. Small left pleural effusion is difficult to exclude. IMPRESSION: 1. Coarse bilateral pulmonary opacity with mild progression in the right upper lobe since yesterday. 2. Stable cardiomegaly. Aortic Atherosclerosis (ICD10-I70.0). Electronically Signed   By: Genevie Ann M.D.   On: 01/15/2019 06:00   Dg Chest Port 1 View  Result Date: 01/09/2019 CLINICAL DATA:  Acute respiratory failure. EXAM: PORTABLE CHEST 1 VIEW COMPARISON:  Radiographs 01/08/2019 and 01/06/2019.  CT 01/02/2019. FINDINGS: 0937 hours. The left subclavian pacemaker leads appear stable. There is stable cardiomegaly and aortic atherosclerosis post TAVR. They were stable asymmetric airspace opacities in the right upper lobe and left lung base. No pneumothorax or significant pleural effusion. The bones appear unchanged. IMPRESSION: No significant change in asymmetric bilateral airspace opacities, presumably infectious/inflammatory. Stable cardiomegaly. Electronically Signed   By: Richardean Sale M.D.   On: 01/09/2019 10:24   Dg Chest Port 1 View  Result Date: 01/08/2019 CLINICAL DATA:  Respiratory failure. EXAM: PORTABLE CHEST 1  VIEW COMPARISON:  One-view chest x-ray 01/06/2019 FINDINGS: AND: FINDINGS: AND Heart is enlarged. Aortic valve replacement is noted. Atherosclerotic changes are present in the aorta. Pacing and defibrillator wires are stable. There is improving aeration of both lungs. Right upper lobe and left lower lobe airspace consolidation remain. Mild pulmonary vascular congestion is noted. IMPRESSION: 1. Slight improved aeration of both lungs with persistent right upper lobe and left lower lobe pneumonia. 2. Cardiomegaly with mild respiratory changes. Electronically Signed   By: San Morelle M.D.   On: 01/08/2019 06:40   Dg Chest Port 1 View  Result Date: 01/06/2019 CLINICAL DATA:  Respiratory failure. EXAM: PORTABLE CHEST 1 VIEW COMPARISON:  Chest x-ray from yesterday.  FINDINGS: Unchanged left chest wall AICD. Stable cardiomegaly status post TAVR. Atherosclerotic calcification of the aortic arch. Unchanged pulmonary vascular congestion and interstitial thickening. Increased patchy opacity in the right upper lobe. Unchanged left greater than right bibasilar atelectasis. No large pleural effusion. No pneumothorax. No acute osseous abnormality. IMPRESSION: 1. Increasing patchy opacity in the right upper lobe, concerning for pneumonia. 2. Unchanged mild pulmonary interstitial edema and bibasilar atelectasis. Electronically Signed   By: Titus Dubin M.D.   On: 01/06/2019 11:14   Dg Chest Port 1 View  Result Date: 01/05/2019 CLINICAL DATA:  Respiratory failure. EXAM: PORTABLE CHEST 1 VIEW COMPARISON:  CT of the chest on 01/02/2019 and chest x-ray on 12/31/2018 FINDINGS: Stable cardiac enlargement, appearance of pacing/ICD device and appearance transcatheter aortic valve. Lungs show subtle opacities in the right upper lobe and both lung bases potentially representing pneumonia. There also may be a component of mild pulmonary interstitial edema and bibasilar atelectasis. No significant pleural effusion. No  pneumothorax. IMPRESSION: Subtle opacities in the right upper lobe and both lung bases potentially representing multifocal pneumonia. There also may be a component of mild pulmonary interstitial edema and bibasilar atelectasis. Electronically Signed   By: Aletta Edouard M.D.   On: 01/05/2019 08:37   Dg Chest Port 1 View  Result Date: 12/30/2018 CLINICAL DATA:  Progressive shortness of breath. EXAM: PORTABLE CHEST 1 VIEW COMPARISON:  Radiograph yesterday. CT 11/10/2018 FINDINGS: Similar cardiomegaly. Unchanged mediastinal contours. Aortic atherosclerosis with aortic root replacement. Left-sided pacemaker remains in place. Vascular congestion without overt pulmonary edema. Mild elevation of left hemidiaphragm. Improved atelectasis in the left mid lung. No large pleural effusion. No pneumothorax. Unchanged osseous structures. IMPRESSION: Cardiomegaly with vascular congestion. No overt pulmonary edema. Improved left midlung atelectasis. Aortic Atherosclerosis (ICD10-I70.0). Electronically Signed   By: Keith Rake M.D.   On: 12/30/2018 02:01   Dg Chest Portable 1 View  Result Date: 12/29/2018 CLINICAL DATA:  Weakness with apparent hypotension EXAM: PORTABLE CHEST 1 VIEW COMPARISON:  Chest radiograph November 05, 2018 and chest CT November 10, 2018 FINDINGS: There is atelectatic change in the left mid lung. There is no appreciable edema or consolidation. Heart is mildly enlarged with pulmonary vascularity normal. Pacemaker leads are attached the right atrium and right ventricle. There is aortic atherosclerosis. No adenopathy. No bone lesions. There is calcification in each carotid artery. IMPRESSION: Left midlung atelectasis. No edema or consolidation. Mild cardiomegaly. Pacemaker leads attached to right atrium and right ventricle. Aortic Atherosclerosis (ICD10-I70.0). Bilateral carotid artery calcification noted. Electronically Signed   By: Lowella Grip III M.D.   On: 12/29/2018 21:14   [place] Ekg Site  Rite  Result Date: 01/14/2019 If Site Rite image not attached, placement could not be confirmed due to current cardiac rhythm.     ASSESSMENT/PLAN    Acute on chronic hypoxemic respiratory failure -acute decompensated diastolic CHF exacerbation -  -Continue diuresis- now total >17L negative -Continue strict I's and O's with 1200 cc fluid restriction      - mild AKI - will decrease diuretic  -Discussed possible LTAC transfer if diuresis is slow due to increased length of stay at this point   -Bibasilar atelectasis-have reviewed with patient appropriate incentive spirometry and Acapella device usage, encourage use each hour multiple times-discussed with Bambi RT appreciate input.  -Please perform exertional walk to delineate amount of oxygen needed to keep SPO2 over 88%-in preparation to discharge home onincreasedoxygen   - will need outpatient workup for pulmonary hypertension via [facility] - RVSP was  significantly elevated on TTE will discuss with cardiology - appreciate input  -appreciate physical therapy input      COPD -continue nebulizer therapy - IS/ Chest PT - no need for steroidsat this time    Thank you for allowing me to participate in the care of this patient.    Patient/Family are satisfied with care plan and all questions have been answered.  This document was prepared using Dragon voice recognition software and may include unintentional dictation errors.     Ottie Glazier, M.D.  Division of [place]

## 2019-01-25 ENCOUNTER — Encounter: Payer: Self-pay | Admitting: Emergency Medicine

## 2019-01-25 ENCOUNTER — Emergency Department: Payer: Medicare HMO

## 2019-01-25 ENCOUNTER — Inpatient Hospital Stay
Admission: EM | Admit: 2019-01-25 | Discharge: 2019-01-30 | DRG: 291 | Disposition: A | Discharge status: Home Health | Payer: Medicare HMO | Attending: Specialist | Admitting: Specialist

## 2019-01-25 ENCOUNTER — Other Ambulatory Visit: Payer: Self-pay

## 2019-01-25 DIAGNOSIS — Z79899 Other long term (current) drug therapy: Secondary | ICD-10-CM | POA: Diagnosis not present

## 2019-01-25 DIAGNOSIS — Z6829 Body mass index (BMI) 29.0-29.9, adult: Secondary | ICD-10-CM

## 2019-01-25 DIAGNOSIS — Z7901 Long term (current) use of anticoagulants: Secondary | ICD-10-CM

## 2019-01-25 DIAGNOSIS — Z7982 Long term (current) use of aspirin: Secondary | ICD-10-CM

## 2019-01-25 DIAGNOSIS — I480 Paroxysmal atrial fibrillation: Secondary | ICD-10-CM | POA: Diagnosis present

## 2019-01-25 DIAGNOSIS — J449 Chronic obstructive pulmonary disease, unspecified: Secondary | ICD-10-CM | POA: Diagnosis present

## 2019-01-25 DIAGNOSIS — D649 Anemia, unspecified: Secondary | ICD-10-CM | POA: Diagnosis present

## 2019-01-25 DIAGNOSIS — H409 Unspecified glaucoma: Secondary | ICD-10-CM | POA: Diagnosis present

## 2019-01-25 DIAGNOSIS — I2510 Atherosclerotic heart disease of native coronary artery without angina pectoris: Secondary | ICD-10-CM | POA: Diagnosis present

## 2019-01-25 DIAGNOSIS — Z9049 Acquired absence of other specified parts of digestive tract: Secondary | ICD-10-CM | POA: Diagnosis not present

## 2019-01-25 DIAGNOSIS — I130 Hypertensive heart and chronic kidney disease with heart failure and stage 1 through stage 4 chronic kidney disease, or unspecified chronic kidney disease: Principal | ICD-10-CM | POA: Diagnosis present

## 2019-01-25 DIAGNOSIS — I350 Nonrheumatic aortic (valve) stenosis: Secondary | ICD-10-CM | POA: Diagnosis present

## 2019-01-25 DIAGNOSIS — Z1159 Encounter for screening for other viral diseases: Secondary | ICD-10-CM

## 2019-01-25 DIAGNOSIS — J9611 Chronic respiratory failure with hypoxia: Secondary | ICD-10-CM | POA: Diagnosis present

## 2019-01-25 DIAGNOSIS — E669 Obesity, unspecified: Secondary | ICD-10-CM | POA: Diagnosis present

## 2019-01-25 DIAGNOSIS — Z95810 Presence of automatic (implantable) cardiac defibrillator: Secondary | ICD-10-CM

## 2019-01-25 DIAGNOSIS — Z87891 Personal history of nicotine dependence: Secondary | ICD-10-CM | POA: Diagnosis not present

## 2019-01-25 DIAGNOSIS — N183 Chronic kidney disease, stage 3 (moderate): Secondary | ICD-10-CM | POA: Diagnosis present

## 2019-01-25 DIAGNOSIS — Z85528 Personal history of other malignant neoplasm of kidney: Secondary | ICD-10-CM

## 2019-01-25 DIAGNOSIS — Z9981 Dependence on supplemental oxygen: Secondary | ICD-10-CM

## 2019-01-25 DIAGNOSIS — I428 Other cardiomyopathies: Secondary | ICD-10-CM | POA: Diagnosis present

## 2019-01-25 DIAGNOSIS — Z85038 Personal history of other malignant neoplasm of large intestine: Secondary | ICD-10-CM

## 2019-01-25 DIAGNOSIS — I509 Heart failure, unspecified: Secondary | ICD-10-CM

## 2019-01-25 DIAGNOSIS — R05 Cough: Secondary | ICD-10-CM

## 2019-01-25 DIAGNOSIS — R059 Cough, unspecified: Secondary | ICD-10-CM

## 2019-01-25 DIAGNOSIS — I5033 Acute on chronic diastolic (congestive) heart failure: Secondary | ICD-10-CM | POA: Diagnosis present

## 2019-01-25 DIAGNOSIS — R0602 Shortness of breath: Secondary | ICD-10-CM | POA: Diagnosis present

## 2019-01-25 LAB — CBC WITH DIFFERENTIAL/PLATELET
Abs Immature Granulocytes: 0.02 10*3/uL (ref 0.00–0.07)
Basophils Absolute: 0 10*3/uL (ref 0.0–0.1)
Basophils Relative: 0 %
Eosinophils Absolute: 0.2 10*3/uL (ref 0.0–0.5)
Eosinophils Relative: 3 %
HCT: 25.2 % — ABNORMAL LOW (ref 39.0–52.0)
Hemoglobin: 7.5 g/dL — ABNORMAL LOW (ref 13.0–17.0)
Immature Granulocytes: 0 %
Lymphocytes Relative: 11 %
Lymphs Abs: 0.8 10*3/uL (ref 0.7–4.0)
MCH: 29.5 pg (ref 26.0–34.0)
MCHC: 29.8 g/dL — ABNORMAL LOW (ref 30.0–36.0)
MCV: 99.2 fL (ref 80.0–100.0)
Monocytes Absolute: 1 10*3/uL (ref 0.1–1.0)
Monocytes Relative: 14 %
Neutro Abs: 5 10*3/uL (ref 1.7–7.7)
Neutrophils Relative %: 72 %
Platelets: 212 10*3/uL (ref 150–400)
RBC: 2.54 MIL/uL — ABNORMAL LOW (ref 4.22–5.81)
RDW: 16.5 % — ABNORMAL HIGH (ref 11.5–15.5)
WBC: 7 10*3/uL (ref 4.0–10.5)
nRBC: 0 % (ref 0.0–0.2)

## 2019-01-25 LAB — BASIC METABOLIC PANEL
Anion gap: 9 (ref 5–15)
BUN: 43 mg/dL — ABNORMAL HIGH (ref 8–23)
CO2: 29 mmol/L (ref 22–32)
Calcium: 8.7 mg/dL — ABNORMAL LOW (ref 8.9–10.3)
Chloride: 105 mmol/L (ref 98–111)
Creatinine, Ser: 1.85 mg/dL — ABNORMAL HIGH (ref 0.61–1.24)
GFR calc Af Amer: 41 mL/min — ABNORMAL LOW (ref >60)
GFR calc non Af Amer: 35 mL/min — ABNORMAL LOW (ref >60)
Glucose, Bld: 110 mg/dL — ABNORMAL HIGH (ref 70–99)
Potassium: 3.8 mmol/L (ref 3.5–5.1)
Sodium: 143 mmol/L (ref 135–145)

## 2019-01-25 LAB — BRAIN NATRIURETIC PEPTIDE: B Natriuretic Peptide: 419 pg/mL — ABNORMAL HIGH (ref 0.0–100.0)

## 2019-01-25 LAB — TROPONIN I
Troponin I: 0.11 ng/mL (ref <0.03)
Troponin I: 0.11 ng/mL (ref <0.03)

## 2019-01-25 LAB — LACTIC ACID, PLASMA
Lactic Acid, Venous: 1.1 mmol/L (ref 0.5–1.9)
Lactic Acid, Venous: 0.8 mmol/L (ref 0.5–1.9)

## 2019-01-25 LAB — SARS CORONAVIRUS 2 BY RT PCR (HOSPITAL ORDER, PERFORMED IN ~~LOC~~ HOSPITAL LAB): SARS Coronavirus 2: NEGATIVE

## 2019-01-25 MED ORDER — MAGNESIUM OXIDE 400 (241.3 MG) MG PO TABS
400.0000 mg | ORAL_TABLET | Freq: Two times a day (BID) | ORAL | Status: DC
  Administered 2019-01-25 – 2019-01-30 (×10): 400 mg via ORAL
  Filled 2019-01-25 (×10): qty 1

## 2019-01-25 MED ORDER — ATORVASTATIN CALCIUM 20 MG PO TABS
80.0000 mg | ORAL_TABLET | Freq: Every day | ORAL | Status: DC
  Administered 2019-01-26 – 2019-01-30 (×5): 80 mg via ORAL
  Filled 2019-01-25 (×5): qty 4

## 2019-01-25 MED ORDER — SODIUM CHLORIDE 0.9 % IV SOLN: 250.0000 mL | INTRAVENOUS | Status: DC | PRN

## 2019-01-25 MED ORDER — ONDANSETRON HCL 4 MG/2ML IJ SOLN: 4.0000 mg | Freq: Four times a day (QID) | INTRAMUSCULAR | Status: DC | PRN

## 2019-01-25 MED ORDER — TAMSULOSIN HCL 0.4 MG PO CAPS
0.4000 mg | ORAL_CAPSULE | Freq: Every day | ORAL | Status: DC
  Administered 2019-01-26 – 2019-01-30 (×5): 0.4 mg via ORAL
  Filled 2019-01-25 (×5): qty 1

## 2019-01-25 MED ORDER — SODIUM CHLORIDE 0.9% FLUSH: 3.0000 mL | INTRAVENOUS | Status: DC | PRN

## 2019-01-25 MED ORDER — FUROSEMIDE 10 MG/ML IJ SOLN
40.0000 mg | Freq: Once | INTRAMUSCULAR | Status: AC
  Administered 2019-01-25: 40 mg via INTRAVENOUS
  Filled 2019-01-25: qty 4

## 2019-01-25 MED ORDER — BRIMONIDINE TARTRATE 0.15 % OP SOLN
1.0000 [drp] | Freq: Three times a day (TID) | OPHTHALMIC | Status: DC
  Administered 2019-01-25 – 2019-01-30 (×14): 1 [drp] via OPHTHALMIC
  Filled 2019-01-25: qty 5

## 2019-01-25 MED ORDER — METOPROLOL SUCCINATE ER 100 MG PO TB24
200.0000 mg | ORAL_TABLET | Freq: Every day | ORAL | Status: DC
  Administered 2019-01-26 – 2019-01-30 (×4): 200 mg via ORAL
  Filled 2019-01-25 (×5): qty 2

## 2019-01-25 MED ORDER — APIXABAN 5 MG PO TABS
5.0000 mg | ORAL_TABLET | Freq: Two times a day (BID) | ORAL | Status: DC
  Administered 2019-01-25 – 2019-01-30 (×10): 5 mg via ORAL
  Filled 2019-01-25 (×11): qty 1

## 2019-01-25 MED ORDER — AMLODIPINE BESYLATE 10 MG PO TABS
10.0000 mg | ORAL_TABLET | Freq: Every day | ORAL | Status: DC
  Administered 2019-01-26 – 2019-01-30 (×4): 10 mg via ORAL
  Filled 2019-01-25 (×5): qty 1

## 2019-01-25 MED ORDER — NITROGLYCERIN 0.4 MG SL SUBL: 0.4000 mg | SUBLINGUAL_TABLET | SUBLINGUAL | Status: DC | PRN

## 2019-01-25 MED ORDER — VITAMIN D3 25 MCG (1000 UNIT) PO TABS
1000.0000 [IU] | ORAL_TABLET | Freq: Every day | ORAL | Status: DC
  Administered 2019-01-25 – 2019-01-30 (×6): 1000 [IU] via ORAL
  Filled 2019-01-25 (×6): qty 1

## 2019-01-25 MED ORDER — AMIODARONE HCL 200 MG PO TABS
200.0000 mg | ORAL_TABLET | Freq: Every day | ORAL | Status: DC
  Administered 2019-01-26 – 2019-01-30 (×5): 200 mg via ORAL
  Filled 2019-01-25 (×5): qty 1

## 2019-01-25 MED ORDER — FUROSEMIDE 10 MG/ML IJ SOLN
40.0000 mg | Freq: Two times a day (BID) | INTRAMUSCULAR | Status: DC
  Administered 2019-01-26 – 2019-01-28 (×4): 40 mg via INTRAVENOUS
  Filled 2019-01-25 (×3): qty 4

## 2019-01-25 MED ORDER — SODIUM CHLORIDE 0.9% FLUSH
3.0000 mL | Freq: Two times a day (BID) | INTRAVENOUS | Status: DC
  Administered 2019-01-25 – 2019-01-29 (×9): 3 mL via INTRAVENOUS

## 2019-01-25 MED ORDER — TIOTROPIUM BROMIDE MONOHYDRATE 18 MCG IN CAPS
18.0000 ug | ORAL_CAPSULE | Freq: Every day | RESPIRATORY_TRACT | Status: DC
  Administered 2019-01-26 – 2019-01-30 (×6): 18 ug via RESPIRATORY_TRACT
  Filled 2019-01-25 (×2): qty 5

## 2019-01-25 MED ORDER — ACETAMINOPHEN 325 MG PO TABS: 650.0000 mg | ORAL_TABLET | Freq: Four times a day (QID) | ORAL | Status: DC | PRN

## 2019-01-25 MED ORDER — ASPIRIN 81 MG PO CHEW
81.0000 mg | CHEWABLE_TABLET | Freq: Every day | ORAL | Status: DC
  Administered 2019-01-26 – 2019-01-30 (×5): 81 mg via ORAL
  Filled 2019-01-25 (×5): qty 1

## 2019-01-25 MED ORDER — POTASSIUM CHLORIDE CRYS ER 10 MEQ PO TBCR
10.0000 meq | EXTENDED_RELEASE_TABLET | Freq: Every day | ORAL | Status: DC
  Administered 2019-01-26 – 2019-01-27 (×2): 10 meq via ORAL
  Filled 2019-01-25 (×3): qty 1

## 2019-01-25 MED ORDER — LATANOPROST 0.005 % OP SOLN
1.0000 [drp] | Freq: Every day | OPHTHALMIC | Status: DC
  Administered 2019-01-25 – 2019-01-29 (×6): 1 [drp] via OPHTHALMIC
  Filled 2019-01-25: qty 2.5

## 2019-01-25 NOTE — Progress Notes (Signed)
Care Alignment Note  Advanced Directives Documents (Living Will, Power of Attorney) currently in the EHR no advanced directives documents available .  Has the patient discussed their wishes with their family/healthcare power of attorney no.  What does the patient/decision maker understand about their medical condition and the natural course of their disease.  Acute on chronic diastolic CHF exacerbation, COPD, chronic hypoxic respiratory failure, paroxysmal atrial fibrillation  What is the patient/decision maker's biggest fear or concern for the future pain and suffering   What is the most important goal for this patient should their health condition worsen maintenance of function.  Current   Code Status: Full Code  Current code status has been reviewed/updated.  Time spent:30 Minutes

## 2019-01-25 NOTE — ED Notes (Signed)
ED TO INPATIENT HANDOFF REPORT  ED Nurse Name and Phone #: Karena Addison [phone]  S Name/Age/Gender Darius Taylor 73 y.o. male Room/Bed: ED32A/ED32A  Code Status   Code Status: Full Code  Home/SNF/Other Home Patient oriented to: self Is this baseline? Yes   Triage Complete: Triage complete  Chief Complaint sob  Triage Note PT to ER via EMS from home with c/o [facility].  Pt was hospitalized recently with pneumonia and was released on Wednesday.  Pt states he has been too weak at home to get up and around much and feels more short of breath than before.  Pt states O2 was prn prior to hospitalization, but he has felt like he needed it all the time now.   Allergies No Known Allergies  Level of Care/Admitting Diagnosis ED Disposition    ED Disposition Condition [facility] Area: [place] [[id number]]  Level of Care: Telemetry [5]  Covid Evaluation: Screening Protocol (No Symptoms)  Diagnosis: Diastolic CHF, acute on chronic [facility]) [[id number]]  Admitting Physician: Otila Back [place]  Attending Physician: Otila Back [[id number]]  Estimated length of stay: past midnight tomorrow  Certification:: I certify this patient will need inpatient services for at least 2 midnights  PT Class (Do Not Modify): Inpatient [101]  PT Acc Code (Do Not Modify): Private [1]       B Medical/Surgery History Past Medical History:  Diagnosis Date  . Aortic stenosis   . Arrhythmia    atrial fibrillation  . Asthma   . Atrial fibrillation ([place])   . CAD (coronary artery disease)   . Cancer [facility])    colon, bladder and kidney  . CHF (congestive heart failure) ([place])   . 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 COPD (chronic obstructive pulmonary disease) ([place])   . H/O ventricular tachycardia   . Hypertension   . Nonischemic cardiomyopathy ([place])    Status post AICD and pacemaker  . Renal cell carcinoma [facility])    Past Surgical History:  Procedure Laterality Date  . CARDIAC DEFIBRILLATOR  PLACEMENT    . CARDIAC DEFIBRILLATOR PLACEMENT    . COLONOSCOPY WITH PROPOFOL N/A 06/10/2018   Procedure: COLONOSCOPY WITH PROPOFOL;  Surgeon: Lin Landsman, MD;  Location[facility] ENDOSCOPY;  Service: Gastroenterology;  Laterality: N/A;  . CORONARY ANGIOPLASTY  09/25/2016  . IRRIGATION AND DEBRIDEMENT HEMATOMA Left 10/24/2016   Procedure: IRRIGATION AND DEBRIDEMENT HEMATOMA;  Surgeon: Florene Glen, MD;  Location: [facility] ORS;  Service: General;  Laterality: Left;  . KIDNEY SURGERY     left ne[hrectomy for RCC  . PACEMAKER IMPLANT    . PARTIAL COLECTOMY       A IV Location/Drains/Wounds Patient Lines/Drains/Airways Status   Active Line/Drains/Airways    Name:   Placement date:   Placement time:   Site:   Days:   Peripheral IV 01/25/19 Left Antecubital   01/25/19    1433    Antecubital   less than 1          Intake/Output Last 24 hours No intake or output data in the 24 hours ending 01/25/19 1913  Labs/Imaging Results for orders placed or performed during the hospital encounter of 01/25/19 (from the past 48 hour(s))  Basic metabolic panel     Status: Abnormal   Collection Time: 01/25/19  2:35 PM  Result Value Ref Range   Sodium 143 135 - 145 mmol/L   Potassium 3.8 3.5 - 5.1 mmol/L   Chloride 105 98 - 111 mmol/L  CO2 29 22 - 32 mmol/L   Glucose, Bld 110 (H) 70 - 99 mg/dL   BUN 43 (H) 8 - 23 mg/dL   Creatinine, Ser 1.85 (H) 0.61 - 1.24 mg/dL   Calcium 8.7 (L) 8.9 - 10.3 mg/dL   GFR calc non Af Taylor 35 (L) >60 mL/min   GFR calc Af Taylor 41 (L) >60 mL/min   Anion gap 9 5 - 15    Comment: Performed at [facility], [address] [place], [place] [address]  CBC with Differential     Status: Abnormal   Collection Time: 01/25/19  2:35 PM  Result Value Ref Range   WBC 7.0 4.0 - 10.5 K/uL   RBC 2.54 (L) 4.22 - 5.81 MIL/uL   Hemoglobin 7.5 (L) 13.0 - 17.0 g/dL   HCT 25.2 (L) 39.0 - 52.0 %   MCV 99.2 80.0 - 100.0 fL   MCH 29.5 26.0 - 34.0 pg   MCHC 29.8 (L) 30.0  - 36.0 g/dL   RDW 16.5 (H) 11.5 - 15.5 %   Platelets 212 150 - 400 K/uL   nRBC 0.0 0.0 - 0.2 %   Neutrophils Relative % 72 %   Neutro Abs 5.0 1.7 - 7.7 K/uL   Lymphocytes Relative 11 %   Lymphs Abs 0.8 0.7 - 4.0 K/uL   Monocytes Relative 14 %   Monocytes Absolute 1.0 0.1 - 1.0 K/uL   Eosinophils Relative 3 %   Eosinophils Absolute 0.2 0.0 - 0.5 K/uL   Basophils Relative 0 %   Basophils Absolute 0.0 0.0 - 0.1 K/uL   Immature Granulocytes 0 %   Abs Immature Granulocytes 0.02 0.00 - 0.07 K/uL    Comment: Performed at [facility], [address] [name], [place] [address]  Troponin I - Once     Status: Abnormal   Collection Time: 01/25/19  2:35 PM  Result Value Ref Range   Troponin I 0.11 (HH) <0.03 ng/mL    Comment: CRITICAL RESULT CALLED TO, READ BACK BY AND VERIFIED WITH JENNIFER SMITH @1600  01/25/19 AKT Performed at [facility], [address] [place], [place] [address]   Brain natriuretic peptide     Status: Abnormal   Collection Time: 01/25/19  2:35 PM  Result Value Ref Range   B Natriuretic Peptide 419.0 (H) 0.0 - 100.0 pg/mL    Comment: Performed at [facility], [address] [place], [place] [address]  Lactic acid, plasma     Status: None   Collection Time: 01/25/19  2:35 PM  Result Value Ref Range   Lactic Acid, Venous 1.1 0.5 - 1.9 mmol/L    Comment: Performed at [facility], [address] [place], [place] [address]  Lactic acid, plasma     Status: None   Collection Time: 01/25/19  4:48 PM  Result Value Ref Range   Lactic Acid, Venous 0.8 0.5 - 1.9 mmol/L    Comment: Performed at [facility], [address] [place], [place] [address]  SARS Coronavirus 2 (CEPHEID- Performed in [place] hospital lab), Hosp Order     Status: None   Collection Time: 01/25/19  6:06 PM  Result Value Ref Range   SARS Coronavirus 2 NEGATIVE NEGATIVE    Comment: (NOTE) If result is NEGATIVE SARS-CoV-2 target nucleic acids are  NOT DETECTED. The SARS-CoV-2 RNA is generally detectable in upper and lower  respiratory specimens during the acute phase of infection. The lowest  concentration of SARS-CoV-2 viral  copies this assay can detect is 250  copies / mL. A negative result does not preclude SARS-CoV-2 infection  and should not be used as the sole basis for treatment or other  patient management decisions.  A negative result may occur with  improper specimen collection / handling, submission of specimen other  than nasopharyngeal swab, presence of viral mutation(s) within the  areas targeted by this assay, and inadequate number of viral copies  (<250 copies / mL). A negative result must be combined with clinical  observations, patient history, and epidemiological information. If result is POSITIVE SARS-CoV-2 target nucleic acids are DETECTED. The SARS-CoV-2 RNA is generally detectable in upper and lower  respiratory specimens dur ing the acute phase of infection.  Positive  results are indicative of active infection with SARS-CoV-2.  Clinical  correlation with patient history and other diagnostic information is  necessary to determine patient infection status.  Positive results do  not rule out bacterial infection or co-infection with other viruses. If result is PRESUMPTIVE POSTIVE SARS-CoV-2 nucleic acids MAY BE PRESENT.   A presumptive positive result was obtained on the submitted specimen  and confirmed on repeat testing.  While 2019 novel coronavirus  (SARS-CoV-2) nucleic acids may be present in the submitted sample  additional confirmatory testing may be necessary for epidemiological  and / or clinical management purposes  to differentiate between  SARS-CoV-2 and other Sarbecovirus currently known to infect humans.  If clinically indicated additional testing with an alternate test  methodology [phone]) is advised. The SARS-CoV-2 RNA is generally  detectable in upper and lower respiratory sp ecimens  during the acute  phase of infection. The expected result is Negative. Fact Sheet for Patients:  StrictlyIdeas.no Fact Sheet for Healthcare Providers: BankingDealers.co.za This test is not yet approved or cleared by the [place] FDA and has been authorized for detection and/or diagnosis of SARS-CoV-2 by FDA under an Emergency Use Authorization (EUA).  This EUA will remain in effect (meaning this test can be used) for the duration of the COVID-19 declaration under Section 564(b)(1) of the Act, 21 U.S.C. section 360bbb-3(b)(1), unless the authorization is terminated or revoked sooner. Performed at [facility], [address] [place], [place] [address]    Dg Chest Portable 1 View  Result Date: 01/25/2019 CLINICAL DATA:  Dyspnea EXAM: PORTABLE CHEST 1 VIEW COMPARISON:  01/21/2019 chest radiograph. FINDINGS: Stable configuration of 2 lead left subclavian ICD. Stable cardiomediastinal silhouette with moderate cardiomegaly. Cardiac valvular prosthesis is in place. No pneumothorax. No pleural effusion. Mild-to-moderate pulmonary edema, slightly improved. Stable mild left lung base patchy opacity. IMPRESSION: 1. Mild-to-moderate congestive heart failure, slightly improved. 2. Stable mild patchy left lung base opacity, favor atelectasis. Electronically Signed   By: Ilona Sorrel M.D.   On: 01/25/2019 14:25    Pending Labs Unresulted Labs (From admission, onward)    Start     Ordered   01/26/19 5364  Basic metabolic panel  Daily,   STAT     01/25/19 1855   01/26/19 0500  Magnesium  Tomorrow morning,   STAT     01/25/19 1904   01/26/19 0500  Phosphorus  Tomorrow morning,   STAT     01/25/19 1904   01/25/19 2200  Troponin I - Once  Once,   STAT     01/25/19 1904   01/25/19 1904  Troponin I - Once  Once,   STAT     01/25/19 1904  Vitals/Pain Today's Vitals   01/25/19 1417 01/25/19 1617 01/25/19 1745 01/25/19 1817  BP:   (!) 151/80 (!) 142/88 (!) 142/82  Pulse:  60 63 63  Resp:  (!) 21 (!) 21   Temp:      TempSrc:      SpO2:  97% 98% 95%  Weight: 102.3 kg     Height: 5\' 11"  (1.803 m)     PainSc: 0-No pain       Isolation Precautions Droplet and Contact precautions  Medications Medications  aspirin chewable tablet 81 mg (has no administration in time range)  amiodarone (PACERONE) tablet 200 mg (has no administration in time range)  amLODipine (NORVASC) tablet 10 mg (has no administration in time range)  atorvastatin (LIPITOR) tablet 80 mg (has no administration in time range)  furosemide (LASIX) injection 40 mg (has no administration in time range)  metoprolol succinate (TOPROL-XL) 24 hr tablet 200 mg (has no administration in time range)  nitroGLYCERIN (NITROSTAT) SL tablet 0.4 mg (has no administration in time range)  magnesium oxide (MAG-OX) tablet 400 mg (has no administration in time range)  tamsulosin (FLOMAX) capsule 0.4 mg (has no administration in time range)  apixaban (ELIQUIS) tablet 5 mg (has no administration in time range)  cholecalciferol (VITAMIN D) tablet 1,000 Units (has no administration in time range)  potassium chloride (K-DUR) CR tablet 10 mEq (has no administration in time range)  tiotropium (SPIRIVA) inhalation capsule ([facility] use ONLY) 18 mcg (has no administration in time range)  brimonidine (ALPHAGAN) 0.15 % ophthalmic solution 1 drop (has no administration in time range)  latanoprost (XALATAN) 0.005 % ophthalmic solution 1 drop (has no administration in time range)  acetaminophen (TYLENOL) tablet 650 mg (has no administration in time range)  sodium chloride flush (NS) 0.9 % injection 3 mL (has no administration in time range)  sodium chloride flush (NS) 0.9 % injection 3 mL (has no administration in time range)  0.9 %  sodium chloride infusion (has no administration in time range)  ondansetron (ZOFRAN) injection 4 mg (has no administration in time range)  furosemide  (LASIX) injection 40 mg (40 mg Intravenous Given 01/25/19 1749)    Mobility walks with person assist Low fall risk   Focused Assessments    R Recommendations: See Admitting Provider Note  Report given to:

## 2019-01-25 NOTE — ED Notes (Signed)
Date and time results received: 01/25/19 4:02 PM   Test: Troponin Critical Value: 0.11  Name of Provider Notified: [place], [place]

## 2019-01-25 NOTE — ED Provider Notes (Signed)
[facility] Emergency Department Provider Note ____________________________________________  Time seen: 1410  I have reviewed the triage vital signs and the nursing notes.  HISTORY  Chief Complaint  Shortness of Breath  HPI Darius Taylor is a 73 y.o. male to the ED via EMS from home, with complaints of shortness of breath and weakness.  Patient was recently discharged after a prolonged admission for community-acquired pneumonia on Wednesday.  He presents today noting difficulty with energy and continued shortness of breath.  He denies any fevers, chills, sweats.  He denies any falls, cough, or congestion.  Patient reports has been too weak to get around much at home.  He presents now for further evaluation.  Past Medical History:  Diagnosis Date  . Aortic stenosis   . Arrhythmia    atrial fibrillation  . Asthma   . Atrial fibrillation ([place])   . CAD (coronary artery disease)   . Cancer [facility])    colon, bladder and kidney  . CHF (congestive heart failure) ([place])   . 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 COPD (chronic obstructive pulmonary disease) ([place])   . H/O ventricular tachycardia   . Hypertension   . Nonischemic cardiomyopathy ([place])    Status post AICD and pacemaker  . Renal cell carcinoma [facility])     Patient Active Problem List   Diagnosis Date Noted  . CAD (coronary artery disease) 12/30/2018  . Acute on chronic renal failure ([place]) 12/30/2018  . Alcoholic intoxication without complication ([place]) 92/07/9416  . CHF exacerbation ([place]) 11/03/2018  . Chronic diastolic heart failure ([name]) 09/24/2018  . HTN (hypertension) 09/24/2018  . Acute on chronic respiratory failure with hypoxemia ([place]) 08/30/2018  . History of colon cancer   . COPD (chronic obstructive pulmonary disease) ([place]) 01/08/2018  . Cellulitis 11/18/2017  . Hematoma     Past Surgical History:  Procedure Laterality Date  . CARDIAC DEFIBRILLATOR PLACEMENT    . CARDIAC DEFIBRILLATOR PLACEMENT     . COLONOSCOPY WITH PROPOFOL N/A 06/10/2018   Procedure: COLONOSCOPY WITH PROPOFOL;  Surgeon: Lin Landsman, MD;  Location: [facility] ENDOSCOPY;  Service: Gastroenterology;  Laterality: N/A;  . CORONARY ANGIOPLASTY  09/25/2016  . IRRIGATION AND DEBRIDEMENT HEMATOMA Left 10/24/2016   Procedure: IRRIGATION AND DEBRIDEMENT HEMATOMA;  Surgeon: Florene Glen, MD;  Location: [facility] ORS;  Service: General;  Laterality: Left;  . KIDNEY SURGERY     left ne[hrectomy for RCC  . PACEMAKER IMPLANT    . PARTIAL COLECTOMY      Prior to Admission medications   Medication Sig Start Date End Date Taking? Authorizing Provider  amiodarone (PACERONE) 200 MG tablet Take 200 mg by mouth daily.   Yes [provider]  amLODipine (NORVASC) 10 MG tablet Take 10 mg by mouth daily.   Yes [provider]  apixaban (ELIQUIS) 5 MG TABS tablet Take 5 mg by mouth 2 (two) times daily.   Yes [provider]  aspirin 81 MG chewable tablet Chew 81 mg by mouth daily.   Yes [provider]  atorvastatin (LIPITOR) 80 MG tablet Take 80 mg by mouth daily. 12/02/18  Yes [provider]  brimonidine (ALPHAGAN) 0.15 % ophthalmic solution Place 1 drop into both eyes 3 (three) times daily. 12/20/17  Yes [provider]  cholecalciferol (VITAMIN D) 1000 units tablet Take 1,000 Units by mouth daily.   Yes [provider]  furosemide (LASIX) 40 MG tablet Take 1 tablet (40 mg total) by mouth 2 (two) times daily. 01/21/19  Yes Mody, Sital, MD  latanoprost (XALATAN) 0.005 % ophthalmic solution Place 1 drop into both eyes at bedtime. 12/20/17  Yes [provider]  magnesium oxide (MAG-OX) 400 MG tablet Take 400 mg by mouth 2 (two) times daily.   Yes [provider]  metoprolol (TOPROL-XL) 200 MG 24 hr tablet Take 200 mg by mouth daily.   Yes [provider]  nitroGLYCERIN (NITROSTAT) 0.4 MG SL tablet Place 0.4 mg under the tongue every 5 (five) minutes as  needed for chest pain.   Yes [provider]  potassium chloride (K-DUR) 10 MEQ tablet Take 1 tablet (10 mEq total) by mouth daily. 01/21/19  Yes Mody, Ulice Bold, MD  tamsulosin (FLOMAX) 0.4 MG CAPS capsule Take 1 capsule (0.4 mg total) by mouth daily. 01/22/19  Yes Mody, Ulice Bold, MD  tiotropium (SPIRIVA HANDIHALER) 18 MCG inhalation capsule Place 1 capsule (18 mcg total) into inhaler and inhale daily for 30 days. 11/12/18 01/25/19 Yes Wieting, Richard, MD  acetaminophen (TYLENOL) 325 MG tablet Take 650 mg by mouth every 6 (six) hours as needed for mild pain or fever.     [provider]    Allergies Patient has no known allergies.  Family History  Problem Relation Age of Onset  . Hypertension Mother   . Stroke Father     Social History Social History   Tobacco Use  . Smoking status: Former Research scientist (life sciences)  . Smokeless tobacco: Never Used  Substance Use Topics  . Alcohol use: No  . Drug use: No    Review of Systems  Constitutional: Negative for fever. Reports weakness Eyes: Negative for visual changes. ENT: Negative for sore throat. Cardiovascular: Negative for chest pain. Respiratory: Positive for shortness of breath. Gastrointestinal: Negative for abdominal pain, vomiting and diarrhea. Genitourinary: Negative for dysuria. Musculoskeletal: Negative for back pain. Skin: Negative for rash. Neurological: Negative for headaches, focal weakness or numbness. ____________________________________________  PHYSICAL EXAM:  VITAL SIGNS: ED Triage Vitals  Enc Vitals Group     BP 01/25/19 1416 98/70     Pulse Rate 01/25/19 1416 69     Resp 01/25/19 1416 20     Temp 01/25/19 1416 98.4 F (36.9 C)     Temp Source 01/25/19 1416 Oral     SpO2 01/25/19 1416 99 %     Weight 01/25/19 1417 225 lb 8.5 oz (102.3 kg)     Height 01/25/19 1417 5\' 11"  (1.803 m)     Head Circumference --      Peak Flow --      Pain Score 01/25/19 1417 0     Pain Loc --      Pain Edu? --      Excl. in  [place]? --     Constitutional: Alert and oriented. Well appearing and in no distress. Head: Normocephalic and atraumatic. Eyes: Conjunctivae are normal. Normal extraocular movements Cardiovascular: Normal rate, regular rhythm. Normal distal pulses.  Respiratory: Normal respiratory effort. No wheezes/rales/rhonchi. Gastrointestinal: Soft and nontender. No distention. Musculoskeletal: Nontender with normal range of motion in all extremities.  Neurologic:  Normal gait without ataxia. Normal speech and language. No gross focal neurologic deficits are appreciated. Skin:  Skin is warm, dry and intact. No rash noted. No distal pitting edema Psychiatric: Mood and affect are normal. Patient exhibits appropriate insight and judgment. ____________________________________________   LABS (pertinent positives/negatives) Labs Reviewed  BASIC METABOLIC PANEL - Abnormal; Notable for the following components:      Result Value   Glucose, Bld 110 (*)  BUN 43 (*)    Creatinine, Ser 1.85 (*)    Calcium 8.7 (*)    GFR calc non Af Amer 35 (*)    GFR calc Af Amer 41 (*)    All other components within normal limits  CBC WITH DIFFERENTIAL/PLATELET - Abnormal; Notable for the following components:   RBC 2.54 (*)    Hemoglobin 7.5 (*)    HCT 25.2 (*)    MCHC 29.8 (*)    RDW 16.5 (*)    All other components within normal limits  TROPONIN I - Abnormal; Notable for the following components:   Troponin I 0.11 (*)    All other components within normal limits  BRAIN NATRIURETIC PEPTIDE - Abnormal; Notable for the following components:   B Natriuretic Peptide 419.0 (*)    All other components within normal limits  SARS CORONAVIRUS 2 (HOSPITAL ORDER, [facility])  LACTIC ACID, PLASMA  LACTIC ACID, PLASMA  ____________________________________________  EKG  Atrial-paced rhythm 63 bpm Left axis deviations Incomplete LBBB No STEMI ____________________________________________    RADIOLOGY CXR IMPRESSION: 1. Mild-to-moderate congestive heart failure, slightly improved. 2. Stable mild patchy left lung base opacity, favor atelectasis. ____________________________________________  PROCEDURES  Procedures   Lasix 40 mg IVP ____________________________________________  INITIAL IMPRESSION / ASSESSMENT AND PLAN / ED COURSE  MELBURN TREIBER was evaluated in Emergency Department on 01/25/2019 for the symptoms described in the history of present illness. He was evaluated in the context of the global COVID-19 pandemic, which necessitated consideration that the patient might be at risk for infection with the SARS-CoV-2 virus that causes COVID-19. Institutional protocols and algorithms that pertain to the evaluation of patients at risk for COVID-19 are in a state of rapid change based on information released by regulatory bodies including the CDC and federal and state organizations. These policies and algorithms were followed during the patient's care in the ED.  Patient with CHF, CKD, HTN, and COPD presents to the ED about 4 days status post prolonged admission for a community-acquired pneumonia.  Patient has reported persistent weakness and shortness of breath since his discharge.  He returned today for evaluation of his shortness of breath.  He was found to have essentially able based labs including his chronic anemia of disease, chronically elevated troponin at 0.11, elevated BNP at 419.  Spoke to the hospitalist about admission for an acute exacerbation of his CHF and shortness of breath.  ----------------------------------------- 6:08 PM on 01/25/2019 ----------------------------------------- Dr. Stark Jock will see the patient and coordinate the admission.   ____________________________________________  FINAL CLINICAL IMPRESSION(S) / ED DIAGNOSES  Final diagnoses:  Acute on chronic congestive heart failure, unspecified heart failure type [facility])      Carmie End, Dannielle Karvonen, PA-C 01/25/19 1829    Nance Pear, MD 01/25/19 2039

## 2019-01-25 NOTE — ED Notes (Signed)
Pt st feeling "tightness" on mid chest. PA Thayer Headings at bedside at this time

## 2019-01-25 NOTE — ED Notes (Signed)
This RN attempted to call report w/o success.

## 2019-01-25 NOTE — ED Triage Notes (Signed)
PT to ER via EMS from home with c/o [facility].  Pt was hospitalized recently with pneumonia and was released on Wednesday.  Pt states he has been too weak at home to get up and around much and feels more short of breath than before.  Pt states O2 was prn prior to hospitalization, but he has felt like he needed it all the time now.

## 2019-01-25 NOTE — ED Provider Notes (Signed)
Apolonio Schneiders, attending physician, personally viewed and interpreted this EKG  EKG Time: 1423 Rate: 63 Rhythm: atrial paced rhythm Axis: left axis deviation Intervals: qtc 470 QRS: incomplete LBBB ST changes: no st elevation Impression: abnormal ekg    Nance Pear, MD 01/25/19 1742

## 2019-01-25 NOTE — H&P (Signed)
[facility] NAME: Darius Taylor    MR#:  [id number]  DATE OF BIRTH:  08/16/1946  DATE OF ADMISSION:  01/25/2019  PRIMARY CARE PHYSICIAN: [facility] [place]   REQUESTING/REFERRING PHYSICIAN: Nance Pear  CHIEF COMPLAINT:   Chief Complaint  Patient presents with  . Shortness of Breath    HISTORY OF PRESENT ILLNESS:  Darius Taylor  is a 73 y.o. male with a known history of paroxysmal atrial fibrillation on chronic anticoagulation with Eliquis, COPD, chronic hypoxic respiratory failure on 2 L of home oxygen therapy, chronic diastolic CHF, chronic kidney disease stage III and hypertension who was just recently discharged from the hospital on 01/21/2019 after treatment of acute on chronic diastolic CHF exacerbation who presented back to the emergency room today with complaints of worsening shortness of breath.  Denied any fevers.  No chest pain.  No cough.  No contact with anyone with COVID infection.  No recent travel.  Patient was evaluated in the emergency room and found to have troponin of 0.11 which is chronically elevated.  B-type natriuretic peptide elevated at 419.  Chest x-ray done revealed mild to moderate CHF which is reported as slightly improved.  Patient with some lower extremity edema.  Was diagnosed clinically with acute on chronic diastolic CHF exacerbation.  Given a dose of Lasix.  Medical service called to admit patient for further evaluation and management.  PAST MEDICAL HISTORY:   Past Medical History:  Diagnosis Date  . Aortic stenosis   . Arrhythmia    atrial fibrillation  . Asthma   . Atrial fibrillation ([place])   . CAD (coronary artery disease)   . Cancer [facility])    colon, bladder and kidney  . CHF (congestive heart failure) ([place])   . 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 COPD (chronic obstructive pulmonary disease) ([place])   . H/O ventricular tachycardia   . Hypertension   . Nonischemic cardiomyopathy  ([place])    Status post AICD and pacemaker  . Renal cell carcinoma ([place])     PAST SURGICAL HISTORY:   Past Surgical History:  Procedure Laterality Date  . CARDIAC DEFIBRILLATOR PLACEMENT    . CARDIAC DEFIBRILLATOR PLACEMENT    . COLONOSCOPY WITH PROPOFOL N/A 06/10/2018   Procedure: COLONOSCOPY WITH PROPOFOL;  Surgeon: Lin Landsman, MD;  Location: [facility] ENDOSCOPY;  Service: Gastroenterology;  Laterality: N/A;  . CORONARY ANGIOPLASTY  09/25/2016  . IRRIGATION AND DEBRIDEMENT HEMATOMA Left 10/24/2016   Procedure: IRRIGATION AND DEBRIDEMENT HEMATOMA;  Surgeon: Florene Glen, MD;  Location: [facility] ORS;  Service: General;  Laterality: Left;  . KIDNEY SURGERY     left ne[hrectomy for RCC  . PACEMAKER IMPLANT    . PARTIAL COLECTOMY      SOCIAL HISTORY:   Social History   Tobacco Use  . Smoking status: Former Research scientist (life sciences)  . Smokeless tobacco: Never Used  Substance Use Topics  . Alcohol use: No    FAMILY HISTORY:   Family History  Problem Relation Age of Onset  . Hypertension Mother   . Stroke Father     DRUG ALLERGIES:  No Known Allergies  REVIEW OF SYSTEMS:   Review of Systems  Constitutional: Negative for chills and fever.  HENT: Negative for hearing loss and tinnitus.   Eyes: Negative for blurred vision and double vision.  Respiratory: Positive for shortness of breath. Negative for cough and wheezing.   Cardiovascular: Negative for chest pain and palpitations.  Gastrointestinal: Negative  for abdominal pain, heartburn, nausea and vomiting.  Genitourinary: Negative for dysuria and urgency.  Musculoskeletal: Negative for myalgias and neck pain.  Skin: Negative for itching and rash.  Neurological: Negative for dizziness and headaches.  Psychiatric/Behavioral: Negative for depression and hallucinations.    MEDICATIONS AT HOME:   Prior to Admission medications   Medication Sig Start Date End Date Taking? Authorizing Provider  amiodarone (PACERONE) 200 MG tablet Take  200 mg by mouth daily.   Yes [provider]  amLODipine (NORVASC) 10 MG tablet Take 10 mg by mouth daily.   Yes [provider]  apixaban (ELIQUIS) 5 MG TABS tablet Take 5 mg by mouth 2 (two) times daily.   Yes [provider]  aspirin 81 MG chewable tablet Chew 81 mg by mouth daily.   Yes [provider]  atorvastatin (LIPITOR) 80 MG tablet Take 80 mg by mouth daily. 12/02/18  Yes [provider]  brimonidine (ALPHAGAN) 0.15 % ophthalmic solution Place 1 drop into both eyes 3 (three) times daily. 12/20/17  Yes [provider]  cholecalciferol (VITAMIN D) 1000 units tablet Take 1,000 Units by mouth daily.   Yes [provider]  furosemide (LASIX) 40 MG tablet Take 1 tablet (40 mg total) by mouth 2 (two) times daily. 01/21/19  Yes Mody, Sital, MD  latanoprost (XALATAN) 0.005 % ophthalmic solution Place 1 drop into both eyes at bedtime. 12/20/17  Yes [provider]  magnesium oxide (MAG-OX) 400 MG tablet Take 400 mg by mouth 2 (two) times daily.   Yes [provider]  metoprolol (TOPROL-XL) 200 MG 24 hr tablet Take 200 mg by mouth daily.   Yes [provider]  nitroGLYCERIN (NITROSTAT) 0.4 MG SL tablet Place 0.4 mg under the tongue every 5 (five) minutes as needed for chest pain.   Yes [provider]  potassium chloride (K-DUR) 10 MEQ tablet Take 1 tablet (10 mEq total) by mouth daily. 01/21/19  Yes Mody, Ulice Bold, MD  tamsulosin (FLOMAX) 0.4 MG CAPS capsule Take 1 capsule (0.4 mg total) by mouth daily. 01/22/19  Yes Mody, Ulice Bold, MD  tiotropium (SPIRIVA HANDIHALER) 18 MCG inhalation capsule Place 1 capsule (18 mcg total) into inhaler and inhale daily for 30 days. 11/12/18 01/25/19 Yes Wieting, Richard, MD  acetaminophen (TYLENOL) 325 MG tablet Take 650 mg by mouth every 6 (six) hours as needed for mild pain or fever.     [provider]      VITAL SIGNS:  Blood pressure (!) 142/82, pulse 63,  temperature 98.4 F (36.9 C), temperature source Oral, resp. rate (!) 21, height 5\' 11"  (1.803 m), weight 102.3 kg, SpO2 95 %.  PHYSICAL EXAMINATION:  Physical Exam  GENERAL:  73 y.o.-year-old patient lying in the bed with no acute distress.  EYES: Pupils equal, round, reactive to light and accommodation. No scleral icterus. Extraocular muscles intact.  HEENT: Head atraumatic, normocephalic. Oropharynx and nasopharynx clear.  NECK:  Supple, no jugular venous distention. No thyroid enlargement, no tenderness.  LUNGS: Rales bilaterally. No use of accessory muscles of respiration.  CARDIOVASCULAR: S1, S2 normal. No murmurs, rubs, or gallops.  ABDOMEN: Soft, nontender, nondistended. Bowel sounds present. No organomegaly or mass.  EXTREMITIES: 1-2+ pitting pedal edema bilaterally, cyanosis, or clubbing.  NEUROLOGIC: Cranial nerves II through XII are intact. Muscle strength 5/5 in all extremities. Sensation intact. Gait not checked.  PSYCHIATRIC: The patient is alert and oriented x 3.  SKIN: No obvious rash, lesion, or ulcer.   LABORATORY PANEL:  CBC Recent Labs  Lab 01/25/19 1435  WBC 7.0  HGB 7.5*  HCT 25.2*  PLT 212   ------------------------------------------------------------------------------------------------------------------  Chemistries  Recent Labs  Lab 01/25/19 1435  NA 143  K 3.8  CL 105  CO2 29  GLUCOSE 110*  BUN 43*  CREATININE 1.85*  CALCIUM 8.7*   ------------------------------------------------------------------------------------------------------------------  Cardiac Enzymes Recent Labs  Lab 01/25/19 1435  TROPONINI 0.11*   ------------------------------------------------------------------------------------------------------------------  RADIOLOGY:  Dg Chest Portable 1 View  Result Date: 01/25/2019 CLINICAL DATA:  Dyspnea EXAM: PORTABLE CHEST 1 VIEW COMPARISON:  01/21/2019 chest radiograph. FINDINGS: Stable configuration of 2 lead left subclavian  ICD. Stable cardiomediastinal silhouette with moderate cardiomegaly. Cardiac valvular prosthesis is in place. No pneumothorax. No pleural effusion. Mild-to-moderate pulmonary edema, slightly improved. Stable mild left lung base patchy opacity. IMPRESSION: 1. Mild-to-moderate congestive heart failure, slightly improved. 2. Stable mild patchy left lung base opacity, favor atelectasis. Electronically Signed   By: Ilona Sorrel M.D.   On: 01/25/2019 14:25      IMPRESSION AND PLAN:  Patient is a 73 year old male with history of chronic diastolic CHF, chronic hypoxic respiratory failure on 2 L of home oxygen therapy, hypertension, CKD 3 and proximal atrial fibrillation admitted for management of acute on chronic diastolic CHF exacerbation  1.  Acute on chronic diastolic CHF exacerbation Recently discharged from the hospital.  Presented back with worsening shortness of breath. Placed on diuresis with IV Lasix 40 mg every 12 hourly.  Monitor daily weights.  Low-sodium diet restriction. Continue metoprolol succinate. Follow-up serial cardiac enzymes.  Troponin noted to be mildly elevated chronically. Recent 2D echocardiogram in February 2020 with ejection fraction of 50 to 55%.  No indication to repeat 2D echocardiogram at this time. No history of contact with anyone with cold with infection.  COVID virus testing requested for screening purposes given recurrent admissions with respiratory issues.  2.  Paroxysmal atrial fibrillation Rate controlled.  Continue metoprolol succinate.  Patient already on anticoagulation with Eliquis.  Continue the same.  3.  COPD Stable  4.  Chronic hypoxic respiratory failure Stable.  Continue home oxygen therapy at 2 L as previously.  5.  Chronic kidney disease stage III Stable.  Monitor renal function with ongoing diuresis.  DVT prophylaxis; patient already on Eliquis.   All the records are reviewed and case discussed with ED provider. Management plans discussed  with the patient, family and they are in agreement.  CODE STATUS: Full code  TOTAL TIME TAKING CARE OF THIS PATIENT: 58 minutes.    Stephanye Finnicum M.D on 01/25/2019 at 6:57 PM  Between 7am to 6pm - Pager - [phone]  After 6pm go to [email or web address] - [employer] [facility]  Office  [phone]  CC: Primary care physician; [employer]   Note: This dictation was prepared with Diplomatic Services operational officer dictation along with smaller Company secretary. Any transcriptional errors that result from this process are unintentional.

## 2019-01-26 LAB — TROPONIN I: Troponin I: 0.1 ng/mL (ref <0.03)

## 2019-01-26 LAB — BASIC METABOLIC PANEL
Anion gap: 8 (ref 5–15)
BUN: 40 mg/dL — ABNORMAL HIGH (ref 8–23)
CO2: 29 mmol/L (ref 22–32)
Calcium: 8.3 mg/dL — ABNORMAL LOW (ref 8.9–10.3)
Chloride: 106 mmol/L (ref 98–111)
Creatinine, Ser: 1.66 mg/dL — ABNORMAL HIGH (ref 0.61–1.24)
GFR calc Af Amer: 46 mL/min — ABNORMAL LOW (ref >60)
GFR calc non Af Amer: 40 mL/min — ABNORMAL LOW (ref >60)
Glucose, Bld: 79 mg/dL (ref 70–99)
Potassium: 3.7 mmol/L (ref 3.5–5.1)
Sodium: 143 mmol/L (ref 135–145)

## 2019-01-26 LAB — MAGNESIUM: Magnesium: 2.3 mg/dL (ref 1.7–2.4)

## 2019-01-26 LAB — RETICULOCYTES
Immature Retic Fract: 24.3 % — ABNORMAL HIGH (ref 2.3–15.9)
RBC.: 2.43 MIL/uL — ABNORMAL LOW (ref 4.22–5.81)
Retic Count, Absolute: 124.2 10*3/uL (ref 19.0–186.0)
Retic Ct Pct: 5.1 % — ABNORMAL HIGH (ref 0.4–3.1)

## 2019-01-26 LAB — IRON AND TIBC
Iron: 33 ug/dL — ABNORMAL LOW (ref 45–182)
Saturation Ratios: 9 % — ABNORMAL LOW (ref 17.9–39.5)
TIBC: 373 ug/dL (ref 250–450)
UIBC: 340 ug/dL

## 2019-01-26 LAB — VITAMIN B12: Vitamin B-12: 401 pg/mL (ref 180–914)

## 2019-01-26 LAB — FOLATE: Folate: 19.3 ng/mL (ref >5.9)

## 2019-01-26 LAB — FERRITIN: Ferritin: 91 ng/mL (ref 24–336)

## 2019-01-26 LAB — PHOSPHORUS: Phosphorus: 4.4 mg/dL (ref 2.5–4.6)

## 2019-01-26 MED ORDER — SODIUM CHLORIDE 0.9 % IV SOLN
200.0000 mg | Freq: Once | INTRAVENOUS | Status: AC
  Administered 2019-01-26: 200 mg via INTRAVENOUS
  Filled 2019-01-26: qty 10

## 2019-01-26 NOTE — Evaluation (Signed)
Physical Therapy Evaluation Patient Details Name: Darius Taylor MRN: [id number] DOB: 1945/11/12 Today's Date: 01/26/2019   History of Present Illness  Darius Taylor is a 73yo male who comes [facility] on 5/[facility] with acutely worse SOB. Pt was just recently DC from this site after 3+week admission adn similar presentation. PMH: AF on eliquis, CAD, NICM c EF 25%, AICD and PPM, CKD, colonic adenocarcinoma s/p partial colectomy, renal cell carcinoma s/p Left nephrectomy, COPD, CRF on home O2 2-3L/min. Pt also noted to have increased BNP upon admission and elevated troponin (0.11) noted to be baseline phenomenon.   Clinical Impression  Pt admitted with above diagnosis. Pt currently with functional limitations due to the deficits listed below (see "PT Problem List"). Patient is familiar to our services from recent prior admission. Lab values from today showing Hb: 7.5, HCT 25.2. Upon entry, pt seated @ EOB, awake and agreeable to participate. The pt is alert and oriented x3, pleasant, conversational, and generally a good historian. Pt is apprehensive, frustrated at times in describing his use of O2 PTA, at various points clarifying that he feels prior and current recommendations (for O2 use) either exceed his needs or are not congruent with the burden they impose upon him, particularly in regard to the lack of portability of his concentrator unit at home. He attests to monitoring of sats, expresses his goal of returning to a time whence he no longer needs O2 at all times. He reports that his concentrator is large and heavy with limited battery life so he frequently doffs [place] when performing tasks around the house, showering etc, and dons once he is SOB and/or SpO2 drops below 90s%. He denies nighttime O2 use because he 'doesn't need it' but when asked if nocturnal use was medically recommended, he responds vaguely and without confidence. Author asks pt if having additional O2 tubing would improve his compliance with O2  use, and he reports that it WOULD NOT, as he does not 'want to carry all that tubing around the house.' Pt generates a proposed solution to solve compliance issue wherein he would like to have a portable concentrator that is small enough for him to wear around the house.   Pt was not home for more than a few days after a lengthy hospitalization, only having seen HHPT 1x PTA, it is without surprise that the patient continues to endorse some weakness; however, pt is able to AMB a greater distance this date and perform mobility on a lower flow rate than previously seen during last admission. Pt still has desaturation to 89% on 4L/min during household distance AMB with a RW. Functional mobility assessment demonstrates increased effort/time requirements, poor tolerance, but no frank need for physical assistance, whereas the patient performed these at a higher level of independence PTA more than 1 month ago. Appraisal of pt's functional capacity is complex, as his CRF/COPD/CHF are only one aspect, but he is also limited by other chronic problems such as his EF:25% and his NICM. Also suspect to be limiting is his low H&H, whereas his Hb was in 12s at beginning of last admission and 10s prior to that, but remains in 7s at this time. Regardless his ambulatory limitations correlate fairly well to O2 saturations, hence improved compliance with home O2 is vital for immediate success. Pt will benefit from skilled PT intervention to increase independence and safety with basic mobility in preparation for discharge to the venue listed below.       Follow Up Recommendations Home  health PT;Supervision - Intermittent    Equipment Recommendations  None recommended by PT    Recommendations for Other Services       Precautions / Restrictions Precautions Precautions: Fall Restrictions Weight Bearing Restrictions: No      Mobility  Bed Mobility               General bed mobility comments: Received at EOB upon  entry. Pt reports he likes to sit up after eating, and definitely does not like to eat while lying down in bed.   Transfers Overall transfer level: Needs assistance Equipment used: None Transfers: Sit to/from Stand           General transfer comment: reports some dizziness upon standing, that lasts <1 minutes. (correlate with cardiac history and Hb: <8.0 this date)   Ambulation/Gait Ambulation/Gait assistance: Supervision Gait Distance (Feet): 200 Feet Assistive device: Rolling walker (2 wheeled)(per pt preference, although he reports no use of RW at home) Gait Pattern/deviations: WFL(Within Functional Limits) Gait velocity: 0.9m/s    General Gait Details: AMB on 3L/min as patient reports PTA; 91% at 39ft, 85% aftr 272ft. Recovery to 89% requires 60+seconds seated at EOB on 3L/min. (2nd walk performed after rest break)  Stairs            Wheelchair Mobility    Modified Rankin (Stroke Patients Only)       Balance Overall balance assessment: Needs assistance Sitting-balance support: No upper extremity supported;Feet supported Sitting balance-Leahy Scale: Normal     Standing balance support: During functional activity;No upper extremity supported Standing balance-Leahy Scale: Fair Standing balance comment: requires BUE support for safe dynamic balance                             Pertinent Vitals/Pain Pain Assessment: No/denies pain    Home Living Family/patient expects to be discharged to:: Private residence Living Arrangements: Alone Available Help at Discharge: Family;Available PRN/intermittently(DTR helps c tranportation, groceries, meals; DTR works full time first. ) Type of Home: House Home Access: Stairs to enter Entrance Stairs-Rails: Can reach both Entrance Stairs-Number of Steps: 3 Home Layout: One level Home Equipment: Shower seat;Cane - quad;Walker - 4 wheels;Walker - 2 wheels;Grab bars - toilet;Grab bars - tub/shower Additional  Comments: Sleeps without O2 at night because he 'doesn't need it.'     Prior Function Level of Independence: Needs assistance   Gait / Transfers Assistance Needed: No AD use for household AMB,  reports no balance difficulty, no falls in the past 6 months.   ADL's / Homemaking Assistance Needed: Independent with basic ADL; DTR helps with grocferies, meals, transport. Pt manages his own medications.   Comments: [facility].      Hand Dominance   Dominant Hand: Right    Extremity/Trunk Assessment   Upper Extremity Assessment Upper Extremity Assessment: Overall WFL for tasks assessed    Lower Extremity Assessment Lower Extremity Assessment: Overall WFL for tasks assessed    Cervical / Trunk Assessment Cervical / Trunk Assessment: Normal  Communication   Communication: No difficulties  Cognition Arousal/Alertness: Awake/alert Behavior During Therapy: WFL for tasks assessed/performed Overall Cognitive Status: Within Functional Limits for tasks assessed                                        General Comments  Exercises Other Exercises Other Exercises: 2nd Trial AMB, now on 4L/min; 13ft c RW (PT manages O2 tank); terminal SpO2: 89%(Pt agreeable to AMB another bout, but not as far as first. )   Assessment/Plan    PT Assessment Patient needs continued PT services  PT Problem List Decreased strength;Decreased range of motion;Decreased activity tolerance;Decreased mobility;Decreased balance;Decreased coordination;Decreased knowledge of use of DME;Decreased safety awareness;Decreased knowledge of precautions       PT Treatment Interventions DME instruction;Gait training;Stair training;Functional mobility training;Balance training;Therapeutic exercise;Therapeutic activities;Neuromuscular re-education;Patient/family education    PT Goals (Current goals can be found in the Care Plan section)  Acute Rehab PT Goals Patient  Stated Goal: have a smaller portable O2 concentrator that he can wear while moving.  PT Goal Formulation: With patient Time For Goal Achievement: 01/23/19 Potential to Achieve Goals: Good    Frequency Min 2X/week   Barriers to discharge Other (comment) pt reports his current O2 set-up at home does not allow for convenient use of constant O2, hence he continues to doff [place] with activity.     Co-evaluation               AM-PAC PT "6 Clicks" Mobility  Outcome Measure Help needed turning from your back to your side while in a flat bed without using bedrails?: None Help needed moving from lying on your back to sitting on the side of a flat bed without using bedrails?: None Help needed moving to and from a bed to a chair (including a wheelchair)?: None Help needed standing up from a chair using your arms (e.g., wheelchair or bedside chair)?: None Help needed to walk in hospital room?: A Little Help needed climbing 3-5 steps with a railing? : A Little 6 Click Score: 22    End of Session Equipment Utilized During Treatment: Gait belt;Oxygen Activity Tolerance: Patient limited by fatigue Patient left: in chair;with call bell/phone within reach;Other (comment)(RN reports no chair alarm is needed at this time. ) Nurse Communication: Mobility status;Other (comment)(O2 sats) PT Visit Diagnosis: Muscle weakness (generalized) (M62.81);Difficulty in walking, not elsewhere classified (R26.2)    Time: 2800-3491 PT Time Calculation (min) (ACUTE ONLY): 38 min   Charges:   PT Evaluation $PT Eval Moderate Complexity: 1 Mod PT Treatments $Therapeutic Exercise: 8-22 mins       2:51 PM, 01/26/19 [name], PT, DPT Physical Therapist - [facility]  [phone] ([place])     [place] C 01/26/2019, 2:33 PM

## 2019-01-26 NOTE — Progress Notes (Signed)
[facility] [place] NAME: Darius Taylor    MR#:  [id number]  DATE OF BIRTH:  [birth year]  SUBJECTIVE:   Patient here due to weakness, shortness of breath and noted to be in congestive heart failure.  Feels a little bit better since yesterday.  Recently discharged from the hospital and now returns back with similar symptoms.  REVIEW OF SYSTEMS:    Review of Systems  Constitutional: Negative for chills and fever.  HENT: Negative for congestion and tinnitus.   Eyes: Negative for blurred vision and double vision.  Respiratory: Positive for shortness of breath. Negative for cough and wheezing.   Cardiovascular: Negative for chest pain, orthopnea and PND.  Gastrointestinal: Negative for abdominal pain, diarrhea, nausea and vomiting.  Genitourinary: Negative for dysuria and hematuria.  Neurological: Positive for weakness (generalized). Negative for dizziness, sensory change and focal weakness.  All other systems reviewed and are negative.   Nutrition: Heart Healthy Tolerating Diet: Yes Tolerating PT: Await Eval.    DRUG ALLERGIES:  No Known Allergies  VITALS:  Blood pressure (!) 124/54, pulse 64, temperature 97.8 F (36.6 C), temperature source Oral, resp. rate 20, height 5\' 11"  (1.803 m), weight 96.7 kg, SpO2 96 %.  PHYSICAL EXAMINATION:   Physical Exam  GENERAL:  73 y.o.-year-old obese patient lying in bed in no acute distress.  EYES: Pupils equal, round, reactive to light and accommodation. No scleral icterus. Extraocular muscles intact.  HEENT: Head atraumatic, normocephalic. Oropharynx and nasopharynx clear.  NECK:  Supple, no jugular venous distention. No thyroid enlargement, no tenderness.  LUNGS: Normal breath sounds bilaterally, no wheezing, rales, rhonchi. No use of accessory muscles of respiration.  CARDIOVASCULAR: S1, S2 normal. No murmurs, rubs, or gallops.  ABDOMEN: Soft, nontender, nondistended. Bowel sounds present.  No organomegaly or mass.  EXTREMITIES: No cyanosis, clubbing, + 1 edema b/l.     NEUROLOGIC: Cranial nerves II through XII are intact. No focal Motor or sensory deficits b/l. Globally weak.   PSYCHIATRIC: The patient is alert and oriented x 3.  SKIN: No obvious rash, lesion, or ulcer.    LABORATORY PANEL:   CBC Recent Labs  Lab 01/25/19 1435  WBC 7.0  HGB 7.5*  HCT 25.2*  PLT 212   ------------------------------------------------------------------------------------------------------------------  Chemistries  Recent Labs  Lab 01/26/19 0406  NA 143  K 3.7  CL 106  CO2 29  GLUCOSE 79  BUN 40*  CREATININE 1.66*  CALCIUM 8.3*  MG 2.3   ------------------------------------------------------------------------------------------------------------------  Cardiac Enzymes Recent Labs  Lab 01/25/19 2339  TROPONINI 0.10*   ------------------------------------------------------------------------------------------------------------------  RADIOLOGY:  Dg Chest Portable 1 View  Result Date: 01/25/2019 CLINICAL DATA:  Dyspnea EXAM: PORTABLE CHEST 1 VIEW COMPARISON:  01/21/2019 chest radiograph. FINDINGS: Stable configuration of 2 lead left subclavian ICD. Stable cardiomediastinal silhouette with moderate cardiomegaly. Cardiac valvular prosthesis is in place. No pneumothorax. No pleural effusion. Mild-to-moderate pulmonary edema, slightly improved. Stable mild left lung base patchy opacity. IMPRESSION: 1. Mild-to-moderate congestive heart failure, slightly improved. 2. Stable mild patchy left lung base opacity, favor atelectasis. Electronically Signed   By: Ilona Sorrel M.D.   On: 01/25/2019 14:25     ASSESSMENT AND PLAN:   73 year old male with past medical history of glaucoma, history of nonischemic cardiomyopathy, diastolic congestive heart failure, history of colon cancer, COPD, renal cell carcinoma, atrial fibrillation, history of aortic stenosis who presented to the hospital due  to weakness, shortness of breath.  1.  Acute on chronic  diastolic CHF-source of patient's worsening shortness of breath and weakness. - Patient was recently discharged from the hospital due to similar reasons and returns back with worsening shortness of breath. - Continue diuresis with IV Lasix, follow I's and O's and daily weights.  Patient is improving. -Continue Toprol.  2.  Generalized weakness-secondary to deconditioning/underlying congestive heart failure. -Continue diuresis as mentioned above.  Will get physical therapy consult to assess mobility.  3.  Anemia- iron studies obtained today suggestive of iron deficiency. -No acute bleeding.  We will give 1 dose of IV Venofer today.  Follow hemoglobin.  4.  Essential hypertension- continue Toprol, Norvasc.    5.  History of atrial fibrillation-rate controlled.  Continue amiodarone, Toprol. -Continue Eliquis.  6.  Glaucoma- continue latanoprost eyedrops.  7. COPD - no acute exacerbation - cont. Spiriva.    All the records are reviewed and case discussed with Care Management/Social Worker. Management plans discussed with the patient, family and they are in agreement.  CODE STATUS: Full code  DVT Prophylaxis: Eliquis  TOTAL TIME TAKING CARE OF THIS PATIENT: 30 minutes.   POSSIBLE D/C IN 2-3 DAYS, DEPENDING ON CLINICAL CONDITION.   Henreitta Leber M.D on 01/26/2019 at 12:21 PM  Between 7am to 6pm - Pager - [phone]  After 6pm go to [email or web address] - Technical brewer [place] [facility]  Office  [phone]  CC: Primary care physician; [employer]

## 2019-01-26 NOTE — TOC Initial Note (Signed)
Transition of Care [facility]) - Initial/Assessment Note    Patient Details  Name: Darius Taylor MRN: [id number] Date of Birth: 1945-11-09  [facility]) CM/SW Contact:    Elza Rafter, RN Phone Number: 01/26/2019, 10:27 AM  Clinical Narrative:     Patient is from home readmitted with acute on chronic CHF.   Recently discharged on Wednesday of last week-readmitted Sunday with SOB and increased oxygen requirement.  Usually uses 2L oxygen PRN.  Patient was requiring 3L continuous and was weak even ambulating to the bathroom.  Would like a smaller portable O2; the one he has he has to plug in and it runs out of charge quickly and it is too big and bulking to pull around.  Referral to [facility] with [facility].  Open to [facility] for RN, PT and aide.  Notified Helene Kelp with [facility] that patient has been admitted; will notify when patient discharges.  Patient uses a walker at home PRN.  Current with [facility] PCP and obtains medications there without difficulty.  Drives occasionally; his daughter lives next door and she helps with transportation.    Geoffry Paradise, BSN, RN Care Manager [phone]             Expected Discharge Plan: [place] Barriers to Discharge: Continued Medical Work up   Patient Goals and CMS Choice Patient states their goals for this hospitalization and ongoing recovery are:: Go home and would like a portable oxygen packet that is not as big and bulky as the one he has. CMS Medicare.gov Compare Post Acute Care list provided to:: Patient Choice offered to / list presented to : Patient  Expected Discharge Plan and Services Expected Discharge Plan: [place]   Discharge Planning Services: CM Consult Post Acute Care Choice: [place] arrangements for the past 2 months: Single Family Home Expected Discharge Date: 01/21/19                         HH Arranged: RN, PT, Nurse's Aide [place] Agency: [facility]  [employer] Date [place]: 01/26/19 Time [place]: 78 Representative spoke with at [place]: [place] Arrangements/Services Living arrangements for the past 2 months: [place] Lives with:: Self Patient language and need for interpreter reviewed:: Yes Do you feel safe going back to the place where you live?: Yes          Current home services: Homehealth aide, Home PT, Home RN Criminal Activity/Legal Involvement Pertinent to Current Situation/Hospitalization: No - Comment as needed  Activities of Daily Living Home Assistive Devices/Equipment: None ADL Screening (condition at time of admission) Patient's cognitive ability adequate to safely complete daily activities?: Yes Is the patient deaf or have difficulty hearing?: Yes Does the patient have difficulty seeing, even when wearing glasses/contacts?: Yes Does the patient have difficulty concentrating, remembering, or making decisions?: Yes Patient able to express need for assistance with ADLs?: Yes Does the patient have difficulty dressing or bathing?: Yes Independently performs ADLs?: Yes (appropriate for developmental age) Does the patient have difficulty walking or climbing stairs?: Yes Weakness of Legs: Both Weakness of Arms/Hands: None  Permission Sought/Granted Permission sought to share information with : Facility Art therapist granted to share information with : Yes, Verbal Permission Granted     Permission granted to share info w AGENCY: [facility] at [facility]        Emotional  Assessment Appearance:: Appears stated age Attitude/Demeanor/Rapport: Gracious Affect (typically observed): Accepting Orientation: : Oriented to Self, Oriented to Place, Oriented to  Time, Oriented to Situation Alcohol / Substance Use: Not Applicable    Admission diagnosis:  Acute on chronic congestive heart failure, unspecified heart failure type [facility]) [I50.9] Patient  Active Problem List   Diagnosis Date Noted  . Diastolic CHF, acute on chronic ([place]) 01/25/2019  . CAD (coronary artery disease) 12/30/2018  . Acute on chronic renal failure ([place]) 12/30/2018  . Alcoholic intoxication without complication ([place]) 62/83/1517  . CHF exacerbation ([place]) 11/03/2018  . Chronic diastolic heart failure ([place]) 09/24/2018  . HTN (hypertension) 09/24/2018  . Acute on chronic respiratory failure with hypoxemia ([place]) 08/30/2018  . History of colon cancer   . COPD (chronic obstructive pulmonary disease) ([place]) 01/08/2018  . Cellulitis 11/18/2017  . Hematoma    PCP:  [facility], [place]:   [address], [address] [place] [address] Phone: [phone] Fax: [phone]     Social Determinants of Health (SDOH) Interventions    Readmission Risk Interventions Readmission Risk Prevention Plan 01/26/2019 01/21/2019 01/05/2019  Transportation Screening Complete Complete Complete  Medication Review Press photographer) Complete Complete Complete  PCP or Specialist appointment within 3-5 days of discharge Complete Complete Complete  HRI or Home Care Consult Complete Complete Complete  SW Recovery Care/Counseling Consult Not Complete - Patient refused  SW Consult Not Complete Comments N/A - -  Palliative Care Screening Not Applicable Not Applicable Not [place] Not Applicable Not Applicable Not Applicable  Some recent data might be hidden

## 2019-01-27 LAB — BASIC METABOLIC PANEL
Anion gap: 10 (ref 5–15)
BUN: 40 mg/dL — ABNORMAL HIGH (ref 8–23)
CO2: 27 mmol/L (ref 22–32)
Calcium: 8.3 mg/dL — ABNORMAL LOW (ref 8.9–10.3)
Chloride: 103 mmol/L (ref 98–111)
Creatinine, Ser: 1.72 mg/dL — ABNORMAL HIGH (ref 0.61–1.24)
GFR calc Af Amer: 44 mL/min — ABNORMAL LOW (ref >60)
GFR calc non Af Amer: 38 mL/min — ABNORMAL LOW (ref >60)
Glucose, Bld: 152 mg/dL — ABNORMAL HIGH (ref 70–99)
Potassium: 3.6 mmol/L (ref 3.5–5.1)
Sodium: 140 mmol/L (ref 135–145)

## 2019-01-27 MED ORDER — POTASSIUM CHLORIDE CRYS ER 20 MEQ PO TBCR
20.0000 meq | EXTENDED_RELEASE_TABLET | Freq: Every day | ORAL | Status: DC
  Administered 2019-01-27 – 2019-01-30 (×4): 20 meq via ORAL
  Filled 2019-01-27 (×4): qty 1

## 2019-01-27 MED ORDER — ALPRAZOLAM 0.25 MG PO TABS
0.2500 mg | ORAL_TABLET | Freq: Three times a day (TID) | ORAL | Status: DC | PRN
  Administered 2019-01-27 – 2019-01-28 (×2): 0.25 mg via ORAL
  Filled 2019-01-27 (×2): qty 1

## 2019-01-27 NOTE — Plan of Care (Signed)
  Problem: Activity: Goal: Will identify at least one activity in which they can participate Outcome: Progressing   Problem: Coping: Goal: Ability to identify and develop effective coping behavior will improve Outcome: Progressing Goal: Ability to interact with others will improve Outcome: Progressing Goal: Demonstration of participation in decision-making regarding own care will improve Outcome: Progressing Goal: Ability to use eye contact when communicating with others will improve Outcome: Progressing   Problem: Health Behavior/Discharge Planning: Goal: Identification of resources available to assist in meeting health care needs will improve Outcome: Progressing   Problem: Self-Concept: Goal: Will verbalize positive feelings about self Outcome: Progressing   Problem: Education: Goal: Knowledge of General Education information will improve Description Including pain rating scale, medication(s)/side effects and non-pharmacologic comfort measures Outcome: Progressing   Problem: Health Behavior/Discharge Planning: Goal: Ability to manage health-related needs will improve Outcome: Progressing   Problem: Clinical Measurements: Goal: Ability to maintain clinical measurements within normal limits will improve Outcome: Progressing Goal: Will remain free from infection Outcome: Progressing Goal: Diagnostic test results will improve Outcome: Progressing Goal: Respiratory complications will improve Outcome: Progressing Goal: Cardiovascular complication will be avoided Outcome: Progressing   Problem: Activity: Goal: Risk for activity intolerance will decrease Outcome: Progressing   Problem: Nutrition: Goal: Adequate nutrition will be maintained Outcome: Progressing   Problem: Safety: Goal: Ability to remain free from injury will improve Outcome: Progressing   Problem: Education: Goal: Ability to demonstrate management of disease process will improve Outcome:  Progressing Goal: Ability to verbalize understanding of medication therapies will improve Outcome: Progressing Goal: Individualized Educational Video(s) Outcome: Progressing   Problem: Activity: Goal: Capacity to carry out activities will improve Outcome: Progressing   Problem: Cardiac: Goal: Ability to achieve and maintain adequate cardiopulmonary perfusion will improve Outcome: Progressing

## 2019-01-27 NOTE — Progress Notes (Signed)
Physical Therapy Treatment Patient Details Name: Darius Taylor MRN: [id number] DOB: Jan 26, 1946 Today's Date: 01/27/2019    History of Present Illness Darius Taylor is a 73yo male who comes to [facility] on 5/3 with acutely worse SOB. Pt was just recently DC from this site after 3+week admission adn similar presentation. PMH: AF on eliquis, CAD, NICM c EF 25%, AICD and PPM, CKD, colonic adenocarcinoma s/p partial colectomy, renal cell carcinoma s/p Left nephrectomy, COPD, CRF on home O2 2-3L/min. Pt also noted to have increased BNP upon admission and elevated troponin (0.11) noted to be baseline phenomenon.     PT Comments    Pt did well with PT session showing good effort with ambulation and seated exercises after brief rest break post ambulation.  He was on 3L O2 on arrival with sats in the low 90s, dropped to mid 80s relatively quickly during ambulation and bumped to 4L briefly with standing rest break and increase back to 90s for longer ambulation (~125 ft in total with RW).  Pt able to maintain low 90s on 3L back in sitting.  Pt still feeling weak and needing regular cuing for nasal and pursed lip breathing, relatively safe with mobility/ambulation using walker.     Follow Up Recommendations  Home health PT;Supervision - Intermittent     Equipment Recommendations  None recommended by PT    Recommendations for Other Services       Precautions / Restrictions Precautions Precautions: Fall Restrictions Weight Bearing Restrictions: No    Mobility  Bed Mobility Overal bed mobility: Independent             General bed mobility comments: Pt able to get himself to EOB w/o issue  Transfers Overall transfer level: Modified independent Equipment used: Rolling walker (2 wheeled) Transfers: Sit to/from Stand Sit to Stand: Modified independent (Device/Increase time)         General transfer comment: Pt did not have dizziness this date with getting to standing, showed good effort and  confidence using walker  Ambulation/Gait Ambulation/Gait assistance: Supervision Gait Distance (Feet): 125 Feet Assistive device: Rolling walker (2 wheeled)       General Gait Details: 3-4 liters O2 during ambulation.  He initially dropped from low 90s to mid 80s with ~40 ft of ambulation.  No LOBs or excessive subjective fatigue, but clearly with some fatigue and increased walker reliance in this brief bout.  With cues for focused nasal breathing and brief standing rest break sats came back to 90 and he was able to do more walking with reasonable effort.     Stairs             Wheelchair Mobility    Modified Rankin (Stroke Patients Only)       Balance Overall balance assessment: Modified Independent                                          Cognition Arousal/Alertness: Awake/alert Behavior During Therapy: WFL for tasks assessed/performed Overall Cognitive Status: Within Functional Limits for tasks assessed                                        Exercises General Exercises - Lower Extremity Ankle Circles/Pumps: AROM;10 reps Long Arc Quad: Strengthening;15 reps;Both Heel Slides: Strengthening;15 reps;Both Hip ABduction/ADduction: Standing;Strengthening;10 reps;Both  Hip Flexion/Marching: Strengthening;Both;10 reps;Standing    General Comments        Pertinent Vitals/Pain Pain Assessment: No/denies pain    Home Living                      Prior Function            PT Goals (current goals can now be found in the care plan section) Progress towards PT goals: Progressing toward goals    Frequency    Min 2X/week      PT Plan Current plan remains appropriate    Co-evaluation              AM-PAC PT "6 Clicks" Mobility   Outcome Measure  Help needed turning from your back to your side while in a flat bed without using bedrails?: None Help needed moving from lying on your back to sitting on the side of  a flat bed without using bedrails?: None Help needed moving to and from a bed to a chair (including a wheelchair)?: None Help needed standing up from a chair using your arms (e.g., wheelchair or bedside chair)?: None Help needed to walk in hospital room?: A Little Help needed climbing 3-5 steps with a railing? : A Little 6 Click Score: 22    End of Session Equipment Utilized During Treatment: Gait belt;Oxygen Activity Tolerance: Patient limited by fatigue Patient left: with chair alarm set;with call bell/phone within reach   PT Visit Diagnosis: Muscle weakness (generalized) (M62.81);Difficulty in walking, not elsewhere classified (R26.2)     Time: 1146-4314 PT Time Calculation (min) (ACUTE ONLY): 32 min  Charges:  $Gait Training: 8-22 mins $Therapeutic Exercise: 8-22 mins                     Kreg Shropshire, DPT 01/27/2019, 11:51 AM

## 2019-01-27 NOTE — TOC Progression Note (Signed)
Transition of Care [facility]) - Progression Note    Patient Details  Name: ARMONTE TORTORELLA MRN: [id number] Date of Birth: April 30, 1946  Transition of Care [facility]) CM/SW Contact  Elza Rafter, RN Phone Number: 01/27/2019, 1:39 PM  Clinical Narrative:   Damaris Schooner with Brad with [place] and asked him to come speak with patient again about oxygen DME.  Leroy Sea states as Mr. Overley is a mouth breather and on continuous oxygen the backpack portable O2 will not be sufficient for his O2 needs as it is a pulse flow device.  The battery lasts about 30 mins.  Mr. Colledge has a small potable concentrator at home that he can take out and about with him and the battery life is 3 hours.  It does ned to be plugged in to charge.  Brad spoke with patient and patient verbalizes understanding.  Leroy Sea is going to bring patient more tubing so that he can walk from room to room without having to carry the device.  Information given to patient for Meals on Wheels.      Expected Discharge Plan: [place] Barriers to Discharge: Continued Medical Work up  Expected Discharge Plan and Services Expected Discharge Plan: [place]   Discharge Planning Services: CM Consult Post Acute Care Choice: [place] arrangements for the past 2 months: Single Family Home Expected Discharge Date: 01/21/19                         HH Arranged: Social Work [place] Agency: [facility] [employer] Date [place]: 01/26/19 Time [place]: 1026 Representative spoke with at [place]: [place] ([place]) Interventions    Readmission Risk Interventions Readmission Risk Prevention Plan 01/26/2019 01/21/2019 01/05/2019  Transportation Screening Complete Complete Complete  Medication Review Press photographer) Complete Complete Complete  PCP or Specialist appointment within 3-5 days of discharge Complete Complete Complete  HRI or  Home Care Consult Complete Complete Complete  SW Recovery Care/Counseling Consult Not Complete - Patient refused  SW Consult Not Complete Comments N/A - -  Palliative Care Screening Not Applicable Not Applicable Not [place] Not Applicable Not Applicable Not Applicable  Some recent data might be hidden

## 2019-01-27 NOTE — Progress Notes (Signed)
[facility] [place] NAME: Darius Taylor    MR#:  [id number]  DATE OF BIRTH:  18-Aug-1946  SUBJECTIVE:   No acute events overnight, shortness of breath weakness improved.  Patient diuresing with IV Lasix.  Creatinine is stable.  REVIEW OF SYSTEMS:    Review of Systems  Constitutional: Negative for chills and fever.  HENT: Negative for congestion and tinnitus.   Eyes: Negative for blurred vision and double vision.  Respiratory: Positive for shortness of breath. Negative for cough and wheezing.   Cardiovascular: Negative for chest pain, orthopnea and PND.  Gastrointestinal: Negative for abdominal pain, diarrhea, nausea and vomiting.  Genitourinary: Negative for dysuria and hematuria.  Neurological: Positive for weakness (generalized). Negative for dizziness, sensory change and focal weakness.  All other systems reviewed and are negative.   Nutrition: Heart Healthy Tolerating Diet: Yes Tolerating PT: Eval noted.  DRUG ALLERGIES:  No Known Allergies  VITALS:  Blood pressure 112/70, pulse (!) 59, temperature 98 F (36.7 C), temperature source Oral, resp. rate 17, height 5\' 11"  (1.803 m), weight 95 kg, SpO2 92 %.  PHYSICAL EXAMINATION:   Physical Exam  GENERAL:  73 y.o.-year-old obese patient lying in bed in no acute distress.  EYES: Pupils equal, round, reactive to light and accommodation. No scleral icterus. Extraocular muscles intact.  HEENT: Head atraumatic, normocephalic. Oropharynx and nasopharynx clear.  NECK:  Supple, no jugular venous distention. No thyroid enlargement, no tenderness.  LUNGS: Normal breath sounds bilaterally, no wheezing, rales, rhonchi. No use of accessory muscles of respiration.  CARDIOVASCULAR: S1, S2 normal. No murmurs, rubs, or gallops.  ABDOMEN: Soft, nontender, nondistended. Bowel sounds present. No organomegaly or mass.  EXTREMITIES: No cyanosis, clubbing, + 1 edema b/l.     NEUROLOGIC: Cranial  nerves II through XII are intact. No focal Motor or sensory deficits b/l. Globally weak.   PSYCHIATRIC: The patient is alert and oriented x 3.  SKIN: No obvious rash, lesion, or ulcer.    LABORATORY PANEL:   CBC Recent Labs  Lab 01/25/19 1435  WBC 7.0  HGB 7.5*  HCT 25.2*  PLT 212   ------------------------------------------------------------------------------------------------------------------  Chemistries  Recent Labs  Lab 01/26/19 0406 01/27/19 0553  NA 143 140  K 3.7 3.6  CL 106 103  CO2 29 27  GLUCOSE 79 152*  BUN 40* 40*  CREATININE 1.66* 1.72*  CALCIUM 8.3* 8.3*  MG 2.3  --    ------------------------------------------------------------------------------------------------------------------  Cardiac Enzymes Recent Labs  Lab 01/25/19 2339  TROPONINI 0.10*   ------------------------------------------------------------------------------------------------------------------  RADIOLOGY:  Dg Chest Portable 1 View  Result Date: 01/25/2019 CLINICAL DATA:  Dyspnea EXAM: PORTABLE CHEST 1 VIEW COMPARISON:  01/21/2019 chest radiograph. FINDINGS: Stable configuration of 2 lead left subclavian ICD. Stable cardiomediastinal silhouette with moderate cardiomegaly. Cardiac valvular prosthesis is in place. No pneumothorax. No pleural effusion. Mild-to-moderate pulmonary edema, slightly improved. Stable mild left lung base patchy opacity. IMPRESSION: 1. Mild-to-moderate congestive heart failure, slightly improved. 2. Stable mild patchy left lung base opacity, favor atelectasis. Electronically Signed   By: Ilona Sorrel M.D.   On: 01/25/2019 14:25     ASSESSMENT AND PLAN:   73 year old male with past medical history of glaucoma, history of nonischemic cardiomyopathy, diastolic congestive heart failure, history of colon cancer, COPD, renal cell carcinoma, atrial fibrillation, history of aortic stenosis who presented to the hospital due to weakness, shortness of breath.  1.  Acute  on chronic diastolic CHF-source of patient's worsening shortness of  breath and weakness. - Patient was recently discharged from the hospital due to similar reasons and returns back with worsening shortness of breath. -Continue diuresis with IV Lasix, patient is responding well.  Continue Toprol.  Will likely need 1 more day of IV diuresis.  Wean off oxygen as tolerated.  2.  Generalized weakness-secondary to deconditioning/underlying congestive heart failure. -Continue diuresis as mentioned above. .  - Appreciate PT evaluation and will arrange home health upon discharge.  3.  Anemia- iron studies obtained today suggestive of iron deficiency. -Patient given 1 dose of IV Venofer yesterday.  We will continue to follow hemoglobin.  No acute bleeding.  4.  Essential hypertension- continue Toprol, Norvasc.    5.  History of atrial fibrillation-rate controlled.  Continue amiodarone, Toprol. -Continue Eliquis.  6.  Glaucoma- continue latanoprost eyedrops.  7. COPD - no acute exacerbation - cont. Spiriva.    Likely discharge home tomorrow with home health services.  All the records are reviewed and case discussed with Care Management/Social Worker. Management plans discussed with the patient, family and they are in agreement.  CODE STATUS: Full code  DVT Prophylaxis: Eliquis  TOTAL TIME TAKING CARE OF THIS PATIENT: 30 minutes.   POSSIBLE D/C tomorrow, DEPENDING ON CLINICAL CONDITION.   Henreitta Leber M.D on 01/27/2019 at 1:13 PM  Between 7am to 6pm - Pager - [phone]  After 6pm go to [email or web address] - Technical brewer [facility]  Office  [phone]  CC: Primary care physician; [employer]

## 2019-01-27 NOTE — Progress Notes (Signed)
Ch visited w/ pt to see how he was progressing. Pt shared that he had to be readmitted b/c he was experiencing the same issues- SOB. Ch understood and asked if he had H-H in place. Pt shared that he did but was concerned that he did not hv someone that could be there longer. Ch allowed room for the pt to lament about his concerns and lack of caregivers, but also aware of the limitations for his options. Ch ended visit to allow another care provider time w/ pt.  No further needs at this time.      01/27/19 1100  Clinical Encounter Type  Visited With Patient  Visit Type Follow-up;Psychological support;Spiritual support;Social support  Spiritual Encounters  Spiritual Needs Emotional;Grief support  Stress Factors  Patient Stress Factors Exhausted;Health changes;Major life changes;Lack of caregivers  Family Stress Factors None identified

## 2019-01-27 NOTE — TOC Progression Note (Signed)
Transition of Care [facility]) - Progression Note    Patient Details  Name: TAKUMI DIN MRN: [id number] Date of Birth: [birth year]  Transition of Care [facility]) CM/SW Contact  Elza Rafter, RN Phone Number: 01/27/2019, 8:07 AM  Clinical Narrative:   Care management order placed overnight for smaller portable O2 and SNF.  Brad with [facility] spoke with patient yesterday about a smaller portable oxygen tank.  After their conversation Mr. Havlin is going to keep what he has as the smaller back pack tank will not last long enough for patient as his oxygen requirements have increased.  PT is still recommending home health PT and patient would like to go home with home health.      Expected Discharge Plan: [place] Barriers to Discharge: Continued Medical Work up  Expected Discharge Plan and Services Expected Discharge Plan: [place]   Discharge Planning Services: CM Consult Post Acute Care Choice: [place] arrangements for the past 2 months: Single Family Home Expected Discharge Date: 01/21/19                         HH Arranged: RN, PT, Nurse's Aide [place] Agency: [facility] [employer] Date [place]: 01/26/19 Time [place]: 1026 Representative spoke with at [place]: [place] ([place]) Interventions    Readmission Risk Interventions Readmission Risk Prevention Plan 01/26/2019 01/21/2019 01/05/2019  Transportation Screening Complete Complete Complete  Medication Review Press photographer) Complete Complete Complete  PCP or Specialist appointment within 3-5 days of discharge Complete Complete Complete  HRI or Home Care Consult Complete Complete Complete  SW Recovery Care/Counseling Consult Not Complete - Patient refused  SW Consult Not Complete Comments N/A - -  Palliative Care Screening Not Applicable Not Applicable Not [place]  Not Applicable Not Applicable Not Applicable  Some recent data might be hidden

## 2019-01-28 ENCOUNTER — Inpatient Hospital Stay: Payer: Medicare HMO

## 2019-01-28 LAB — BASIC METABOLIC PANEL
Anion gap: 8 (ref 5–15)
BUN: 36 mg/dL — ABNORMAL HIGH (ref 8–23)
CO2: 29 mmol/L (ref 22–32)
Calcium: 8.5 mg/dL — ABNORMAL LOW (ref 8.9–10.3)
Chloride: 107 mmol/L (ref 98–111)
Creatinine, Ser: 1.7 mg/dL — ABNORMAL HIGH (ref 0.61–1.24)
GFR calc Af Amer: 45 mL/min — ABNORMAL LOW (ref >60)
GFR calc non Af Amer: 39 mL/min — ABNORMAL LOW (ref >60)
Glucose, Bld: 82 mg/dL (ref 70–99)
Potassium: 4 mmol/L (ref 3.5–5.1)
Sodium: 144 mmol/L (ref 135–145)

## 2019-01-28 LAB — CBC
HCT: 25.1 % — ABNORMAL LOW (ref 39.0–52.0)
Hemoglobin: 7.4 g/dL — ABNORMAL LOW (ref 13.0–17.0)
MCH: 29.1 pg (ref 26.0–34.0)
MCHC: 29.5 g/dL — ABNORMAL LOW (ref 30.0–36.0)
MCV: 98.8 fL (ref 80.0–100.0)
Platelets: 207 10*3/uL (ref 150–400)
RBC: 2.54 MIL/uL — ABNORMAL LOW (ref 4.22–5.81)
RDW: 15.9 % — ABNORMAL HIGH (ref 11.5–15.5)
WBC: 7.7 10*3/uL (ref 4.0–10.5)
nRBC: 0 % (ref 0.0–0.2)

## 2019-01-28 LAB — PREPARE RBC (CROSSMATCH)

## 2019-01-28 MED ORDER — FUROSEMIDE 10 MG/ML IJ SOLN: 40.0000 mg | Freq: Four times a day (QID) | INTRAMUSCULAR | Status: DC

## 2019-01-28 MED ORDER — ALBUTEROL SULFATE (2.5 MG/3ML) 0.083% IN NEBU
2.5000 mg | INHALATION_SOLUTION | RESPIRATORY_TRACT | Status: DC | PRN
  Administered 2019-01-28 – 2019-01-29 (×2): 2.5 mg via RESPIRATORY_TRACT
  Filled 2019-01-28 (×2): qty 3

## 2019-01-28 MED ORDER — FUROSEMIDE 10 MG/ML IJ SOLN
40.0000 mg | Freq: Three times a day (TID) | INTRAMUSCULAR | Status: DC
  Administered 2019-01-28 – 2019-01-30 (×6): 40 mg via INTRAVENOUS
  Filled 2019-01-28 (×6): qty 4

## 2019-01-28 MED ORDER — FUROSEMIDE 10 MG/ML IJ SOLN
INTRAMUSCULAR | Status: AC
  Filled 2019-01-28: qty 4

## 2019-01-28 MED ORDER — SODIUM CHLORIDE 0.9% IV SOLUTION
Freq: Once | INTRAVENOUS | Status: AC
  Administered 2019-01-28: 17:00:00 via INTRAVENOUS

## 2019-01-28 MED ORDER — FUROSEMIDE 10 MG/ML IJ SOLN: 80.0000 mg | Freq: Once | INTRAMUSCULAR | Status: DC

## 2019-01-28 NOTE — Progress Notes (Signed)
PT Cancellation Note  Patient Details Name: Darius Taylor MRN: [id number] DOB: 1946/02/16   Cancelled Treatment:    Reason Eval/Treat Not Completed: Patient declined, no reason specified Patient resting this afternoon, says he will work with PT tomorrow.   Shelton Silvas 01/28/2019, 4:34 PM

## 2019-01-28 NOTE — Progress Notes (Signed)
[facility] at [place] NAME: Darius Taylor    MR#:  [id number]  DATE OF BIRTH:  04-Dec-1945  SUBJECTIVE:   Patient still complaining of shortness of breath and hypoxemia on minimal exertion.  Chest x-ray this morning still showing evidence of CHF.  Patient remains on IV diuresis.  No other complaints presently.  REVIEW OF SYSTEMS:    Review of Systems  Constitutional: Negative for chills and fever.  HENT: Negative for congestion and tinnitus.   Eyes: Negative for blurred vision and double vision.  Respiratory: Positive for shortness of breath. Negative for cough and wheezing.   Cardiovascular: Negative for chest pain, orthopnea and PND.  Gastrointestinal: Negative for abdominal pain, diarrhea, nausea and vomiting.  Genitourinary: Negative for dysuria and hematuria.  Neurological: Positive for weakness (generalized). Negative for dizziness, sensory change and focal weakness.  All other systems reviewed and are negative.   Nutrition: Heart Healthy Tolerating Diet: Yes Tolerating PT: Await Eval.    DRUG ALLERGIES:  No Known Allergies  VITALS:  Blood pressure (!) 156/72, pulse 60, temperature 98.2 F (36.8 C), temperature source Oral, resp. rate 18, height 5\' 11"  (1.803 m), weight 97.2 kg, SpO2 90 %.  PHYSICAL EXAMINATION:   Physical Exam  GENERAL:  73 year old obese patient lying in bed in mild Resp. Distress.   EYES: Pupils equal, round, reactive to light and accommodation. No scleral icterus. Extraocular muscles intact.  HEENT: Head atraumatic, normocephalic. Oropharynx and nasopharynx clear.  NECK:  Supple, no jugular venous distention. No thyroid enlargement, no tenderness.  LUNGS: Normal breath sounds bilaterally, no wheezing, rales, rhonchi. + use of accessory muscles of respiration.  CARDIOVASCULAR: S1, S2 normal. No murmurs, rubs, or gallops.  ABDOMEN: Soft, nontender, nondistended. Bowel sounds present. No  organomegaly or mass.  EXTREMITIES: No cyanosis, clubbing, + 1 edema b/l.     NEUROLOGIC: Cranial nerves II through XII are intact. No focal Motor or sensory deficits b/l. Globally weak.   PSYCHIATRIC: The patient is alert and oriented x 3.  SKIN: No obvious rash, lesion, or ulcer.    LABORATORY PANEL:   CBC Recent Labs  Lab 01/28/19 0307  WBC 7.7  HGB 7.4*  HCT 25.1*  PLT 207   ------------------------------------------------------------------------------------------------------------------  Chemistries  Recent Labs  Lab 01/26/19 0406  01/28/19 0307  NA 143   < > 144  K 3.7   < > 4.0  CL 106   < > 107  CO2 29   < > 29  GLUCOSE 79   < > 82  BUN 40*   < > 36*  CREATININE 1.66*   < > 1.70*  CALCIUM 8.3*   < > 8.5*  MG 2.3  --   --    < > = values in this interval not displayed.   ------------------------------------------------------------------------------------------------------------------  Cardiac Enzymes Recent Labs  Lab 01/25/19 2339  TROPONINI 0.10*   ------------------------------------------------------------------------------------------------------------------  RADIOLOGY:  Dg Chest Port 1 View  Result Date: 01/28/2019 CLINICAL DATA:  Cough and shortness of breath for 1 day EXAM: PORTABLE CHEST 1 VIEW COMPARISON:  Three days ago FINDINGS: Dual-chamber ICD/pacer leads from the left that are stable. Cardiomegaly. Transcatheter aortic valve replacement. Generalized interstitial coarsening. There is mild airspace opacity at the right upper lobe, unchanged. IMPRESSION: Cardiomegaly and vascular congestion without convincing change from prior. There may be superimposed airspace disease. Electronically Signed   By: Monte Fantasia M.D.   On: 01/28/2019 07:07  ASSESSMENT AND PLAN:   73 year old male with past medical history of glaucoma, history of nonischemic cardiomyopathy, diastolic congestive heart failure, history of colon cancer, COPD, renal cell  carcinoma, atrial fibrillation, history of aortic stenosis who presented to the hospital due to weakness, shortness of breath.  1.  Acute on chronic diastolic CHF-source of patient's worsening shortness of breath and weakness. - Patient was recently discharged from the hospital due to similar reasons and returns back with worsening shortness of breath. -Patient continues to have significant shortness of breath on minimal exertion.  Continue diuresis with IV Lasix.  Chest x-ray still showing evidence of vascular congestion.  Follow I's and O's and daily weights. -Continue Toprol.  2.  Generalized weakness-secondary to deconditioning/underlying congestive heart failure. -Continue diuresis as mentioned above.   Seen by physical therapy and the recommend home health services which will be arranged prior to discharge.  3.  Anemia- iron studies obtained today suggestive of iron deficiency. Patient has no acute bleeding.  He is quite symptomatic with shortness of breath and his anemia could be contributing to this.  Patient was given IV Venofer 2 days ago, will transfuse 1 unit of packed red blood cells today.  Follow hemoglobin.  4.  Essential hypertension- continue Toprol, Norvasc.    5.  History of atrial fibrillation-rate controlled.  Continue amiodarone, Toprol. -Continue Eliquis.  6.  Glaucoma- continue latanoprost eyedrops.  7. COPD - no acute exacerbation - cont. Spiriva.    All the records are reviewed and case discussed with Care Management/Social Worker. Management plans discussed with the patient, family and they are in agreement.  CODE STATUS: Full code  DVT Prophylaxis: Eliquis  TOTAL TIME TAKING CARE OF THIS PATIENT: 30 minutes.   POSSIBLE D/C IN 1-2 DAYS, DEPENDING ON CLINICAL CONDITION.   Henreitta Leber M.D on 01/28/2019 at 1:51 PM  Between 7am to 6pm - Pager - [phone]  After 6pm go to [email or web address] - Technical brewer [place] [facility]   Office  [phone]  CC: Primary care physician; [employer]

## 2019-01-28 NOTE — Progress Notes (Signed)
Blood transfusion completed at 2115. No adverse reactions noted. VSS. Pt has no concerns.

## 2019-01-28 NOTE — Progress Notes (Signed)
PRBC infusing started at 1720. VSS stable prior to initiation as well as 15 minutes in to infusion. Pt declined Dinner at this time; wants to wait until after infusion completed to eat. Will continue to monitor per protocol. CBC ordered for am.

## 2019-01-28 NOTE — Care Management Important Message (Signed)
Important Message  Patient Details  Name: SHAHID FLORI MRN: [id number] Date of Birth: 08/19/1946   Medicare Important Message Given:  Yes    Dannette Barbara 01/28/2019, 10:37 AM

## 2019-01-29 ENCOUNTER — Telehealth: Payer: Medicare HMO | Admitting: Family

## 2019-01-29 LAB — TYPE AND SCREEN
ABO/RH(D): O POS
Antibody Screen: NEGATIVE
Unit division: 0

## 2019-01-29 LAB — CBC
HCT: 28.1 % — ABNORMAL LOW (ref 39.0–52.0)
Hemoglobin: 8.6 g/dL — ABNORMAL LOW (ref 13.0–17.0)
MCH: 30.2 pg (ref 26.0–34.0)
MCHC: 30.6 g/dL (ref 30.0–36.0)
MCV: 98.6 fL (ref 80.0–100.0)
Platelets: 214 10*3/uL (ref 150–400)
RBC: 2.85 MIL/uL — ABNORMAL LOW (ref 4.22–5.81)
RDW: 17.2 % — ABNORMAL HIGH (ref 11.5–15.5)
WBC: 9.8 10*3/uL (ref 4.0–10.5)
nRBC: 0.3 % — ABNORMAL HIGH (ref 0.0–0.2)

## 2019-01-29 LAB — BASIC METABOLIC PANEL
Anion gap: 8 (ref 5–15)
BUN: 33 mg/dL — ABNORMAL HIGH (ref 8–23)
CO2: 30 mmol/L (ref 22–32)
Calcium: 8.6 mg/dL — ABNORMAL LOW (ref 8.9–10.3)
Chloride: 104 mmol/L (ref 98–111)
Creatinine, Ser: 1.71 mg/dL — ABNORMAL HIGH (ref 0.61–1.24)
GFR calc Af Amer: 45 mL/min — ABNORMAL LOW (ref >60)
GFR calc non Af Amer: 39 mL/min — ABNORMAL LOW (ref >60)
Glucose, Bld: 88 mg/dL (ref 70–99)
Potassium: 3.6 mmol/L (ref 3.5–5.1)
Sodium: 142 mmol/L (ref 135–145)

## 2019-01-29 LAB — BPAM RBC
Blood Product Expiration Date: 202005082359
ISSUE DATE / TIME: 202005061705
Unit Type and Rh: 9500

## 2019-01-29 NOTE — Plan of Care (Signed)
  Problem: Education: Goal: Knowledge of General Education information will improve Description Including pain rating scale, medication(s)/side effects and non-pharmacologic comfort measures Outcome: Progressing   Problem: Health Behavior/Discharge Planning: Goal: Ability to manage health-related needs will improve Outcome: Progressing   Problem: Clinical Measurements: Goal: Ability to maintain clinical measurements within normal limits will improve Outcome: Progressing Goal: Will remain free from infection Outcome: Progressing Note:  Remains afebrile Goal: Diagnostic test results will improve Outcome: Progressing Note:  1 unit PRBC's given yesterday, H&H this am 8.6/28.1 Goal: Respiratory complications will improve Outcome: Progressing Note:  Weaned pt down to 3 liters oxygen per [place] this morning Goal: Cardiovascular complication will be avoided Outcome: Progressing Note:  No arrhythmias overnight   Problem: Activity: Goal: Will identify at least one activity in which they can participate Outcome: Completed/Met   Problem: Coping: Goal: Ability to identify and develop effective coping behavior will improve Outcome: Completed/Met Goal: Ability to interact with others will improve Outcome: Completed/Met Goal: Demonstration of participation in decision-making regarding own care will improve Outcome: Completed/Met Goal: Ability to use eye contact when communicating with others will improve Outcome: Completed/Met   Problem: Health Behavior/Discharge Planning: Goal: Identification of resources available to assist in meeting health care needs will improve Outcome: Completed/Met   Problem: Self-Concept: Goal: Will verbalize positive feelings about self Outcome: Completed/Met

## 2019-01-29 NOTE — Progress Notes (Signed)
Amlodipine and Metoprolol held this am for BP 101/66.  Dr. Verdell Carmine aware.

## 2019-01-29 NOTE — Progress Notes (Signed)
PT Cancellation Note  Patient Details Name: Darius Taylor MRN: [id number] DOB: 1946-03-30   Cancelled Treatment:    Reason Eval/Treat Not Completed: Patient declined, no reason specified Patient requested to have a breathing treatment prior to PT. Will re-attempt PT treatment in the PM after breathing treatment. Thank you for this referral.    Trotter,MargaretPT, DPT 01/29/2019, 12:33 PM

## 2019-01-29 NOTE — Progress Notes (Signed)
[facility] at [place] NAME: Darius Taylor    MR#:  [id number]  DATE OF BIRTH:  [birth year]  SUBJECTIVE:   Patient still complaining of significant exertional dyspnea on exertion.  Did not work physical therapy again today.  Hemoglobin improved posttransfusion today.  No other acute events overnight.  REVIEW OF SYSTEMS:    Review of Systems  Constitutional: Negative for chills and fever.  HENT: Negative for congestion and tinnitus.   Eyes: Negative for blurred vision and double vision.  Respiratory: Positive for shortness of breath. Negative for cough and wheezing.   Cardiovascular: Negative for chest pain, orthopnea and PND.  Gastrointestinal: Negative for abdominal pain, diarrhea, nausea and vomiting.  Genitourinary: Negative for dysuria and hematuria.  Neurological: Positive for weakness (generalized). Negative for dizziness, sensory change and focal weakness.  All other systems reviewed and are negative.   Nutrition: Heart Healthy Tolerating Diet: Yes Tolerating PT: Await Eval.    DRUG ALLERGIES:  No Known Allergies  VITALS:  Blood pressure 101/66, pulse 60, temperature 98.3 F (36.8 C), resp. rate 17, height 5\' 11"  (1.803 m), weight 96.6 kg, SpO2 91 %.  PHYSICAL EXAMINATION:   Physical Exam  GENERAL:  73 y.o.-year-old obese patient lying in bed in mild Resp. Distress.   EYES: Pupils equal, round, reactive to light and accommodation. No scleral icterus. Extraocular muscles intact.  HEENT: Head atraumatic, normocephalic. Oropharynx and nasopharynx clear.  NECK:  Supple, no jugular venous distention. No thyroid enlargement, no tenderness.  LUNGS: Normal breath sounds bilaterally, no wheezing, rales, rhonchi. + use of accessory muscles of respiration.  CARDIOVASCULAR: S1, S2 normal. No murmurs, rubs, or gallops.  ABDOMEN: Soft, nontender, nondistended. Bowel sounds present. No organomegaly or mass.  EXTREMITIES: No cyanosis,  clubbing, + 1 edema b/l.     NEUROLOGIC: Cranial nerves II through XII are intact. No focal Motor or sensory deficits b/l. Globally weak.   PSYCHIATRIC: The patient is alert and oriented x 3.  SKIN: No obvious rash, lesion, or ulcer.    LABORATORY PANEL:   CBC Recent Labs  Lab 01/29/19 0502  WBC 9.8  HGB 8.6*  HCT 28.1*  PLT 214   ------------------------------------------------------------------------------------------------------------------  Chemistries  Recent Labs  Lab 01/26/19 0406  01/29/19 0502  NA 143   < > 142  K 3.7   < > 3.6  CL 106   < > 104  CO2 29   < > 30  GLUCOSE 79   < > 88  BUN 40*   < > 33*  CREATININE 1.66*   < > 1.71*  CALCIUM 8.3*   < > 8.6*  MG 2.3  --   --    < > = values in this interval not displayed.   ------------------------------------------------------------------------------------------------------------------  Cardiac Enzymes Recent Labs  Lab 01/25/19 2339  TROPONINI 0.10*   ------------------------------------------------------------------------------------------------------------------  RADIOLOGY:  Dg Chest Port 1 View  Result Date: 01/28/2019 CLINICAL DATA:  Cough and shortness of breath for 1 day EXAM: PORTABLE CHEST 1 VIEW COMPARISON:  Three days ago FINDINGS: Dual-chamber ICD/pacer leads from the left that are stable. Cardiomegaly. Transcatheter aortic valve replacement. Generalized interstitial coarsening. There is mild airspace opacity at the right upper lobe, unchanged. IMPRESSION: Cardiomegaly and vascular congestion without convincing change from prior. There may be superimposed airspace disease. Electronically Signed   By: Monte Fantasia M.D.   On: 01/28/2019 07:07     ASSESSMENT AND PLAN:   73 year old male with past  medical history of glaucoma, history of nonischemic cardiomyopathy, diastolic congestive heart failure, history of colon cancer, COPD, renal cell carcinoma, atrial fibrillation, history of aortic  stenosis who presented to the hospital due to weakness, shortness of breath.  1.  Acute on chronic diastolic CHF-source of patient's worsening shortness of breath and weakness. - Patient was recently discharged from the hospital due to similar reasons and returns back with worsening shortness of breath. -Patient continues to have significant shortness of breath on minimal exertion.  Continue diuresis with IV Lasix.  Chest x-ray still showing evidence of vascular congestion.  Follow I's and O's and daily weights. -Continue Toprol.   2.  Generalized weakness-secondary to deconditioning/underlying congestive heart failure. -Continue diuresis as mentioned above.   Seen by physical therapy and the recommend home health services which will be arranged prior to discharge.  3.  Anemia- iron studies obtained today suggestive of iron deficiency. Patient has no acute bleeding.   -Status post 1 dose of IV iron and given 1 unit of packed red blood cells yesterday.  Hemoglobin improved posttransfusion and will continue to monitor.   4.  Essential hypertension- continue Toprol, Norvasc.    5.  History of atrial fibrillation-rate controlled.  Continue amiodarone, Toprol. -Continue Eliquis.  6.  Glaucoma- continue latanoprost eyedrops.  7. COPD - no acute exacerbation - cont. Spiriva.    Await physical therapy evaluation and possible discharge within the next 24 to 48 hours.   All the records are reviewed and case discussed with Care Management/Social Worker. Management plans discussed with the patient, family and they are in agreement.  CODE STATUS: Full code  DVT Prophylaxis: Eliquis  TOTAL TIME TAKING CARE OF THIS PATIENT: 30 minutes.   POSSIBLE D/C IN 1-2 DAYS, DEPENDING ON CLINICAL CONDITION.   Henreitta Leber M.D on 01/29/2019 at 2:35 PM  Between 7am to 6pm - Pager - [phone]  After 6pm go to [email or web address] - Technical brewer [place] [facility]  Office   [phone]  CC: Primary care physician; [employer]

## 2019-01-29 NOTE — Progress Notes (Signed)
Physical Therapy Treatment Patient Details Name: Darius Taylor MRN: [id number] DOB: 1945/11/29 Today's Date: 01/29/2019    History of Present Illness Darius Taylor is a 73yo male who comes to [facility] on 5/3 with acutely worse SOB. Pt was just recently DC from this site after 3+week admission adn similar presentation. PMH: AF on eliquis, CAD, NICM c EF 25%, AICD and PPM, CKD, colonic adenocarcinoma s/p partial colectomy, renal cell carcinoma s/p Left nephrectomy, COPD, CRF on home O2 2-3L/min. Pt also noted to have increased BNP upon admission and elevated troponin (0.11) noted to be baseline phenomenon.     PT Comments    Patient is progressing fairly. He tolerated exercise well with good SPo2 noted following seated exercise. However with ambulation today, patient desats quickly while on 3-4L O2. He reports feeling minimal shortness of breath; unsure of accuracy of Spo2 levels as patients hands were cold and it did take a little while for Pulse Ox to read vitals. Patient does exhibit good safety awareness with RW being able to maneuver in tight positions. He would benefit from skilled PT intervention to improve strength and mobility; current plan remains appropriate.    Follow Up Recommendations  Home health PT;Supervision - Intermittent     Equipment Recommendations  None recommended by PT    Recommendations for Other Services       Precautions / Restrictions Precautions Precautions: Fall Restrictions Weight Bearing Restrictions: No    Mobility  Bed Mobility                  Transfers Overall transfer level: Modified independent Equipment used: Rolling walker (2 wheeled) Transfers: Sit to/from Stand Sit to Stand: Modified independent (Device/Increase time)         General transfer comment: exhibits good safety awareness when using RW with good sit<>Stand ability;   Ambulation/Gait Ambulation/Gait assistance: Supervision Gait Distance (Feet): 100 Feet Assistive  device: Rolling walker (2 wheeled) Gait Pattern/deviations: WFL(Within Functional Limits)     General Gait Details: pt on 4L O2 during ambulation nasal cannula; He was able to negotiate tight obstacles in room with RW with good safety awareness; Required min Vcs to improve purse lip breathing for better Spo2 management; following gait, Spo2 read at 78%, however unsure of accuracy as patient's hands are cold and it took a while for Pulse Ox to read vital signs; Patient is short of breath but reports minimal; After 2-3 min, Spo2 back in low 90's. Patient denies any discomfort or dizziness.    Stairs             Wheelchair Mobility    Modified Rankin (Stroke Patients Only)       Balance Overall balance assessment: Modified Independent                                          Cognition Arousal/Alertness: Awake/alert Behavior During Therapy: WFL for tasks assessed/performed Overall Cognitive Status: Within Functional Limits for tasks assessed                                        Exercises General Exercises - Lower Extremity Long Arc Quad: AROM;Strengthening;Right;Left;10 reps;Seated Hip ABduction/ADduction: AROM;Strengthening;Right;Left;10 reps;Seated Hip Flexion/Marching: AROM;Strengthening;Right;Left;10 reps;Seated Toe Raises: AROM;Strengthening;Right;Left;10 reps;Seated Heel Raises: AROM;Strengthening;Right;Left;10 reps;Seated Other Exercises Other Exercises: Patient instructed in seated  LE strengthening exercise. Required min VCs to increase AROM and slow down LE movement for better strengthening; Patient mild short of breath during exercise, Spo2 90% Other Exercises: during ambulation patient required instruction for pursed lip breathing to improve SPo2 level;     General Comments        Pertinent Vitals/Pain Pain Assessment: No/denies pain    Home Living                      Prior Function            PT Goals  (current goals can now be found in the care plan section) Acute Rehab PT Goals Patient Stated Goal: have a smaller portable O2 concentrator that he can wear while moving.  PT Goal Formulation: With patient Time For Goal Achievement: 01/23/19 Potential to Achieve Goals: Good Progress towards PT goals: Progressing toward goals    Frequency    Min 2X/week      PT Plan Current plan remains appropriate    Co-evaluation              AM-PAC PT "6 Clicks" Mobility   Outcome Measure  Help needed turning from your back to your side while in a flat bed without using bedrails?: None Help needed moving from lying on your back to sitting on the side of a flat bed without using bedrails?: None Help needed moving to and from a bed to a chair (including a wheelchair)?: None Help needed standing up from a chair using your arms (e.g., wheelchair or bedside chair)?: None Help needed to walk in hospital room?: A Little Help needed climbing 3-5 steps with a railing? : A Little 6 Click Score: 22    End of Session Equipment Utilized During Treatment: Gait belt;Oxygen Activity Tolerance: Patient limited by fatigue Patient left: with chair alarm set;with call bell/phone within reach   PT Visit Diagnosis: Muscle weakness (generalized) (M62.81);Difficulty in walking, not elsewhere classified (R26.2)     Time: 0177-9390 PT Time Calculation (min) (ACUTE ONLY): 24 min  Charges:  $Therapeutic Exercise: 23-37 mins                        Trotter,Margaret PT, DPT 01/29/2019, 3:15 PM

## 2019-01-29 NOTE — Progress Notes (Signed)
Cardiovascular and Pulmonary Nurse Navigator Note:    Patient is a 73 year old male with history of chronic diastolic CHF, COPD, chronic hypoxic respiratory failure on 2 L of home oxygen therapy, hypertension, CKD 3 and proximal atrial fibrillation admitted for management of acute on chronic diastolic CHF exacerbation.  Echo performed in February 2020 patient's EF was 50 to 55%.    Rounded on patient.  Patient sitting up in chair with oxygen in use watching television.  Patient alert and oriented x 4.  CHF Education:   Note:  Heart Failure is not a new diagnosis for this patient.  Patient refused packet "Living Better with Heart Failure" booklet, HF Videos, and education by dietitian.  Patient reported he has a functioning scale and performs daily weights.  Patient recited approriately when to call the doctor.    Diet - Patient reported he does not add salt to his food and his daughter, who lives next door, is aware he is on a low sodium diet and prepares his food accordingly.    *Fluid Restriction - Reiterated to patient he is on a 1200 mL fluid restriction.  This RN demonstrated this volume using the bedside water pitcher.  Patient verbalized understanding.  ? ? *Instructed patient to take medications as prescribed for heart failure. Explained briefly why pt is on the medications (either make you feel better, live longer or keep you out of the hospital) and discussed monitoring and side effects.  ? *Discussed exercise / activity - Home Health PT has recommended by PT.   ? *Smoking Cessation- Patient is a former smoker.? ? *[facility] Heart Failure [facility] - Explained the purpose of the HF Clinic. ?Explained to patient the HF Clinic does not replace PCP nor Cardiologist, but is an additional resource to helping patient manage heart failure at home.  Patient stated he has been seen in this clinic before and is agreeable to being seen again.  Appointment scheduled for 02/05/2019 at 11:00 a.m.  Explained to  patient this would be a virtual appointment due to the COVID-19 pandemic.   ? Again, the 5 Steps to Living Better with Heart Failure were reviewed with patient. The Heart Failure.  ? Patient thanked me for providing the above information. ?? Roanna Epley, RN, BSN, [facility]? [place] [facility]  Cardiovascular &?Pulmonary Nurse Navigator  Direct Line: [phone]  Department Phone #: [phone] Fax: [phone]?

## 2019-01-30 LAB — BASIC METABOLIC PANEL
Anion gap: 9 (ref 5–15)
BUN: 35 mg/dL — ABNORMAL HIGH (ref 8–23)
CO2: 30 mmol/L (ref 22–32)
Calcium: 8.7 mg/dL — ABNORMAL LOW (ref 8.9–10.3)
Chloride: 103 mmol/L (ref 98–111)
Creatinine, Ser: 1.6 mg/dL — ABNORMAL HIGH (ref 0.61–1.24)
GFR calc Af Amer: 48 mL/min — ABNORMAL LOW (ref >60)
GFR calc non Af Amer: 42 mL/min — ABNORMAL LOW (ref >60)
Glucose, Bld: 88 mg/dL (ref 70–99)
Potassium: 3.4 mmol/L — ABNORMAL LOW (ref 3.5–5.1)
Sodium: 142 mmol/L (ref 135–145)

## 2019-01-30 MED ORDER — POTASSIUM CHLORIDE CRYS ER 20 MEQ PO TBCR
40.0000 meq | EXTENDED_RELEASE_TABLET | Freq: Once | ORAL | Status: AC
  Administered 2019-01-30: 40 meq via ORAL
  Filled 2019-01-30: qty 2

## 2019-01-30 NOTE — TOC Transition Note (Signed)
Transition of Care [facility]) - CM/SW Discharge Note   Patient Details  Name: Darius Taylor MRN: [id number] Date of Birth: 12/12/1945  [facility] CM/SW Contact:  Elza Rafter, RN Phone Number: 01/30/2019, 11:18 AM   Clinical Narrative:   Patient is discharging to home today.  His daughter is working until [employer].  He states she left a key under the mat.  Will arrange transport via EMS once RN or CM speaks with daughter to confirm.  This RNCM left message on daughters voicemail to call back.  [facility] aware of DC today to start [facility] PT, RN and SW.      Final next level of care: [place] Barriers to Discharge: No Barriers Identified   Patient Goals and CMS Choice Patient states their goals for this hospitalization and ongoing recovery are:: Go home and would like a portable oxygen packet that is not as big and bulky as the one he has. CMS Medicare.gov Compare Post Acute Care list provided to:: Patient Choice offered to / list presented to : Patient  Discharge Placement                       Discharge Plan and Services   Discharge Planning Services: CM Consult Post Acute Care Choice: Home Health                    HH Arranged: RN, PT, Social Work [place] Agency: [facility] Date [place]: 01/26/19 Time [place]: 1026 Representative spoke with at [place]: [place] ([place]) Interventions     Readmission Risk Interventions Readmission Risk Prevention Plan 01/26/2019 01/21/2019 01/05/2019  Transportation Screening Complete Complete Complete  Medication Review Press photographer) Complete Complete Complete  PCP or Specialist appointment within 3-5 days of discharge Complete Complete Complete  HRI or Home Care Consult Complete Complete Complete  SW Recovery Care/Counseling Consult Not Complete - Patient refused  SW Consult Not Complete Comments N/A - -  Palliative Care  Screening Not Applicable Not Applicable Not [place] Not Applicable Not Applicable Not Applicable  Some recent data might be hidden

## 2019-01-30 NOTE — Discharge Summary (Signed)
[place] at [place] NAME: Darius Taylor    MR#:  [id number]  DATE OF BIRTH:  11/07/1945  DATE OF ADMISSION:  01/25/2019 ADMITTING PHYSICIAN: Otila Back, MD  DATE OF DISCHARGE: 01/30/2019  PRIMARY CARE PHYSICIAN: [facility], [place]    ADMISSION DIAGNOSIS:  Acute on chronic congestive heart failure, unspecified heart failure type ([place]) [I50.9]  DISCHARGE DIAGNOSIS:  Active Problems:   Diastolic CHF, acute on chronic ([place])   SECONDARY DIAGNOSIS:   Past Medical History:  Diagnosis Date  . Aortic stenosis   . Arrhythmia    atrial fibrillation  . Asthma   . Atrial fibrillation ([place])   . CAD (coronary artery disease)   . Cancer [facility])    colon, bladder and kidney  . CHF (congestive heart failure) ([place])   . Colon adenocarcinoma ([place])   . COPD (chronic obstructive pulmonary disease) ([place])   . H/O ventricular tachycardia   . Hypertension   . Nonischemic cardiomyopathy ([place])    Status post AICD and pacemaker  . Renal cell carcinoma [facility])     HOSPITAL COURSE:   73 year old male with past medical history of glaucoma, history of nonischemic cardiomyopathy, diastolic congestive heart failure, history of colon cancer, COPD, renal cell carcinoma, atrial fibrillation, history of aortic stenosis who presented to the hospital due to weakness, shortness of breath.  1.  Acute on chronic diastolic CHF-source of patient's worsening shortness of breath and weakness. -Patient was diuresed with IV Lasix and responded well to it.  He has clinically improved.  He still is hypoxic but is chronically on oxygen. -Patient also had some anemia which was chronic and therefore was transfused 1 unit packed red blood cells which has improved his shortness of breath 2. -He will continue his home dose Lasix and Toprol as stated below. -Follow-up with heart failure clinic as outpatient.   2.  Generalized weakness-secondary to deconditioning/underlying  congestive heart failure. Improved with diuresis.  Seen by physical therapy and the recommend home health services which is being arranged for the patient prior to the discharge.  3.  Anemia- iron studies obtained suggestive of iron deficiency. Patient had no acute bleeding.   Patient received 1 dose of IV Venofer and also received 1 unit of packed red blood cells.  Hemoglobin has improved posttransfusion.  Remained stable.  This can be further followed as an outpatient.  4.  Essential hypertension- pt. will continue Toprol, Norvasc.    5.  History of atrial fibrillation-rate controlled.  pt. Will Continue amiodarone, Toprol. -pt. Will Continue Eliquis.  6.  Glaucoma- continue latanoprost eyedrops.  7. COPD - no acute exacerbation - pt. Will cont. Spiriva.    Patient is stable to be discharged home with home health services.  DISCHARGE CONDITIONS:   Stable  CONSULTS OBTAINED:    DRUG ALLERGIES:  No Known Allergies  DISCHARGE MEDICATIONS:   Allergies as of 01/30/2019   No Known Allergies     Medication List    TAKE these medications   acetaminophen 325 MG tablet Commonly known as:  TYLENOL Take 650 mg by mouth every 6 (six) hours as needed for mild pain or fever.   amiodarone 200 MG tablet Commonly known as:  PACERONE Take 200 mg by mouth daily.   amLODipine 10 MG tablet Commonly known as:  NORVASC Take 10 mg by mouth daily.   aspirin 81 MG chewable tablet Chew 81 mg by mouth daily.   atorvastatin 80 MG tablet Commonly  known as:  LIPITOR Take 80 mg by mouth daily.   brimonidine 0.15 % ophthalmic solution Commonly known as:  ALPHAGAN Place 1 drop into both eyes 3 (three) times daily.   cholecalciferol 1000 units tablet Commonly known as:  VITAMIN D Take 1,000 Units by mouth daily.   Eliquis 5 MG Tabs tablet Generic drug:  apixaban Take 5 mg by mouth 2 (two) times daily.   furosemide 40 MG tablet Commonly known as:  Lasix Take 1 tablet (40 mg  total) by mouth 2 (two) times daily.   latanoprost 0.005 % ophthalmic solution Commonly known as:  XALATAN Place 1 drop into both eyes at bedtime.   magnesium oxide 400 MG tablet Commonly known as:  MAG-OX Take 400 mg by mouth 2 (two) times daily.   metoprolol 200 MG 24 hr tablet Commonly known as:  TOPROL-XL Take 200 mg by mouth daily.   nitroGLYCERIN 0.4 MG SL tablet Commonly known as:  NITROSTAT Place 0.4 mg under the tongue every 5 (five) minutes as needed for chest pain.   potassium chloride 10 MEQ tablet Commonly known as:  K-DUR Take 1 tablet (10 mEq total) by mouth daily.   tamsulosin 0.4 MG Caps capsule Commonly known as:  FLOMAX Take 1 capsule (0.4 mg total) by mouth daily.   tiotropium 18 MCG inhalation capsule Commonly known as:  Spiriva HandiHaler Place 1 capsule (18 mcg total) into inhaler and inhale daily for 30 days.         DISCHARGE INSTRUCTIONS:   DIET:  Cardiac diet  DISCHARGE CONDITION:  Stable  ACTIVITY:  Activity as tolerated  OXYGEN:  Home Oxygen: Yes.     Oxygen Delivery: 3 liters/min via Patient connected to nasal cannula oxygen  DISCHARGE LOCATION:  Home with Home Health PT, RN, Aide, Social Work.    If you experience worsening of your admission symptoms, develop shortness of breath, life threatening emergency, suicidal or homicidal thoughts you must seek medical attention immediately by calling 911 or calling your MD immediately  if symptoms less severe.  You Must read complete instructions/literature along with all the possible adverse reactions/side effects for all the Medicines you take and that have been prescribed to you. Take any new Medicines after you have completely understood and accpet all the possible adverse reactions/side effects.   Please note  You were cared for by a hospitalist during your hospital stay. If you have any questions about your discharge medications or the care you received while you were in the  hospital after you are discharged, you can call the unit and asked to speak with the hospitalist on call if the hospitalist that took care of you is not available. Once you are discharged, your primary care physician will handle any further medical issues. Please note that NO REFILLS for any discharge medications will be authorized once you are discharged, as it is imperative that you return to your primary care physician (or establish a relationship with a primary care physician if you do not have one) for your aftercare needs so that they can reassess your need for medications and monitor your lab values.     Today   Shortness of breath improved since admission.  Patient denies any chest pain or any other complaints presently.  Ambulated with the help of physical therapy and the recommend home health services.  Likely discharge home today.  VITAL SIGNS:  Blood pressure 116/68, pulse 66, temperature 97.8 F (36.6 C), temperature source Oral, resp. rate 20,  height 5\' 11"  (1.803 m), weight 96.2 kg, SpO2 95 %.  I/O:    Intake/Output Summary (Last 24 hours) at 01/30/2019 1421 Last data filed at 01/30/2019 1225 Gross per 24 hour  Intake 10 ml  Output 2605 ml  Net -2595 ml    PHYSICAL EXAMINATION:   GENERAL:  72 y.o.-year-old obese patient lying in bed in mild Resp. Distress.   EYES: Pupils equal, round, reactive to light and accommodation. No scleral icterus. Extraocular muscles intact.  HEENT: Head atraumatic, normocephalic. Oropharynx and nasopharynx clear.  NECK:  Supple, no jugular venous distention. No thyroid enlargement, no tenderness.  LUNGS: Normal breath sounds bilaterally, no wheezing, rales, rhonchi. (-) use of accessory muscles of respiration.  CARDIOVASCULAR: S1, S2 normal. No murmurs, rubs, or gallops.  ABDOMEN: Soft, nontender, nondistended. Bowel sounds present. No organomegaly or mass.  EXTREMITIES: No cyanosis, clubbing, + 1 edema b/l.     NEUROLOGIC: Cranial nerves II  through XII are intact. No focal Motor or sensory deficits b/l. Globally weak.   PSYCHIATRIC: The patient is alert and oriented x 3.  SKIN: No obvious rash, lesion, or ulcer.   DATA REVIEW:   CBC Recent Labs  Lab 01/29/19 0502  WBC 9.8  HGB 8.6*  HCT 28.1*  PLT 214    Chemistries  Recent Labs  Lab 01/26/19 0406  01/30/19 0553  NA 143   < > 142  K 3.7   < > 3.4*  CL 106   < > 103  CO2 29   < > 30  GLUCOSE 79   < > 88  BUN 40*   < > 35*  CREATININE 1.66*   < > 1.60*  CALCIUM 8.3*   < > 8.7*  MG 2.3  --   --    < > = values in this interval not displayed.    Cardiac Enzymes Recent Labs  Lab 01/25/19 2339  TROPONINI 0.10*    Microbiology Results  Results for orders placed or performed during the hospital encounter of 01/25/19  SARS Coronavirus 2 (CEPHEID- Performed in [place] hospital lab), Hosp Order     Status: None   Collection Time: 01/25/19  6:06 PM  Result Value Ref Range Status   SARS Coronavirus 2 NEGATIVE NEGATIVE Final    Comment: (NOTE) If result is NEGATIVE SARS-CoV-2 target nucleic acids are NOT DETECTED. The SARS-CoV-2 RNA is generally detectable in upper and lower  respiratory specimens during the acute phase of infection. The lowest  concentration of SARS-CoV-2 viral copies this assay can detect is 250  copies / mL. A negative result does not preclude SARS-CoV-2 infection  and should not be used as the sole basis for treatment or other  patient management decisions.  A negative result may occur with  improper specimen collection / handling, submission of specimen other  than nasopharyngeal swab, presence of viral mutation(s) within the  areas targeted by this assay, and inadequate number of viral copies  (<250 copies / mL). A negative result must be combined with clinical  observations, patient history, and epidemiological information. If result is POSITIVE SARS-CoV-2 target nucleic acids are DETECTED. The SARS-CoV-2 RNA is generally  detectable in upper and lower  respiratory specimens dur ing the acute phase of infection.  Positive  results are indicative of active infection with SARS-CoV-2.  Clinical  correlation with patient history and other diagnostic information is  necessary to determine patient infection status.  Positive results do  not rule out bacterial infection or  co-infection with other viruses. If result is PRESUMPTIVE POSTIVE SARS-CoV-2 nucleic acids MAY BE PRESENT.   A presumptive positive result was obtained on the submitted specimen  and confirmed on repeat testing.  While 2019 novel coronavirus  (SARS-CoV-2) nucleic acids may be present in the submitted sample  additional confirmatory testing may be necessary for epidemiological  and / or clinical management purposes  to differentiate between  SARS-CoV-2 and other Sarbecovirus currently known to infect humans.  If clinically indicated additional testing with an alternate test  methodology [phone]) is advised. The SARS-CoV-2 RNA is generally  detectable in upper and lower respiratory sp ecimens during the acute  phase of infection. The expected result is Negative. Fact Sheet for Patients:  StrictlyIdeas.no Fact Sheet for Healthcare Providers: BankingDealers.co.za This test is not yet approved or cleared by the [place] FDA and has been authorized for detection and/or diagnosis of SARS-CoV-2 by FDA under an Emergency Use Authorization (EUA).  This EUA will remain in effect (meaning this test can be used) for the duration of the COVID-19 declaration under Section 564(b)(1) of the Act, 21 U.S.C. section 360bbb-3(b)(1), unless the authorization is terminated or revoked sooner. Performed at [facility], [address], [place], [place] [address]     RADIOLOGY:  No results found.    Management plans discussed with the patient, family and they are in agreement.  CODE STATUS:      Code Status Orders  (From admission, onward)         Start     Ordered   01/25/19 1854  Full code  Continuous     01/25/19 1855          TOTAL TIME TAKING CARE OF THIS PATIENT: 40 minutes.    Henreitta Leber M.D on 01/30/2019 at 2:21 PM  Between 7am to 6pm - Pager - [phone]  After 6pm go to [email or web address] - Technical brewer [place] [facility]  Office  [phone]  CC: Primary care physician; [employer], [employer]

## 2019-01-30 NOTE — TOC Transition Note (Signed)
Transition of Care [facility]) - CM/SW Discharge Note   Patient Details  Name: AQUAN KOPE MRN: [id number] Date of Birth: Jan 05, 1946  Transition of Care [facility]) CM/SW Contact:  Elza Rafter, RN Phone Number: 01/30/2019, 12:01 PM   Clinical Narrative:   Damaris Schooner with [place] [employer] [phone].  She states she did place key under mat.  Will notify [place], RN.      Final next level of care: [place] Barriers to Discharge: No Barriers Identified   Patient Goals and CMS Choice Patient states their goals for this hospitalization and ongoing recovery are:: Go home and would like a portable oxygen packet that is not as big and bulky as the one he has. CMS Medicare.gov Compare Post Acute Care list provided to:: Patient Choice offered to / list presented to : Patient  Discharge Placement                       Discharge Plan and Services   Discharge Planning Services: CM Consult Post Acute Care Choice: Home Health                    HH Arranged: RN, PT, Social Work [place] Agency: [facility]) Date [place]: 01/26/19 Time [place]: 1026 Representative spoke with at [place]: [place] ([place]) Interventions     Readmission Risk Interventions Readmission Risk Prevention Plan 01/26/2019 01/21/2019 01/05/2019  Transportation Screening Complete Complete Complete  Medication Review Press photographer) Complete Complete Complete  PCP or Specialist appointment within 3-5 days of discharge Complete Complete Complete  HRI or Home Care Consult Complete Complete Complete  SW Recovery Care/Counseling Consult Not Complete - Patient refused  SW Consult Not Complete Comments N/A - -  Palliative Care Screening Not Applicable Not Applicable Not [place] Not Applicable Not Applicable Not Applicable  Some recent data might be hidden

## 2019-01-30 NOTE — Progress Notes (Signed)
IV and tele removed from patient. Discharge instructions given to patient. Verbalized understanding. No acute distress at this time. EMS called for transportation. Patient awaiting transport.

## 2019-01-30 NOTE — Plan of Care (Signed)
  Problem: Activity: Goal: Risk for activity intolerance will decrease Outcome: Progressing   Problem: Nutrition: Goal: Adequate nutrition will be maintained Outcome: Progressing   Problem: Safety: Goal: Ability to remain free from injury will improve Outcome: Progressing   Problem: Education: Goal: Knowledge of General Education information will improve Description Including pain rating scale, medication(s)/side effects and non-pharmacologic comfort measures Outcome: Completed/Met

## 2019-01-30 NOTE — Progress Notes (Signed)
Cardiovascular and Pulmonary Nurse Navigator Note:    Rounded on patient to discuss Mom's Meals with patient.  This RN had previously spoken to Intake Coordinator for [place] [employer] [facility] at [phone], to check patient's eligibility for this service.  Patient has [employer].  The Intake Coordinator indicated he is a candidate for this service and there will be no charges for these meals.  Patient agreed to having these meals delivered.  Patient will receive 14 (lower sodium < 600 mg) meals per week for 3 months.  Patient stated he has no food allergies nor any specific food that he cannot tolerate or dislikes. Completed and signed by patient Meal Referral Form faxed to [phone].  (Note:  Fax Transmission Log indicated fax went through.)  Spoke to patient's daughter, Shunsuke Granzow, on the phone and informed her of this program.  Patient will receive a phone call before the meals are delivered.    Patient expressed appreciation for this service.    Roanna Epley, RN, BSN, [facility]  Cardiovascular & Pulmonary Nurse Navigator  Direct Line: [phone]  Department Phone #: [phone] Fax: [phone]  Email Address: [email or web address]

## 2019-01-30 NOTE — Care Management Important Message (Signed)
Important Message  Patient Details  Name: KANAAN KAGAWA MRN: [id number] Date of Birth: [birth year]   Medicare Important Message Given:  Yes    Dannette Barbara 01/30/2019, 11:50 AM

## 2019-02-04 ENCOUNTER — Other Ambulatory Visit (HOSPITAL_COMMUNITY): Payer: Self-pay

## 2019-02-04 ENCOUNTER — Encounter (HOSPITAL_COMMUNITY): Payer: Self-pay

## 2019-02-04 ENCOUNTER — Telehealth: Payer: Self-pay

## 2019-02-04 NOTE — Progress Notes (Signed)
First visit with Darius Taylor to introduce myself and the program.  He is willing to be part of the program.  He gets short of breath easily with ambulating.  He states getting better day by day since discharge.  He states feeling better.  He has no swelling in extremities.  He states he does not feel over loaded.  He is not weighing, stressed to him importance of weighing everyday.  Last weight is 212.2 lbs.  He states watches his sodium but foods sitting around in his house is high in sodium, he is signed up to get moms meals today.  Discussed fluid intake and watching how much he drinks.  He is aware of how to take his medications.  He does not want a med box, he has his medications arranged in order for him to know how to take.  He is taking spironolactone 12.5 mg everyday addition to his med list, sent message to [name] if she wants him to continue to take it.  He is aware of appt with [facility] tomorrow.  He denies chest pain, headaches or dizziness.  He is out of nitro pills but has not needed any.  He does not have emergency back up for his oxygen.  Will contact his oxygen supplier and possibly switch him to [place] so he can get a portable concentrator since he is on oxygen 24 hours a day now.  Will visit with him for heart failure, diet and medication compliance.    Kelly [phone]

## 2019-02-04 NOTE — Telephone Encounter (Signed)
   TELEPHONE CALL NOTE  This patient has been deemed a candidate for follow-up tele-health visit to limit community exposure during the Covid-19 pandemic. I spoke with the patient via phone to discuss instructions. The patient was advised to review the section on consent for treatment as well. The patient will receive a phone call 2-3 days prior to their E-Visit at which time consent will be verbally confirmed. A Virtual Office Visit appointment type has been scheduled for 02/05/2019 with [facility].  [name], [place] 02/04/2019 4:07 PM

## 2019-02-04 NOTE — Telephone Encounter (Signed)
TELEPHONE CALL NOTE  Darius Taylor has been deemed a candidate for a follow-up tele-health visit to limit community exposure during the Covid-19 pandemic. I spoke with the patient via phone to ensure availability of phone/video source, confirm preferred email & phone number, discuss instructions and expectations, and review consent.   I reminded Darius Taylor to be prepared with any vital sign and/or heart rhythm information that could potentially be obtained via home monitoring, at the time of his visit.  Finally, I reminded Darius Taylor to expect an e-mail containing a link for their video-based visit approximately 15 minutes before his visit, or alternatively, a phone call at the time of his visit if his visit is planned to be a phone encounter.  Did the patient verbally consent to treatment as below? YES  Darius Taylor, CMA 02/04/2019 4:07 PM  CONSENT FOR TELE-HEALTH VISIT - PLEASE REVIEW  I hereby voluntarily request, consent and authorize [facility] and its employed or contracted physicians, physician assistants, nurse practitioners or other licensed health care professionals (the Practitioner), to provide me with telemedicine health care services (the "Services") as deemed necessary by the treating Practitioner. I acknowledge and consent to receive the Services by the Practitioner via telemedicine. I understand that the telemedicine visit will involve communicating with the Practitioner through telephonic communication technology and the disclosure of certain medical information by electronic transmission. I acknowledge that I have been given the opportunity to request an in-person assessment or other available alternative prior to the telemedicine visit and am voluntarily participating in the telemedicine visit.  I understand that I have the right to withhold or withdraw my consent to the use of telemedicine in the course of my care at any time, without affecting my  right to future care or treatment, and that the Practitioner or I may terminate the telemedicine visit at any time. I understand that I have the right to inspect all information obtained and/or recorded in the course of the telemedicine visit and may receive copies of available information for a reasonable fee.  I understand that some of the potential risks of receiving the Services via telemedicine include:  Marland Kitchen Delay or interruption in medical evaluation due to technological equipment failure or disruption; . Information transmitted may not be sufficient (e.g. poor resolution of images) to allow for appropriate medical decision making by the Practitioner; and/or  . In rare instances, security protocols could fail, causing a breach of personal health information.  Furthermore, I acknowledge that it is my responsibility to provide information about my medical history, conditions and care that is complete and accurate to the best of my ability. I acknowledge that Practitioner's advice, recommendations, and/or decision may be based on factors not within their control, such as incomplete or inaccurate data provided by me or lack of visual representation. I understand that the practice of medicine is not an exact science and that Practitioner makes no warranties or guarantees regarding treatment outcomes. I acknowledge that I will receive a copy of this consent concurrently upon execution via email to the email address I last provided but may also request a printed copy by calling the office of [facility].    I understand that my insurance may be billed for this visit.   I have read or had this consent read to me. . I understand the contents of this consent, which adequately explains the benefits and risks of the Services being provided via telemedicine.  Marland Kitchen  I have been provided ample opportunity to ask questions regarding this consent and the Services and have had my questions answered to my  satisfaction. . I give my informed consent for the services to be provided through the use of telemedicine in my medical care  By participating in this telemedicine visit I agree to the above.

## 2019-02-05 ENCOUNTER — Ambulatory Visit: Payer: Medicare HMO | Attending: Family | Admitting: Family

## 2019-02-05 ENCOUNTER — Encounter: Payer: Self-pay | Admitting: Family

## 2019-02-05 ENCOUNTER — Other Ambulatory Visit: Payer: Self-pay

## 2019-02-05 VITALS — Wt 212.0 lb

## 2019-02-05 DIAGNOSIS — I10 Essential (primary) hypertension: Secondary | ICD-10-CM

## 2019-02-05 DIAGNOSIS — I5032 Chronic diastolic (congestive) heart failure: Secondary | ICD-10-CM

## 2019-02-05 NOTE — Progress Notes (Signed)
Virtual Visit via Telephone Note    Evaluation Performed:  Follow-up visit  This visit type was conducted due to national recommendations for restrictions regarding the COVID-19 Pandemic (e.g. social distancing).  This format is felt to be most appropriate for this patient at this time.  All issues noted in this document were discussed and addressed.  No physical exam was performed (except for noted visual exam findings with Video Visits).  Please refer to the patient's chart (MyChart message for video visits and phone note for telephone visits) for the patient's consent to telehealth for [facility]  Date:  02/05/2019   ID:  Darius Taylor, DOB 1946-04-25, MRN [id number]  Patient Location:  [place] [address]   Provider location:   [facility] [address], [place] [address]  PCP:  [facility] [employer]  Cardiologist:  No primary care provider on file.  Electrophysiologist:  None   Chief Complaint:  Shortness of breath  History of Present Illness:    Darius Taylor is a 73 y.o. male who presents via audio/video conferencing for a telehealth visit today.  Patient verified DOB and address.  The patient does not have symptoms concerning for COVID-19 infection (fever, chills, cough, or new SHORTNESS OF BREATH).   Patient reports minimal shortness of breath upon moderate exertion. He describes this as chronic in nature having been present for several years. He has associated cough and intermittent fatigue along with this. He denies any pedal edema, palpitations, chest pain, difficulty sleeping or weight gain. Wearing oxygen at 3L around the clock.   Prior CV studies:   The following studies were reviewed today:  Echo report from 11/04/2018 reviewed and showed an EF of 50-55% along with moderate TR and mild AR.   Past Medical History:  Diagnosis Date  . Aortic stenosis   . Arrhythmia    atrial  fibrillation  . Asthma   . Atrial fibrillation ([place])   . CAD (coronary artery disease)   . Cancer [facility])    colon, bladder and kidney  . CHF (congestive heart failure) ([place])   . 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 COPD (chronic obstructive pulmonary disease) ([place])   . H/O ventricular tachycardia   . Hypertension   . Nonischemic cardiomyopathy ([place])    Status post AICD and pacemaker  . Renal cell carcinoma [facility])    Past Surgical History:  Procedure Laterality Date  . CARDIAC DEFIBRILLATOR PLACEMENT    . CARDIAC DEFIBRILLATOR PLACEMENT    . COLONOSCOPY WITH PROPOFOL N/A 06/10/2018   Procedure: COLONOSCOPY WITH PROPOFOL;  Surgeon: Lin Landsman, MD;  Location: [facility] ENDOSCOPY;  Service: Gastroenterology;  Laterality: N/A;  . CORONARY ANGIOPLASTY  09/25/2016  . IRRIGATION AND DEBRIDEMENT HEMATOMA Left 10/24/2016   Procedure: IRRIGATION AND DEBRIDEMENT HEMATOMA;  Surgeon: Florene Glen, MD;  Location: [facility] ORS;  Service: General;  Laterality: Left;  . KIDNEY SURGERY     left ne[hrectomy for RCC  . PACEMAKER IMPLANT    . PARTIAL COLECTOMY       Prior to Admission medications   Medication Sig Start Date End Date Taking? Authorizing Provider  amiodarone (PACERONE) 200 MG tablet Take 200 mg by mouth daily.   Yes [provider]  amLODipine (NORVASC) 10 MG tablet Take 10 mg by mouth daily.   Yes [provider]  apixaban (ELIQUIS) 5 MG TABS tablet Take 5 mg by mouth 2 (two) times daily.  Yes [provider]  aspirin 81 MG chewable tablet Chew 81 mg by mouth daily.   Yes [provider]  atorvastatin (LIPITOR) 80 MG tablet Take 80 mg by mouth daily. 12/02/18  Yes [provider]  brimonidine (ALPHAGAN) 0.15 % ophthalmic solution Place 1 drop into both eyes 3 (three) times daily. 12/20/17  Yes [provider]  cholecalciferol (VITAMIN D) 1000 units tablet Take 1,000 Units by mouth daily.   Yes [provider]  furosemide (LASIX)  40 MG tablet Take 1 tablet (40 mg total) by mouth 2 (two) times daily. 01/21/19  Yes Mody, Sital, MD  latanoprost (XALATAN) 0.005 % ophthalmic solution Place 1 drop into both eyes at bedtime. 12/20/17  Yes [provider]  magnesium oxide (MAG-OX) 400 MG tablet Take 400 mg by mouth 2 (two) times daily.   Yes [provider]  metoprolol (TOPROL-XL) 200 MG 24 hr tablet Take 200 mg by mouth daily.   Yes [provider]  nitroGLYCERIN (NITROSTAT) 0.4 MG SL tablet Place 0.4 mg under the tongue every 5 (five) minutes as needed for chest pain.   Yes [provider]  potassium chloride (K-DUR) 10 MEQ tablet Take 1 tablet (10 mEq total) by mouth daily. 01/21/19  Yes Mody, Ulice Bold, MD  tiotropium (SPIRIVA) 18 MCG inhalation capsule Place 18 mcg into inhaler and inhale daily.   Yes [provider]  acetaminophen (TYLENOL) 325 MG tablet Take 650 mg by mouth every 6 (six) hours as needed for mild pain or fever.     [provider]    Allergies:   Patient has no known allergies.   Social History   Tobacco Use  . Smoking status: Former Research scientist (life sciences)  . Smokeless tobacco: Never Used  Substance Use Topics  . Alcohol use: No  . Drug use: No     Family Hx: The patient's family history includes Hypertension in his mother; Stroke in his father.  ROS:   Please see the history of present illness.     All other systems reviewed and are negative.   Labs/Other Tests and Data Reviewed:    Recent Labs: 11/03/2018: TSH 0.874 01/05/2019: ALT 83 01/25/2019: B Natriuretic Peptide 419.0 01/26/2019: Magnesium 2.3 01/29/2019: Hemoglobin 8.6; Platelets 214 01/30/2019: BUN 35; Creatinine, Ser 1.60; Potassium 3.4; Sodium 142   Recent Lipid Panel Lab Results  Component Value Date/Time   CHOL 121 07/25/2012 02:05 AM   TRIG 114 07/25/2012 02:05 AM   HDL 48 07/25/2012 02:05 AM   LDLCALC 50 07/25/2012 02:05 AM    Wt Readings from Last 3 Encounters:  02/05/19 212 lb (96.2 kg)   02/04/19 212 lb 3.2 oz (96.3 kg)  01/30/19 212 lb (96.2 kg)     Exam:    Vital Signs:  Wt 212 lb (96.2 kg) Comment: self-reported  BMI 29.57 kg/m    Well nourished, well developed male in no  acute distress.   ASSESSMENT & PLAN:    1: Chronic heart failure with preserved ejection fraction- - NYHA class II - euvolemic today based on patient's description of symptoms - weighing daily and says that his weight has been stable; reminded to call for an overnight weight gain of >2 pounds or a weekly weight gain of >5 pounds - not adding salt to his food; reviewed the importance of closely following a 2000mg  sodium diet  - follows with cardiology Darius Taylor) - BNP 08/30/18 was 310.0 - participating in paramedic program - spoke with NP in the home  during telemedicine visit today who reviewed medications with me. Patient had spironolactone with his medications but he said that he wasn't taking it (had been stopped during a previous admission). NP says that she will put the medication up so he doesn't accidentally resume taking it - wearing oxygen at 3L   2: HTN-  - does not have a BP reading today - follows with PCP at [facility] 01/30/2019 reviewed and showed sodium 142, potassium 3.2, creatinine 1.6 and GFR 42    COVID-19 Education: The signs and symptoms of COVID-19 were discussed with the patient and how to seek care for testing (follow up with PCP or arrange E-visit).  The importance of social distancing was discussed today.  Patient Risk:   After full review of this patients clinical status, I feel that they are at least moderate risk at this time.  Time:   Today, I have spent 9 minutes with the patient with telehealth technology discussing weight, diet and symptoms to report.     Medication Adjustments/Labs and Tests Ordered: Current medicines are reviewed at length with the patient today.  Concerns regarding medicines are outlined above.   Tests Ordered: No orders of  the defined types were placed in this encounter.  Medication Changes: No orders of the defined types were placed in this encounter.   Disposition:  Follow-up in 6 weeks or sooner for any questions/problems before then.   Signed, Alisa Graff, FNP  02/05/2019 11:13 AM    [facility]

## 2019-02-05 NOTE — Patient Instructions (Signed)
Continue weighing daily and call for an overnight weight gain of > 2 pounds or a weekly weight gain of >5 pounds. 

## 2019-02-11 ENCOUNTER — Telehealth (HOSPITAL_COMMUNITY): Payer: Self-pay

## 2019-02-11 NOTE — Telephone Encounter (Signed)
Contacted Darius Taylor's daughter and set up appt with pulmology at [facility] clinic with Dr Lanney Gins for February 25, 2019 at 11:00 am.  She is aware.  Advised his office he is needing to switch services to [employer] to provide him with a portable concentrator and back up oxygen.  He does not have back up oxygen right now if he lost power and has to use bulky tanks to go out.  Will advise Darius Taylor also.   [place] [phone]

## 2019-02-23 ENCOUNTER — Encounter: Payer: Self-pay | Admitting: Emergency Medicine

## 2019-02-23 ENCOUNTER — Emergency Department: Payer: Medicare HMO

## 2019-02-23 ENCOUNTER — Inpatient Hospital Stay
Admission: EM | Admit: 2019-02-23 | Discharge: 2019-03-05 | DRG: 291 | Disposition: A | Discharge status: Home / Self Care | Payer: Medicare HMO | Attending: Internal Medicine | Admitting: Internal Medicine

## 2019-02-23 ENCOUNTER — Other Ambulatory Visit: Payer: Self-pay

## 2019-02-23 DIAGNOSIS — Z7901 Long term (current) use of anticoagulants: Secondary | ICD-10-CM

## 2019-02-23 DIAGNOSIS — I350 Nonrheumatic aortic (valve) stenosis: Secondary | ICD-10-CM | POA: Diagnosis present

## 2019-02-23 DIAGNOSIS — N179 Acute kidney failure, unspecified: Secondary | ICD-10-CM | POA: Diagnosis not present

## 2019-02-23 DIAGNOSIS — Z8249 Family history of ischemic heart disease and other diseases of the circulatory system: Secondary | ICD-10-CM

## 2019-02-23 DIAGNOSIS — Z7982 Long term (current) use of aspirin: Secondary | ICD-10-CM

## 2019-02-23 DIAGNOSIS — I420 Dilated cardiomyopathy: Secondary | ICD-10-CM | POA: Diagnosis present

## 2019-02-23 DIAGNOSIS — N183 Chronic kidney disease, stage 3 (moderate): Secondary | ICD-10-CM | POA: Diagnosis present

## 2019-02-23 DIAGNOSIS — Z20828 Contact with and (suspected) exposure to other viral communicable diseases: Secondary | ICD-10-CM | POA: Diagnosis present

## 2019-02-23 DIAGNOSIS — I482 Chronic atrial fibrillation, unspecified: Secondary | ICD-10-CM | POA: Diagnosis present

## 2019-02-23 DIAGNOSIS — I509 Heart failure, unspecified: Secondary | ICD-10-CM

## 2019-02-23 DIAGNOSIS — T380X5A Adverse effect of glucocorticoids and synthetic analogues, initial encounter: Secondary | ICD-10-CM | POA: Diagnosis present

## 2019-02-23 DIAGNOSIS — J9621 Acute and chronic respiratory failure with hypoxia: Secondary | ICD-10-CM

## 2019-02-23 DIAGNOSIS — Z9049 Acquired absence of other specified parts of digestive tract: Secondary | ICD-10-CM | POA: Diagnosis not present

## 2019-02-23 DIAGNOSIS — Z8551 Personal history of malignant neoplasm of bladder: Secondary | ICD-10-CM | POA: Diagnosis not present

## 2019-02-23 DIAGNOSIS — Z7189 Other specified counseling: Secondary | ICD-10-CM

## 2019-02-23 DIAGNOSIS — Z95810 Presence of automatic (implantable) cardiac defibrillator: Secondary | ICD-10-CM | POA: Diagnosis not present

## 2019-02-23 DIAGNOSIS — Z955 Presence of coronary angioplasty implant and graft: Secondary | ICD-10-CM | POA: Diagnosis not present

## 2019-02-23 DIAGNOSIS — I5033 Acute on chronic diastolic (congestive) heart failure: Secondary | ICD-10-CM | POA: Diagnosis present

## 2019-02-23 DIAGNOSIS — Z85038 Personal history of other malignant neoplasm of large intestine: Secondary | ICD-10-CM

## 2019-02-23 DIAGNOSIS — I248 Other forms of acute ischemic heart disease: Secondary | ICD-10-CM | POA: Diagnosis present

## 2019-02-23 DIAGNOSIS — Z87891 Personal history of nicotine dependence: Secondary | ICD-10-CM

## 2019-02-23 DIAGNOSIS — Z515 Encounter for palliative care: Secondary | ICD-10-CM

## 2019-02-23 DIAGNOSIS — Z952 Presence of prosthetic heart valve: Secondary | ICD-10-CM

## 2019-02-23 DIAGNOSIS — I130 Hypertensive heart and chronic kidney disease with heart failure and stage 1 through stage 4 chronic kidney disease, or unspecified chronic kidney disease: Principal | ICD-10-CM | POA: Diagnosis present

## 2019-02-23 DIAGNOSIS — Z85528 Personal history of other malignant neoplasm of kidney: Secondary | ICD-10-CM

## 2019-02-23 DIAGNOSIS — E669 Obesity, unspecified: Secondary | ICD-10-CM | POA: Diagnosis present

## 2019-02-23 DIAGNOSIS — Z79899 Other long term (current) drug therapy: Secondary | ICD-10-CM

## 2019-02-23 DIAGNOSIS — I5043 Acute on chronic combined systolic (congestive) and diastolic (congestive) heart failure: Secondary | ICD-10-CM | POA: Diagnosis present

## 2019-02-23 DIAGNOSIS — I2510 Atherosclerotic heart disease of native coronary artery without angina pectoris: Secondary | ICD-10-CM | POA: Diagnosis present

## 2019-02-23 DIAGNOSIS — J441 Chronic obstructive pulmonary disease with (acute) exacerbation: Secondary | ICD-10-CM

## 2019-02-23 DIAGNOSIS — L03116 Cellulitis of left lower limb: Secondary | ICD-10-CM | POA: Diagnosis present

## 2019-02-23 DIAGNOSIS — R7989 Other specified abnormal findings of blood chemistry: Secondary | ICD-10-CM

## 2019-02-23 DIAGNOSIS — R778 Other specified abnormalities of plasma proteins: Secondary | ICD-10-CM

## 2019-02-23 DIAGNOSIS — Z7951 Long term (current) use of inhaled steroids: Secondary | ICD-10-CM

## 2019-02-23 DIAGNOSIS — D631 Anemia in chronic kidney disease: Secondary | ICD-10-CM | POA: Diagnosis present

## 2019-02-23 DIAGNOSIS — J810 Acute pulmonary edema: Secondary | ICD-10-CM

## 2019-02-23 DIAGNOSIS — Z6829 Body mass index (BMI) 29.0-29.9, adult: Secondary | ICD-10-CM

## 2019-02-23 DIAGNOSIS — R0603 Acute respiratory distress: Secondary | ICD-10-CM

## 2019-02-23 LAB — COMPREHENSIVE METABOLIC PANEL
ALT: 22 U/L (ref 0–44)
AST: 24 U/L (ref 15–41)
Albumin: 3.3 g/dL — ABNORMAL LOW (ref 3.5–5.0)
Alkaline Phosphatase: 82 U/L (ref 38–126)
Anion gap: 9 (ref 5–15)
BUN: 44 mg/dL — ABNORMAL HIGH (ref 8–23)
CO2: 28 mmol/L (ref 22–32)
Calcium: 8.6 mg/dL — ABNORMAL LOW (ref 8.9–10.3)
Chloride: 101 mmol/L (ref 98–111)
Creatinine, Ser: 1.79 mg/dL — ABNORMAL HIGH (ref 0.61–1.24)
GFR calc Af Amer: 42 mL/min — ABNORMAL LOW (ref >60)
GFR calc non Af Amer: 37 mL/min — ABNORMAL LOW (ref >60)
Glucose, Bld: 161 mg/dL — ABNORMAL HIGH (ref 70–99)
Potassium: 4 mmol/L (ref 3.5–5.1)
Sodium: 138 mmol/L (ref 135–145)
Total Bilirubin: 1.1 mg/dL (ref 0.3–1.2)
Total Protein: 7.9 g/dL (ref 6.5–8.1)

## 2019-02-23 LAB — CBC
HCT: 28 % — ABNORMAL LOW (ref 39.0–52.0)
Hemoglobin: 8.4 g/dL — ABNORMAL LOW (ref 13.0–17.0)
MCH: 29.2 pg (ref 26.0–34.0)
MCHC: 30 g/dL (ref 30.0–36.0)
MCV: 97.2 fL (ref 80.0–100.0)
Platelets: 293 10*3/uL (ref 150–400)
RBC: 2.88 MIL/uL — ABNORMAL LOW (ref 4.22–5.81)
RDW: 15.9 % — ABNORMAL HIGH (ref 11.5–15.5)
WBC: 10.9 10*3/uL — ABNORMAL HIGH (ref 4.0–10.5)
nRBC: 0 % (ref 0.0–0.2)

## 2019-02-23 LAB — TROPONIN I
Troponin I: 0.08 ng/mL (ref <0.03)
Troponin I: 0.08 ng/mL (ref <0.03)
Troponin I: 0.08 ng/mL (ref <0.03)

## 2019-02-23 LAB — SARS CORONAVIRUS 2 BY RT PCR (HOSPITAL ORDER, PERFORMED IN ~~LOC~~ HOSPITAL LAB): SARS Coronavirus 2: NEGATIVE

## 2019-02-23 LAB — BRAIN NATRIURETIC PEPTIDE: B Natriuretic Peptide: 686 pg/mL — ABNORMAL HIGH (ref 0.0–100.0)

## 2019-02-23 LAB — LACTIC ACID, PLASMA: Lactic Acid, Venous: 1.9 mmol/L (ref 0.5–1.9)

## 2019-02-23 MED ORDER — SODIUM CHLORIDE 0.9% FLUSH: 3.0000 mL | INTRAVENOUS | Status: DC | PRN

## 2019-02-23 MED ORDER — NITROGLYCERIN 0.4 MG SL SUBL: 0.4000 mg | SUBLINGUAL_TABLET | SUBLINGUAL | Status: DC | PRN

## 2019-02-23 MED ORDER — ATORVASTATIN CALCIUM 20 MG PO TABS
80.0000 mg | ORAL_TABLET | Freq: Every day | ORAL | Status: DC
  Administered 2019-02-23 – 2019-03-05 (×11): 80 mg via ORAL
  Filled 2019-02-23 (×11): qty 4

## 2019-02-23 MED ORDER — METOPROLOL SUCCINATE ER 100 MG PO TB24
200.0000 mg | ORAL_TABLET | Freq: Every day | ORAL | Status: DC
  Administered 2019-02-24 – 2019-03-05 (×10): 200 mg via ORAL
  Filled 2019-02-23 (×11): qty 2

## 2019-02-23 MED ORDER — LATANOPROST 0.005 % OP SOLN
1.0000 [drp] | Freq: Every day | OPHTHALMIC | Status: DC
  Administered 2019-02-23 – 2019-03-04 (×10): 1 [drp] via OPHTHALMIC
  Filled 2019-02-23: qty 2.5

## 2019-02-23 MED ORDER — APIXABAN 5 MG PO TABS
5.0000 mg | ORAL_TABLET | Freq: Two times a day (BID) | ORAL | Status: DC
  Administered 2019-02-23 – 2019-03-05 (×20): 5 mg via ORAL
  Filled 2019-02-23 (×20): qty 1

## 2019-02-23 MED ORDER — VITAMIN D3 25 MCG (1000 UNIT) PO TABS
1000.0000 [IU] | ORAL_TABLET | Freq: Every day | ORAL | Status: DC
  Administered 2019-02-24 – 2019-03-05 (×10): 1000 [IU] via ORAL
  Filled 2019-02-23 (×10): qty 1

## 2019-02-23 MED ORDER — SODIUM CHLORIDE 0.9 % IV SOLN
250.0000 mL | INTRAVENOUS | Status: DC | PRN
  Administered 2019-02-27 (×2): 250 mL via INTRAVENOUS
  Administered 2019-02-28: 10 mL via INTRAVENOUS
  Administered 2019-02-28 (×2): 250 mL via INTRAVENOUS
  Administered 2019-02-28: 10 mL via INTRAVENOUS
  Administered 2019-03-01: 250 mL via INTRAVENOUS
  Administered 2019-03-01: 10 mL via INTRAVENOUS
  Administered 2019-03-01: 250 mL via INTRAVENOUS

## 2019-02-23 MED ORDER — FUROSEMIDE 10 MG/ML IJ SOLN
80.0000 mg | Freq: Once | INTRAMUSCULAR | Status: AC
  Administered 2019-02-23: 80 mg via INTRAVENOUS
  Filled 2019-02-23: qty 8

## 2019-02-23 MED ORDER — ASPIRIN 81 MG PO CHEW
81.0000 mg | CHEWABLE_TABLET | Freq: Every day | ORAL | Status: DC
  Administered 2019-02-24 – 2019-03-05 (×10): 81 mg via ORAL
  Filled 2019-02-23 (×10): qty 1

## 2019-02-23 MED ORDER — ACETAMINOPHEN 325 MG PO TABS
650.0000 mg | ORAL_TABLET | ORAL | Status: DC | PRN
  Administered 2019-02-24 – 2019-03-05 (×6): 650 mg via ORAL
  Filled 2019-02-23 (×7): qty 2

## 2019-02-23 MED ORDER — ONDANSETRON HCL 4 MG/2ML IJ SOLN: 4.0000 mg | Freq: Four times a day (QID) | INTRAMUSCULAR | Status: DC | PRN

## 2019-02-23 MED ORDER — AMIODARONE HCL 200 MG PO TABS
200.0000 mg | ORAL_TABLET | Freq: Every day | ORAL | Status: DC
  Administered 2019-02-24 – 2019-03-05 (×10): 200 mg via ORAL
  Filled 2019-02-23 (×10): qty 1

## 2019-02-23 MED ORDER — TIOTROPIUM BROMIDE MONOHYDRATE 18 MCG IN CAPS
18.0000 ug | ORAL_CAPSULE | Freq: Every day | RESPIRATORY_TRACT | Status: DC
  Administered 2019-02-24 – 2019-03-05 (×10): 18 ug via RESPIRATORY_TRACT
  Filled 2019-02-23 (×2): qty 5

## 2019-02-23 MED ORDER — BRIMONIDINE TARTRATE 0.15 % OP SOLN
1.0000 [drp] | Freq: Three times a day (TID) | OPHTHALMIC | Status: DC
  Administered 2019-02-24 – 2019-03-05 (×27): 1 [drp] via OPHTHALMIC
  Filled 2019-02-23 (×3): qty 5

## 2019-02-23 MED ORDER — LOSARTAN POTASSIUM 25 MG PO TABS
25.0000 mg | ORAL_TABLET | Freq: Every day | ORAL | Status: DC
  Administered 2019-02-24 – 2019-02-26 (×3): 25 mg via ORAL
  Filled 2019-02-23 (×3): qty 1

## 2019-02-23 MED ORDER — FUROSEMIDE 10 MG/ML IJ SOLN
40.0000 mg | Freq: Two times a day (BID) | INTRAMUSCULAR | Status: DC
  Administered 2019-02-24 – 2019-02-26 (×6): 40 mg via INTRAVENOUS
  Filled 2019-02-23 (×6): qty 4

## 2019-02-23 MED ORDER — SODIUM CHLORIDE 0.9% FLUSH
3.0000 mL | Freq: Two times a day (BID) | INTRAVENOUS | Status: DC
  Administered 2019-02-23 – 2019-03-05 (×19): 3 mL via INTRAVENOUS

## 2019-02-23 MED ORDER — POTASSIUM CHLORIDE CRYS ER 10 MEQ PO TBCR
10.0000 meq | EXTENDED_RELEASE_TABLET | Freq: Every day | ORAL | Status: DC
  Administered 2019-02-23 – 2019-02-26 (×4): 10 meq via ORAL
  Filled 2019-02-23 (×5): qty 1

## 2019-02-23 MED ORDER — MAGNESIUM OXIDE 400 (241.3 MG) MG PO TABS
400.0000 mg | ORAL_TABLET | Freq: Two times a day (BID) | ORAL | Status: DC
  Administered 2019-02-23 – 2019-03-05 (×20): 400 mg via ORAL
  Filled 2019-02-23 (×20): qty 1

## 2019-02-23 MED ORDER — NITROGLYCERIN 2 % TD OINT
0.5000 [in_us] | TOPICAL_OINTMENT | Freq: Four times a day (QID) | TRANSDERMAL | Status: DC
  Administered 2019-02-23: 0.5 [in_us] via TOPICAL
  Filled 2019-02-23 (×2): qty 1

## 2019-02-23 NOTE — Progress Notes (Signed)
Care Alignment Note  Advanced Directives Documents (Living Will, Power of Attorney) currently in the EHR no advanced directives documents available .  Has the patient discussed their wishes with their family/healthcare power of attorney no.  What does the patient/decision maker understand about their medical condition and the natural course of their disease.  Acute hypoxic respiratory failure.  Acute on chronic diastolic CHF exacerbation.  Chronic kidney disease stage III.  Hypertension.  Chronic atrial fibrillation.  Elevated troponin.  What is the patient/decision maker's biggest fear or concern for the future becoming a burden to my family  What is the most important goal for this patient should their health condition worsen maintenance of function.  Current   Code Status: Full Code  Current code status has been reviewed/updated.  Time spent:Full code

## 2019-02-23 NOTE — H&P (Signed)
[facility] NAME: Darius Taylor    MR#:  [id number]  DATE OF BIRTH:  1946-07-28  DATE OF ADMISSION:  02/23/2019  PRIMARY CARE PHYSICIAN: [facility] [place]   REQUESTING/REFERRING PHYSICIAN: Lavonia Drafts  CHIEF COMPLAINT:   Chief Complaint  Patient presents with  . Shortness of Breath    HISTORY OF PRESENT ILLNESS:  Darius Taylor  is a 73 y.o. male with a known history of chronic atrial fibrillation on anticoagulation with Eliquis, chronic kidney disease stage III, COPD, prior history of AICD, chronic diastolic CHF and prior history of asthma who presented to the emergency room with complaints of worsening shortness of breath.  Oxygen saturation was reported to be in the 80s on arrival of EMS.  Patient was initially started on nonrebreather and subsequently transitioned to oxygen via nasal cannula.  At the time of my evaluation patient was down to 6 L of oxygen.  Patient denied any chest pain.  No fevers.  COVID testing done was negative.  Troponin initially elevated at 0.08.  Repeat troponin stable at 0.08.  B type nitrate peptide of 686.  Chest x-ray done with findings consistent with pulmonary vascular congestion with small pleural effusion and interstitial edema.  Patient was diagnosed with acute on chronic diastolic CHF exacerbation.  Medical service called to admit patient for further evaluation and management.  PAST MEDICAL HISTORY:   Past Medical History:  Diagnosis Date  . Aortic stenosis   . Arrhythmia    atrial fibrillation  . Asthma   . Atrial fibrillation ([place])   . CAD (coronary artery disease)   . Cancer [facility])    colon, bladder and kidney  . CHF (congestive heart failure) ([place])   . 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 COPD (chronic obstructive pulmonary disease) ([place])   . H/O ventricular tachycardia   . Hypertension   . Nonischemic cardiomyopathy ([place])    Status post AICD and pacemaker  . 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 CARDIAC DEFIBRILLATOR PLACEMENT    . CARDIAC DEFIBRILLATOR PLACEMENT    . COLONOSCOPY WITH PROPOFOL N/A 06/10/2018   Procedure: COLONOSCOPY WITH PROPOFOL;  Surgeon: Lin Landsman, MD;  Location: [facility] ENDOSCOPY;  Service: Gastroenterology;  Laterality: N/A;  . CORONARY ANGIOPLASTY  09/25/2016  . IRRIGATION AND DEBRIDEMENT HEMATOMA Left 10/24/2016   Procedure: IRRIGATION AND DEBRIDEMENT HEMATOMA;  Surgeon: Florene Glen, MD;  Location: [facility] ORS;  Service: General;  Laterality: Left;  . KIDNEY SURGERY     left ne[hrectomy for RCC  . PACEMAKER IMPLANT    . PARTIAL COLECTOMY      SOCIAL HISTORY:   Social History   Tobacco Use  . Smoking status: Former Research scientist (life sciences)  . Smokeless tobacco: Never Used  Substance Use Topics  . Alcohol use: No    FAMILY HISTORY:   Family History  Problem Relation Age of Onset  . Hypertension Mother   . Stroke Father     DRUG ALLERGIES:  No Known Allergies  REVIEW OF SYSTEMS:   Review of Systems  Constitutional: Negative for chills and fever.  HENT: Negative for hearing loss and tinnitus.   Eyes: Negative for blurred vision and double vision.  Respiratory: Positive for shortness of breath. Negative for wheezing.   Cardiovascular: Positive for leg swelling. Negative for chest pain.  Gastrointestinal: Negative for heartburn and nausea.  Genitourinary: Negative  for dysuria and urgency.  Musculoskeletal: Negative for myalgias.  Skin: Negative for itching and rash.  Neurological: Negative for dizziness and headaches.  Psychiatric/Behavioral: Negative for depression and hallucinations.    MEDICATIONS AT HOME:   Prior to Admission medications   Medication Sig Start Date End Date Taking? Authorizing Provider  acetaminophen (TYLENOL) 325 MG tablet Take 650 mg by mouth every 6 (six) hours as needed for mild pain or fever.    Yes [provider]  allopurinol (ZYLOPRIM) 100 MG tablet Take 200 mg by mouth daily.   Yes [provider]  amiodarone (PACERONE) 200 MG tablet Take 200 mg by mouth daily.   Yes [provider]  amLODipine (NORVASC) 10 MG tablet Take 10 mg by mouth daily.   Yes [provider]  apixaban (ELIQUIS) 5 MG TABS tablet Take 5 mg by mouth 2 (two) times daily.   Yes [provider]  aspirin 81 MG chewable tablet Chew 81 mg by mouth daily.   Yes [provider]  atorvastatin (LIPITOR) 80 MG tablet Take 80 mg by mouth daily. 12/02/18  Yes [provider]  brimonidine (ALPHAGAN) 0.15 % ophthalmic solution Place 1 drop into both eyes 3 (three) times daily. 12/20/17  Yes [provider]  budesonide-formoterol (SYMBICORT) 160-4.5 MCG/ACT inhaler Inhale 2 puffs into the lungs 2 (two) times daily.   Yes [provider]  cholecalciferol (VITAMIN D) 1000 units tablet Take 1,000 Units by mouth daily.   Yes [provider]  furosemide (LASIX) 40 MG tablet Take 1 tablet (40 mg total) by mouth 2 (two) times daily. Patient taking differently: Take 40 mg by mouth 2 (two) times daily. May take an extra if needed 01/21/19  Yes Mody, Sital, MD  latanoprost (XALATAN) 0.005 % ophthalmic solution Place 1 drop into both eyes at bedtime. 12/20/17  Yes [provider]  magnesium oxide (MAG-OX) 400 MG tablet Take 400 mg by mouth 2 (two) times daily.   Yes [provider]  metoprolol (TOPROL-XL) 200 MG 24 hr tablet Take 200 mg by mouth daily.   Yes [provider]  nitroGLYCERIN (NITROSTAT) 0.4 MG SL tablet Place 0.4 mg under the tongue every 5 (five) minutes as needed for chest pain.   Yes [provider]  spironolactone (ALDACTONE) 25 MG tablet Take 12.5 mg by mouth daily.   Yes [provider]  tiotropium (SPIRIVA) 18 MCG inhalation capsule Place 18 mcg into inhaler and inhale daily.   Yes [provider]       VITAL SIGNS:  Blood pressure 135/73, pulse 60, temperature 97.6 F (36.4 C), temperature source Oral, resp. rate (!) 21, height 5\' 11"  (1.803 m), weight 97.5 kg, SpO2 92 %.  PHYSICAL EXAMINATION:  Physical Exam  GENERAL:  73 y.o.-year-old patient lying in the bed with no acute distress.  EYES: Pupils equal, round, reactive to light and accommodation. No scleral icterus. Extraocular muscles intact.  HEENT: Head atraumatic, normocephalic. Oropharynx and nasopharynx clear.  NECK:  Supple, no jugular venous distention. No thyroid enlargement, no tenderness.  LUNGS: Normal breath sounds bilaterally, no wheezing, rales,rhonchi or crepitation. No use of accessory muscles of respiration.  CARDIOVASCULAR: S1, S2 normal. No murmurs, rubs, or gallops.  ABDOMEN: Soft, nontender, nondistended. Bowel sounds present. No organomegaly or mass.  EXTREMITIES: 1-2+ pitting edema bilaterally.  No cyanosis, or clubbing.  NEUROLOGIC: Cranial nerves II through XII are intact. Muscle strength 5/5 in all extremities. Sensation intact. Gait not checked.  PSYCHIATRIC: The  patient is alert and oriented x 3.  SKIN: No obvious rash, lesion, or ulcer.   LABORATORY PANEL:   CBC Recent Labs  Lab 02/23/19 1305  WBC 10.9*  HGB 8.4*  HCT 28.0*  PLT 293   ------------------------------------------------------------------------------------------------------------------  Chemistries  Recent Labs  Lab 02/23/19 1305  NA 138  K 4.0  CL 101  CO2 28  GLUCOSE 161*  BUN 44*  CREATININE 1.79*  CALCIUM 8.6*  AST 24  ALT 22  ALKPHOS 82  BILITOT 1.1   ------------------------------------------------------------------------------------------------------------------  Cardiac Enzymes Recent Labs  Lab 02/23/19 1613  TROPONINI 0.08*   ------------------------------------------------------------------------------------------------------------------  RADIOLOGY:  Dg Chest Port 1 View  Result Date:  02/23/2019 CLINICAL DATA:  Shortness of breath EXAM: PORTABLE CHEST 1 VIEW COMPARISON:  Jan 28, 2019 and January 09, 2019 FINDINGS: There is interstitial edema throughout the lungs, essentially stable. There are small pleural effusions bilaterally. There is no frank airspace consolidation. There is cardiomegaly with pulmonary venous hypertension. Pacemaker leads are attached to the right atrium and right ventricle. There is aortic atherosclerosis. No adenopathy. No bone lesions. IMPRESSION: Pulmonary vascular congestion with small pleural effusions and interstitial edema. Appearance is felt to be indicative of a degree of congestive heart failure. There may well be superimposed noncardiogenic interstitial pneumonitis, although there is no frank airspace consolidation. Pacemaker leads attached to right atrium and right ventricle. Aortic Atherosclerosis (ICD10-I70.0). Electronically Signed   By: Lowella Grip III M.D.   On: 02/23/2019 13:25      IMPRESSION AND PLAN:  Patient is a 73 year old male with history of chronic atrial fibrillation on anticoagulation with Eliquis, chronic kidney disease stage III, COPD, prior history of AICD, chronic diastolic CHF and prior history of asthma admitted with acute hypoxic respiratory failure secondary to CHF exacerbation.  1.  Acute hypoxic respiratory failure secondary to CHF exacerbation. Patient initially required nonrebreather.  Has been weaned down to oxygen via nasal cannula at 6 L. Continue management of CHF as outlined below.  Wean down oxygen requirement as tolerated.  2.  Acute on chronic diastolic CHF exacerbation.   Continue diuresis with IV Lasix.  So far no evidence of acute coronary syndrome. Patient had recent 2D echocardiogram in February 2020 with ejection fraction of 50 to 55%.  No indication to repeat at this time.  Started on losartan.  Continue Toprol-XL. Consulted cardiologist.  3.  Chronic atrial fibrillation Rate controlled.  Continue  anticoagulation with Eliquis  4.  Chronic kidney disease stage III Monitor renal function closely with ongoing diuresis.  5.  Mildly elevated troponin of 0.08 Stable.  Most likely due to supply demand ischemia from CHF.  DVT prophylaxis; patient on Eliquis already  All the records are reviewed and case discussed with ED provider. Management plans discussed with the patient, family and they are in agreement.  CODE STATUS: Full code  TOTAL TIME TAKING CARE OF THIS PATIENT: 59 minutes.    Rahil Passey M.D on 02/23/2019 at 5:52 PM  Between 7am to 6pm - Pager - [phone]  After 6pm go to [email or web address] - [employer] [facility]  Office  [phone]  CC: Primary care physician; [employer], [employer]   Note: This dictation was prepared with Diplomatic Services operational officer dictation along with smaller Company secretary. Any transcriptional errors that result from this process are unintentional.

## 2019-02-23 NOTE — ED Triage Notes (Signed)
Patient presents to the ED via EMS from home.  Patient has history of multiple episodes of pneumonia.  Patient complaining today of increased shortness of breath.  Patient wears 3L/[place] at home and per EMS O2 saturation was in hte 70s on [place].  EMS placed patient on 15l/non-rebreather.  Patient denies pain.  Per EMS, crackles heard on right side.

## 2019-02-23 NOTE — ED Provider Notes (Signed)
[facility] Emergency Department Provider Note   ____________________________________________    I have reviewed the triage vital signs and the nursing notes.   HISTORY  Chief Complaint Shortness of Breath     HPI Darius Taylor is a 73 y.o. male who presents with complaints of respiratory distress.  Reportedly the patient had oxygen saturations in the 80s upon arrival of EMS, they started him on a nonrebreather and attempted to transition him to nasal cannula unsuccessfully.  Patient has extensive past medical history including A. fib, CAD, CHF, COPD.  Upon arrival to the emergency department reports his breathing is somewhat better.  Denies fevers or chills or cough.  No nausea or vomiting or diaphoresis.  No chest pain.  Reports lower extremity swelling  Past Medical History:  Diagnosis Date   Aortic stenosis    Arrhythmia    atrial fibrillation   Asthma    Atrial fibrillation (HCC)    CAD (coronary artery disease)    Cancer (HCC)    colon, bladder and kidney   CHF (congestive heart failure) (HCC)    Colon adenocarcinoma (HCC)    COPD (chronic obstructive pulmonary disease) ([place])    H/O ventricular tachycardia    Hypertension    Nonischemic cardiomyopathy (HCC)    Status post AICD and pacemaker   Renal cell carcinoma [facility])     Patient Active Problem List   Diagnosis Date Noted   Diastolic CHF, acute on chronic ([place]) 01/25/2019   CAD (coronary artery disease) 12/30/2018   Acute on chronic renal failure ([place]) 71/24/5809   Alcoholic intoxication without complication ([place]) 98/33/8250   CHF exacerbation (HCC) 11/03/2018   Chronic diastolic heart failure ([place]) 09/24/2018   HTN (hypertension) 09/24/2018   Acute on chronic respiratory failure with hypoxemia ([place]) 08/30/2018   History of colon cancer    COPD (chronic obstructive pulmonary disease) ([place]) 01/08/2018   Cellulitis 11/18/2017   Hematoma     Past  Surgical History:  Procedure Laterality Date   CARDIAC DEFIBRILLATOR PLACEMENT     CARDIAC DEFIBRILLATOR PLACEMENT     COLONOSCOPY WITH PROPOFOL N/A 06/10/2018   Procedure: COLONOSCOPY WITH PROPOFOL;  Surgeon: Lin Landsman, MD;  Location: [facility] ENDOSCOPY;  Service: Gastroenterology;  Laterality: N/A;   CORONARY ANGIOPLASTY  09/25/2016   IRRIGATION AND DEBRIDEMENT HEMATOMA Left 10/24/2016   Procedure: IRRIGATION AND DEBRIDEMENT HEMATOMA;  Surgeon: Florene Glen, MD;  Location: [facility] ORS;  Service: General;  Laterality: Left;   KIDNEY SURGERY     left ne[hrectomy for RCC   PACEMAKER IMPLANT     PARTIAL COLECTOMY      Prior to Admission medications   Medication Sig Start Date End Date Taking? Authorizing Provider  acetaminophen (TYLENOL) 325 MG tablet Take 650 mg by mouth every 6 (six) hours as needed for mild pain or fever.     [provider]  amiodarone (PACERONE) 200 MG tablet Take 200 mg by mouth daily.    [provider]  amLODipine (NORVASC) 10 MG tablet Take 10 mg by mouth daily.    [provider]  apixaban (ELIQUIS) 5 MG TABS tablet Take 5 mg by mouth 2 (two) times daily.    [provider]  aspirin 81 MG chewable tablet Chew 81 mg by mouth daily.    [provider]  atorvastatin (LIPITOR) 80 MG tablet Take 80 mg by mouth daily. 12/02/18   [provider]  brimonidine (ALPHAGAN) 0.15 % ophthalmic solution Place 1 drop  into both eyes 3 (three) times daily. 12/20/17   [provider]  cholecalciferol (VITAMIN D) 1000 units tablet Take 1,000 Units by mouth daily.    [provider]  furosemide (LASIX) 40 MG tablet Take 1 tablet (40 mg total) by mouth 2 (two) times daily. 01/21/19   Bettey Costa, MD  latanoprost (XALATAN) 0.005 % ophthalmic solution Place 1 drop into both eyes at bedtime. 12/20/17   [provider]  magnesium oxide (MAG-OX) 400 MG tablet Take 400 mg by mouth 2 (two) times daily.     [provider]  metoprolol (TOPROL-XL) 200 MG 24 hr tablet Take 200 mg by mouth daily.    [provider]  nitroGLYCERIN (NITROSTAT) 0.4 MG SL tablet Place 0.4 mg under the tongue every 5 (five) minutes as needed for chest pain.    [provider]  potassium chloride (K-DUR) 10 MEQ tablet Take 1 tablet (10 mEq total) by mouth daily. 01/21/19   Bettey Costa, MD  tiotropium (SPIRIVA) 18 MCG inhalation capsule Place 18 mcg into inhaler and inhale daily.    [provider]     Allergies Patient has no known allergies.  Family History  Problem Relation Age of Onset   Hypertension Mother    Stroke Father     Social History Social History   Tobacco Use   Smoking status: Former Smoker   Smokeless tobacco: Never Used  Substance Use Topics   Alcohol use: No   Drug use: No    Review of Systems  Constitutional: No fever/chills Eyes: No visual changes.  ENT: No sore throat. Cardiovascular: Denies chest pain. Respiratory: As above Gastrointestinal: No abdominal pain.   Genitourinary: Negative for dysuria. Musculoskeletal: Negative for back pain. Skin: Negative for rash. Neurological: Negative for headaches   ____________________________________________   PHYSICAL EXAM:  VITAL SIGNS: ED Triage Vitals  Enc Vitals Group     BP 02/23/19 1255 (!) 132/98     Pulse Rate 02/23/19 1255 (!) 53     Resp 02/23/19 1255 (!) 22     Temp 02/23/19 1255 97.6 F (36.4 C)     Temp Source 02/23/19 1255 Oral     SpO2 02/23/19 1255 97 %     Weight 02/23/19 1256 97.5 kg (215 lb)     Height 02/23/19 1256 1.803 m (5\' 11" )     Head Circumference --      Peak Flow --      Pain Score 02/23/19 1256 0     Pain Loc --      Pain Edu? --      Excl. in [place]? --     Constitutional: Alert and oriented.     Mouth/Throat: Mucous membranes are moist.    Cardiovascular: Normal rate, irregular rhythm. Grossly normal heart sounds.  Good peripheral  circulation. Respiratory: Significantly increased respiratory effort with tachypnea, no retractions, bibasilar Rales Gastrointestinal: Soft and nontender. No distention.  .  Musculoskeletal: 1+ bilateral edema.  Warm and well perfused Neurologic:  Normal speech and language. No gross focal neurologic deficits are appreciated.  Skin:  Skin is warm, dry and intact. No rash noted. Psychiatric: Mood and affect are normal. Speech and behavior are normal.  ____________________________________________   LABS (all labs ordered are listed, but only abnormal results are displayed)  Labs Reviewed  COMPREHENSIVE METABOLIC PANEL - Abnormal; Notable for the following components:      Result Value   Glucose, Bld 161 (*)    BUN 44 (*)  Creatinine, Ser 1.79 (*)    Calcium 8.6 (*)    Albumin 3.3 (*)    GFR calc non Af Amer 37 (*)    GFR calc Af Amer 42 (*)    All other components within normal limits  CBC - Abnormal; Notable for the following components:   WBC 10.9 (*)    RBC 2.88 (*)    Hemoglobin 8.4 (*)    HCT 28.0 (*)    RDW 15.9 (*)    All other components within normal limits  TROPONIN I - Abnormal; Notable for the following components:   Troponin I 0.08 (*)    All other components within normal limits  BRAIN NATRIURETIC PEPTIDE - Abnormal; Notable for the following components:   B Natriuretic Peptide 686.0 (*)    All other components within normal limits  SARS CORONAVIRUS 2 (HOSPITAL ORDER, [place] [facility])  CULTURE, BLOOD (ROUTINE X 2)  CULTURE, BLOOD (ROUTINE X 2)  LACTIC ACID, PLASMA   ____________________________________________  EKG    ____________________________________________  RADIOLOGY  Chest x-ray demonstrates pulmonary vascular congestion with small pleural effusions and interstitial edema ____________________________________________   PROCEDURES  Procedure(s) performed: No  Procedures   Critical Care performed: yes  CRITICAL  CARE Performed by: Lavonia Drafts   Total critical care time: 79minutes  Critical care time was exclusive of separately billable procedures and treating other patients.  Critical care was necessary to treat or prevent imminent or life-threatening deterioration.  Critical care was time spent personally by me on the following activities: development of treatment plan with patient and/or surrogate as well as nursing, discussions with consultants, evaluation of patient's response to treatment, examination of patient, obtaining history from patient or surrogate, ordering and performing treatments and interventions, ordering and review of laboratory studies, ordering and review of radiographic studies, pulse oximetry and re-evaluation of patient's condition.  ____________________________________________   INITIAL IMPRESSION / ASSESSMENT AND PLAN / ED COURSE  Pertinent labs & imaging results that were available during my care of the patient were reviewed by me and considered in my medical decision making (see chart for details).  Patient presents with significant shortness of breath/respiratory failure, requiring nonrebreather to keep sats above 90%.  Presentation is most concerning for CHF/pulmonary edema given lower extremity edema, worsening shortness of breath, crackles on exam.  Chest x-ray confirms pulmonary edema, troponin is mildly elevated related to strain, patient is doing better now and is on nasal cannula, we will treat with Lasix and admit to the hospitalist service    ____________________________________________   FINAL CLINICAL IMPRESSION(S) / ED DIAGNOSES  Final diagnoses:  Acute on chronic congestive heart failure, unspecified heart failure type ([place])  Acute pulmonary edema (HCC)  Elevated troponin        Note:  This document was prepared using Dragon voice recognition software and may include unintentional dictation errors.   Lavonia Drafts, MD 02/23/19 [phone]

## 2019-02-23 NOTE — ED Notes (Signed)
ED TO INPATIENT HANDOFF REPORT  ED Nurse Name and Phone #: [place] 1  S Name/Age/Gender Darius Taylor 73 y.o. male Room/Bed: ED06A/ED06A  Code Status   Code Status: Full Code  Home/SNF/Other Home Patient oriented to: self, place, time and situation Is this baseline? Yes   Triage Complete: Triage complete  Chief Complaint sob ems  Triage Note Patient presents to the ED via EMS from home.  Patient has history of multiple episodes of pneumonia.  Patient complaining today of increased shortness of breath.  Patient wears 3L/[name] at home and per EMS O2 saturation was in hte 70s on [place].  EMS placed patient on 15l/non-rebreather.  Patient denies pain.  Per EMS, crackles heard on right side.     Allergies No Known Allergies  Level of Care/Admitting Diagnosis ED Disposition    ED Disposition Condition [facility]: [place] [[id number]]  Level of Care: Telemetry [5]  Covid Evaluation: Confirmed COVID Negative  Diagnosis: Acute on chronic diastolic CHF (congestive heart failure) [facility]) [[id number]]  Admitting Physician: Otila Back [[id number]]  Attending Physician: Otila Back [[id number]]  Estimated length of stay: past midnight tomorrow  Certification:: I certify this patient will need inpatient services for at least 2 midnights  PT Class (Do Not Modify): Inpatient [101]  PT Acc Code (Do Not Modify): Private [1]       B Medical/Surgery History Past Medical History:  Diagnosis Date  . Aortic stenosis   . Arrhythmia    atrial fibrillation  . Asthma   . Atrial fibrillation ([place])   . CAD (coronary artery disease)   . Cancer [facility])    colon, bladder and kidney  . CHF (congestive heart failure) ([place])   . 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 COPD (chronic obstructive pulmonary disease) ([place])   . H/O ventricular tachycardia   . Hypertension   . Nonischemic cardiomyopathy ([place])    Status post AICD and pacemaker  . Renal cell carcinoma [facility])    Past  Surgical History:  Procedure Laterality Date  . CARDIAC DEFIBRILLATOR PLACEMENT    . CARDIAC DEFIBRILLATOR PLACEMENT    . COLONOSCOPY WITH PROPOFOL N/A 06/10/2018   Procedure: COLONOSCOPY WITH PROPOFOL;  Surgeon: Lin Landsman, MD;  Location: [facility] ENDOSCOPY;  Service: Gastroenterology;  Laterality: N/A;  . CORONARY ANGIOPLASTY  09/25/2016  . IRRIGATION AND DEBRIDEMENT HEMATOMA Left 10/24/2016   Procedure: IRRIGATION AND DEBRIDEMENT HEMATOMA;  Surgeon: Florene Glen, MD;  Location: [facility] ORS;  Service: General;  Laterality: Left;  . KIDNEY SURGERY     left ne[hrectomy for RCC  . PACEMAKER IMPLANT    . PARTIAL COLECTOMY       A IV Location/Drains/Wounds Patient Lines/Drains/Airways Status   Active Line/Drains/Airways    Name:   Placement date:   Placement time:   Site:   Days:   Peripheral IV 02/23/19 Right Antecubital   02/23/19    1328    Antecubital   less than 1   Peripheral IV 02/23/19 Wrist   02/23/19    1258    Wrist   less than 1          Intake/Output Last 24 hours  Intake/Output Summary (Last 24 hours) at 02/23/2019 1700 Last data filed at 02/23/2019 1545 Gross per 24 hour  Intake -  Output 200 ml  Net -200 ml    Labs/Imaging Results for orders placed or performed during the hospital encounter of 02/23/19 (from the past 48 hour(s))  Comprehensive  metabolic panel     Status: Abnormal   Collection Time: 02/23/19  1:05 PM  Result Value Ref Range   Sodium 138 135 - 145 mmol/L   Potassium 4.0 3.5 - 5.1 mmol/L   Chloride 101 98 - 111 mmol/L   CO2 28 22 - 32 mmol/L   Glucose, Bld 161 (H) 70 - 99 mg/dL   BUN 44 (H) 8 - 23 mg/dL   Creatinine, Ser 1.79 (H) 0.61 - 1.24 mg/dL   Calcium 8.6 (L) 8.9 - 10.3 mg/dL   Total Protein 7.9 6.5 - 8.1 g/dL   Albumin 3.3 (L) 3.5 - 5.0 g/dL   AST 24 15 - 41 U/L   ALT 22 0 - 44 U/L   Alkaline Phosphatase 82 38 - 126 U/L   Total Bilirubin 1.1 0.3 - 1.2 mg/dL   GFR calc non Af Amer 37 (L) >60 mL/min   GFR calc Af Amer 42 (L) >60  mL/min   Anion gap 9 5 - 15    Comment: Performed at [facility], [address] [place], [place] [address]  CBC     Status: Abnormal   Collection Time: 02/23/19  1:05 PM  Result Value Ref Range   WBC 10.9 (H) 4.0 - 10.5 K/uL   RBC 2.88 (L) 4.22 - 5.81 MIL/uL   Hemoglobin 8.4 (L) 13.0 - 17.0 g/dL   HCT 28.0 (L) 39.0 - 52.0 %   MCV 97.2 80.0 - 100.0 fL   MCH 29.2 26.0 - 34.0 pg   MCHC 30.0 30.0 - 36.0 g/dL   RDW 15.9 (H) 11.5 - 15.5 %   Platelets 293 150 - 400 K/uL   nRBC 0.0 0.0 - 0.2 %    Comment: Performed at [facility], [address] [place], [place] [address]  Troponin I - ONCE - STAT     Status: Abnormal   Collection Time: 02/23/19  1:05 PM  Result Value Ref Range   Troponin I 0.08 (HH) <0.03 ng/mL    Comment: CRITICAL RESULT CALLED TO, READ BACK BY AND VERIFIED WITH ANNA HOLT AT 1336 ON 02/23/19 KLM Performed at [facility], [address] [place], [place] [address]   SARS Coronavirus 2 (CEPHEID- Performed in [place] hospital lab), Hosp Order     Status: None   Collection Time: 02/23/19  1:05 PM  Result Value Ref Range   SARS Coronavirus 2 NEGATIVE NEGATIVE    Comment: (NOTE) If result is NEGATIVE SARS-CoV-2 target nucleic acids are NOT DETECTED. The SARS-CoV-2 RNA is generally detectable in upper and lower  respiratory specimens during the acute phase of infection. The lowest  concentration of SARS-CoV-2 viral copies this assay can detect is 250  copies / mL. A negative result does not preclude SARS-CoV-2 infection  and should not be used as the sole basis for treatment or other  patient management decisions.  A negative result may occur with  improper specimen collection / handling, submission of specimen other  than nasopharyngeal swab, presence of viral mutation(s) within the  areas targeted by this assay, and inadequate number of viral copies  (<250 copies / mL). A negative result must be combined with clinical  observations,  patient history, and epidemiological information. If result is POSITIVE SARS-CoV-2 target nucleic acids are DETECTED. The SARS-CoV-2 RNA is generally detectable in upper and lower  respiratory specimens dur ing the acute phase of infection.  Positive  results are indicative of active infection with SARS-CoV-2.  Clinical  correlation with patient history and other diagnostic information is  necessary to determine patient infection status.  Positive results do  not rule out bacterial infection or co-infection with other viruses. If result is PRESUMPTIVE POSTIVE SARS-CoV-2 nucleic acids MAY BE PRESENT.   A presumptive positive result was obtained on the submitted specimen  and confirmed on repeat testing.  While 2019 novel coronavirus  (SARS-CoV-2) nucleic acids may be present in the submitted sample  additional confirmatory testing may be necessary for epidemiological  and / or clinical management purposes  to differentiate between  SARS-CoV-2 and other Sarbecovirus currently known to infect humans.  If clinically indicated additional testing with an alternate test  methodology [phone]) is advised. The SARS-CoV-2 RNA is generally  detectable in upper and lower respiratory sp ecimens during the acute  phase of infection. The expected result is Negative. Fact Sheet for Patients:  StrictlyIdeas.no Fact Sheet for Healthcare Providers: BankingDealers.co.za This test is not yet approved or cleared by the [place] FDA and has been authorized for detection and/or diagnosis of SARS-CoV-2 by FDA under an Emergency Use Authorization (EUA).  This EUA will remain in effect (meaning this test can be used) for the duration of the COVID-19 declaration under Section 564(b)(1) of the Act, 21 U.S.C. section 360bbb-3(b)(1), unless the authorization is terminated or revoked sooner. Performed at [facility], [address]  [place], [place] [address]   Lactic acid, plasma     Status: None   Collection Time: 02/23/19  1:05 PM  Result Value Ref Range   Lactic Acid, Venous 1.9 0.5 - 1.9 mmol/L    Comment: Performed at [facility], [address] [place], [place] [address]  Brain natriuretic peptide     Status: Abnormal   Collection Time: 02/23/19  1:05 PM  Result Value Ref Range   B Natriuretic Peptide 686.0 (H) 0.0 - 100.0 pg/mL    Comment: Performed at [facility], [address] [place], [place] [address]   Dg Chest Port 1 View  Result Date: 02/23/2019 CLINICAL DATA:  Shortness of breath EXAM: PORTABLE CHEST 1 VIEW COMPARISON:  Jan 28, 2019 and January 09, 2019 FINDINGS: There is interstitial edema throughout the lungs, essentially stable. There are small pleural effusions bilaterally. There is no frank airspace consolidation. There is cardiomegaly with pulmonary venous hypertension. Pacemaker leads are attached to the right atrium and right ventricle. There is aortic atherosclerosis. No adenopathy. No bone lesions. IMPRESSION: Pulmonary vascular congestion with small pleural effusions and interstitial edema. Appearance is felt to be indicative of a degree of congestive heart failure. There may well be superimposed noncardiogenic interstitial pneumonitis, although there is no frank airspace consolidation. Pacemaker leads attached to right atrium and right ventricle. Aortic Atherosclerosis (ICD10-I70.0). Electronically Signed   By: Lowella Grip III M.D.   On: 02/23/2019 13:25    Pending Labs Unresulted Labs (From admission, onward)    Start     Ordered   02/24/19 9381  Basic metabolic panel  Daily,   STAT     02/23/19 1509   02/24/19 8299  Basic metabolic panel  Tomorrow morning,   STAT     02/23/19 1511   02/24/19 0500  CBC  Tomorrow morning,   STAT     02/23/19 1511   02/24/19 0500  Magnesium  Tomorrow morning,   STAT     02/23/19 1511   02/23/19 1911  Troponin I - Once  Once,   STAT  02/23/19 1511   02/23/19 1611  Troponin I - Once  Once,   STAT     02/23/19 1511   02/23/19 1259  Blood culture (routine x 2)  BLOOD CULTURE X 2,   STAT     02/23/19 1258          Vitals/Pain Today's Vitals   02/23/19 1436 02/23/19 1447 02/23/19 1500 02/23/19 1530  BP:   140/71 (!) 160/62  Pulse:   (!) 59 65  Resp:   (!) 21   Temp:      TempSrc:      SpO2:  92% 90% 94%  Weight:      Height:      PainSc: 0-No pain       Isolation Precautions Droplet and Contact precautions  Medications Medications  nitroGLYCERIN (NITROGLYN) 2 % ointment 0.5 inch (0.5 inches Topical Given 02/23/19 1435)  aspirin chewable tablet 81 mg (81 mg Oral Not Given 02/23/19 1614)  amiodarone (PACERONE) tablet 200 mg (has no administration in time range)  atorvastatin (LIPITOR) tablet 80 mg (has no administration in time range)  metoprolol succinate (TOPROL-XL) 24 hr tablet 200 mg (has no administration in time range)  nitroGLYCERIN (NITROSTAT) SL tablet 0.4 mg (has no administration in time range)  magnesium oxide (MAG-OX) tablet 400 mg (has no administration in time range)  apixaban (ELIQUIS) tablet 5 mg (has no administration in time range)  cholecalciferol (VITAMIN D) tablet 1,000 Units (has no administration in time range)  potassium chloride (K-DUR) CR tablet 10 mEq (has no administration in time range)  tiotropium (SPIRIVA) inhalation capsule ([facility] use ONLY) 18 mcg (has no administration in time range)  brimonidine (ALPHAGAN) 0.15 % ophthalmic solution 1 drop (has no administration in time range)  latanoprost (XALATAN) 0.005 % ophthalmic solution 1 drop (has no administration in time range)  sodium chloride flush (NS) 0.9 % injection 3 mL (has no administration in time range)  sodium chloride flush (NS) 0.9 % injection 3 mL (has no administration in time range)  0.9 %  sodium chloride infusion (has no administration in time range)  acetaminophen (TYLENOL) tablet 650 mg (has no administration in  time range)  ondansetron (ZOFRAN) injection 4 mg (has no administration in time range)  furosemide (LASIX) injection 40 mg (has no administration in time range)  losartan (COZAAR) tablet 25 mg (has no administration in time range)  furosemide (LASIX) injection 80 mg (80 mg Intravenous Given 02/23/19 1435)    Mobility walks Low fall risk   Focused Assessments    R Recommendations: See Admitting Provider Note  Report given to:   Additional Notes:

## 2019-02-24 ENCOUNTER — Encounter: Payer: Self-pay | Admitting: *Deleted

## 2019-02-24 LAB — CBC
HCT: 27.3 % — ABNORMAL LOW (ref 39.0–52.0)
Hemoglobin: 8.1 g/dL — ABNORMAL LOW (ref 13.0–17.0)
MCH: 28.9 pg (ref 26.0–34.0)
MCHC: 29.7 g/dL — ABNORMAL LOW (ref 30.0–36.0)
MCV: 97.5 fL (ref 80.0–100.0)
Platelets: 290 10*3/uL (ref 150–400)
RBC: 2.8 MIL/uL — ABNORMAL LOW (ref 4.22–5.81)
RDW: 15.9 % — ABNORMAL HIGH (ref 11.5–15.5)
WBC: 10.6 10*3/uL — ABNORMAL HIGH (ref 4.0–10.5)
nRBC: 0 % (ref 0.0–0.2)

## 2019-02-24 LAB — BASIC METABOLIC PANEL
Anion gap: 10 (ref 5–15)
BUN: 42 mg/dL — ABNORMAL HIGH (ref 8–23)
CO2: 29 mmol/L (ref 22–32)
Calcium: 8.7 mg/dL — ABNORMAL LOW (ref 8.9–10.3)
Chloride: 102 mmol/L (ref 98–111)
Creatinine, Ser: 1.63 mg/dL — ABNORMAL HIGH (ref 0.61–1.24)
GFR calc Af Amer: 47 mL/min — ABNORMAL LOW (ref >60)
GFR calc non Af Amer: 41 mL/min — ABNORMAL LOW (ref >60)
Glucose, Bld: 116 mg/dL — ABNORMAL HIGH (ref 70–99)
Potassium: 4.1 mmol/L (ref 3.5–5.1)
Sodium: 141 mmol/L (ref 135–145)

## 2019-02-24 LAB — MAGNESIUM: Magnesium: 2.4 mg/dL (ref 1.7–2.4)

## 2019-02-24 MED ORDER — DIPHENHYDRAMINE HCL 25 MG PO CAPS
25.0000 mg | ORAL_CAPSULE | Freq: Every evening | ORAL | Status: DC | PRN
  Administered 2019-02-24 – 2019-02-28 (×3): 25 mg via ORAL
  Filled 2019-02-24 (×3): qty 1

## 2019-02-24 NOTE — Progress Notes (Signed)
Pt complaining he is not able to go to sleep, doesn't take anything at home to help him sleep, MD paged, Dr. Jannifer [place] put in orders for benadryl PRN for sleep. Will give and continue to monitor. Conley Simmonds, RN, BSN

## 2019-02-24 NOTE — Plan of Care (Signed)
  Problem: Health Behavior/Discharge Planning: Goal: Ability to manage health-related needs will improve Outcome: Progressing   Problem: Clinical Measurements: Goal: Will remain free from infection Outcome: Progressing Goal: Respiratory complications will improve Outcome: Progressing   Problem: Activity: Goal: Risk for activity intolerance will decrease Outcome: Progressing   Problem: Elimination: Goal: Will not experience complications related to bowel motility Outcome: Progressing Goal: Will not experience complications related to urinary retention Outcome: Progressing   Problem: Pain Managment: Goal: General experience of comfort will improve Outcome: Progressing

## 2019-02-24 NOTE — Progress Notes (Signed)
Ch visited pt to see how well he was progressing. Pt presented to have labored breathing while laying in bed. Ch asked guided question about what lead to pt rehospitalization. Pt shared that his H-H nurse noticed that he was having SOB still. Pt shared that he had fluid around his heart. Ch understood pt concerns and encouraged him to let the care team know if he had any needs.    02/24/19 1100  Clinical Encounter Type  Visited With Patient  Visit Type Psychological support;Spiritual support;Social support  Spiritual Encounters  Spiritual Needs Grief support;Emotional  Stress Factors  Patient Stress Factors Health changes;Loss of control;Lack of caregivers  Family Stress Factors None identified

## 2019-02-24 NOTE — Plan of Care (Signed)
  Problem: Education: Goal: Knowledge of General Education information will improve Description Including pain rating scale, medication(s)/side effects and non-pharmacologic comfort measures Outcome: Progressing   Problem: Health Behavior/Discharge Planning: Goal: Ability to manage health-related needs will improve Outcome: Progressing   Problem: Clinical Measurements: Goal: Ability to maintain clinical measurements within normal limits will improve Outcome: Progressing Goal: Will remain free from infection Outcome: Progressing Note:  Remains afebrile   Problem: Clinical Measurements: Goal: Diagnostic test results will improve Outcome: Not Progressing Note:  BNP 686, troponins .08x3 BUN 42/1.63 Goal: Respiratory complications will improve Outcome: Not Progressing Note:  Remains diminished, currently on 5L o2

## 2019-02-24 NOTE — Consult Note (Signed)
[facility] Cardiology  CARDIOLOGY CONSULT NOTE  Patient ID: Darius Taylor MRN: [id number] DOB/AGE: Sep 08, 1946 73 y.o.  Admit date: 02/23/2019 Referring Physician [facility], [facility] Primary Cardiologist Dr. Marlou Sa, [facility] Reason for Consultation Acute on chronic diastolic CHF  HPI: 73 year old male referred for evaluation of acute on chronic diastolic CHF.  The patient has a history of coronary artery disease status post PCI with BMS x2 proximal LAD in 01/2012, nonischemic dilated cardiomyopathy, history of ventricular tachycardia, status post AICD in 2006, aortic stenosis, status post TAVR in 2018, atrial fibrillation on Eliquis, chronic kidney disease stage III, lung disease, COPD with 3L supplemental oxygen chronically.  The patient reports progressive shortness of breath and peripheral edema which started 2 days ago.  His home health nurse came for a visit and noted that the patient was hypoxic, and advised him to come to the ER. Of note, the patient was admitted 01/25/2019 for the same. Chest x-ray revealed findings consistent with pulmonary vascular congestion with small pleural effusion and interstitial edema.  The patient was treated for acute hypoxic respiratory failure secondary to CHF exacerbation with IV Lasix and supplemental oxygen.  Admission labs notable for borderline elevated troponin of 0.082, BNP 686, hemoglobin 8.4, hematocrit 28, creatinine 1.79, BUN 44, GFR 42.  The patient denies any recent chest pain. 2D echocardiogram 10/2018 revealed LVEF 50 to 55% with moderate tricuspid regurgitation and mild aortic regurgitation. Currently, the patient reports that he is feeling better, just weak. He denies any recent chest pain or palpitations.  Review of systems complete and found to be negative unless listed above     Past Medical History:  Diagnosis Date  . Aortic stenosis   . Arrhythmia    atrial fibrillation  . Asthma   . Atrial fibrillation ([place])   . CAD  (coronary artery disease)   . Cancer [facility])    colon, bladder and kidney  . CHF (congestive heart failure) ([place])   . 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 COPD (chronic obstructive pulmonary disease) ([place])   . H/O ventricular tachycardia   . Hypertension   . Nonischemic cardiomyopathy ([place])    Status post AICD and pacemaker  . Renal cell carcinoma [facility])     Past Surgical History:  Procedure Laterality Date  . CARDIAC DEFIBRILLATOR PLACEMENT    . CARDIAC DEFIBRILLATOR PLACEMENT    . COLONOSCOPY WITH PROPOFOL N/A 06/10/2018   Procedure: COLONOSCOPY WITH PROPOFOL;  Surgeon: Lin Landsman, MD;  Location: [facility] ENDOSCOPY;  Service: Gastroenterology;  Laterality: N/A;  . CORONARY ANGIOPLASTY  09/25/2016  . IRRIGATION AND DEBRIDEMENT HEMATOMA Left 10/24/2016   Procedure: IRRIGATION AND DEBRIDEMENT HEMATOMA;  Surgeon: Florene Glen, MD;  Location: [facility] ORS;  Service: General;  Laterality: Left;  . KIDNEY SURGERY     left ne[hrectomy for RCC  . PACEMAKER IMPLANT    . PARTIAL COLECTOMY      Medications Prior to Admission  Medication Sig Dispense Refill Last Dose  . acetaminophen (TYLENOL) 325 MG tablet Take 650 mg by mouth every 6 (six) hours as needed for mild pain or fever.    prn at prn  . allopurinol (ZYLOPRIM) 100 MG tablet Take 200 mg by mouth daily.   02/23/2019 at 1000  . amiodarone (PACERONE) 200 MG tablet Take 200 mg by mouth daily.   02/23/2019 at 1000  . amLODipine (NORVASC) 10 MG tablet Take 10 mg by mouth daily.   02/23/2019 at 1000  . apixaban (ELIQUIS) 5 MG TABS  tablet Take 5 mg by mouth 2 (two) times daily.   02/23/2019 at 1000  . aspirin 81 MG chewable tablet Chew 81 mg by mouth daily.   02/23/2019 at 1000  . atorvastatin (LIPITOR) 80 MG tablet Take 80 mg by mouth daily.   02/22/2019 at Unknown time  . brimonidine (ALPHAGAN) 0.15 % ophthalmic solution Place 1 drop into both eyes 3 (three) times daily.  12 02/23/2019 at Unknown time  . budesonide-formoterol (SYMBICORT) 160-4.5 MCG/ACT  inhaler Inhale 2 puffs into the lungs 2 (two) times daily.   02/23/2019 at Unknown time  . cholecalciferol (VITAMIN D) 1000 units tablet Take 1,000 Units by mouth daily.   02/23/2019 at Unknown time  . furosemide (LASIX) 40 MG tablet Take 1 tablet (40 mg total) by mouth 2 (two) times daily. (Patient taking differently: Take 40 mg by mouth 2 (two) times daily. May take an extra if needed) 30 tablet 0 02/23/2019 at 1000  . latanoprost (XALATAN) 0.005 % ophthalmic solution Place 1 drop into both eyes at bedtime.  7 02/22/2019 at Unknown time  . magnesium oxide (MAG-OX) 400 MG tablet Take 400 mg by mouth 2 (two) times daily.   02/23/2019 at Unknown time  . metoprolol (TOPROL-XL) 200 MG 24 hr tablet Take 200 mg by mouth daily.   02/23/2019 at 1000  . nitroGLYCERIN (NITROSTAT) 0.4 MG SL tablet Place 0.4 mg under the tongue every 5 (five) minutes as needed for chest pain.   prn at prn  . spironolactone (ALDACTONE) 25 MG tablet Take 12.5 mg by mouth daily.   02/23/2019 at Unknown time  . tiotropium (SPIRIVA) 18 MCG inhalation capsule Place 18 mcg into inhaler and inhale daily.   02/23/2019 at Unknown time   Social History   Socioeconomic History  . Marital status: Widowed    Spouse name: Not on file  . Number of children: Not on file  . Years of education: Not on file  . Highest education level: Not on file  Occupational History  . Not on file  Social Needs  . Financial resource strain: Not hard at all  . Food insecurity:    Worry: Not on file    Inability: Never true  . Transportation needs:    Medical: No    Non-medical: No  Tobacco Use  . Smoking status: Former Research scientist (life sciences)  . Smokeless tobacco: Never Used  Substance and Sexual Activity  . Alcohol use: No  . Drug use: No  . Sexual activity: Not on file  Lifestyle  . Physical activity:    Days per week: 3 days    Minutes per session: 10 min  . Stress: Not at all  Relationships  . Social connections:    Talks on phone: Patient refused    Gets  together: Patient refused    Attends religious service: Patient refused    Active member of club or organization: Not on file    Attends meetings of clubs or organizations: Patient refused    Relationship status: Patient refused  . Intimate partner violence:    Fear of current or ex partner: Patient refused    Emotionally abused: Patient refused    Physically abused: Patient refused    Forced sexual activity: Patient refused  Other Topics Concern  . Not on file  Social History Narrative   Lives at home by himself.  Independent at baseline    Family History  Problem Relation Age of Onset  . Hypertension Mother   . Stroke Father  Review of systems complete and found to be negative unless listed above      PHYSICAL EXAM  General: Well developed, well nourished, in no acute distress, lying in LLD position nearly flat in bed HEENT:  Normocephalic and atramatic Neck:  No JVD.  Lungs: normal effort of breathing on supplemental oxygen via nasal cannula, bibasilar crackles, no wheezing Heart: HRRR . 2/6 systolic murmur  Abdomen:nondistended Msk:  Back normal, gait not assessed. Normal strength and tone for age. Extremities: 1+ bilateral lower extremity edema.   Neuro: Alert and oriented X 3. Psych:  Good affect, responds appropriately  Labs:   Lab Results  Component Value Date   WBC 10.6 (H) 02/24/2019   HGB 8.1 (L) 02/24/2019   HCT 27.3 (L) 02/24/2019   MCV 97.5 02/24/2019   PLT 290 02/24/2019    Recent Labs  Lab 02/23/19 1305 02/24/19 0459  NA 138 141  K 4.0 4.1  CL 101 102  CO2 28 29  BUN 44* 42*  CREATININE 1.79* 1.63*  CALCIUM 8.6* 8.7*  PROT 7.9  --   BILITOT 1.1  --   ALKPHOS 82  --   ALT 22  --   AST 24  --   GLUCOSE 161* 116*   Lab Results  Component Value Date   CKTOTAL 63 11/18/2017   CKMB 1.5 10/19/2014   TROPONINI 0.08 (HH) 02/23/2019    Lab Results  Component Value Date   CHOL 121 07/25/2012   CHOL 94 01/05/2012   Lab Results   Component Value Date   HDL 48 07/25/2012   HDL 23 (L) 01/05/2012   Lab Results  Component Value Date   LDLCALC 50 07/25/2012   LDLCALC 41 01/05/2012   Lab Results  Component Value Date   TRIG 114 07/25/2012   TRIG 148 01/05/2012   No results found for: CHOLHDL No results found for: LDLDIRECT    Radiology: Dg Chest Port 1 View  Result Date: 02/23/2019 CLINICAL DATA:  Shortness of breath EXAM: PORTABLE CHEST 1 VIEW COMPARISON:  Jan 28, 2019 and January 09, 2019 FINDINGS: There is interstitial edema throughout the lungs, essentially stable. There are small pleural effusions bilaterally. There is no frank airspace consolidation. There is cardiomegaly with pulmonary venous hypertension. Pacemaker leads are attached to the right atrium and right ventricle. There is aortic atherosclerosis. No adenopathy. No bone lesions. IMPRESSION: Pulmonary vascular congestion with small pleural effusions and interstitial edema. Appearance is felt to be indicative of a degree of congestive heart failure. There may well be superimposed noncardiogenic interstitial pneumonitis, although there is no frank airspace consolidation. Pacemaker leads attached to right atrium and right ventricle. Aortic Atherosclerosis (ICD10-I70.0). Electronically Signed   By: Lowella Grip III M.D.   On: 02/23/2019 13:25   Dg Chest Port 1 View  Result Date: 01/28/2019 CLINICAL DATA:  Cough and shortness of breath for 1 day EXAM: PORTABLE CHEST 1 VIEW COMPARISON:  Three days ago FINDINGS: Dual-chamber ICD/pacer leads from the left that are stable. Cardiomegaly. Transcatheter aortic valve replacement. Generalized interstitial coarsening. There is mild airspace opacity at the right upper lobe, unchanged. IMPRESSION: Cardiomegaly and vascular congestion without convincing change from prior. There may be superimposed airspace disease. Electronically Signed   By: Monte Fantasia M.D.   On: 01/28/2019 07:07   Dg Chest Portable 1 View  Result  Date: 01/25/2019 CLINICAL DATA:  Dyspnea EXAM: PORTABLE CHEST 1 VIEW COMPARISON:  01/21/2019 chest radiograph. FINDINGS: Stable configuration of 2 lead left subclavian ICD. Stable  cardiomediastinal silhouette with moderate cardiomegaly. Cardiac valvular prosthesis is in place. No pneumothorax. No pleural effusion. Mild-to-moderate pulmonary edema, slightly improved. Stable mild left lung base patchy opacity. IMPRESSION: 1. Mild-to-moderate congestive heart failure, slightly improved. 2. Stable mild patchy left lung base opacity, favor atelectasis. Electronically Signed   By: Ilona Sorrel M.D.   On: 01/25/2019 14:25    EKG: Paced rhythm, 74 bpm  ASSESSMENT AND PLAN:  1. Acute hypoxic respiratory failure due to acute on chronic diastolic CHF and anemia, which presented as progressive shortness of breath, peripheral edema, hypoxia, and pleural effusions. Treated with supplemental oxygen and IV Lasix. Recent echocardiogram in 10/2018 revealed normal left ventricular function with LVEF 50-55% with moderate TR and mild AR.  2. Atrial fibrillation, on Eliquis, rate controlled 3. Coronary artery disease, status post PCI with BMS x2 proximal LAD in 01/2012, currently without chest pain, with troponin borderline elevated to 0.08 x 2, likely secondary to demand supply ischemia in the setting of acute on chronic CHF 4. Nonischemic dilated cardiomyopathy and history of ventricular tachycardia, status post AICD in 2006. ICD interrogated 10/2018 5. Aortic stenosis, status post TAVR in 2018, recent echo stable 6. CKD stage III 7. Anemia, hemoglobin 8.1, hematocrit 27.3. During admission 01/2019, patient's hemoglobin was 7.4; he received 1 dose of IV Venofer, and I unit of PRBC. Otelia Sergeant studies suggested iron deficiency with no acute bleeding. Needs outpatient monitoring.  Recommendations: 1. Agree with current therapy 2. Continue IV Lasix 40 mg BID with careful monitoring of renal status 3. Continue metoprolol, losartan,  atorvastatin 4. Continue Eliquis for stroke prevention, amiodarone for rhythm control 5. Monitor anemia closely, transfuse if needed.   Signed: Clabe Seal PA-C 02/24/2019, 10:51 AM

## 2019-02-24 NOTE — Progress Notes (Signed)
[place] at [facility]                                                                                                                                                                                  Patient Demographics   Darius Taylor, is a 73 y.o. male, DOB - [birth year], DJS:[id number]  Admit date - 02/23/2019   Admitting Physician Jude Stark Jock, MD  Outpatient Primary MD for the patient is [facility] [place]   LOS - 1  Subjective: Patient with some improvement in shortness of breath   Review of Systems:   CONSTITUTIONAL: No documented fever. No fatigue, weakness. No weight gain, no weight loss.  EYES: No blurry or double vision.  ENT: No tinnitus. No postnasal drip. No redness of the oropharynx.  RESPIRATORY: No cough, no wheeze, no hemoptysis.  Positive dyspnea.  CARDIOVASCULAR: No chest pain. No orthopnea. No palpitations. No syncope.  GASTROINTESTINAL: No nausea, no vomiting or diarrhea. No abdominal pain. No melena or hematochezia.  GENITOURINARY: No dysuria or hematuria.  ENDOCRINE: No polyuria or nocturia. No heat or cold intolerance.  HEMATOLOGY: No anemia. No bruising. No bleeding.  INTEGUMENTARY: No rashes. No lesions.  MUSCULOSKELETAL: No arthritis. No swelling. No gout.  NEUROLOGIC: No numbness, tingling, or ataxia. No seizure-type activity.  PSYCHIATRIC: No anxiety. No insomnia. No ADD.    Vitals:   Vitals:   02/23/19 2115 02/23/19 2334 02/24/19 0535 02/24/19 0808  BP: 102/65 128/61 105/68 (!) 148/70  Pulse: 65 66 81 60  Resp:    18  Temp: 98.1 F (36.7 C) (!) 97.5 F (36.4 C) 97.7 F (36.5 C) 98.2 F (36.8 C)  TempSrc: Oral Oral Oral Oral  SpO2: 97% 93% 94% 95%  Weight:   96.9 kg   Height:        Wt Readings from Last 3 Encounters:  02/24/19 96.9 kg  02/05/19 96.2 kg  02/04/19 96.3 kg     Intake/Output Summary (Last 24 hours) at 02/24/2019 1412 Last data filed at 02/24/2019 1006 Gross per 24 hour  Intake 363 ml   Output 2475 ml  Net -2112 ml    Physical Exam:   GENERAL: Pleasant-appearing in no apparent distress.  HEAD, EYES, EARS, NOSE AND THROAT: Atraumatic, normocephalic. Extraocular muscles are intact. Pupils equal and reactive to light. Sclerae anicteric. No conjunctival injection. No oro-pharyngeal erythema.  NECK: Supple. There is no jugular venous distention. No bruits, no lymphadenopathy, no thyromegaly.  HEART: Regular rate and rhythm,. No murmurs, no rubs, no clicks.  LUNGS: Crackles at the bases no accessory muscle usage ABDOMEN: Soft, flat, nontender, nondistended. Has good bowel sounds. No hepatosplenomegaly appreciated.  EXTREMITIES: No  evidence of any cyanosis, clubbing, or peripheral edema.  +2 pedal and radial pulses bilaterally.  NEUROLOGIC: The patient is alert, awake, and oriented x3 with no focal motor or sensory deficits appreciated bilaterally.  SKIN: Moist and warm with no rashes appreciated.  Psych: Not anxious, depressed LN: No inguinal LN enlargement    Antibiotics   Anti-infectives (From admission, onward)   None      Medications   Scheduled Meds: . amiodarone  200 mg Oral Daily  . apixaban  5 mg Oral BID  . aspirin  81 mg Oral Daily  . atorvastatin  80 mg Oral Daily  . brimonidine  1 drop Both Eyes TID  . cholecalciferol  1,000 Units Oral Daily  . furosemide  40 mg Intravenous Q12H  . latanoprost  1 drop Both Eyes QHS  . losartan  25 mg Oral Daily  . magnesium oxide  400 mg Oral BID  . metoprolol  200 mg Oral Daily  . potassium chloride  10 mEq Oral Daily  . sodium chloride flush  3 mL Intravenous Q12H  . tiotropium  18 mcg Inhalation Daily   Continuous Infusions: . sodium chloride     PRN Meds:.sodium chloride, acetaminophen, diphenhydrAMINE, nitroGLYCERIN, ondansetron (ZOFRAN) IV, sodium chloride flush   Data Review:   Micro Results Recent Results (from the past 240 hour(s))  SARS Coronavirus 2 (CEPHEID- Performed in [place] hospital  lab), Hosp Order     Status: None   Collection Time: 02/23/19  1:05 PM  Result Value Ref Range Status   SARS Coronavirus 2 NEGATIVE NEGATIVE Final    Comment: (NOTE) If result is NEGATIVE SARS-CoV-2 target nucleic acids are NOT DETECTED. The SARS-CoV-2 RNA is generally detectable in upper and lower  respiratory specimens during the acute phase of infection. The lowest  concentration of SARS-CoV-2 viral copies this assay can detect is 250  copies / mL. A negative result does not preclude SARS-CoV-2 infection  and should not be used as the sole basis for treatment or other  patient management decisions.  A negative result may occur with  improper specimen collection / handling, submission of specimen other  than nasopharyngeal swab, presence of viral mutation(s) within the  areas targeted by this assay, and inadequate number of viral copies  (<250 copies / mL). A negative result must be combined with clinical  observations, patient history, and epidemiological information. If result is POSITIVE SARS-CoV-2 target nucleic acids are DETECTED. The SARS-CoV-2 RNA is generally detectable in upper and lower  respiratory specimens dur ing the acute phase of infection.  Positive  results are indicative of active infection with SARS-CoV-2.  Clinical  correlation with patient history and other diagnostic information is  necessary to determine patient infection status.  Positive results do  not rule out bacterial infection or co-infection with other viruses. If result is PRESUMPTIVE POSTIVE SARS-CoV-2 nucleic acids MAY BE PRESENT.   A presumptive positive result was obtained on the submitted specimen  and confirmed on repeat testing.  While 2019 novel coronavirus  (SARS-CoV-2) nucleic acids may be present in the submitted sample  additional confirmatory testing may be necessary for epidemiological  and / or clinical management purposes  to differentiate between  SARS-CoV-2 and other Sarbecovirus  currently known to infect humans.  If clinically indicated additional testing with an alternate test  methodology [phone]) is advised. The SARS-CoV-2 RNA is generally  detectable in upper and lower respiratory sp ecimens during the acute  phase of infection. The expected  result is Negative. Fact Sheet for Patients:  StrictlyIdeas.no Fact Sheet for Healthcare Providers: BankingDealers.co.za This test is not yet approved or cleared by the [place] FDA and has been authorized for detection and/or diagnosis of SARS-CoV-2 by FDA under an Emergency Use Authorization (EUA).  This EUA will remain in effect (meaning this test can be used) for the duration of the COVID-19 declaration under Section 564(b)(1) of the Act, 21 U.S.C. section 360bbb-3(b)(1), unless the authorization is terminated or revoked sooner. Performed at [facility], [address] [place], [place] [address]   Blood culture (routine x 2)     Status: None (Preliminary result)   Collection Time: 02/23/19  1:05 PM  Result Value Ref Range Status   Specimen Description BLOOD LEFT ANTECUBITAL  Final   Special Requests   Final    BOTTLES DRAWN AEROBIC ONLY Blood Culture results may not be optimal due to an inadequate volume of blood received in culture bottles   Culture   Final    NO GROWTH < 24 HOURS Performed at [facility], [address], [place], [place] [address]    Report Status PENDING  Incomplete  Blood culture (routine x 2)     Status: None (Preliminary result)   Collection Time: 02/23/19  1:05 PM  Result Value Ref Range Status   Specimen Description BLOOD RIGHT ANTECUBITAL  Final   Special Requests   Final    BOTTLES DRAWN AEROBIC AND ANAEROBIC Blood Culture adequate volume   Culture   Final    NO GROWTH < 24 HOURS Performed at [facility], [address] [place], [place] [address]    Report Status PENDING  Incomplete     Radiology Reports Dg Chest Port 1 View  Result Date: 02/23/2019 CLINICAL DATA:  Shortness of breath EXAM: PORTABLE CHEST 1 VIEW COMPARISON:  Jan 28, 2019 and January 09, 2019 FINDINGS: There is interstitial edema throughout the lungs, essentially stable. There are small pleural effusions bilaterally. There is no frank airspace consolidation. There is cardiomegaly with pulmonary venous hypertension. Pacemaker leads are attached to the right atrium and right ventricle. There is aortic atherosclerosis. No adenopathy. No bone lesions. IMPRESSION: Pulmonary vascular congestion with small pleural effusions and interstitial edema. Appearance is felt to be indicative of a degree of congestive heart failure. There may well be superimposed noncardiogenic interstitial pneumonitis, although there is no frank airspace consolidation. Pacemaker leads attached to right atrium and right ventricle. Aortic Atherosclerosis (ICD10-I70.0). Electronically Signed   By: Lowella Grip III M.D.   On: 02/23/2019 13:25   Dg Chest Port 1 View  Result Date: 01/28/2019 CLINICAL DATA:  Cough and shortness of breath for 1 day EXAM: PORTABLE CHEST 1 VIEW COMPARISON:  Three days ago FINDINGS: Dual-chamber ICD/pacer leads from the left that are stable. Cardiomegaly. Transcatheter aortic valve replacement. Generalized interstitial coarsening. There is mild airspace opacity at the right upper lobe, unchanged. IMPRESSION: Cardiomegaly and vascular congestion without convincing change from prior. There may be superimposed airspace disease. Electronically Signed   By: Monte Fantasia M.D.   On: 01/28/2019 07:07   Dg Chest Portable 1 View  Result Date: 01/25/2019 CLINICAL DATA:  Dyspnea EXAM: PORTABLE CHEST 1 VIEW COMPARISON:  01/21/2019 chest radiograph. FINDINGS: Stable configuration of 2 lead left subclavian ICD. Stable cardiomediastinal silhouette with moderate cardiomegaly. Cardiac valvular prosthesis is in place. No pneumothorax. No  pleural effusion. Mild-to-moderate pulmonary edema, slightly improved. Stable mild left lung base patchy opacity. IMPRESSION: 1. Mild-to-moderate congestive heart  failure, slightly improved. 2. Stable mild patchy left lung base opacity, favor atelectasis. Electronically Signed   By: Ilona Sorrel M.D.   On: 01/25/2019 14:25     CBC Recent Labs  Lab 02/23/19 1305 02/24/19 0459  WBC 10.9* 10.6*  HGB 8.4* 8.1*  HCT 28.0* 27.3*  PLT 293 290  MCV 97.2 97.5  MCH 29.2 28.9  MCHC 30.0 29.7*  RDW 15.9* 15.9*    Chemistries  Recent Labs  Lab 02/23/19 1305 02/24/19 0459  NA 138 141  K 4.0 4.1  CL 101 102  CO2 28 29  GLUCOSE 161* 116*  BUN 44* 42*  CREATININE 1.79* 1.63*  CALCIUM 8.6* 8.7*  MG  --  2.4  AST 24  --   ALT 22  --   ALKPHOS 82  --   BILITOT 1.1  --    ------------------------------------------------------------------------------------------------------------------ estimated creatinine clearance is 47.2 mL/min (A) (by C-G formula based on SCr of 1.63 mg/dL (H)). ------------------------------------------------------------------------------------------------------------------ No results for input(s): HGBA1C in the last 72 hours. ------------------------------------------------------------------------------------------------------------------ No results for input(s): CHOL, HDL, LDLCALC, TRIG, CHOLHDL, LDLDIRECT in the last 72 hours. ------------------------------------------------------------------------------------------------------------------ No results for input(s): TSH, T4TOTAL, T3FREE, THYROIDAB in the last 72 hours.  Invalid input(s): FREET3 ------------------------------------------------------------------------------------------------------------------ No results for input(s): VITAMINB12, FOLATE, FERRITIN, TIBC, IRON, RETICCTPCT in the last 72 hours.  Coagulation profile No results for input(s): INR, PROTIME in the last 168 hours.  No results for input(s):  DDIMER in the last 72 hours.  Cardiac Enzymes Recent Labs  Lab 02/23/19 1305 02/23/19 1613 02/23/19 1942  TROPONINI 0.08* 0.08* 0.08*   ------------------------------------------------------------------------------------------------------------------ Invalid input(s): POCBNP    Assessment & Plan   Patient is a 73 year old male with history of chronic atrial fibrillation on anticoagulation with Eliquis, chronic kidney disease stage III, COPD, prior history of AICD, chronic diastolic CHF and prior history of asthma admitted with acute hypoxic respiratory failure secondary to CHF exacerbation.  1.  Acute hypoxic respiratory failure secondary to CHF exacerbation. Patient's breathing is improved Continue IV diuresis Continue oxygen therapy  2.  Acute on chronic diastolic CHF exacerbation.   Continue diuresis with IV Lasix.  So far no evidence of acute coronary syndrome. Patient had recent 2D echocardiogram in February 2020 with ejection fraction of 50 to 55%.  No indication to repeat at this time.  Started on losartan.  Continue Toprol-XL. Consulted cardiologist.  3.  Chronic atrial fibrillation Rate controlled.  Continue anticoagulation with Eliquis  4.  Chronic kidney disease stage III Monitor renal function closely with ongoing diuresis.  5.  Mildly elevated troponin of 0.08 Stable.  Most likely due to supply demand ischemia from CHF.      Code Status Orders  (From admission, onward)         Start     Ordered   02/23/19 1507  Full code  Continuous     02/23/19 1509        Code Status History    Date Active Date Inactive Code Status Order ID Comments User Context   01/25/2019 1855 01/30/2019 1905 Full Code [id number]  Otila Back, MD ED   12/30/2018 0224 01/21/2019 1931 Full Code [id number]  Lance Coon, MD Inpatient   11/03/2018 2032 11/12/2018 2045 Full Code [id number]  Gladstone Lighter, MD Inpatient   08/30/2018 1045 09/02/2018 1616 Full Code [id number]  Fritzi Mandes,  MD Inpatient   08/22/2018 1435 08/25/2018 2257 Full Code [id number]  Fritzi Mandes, MD Inpatient   01/08/2018 1523 01/11/2018 1712 Full Code [id number]  Nicholes Mango, MD ED   11/18/2017 2227 11/23/2017 2033 Full Code [id number]  Vaughan Basta, MD Inpatient   10/21/2016 1216 10/26/2016 2247 Full Code [id number]  Bettey Costa, MD Inpatient           Consults cardiology  DVT Prophylaxis Eliquis  Lab Results  Component Value Date   PLT 290 02/24/2019     Time Spent in minutes 35 minutes greater than 50% of time spent in care coordination and counseling patient regarding the condition and plan of care.   Dustin Flock M.D on 02/24/2019 at 2:12 PM  Between 7am to 6pm - Pager - [phone]  After 6pm go to [email or web address] - Proofreader  [employer]   Office  [phone]

## 2019-02-25 LAB — BASIC METABOLIC PANEL
Anion gap: 8 (ref 5–15)
BUN: 48 mg/dL — ABNORMAL HIGH (ref 8–23)
CO2: 30 mmol/L (ref 22–32)
Calcium: 8.8 mg/dL — ABNORMAL LOW (ref 8.9–10.3)
Chloride: 102 mmol/L (ref 98–111)
Creatinine, Ser: 1.65 mg/dL — ABNORMAL HIGH (ref 0.61–1.24)
GFR calc Af Amer: 47 mL/min — ABNORMAL LOW (ref >60)
GFR calc non Af Amer: 40 mL/min — ABNORMAL LOW (ref >60)
Glucose, Bld: 106 mg/dL — ABNORMAL HIGH (ref 70–99)
Potassium: 4.7 mmol/L (ref 3.5–5.1)
Sodium: 140 mmol/L (ref 135–145)

## 2019-02-25 LAB — CBC
HCT: 27 % — ABNORMAL LOW (ref 39.0–52.0)
Hemoglobin: 7.6 g/dL — ABNORMAL LOW (ref 13.0–17.0)
MCH: 28.6 pg (ref 26.0–34.0)
MCHC: 28.1 g/dL — ABNORMAL LOW (ref 30.0–36.0)
MCV: 101.5 fL — ABNORMAL HIGH (ref 80.0–100.0)
Platelets: 301 10*3/uL (ref 150–400)
RBC: 2.66 MIL/uL — ABNORMAL LOW (ref 4.22–5.81)
RDW: 16.3 % — ABNORMAL HIGH (ref 11.5–15.5)
WBC: 10.8 10*3/uL — ABNORMAL HIGH (ref 4.0–10.5)
nRBC: 0 % (ref 0.0–0.2)

## 2019-02-25 LAB — BRAIN NATRIURETIC PEPTIDE: B Natriuretic Peptide: 399 pg/mL — ABNORMAL HIGH (ref 0.0–100.0)

## 2019-02-25 MED ORDER — IPRATROPIUM-ALBUTEROL 0.5-2.5 (3) MG/3ML IN SOLN
3.0000 mL | RESPIRATORY_TRACT | Status: DC
  Administered 2019-02-25 – 2019-02-28 (×18): 3 mL via RESPIRATORY_TRACT
  Filled 2019-02-25 (×18): qty 3

## 2019-02-25 MED ORDER — BUDESONIDE 0.25 MG/2ML IN SUSP
0.2500 mg | Freq: Two times a day (BID) | RESPIRATORY_TRACT | Status: DC
  Administered 2019-02-25 – 2019-03-03 (×11): 0.25 mg via RESPIRATORY_TRACT
  Filled 2019-02-25 (×12): qty 2

## 2019-02-25 MED ORDER — BACITRACIN-NEOMYCIN-POLYMYXIN OINTMENT TUBE
TOPICAL_OINTMENT | Freq: Two times a day (BID) | CUTANEOUS | Status: DC
  Administered 2019-02-25: 1 via TOPICAL
  Administered 2019-02-25 – 2019-03-03 (×12): via TOPICAL
  Administered 2019-03-04: 1 via TOPICAL
  Administered 2019-03-04 – 2019-03-05 (×2): via TOPICAL
  Filled 2019-02-25 (×2): qty 14.17

## 2019-02-25 MED ORDER — MAGNESIUM HYDROXIDE 400 MG/5ML PO SUSP
30.0000 mL | Freq: Once | ORAL | Status: AC
  Administered 2019-02-25: 30 mL via ORAL
  Filled 2019-02-25: qty 30

## 2019-02-25 MED ORDER — METHYLPREDNISOLONE SODIUM SUCC 125 MG IJ SOLR
60.0000 mg | Freq: Four times a day (QID) | INTRAMUSCULAR | Status: DC
  Administered 2019-02-25 – 2019-03-01 (×16): 60 mg via INTRAVENOUS
  Filled 2019-02-25 (×16): qty 2

## 2019-02-25 MED ORDER — AMOXICILLIN-POT CLAVULANATE 875-125 MG PO TABS
1.0000 | ORAL_TABLET | Freq: Two times a day (BID) | ORAL | Status: DC
  Administered 2019-02-25 – 2019-02-27 (×5): 1 via ORAL
  Filled 2019-02-25 (×5): qty 1

## 2019-02-25 NOTE — Plan of Care (Signed)
  Problem: Clinical Measurements: Goal: Ability to maintain clinical measurements within normal limits will improve Outcome: Progressing Goal: Will remain free from infection Outcome: Progressing Goal: Respiratory complications will improve Outcome: Progressing   Problem: Activity: Goal: Risk for activity intolerance will decrease Outcome: Progressing   Problem: Elimination: Goal: Will not experience complications related to urinary retention Outcome: Progressing   Problem: Pain Managment: Goal: General experience of comfort will improve Outcome: Progressing   Problem: Skin Integrity: Goal: Risk for impaired skin integrity will decrease Outcome: Progressing   Problem: Education: Goal: Ability to demonstrate management of disease process will improve Outcome: Progressing

## 2019-02-25 NOTE — Progress Notes (Signed)
Increased to 6l after neb treatment and saturations increased to 91%. Patient states he is not SOB at this time.

## 2019-02-25 NOTE — Plan of Care (Addendum)
Nutrition Education Note  RD consulted for nutrition education regarding CHF.  73 y/o male with recurrent CHF  Spoke with pt via phone. Patient is well known to nutrition department and this RD; pt has received multiple CHF diet educations over multiple admits. Pt continues to be non-compliant with diet. RD reviewed today the importance of compliance with low sodium diet.  RD provided "Low Sodium Nutrition Therapy" handout from the [employer] via mail. Reviewed patient's dietary recall. Provided examples on ways to decrease sodium intake in diet. Discouraged intake of processed foods and use of salt shaker. Encouraged fresh fruits and vegetables as well as whole grain sources of carbohydrates to maximize fiber intake.   RD discussed why it is important for patient to adhere to diet recommendations, and emphasized the role of fluids, foods to avoid, and importance of weighing self daily. Teach back method used.  Expect poor compliance.  Body mass index is 30.04 kg/m. Pt meets criteria for obesity based on current BMI.  Current diet order is [place] patient is consuming approximately 100% of meals at this time. Labs and medications reviewed. No further nutrition interventions warranted at this time. RD contact information provided. If additional nutrition issues arise, please re-consult RD.   Koleen Distance MS, RD, LDN Pager #- [phone] Office#- [phone] After Hours Pager: [phone]

## 2019-02-25 NOTE — Consult Note (Signed)
Pulmonary Medicine          Date: 02/25/2019,   MRN# [id number] Darius Taylor 1946/03/03     AdmissionWeight: 97.5 kg                 CurrentWeight: 97.7 kg      CHIEF COMPLAINT:   Acute on chronic hypoxemic respiratory failure   HISTORY OF PRESENT ILLNESS   This is a pleasant 73 year old male with a history of aortic stenosis, atrial fibrillation, advanced COPD, bladder CA, CHF, colon CA, essential hypertension, who was hospitalized last month for acute systolic CHF exacerbation with difficulty diuresing and ultimately was diuresed over 10 pounds.  States he was at home using his 3 to 4 L/min O2 therapy and had progressively gotten more short of breath and restless.  Patient states that he had [facility] ordered with physical therapy who had come to work with him and noted significant desaturations.  His daughter and first cousin came to check on him and had prepared food for him however he was not feeling well.  Patient is currently on 6 L/min nasal cannula and states that he had slightly improved since coming to the hospital after receiving diuretic therapy.  He denies having flulike illness, chills, fevers, sick contacts, nausea vomiting diarrhea or constitutional symptoms   PAST MEDICAL HISTORY   Past Medical History:  Diagnosis Date  . Aortic stenosis   . Arrhythmia    atrial fibrillation  . Asthma   . Atrial fibrillation ([place])   . CAD (coronary artery disease)   . Cancer [facility])    colon, bladder and kidney  . CHF (congestive heart failure) [place]   . 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 COPD (chronic obstructive pulmonary disease) ([place])   . H/O ventricular tachycardia   . Hypertension   . Nonischemic cardiomyopathy ([place])    Status post AICD and pacemaker  . Renal cell carcinoma ([place])      SURGICAL HISTORY   Past Surgical History:  Procedure Laterality Date  . CARDIAC DEFIBRILLATOR PLACEMENT    . CARDIAC DEFIBRILLATOR PLACEMENT    . COLONOSCOPY WITH  PROPOFOL N/A 06/10/2018   Procedure: COLONOSCOPY WITH PROPOFOL;  Surgeon: Lin Landsman, MD;  Location: [facility] ENDOSCOPY;  Service: Gastroenterology;  Laterality: N/A;  . CORONARY ANGIOPLASTY  09/25/2016  . IRRIGATION AND DEBRIDEMENT HEMATOMA Left 10/24/2016   Procedure: IRRIGATION AND DEBRIDEMENT HEMATOMA;  Surgeon: Florene Glen, MD;  Location: [facility] ORS;  Service: General;  Laterality: Left;  . KIDNEY SURGERY     left ne[hrectomy for RCC  . PACEMAKER IMPLANT    . PARTIAL COLECTOMY       FAMILY HISTORY   Family History  Problem Relation Age of Onset  . Hypertension Mother   . Stroke Father      SOCIAL HISTORY   Social History   Tobacco Use  . Smoking status: Former Research scientist (life sciences)  . Smokeless tobacco: Never Used  Substance Use Topics  . Alcohol use: No  . Drug use: No     MEDICATIONS    Home Medication:    Current Medication:  Current Facility-Administered Medications:  .  0.9 %  sodium chloride infusion, 250 mL, Intravenous, PRN, Ojie, Jude, MD .  acetaminophen (TYLENOL) tablet 650 mg, 650 mg, Oral, Q4H PRN, Stark Jock, Jude, MD, 650 mg at 02/24/19 2585 .  amiodarone (PACERONE) tablet 200 mg, 200 mg, Oral, Daily, Ojie, Jude, MD, 200 mg at 02/25/19 0911 .  amoxicillin-clavulanate (AUGMENTIN) 875-125 MG  per tablet 1 tablet, 1 tablet, Oral, Q12H, Dustin Flock, MD, 1 tablet at 02/25/19 1242 .  apixaban (ELIQUIS) tablet 5 mg, 5 mg, Oral, BID, Ojie, Jude, MD, 5 mg at 02/25/19 0921 .  aspirin chewable tablet 81 mg, 81 mg, Oral, Daily, Ojie, Jude, MD, 81 mg at 02/25/19 0911 .  atorvastatin (LIPITOR) tablet 80 mg, 80 mg, Oral, Daily, Ojie, Jude, MD, 80 mg at 02/25/19 0909 .  brimonidine (ALPHAGAN) 0.15 % ophthalmic solution 1 drop, 1 drop, Both Eyes, TID, Ojie, Jude, MD, 1 drop at 02/25/19 1619 .  budesonide (PULMICORT) nebulizer solution 0.25 mg, 0.25 mg, Nebulization, BID, Dustin Flock, MD .  cholecalciferol (VITAMIN D) tablet 1,000 Units, 1,000 Units, Oral, Daily, Ojie,  Jude, MD, 1,000 Units at 02/25/19 0911 .  diphenhydrAMINE (BENADRYL) capsule 25 mg, 25 mg, Oral, QHS PRN, Lance Coon, MD, 25 mg at 02/24/19 2118 .  furosemide (LASIX) injection 40 mg, 40 mg, Intravenous, Q12H, Ojie, Jude, MD, 40 mg at 02/25/19 1356 .  ipratropium-albuterol (DUONEB) 0.5-2.5 (3) MG/3ML nebulizer solution 3 mL, 3 mL, Nebulization, Q4H, Dustin Flock, MD, 3 mL at 02/25/19 1501 .  latanoprost (XALATAN) 0.005 % ophthalmic solution 1 drop, 1 drop, Both Eyes, QHS, Ojie, Jude, MD, 1 drop at 02/24/19 2119 .  losartan (COZAAR) tablet 25 mg, 25 mg, Oral, Daily, Ojie, Jude, MD, 25 mg at 02/25/19 0910 .  magnesium oxide (MAG-OX) tablet 400 mg, 400 mg, Oral, BID, Ojie, Jude, MD, 400 mg at 02/25/19 0910 .  methylPREDNISolone sodium succinate (SOLU-MEDROL) 125 mg/2 mL injection 60 mg, 60 mg, Intravenous, Q6H, Dustin Flock, MD, 60 mg at 02/25/19 1750 .  metoprolol succinate (TOPROL-XL) 24 hr tablet 200 mg, 200 mg, Oral, Daily, Ojie, Jude, MD, 200 mg at 02/25/19 0910 .  neomycin-bacitracin-polymyxin (NEOSPORIN) ointment, , Topical, BID, Dustin Flock, MD, 1 application at 39/76/73 1245 .  nitroGLYCERIN (NITROSTAT) SL tablet 0.4 mg, 0.4 mg, Sublingual, Q5 min PRN, Ojie, Jude, MD .  ondansetron (ZOFRAN) injection 4 mg, 4 mg, Intravenous, Q6H PRN, Ojie, Jude, MD .  potassium chloride (K-DUR) CR tablet 10 mEq, 10 mEq, Oral, Daily, Ojie, Jude, MD, 10 mEq at 02/25/19 0910 .  sodium chloride flush (NS) 0.9 % injection 3 mL, 3 mL, Intravenous, Q12H, Ojie, Jude, MD, 3 mL at 02/25/19 0912 .  sodium chloride flush (NS) 0.9 % injection 3 mL, 3 mL, Intravenous, PRN, Ojie, Jude, MD .  tiotropium (SPIRIVA) inhalation capsule ([facility] use ONLY) 18 mcg, 18 mcg, Inhalation, Daily, Ojie, Jude, MD, 18 mcg at 02/25/19 0859    ALLERGIES   Patient has no known allergies.     REVIEW OF SYSTEMS    Review of Systems:  Gen:  Denies  fever, sweats, chills weigh loss  HEENT: Denies blurred vision, double  vision, ear pain, eye pain, hearing loss, nose bleeds, sore throat Cardiac:  No dizziness, chest pain or heaviness, chest tightness,edema Resp:   Positive shortness of breath Gi: Denies swallowing difficulty, stomach pain, nausea or vomiting, diarrhea, constipation, bowel incontinence Gu:  Denies bladder incontinence, burning urine Ext:   Denies Joint pain, stiffness or swelling Skin: Denies  skin rash, easy bruising or bleeding or hives Endoc:  Denies polyuria, polydipsia , polyphagia or weight change Psych:   Denies depression, insomnia or hallucinations   Other:  All other systems negative   VS: BP 123/89 (BP Location: Left Arm)   Pulse 63   Temp 98 F (36.7 C) (Oral)   Resp 18   Ht 5\' 11"  (  1.803 m)   Wt 97.7 kg   SpO2 91%   BMI 30.04 kg/m      PHYSICAL EXAM    GENERAL:NAD, no fevers, chills, no weakness no fatigue HEAD: Normocephalic, atraumatic.  EYES: Pupils equal, round, reactive to light. Extraocular muscles intact. No scleral icterus.  MOUTH: Moist mucosal membrane. Dentition intact. No abscess noted.  EAR, NOSE, THROAT: Clear without exudates. No external lesions.  NECK: Supple. No thyromegaly. No nodules. No JVD.  PULMONARY: Ankles at the bases bilaterally  cARDIOVASCULAR: S1 and S2. Regular rate and rhythm. No murmurs, rubs, or gallops. No edema. Pedal pulses 2+ bilaterally.  GASTROINTESTINAL: Soft, nontender, nondistended. No masses. Positive bowel sounds. No hepatosplenomegaly.  MUSCULOSKELETAL: No swelling, clubbing, or edema. Range of motion full in all extremities.  NEUROLOGIC: Cranial nerves II through XII are intact. No gross focal neurological deficits. Sensation intact. Reflexes intact.  SKIN: No ulceration, lesions, rashes, or cyanosis. Skin warm and dry. Turgor intact.  PSYCHIATRIC: Mood, affect within normal limits. The patient is awake, alert and oriented x 3. Insight, judgment intact.       IMAGING    Dg Chest Port 1 View  Result Date:  02/23/2019 CLINICAL DATA:  Shortness of breath EXAM: PORTABLE CHEST 1 VIEW COMPARISON:  Jan 28, 2019 and January 09, 2019 FINDINGS: There is interstitial edema throughout the lungs, essentially stable. There are small pleural effusions bilaterally. There is no frank airspace consolidation. There is cardiomegaly with pulmonary venous hypertension. Pacemaker leads are attached to the right atrium and right ventricle. There is aortic atherosclerosis. No adenopathy. No bone lesions. IMPRESSION: Pulmonary vascular congestion with small pleural effusions and interstitial edema. Appearance is felt to be indicative of a degree of congestive heart failure. There may well be superimposed noncardiogenic interstitial pneumonitis, although there is no frank airspace consolidation. Pacemaker leads attached to right atrium and right ventricle. Aortic Atherosclerosis (ICD10-I70.0). Electronically Signed   By: Lowella Grip III M.D.   On: 02/23/2019 13:25   Dg Chest Port 1 View  Result Date: 01/28/2019 CLINICAL DATA:  Cough and shortness of breath for 1 day EXAM: PORTABLE CHEST 1 VIEW COMPARISON:  Three days ago FINDINGS: Dual-chamber ICD/pacer leads from the left that are stable. Cardiomegaly. Transcatheter aortic valve replacement. Generalized interstitial coarsening. There is mild airspace opacity at the right upper lobe, unchanged. IMPRESSION: Cardiomegaly and vascular congestion without convincing change from prior. There may be superimposed airspace disease. Electronically Signed   By: Monte Fantasia M.D.   On: 01/28/2019 07:07      ASSESSMENT/PLAN    Acute on chronic hypoxemic respiratory failure due to Acute on chronic systolic and diastolic decompensated CHF with EF 50 to 63% complicated by advanced COPD with chronic hypoxemia, severe anemia and possible pulmonary hypertension suggested by severely elevated RVSP most recent echo -Agree with diuresis -Cardiology on case appreciate input -Strict I's and O's  -Fluid intake restirction 1200cc   Advanced COPD -continue COPD carepath - DUonebs,  -IS at bedsie -Acapella    Thank you for allowing me to participate in the care of this patient.   Patient/Family are satisfied with care plan and all questions have been answered.  This document was prepared using Dragon voice recognition software and may include unintentional dictation errors.     Ottie Glazier, M.D.  Division of [place]

## 2019-02-25 NOTE — Progress Notes (Signed)
[facility] Cardiology  SUBJECTIVE: The patient reports that he is feeling somewhat better this morning with some improvement in his breathing. He denies chest pain.   Vitals:   02/24/19 2123 02/25/19 0447 02/25/19 0507 02/25/19 0812  BP:  (!) 132/41 118/79 123/89  Pulse:  64 62 63  Resp:  19  18  Temp:  98.1 F (36.7 C)  98 F (36.7 C)  TempSrc:  Oral  Oral  SpO2: 93% 91% 93% 94%  Weight:   97.7 kg   Height:         Intake/Output Summary (Last 24 hours) at 02/25/2019 1017 Last data filed at 02/25/2019 0912 Gross per 24 hour  Intake 483 ml  Output 1885 ml  Net -1402 ml      PHYSICAL EXAM  General: Well developed, well nourished, in no acute distress, lying at minimal incline in bed  HEENT:  Normocephalic and atramatic Neck:  No JVD.  Lungs: bibasilar crackles, wheezing, normal effort of breathing on [place] Heart: HRRR . Normal S1 and S2 without gallops or murmurs.  Abdomen: nondistended Msk:  Back normal, gait not assessed. Normal strength and tone for age. Extremities: 1+ bilateral lower extremity edema.   Neuro: Alert and oriented X 3.  Psych:  Good affect, responds appropriately Skin: left inner thigh with erythematous, raised, nondraining rash  LABS: Basic Metabolic Panel: Recent Labs    02/24/19 0459 02/25/19 0225  NA 141 140  K 4.1 4.7  CL 102 102  CO2 29 30  GLUCOSE 116* 106*  BUN 42* 48*  CREATININE 1.63* 1.65*  CALCIUM 8.7* 8.8*  MG 2.4  --    Liver Function Tests: Recent Labs    02/23/19 1305  AST 24  ALT 22  ALKPHOS 82  BILITOT 1.1  PROT 7.9  ALBUMIN 3.3*   No results for input(s): LIPASE, AMYLASE in the last 72 hours. CBC: Recent Labs    02/23/19 1305 02/24/19 0459  WBC 10.9* 10.6*  HGB 8.4* 8.1*  HCT 28.0* 27.3*  MCV 97.2 97.5  PLT 293 290   Cardiac Enzymes: Recent Labs    02/23/19 1305 02/23/19 1613 02/23/19 1942  TROPONINI 0.08* 0.08* 0.08*   BNP: Invalid input(s): POCBNP D-Dimer: No results for input(s): DDIMER in the last  72 hours. Hemoglobin A1C: No results for input(s): HGBA1C in the last 72 hours. Fasting Lipid Panel: No results for input(s): CHOL, HDL, LDLCALC, TRIG, CHOLHDL, LDLDIRECT in the last 72 hours. Thyroid Function Tests: No results for input(s): TSH, T4TOTAL, T3FREE, THYROIDAB in the last 72 hours.  Invalid input(s): FREET3 Anemia Panel: No results for input(s): VITAMINB12, FOLATE, FERRITIN, TIBC, IRON, RETICCTPCT in the last 72 hours.  Dg Chest Port 1 View  Result Date: 02/23/2019 CLINICAL DATA:  Shortness of breath EXAM: PORTABLE CHEST 1 VIEW COMPARISON:  Jan 28, 2019 and January 09, 2019 FINDINGS: There is interstitial edema throughout the lungs, essentially stable. There are small pleural effusions bilaterally. There is no frank airspace consolidation. There is cardiomegaly with pulmonary venous hypertension. Pacemaker leads are attached to the right atrium and right ventricle. There is aortic atherosclerosis. No adenopathy. No bone lesions. IMPRESSION: Pulmonary vascular congestion with small pleural effusions and interstitial edema. Appearance is felt to be indicative of a degree of congestive heart failure. There may well be superimposed noncardiogenic interstitial pneumonitis, although there is no frank airspace consolidation. Pacemaker leads attached to right atrium and right ventricle. Aortic Atherosclerosis (ICD10-I70.0). Electronically Signed   By: Lowella Grip III M.D.  On: 02/23/2019 13:25     Echo EF 50-55%  TELEMETRY: paced rhythm, 66 bpm  ASSESSMENT AND PLAN:  Active Problems:   Acute on chronic diastolic CHF (congestive heart failure) ([place])    1. Acute on chronic hypoxic respiratory failure due to underlying acute on chronic diastolic CHF with history of COPD. On IV Lasix and supplemental oxygen. 2D echocardiogram in February 2020 with ejection fraction of 50 to 55%. Had appointment scheduled for today for HF clinic.  2. COPD with chronic oxygen use, sounds tight this  morning with some wheezing 3. Chronic atrial fibrillation, rate and rhythm controlled with amiodarone and metoprolol. On Eliquis. 4. Coronary artery disease, status post PCI with BMS x2 proximal LAD in 01/2012, currently without chest pain, with troponin borderline elevated to 0.08 x 2, likely secondary to demand supply ischemia in the setting of acute on chronic CHF 5. Anemia, hemoglobin 8.1, hematocrit 27.3. During admission 01/2019, patient's hemoglobin was 7.4; he received 1 dose of IV Venofer, and I unit of PRBC. Otelia Sergeant studies suggested iron deficiency with no acute bleeding.  6. Aortic stenosis, status post TAVR in 2018, recent echo stable 7. Nonischemic dilated cardiomyopathy and history of ventricular tachycardia, status post AICD in 2006. ICD interrogated 10/2018 8. CKD stage III  Recommendations: 1. Continue IV Lasix with careful monitoring of renal status 2. Continue metoprolol, losartan, and atorvastatin 3. Continue Eliquis for stroke prevention, and amiodarone for rhythm control 4. Recommend nebulizer treatment, possible pulmonary consult 5. Monitor anemia closely, transfuse if needed. CBC ordered. 6. Informed Dr. Posey Pronto of patient's skin rash on inner thigh  Clabe Seal, PA-C 02/25/2019 10:17 AM

## 2019-02-25 NOTE — Progress Notes (Signed)
[name] [facility]                                                                                                                                                                                  Patient Demographics   Darius Taylor, is a 73 y.o. male, DOB - 07/11/1946, XAJ:[id number]  Admit date - 02/23/2019   Admitting Physician Jude Stark Jock, MD  Outpatient Primary MD for the patient is [facility]   LOS - 2  Subjective: Patient having significant wheezing and shortness of breath   Review of Systems:   CONSTITUTIONAL: No documented fever. No fatigue, weakness. No weight gain, no weight loss.  EYES: No blurry or double vision.  ENT: No tinnitus. No postnasal drip. No redness of the oropharynx.  RESPIRATORY: No cough, positive wheeze, no hemoptysis.  Positive dyspnea.  CARDIOVASCULAR: No chest pain. No orthopnea. No palpitations. No syncope.  GASTROINTESTINAL: No nausea, no vomiting or diarrhea. No abdominal pain. No melena or hematochezia.  GENITOURINARY: No dysuria or hematuria.  ENDOCRINE: No polyuria or nocturia. No heat or cold intolerance.  HEMATOLOGY: No anemia. No bruising. No bleeding.  INTEGUMENTARY: No rashes. No lesions.  MUSCULOSKELETAL: No arthritis. No swelling. No gout.  NEUROLOGIC: No numbness, tingling, or ataxia. No seizure-type activity.  PSYCHIATRIC: No anxiety. No insomnia. No ADD.    Vitals:   Vitals:   02/25/19 0507 02/25/19 0812 02/25/19 1501 02/25/19 1506  BP: 118/79 123/89    Pulse: 62 63    Resp:  18    Temp:  98 F (36.7 C)    TempSrc:  Oral    SpO2: 93% 94% (!) 89% 91%  Weight: 97.7 kg     Height:        Wt Readings from Last 3 Encounters:  02/25/19 97.7 kg  02/05/19 96.2 kg  02/04/19 96.3 kg     Intake/Output Summary (Last 24 hours) at 02/25/2019 1520 Last data filed at 02/25/2019 1200 Gross per 24 hour  Intake 483 ml  Output 1865 ml  Net -1382 ml    Physical Exam:   GENERAL:  Pleasant-appearing in no apparent distress.  HEAD, EYES, EARS, NOSE AND THROAT: Atraumatic, normocephalic. Extraocular muscles are intact. Pupils equal and reactive to light. Sclerae anicteric. No conjunctival injection. No oro-pharyngeal erythema.  NECK: Supple. There is no jugular venous distention. No bruits, no lymphadenopathy, no thyromegaly.  HEART: Regular rate and rhythm,. No murmurs, no rubs, no clicks.  LUNGS: Bilateral wheezing throughout both lung with accessory muscle usage ABDOMEN: Soft, flat, nontender, nondistended. Has good bowel sounds. No hepatosplenomegaly appreciated.  EXTREMITIES: No evidence of any cyanosis, clubbing, or peripheral edema.  +  2 pedal and radial pulses bilaterally.  NEUROLOGIC: The patient is alert, awake, and oriented x3 with no focal motor or sensory deficits appreciated bilaterally.  SKIN: Moist and warm with no rashes appreciated.  Psych: Not anxious, depressed LN: No inguinal LN enlargement    Antibiotics   Anti-infectives (From admission, onward)   Start     Dose/Rate Route Frequency Ordered Stop   02/25/19 1200  amoxicillin-clavulanate (AUGMENTIN) 875-125 MG per tablet 1 tablet     1 tablet Oral Every 12 hours 02/25/19 1159        Medications   Scheduled Meds: . amiodarone  200 mg Oral Daily  . amoxicillin-clavulanate  1 tablet Oral Q12H  . apixaban  5 mg Oral BID  . aspirin  81 mg Oral Daily  . atorvastatin  80 mg Oral Daily  . brimonidine  1 drop Both Eyes TID  . budesonide (PULMICORT) nebulizer solution  0.25 mg Nebulization BID  . cholecalciferol  1,000 Units Oral Daily  . furosemide  40 mg Intravenous Q12H  . ipratropium-albuterol  3 mL Nebulization Q4H  . latanoprost  1 drop Both Eyes QHS  . losartan  25 mg Oral Daily  . magnesium oxide  400 mg Oral BID  . methylPREDNISolone (SOLU-MEDROL) injection  60 mg Intravenous Q6H  . metoprolol  200 mg Oral Daily  . neomycin-bacitracin-polymyxin   Topical BID  . potassium chloride  10  mEq Oral Daily  . sodium chloride flush  3 mL Intravenous Q12H  . tiotropium  18 mcg Inhalation Daily   Continuous Infusions: . sodium chloride     PRN Meds:.sodium chloride, acetaminophen, diphenhydrAMINE, nitroGLYCERIN, ondansetron (ZOFRAN) IV, sodium chloride flush   Data Review:   Micro Results Recent Results (from the past 240 hour(s))  SARS Coronavirus 2 (CEPHEID- Performed in [place] hospital lab), Hosp Order     Status: None   Collection Time: 02/23/19  1:05 PM  Result Value Ref Range Status   SARS Coronavirus 2 NEGATIVE NEGATIVE Final    Comment: (NOTE) If result is NEGATIVE SARS-CoV-2 target nucleic acids are NOT DETECTED. The SARS-CoV-2 RNA is generally detectable in upper and lower  respiratory specimens during the acute phase of infection. The lowest  concentration of SARS-CoV-2 viral copies this assay can detect is 250  copies / mL. A negative result does not preclude SARS-CoV-2 infection  and should not be used as the sole basis for treatment or other  patient management decisions.  A negative result may occur with  improper specimen collection / handling, submission of specimen other  than nasopharyngeal swab, presence of viral mutation(s) within the  areas targeted by this assay, and inadequate number of viral copies  (<250 copies / mL). A negative result must be combined with clinical  observations, patient history, and epidemiological information. If result is POSITIVE SARS-CoV-2 target nucleic acids are DETECTED. The SARS-CoV-2 RNA is generally detectable in upper and lower  respiratory specimens dur ing the acute phase of infection.  Positive  results are indicative of active infection with SARS-CoV-2.  Clinical  correlation with patient history and other diagnostic information is  necessary to determine patient infection status.  Positive results do  not rule out bacterial infection or co-infection with other viruses. If result is PRESUMPTIVE  POSTIVE SARS-CoV-2 nucleic acids MAY BE PRESENT.   A presumptive positive result was obtained on the submitted specimen  and confirmed on repeat testing.  While 2019 novel coronavirus  (SARS-CoV-2) nucleic acids may be present in  the submitted sample  additional confirmatory testing may be necessary for epidemiological  and / or clinical management purposes  to differentiate between  SARS-CoV-2 and other Sarbecovirus currently known to infect humans.  If clinically indicated additional testing with an alternate test  methodology [phone]) is advised. The SARS-CoV-2 RNA is generally  detectable in upper and lower respiratory sp ecimens during the acute  phase of infection. The expected result is Negative. Fact Sheet for Patients:  StrictlyIdeas.no Fact Sheet for Healthcare Providers: BankingDealers.co.za This test is not yet approved or cleared by the [place] FDA and has been authorized for detection and/or diagnosis of SARS-CoV-2 by FDA under an Emergency Use Authorization (EUA).  This EUA will remain in effect (meaning this test can be used) for the duration of the COVID-19 declaration under Section 564(b)(1) of the Act, 21 U.S.C. section 360bbb-3(b)(1), unless the authorization is terminated or revoked sooner. Performed at [facility], [address] [place], [place] [address]   Blood culture (routine x 2)     Status: None (Preliminary result)   Collection Time: 02/23/19  1:05 PM  Result Value Ref Range Status   Specimen Description BLOOD LEFT ANTECUBITAL  Final   Special Requests   Final    BOTTLES DRAWN AEROBIC ONLY Blood Culture results may not be optimal due to an inadequate volume of blood received in culture bottles   Culture   Final    NO GROWTH 2 DAYS Performed at [facility], [address], [place], [place] [address]    Report Status PENDING  Incomplete  Blood culture (routine x 2)     Status:  None (Preliminary result)   Collection Time: 02/23/19  1:05 PM  Result Value Ref Range Status   Specimen Description BLOOD RIGHT ANTECUBITAL  Final   Special Requests   Final    BOTTLES DRAWN AEROBIC AND ANAEROBIC Blood Culture adequate volume   Culture   Final    NO GROWTH 2 DAYS Performed at [facility], [address], [place], [place] [address]    Report Status PENDING  Incomplete    Radiology Reports Dg Chest Port 1 View  Result Date: 02/23/2019 CLINICAL DATA:  Shortness of breath EXAM: PORTABLE CHEST 1 VIEW COMPARISON:  Jan 28, 2019 and January 09, 2019 FINDINGS: There is interstitial edema throughout the lungs, essentially stable. There are small pleural effusions bilaterally. There is no frank airspace consolidation. There is cardiomegaly with pulmonary venous hypertension. Pacemaker leads are attached to the right atrium and right ventricle. There is aortic atherosclerosis. No adenopathy. No bone lesions. IMPRESSION: Pulmonary vascular congestion with small pleural effusions and interstitial edema. Appearance is felt to be indicative of a degree of congestive heart failure. There may well be superimposed noncardiogenic interstitial pneumonitis, although there is no frank airspace consolidation. Pacemaker leads attached to right atrium and right ventricle. Aortic Atherosclerosis (ICD10-I70.0). Electronically Signed   By: Lowella Grip III M.D.   On: 02/23/2019 13:25   Dg Chest Port 1 View  Result Date: 01/28/2019 CLINICAL DATA:  Cough and shortness of breath for 1 day EXAM: PORTABLE CHEST 1 VIEW COMPARISON:  Three days ago FINDINGS: Dual-chamber ICD/pacer leads from the left that are stable. Cardiomegaly. Transcatheter aortic valve replacement. Generalized interstitial coarsening. There is mild airspace opacity at the right upper lobe, unchanged. IMPRESSION: Cardiomegaly and vascular congestion without convincing change from prior. There may be superimposed airspace disease.  Electronically Signed   By: Monte Fantasia M.D.   On: 01/28/2019  07:07     CBC Recent Labs  Lab 02/23/19 1305 02/24/19 0459 02/25/19 0225  WBC 10.9* 10.6* 10.8*  HGB 8.4* 8.1* 7.6*  HCT 28.0* 27.3* 27.0*  PLT 293 290 301  MCV 97.2 97.5 101.5*  MCH 29.2 28.9 28.6  MCHC 30.0 29.7* 28.1*  RDW 15.9* 15.9* 16.3*    Chemistries  Recent Labs  Lab 02/23/19 1305 02/24/19 0459 02/25/19 0225  NA 138 141 140  K 4.0 4.1 4.7  CL 101 102 102  CO2 28 29 30   GLUCOSE 161* 116* 106*  BUN 44* 42* 48*  CREATININE 1.79* 1.63* 1.65*  CALCIUM 8.6* 8.7* 8.8*  MG  --  2.4  --   AST 24  --   --   ALT 22  --   --   ALKPHOS 82  --   --   BILITOT 1.1  --   --    ------------------------------------------------------------------------------------------------------------------ estimated creatinine clearance is 46.8 mL/min (A) (by C-G formula based on SCr of 1.65 mg/dL (H)). ------------------------------------------------------------------------------------------------------------------ No results for input(s): HGBA1C in the last 72 hours. ------------------------------------------------------------------------------------------------------------------ No results for input(s): CHOL, HDL, LDLCALC, TRIG, CHOLHDL, LDLDIRECT in the last 72 hours. ------------------------------------------------------------------------------------------------------------------ No results for input(s): TSH, T4TOTAL, T3FREE, THYROIDAB in the last 72 hours.  Invalid input(s): FREET3 ------------------------------------------------------------------------------------------------------------------ No results for input(s): VITAMINB12, FOLATE, FERRITIN, TIBC, IRON, RETICCTPCT in the last 72 hours.  Coagulation profile No results for input(s): INR, PROTIME in the last 168 hours.  No results for input(s): DDIMER in the last 72 hours.  Cardiac Enzymes Recent Labs  Lab 02/23/19 1305 02/23/19 1613 02/23/19 1942   TROPONINI 0.08* 0.08* 0.08*   ------------------------------------------------------------------------------------------------------------------ Invalid input(s): POCBNP    Assessment & Plan   Patient is a 73 year old male with history of chronic atrial fibrillation on anticoagulation with Eliquis, chronic kidney disease stage III, COPD, prior history of AICD, chronic diastolic CHF and prior history of asthma admitted with acute hypoxic respiratory failure secondary to CHF exacerbation.  1.  Acute hypoxic respiratory failure secondary to CHF exacerbation. Continue IV diuresis Patient now has acute bronchospasm treat with nebs steroids I have asked pulmonary to see him  2.  Acute on chronic diastolic CHF exacerbation.   Continue diuresis with IV Lasix.  So far no evidence of acute coronary syndrome. Patient had recent 2D echocardiogram in February 2020 with ejection fraction of 50 to 55%.  No indication to repeat at this time.    Continue on losartan.  Continue Toprol-XL. Cardiology has seen the patient  3.  Chronic atrial fibrillation Rate controlled.  Continue anticoagulation with Eliquis  4.  Chronic kidney disease stage III Monitor renal function closely with ongoing diuresis.  5.  Mildly elevated troponin of 0.08 Stable.  Most likely due to supply demand ischemia from CHF.      Code Status Orders  (From admission, onward)         Start     Ordered   02/23/19 1507  Full code  Continuous     02/23/19 1509        Code Status History    Date Active Date Inactive Code Status Order ID Comments User Context   01/25/2019 1855 01/30/2019 1905 Full Code [id number]  Otila Back, MD ED   12/30/2018 0224 01/21/2019 1931 Full Code [id number]  Lance Coon, MD Inpatient   11/03/2018 2032 11/12/2018 2045 Full Code [id number]  Gladstone Lighter, MD Inpatient   08/30/2018 1045 09/02/2018 1616 Full Code [id number]  Fritzi Mandes, MD Inpatient  08/22/2018 1435 08/25/2018 2257 Full Code  [id number]  Fritzi Mandes, MD Inpatient   01/08/2018 1523 01/11/2018 1712 Full Code [id number]  Nicholes Mango, MD ED   11/18/2017 2227 11/23/2017 2033 Full Code [id number]  Vaughan Basta, MD Inpatient   10/21/2016 1216 10/26/2016 2247 Full Code [id number]  Bettey Costa, MD Inpatient           Consults cardiology  DVT Prophylaxis Eliquis  Lab Results  Component Value Date   PLT 301 02/25/2019     Time Spent in minutes 35 minutes greater than 50% of time spent in care coordination and counseling patient regarding the condition and plan of care.   Dustin Flock M.D on 02/25/2019 at 3:20 PM  Between 7am to 6pm - Pager - [phone]  After 6pm go to [email or web address] - Proofreader  [employer]   Office  [phone]

## 2019-02-26 LAB — VITAMIN B12: Vitamin B-12: 185 pg/mL (ref 180–914)

## 2019-02-26 LAB — RETICULOCYTES
Immature Retic Fract: 26.9 % — ABNORMAL HIGH (ref 2.3–15.9)
RBC.: 2.8 MIL/uL — ABNORMAL LOW (ref 4.22–5.81)
Retic Count, Absolute: 132.4 10*3/uL (ref 19.0–186.0)
Retic Ct Pct: 4.7 % — ABNORMAL HIGH (ref 0.4–3.1)

## 2019-02-26 LAB — BASIC METABOLIC PANEL
Anion gap: 11 (ref 5–15)
BUN: 62 mg/dL — ABNORMAL HIGH (ref 8–23)
CO2: 28 mmol/L (ref 22–32)
Calcium: 8.8 mg/dL — ABNORMAL LOW (ref 8.9–10.3)
Chloride: 100 mmol/L (ref 98–111)
Creatinine, Ser: 1.92 mg/dL — ABNORMAL HIGH (ref 0.61–1.24)
GFR calc Af Amer: 39 mL/min — ABNORMAL LOW (ref >60)
GFR calc non Af Amer: 34 mL/min — ABNORMAL LOW (ref >60)
Glucose, Bld: 191 mg/dL — ABNORMAL HIGH (ref 70–99)
Potassium: 4.4 mmol/L (ref 3.5–5.1)
Sodium: 139 mmol/L (ref 135–145)

## 2019-02-26 LAB — IRON AND TIBC
Iron: 39 ug/dL — ABNORMAL LOW (ref 45–182)
Saturation Ratios: 10 % — ABNORMAL LOW (ref 17.9–39.5)
TIBC: 395 ug/dL (ref 250–450)
UIBC: 356 ug/dL

## 2019-02-26 LAB — FERRITIN: Ferritin: 120 ng/mL (ref 24–336)

## 2019-02-26 LAB — FOLATE: Folate: 11.7 ng/mL (ref >5.9)

## 2019-02-26 MED ORDER — FUROSEMIDE 40 MG PO TABS
40.0000 mg | ORAL_TABLET | Freq: Two times a day (BID) | ORAL | Status: DC
  Administered 2019-02-26: 40 mg via ORAL
  Filled 2019-02-26: qty 1

## 2019-02-26 MED ORDER — GUAIFENESIN ER 600 MG PO TB12
600.0000 mg | ORAL_TABLET | Freq: Two times a day (BID) | ORAL | Status: DC
  Administered 2019-02-26 – 2019-03-05 (×15): 600 mg via ORAL
  Filled 2019-02-26 (×15): qty 1

## 2019-02-26 NOTE — Progress Notes (Signed)
[facility]  SUBJECTIVE: The patient reports feeling somewhat better this morning, breathing better, no chest pain. States that he has an intermittent productive cough this morning.   Vitals:   02/26/19 0326 02/26/19 0421 02/26/19 0805 02/26/19 0819  BP: 123/77   103/82  Pulse: 63   67  Resp: 18   19  Temp: 98.3 F (36.8 C)   97.7 F (36.5 C)  TempSrc:    Oral  SpO2: 92% 93% 94% 93%  Weight:  97.3 kg    Height:         Intake/Output Summary (Last 24 hours) at 02/26/2019 0934 Last data filed at 02/26/2019 0318 Gross per 24 hour  Intake 723 ml  Output 530 ml  Net 193 ml      PHYSICAL EXAM  General: Well developed, well nourished, in no acute distress, sitting on side of bed eating breakfast HEENT:  Normocephalic and atramatic Neck:  No JVD.  Lungs: Clear bilaterally to auscultation, normal effort of breathing on supplemental oxygen Heart: HRRR . Normal S1 and S2 without gallops or murmurs.  Abdomen: nondistended Msk:  Back normal, gait not assessed. Normal strength and tone for age. Extremities: No clubbing, cyanosis or edema.   Neuro: Alert and oriented X 3. Psych:  Good affect, responds appropriately   LABS: Basic Metabolic Panel: Recent Labs    02/24/19 0459 02/25/19 0225 02/26/19 0632  NA 141 140 139  K 4.1 4.7 4.4  CL 102 102 100  CO2 29 30 28   GLUCOSE 116* 106* 191*  BUN 42* 48* 62*  CREATININE 1.63* 1.65* 1.92*  CALCIUM 8.7* 8.8* 8.8*  MG 2.4  --   --    Liver Function Tests: Recent Labs    02/23/19 1305  AST 24  ALT 22  ALKPHOS 82  BILITOT 1.1  PROT 7.9  ALBUMIN 3.3*   No results for input(s): LIPASE, AMYLASE in the last 72 hours. CBC: Recent Labs    02/24/19 0459 02/25/19 0225  WBC 10.6* 10.8*  HGB 8.1* 7.6*  HCT 27.3* 27.0*  MCV 97.5 101.5*  PLT 290 301   Cardiac Enzymes: Recent Labs    02/23/19 1305 02/23/19 1613 02/23/19 1942  TROPONINI 0.08* 0.08* 0.08*   BNP: Invalid input(s): POCBNP D-Dimer: No results for  input(s): DDIMER in the last 72 hours. Hemoglobin A1C: No results for input(s): HGBA1C in the last 72 hours. Fasting Lipid Panel: No results for input(s): CHOL, HDL, LDLCALC, TRIG, CHOLHDL, LDLDIRECT in the last 72 hours. Thyroid Function Tests: No results for input(s): TSH, T4TOTAL, T3FREE, THYROIDAB in the last 72 hours.  Invalid input(s): FREET3 Anemia Panel: No results for input(s): VITAMINB12, FOLATE, FERRITIN, TIBC, IRON, RETICCTPCT in the last 72 hours.  No results found.   Echo EF 50-55%  TELEMETRY: paced rhythm, 60s  ASSESSMENT AND PLAN:  Active Problems:   Acute on chronic diastolic CHF (congestive heart failure) ([place])    1. Acute on chronic hypoxic respiratory failure due to underlying acute on chronic diastolic CHF with history of COPD and marked anemia. On IV Lasix and supplemental oxygen. 2D echocardiogram in February 2020 with ejection fraction of 50 to 55%. BNP down to 399 from 686. 2. COPD with chronic oxygen use at home 3. Chronic atrial fibrillation, rate and rhythm controlled with amiodarone and metoprolol. On Eliquis. 4. Coronary artery disease, status postPCI with BMS x2 proximal LAD in 01/2012,currently without chest pain, with troponin borderline elevated to 0.08 x 2, likely secondary to demand supply ischemia in  the setting of acute on chronic CHF 5. Anemia, hemoglobin 7.6, hematocrit 27.0. During admission 01/2019, patient's hemoglobin was 7.4; he received 1 dose of IV Venofer, and I unit of PRBC. Otelia Sergeant studies suggested iron deficiency with no acute bleeding.  6. Chronic kidney disease stage III; creatinine up to 1.9 today  Recommendations: 1. Continue Eliquis for stroke prevention 2. Continue amiodarone 200 mg once daily 3. Continue treatments for COPD 4. Recommend decreasing dose of IV Lasix to once daily at this due to worsening kidney function 5. Continue losartan and metoprolol succinate 6. Consider IV Venofer or PRBC   Clabe Seal,  PA-C 02/26/2019 9:34 AM

## 2019-02-26 NOTE — Care Management Important Message (Signed)
Important Message  Patient Details  Name: Darius Taylor MRN: [id number] Date of Birth: November 15, 1945   Medicare Important Message Given:  Yes    Dannette Barbara 02/26/2019, 2:29 PM

## 2019-02-26 NOTE — Plan of Care (Signed)
Weaned to 3L, o2 sats >90%, will continue to monitor.   Problem: Elimination: Goal: Will not experience complications related to bowel motility Outcome: Progressing   Problem: Safety: Goal: Ability to remain free from injury will improve Outcome: Progressing   Problem: Cardiac: Goal: Ability to achieve and maintain adequate cardiopulmonary perfusion will improve Outcome: Progressing

## 2019-02-26 NOTE — TOC Initial Note (Signed)
Transition of Care [facility]) - Initial/Assessment Note    Patient Details  Name: Darius Taylor MRN: [id number] Date of Birth: [birth year]  Transition of Care [facility]) CM/SW Contact:    Elza Rafter, RN Phone Number: 02/26/2019, 2:49 PM  Clinical Narrative:    Patient is from home alone.  His daughter lives close by and transports to appointments.  Current with PCP; obtains medications at [facility] without difficulty.  He is on 3L chronic oxygen.  He requesting a small portable O2 tank-notified Brad with [employer].  Last time he was admitted Brad gave him longer tubing.  He states this is not helping and that he needs the portable tank for travel and sitting on his porch.  He states the current concentrator is too heavy.  He has a functioning scale and weighs daily.  Frequent admission.  OPen to [facility] with [facility] protocol.  Helene Kelp is aware of admission; will notify her at DC.                 Expected Discharge Plan: [place] Barriers to Discharge: Continued Medical Work up   Patient Goals and CMS Choice Patient states their goals for this hospitalization and ongoing recovery are:: needs portable O2-[facility] CMS Medicare.gov Compare Post Acute Care list provided to:: Patient Choice offered to / list presented to : Patient  Expected Discharge Plan and Services Expected Discharge Plan: [place]   Discharge Planning Services: CM Consult Post Acute Care Choice: Home Health                             HH Arranged: RN, PT, OT, Nurse's Aide [place] Agency: [facility] (now [facility]) Date [place]: 02/26/19 Time [place]: [phone] Representative spoke with at [place] Arrangements/Services   Lives with:: Self Patient language and need for interpreter reviewed:: Yes Do you feel safe going back to the place where you live?: Yes          Current home  services: DME(O2) Criminal Activity/Legal Involvement Pertinent to Current Situation/Hospitalization: No - Comment as needed  Activities of Daily Living Home Assistive Devices/Equipment: Oxygen, Walker (specify type) ADL Screening (condition at time of admission) Patient's cognitive ability adequate to safely complete daily activities?: Yes Is the patient deaf or have difficulty hearing?: No Does the patient have difficulty seeing, even when wearing glasses/contacts?: Yes Does the patient have difficulty concentrating, remembering, or making decisions?: No Patient able to express need for assistance with ADLs?: Yes Does the patient have difficulty dressing or bathing?: No Independently performs ADLs?: Yes (appropriate for developmental age) Does the patient have difficulty walking or climbing stairs?: Yes Weakness of Legs: Both Weakness of Arms/Hands: None  Permission Sought/Granted Permission sought to share information with : Facility Art therapist granted to share information with : Yes, Verbal Permission Granted     Permission granted to share info w AGENCY: [facility]        Emotional Assessment Appearance:: Appears stated age Attitude/Demeanor/Rapport: Self-Confident Affect (typically observed): Accepting Orientation: : Oriented to Self, Oriented to Place, Oriented to  Time, Oriented to Situation Alcohol / Substance Use: Not Applicable    Admission diagnosis:  Acute pulmonary edema ([place]) [J81.0] Elevated troponin [R79.89] Acute on chronic congestive heart failure, unspecified heart failure type [facility]) [I50.9] Patient Active Problem List   Diagnosis  Date Noted  . Acute on chronic diastolic CHF (congestive heart failure) ([place]) 02/23/2019  . Diastolic CHF, acute on chronic ([place]) 01/25/2019  . CAD (coronary artery disease) 12/30/2018  . Acute on chronic renal failure ([place]) 12/30/2018  . Alcoholic intoxication without complication ([place]) 63/89/3734  .  CHF exacerbation ([place]) 11/03/2018  . Chronic diastolic heart failure ([place]) 09/24/2018  . HTN (hypertension) 09/24/2018  . Acute on chronic respiratory failure with hypoxemia ([place]) 08/30/2018  . History of colon cancer   . COPD (chronic obstructive pulmonary disease) ([place]) 01/08/2018  . Cellulitis 11/18/2017  . Hematoma    PCP:  [facility] [place]:   [address], [place] [address] Phone: [phone] Fax: [phone]     Social Determinants of Health (SDOH) Interventions    Readmission Risk Interventions Readmission Risk Prevention Plan 02/26/2019 01/26/2019 01/21/2019  Transportation Screening Complete Complete Complete  Medication Review Press photographer) Complete Complete Complete  PCP or Specialist appointment within 3-5 days of discharge Complete Complete Complete  HRI or Home Care Consult Complete Complete Complete  SW Recovery Care/Counseling Consult Not Complete Not Complete -  SW Consult Not Complete Comments NA N/A -  Palliative Care Screening Not Applicable Not Applicable Not Arab Not Applicable Not Applicable Not Applicable  Some recent data might be hidden

## 2019-02-26 NOTE — Progress Notes (Signed)
[place] at [facility]                                                                                                                                                                                  Patient Demographics   Darius Taylor, is a 73 y.o. male, DOB - [birth year], QQI:[id number]  Admit date - 02/23/2019   Admitting Physician Jude Stark Jock, MD  Outpatient Primary MD for the patient is Inc, Angelina   LOS - 3  Subjective: Patient breathing much better denies any chest pain   Review of Systems:   CONSTITUTIONAL: No documented fever. No fatigue, weakness. No weight gain, no weight loss.  EYES: No blurry or double vision.  ENT: No tinnitus. No postnasal drip. No redness of the oropharynx.  RESPIRATORY: No cough, positive wheeze, no hemoptysis.  Positive dyspnea.  CARDIOVASCULAR: No chest pain. No orthopnea. No palpitations. No syncope.  GASTROINTESTINAL: No nausea, no vomiting or diarrhea. No abdominal pain. No melena or hematochezia.  GENITOURINARY: No dysuria or hematuria.  ENDOCRINE: No polyuria or nocturia. No heat or cold intolerance.  HEMATOLOGY: No anemia. No bruising. No bleeding.  INTEGUMENTARY: No rashes. No lesions.  MUSCULOSKELETAL: No arthritis. No swelling. No gout.  NEUROLOGIC: No numbness, tingling, or ataxia. No seizure-type activity.  PSYCHIATRIC: No anxiety. No insomnia. No ADD.    Vitals:   Vitals:   02/26/19 0421 02/26/19 0805 02/26/19 0819 02/26/19 1115  BP:   103/82   Pulse:   67   Resp:   19   Temp:   97.7 F (36.5 C)   TempSrc:   Oral   SpO2: 93% 94% 93% 93%  Weight: 97.3 kg     Height:        Wt Readings from Last 3 Encounters:  02/26/19 97.3 kg  02/05/19 96.2 kg  02/04/19 96.3 kg     Intake/Output Summary (Last 24 hours) at 02/26/2019 1357 Last data filed at 02/26/2019 1037 Gross per 24 hour  Intake 483 ml  Output 650 ml  Net -167 ml    Physical Exam:   GENERAL: Pleasant-appearing in no  apparent distress.  HEAD, EYES, EARS, NOSE AND THROAT: Atraumatic, normocephalic. Extraocular muscles are intact. Pupils equal and reactive to light. Sclerae anicteric. No conjunctival injection. No oro-pharyngeal erythema.  NECK: Supple. There is no jugular venous distention. No bruits, no lymphadenopathy, no thyromegaly.  HEART: Regular rate and rhythm,. No murmurs, no rubs, no clicks.  LUNGS: Bilateral wheezing improving both lungs  aBDOMEN: Soft, flat, nontender, nondistended. Has good bowel sounds. No hepatosplenomegaly appreciated.  EXTREMITIES: No evidence of any cyanosis, clubbing, or peripheral edema.  +2 pedal and radial  pulses bilaterally.  NEUROLOGIC: The patient is alert, awake, and oriented x3 with no focal motor or sensory deficits appreciated bilaterally.  SKIN: Moist and warm with no rashes appreciated.  Psych: Not anxious, depressed LN: No inguinal LN enlargement    Antibiotics   Anti-infectives (From admission, onward)   Start     Dose/Rate Route Frequency Ordered Stop   02/25/19 1200  amoxicillin-clavulanate (AUGMENTIN) 875-125 MG per tablet 1 tablet     1 tablet Oral Every 12 hours 02/25/19 1159        Medications   Scheduled Meds: . amiodarone  200 mg Oral Daily  . amoxicillin-clavulanate  1 tablet Oral Q12H  . apixaban  5 mg Oral BID  . aspirin  81 mg Oral Daily  . atorvastatin  80 mg Oral Daily  . brimonidine  1 drop Both Eyes TID  . budesonide (PULMICORT) nebulizer solution  0.25 mg Nebulization BID  . cholecalciferol  1,000 Units Oral Daily  . furosemide  40 mg Intravenous Q12H  . guaiFENesin  600 mg Oral BID  . ipratropium-albuterol  3 mL Nebulization Q4H  . latanoprost  1 drop Both Eyes QHS  . magnesium oxide  400 mg Oral BID  . methylPREDNISolone (SOLU-MEDROL) injection  60 mg Intravenous Q6H  . metoprolol  200 mg Oral Daily  . neomycin-bacitracin-polymyxin   Topical BID  . potassium chloride  10 mEq Oral Daily  . sodium chloride flush  3 mL  Intravenous Q12H  . tiotropium  18 mcg Inhalation Daily   Continuous Infusions: . sodium chloride     PRN Meds:.sodium chloride, acetaminophen, diphenhydrAMINE, nitroGLYCERIN, ondansetron (ZOFRAN) IV, sodium chloride flush   Data Review:   Micro Results Recent Results (from the past 240 hour(s))  SARS Coronavirus 2 (CEPHEID- Performed in [place] hospital lab), Hosp Order     Status: None   Collection Time: 02/23/19  1:05 PM  Result Value Ref Range Status   SARS Coronavirus 2 NEGATIVE NEGATIVE Final    Comment: (NOTE) If result is NEGATIVE SARS-CoV-2 target nucleic acids are NOT DETECTED. The SARS-CoV-2 RNA is generally detectable in upper and lower  respiratory specimens during the acute phase of infection. The lowest  concentration of SARS-CoV-2 viral copies this assay can detect is 250  copies / mL. A negative result does not preclude SARS-CoV-2 infection  and should not be used as the sole basis for treatment or other  patient management decisions.  A negative result may occur with  improper specimen collection / handling, submission of specimen other  than nasopharyngeal swab, presence of viral mutation(s) within the  areas targeted by this assay, and inadequate number of viral copies  (<250 copies / mL). A negative result must be combined with clinical  observations, patient history, and epidemiological information. If result is POSITIVE SARS-CoV-2 target nucleic acids are DETECTED. The SARS-CoV-2 RNA is generally detectable in upper and lower  respiratory specimens dur ing the acute phase of infection.  Positive  results are indicative of active infection with SARS-CoV-2.  Clinical  correlation with patient history and other diagnostic information is  necessary to determine patient infection status.  Positive results do  not rule out bacterial infection or co-infection with other viruses. If result is PRESUMPTIVE POSTIVE SARS-CoV-2 nucleic acids MAY BE PRESENT.   A  presumptive positive result was obtained on the submitted specimen  and confirmed on repeat testing.  While 2019 novel coronavirus  (SARS-CoV-2) nucleic acids may be present in the submitted sample  additional confirmatory testing may be necessary for epidemiological  and / or clinical management purposes  to differentiate between  SARS-CoV-2 and other Sarbecovirus currently known to infect humans.  If clinically indicated additional testing with an alternate test  methodology [id number]) is advised. The SARS-CoV-2 RNA is generally  detectable in upper and lower respiratory sp ecimens during the acute  phase of infection. The expected result is Negative. Fact Sheet for Patients:  StrictlyIdeas.no Fact Sheet for Healthcare Providers: BankingDealers.co.za This test is not yet approved or cleared by the [place] FDA and has been authorized for detection and/or diagnosis of SARS-CoV-2 by FDA under an Emergency Use Authorization (EUA).  This EUA will remain in effect (meaning this test can be used) for the duration of the COVID-19 declaration under Section 564(b)(1) of the Act, 21 U.S.C. section 360bbb-3(b)(1), unless the authorization is terminated or revoked sooner. Performed at [facility], [address] [place], [place] [address]   Blood culture (routine x 2)     Status: None (Preliminary result)   Collection Time: 02/23/19  1:05 PM  Result Value Ref Range Status   Specimen Description BLOOD LEFT ANTECUBITAL  Final   Special Requests   Final    BOTTLES DRAWN AEROBIC ONLY Blood Culture results may not be optimal due to an inadequate volume of blood received in culture bottles   Culture   Final    NO GROWTH 3 DAYS Performed at [facility], [address], [place], [place] [address]    Report Status PENDING  Incomplete  Blood culture (routine x 2)     Status: None (Preliminary result)   Collection Time: 02/23/19   1:05 PM  Result Value Ref Range Status   Specimen Description BLOOD RIGHT ANTECUBITAL  Final   Special Requests   Final    BOTTLES DRAWN AEROBIC AND ANAEROBIC Blood Culture adequate volume   Culture   Final    NO GROWTH 3 DAYS Performed at [facility], [address] [place], [place] [address]    Report Status PENDING  Incomplete    Radiology Reports Dg Chest Port 1 View  Result Date: 02/23/2019 CLINICAL DATA:  Shortness of breath EXAM: PORTABLE CHEST 1 VIEW COMPARISON:  Jan 28, 2019 and January 09, 2019 FINDINGS: There is interstitial edema throughout the lungs, essentially stable. There are small pleural effusions bilaterally. There is no frank airspace consolidation. There is cardiomegaly with pulmonary venous hypertension. Pacemaker leads are attached to the right atrium and right ventricle. There is aortic atherosclerosis. No adenopathy. No bone lesions. IMPRESSION: Pulmonary vascular congestion with small pleural effusions and interstitial edema. Appearance is felt to be indicative of a degree of congestive heart failure. There may well be superimposed noncardiogenic interstitial pneumonitis, although there is no frank airspace consolidation. Pacemaker leads attached to right atrium and right ventricle. Aortic Atherosclerosis (ICD10-I70.0). Electronically Signed   By: Lowella Grip III M.D.   On: 02/23/2019 13:25   Dg Chest Port 1 View  Result Date: 01/28/2019 CLINICAL DATA:  Cough and shortness of breath for 1 day EXAM: PORTABLE CHEST 1 VIEW COMPARISON:  Three days ago FINDINGS: Dual-chamber ICD/pacer leads from the left that are stable. Cardiomegaly. Transcatheter aortic valve replacement. Generalized interstitial coarsening. There is mild airspace opacity at the right upper lobe, unchanged. IMPRESSION: Cardiomegaly and vascular congestion without convincing change from prior. There may be superimposed airspace disease. Electronically Signed   By: Monte Fantasia M.D.   On:  01/28/2019 07:07  CBC Recent Labs  Lab 02/23/19 1305 02/24/19 0459 02/25/19 0225  WBC 10.9* 10.6* 10.8*  HGB 8.4* 8.1* 7.6*  HCT 28.0* 27.3* 27.0*  PLT 293 290 301  MCV 97.2 97.5 101.5*  MCH 29.2 28.9 28.6  MCHC 30.0 29.7* 28.1*  RDW 15.9* 15.9* 16.3*    Chemistries  Recent Labs  Lab 02/23/19 1305 02/24/19 0459 02/25/19 0225 02/26/19 0632  NA 138 141 140 139  K 4.0 4.1 4.7 4.4  CL 101 102 102 100  CO2 28 29 30 28   GLUCOSE 161* 116* 106* 191*  BUN 44* 42* 48* 62*  CREATININE 1.79* 1.63* 1.65* 1.92*  CALCIUM 8.6* 8.7* 8.8* 8.8*  MG  --  2.4  --   --   AST 24  --   --   --   ALT 22  --   --   --   ALKPHOS 82  --   --   --   BILITOT 1.1  --   --   --    ------------------------------------------------------------------------------------------------------------------ estimated creatinine clearance is 40.2 mL/min (A) (by C-G formula based on SCr of 1.92 mg/dL (H)). ------------------------------------------------------------------------------------------------------------------ No results for input(s): HGBA1C in the last 72 hours. ------------------------------------------------------------------------------------------------------------------ No results for input(s): CHOL, HDL, LDLCALC, TRIG, CHOLHDL, LDLDIRECT in the last 72 hours. ------------------------------------------------------------------------------------------------------------------ No results for input(s): TSH, T4TOTAL, T3FREE, THYROIDAB in the last 72 hours.  Invalid input(s): FREET3 ------------------------------------------------------------------------------------------------------------------ No results for input(s): VITAMINB12, FOLATE, FERRITIN, TIBC, IRON, RETICCTPCT in the last 72 hours.  Coagulation profile No results for input(s): INR, PROTIME in the last 168 hours.  No results for input(s): DDIMER in the last 72 hours.  Cardiac Enzymes Recent Labs  Lab 02/23/19 1305 02/23/19 1613  02/23/19 1942  TROPONINI 0.08* 0.08* 0.08*   ------------------------------------------------------------------------------------------------------------------ Invalid input(s): POCBNP    Assessment & Plan   Patient is a 73 year old male with history of chronic atrial fibrillation on anticoagulation with Eliquis, chronic kidney disease stage III, COPD, prior history of AICD, chronic diastolic CHF and prior history of asthma admitted with acute hypoxic respiratory failure secondary to CHF exacerbation.  1.  Acute hypoxic respiratory failure secondary to CHF exacerbation. Creatinine increasing change to oral Lasix Continue nebs and steroid  2.  Acute on chronic diastolic CHF exacerbation.   Change to oral Lasix Patient had recent 2D echocardiogram in February 2020 with ejection fraction of 50 to 55%.  No indication to repeat at this time.    Continue on losartan.  Continue Toprol-XL. Cardiology has seen the patient  3.  Chronic atrial fibrillation Rate controlled.  Continue anticoagulation with Eliquis  4.    Acute chronic kidney disease stage III We will change Lasix to orally  5.  Mildly elevated troponin of 0.08 Stable.  Most likely due to supply demand ischemia from CHF.  6.  Anemia chronic disease with worsening hemoglobin I will check iron panel guaiac his stools CBC in the morning     Code Status Orders  (From admission, onward)         Start     Ordered   02/23/19 1507  Full code  Continuous     02/23/19 1509        Code Status History    Date Active Date Inactive Code Status Order ID Comments User Context   01/25/2019 1855 01/30/2019 1905 Full Code [id number]  Otila Back, MD ED   12/30/2018 0224 01/21/2019 1931 Full Code [id number]  Lance Coon, MD Inpatient   11/03/2018 2032 11/12/2018 2045 Full  Code [id number]  Gladstone Lighter, MD Inpatient   08/30/2018 1045 09/02/2018 1616 Full Code [id number]  Fritzi Mandes, MD Inpatient   08/22/2018 1435 08/25/2018 2257 Full  Code [id number]  Fritzi Mandes, MD Inpatient   01/08/2018 1523 01/11/2018 1712 Full Code [id number]  Nicholes Mango, MD ED   11/18/2017 2227 11/23/2017 2033 Full Code [id number]  Vaughan Basta, MD Inpatient   10/21/2016 1216 10/26/2016 2247 Full Code [id number]  Bettey Costa, MD Inpatient           Consults cardiology  DVT Prophylaxis Eliquis  Lab Results  Component Value Date   PLT 301 02/25/2019     Time Spent in minutes 35 minutes greater than 50% of time spent in care coordination and counseling patient regarding the condition and plan of care.   Dustin Flock M.D on 02/26/2019 at 1:57 PM  Between 7am to 6pm - Pager - [phone]  After 6pm go to [email or web address] - Proofreader  [employer]   Office  [phone]

## 2019-02-26 NOTE — Progress Notes (Signed)
Pulmonary Medicine          Date: 02/26/2019,   MRN# [id number] Darius Taylor [birth year]     AdmissionWeight: 97.5 kg                 CurrentWeight: 97.3 kg       SUBJECTIVE   Patient is significanlty improved.  Now on home O2 setting at 3L/min.  Lung auscultation improved bilaterally.  Net neg >3L   PAST MEDICAL HISTORY   Past Medical History:  Diagnosis Date  . Aortic stenosis   . Arrhythmia    atrial fibrillation  . Asthma   . Atrial fibrillation ([place])   . CAD (coronary artery disease)   . Cancer [facility])    colon, bladder and kidney  . CHF (congestive heart failure) ([place])   . 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 COPD (chronic obstructive pulmonary disease) ([place])   . H/O ventricular tachycardia   . Hypertension   . Nonischemic cardiomyopathy ([place])    Status post AICD and pacemaker  . Renal cell carcinoma ([place])      SURGICAL HISTORY   Past Surgical History:  Procedure Laterality Date  . CARDIAC DEFIBRILLATOR PLACEMENT    . CARDIAC DEFIBRILLATOR PLACEMENT    . COLONOSCOPY WITH PROPOFOL N/A 06/10/2018   Procedure: COLONOSCOPY WITH PROPOFOL;  Surgeon: Lin Landsman, MD;  Location: [facility] ENDOSCOPY;  Service: Gastroenterology;  Laterality: N/A;  . CORONARY ANGIOPLASTY  09/25/2016  . IRRIGATION AND DEBRIDEMENT HEMATOMA Left 10/24/2016   Procedure: IRRIGATION AND DEBRIDEMENT HEMATOMA;  Surgeon: Florene Glen, MD;  Location: [facility] ORS;  Service: General;  Laterality: Left;  . KIDNEY SURGERY     left ne[hrectomy for RCC  . PACEMAKER IMPLANT    . PARTIAL COLECTOMY       FAMILY HISTORY   Family History  Problem Relation Age of Onset  . Hypertension Mother   . Stroke Father      SOCIAL HISTORY   Social History   Tobacco Use  . Smoking status: Former Research scientist (life sciences)  . Smokeless tobacco: Never Used  Substance Use Topics  . Alcohol use: No  . Drug use: No     MEDICATIONS    Home Medication:    Current Medication:  Current  Facility-Administered Medications:  .  0.9 %  sodium chloride infusion, 250 mL, Intravenous, PRN, Ojie, Jude, MD .  acetaminophen (TYLENOL) tablet 650 mg, 650 mg, Oral, Q4H PRN, Stark Jock, Jude, MD, 650 mg at 02/24/19 9390 .  amiodarone (PACERONE) tablet 200 mg, 200 mg, Oral, Daily, Ojie, Jude, MD, 200 mg at 02/26/19 0956 .  amoxicillin-clavulanate (AUGMENTIN) 875-125 MG per tablet 1 tablet, 1 tablet, Oral, Q12H, Dustin Flock, MD, 1 tablet at 02/26/19 0956 .  apixaban (ELIQUIS) tablet 5 mg, 5 mg, Oral, BID, Ojie, Jude, MD, 5 mg at 02/26/19 0956 .  aspirin chewable tablet 81 mg, 81 mg, Oral, Daily, Ojie, Jude, MD, 81 mg at 02/26/19 0956 .  atorvastatin (LIPITOR) tablet 80 mg, 80 mg, Oral, Daily, Ojie, Jude, MD, 80 mg at 02/26/19 0956 .  brimonidine (ALPHAGAN) 0.15 % ophthalmic solution 1 drop, 1 drop, Both Eyes, TID, Ojie, Jude, MD, 1 drop at 02/26/19 1706 .  budesonide (PULMICORT) nebulizer solution 0.25 mg, 0.25 mg, Nebulization, BID, Dustin Flock, MD, 0.25 mg at 02/26/19 0805 .  cholecalciferol (VITAMIN D) tablet 1,000 Units, 1,000 Units, Oral, Daily, Ojie, Jude, MD, 1,000 Units at 02/26/19 0957 .  diphenhydrAMINE (BENADRYL) capsule 25 mg, 25 mg, Oral,  QHS PRN, Lance Coon, MD, 25 mg at 02/24/19 2118 .  furosemide (LASIX) tablet 40 mg, 40 mg, Oral, BID, Dustin Flock, MD, 40 mg at 02/26/19 1703 .  guaiFENesin (MUCINEX) 12 hr tablet 600 mg, 600 mg, Oral, BID, Dustin Flock, MD, 600 mg at 02/26/19 0956 .  ipratropium-albuterol (DUONEB) 0.5-2.5 (3) MG/3ML nebulizer solution 3 mL, 3 mL, Nebulization, Q4H, Dustin Flock, MD, 3 mL at 02/26/19 1515 .  latanoprost (XALATAN) 0.005 % ophthalmic solution 1 drop, 1 drop, Both Eyes, QHS, Ojie, Jude, MD, 1 drop at 02/25/19 2119 .  magnesium oxide (MAG-OX) tablet 400 mg, 400 mg, Oral, BID, Ojie, Jude, MD, 400 mg at 02/26/19 0956 .  methylPREDNISolone sodium succinate (SOLU-MEDROL) 125 mg/2 mL injection 60 mg, 60 mg, Intravenous, Q6H, Dustin Flock,  MD, 60 mg at 02/26/19 1703 .  metoprolol succinate (TOPROL-XL) 24 hr tablet 200 mg, 200 mg, Oral, Daily, Ojie, Jude, MD, 200 mg at 02/26/19 0956 .  neomycin-bacitracin-polymyxin (NEOSPORIN) ointment, , Topical, BID, Dustin Flock, MD .  nitroGLYCERIN (NITROSTAT) SL tablet 0.4 mg, 0.4 mg, Sublingual, Q5 min PRN, Ojie, Jude, MD .  ondansetron (ZOFRAN) injection 4 mg, 4 mg, Intravenous, Q6H PRN, Ojie, Jude, MD .  potassium chloride (K-DUR) CR tablet 10 mEq, 10 mEq, Oral, Daily, Ojie, Jude, MD, 10 mEq at 02/26/19 0956 .  sodium chloride flush (NS) 0.9 % injection 3 mL, 3 mL, Intravenous, Q12H, Ojie, Jude, MD, 3 mL at 02/26/19 0958 .  sodium chloride flush (NS) 0.9 % injection 3 mL, 3 mL, Intravenous, PRN, Ojie, Jude, MD .  tiotropium (SPIRIVA) inhalation capsule ([facility] use ONLY) 18 mcg, 18 mcg, Inhalation, Daily, Ojie, Jude, MD, 18 mcg at 02/26/19 6333    ALLERGIES   Patient has no known allergies.     REVIEW OF SYSTEMS    Review of Systems:  Gen:  Denies  fever, sweats, chills weigh loss  HEENT: Denies blurred vision, double vision, ear pain, eye pain, hearing loss, nose bleeds, sore throat Cardiac:  No dizziness, chest pain or heaviness, chest tightness,edema Resp:   Denies cough or sputum porduction, shortness of breath,wheezing, hemoptysis,  Gi: Denies swallowing difficulty, stomach pain, nausea or vomiting, diarrhea, constipation, bowel incontinence Gu:  Denies bladder incontinence, burning urine Ext:   Denies Joint pain, stiffness or swelling Skin: Denies  skin rash, easy bruising or bleeding or hives Endoc:  Denies polyuria, polydipsia , polyphagia or weight change Psych:   Denies depression, insomnia or hallucinations   Other:  All other systems negative   VS: BP 123/67   Pulse 62   Temp 98 F (36.7 C) (Oral)   Resp 19   Ht 5\' 11"  (1.803 m)   Wt 97.3 kg   SpO2 95%   BMI 29.92 kg/m      PHYSICAL EXAM    GENERAL:NAD, no fevers, chills, no weakness no fatigue  HEAD: Normocephalic, atraumatic.  EYES: Pupils equal, round, reactive to light. Extraocular muscles intact. No scleral icterus.  MOUTH: Moist mucosal membrane. Dentition intact. No abscess noted.  EAR, NOSE, THROAT: Clear without exudates. No external lesions.  NECK: Supple. No thyromegaly. No nodules. No JVD.  PULMONARY: Mild bibasilar crackles CARDIOVASCULAR: S1 and S2. Regular rate and rhythm. No murmurs, rubs, or gallops. No edema. Pedal pulses 2+ bilaterally.  GASTROINTESTINAL: Soft, nontender, nondistended. No masses. Positive bowel sounds. No hepatosplenomegaly.  MUSCULOSKELETAL: No swelling, clubbing, or edema. Range of motion full in all extremities.  NEUROLOGIC: Cranial nerves II through XII are intact. No gross focal  neurological deficits. Sensation intact. Reflexes intact.  SKIN: No ulceration, lesions, rashes, or cyanosis. Skin warm and dry. Turgor intact.  PSYCHIATRIC: Mood, affect within normal limits. The patient is awake, alert and oriented x 3. Insight, judgment intact.       IMAGING    Dg Chest Port 1 View  Result Date: 02/23/2019 CLINICAL DATA:  Shortness of breath EXAM: PORTABLE CHEST 1 VIEW COMPARISON:  Jan 28, 2019 and January 09, 2019 FINDINGS: There is interstitial edema throughout the lungs, essentially stable. There are small pleural effusions bilaterally. There is no frank airspace consolidation. There is cardiomegaly with pulmonary venous hypertension. Pacemaker leads are attached to the right atrium and right ventricle. There is aortic atherosclerosis. No adenopathy. No bone lesions. IMPRESSION: Pulmonary vascular congestion with small pleural effusions and interstitial edema. Appearance is felt to be indicative of a degree of congestive heart failure. There may well be superimposed noncardiogenic interstitial pneumonitis, although there is no frank airspace consolidation. Pacemaker leads attached to right atrium and right ventricle. Aortic Atherosclerosis  (ICD10-I70.0). Electronically Signed   By: Lowella Grip III M.D.   On: 02/23/2019 13:25   Dg Chest Port 1 View  Result Date: 01/28/2019 CLINICAL DATA:  Cough and shortness of breath for 1 day EXAM: PORTABLE CHEST 1 VIEW COMPARISON:  Three days ago FINDINGS: Dual-chamber ICD/pacer leads from the left that are stable. Cardiomegaly. Transcatheter aortic valve replacement. Generalized interstitial coarsening. There is mild airspace opacity at the right upper lobe, unchanged. IMPRESSION: Cardiomegaly and vascular congestion without convincing change from prior. There may be superimposed airspace disease. Electronically Signed   By: Monte Fantasia M.D.   On: 01/28/2019 07:07      ASSESSMENT/PLAN   Acute on chronic hypoxemic respiratory failure due to Acute on chronic systolic and diastolic decompensated CHF with EF 50 to 10% complicated by advanced COPD with chronic hypoxemia, severe anemia and possible pulmonary hypertension suggested by severely elevated RVSP most recent echo -Agree with diuresis -Cardiology on case appreciate input -Strict I's and O's -Fluid intake restirction 1200cc >3L diuresed     Advanced COPD -continue COPD carepath - DuoNEb, pulmicort -MetaNEB -Acapella -IS at bedside 10x/hr -ordering small O2 concentrator for paitent.    Thank you for allowing me to participate in the care of this patient.   Patient/Family are satisfied with care plan and all questions have been answered.  This document was prepared using Dragon voice recognition software and may include unintentional dictation errors.     Thank you for allowing me to participate in the care of this patient.   Patient/Family are satisfied with care plan and all questions have been answered.  This document was prepared using Dragon voice recognition software and may include unintentional dictation errors.     Ottie Glazier, M.D.  Division of [place]

## 2019-02-26 NOTE — Plan of Care (Signed)
On 6L O2 currently . Receiving IV steriods   Problem: Activity: Goal: Risk for activity intolerance will decrease Outcome: Progressing Note:  Up in chair most of the night standby assist   Problem: Nutrition: Goal: Adequate nutrition will be maintained Outcome: Progressing   Problem: Coping: Goal: Level of anxiety will decrease Outcome: Progressing   Problem: Pain Managment: Goal: General experience of comfort will improve Outcome: Progressing Note:  No complaints of pain this shift   Problem: Safety: Goal: Ability to remain free from injury will improve Outcome: Progressing   Problem: Cardiac: Goal: Ability to achieve and maintain adequate cardiopulmonary perfusion will improve Outcome: Progressing Note:  Receiving IV lasix

## 2019-02-27 LAB — CBC
HCT: 27 % — ABNORMAL LOW (ref 39.0–52.0)
Hemoglobin: 8.1 g/dL — ABNORMAL LOW (ref 13.0–17.0)
MCH: 29.1 pg (ref 26.0–34.0)
MCHC: 30 g/dL (ref 30.0–36.0)
MCV: 97.1 fL (ref 80.0–100.0)
Platelets: 358 10*3/uL (ref 150–400)
RBC: 2.78 MIL/uL — ABNORMAL LOW (ref 4.22–5.81)
RDW: 16.2 % — ABNORMAL HIGH (ref 11.5–15.5)
WBC: 22 10*3/uL — ABNORMAL HIGH (ref 4.0–10.5)
nRBC: 0 % (ref 0.0–0.2)

## 2019-02-27 LAB — BASIC METABOLIC PANEL
Anion gap: 12 (ref 5–15)
BUN: 90 mg/dL — ABNORMAL HIGH (ref 8–23)
CO2: 23 mmol/L (ref 22–32)
Calcium: 8.6 mg/dL — ABNORMAL LOW (ref 8.9–10.3)
Chloride: 102 mmol/L (ref 98–111)
Creatinine, Ser: 2.4 mg/dL — ABNORMAL HIGH (ref 0.61–1.24)
GFR calc Af Amer: 30 mL/min — ABNORMAL LOW (ref >60)
GFR calc non Af Amer: 26 mL/min — ABNORMAL LOW (ref >60)
Glucose, Bld: 179 mg/dL — ABNORMAL HIGH (ref 70–99)
Potassium: 5 mmol/L (ref 3.5–5.1)
Sodium: 137 mmol/L (ref 135–145)

## 2019-02-27 MED ORDER — VITAMIN B-12 1000 MCG PO TABS
1000.0000 ug | ORAL_TABLET | Freq: Every day | ORAL | Status: DC
  Administered 2019-02-27 – 2019-03-05 (×7): 1000 ug via ORAL
  Filled 2019-02-27 (×7): qty 1

## 2019-02-27 MED ORDER — FUROSEMIDE 10 MG/ML IJ SOLN
20.0000 mg | Freq: Two times a day (BID) | INTRAMUSCULAR | Status: DC
  Administered 2019-02-27 – 2019-03-01 (×5): 20 mg via INTRAVENOUS
  Filled 2019-02-27 (×6): qty 2

## 2019-02-27 MED ORDER — SODIUM CHLORIDE 0.9 % IV SOLN
3.0000 g | Freq: Four times a day (QID) | INTRAVENOUS | Status: DC
  Administered 2019-02-27 – 2019-03-02 (×12): 3 g via INTRAVENOUS
  Filled 2019-02-27 (×15): qty 3

## 2019-02-27 MED ORDER — CHLORHEXIDINE GLUCONATE CLOTH 2 % EX PADS
6.0000 | MEDICATED_PAD | Freq: Every day | CUTANEOUS | Status: AC
  Administered 2019-02-28 – 2019-03-02 (×3): 6 via TOPICAL

## 2019-02-27 MED ORDER — MUPIROCIN 2 % EX OINT
1.0000 "application " | TOPICAL_OINTMENT | Freq: Two times a day (BID) | CUTANEOUS | Status: AC
  Administered 2019-02-27 – 2019-03-03 (×9): 1 via NASAL
  Filled 2019-02-27: qty 22

## 2019-02-27 NOTE — Progress Notes (Signed)
Pt have a WBC of 22.0 from 10.8 on 02/25/2019. Page prime and talked to dr. Sidney Ace and ordered to have a wound culture on wound on his left thigh. Will continue to monitor.

## 2019-02-27 NOTE — Plan of Care (Signed)
  Problem: Education: Goal: Knowledge of General Education information will improve Description Including pain rating scale, medication(s)/side effects and non-pharmacologic comfort measures Outcome: Progressing   Problem: Activity: Goal: Risk for activity intolerance will decrease Outcome: Progressing   Problem: Safety: Goal: Ability to remain free from injury will improve Outcome: Progressing   Problem: Activity: Goal: Capacity to carry out activities will improve Outcome: Progressing

## 2019-02-27 NOTE — TOC Progression Note (Addendum)
Transition of Care [facility]) - Progression Note    Patient Details  Name: Darius Taylor MRN: [id number] Date of Birth: 1946-03-08  Transition of Care [facility]) CM/SW Contact  Katrina Stack, RN Phone Number: 02/27/2019, 6:34 PM  Clinical Narrative:   Obtained order for in home portable oxygen concentrator assessment with conserving device. [employer] aware.  Informed    Expected Discharge Plan: [place] Barriers to Discharge: Continued Medical Work up  Expected Discharge Plan and Services Expected Discharge Plan: [name]   Discharge Planning Services: CM Consult Post Acute Care Choice: Home Health                             HH Arranged: RN, PT, OT, Nurse's Aide [place] Agency: [facility] (now [facility]) Date [place]: 02/26/19 Time [place]: [phone] Representative spoke with at [place]: [place] ([place]) Interventions    Readmission Risk Interventions Readmission Risk Prevention Plan 02/26/2019 01/26/2019 01/21/2019  Transportation Screening Complete Complete Complete  Medication Review Press photographer) Complete Complete Complete  PCP or Specialist appointment within 3-5 days of discharge Complete Complete Complete  HRI or Home Care Consult Complete Complete Complete  SW Recovery Care/Counseling Consult Not Complete Not Complete -  SW Consult Not Complete Comments NA N/A -  Palliative Care Screening Not Applicable Not Applicable Not [place] Not Applicable Not Applicable Not Applicable  Some recent data might be hidden

## 2019-02-27 NOTE — Consult Note (Signed)
Pharmacy Antibiotic Note  Darius Taylor is a 73 y.o. male admitted on 02/23/2019 with cellulitis.  Pharmacy has been consulted for Unasyn dosing.  Plan: Unasyn 3 g q8H. Monitor CrCl, if < 30 mL/min switch to 3g q12H.   Height: 5\' 11"  (180.3 cm) Weight: 217 lb 9.6 oz (98.7 kg) IBW/kg (Calculated) : 75.3  Temp (24hrs), Avg:97.8 F (36.6 C), Min:97.7 F (36.5 C), Max:98 F (36.7 C)  Recent Labs  Lab 02/23/19 1305 02/24/19 0459 02/25/19 0225 02/26/19 0632 02/27/19 0509  WBC 10.9* 10.6* 10.8*  --  22.0*  CREATININE 1.79* 1.63* 1.65* 1.92* 2.40*  LATICACIDVEN 1.9  --   --   --   --     Estimated Creatinine Clearance: 32.4 mL/min (A) (by C-G formula based on SCr of 2.4 mg/dL (H)).    No Known Allergies  Antimicrobials this admission: 6/3 augmentin >> 6/5 6/5 Unasyn >>   Dose adjustments this admission: None  Microbiology results: 6/1 BCx: pending  Thank you for allowing pharmacy to be a part of this patient's care.  Oswald Hillock, PharmD, BCPS 02/27/2019 11:57 AM

## 2019-02-27 NOTE — Consult Note (Signed)
Date: 02/27/2019                  Patient Name:  Darius Taylor  MRN: [id number]  DOB: 1946/01/23  Age / Sex: 73 y.o., male         PCP: [facility]                 Service Requesting Consult:  Internal medicine/ Dustin Flock, MD                 Reason for Consult:  Acute renal failure            History of Present Illness: Patient is a 73 y.o. male with medical problems of coronary disease, history of PCI 2013, nonischemic dilated cardiomyopathy, AICD 2006, aortic stenosis, Taylor 2018, atrial fibrillation on Eliquis, CKD stage III, COPD with 3 L supplemental oxygen, who was admitted to [facility] on 02/23/2019 for evaluation of shortness of breath.   Patient presented to the emergency room via EMS from home for evaluation of shortness of breath.  He requires oxygen supplementation at baseline 3 L per nasal cannula.  He was placed on 15 L nonrebreather by EMS.  Patient was diagnosed with chronic diastolic congestive heart failure exacerbation.  Serum creatinine at baseline appears to be 1.65/GFR 40.  It has increased to 2.40 today.  Nephrology consult has now been requested for evaluation.  CT of the chest shows new groundglass opacity in the right upper lobe concerning for pneumonia.  Primary artery calcifications and aortic atherosclerosis was also noted.. 2D echo from February 2020 shows normal LV systolic function of 50 to 55%.  Normal cavity size.  Normal right systolic function.  Severely dilated left atrium as well as right atrium.  Moderate tricuspid regurgitation.  Severe calcification of aortic valve with mild stenosis.  Severely elevated right ventricular systolic pressure.  Medications: Outpatient medications: Medications Prior to Admission  Medication Sig Dispense Refill Last Dose  . acetaminophen (TYLENOL) 325 MG tablet Take 650 mg by mouth every 6 (six) hours as needed for mild pain or fever.    prn at prn  . allopurinol (ZYLOPRIM) 100 MG tablet Take 200 mg by  mouth daily.   02/23/2019 at 1000  . amiodarone (PACERONE) 200 MG tablet Take 200 mg by mouth daily.   02/23/2019 at 1000  . amLODipine (NORVASC) 10 MG tablet Take 10 mg by mouth daily.   02/23/2019 at 1000  . apixaban (ELIQUIS) 5 MG TABS tablet Take 5 mg by mouth 2 (two) times daily.   02/23/2019 at 1000  . aspirin 81 MG chewable tablet Chew 81 mg by mouth daily.   02/23/2019 at 1000  . atorvastatin (LIPITOR) 80 MG tablet Take 80 mg by mouth daily.   02/22/2019 at Unknown time  . brimonidine (ALPHAGAN) 0.15 % ophthalmic solution Place 1 drop into both eyes 3 (three) times daily.  12 02/23/2019 at Unknown time  . budesonide-formoterol (SYMBICORT) 160-4.5 MCG/ACT inhaler Inhale 2 puffs into the lungs 2 (two) times daily.   02/23/2019 at Unknown time  . cholecalciferol (VITAMIN D) 1000 units tablet Take 1,000 Units by mouth daily.   02/23/2019 at Unknown time  . furosemide (LASIX) 40 MG tablet Take 1 tablet (40 mg total) by mouth 2 (two) times daily. (Patient taking differently: Take 40 mg by mouth 2 (two) times daily. May take an extra if needed) 30 tablet 0 02/23/2019 at 1000  . latanoprost (XALATAN) 0.005 % ophthalmic solution Place  1 drop into both eyes at bedtime.  7 02/22/2019 at Unknown time  . magnesium oxide (MAG-OX) 400 MG tablet Take 400 mg by mouth 2 (two) times daily.   02/23/2019 at Unknown time  . metoprolol (TOPROL-XL) 200 MG 24 hr tablet Take 200 mg by mouth daily.   02/23/2019 at 1000  . nitroGLYCERIN (NITROSTAT) 0.4 MG SL tablet Place 0.4 mg under the tongue every 5 (five) minutes as needed for chest pain.   prn at prn  . spironolactone (ALDACTONE) 25 MG tablet Take 12.5 mg by mouth daily.   02/23/2019 at Unknown time  . tiotropium (SPIRIVA) 18 MCG inhalation capsule Place 18 mcg into inhaler and inhale daily.   02/23/2019 at Unknown time    Current medications: Current Facility-Administered Medications  Medication Dose Route Frequency Provider Last Rate Last Dose  . 0.9 %  sodium chloride infusion  250 mL  Intravenous PRN Ojie, Jude, MD 10 mL/hr at 02/27/19 1301 250 mL at 02/27/19 1301  . acetaminophen (TYLENOL) tablet 650 mg  650 mg Oral Q4H PRN Stark Jock, Jude, MD   650 mg at 02/24/19 4166  . amiodarone (PACERONE) tablet 200 mg  200 mg Oral Daily Ojie, Jude, MD   200 mg at 02/27/19 1023  . Ampicillin-Sulbactam (UNASYN) 3 g in sodium chloride 0.9 % 100 mL IVPB  3 g Intravenous QID Dustin Flock, MD 200 mL/hr at 02/27/19 1304 3 g at 02/27/19 1304  . apixaban (ELIQUIS) tablet 5 mg  5 mg Oral BID Stark Jock, Jude, MD   5 mg at 02/27/19 1023  . aspirin chewable tablet 81 mg  81 mg Oral Daily Ojie, Jude, MD   81 mg at 02/27/19 1023  . atorvastatin (LIPITOR) tablet 80 mg  80 mg Oral Daily Ojie, Jude, MD   80 mg at 02/27/19 1024  . brimonidine (ALPHAGAN) 0.15 % ophthalmic solution 1 drop  1 drop Both Eyes TID Stark Jock, Jude, MD   1 drop at 02/27/19 1027  . budesonide (PULMICORT) nebulizer solution 0.25 mg  0.25 mg Nebulization BID Dustin Flock, MD   0.25 mg at 02/27/19 0836  . Chlorhexidine Gluconate Cloth 2 % PADS 6 each  6 each Topical Q0600 Dustin Flock, MD      . cholecalciferol (VITAMIN D) tablet 1,000 Units  1,000 Units Oral Daily Stark Jock, Jude, MD   1,000 Units at 02/27/19 1023  . diphenhydrAMINE (BENADRYL) capsule 25 mg  25 mg Oral QHS PRN Lance Coon, MD   25 mg at 02/24/19 2118  . guaiFENesin (MUCINEX) 12 hr tablet 600 mg  600 mg Oral BID Dustin Flock, MD   600 mg at 02/27/19 1024  . ipratropium-albuterol (DUONEB) 0.5-2.5 (3) MG/3ML nebulizer solution 3 mL  3 mL Nebulization Q4H Dustin Flock, MD   3 mL at 02/27/19 1208  . latanoprost (XALATAN) 0.005 % ophthalmic solution 1 drop  1 drop Both Eyes QHS Ojie, Jude, MD   1 drop at 02/26/19 2203  . magnesium oxide (MAG-OX) tablet 400 mg  400 mg Oral BID Stark Jock, Jude, MD   400 mg at 02/27/19 1024  . methylPREDNISolone sodium succinate (SOLU-MEDROL) 125 mg/2 mL injection 60 mg  60 mg Intravenous Q6H Dustin Flock, MD   60 mg at 02/27/19 1252  . metoprolol  succinate (TOPROL-XL) 24 hr tablet 200 mg  200 mg Oral Daily Ojie, Jude, MD   200 mg at 02/27/19 1024  . mupirocin ointment (BACTROBAN) 2 % 1 application  1 application Nasal BID Dustin Flock, MD  1 application at 87/68/11 1305  . neomycin-bacitracin-polymyxin (NEOSPORIN) ointment   Topical BID Dustin Flock, MD      . nitroGLYCERIN (NITROSTAT) SL tablet 0.4 mg  0.4 mg Sublingual Q5 min PRN Ojie, Jude, MD      . ondansetron (ZOFRAN) injection 4 mg  4 mg Intravenous Q6H PRN Ojie, Jude, MD      . sodium chloride flush (NS) 0.9 % injection 3 mL  3 mL Intravenous Q12H Ojie, Jude, MD   3 mL at 02/27/19 1029  . sodium chloride flush (NS) 0.9 % injection 3 mL  3 mL Intravenous PRN Ojie, Jude, MD      . tiotropium (SPIRIVA) inhalation capsule ([facility] use ONLY) 18 mcg  18 mcg Inhalation Daily Ojie, Jude, MD   18 mcg at 02/27/19 1028      Allergies: No Known Allergies    Past Medical History: Past Medical History:  Diagnosis Date  . Aortic stenosis   . Arrhythmia    atrial fibrillation  . Asthma   . Atrial fibrillation ([place])   . CAD (coronary artery disease)   . Cancer [facility])    colon, bladder and kidney  . CHF (congestive heart failure) ([place])   . 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 COPD (chronic obstructive pulmonary disease) ([place])   . H/O ventricular tachycardia   . Hypertension   . Nonischemic cardiomyopathy ([place])    Status post AICD and pacemaker  . Renal cell carcinoma [facility])      Past Surgical History: Past Surgical History:  Procedure Laterality Date  . CARDIAC DEFIBRILLATOR PLACEMENT    . CARDIAC DEFIBRILLATOR PLACEMENT    . COLONOSCOPY WITH PROPOFOL N/A 06/10/2018   Procedure: COLONOSCOPY WITH PROPOFOL;  Surgeon: Lin Landsman, MD;  [facility] ENDOSCOPY;  Service: Gastroenterology;  Laterality: N/A;  . CORONARY ANGIOPLASTY  09/25/2016  . IRRIGATION AND DEBRIDEMENT HEMATOMA Left 10/24/2016   Procedure: IRRIGATION AND DEBRIDEMENT HEMATOMA;  Surgeon: Florene Glen,  MD;  Location: [facility] ORS;  Service: General;  Laterality: Left;  . KIDNEY SURGERY     left ne[hrectomy for RCC  . PACEMAKER IMPLANT    . PARTIAL COLECTOMY       Family History: Family History  Problem Relation Age of Onset  . Hypertension Mother   . Stroke Father      Social History: Social History   Socioeconomic History  . Marital status: Widowed    Spouse name: Not on file  . Number of children: Not on file  . Years of education: Not on file  . Highest education level: Not on file  Occupational History  . Not on file  Social Needs  . Financial resource strain: Not hard at all  . Food insecurity:    Worry: Not on file    Inability: Never true  . Transportation needs:    Medical: No    Non-medical: No  Tobacco Use  . Smoking status: Former Research scientist (life sciences)  . Smokeless tobacco: Never Used  Substance and Sexual Activity  . Alcohol use: No  . Drug use: No  . Sexual activity: Not on file  Lifestyle  . Physical activity:    Days per week: 3 days    Minutes per session: 10 min  . Stress: Not at all  Relationships  . Social connections:    Talks on phone: Patient refused    Gets together: Patient refused    Attends religious service: Patient refused    Active member of club or organization: Not  on file    Attends meetings of clubs or organizations: Patient refused    Relationship status: Patient refused  . Intimate partner violence:    Fear of current or ex partner: Patient refused    Emotionally abused: Patient refused    Physically abused: Patient refused    Forced sexual activity: Patient refused  Other Topics Concern  . Not on file  Social History Narrative   Lives at home by himself.  Independent at baseline    Gen: Denies any fevers or chills HEENT: No vision or hearing problems CV: No chest pain, c/o shortness of breath with exertion Resp: significant cough, no sputum production GI: No nausea, vomiting or diarrhea.  No blood in the stool GU : No problems  with voiding.  No hematuria.  No previous history of kidney problems MS: Ambulatory, sometimes uses walker.  Denies any acute joint pain or swelling Derm:   No complaints Psych: No complaints Heme: No complaints Neuro: No complaints Endocrine: No complaints  Vital Signs: Blood pressure 111/74, pulse 61, temperature 97.8 F (36.6 C), temperature source Oral, resp. rate 18, height 5\' 11"  (1.803 m), weight 98.7 kg, SpO2 96 %.   Intake/Output Summary (Last 24 hours) at 02/27/2019 1400 Last data filed at 02/27/2019 1100 Gross per 24 hour  Intake 480 ml  Output 685 ml  Net -205 ml    Weight trends: Filed Weights   02/25/19 0507 02/26/19 0421 02/27/19 0439  Weight: 97.7 kg 97.3 kg 98.7 kg    Physical Exam: General:  Elderly gentleman, laying in the bed  HEENT  anicteric, moist oral mucous membranes  Neck:  Supple, no masses  Lungs:  Mild bilateral crackles  Heart::  Systolic murmur, no rub  Abdomen:  Obese, soft, nontender  Extremities:  + Edema bilaterally  Neurologic:  Alert, oriented  Skin:  No acute rashes    Lab results: Basic Metabolic Panel: Recent Labs  Lab 02/24/19 0459 02/25/19 0225 02/26/19 0632 02/27/19 0509  NA 141 140 139 137  K 4.1 4.7 4.4 5.0  CL 102 102 100 102  CO2 29 30 28 23   GLUCOSE 116* 106* 191* 179*  BUN 42* 48* 62* 90*  CREATININE 1.63* 1.65* 1.92* 2.40*  CALCIUM 8.7* 8.8* 8.8* 8.6*  MG 2.4  --   --   --     Liver Function Tests: Recent Labs  Lab 02/23/19 1305  AST 24  ALT 22  ALKPHOS 82  BILITOT 1.1  PROT 7.9  ALBUMIN 3.3*   No results for input(s): LIPASE, AMYLASE in the last 168 hours. No results for input(s): AMMONIA in the last 168 hours.  CBC: Recent Labs  Lab 02/25/19 0225 02/27/19 0509  WBC 10.8* 22.0*  HGB 7.6* 8.1*  HCT 27.0* 27.0*  MCV 101.5* 97.1  PLT 301 358    Cardiac Enzymes: Recent Labs  Lab 02/23/19 1942  TROPONINI 0.08*    BNP: Invalid input(s): POCBNP  CBG: No results for input(s): GLUCAP  in the last 168 hours.  Microbiology: Recent Results (from the past 720 hour(s))  SARS Coronavirus 2 (CEPHEID- Performed in [place] hospital lab), Hosp Order     Status: None   Collection Time: 02/23/19  1:05 PM  Result Value Ref Range Status   SARS Coronavirus 2 NEGATIVE NEGATIVE Final    Comment: (NOTE) If result is NEGATIVE SARS-CoV-2 target nucleic acids are NOT DETECTED. The SARS-CoV-2 RNA is generally detectable in upper and lower  respiratory specimens during the acute phase  of infection. The lowest  concentration of SARS-CoV-2 viral copies this assay can detect is 250  copies / mL. A negative result does not preclude SARS-CoV-2 infection  and should not be used as the sole basis for treatment or other  patient management decisions.  A negative result may occur with  improper specimen collection / handling, submission of specimen other  than nasopharyngeal swab, presence of viral mutation(s) within the  areas targeted by this assay, and inadequate number of viral copies  (<250 copies / mL). A negative result must be combined with clinical  observations, patient history, and epidemiological information. If result is POSITIVE SARS-CoV-2 target nucleic acids are DETECTED. The SARS-CoV-2 RNA is generally detectable in upper and lower  respiratory specimens dur ing the acute phase of infection.  Positive  results are indicative of active infection with SARS-CoV-2.  Clinical  correlation with patient history and other diagnostic information is  necessary to determine patient infection status.  Positive results do  not rule out bacterial infection or co-infection with other viruses. If result is PRESUMPTIVE POSTIVE SARS-CoV-2 nucleic acids MAY BE PRESENT.   A presumptive positive result was obtained on the submitted specimen  and confirmed on repeat testing.  While 2019 novel coronavirus  (SARS-CoV-2) nucleic acids may be present in the submitted sample  additional  confirmatory testing may be necessary for epidemiological  and / or clinical management purposes  to differentiate between  SARS-CoV-2 and other Sarbecovirus currently known to infect humans.  If clinically indicated additional testing with an alternate test  methodology [phone]) is advised. The SARS-CoV-2 RNA is generally  detectable in upper and lower respiratory sp ecimens during the acute  phase of infection. The expected result is Negative. Fact Sheet for Patients:  StrictlyIdeas.no Fact Sheet for Healthcare Providers: BankingDealers.co.za This test is not yet approved or cleared by the [place] FDA and has been authorized for detection and/or diagnosis of SARS-CoV-2 by FDA under an Emergency Use Authorization (EUA).  This EUA will remain in effect (meaning this test can be used) for the duration of the COVID-19 declaration under Section 564(b)(1) of the Act, 21 U.S.C. section 360bbb-3(b)(1), unless the authorization is terminated or revoked sooner. Performed at [facility], [address] [place], [place] [address]   Blood culture (routine x 2)     Status: None (Preliminary result)   Collection Time: 02/23/19  1:05 PM  Result Value Ref Range Status   Specimen Description BLOOD LEFT ANTECUBITAL  Final   Special Requests   Final    BOTTLES DRAWN AEROBIC ONLY Blood Culture results may not be optimal due to an inadequate volume of blood received in culture bottles   Culture   Final    NO GROWTH 4 DAYS Performed at [facility], [address], [place], [place] [address]    Report Status PENDING  Incomplete  Blood culture (routine x 2)     Status: None (Preliminary result)   Collection Time: 02/23/19  1:05 PM  Result Value Ref Range Status   Specimen Description BLOOD RIGHT ANTECUBITAL  Final   Special Requests   Final    BOTTLES DRAWN AEROBIC AND ANAEROBIC Blood Culture adequate volume   Culture   Final     NO GROWTH 4 DAYS Performed at [facility], [address], [place], [place] [address]    Report Status PENDING  Incomplete  Aerobic Culture (superficial specimen)     Status: None (Preliminary result)   Collection Time:  02/27/19 10:41 AM  Result Value Ref Range Status   Specimen Description   Final    WOUND LEFT THIGH Performed at [facility], [place] [address] [place], [place] [address]    Special Requests   Final    NONE Performed at [facility], [place], [place] [address]    Gram Stain PENDING  Incomplete   Culture PENDING  Incomplete   Report Status PENDING  Incomplete     Coagulation Studies: No results for input(s): LABPROT, INR in the last 72 hours.  Urinalysis: No results for input(s): COLORURINE, LABSPEC, PHURINE, GLUCOSEU, HGBUR, BILIRUBINUR, KETONESUR, PROTEINUR, UROBILINOGEN, NITRITE, LEUKOCYTESUR in the last 72 hours.  Invalid input(s): APPERANCEUR      Imaging:  No results found.   Assessment & Plan: Pt is a 73 y.o. African-American  male with medical problems of coronary disease, history of PCI 2013, nonischemic dilated cardiomyopathy, AICD 2006, aortic stenosis, Taylor 2018, atrial fibrillation on Eliquis, CKD stage III, COPD with 3 L supplemental oxygen, was admitted on 02/23/2019 with worsening shortness of breath and lower extremity edema.   1.  Acute kidney injury on chronic kidney disease stage III Baseline creatinine 1.65/GFR 47 at the time of admission on July 3 Creatinine increased to 1.9 and then to 2.40 today with diuresis.  However patient is still short of breath with lower extremity edema.  Furosemide has now been held  2.  Shortness of breath and volume overload with lower extremity edema Plan to resume Lasix at a lower dose of 20 mg twice a day Avoid hypotension May need to reduce the dose of metoprolol if possible to keep blood pressure slightly high for adequate renal perfusion     LOS:  [place] 6/5/20202:00 PM [place], [place] [address] Phone [phone]. FAx [phone]   Note: This note was prepared with Dragon dictation. Any transcription errors are unintentional

## 2019-02-27 NOTE — Progress Notes (Signed)
Pulmonary Medicine          Date: 02/27/2019,   MRN# [id number] Darius Taylor 1946/03/09     AdmissionWeight: 97.5 kg                 CurrentWeight: 98.7 kg        Subjective   Patient resting in bed in no distress.  He is on home O2 setting now.  BP softer, diuresis decrased.    PAST MEDICAL HISTORY   Past Medical History:  Diagnosis Date  . Aortic stenosis   . Arrhythmia    atrial fibrillation  . Asthma   . Atrial fibrillation ([place])   . CAD (coronary artery disease)   . Cancer [facility])    colon, bladder and kidney  . CHF (congestive heart failure) ([place])   . 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 COPD (chronic obstructive pulmonary disease) ([place])   . H/O ventricular tachycardia   . Hypertension   . Nonischemic cardiomyopathy ([place])    Status post AICD and pacemaker  . Renal cell carcinoma ([place])      SURGICAL HISTORY   Past Surgical History:  Procedure Laterality Date  . CARDIAC DEFIBRILLATOR PLACEMENT    . CARDIAC DEFIBRILLATOR PLACEMENT    . COLONOSCOPY WITH PROPOFOL N/A 06/10/2018   Procedure: COLONOSCOPY WITH PROPOFOL;  Surgeon: Lin Landsman, MD;  Location: [facility] ENDOSCOPY;  Service: Gastroenterology;  Laterality: N/A;  . CORONARY ANGIOPLASTY  09/25/2016  . IRRIGATION AND DEBRIDEMENT HEMATOMA Left 10/24/2016   Procedure: IRRIGATION AND DEBRIDEMENT HEMATOMA;  Surgeon: Florene Glen, MD;  Location: [facility] ORS;  Service: General;  Laterality: Left;  . KIDNEY SURGERY     left ne[hrectomy for RCC  . PACEMAKER IMPLANT    . PARTIAL COLECTOMY       FAMILY HISTORY   Family History  Problem Relation Age of Onset  . Hypertension Mother   . Stroke Father      SOCIAL HISTORY   Social History   Tobacco Use  . Smoking status: Former Research scientist (life sciences)  . Smokeless tobacco: Never Used  Substance Use Topics  . Alcohol use: No  . Drug use: No     MEDICATIONS    Home Medication:    Current Medication:  Current Facility-Administered Medications:   .  0.9 %  sodium chloride infusion, 250 mL, Intravenous, PRN, Ojie, Jude, MD, Last Rate: 10 mL/hr at 02/27/19 1705, 250 mL at 02/27/19 1705 .  acetaminophen (TYLENOL) tablet 650 mg, 650 mg, Oral, Q4H PRN, Stark Jock, Jude, MD, 650 mg at 02/24/19 6962 .  amiodarone (PACERONE) tablet 200 mg, 200 mg, Oral, Daily, Ojie, Jude, MD, 200 mg at 02/27/19 1023 .  Ampicillin-Sulbactam (UNASYN) 3 g in sodium chloride 0.9 % 100 mL IVPB, 3 g, Intravenous, QID, Dustin Flock, MD, Last Rate: 200 mL/hr at 02/27/19 1707, 3 g at 02/27/19 1707 .  apixaban (ELIQUIS) tablet 5 mg, 5 mg, Oral, BID, Ojie, Jude, MD, 5 mg at 02/27/19 1023 .  aspirin chewable tablet 81 mg, 81 mg, Oral, Daily, Ojie, Jude, MD, 81 mg at 02/27/19 1023 .  atorvastatin (LIPITOR) tablet 80 mg, 80 mg, Oral, Daily, Ojie, Jude, MD, 80 mg at 02/27/19 1024 .  brimonidine (ALPHAGAN) 0.15 % ophthalmic solution 1 drop, 1 drop, Both Eyes, TID, Ojie, Jude, MD, 1 drop at 02/27/19 1708 .  budesonide (PULMICORT) nebulizer solution 0.25 mg, 0.25 mg, Nebulization, BID, Dustin Flock, MD, 0.25 mg at 02/27/19 0836 .  Chlorhexidine Gluconate Cloth 2 %  PADS 6 each, 6 each, Topical, Q0600, Dustin Flock, MD .  cholecalciferol (VITAMIN D) tablet 1,000 Units, 1,000 Units, Oral, Daily, Ojie, Jude, MD, 1,000 Units at 02/27/19 1023 .  diphenhydrAMINE (BENADRYL) capsule 25 mg, 25 mg, Oral, QHS PRN, Lance Coon, MD, 25 mg at 02/24/19 2118 .  furosemide (LASIX) injection 20 mg, 20 mg, Intravenous, BID, Candiss Norse, Har[place], MD, 20 mg at 02/27/19 1658 .  guaiFENesin (MUCINEX) 12 hr tablet 600 mg, 600 mg, Oral, BID, Dustin Flock, MD, 600 mg at 02/27/19 1024 .  ipratropium-albuterol (DUONEB) 0.5-2.5 (3) MG/3ML nebulizer solution 3 mL, 3 mL, Nebulization, Q4H, Dustin Flock, MD, 3 mL at 02/27/19 1557 .  latanoprost (XALATAN) 0.005 % ophthalmic solution 1 drop, 1 drop, Both Eyes, QHS, Ojie, Jude, MD, 1 drop at 02/26/19 2203 .  magnesium oxide (MAG-OX) tablet 400 mg, 400 mg, Oral,  BID, Ojie, Jude, MD, 400 mg at 02/27/19 1024 .  methylPREDNISolone sodium succinate (SOLU-MEDROL) 125 mg/2 mL injection 60 mg, 60 mg, Intravenous, Q6H, Dustin Flock, MD, 60 mg at 02/27/19 1701 .  metoprolol succinate (TOPROL-XL) 24 hr tablet 200 mg, 200 mg, Oral, Daily, Ojie, Jude, MD, 200 mg at 02/27/19 1024 .  mupirocin ointment (BACTROBAN) 2 % 1 application, 1 application, Nasal, BID, Dustin Flock, MD, 1 application at 19/50/93 1305 .  neomycin-bacitracin-polymyxin (NEOSPORIN) ointment, , Topical, BID, Dustin Flock, MD .  nitroGLYCERIN (NITROSTAT) SL tablet 0.4 mg, 0.4 mg, Sublingual, Q5 min PRN, Ojie, Jude, MD .  ondansetron (ZOFRAN) injection 4 mg, 4 mg, Intravenous, Q6H PRN, Ojie, Jude, MD .  sodium chloride flush (NS) 0.9 % injection 3 mL, 3 mL, Intravenous, Q12H, Ojie, Jude, MD, 3 mL at 02/27/19 1029 .  sodium chloride flush (NS) 0.9 % injection 3 mL, 3 mL, Intravenous, PRN, Ojie, Jude, MD .  tiotropium (SPIRIVA) inhalation capsule ([facility] use ONLY) 18 mcg, 18 mcg, Inhalation, Daily, Ojie, Jude, MD, 18 mcg at 02/27/19 1028 .  vitamin B-12 (CYANOCOBALAMIN) tablet 1,000 mcg, 1,000 mcg, Oral, Daily, Dustin Flock, MD, 1,000 mcg at 02/27/19 1709    ALLERGIES   Patient has no known allergies.     REVIEW OF SYSTEMS    Review of Systems:  Gen:  Denies  fever, sweats, chills weigh loss  HEENT: Denies blurred vision, double vision, ear pain, eye pain, hearing loss, nose bleeds, sore throat Cardiac:  No dizziness, chest pain or heaviness, chest tightness,edema Resp:   Denies cough or sputum porduction, shortness of breath,wheezing, hemoptysis,  Gi: Denies swallowing difficulty, stomach pain, nausea or vomiting, diarrhea, constipation, bowel incontinence Gu:  Denies bladder incontinence, burning urine Ext:   Denies Joint pain, stiffness or swelling Skin: Denies  skin rash, easy bruising or bleeding or hives Endoc:  Denies polyuria, polydipsia , polyphagia or weight change  Psych:   Denies depression, insomnia or hallucinations   Other:  All other systems negative   VS: BP (!) 119/57 (BP Location: Left Arm)   Pulse 62   Temp 97.9 F (36.6 C) (Oral)   Resp 18   Ht 5\' 11"  (1.803 m)   Wt 98.7 kg   SpO2 96%   BMI 30.35 kg/m      PHYSICAL EXAM    GENERAL:NAD, no fevers, chills, no weakness no fatigue HEAD: Normocephalic, atraumatic.  EYES: Pupils equal, round, reactive to light. Extraocular muscles intact. No scleral icterus.  MOUTH: Moist mucosal membrane. Dentition intact. No abscess noted.  EAR, NOSE, THROAT: Clear without exudates. No external lesions.  NECK: Supple. No thyromegaly. No  nodules. No JVD.  PULMONARY:mild crackles at bases CARDIOVASCULAR: S1 and S2. Regular rate and rhythm. No murmurs, rubs, or gallops. No edema. Pedal pulses 2+ bilaterally.  GASTROINTESTINAL: Soft, nontender, nondistended. No masses. Positive bowel sounds. No hepatosplenomegaly.  MUSCULOSKELETAL: No swelling, clubbing, or edema. Range of motion full in all extremities.  NEUROLOGIC: Cranial nerves II through XII are intact. No gross focal neurological deficits. Sensation intact. Reflexes intact.  SKIN: No ulceration, lesions, rashes, or cyanosis. Skin warm and dry. Turgor intact.  PSYCHIATRIC: Mood, affect within normal limits. The patient is awake, alert and oriented x 3. Insight, judgment intact.       IMAGING    Dg Chest Port 1 View  Result Date: 02/23/2019 CLINICAL DATA:  Shortness of breath EXAM: PORTABLE CHEST 1 VIEW COMPARISON:  Jan 28, 2019 and January 09, 2019 FINDINGS: There is interstitial edema throughout the lungs, essentially stable. There are small pleural effusions bilaterally. There is no frank airspace consolidation. There is cardiomegaly with pulmonary venous hypertension. Pacemaker leads are attached to the right atrium and right ventricle. There is aortic atherosclerosis. No adenopathy. No bone lesions. IMPRESSION: Pulmonary vascular congestion  with small pleural effusions and interstitial edema. Appearance is felt to be indicative of a degree of congestive heart failure. There may well be superimposed noncardiogenic interstitial pneumonitis, although there is no frank airspace consolidation. Pacemaker leads attached to right atrium and right ventricle. Aortic Atherosclerosis (ICD10-I70.0). Electronically Signed   By: Lowella Grip III M.D.   On: 02/23/2019 13:25      ASSESSMENT/PLAN      Acute on chronic hypoxemic respiratory failure due to Acute on chronic systolic and diastolic decompensated CHF with EF 50 to 31% complicated by advanced COPD with chronic hypoxemia, severe anemia and possible pulmonary hypertension suggested by severely elevated RVSP most recent echo -Agree with diuresis -Cardiology on case appreciate input -Strict I's and O's -Fluid intake restirction [id number] Complicated by CKD - renal team on case - appreciate input    Advanced COPD -continue COPD carepath - DUonebs,  -IS at bedsie -Acapella     Thank you for allowing me to participate in the care of this patient.     Patient/Family are satisfied with care plan and all questions have been answered.  This document was prepared using Dragon voice recognition software and may include unintentional dictation errors.     Ottie Glazier, M.D.  Division of [place]

## 2019-02-27 NOTE — Progress Notes (Signed)
[place] at [facility]                                                                                                                                                                                  Patient Demographics   Darius Taylor, is a 73 y.o. male, DOB - [birth year], IOE:[id number]  Admit date - 02/23/2019   Admitting Physician Jude Stark Jock, MD  Outpatient Primary MD for the patient is [facility], [place]   LOS - 4  Subjective: Patient doing better on 5 L oxygen currently   Review of Systems:   CONSTITUTIONAL: No documented fever. No fatigue, weakness. No weight gain, no weight loss.  EYES: No blurry or double vision.  ENT: No tinnitus. No postnasal drip. No redness of the oropharynx.  RESPIRATORY: No cough, positive wheeze, no hemoptysis.  Positive dyspnea.  CARDIOVASCULAR: No chest pain. No orthopnea. No palpitations. No syncope.  GASTROINTESTINAL: No nausea, no vomiting or diarrhea. No abdominal pain. No melena or hematochezia.  GENITOURINARY: No dysuria or hematuria.  ENDOCRINE: No polyuria or nocturia. No heat or cold intolerance.  HEMATOLOGY: No anemia. No bruising. No bleeding.  INTEGUMENTARY: No rashes. No lesions.  MUSCULOSKELETAL: No arthritis. No swelling. No gout.  NEUROLOGIC: No numbness, tingling, or ataxia. No seizure-type activity.  PSYCHIATRIC: No anxiety. No insomnia. No ADD.    Vitals:   Vitals:   02/27/19 0057 02/27/19 0347 02/27/19 0439 02/27/19 0746  BP:   (!) 128/47 111/74  Pulse: 60  63 61  Resp:   14 18  Temp:   97.8 F (36.6 C) 97.8 F (36.6 C)  TempSrc:   Oral Oral  SpO2: 100% 97% 93% 96%  Weight:   98.7 kg   Height:        Wt Readings from Last 3 Encounters:  02/27/19 98.7 kg  02/05/19 96.2 kg  02/04/19 96.3 kg     Intake/Output Summary (Last 24 hours) at 02/27/2019 1515 Last data filed at 02/27/2019 1100 Gross per 24 hour  Intake 240 ml  Output 585 ml  Net -345 ml    Physical Exam:    GENERAL: Pleasant-appearing in no apparent distress.  HEAD, EYES, EARS, NOSE AND THROAT: Atraumatic, normocephalic. Extraocular muscles are intact. Pupils equal and reactive to light. Sclerae anicteric. No conjunctival injection. No oro-pharyngeal erythema.  NECK: Supple. There is no jugular venous distention. No bruits, no lymphadenopathy, no thyromegaly.  HEART: Regular rate and rhythm,. No murmurs, no rubs, no clicks.  LUNGS: Bilateral wheezing improving both lungs  aBDOMEN: Soft, flat, nontender, nondistended. Has good bowel sounds. No hepatosplenomegaly appreciated.  EXTREMITIES: No evidence of any cyanosis, clubbing, or peripheral edema.  +  2 pedal and radial pulses bilaterally.  NEUROLOGIC: The patient is alert, awake, and oriented x3 with no focal motor or sensory deficits appreciated bilaterally.  SKIN: Moist and warm with no rashes appreciated.  Thigh area with warmth and redness Psych: Not anxious, depressed LN: No inguinal LN enlargement    Antibiotics   Anti-infectives (From admission, onward)   Start     Dose/Rate Route Frequency Ordered Stop   02/27/19 1215  Ampicillin-Sulbactam (UNASYN) 3 g in sodium chloride 0.9 % 100 mL IVPB     3 g 200 mL/hr over 30 Minutes Intravenous 4 times daily 02/27/19 1203     02/25/19 1200  amoxicillin-clavulanate (AUGMENTIN) 875-125 MG per tablet 1 tablet  Status:  Discontinued     1 tablet Oral Every 12 hours 02/25/19 1159 02/27/19 1149      Medications   Scheduled Meds: . amiodarone  200 mg Oral Daily  . apixaban  5 mg Oral BID  . aspirin  81 mg Oral Daily  . atorvastatin  80 mg Oral Daily  . brimonidine  1 drop Both Eyes TID  . budesonide (PULMICORT) nebulizer solution  0.25 mg Nebulization BID  . Chlorhexidine Gluconate Cloth  6 each Topical Q0600  . cholecalciferol  1,000 Units Oral Daily  . furosemide  20 mg Intravenous BID  . guaiFENesin  600 mg Oral BID  . ipratropium-albuterol  3 mL Nebulization Q4H  . latanoprost  1 drop  Both Eyes QHS  . magnesium oxide  400 mg Oral BID  . methylPREDNISolone (SOLU-MEDROL) injection  60 mg Intravenous Q6H  . metoprolol  200 mg Oral Daily  . mupirocin ointment  1 application Nasal BID  . neomycin-bacitracin-polymyxin   Topical BID  . sodium chloride flush  3 mL Intravenous Q12H  . tiotropium  18 mcg Inhalation Daily   Continuous Infusions: . sodium chloride 250 mL (02/27/19 1301)  . ampicillin-sulbactam (UNASYN) IV 3 g (02/27/19 1304)   PRN Meds:.sodium chloride, acetaminophen, diphenhydrAMINE, nitroGLYCERIN, ondansetron (ZOFRAN) IV, sodium chloride flush   Data Review:   Micro Results Recent Results (from the past 240 hour(s))  SARS Coronavirus 2 (CEPHEID- Performed in [place] hospital lab), Hosp Order     Status: None   Collection Time: 02/23/19  1:05 PM  Result Value Ref Range Status   SARS Coronavirus 2 NEGATIVE NEGATIVE Final    Comment: (NOTE) If result is NEGATIVE SARS-CoV-2 target nucleic acids are NOT DETECTED. The SARS-CoV-2 RNA is generally detectable in upper and lower  respiratory specimens during the acute phase of infection. The lowest  concentration of SARS-CoV-2 viral copies this assay can detect is 250  copies / mL. A negative result does not preclude SARS-CoV-2 infection  and should not be used as the sole basis for treatment or other  patient management decisions.  A negative result may occur with  improper specimen collection / handling, submission of specimen other  than nasopharyngeal swab, presence of viral mutation(s) within the  areas targeted by this assay, and inadequate number of viral copies  (<250 copies / mL). A negative result must be combined with clinical  observations, patient history, and epidemiological information. If result is POSITIVE SARS-CoV-2 target nucleic acids are DETECTED. The SARS-CoV-2 RNA is generally detectable in upper and lower  respiratory specimens dur ing the acute phase of infection.  Positive   results are indicative of active infection with SARS-CoV-2.  Clinical  correlation with patient history and other diagnostic information is  necessary to determine patient  infection status.  Positive results do  not rule out bacterial infection or co-infection with other viruses. If result is PRESUMPTIVE POSTIVE SARS-CoV-2 nucleic acids MAY BE PRESENT.   A presumptive positive result was obtained on the submitted specimen  and confirmed on repeat testing.  While 2019 novel coronavirus  (SARS-CoV-2) nucleic acids may be present in the submitted sample  additional confirmatory testing may be necessary for epidemiological  and / or clinical management purposes  to differentiate between  SARS-CoV-2 and other Sarbecovirus currently known to infect humans.  If clinically indicated additional testing with an alternate test  methodology [id number]) is advised. The SARS-CoV-2 RNA is generally  detectable in upper and lower respiratory sp ecimens during the acute  phase of infection. The expected result is Negative. Fact Sheet for Patients:  StrictlyIdeas.no Fact Sheet for Healthcare Providers: BankingDealers.co.za This test is not yet approved or cleared by the [place] FDA and has been authorized for detection and/or diagnosis of SARS-CoV-2 by FDA under an Emergency Use Authorization (EUA).  This EUA will remain in effect (meaning this test can be used) for the duration of the COVID-19 declaration under Section 564(b)(1) of the Act, 21 U.S.C. section 360bbb-3(b)(1), unless the authorization is terminated or revoked sooner. Performed at [facility], [address] [place], [place] [address]   Blood culture (routine x 2)     Status: None (Preliminary result)   Collection Time: 02/23/19  1:05 PM  Result Value Ref Range Status   Specimen Description BLOOD LEFT ANTECUBITAL  Final   Special Requests   Final    BOTTLES DRAWN AEROBIC  ONLY Blood Culture results may not be optimal due to an inadequate volume of blood received in culture bottles   Culture   Final    NO GROWTH 4 DAYS Performed at [facility], [address], [place], [place] [address]    Report Status PENDING  Incomplete  Blood culture (routine x 2)     Status: None (Preliminary result)   Collection Time: 02/23/19  1:05 PM  Result Value Ref Range Status   Specimen Description BLOOD RIGHT ANTECUBITAL  Final   Special Requests   Final    BOTTLES DRAWN AEROBIC AND ANAEROBIC Blood Culture adequate volume   Culture   Final    NO GROWTH 4 DAYS Performed at [facility], [address], [place], [place] [address]    Report Status PENDING  Incomplete  Aerobic Culture (superficial specimen)     Status: None (Preliminary result)   Collection Time: 02/27/19 10:41 AM  Result Value Ref Range Status   Specimen Description   Final    WOUND LEFT THIGH Performed at [facility], [place] [address], [place], [place] [address]    Special Requests   Final    NONE Performed at [facility], [address] [place], [place] [address]    Gram Stain   Final    NO WBC SEEN NO ORGANISMS SEEN Performed at [facility], [place] [address], [place], [place] [address]    Culture PENDING  Incomplete   Report Status PENDING  Incomplete    Radiology Reports Dg Chest Port 1 View  Result Date: 02/23/2019 CLINICAL DATA:  Shortness of breath EXAM: PORTABLE CHEST 1 VIEW COMPARISON:  Jan 28, 2019 and January 09, 2019 FINDINGS: There is interstitial edema throughout the lungs, essentially stable. There are small pleural effusions bilaterally. There is no frank airspace consolidation. There is cardiomegaly with pulmonary  venous hypertension. Pacemaker leads are attached to the right atrium and right ventricle. There is aortic atherosclerosis. No adenopathy. No bone lesions. IMPRESSION: Pulmonary vascular congestion with small pleural effusions and  interstitial edema. Appearance is felt to be indicative of a degree of congestive heart failure. There may well be superimposed noncardiogenic interstitial pneumonitis, although there is no frank airspace consolidation. Pacemaker leads attached to right atrium and right ventricle. Aortic Atherosclerosis (ICD10-I70.0). Electronically Signed   By: Lowella Grip III M.D.   On: 02/23/2019 13:25     CBC Recent Labs  Lab 02/23/19 1305 02/24/19 0459 02/25/19 0225 02/27/19 0509  WBC 10.9* 10.6* 10.8* 22.0*  HGB 8.4* 8.1* 7.6* 8.1*  HCT 28.0* 27.3* 27.0* 27.0*  PLT 293 290 301 358  MCV 97.2 97.5 101.5* 97.1  MCH 29.2 28.9 28.6 29.1  MCHC 30.0 29.7* 28.1* 30.0  RDW 15.9* 15.9* 16.3* 16.2*    Chemistries  Recent Labs  Lab 02/23/19 1305 02/24/19 0459 02/25/19 0225 02/26/19 0632 02/27/19 0509  NA 138 141 140 139 137  K 4.0 4.1 4.7 4.4 5.0  CL 101 102 102 100 102  CO2 28 29 30 28 23   GLUCOSE 161* 116* 106* 191* 179*  BUN 44* 42* 48* 62* 90*  CREATININE 1.79* 1.63* 1.65* 1.92* 2.40*  CALCIUM 8.6* 8.7* 8.8* 8.8* 8.6*  MG  --  2.4  --   --   --   AST 24  --   --   --   --   ALT 22  --   --   --   --   ALKPHOS 82  --   --   --   --   BILITOT 1.1  --   --   --   --    ------------------------------------------------------------------------------------------------------------------ estimated creatinine clearance is 32.4 mL/min (A) (by C-G formula based on SCr of 2.4 mg/dL (H)). ------------------------------------------------------------------------------------------------------------------ No results for input(s): HGBA1C in the last 72 hours. ------------------------------------------------------------------------------------------------------------------ No results for input(s): CHOL, HDL, LDLCALC, TRIG, CHOLHDL, LDLDIRECT in the last 72 hours. ------------------------------------------------------------------------------------------------------------------ No results for input(s):  TSH, T4TOTAL, T3FREE, THYROIDAB in the last 72 hours.  Invalid input(s): FREET3 ------------------------------------------------------------------------------------------------------------------ Recent Labs    02/26/19 1427  VITAMINB12 185  FOLATE 11.7  FERRITIN 120  TIBC 395  IRON 39*  RETICCTPCT 4.7*    Coagulation profile No results for input(s): INR, PROTIME in the last 168 hours.  No results for input(s): DDIMER in the last 72 hours.  Cardiac Enzymes Recent Labs  Lab 02/23/19 1305 02/23/19 1613 02/23/19 1942  TROPONINI 0.08* 0.08* 0.08*   ------------------------------------------------------------------------------------------------------------------ Invalid input(s): POCBNP    Assessment & Plan   Patient is a 73 year old male with history of chronic atrial fibrillation on anticoagulation with Eliquis, chronic kidney disease stage III, COPD, prior history of AICD, chronic diastolic CHF and prior history of asthma admitted with acute hypoxic respiratory failure secondary to CHF exacerbation.  1.  Acute hypoxic respiratory failure secondary to CHF exacerbation. Creatinine increasing change to oral Lasix Continue nebs and steroid  2.  Acute on chronic diastolic CHF exacerbation.  Due to creatinine increasing Lasix on hold Patient had recent 2D echocardiogram in February 2020 with ejection fraction of 50 to 55%.  No indication to repeat at this time.    Continue on losartan.  Continue Toprol-XL. Cardiology has seen the patient  3.  Cellulitis in his thigh we will treat with Unasyn Leukocytosis likely due to steroid  4.  Chronic atrial fibrillation Rate controlled.  Continue  anticoagulation with Eliquis  5.    Acute chronic kidney disease stage III Creatinine worse I have asked nephrology to see hold Lasix  5.  Mildly elevated troponin of 0.08 Stable.  Most likely due to supply demand ischemia from CHF.  6.  Anemia chronic with B12 being borderline low I  will give B12 replacement     Code Status Orders  (From admission, onward)         Start     Ordered   02/23/19 1507  Full code  Continuous     02/23/19 1509        Code Status History    Date Active Date Inactive Code Status Order ID Comments User Context   01/25/2019 1855 01/30/2019 1905 Full Code [id number]  Otila Back, MD ED   12/30/2018 0224 01/21/2019 1931 Full Code [id number]  Lance Coon, MD Inpatient   11/03/2018 2032 11/12/2018 2045 Full Code [id number]  Gladstone Lighter, MD Inpatient   08/30/2018 1045 09/02/2018 1616 Full Code [id number]  Fritzi Mandes, MD Inpatient   08/22/2018 1435 08/25/2018 2257 Full Code [id number]  Fritzi Mandes, MD Inpatient   01/08/2018 1523 01/11/2018 1712 Full Code [id number]  Nicholes Mango, MD ED   11/18/2017 2227 11/23/2017 2033 Full Code [id number]  Vaughan Basta, MD Inpatient   10/21/2016 1216 10/26/2016 2247 Full Code [id number]  Bettey Costa, MD Inpatient           Consults cardiology  DVT Prophylaxis Eliquis  Lab Results  Component Value Date   PLT 358 02/27/2019     Time Spent in minutes 35 minutes greater than 50% of time spent in care coordination and counseling patient regarding the condition and plan of care.   Dustin Flock M.D on 02/27/2019 at 3:15 PM  Between 7am to 6pm - Pager - [phone]  After 6pm go to [email or web address] - Proofreader  [employer]   Office  [phone]

## 2019-02-28 LAB — CULTURE, BLOOD (ROUTINE X 2)
Culture: NO GROWTH
Culture: NO GROWTH
Special Requests: ADEQUATE

## 2019-02-28 LAB — BASIC METABOLIC PANEL
Anion gap: 13 (ref 5–15)
BUN: 109 mg/dL — ABNORMAL HIGH (ref 8–23)
CO2: 24 mmol/L (ref 22–32)
Calcium: 8.2 mg/dL — ABNORMAL LOW (ref 8.9–10.3)
Chloride: 99 mmol/L (ref 98–111)
Creatinine, Ser: 2.5 mg/dL — ABNORMAL HIGH (ref 0.61–1.24)
GFR calc Af Amer: 28 mL/min — ABNORMAL LOW (ref >60)
GFR calc non Af Amer: 24 mL/min — ABNORMAL LOW (ref >60)
Glucose, Bld: 211 mg/dL — ABNORMAL HIGH (ref 70–99)
Potassium: 5.1 mmol/L (ref 3.5–5.1)
Sodium: 136 mmol/L (ref 135–145)

## 2019-02-28 LAB — CBC
HCT: 25.9 % — ABNORMAL LOW (ref 39.0–52.0)
Hemoglobin: 7.8 g/dL — ABNORMAL LOW (ref 13.0–17.0)
MCH: 28.8 pg (ref 26.0–34.0)
MCHC: 30.1 g/dL (ref 30.0–36.0)
MCV: 95.6 fL (ref 80.0–100.0)
Platelets: 360 10*3/uL (ref 150–400)
RBC: 2.71 MIL/uL — ABNORMAL LOW (ref 4.22–5.81)
RDW: 16.1 % — ABNORMAL HIGH (ref 11.5–15.5)
WBC: 18 10*3/uL — ABNORMAL HIGH (ref 4.0–10.5)
nRBC: 0 % (ref 0.0–0.2)

## 2019-02-28 MED ORDER — CYCLOBENZAPRINE HCL 10 MG PO TABS
10.0000 mg | ORAL_TABLET | Freq: Once | ORAL | Status: AC
  Administered 2019-02-28: 10 mg via ORAL
  Filled 2019-02-28: qty 1

## 2019-02-28 MED ORDER — HYDROCODONE-ACETAMINOPHEN 5-325 MG PO TABS: 1.0000 | ORAL_TABLET | ORAL | Status: AC

## 2019-02-28 MED ORDER — IPRATROPIUM-ALBUTEROL 0.5-2.5 (3) MG/3ML IN SOLN
3.0000 mL | RESPIRATORY_TRACT | Status: DC | PRN
  Administered 2019-03-01: 3 mL via RESPIRATORY_TRACT
  Filled 2019-02-28: qty 3

## 2019-02-28 MED ORDER — IPRATROPIUM-ALBUTEROL 0.5-2.5 (3) MG/3ML IN SOLN
3.0000 mL | Freq: Three times a day (TID) | RESPIRATORY_TRACT | Status: DC
  Administered 2019-03-01 – 2019-03-05 (×14): 3 mL via RESPIRATORY_TRACT
  Filled 2019-02-28 (×15): qty 3

## 2019-02-28 NOTE — Progress Notes (Signed)
[facility], [place] 02/28/19  Subjective:   Patient is doing well Urine output 1400 cc Feels like his lower extremity edema is improving although not resolved Breathing is more comfortable but still requiring oxygen by nasal cannula Serum creatinine slightly higher at 2.50  Objective:  Vital signs in last 24 hours:  Temp:  [97.5 F (36.4 C)-98.2 F (36.8 C)] 97.9 F (36.6 C) (06/06 0812) Pulse Rate:  [62-66] 66 (06/06 0812) Resp:  [15-20] 18 (06/06 0812) BP: (92-126)/(57-68) 118/68 (06/06 0812) SpO2:  [93 %-98 %] 93 % (06/06 0812) Weight:  [99.6 kg] 99.6 kg (06/06 0312)  Weight change: 0.862 kg Filed Weights   02/26/19 0421 02/27/19 0439 02/28/19 0312  Weight: 97.3 kg 98.7 kg 99.6 kg    Intake/Output:    Intake/Output Summary (Last 24 hours) at 02/28/2019 1531 Last data filed at 02/28/2019 1353 Gross per 24 hour  Intake 360 ml  Output 1200 ml  Net -840 ml   Physical Exam: General:  Elderly gentleman, laying in the bed  HEENT  anicteric, moist oral mucous membranes  Neck:  Supple, no masses  Lungs:  Mild bilateral crackles, oxygen by nasal cannula  Heart::  Systolic murmur, no rub  Abdomen:  Obese, soft, nontender  Extremities:  + Edema bilaterally  Neurologic:  Alert, oriented  Skin:  No acute rashes       Basic Metabolic Panel:  Recent Labs  Lab 02/24/19 0459 02/25/19 0225 02/26/19 0632 02/27/19 0509 02/28/19 0558  NA 141 140 139 137 136  K 4.1 4.7 4.4 5.0 5.1  CL 102 102 100 102 99  CO2 29 30 28 23 24   GLUCOSE 116* 106* 191* 179* 211*  BUN 42* 48* 62* 90* 109*  CREATININE 1.63* 1.65* 1.92* 2.40* 2.50*  CALCIUM 8.7* 8.8* 8.8* 8.6* 8.2*  MG 2.4  --   --   --   --      CBC: Recent Labs  Lab 02/23/19 1305 02/24/19 0459 02/25/19 0225 02/27/19 0509 02/28/19 0558  WBC 10.9* 10.6* 10.8* 22.0* 18.0*  HGB 8.4* 8.1* 7.6* 8.1* 7.8*  HCT 28.0* 27.3* 27.0* 27.0* 25.9*  MCV 97.2 97.5 101.5* 97.1 95.6  PLT 293 290 301  358 360     No results found for: HEPBSAG, HEPBSAB, HEPBIGM    Microbiology:  Recent Results (from the past 240 hour(s))  SARS Coronavirus 2 (CEPHEID- Performed in [facility] hospital lab), Hosp Order     Status: None   Collection Time: 02/23/19  1:05 PM  Result Value Ref Range Status   SARS Coronavirus 2 NEGATIVE NEGATIVE Final    Comment: (NOTE) If result is NEGATIVE SARS-CoV-2 target nucleic acids are NOT DETECTED. The SARS-CoV-2 RNA is generally detectable in upper and lower  respiratory specimens during the acute phase of infection. The lowest  concentration of SARS-CoV-2 viral copies this assay can detect is 250  copies / mL. A negative result does not preclude SARS-CoV-2 infection  and should not be used as the sole basis for treatment or other  patient management decisions.  A negative result may occur with  improper specimen collection / handling, submission of specimen other  than nasopharyngeal swab, presence of viral mutation(s) within the  areas targeted by this assay, and inadequate number of viral copies  (<250 copies / mL). A negative result must be combined with clinical  observations, patient history, and epidemiological information. If result is POSITIVE SARS-CoV-2 target nucleic acids are DETECTED. The SARS-CoV-2 RNA is generally  detectable in upper and lower  respiratory specimens dur ing the acute phase of infection.  Positive  results are indicative of active infection with SARS-CoV-2.  Clinical  correlation with patient history and other diagnostic information is  necessary to determine patient infection status.  Positive results do  not rule out bacterial infection or co-infection with other viruses. If result is PRESUMPTIVE POSTIVE SARS-CoV-2 nucleic acids MAY BE PRESENT.   A presumptive positive result was obtained on the submitted specimen  and confirmed on repeat testing.  While 2019 novel coronavirus  (SARS-CoV-2) nucleic acids may be present in  the submitted sample  additional confirmatory testing may be necessary for epidemiological  and / or clinical management purposes  to differentiate between  SARS-CoV-2 and other Sarbecovirus currently known to infect humans.  If clinically indicated additional testing with an alternate test  methodology [phone]) is advised. The SARS-CoV-2 RNA is generally  detectable in upper and lower respiratory sp ecimens during the acute  phase of infection. The expected result is Negative. Fact Sheet for Patients:  StrictlyIdeas.no Fact Sheet for Healthcare Providers: BankingDealers.co.za This test is not yet approved or cleared by the [place] FDA and has been authorized for detection and/or diagnosis of SARS-CoV-2 by FDA under an Emergency Use Authorization (EUA).  This EUA will remain in effect (meaning this test can be used) for the duration of the COVID-19 declaration under Section 564(b)(1) of the Act, 21 U.S.C. section 360bbb-3(b)(1), unless the authorization is terminated or revoked sooner. Performed at [facility], [address] [name], [place] [address]   Blood culture (routine x 2)     Status: None   Collection Time: 02/23/19  1:05 PM  Result Value Ref Range Status   Specimen Description BLOOD LEFT ANTECUBITAL  Final   Special Requests   Final    BOTTLES DRAWN AEROBIC ONLY Blood Culture results may not be optimal due to an inadequate volume of blood received in culture bottles   Culture   Final    NO GROWTH 5 DAYS Performed at [facility], [address] [place], [place] [address]    Report Status 02/28/2019 FINAL  Final  Blood culture (routine x 2)     Status: None   Collection Time: 02/23/19  1:05 PM  Result Value Ref Range Status   Specimen Description BLOOD RIGHT ANTECUBITAL  Final   Special Requests   Final    BOTTLES DRAWN AEROBIC AND ANAEROBIC Blood Culture adequate volume   Culture   Final     NO GROWTH 5 DAYS Performed at [facility], [address], [place], [place] [address]    Report Status 02/28/2019 FINAL  Final  Aerobic Culture (superficial specimen)     Status: None (Preliminary result)   Collection Time: 02/27/19 10:41 AM  Result Value Ref Range Status   Specimen Description   Final    WOUND LEFT THIGH Performed at [facility], [place] [address], [place], [place] [address]    Special Requests   Final    NONE Performed at [facility], [place], [place] [address]    Gram Stain NO WBC SEEN NO ORGANISMS SEEN   Final   Culture   Final    NO GROWTH < 24 HOURS Performed at [facility], [place] [address] [place], [place] [address]    Report Status PENDING  Incomplete    Coagulation Studies: No results for input(s): LABPROT, INR in the last  72 hours.  Urinalysis: No results for input(s): COLORURINE, LABSPEC, PHURINE, GLUCOSEU, HGBUR, BILIRUBINUR, KETONESUR, PROTEINUR, UROBILINOGEN, NITRITE, LEUKOCYTESUR in the last 72 hours.  Invalid input(s): APPERANCEUR    Imaging: No results found.   Medications:   . sodium chloride 250 mL (02/28/19 1248)  . ampicillin-sulbactam (UNASYN) IV 3 g (02/28/19 1244)   . amiodarone  200 mg Oral Daily  . apixaban  5 mg Oral BID  . aspirin  81 mg Oral Daily  . atorvastatin  80 mg Oral Daily  . brimonidine  1 drop Both Eyes TID  . budesonide (PULMICORT) nebulizer solution  0.25 mg Nebulization BID  . Chlorhexidine Gluconate Cloth  6 each Topical Q0600  . cholecalciferol  1,000 Units Oral Daily  . furosemide  20 mg Intravenous BID  . guaiFENesin  600 mg Oral BID  . HYDROcodone-acetaminophen  1 tablet Oral NOW  . ipratropium-albuterol  3 mL Nebulization TID  . latanoprost  1 drop Both Eyes QHS  . magnesium oxide  400 mg Oral BID  . methylPREDNISolone (SOLU-MEDROL) injection  60 mg Intravenous Q6H  . metoprolol  200 mg Oral Daily  . mupirocin ointment  1 application Nasal BID   . neomycin-bacitracin-polymyxin   Topical BID  . sodium chloride flush  3 mL Intravenous Q12H  . tiotropium  18 mcg Inhalation Daily  . vitamin B-12  1,000 mcg Oral Daily   sodium chloride, acetaminophen, diphenhydrAMINE, ipratropium-albuterol, nitroGLYCERIN, ondansetron (ZOFRAN) IV, sodium chloride flush  Assessment/ Plan:  73 y.o. African-American male with medical problems of coronary disease, history of PCI 2013, nonischemic dilated cardiomyopathy, AICD 2006, aortic stenosis, Taylor 2018, atrial fibrillation on Eliquis, CKD stage III, COPD with 3 L supplemental oxygen, was admitted on 02/23/2019 with worsening shortness of breath and lower extremity edema.   1.  Acute kidney injury on chronic kidney disease stage III Baseline creatinine 1.65/GFR 47 at the time of admission on July 3 Creatinine increased to 1.9 and then to 2.40 today with diuresis.   Rate of rise of creatinine has slowed down.  Slight increase in creatinine noted to 2.50 Urine output is acceptable.  Continue to monitor closely  2.  Shortness of breath and volume overload with lower extremity edema Plan to resume Lasix at a lower dose of 20 mg twice a day Will consider changing to oral torsemide on Monday Avoid hypotension May need to reduce the dose of metoprolol if possible to keep blood pressure slightly high for adequate renal perfusion   LOS: [place]ana 6/6/20203:31 PM  [place], [place]  Note: This note was prepared with Dragon dictation. Any transcription errors are unintentional

## 2019-02-28 NOTE — Progress Notes (Signed)
[place] [facility] NAME: Darius Taylor    MR#:  [id number]  DATE OF BIRTH:  1945/11/09  SUBJECTIVE:   Chief Complaint  Patient presents with  . Shortness of Breath   Patient seen at the bedside. He is found sitting in his recliner in no acute distress. Patient report that the is feeling much better today with improvement in edema and work of breathing. Still requiring supplemental oxygen.  REVIEW OF SYSTEMS:  Review of Systems  Constitutional: Negative for chills, fever, malaise/fatigue and weight loss.  HENT: Negative for congestion, hearing loss and sore throat.   Eyes: Negative for blurred vision and double vision.  Respiratory: Positive for shortness of breath. Negative for cough and wheezing.   Cardiovascular: Positive for leg swelling. Negative for chest pain, palpitations and orthopnea.  Gastrointestinal: Negative for abdominal pain, diarrhea, nausea and vomiting.  Genitourinary: Negative for dysuria and urgency.  Musculoskeletal: Negative for myalgias.  Skin: Negative for rash.  Neurological: Negative for dizziness, sensory change, speech change, focal weakness and headaches.  Psychiatric/Behavioral: Negative for depression.   DRUG ALLERGIES:  No Known Allergies VITALS:  Blood pressure 118/68, pulse 66, temperature 97.9 F (36.6 C), resp. rate 18, height 5\' 11"  (1.803 m), weight 99.6 kg, SpO2 93 %. PHYSICAL EXAMINATION:   GENERAL:  73 y.o.-year-old patient lying in the bed with no acute distress.  EYES: Pupils equal, round, reactive to light and accommodation. No scleral icterus. Extraocular muscles intact.  HEENT: Head atraumatic, normocephalic. Oropharynx and nasopharynx clear.  NECK:  Supple, no jugular venous distention. No thyroid enlargement, no tenderness.  LUNGS: Normal breath sounds bilaterally, no wheezing, rales,rhonchi or crepitation. No use of accessory muscles of respiration.  CARDIOVASCULAR: S1, S2 normal.  No murmurs, rubs, or gallops.  ABDOMEN: Soft, nontender, nondistended. Bowel sounds present. No organomegaly or mass.  EXTREMITIES: No pedal edema, cyanosis, or clubbing. No rash or lesions. + pedal pulses MUSCULOSKELETAL: Normal bulk, and power was 5+ grip and elbow, knee, and ankle flexion and extension bilaterally.  NEUROLOGIC:Alert and oriented x 3. CN 2-12 intact. Sensation to light touch and cold stimuli intact bilaterally. Finger to nose nl. Babinski is downgoing. DTR's (biceps, patellar, and achilles) 2+ and symmetric throughout. Gait not tested due to safety concern. PSYCHIATRIC: The patient is alert and oriented x 3.  SKIN: Left thigh cellulitis as below     DATA REVIEWED:  LABORATORY PANEL:  Male CBC Recent Labs  Lab 02/28/19 0558  WBC 18.0*  HGB 7.8*  HCT 25.9*  PLT 360   ------------------------------------------------------------------------------------------------------------------ Chemistries  Recent Labs  Lab 02/23/19 1305 02/24/19 0459  02/28/19 0558  NA 138 141   < > 136  K 4.0 4.1   < > 5.1  CL 101 102   < > 99  CO2 28 29   < > 24  GLUCOSE 161* 116*   < > 211*  BUN 44* 42*   < > 109*  CREATININE 1.79* 1.63*   < > 2.50*  CALCIUM 8.6* 8.7*   < > 8.2*  MG  --  2.4  --   --   AST 24  --   --   --   ALT 22  --   --   --   ALKPHOS 82  --   --   --   BILITOT 1.1  --   --   --    < > = values in this interval not displayed.  RADIOLOGY:  No results found. ASSESSMENT AND PLAN:   73 y.o. male with history of chronic atrial fibrillation on anticoagulation with Eliquis,chronic kidney disease stage III,COPD,prior history of AICD,chronic diastolic CHF and prior history of asthma admitted with acute hypoxic respiratory failure secondary to CHF exacerbation.  1.Acute hypoxic respiratory failure secondary to CHF exacerbation. - Improved - Creatinine increasing changed to oral Lasix - Continue nebs and steroid  2.Acute on chronic diastolic CHF  exacerbation.  - Last Echo 10/2018 with  EF 50-55% - Continue Lorstan - Beta-Blockade: Metoprolol - Diuretics: Furosemide IV diureses >1L negative per day until approach euvolemia / worsening renal function. - Low salt diet  - Strict I&Os - Fluid restriction - Cardiology input appreciated  3.  Advanced COPD - Duo nebs every 6 - Supplemental oxygen as needed  4.  Left thigh Cellulitis - IV Unasyn - Leukocytosis likely due to steroid  5.Chronic atrial fibrillation -rate controlled on Toprol - Continue anticoagulation with Eliquis  6. Acute chronic kidney disease stage III -slight increase in creatinine noted with diuresis - Avoid nephrotoxins - Continue to monitor renal function - Nephrology following  7.Mildly elevated troponin of 0.08 -likely due to supply demand ischemia from CHF  8. Anemia, hemoglobin 8.1, hematocrit 27.3. During admission 01/2019, patient's hemoglobin was 7.4; he received 1 dose of IV Venofer, and I unit of PRBC. Otelia Sergeant studies suggested iron deficiency with no acute bleeding. Needs outpatient monitoring   All the records are reviewed and case discussed with Care Management/Social Worker. Management plans discussed with the patient, family and they are in agreement.  CODE STATUS: Full Code  TOTAL TIME TAKING CARE OF THIS PATIENT: 38 minutes.   More than 50% of the time was spent in counseling/coordination of care: YES  POSSIBLE D/C IN 1 DAYS, DEPENDING ON CLINICAL CONDITION.   on 02/28/2019 at 4:01 PM  This patient was staffed with Dr. Valetta Fuller, [facility] who personally evaluated patient, reviewed documentation and agreed with assessment and plan of care as above.  Rufina Falco, DNP, FNP-BC [facility] Nurse Practitioner   Between 7am to 6pm - Pager [phone]  After 6pm go to [email or web address] - Technical brewer [facility]  Office  [phone]  CC: Primary care physician; [employer]   Note: This dictation was prepared with Diplomatic Services operational officer dictation along with smaller Company secretary. Any transcriptional errors that result from this process are unintentional.

## 2019-02-28 NOTE — Progress Notes (Addendum)
Pt is complaining of muscle pain on back and shoulder. Notify prime. Will continue to monitor.  Update 0313: Talked to Gardiner Barefoot NP and ordered Flexeril 10 mg Oral once. Will continue to monitor.

## 2019-02-28 NOTE — Plan of Care (Signed)
  Problem: Education: Goal: Knowledge of General Education information will improve Description Including pain rating scale, medication(s)/side effects and non-pharmacologic comfort measures 02/28/2019 0519 by Liliane Channel, RN Outcome: Progressing 02/28/2019 0518 by Liliane Channel, RN Outcome: Progressing   Problem: Activity: Goal: Risk for activity intolerance will decrease Outcome: Progressing   Problem: Activity: Goal: Capacity to carry out activities will improve 02/28/2019 0519 by Liliane Channel, RN Outcome: Progressing 02/28/2019 0518 by Liliane Channel, RN Outcome: Progressing

## 2019-02-28 NOTE — Plan of Care (Signed)

## 2019-03-01 ENCOUNTER — Inpatient Hospital Stay: Payer: Medicare HMO

## 2019-03-01 LAB — ALBUMIN: Albumin: 3.2 g/dL — ABNORMAL LOW (ref 3.5–5.0)

## 2019-03-01 LAB — CBC WITH DIFFERENTIAL/PLATELET
Abs Immature Granulocytes: 0.1 10*3/uL — ABNORMAL HIGH (ref 0.00–0.07)
Basophils Absolute: 0 10*3/uL (ref 0.0–0.1)
Basophils Relative: 0 %
Eosinophils Absolute: 0 10*3/uL (ref 0.0–0.5)
Eosinophils Relative: 0 %
HCT: 25.9 % — ABNORMAL LOW (ref 39.0–52.0)
Hemoglobin: 7.8 g/dL — ABNORMAL LOW (ref 13.0–17.0)
Immature Granulocytes: 1 %
Lymphocytes Relative: 2 %
Lymphs Abs: 0.3 10*3/uL — ABNORMAL LOW (ref 0.7–4.0)
MCH: 29.3 pg (ref 26.0–34.0)
MCHC: 30.1 g/dL (ref 30.0–36.0)
MCV: 97.4 fL (ref 80.0–100.0)
Monocytes Absolute: 0.4 10*3/uL (ref 0.1–1.0)
Monocytes Relative: 3 %
Neutro Abs: 11.6 10*3/uL — ABNORMAL HIGH (ref 1.7–7.7)
Neutrophils Relative %: 94 %
Platelets: 329 10*3/uL (ref 150–400)
RBC: 2.66 MIL/uL — ABNORMAL LOW (ref 4.22–5.81)
RDW: 15.9 % — ABNORMAL HIGH (ref 11.5–15.5)
WBC: 12.4 10*3/uL — ABNORMAL HIGH (ref 4.0–10.5)
nRBC: 0.2 % (ref 0.0–0.2)

## 2019-03-01 LAB — BASIC METABOLIC PANEL
Anion gap: 10 (ref 5–15)
BUN: 113 mg/dL — ABNORMAL HIGH (ref 8–23)
CO2: 27 mmol/L (ref 22–32)
Calcium: 8.3 mg/dL — ABNORMAL LOW (ref 8.9–10.3)
Chloride: 100 mmol/L (ref 98–111)
Creatinine, Ser: 2.38 mg/dL — ABNORMAL HIGH (ref 0.61–1.24)
GFR calc Af Amer: 30 mL/min — ABNORMAL LOW (ref >60)
GFR calc non Af Amer: 26 mL/min — ABNORMAL LOW (ref >60)
Glucose, Bld: 205 mg/dL — ABNORMAL HIGH (ref 70–99)
Potassium: 5 mmol/L (ref 3.5–5.1)
Sodium: 137 mmol/L (ref 135–145)

## 2019-03-01 LAB — AEROBIC CULTURE W GRAM STAIN (SUPERFICIAL SPECIMEN)
Culture: NO GROWTH
Gram Stain: NONE SEEN

## 2019-03-01 MED ORDER — METHYLPREDNISOLONE SODIUM SUCC 125 MG IJ SOLR: 60.0000 mg | Freq: Two times a day (BID) | INTRAMUSCULAR | Status: DC

## 2019-03-01 MED ORDER — METHYLPREDNISOLONE SODIUM SUCC 125 MG IJ SOLR
125.0000 mg | Freq: Once | INTRAMUSCULAR | Status: AC
  Administered 2019-03-01: 125 mg via INTRAVENOUS
  Filled 2019-03-01: qty 2

## 2019-03-01 MED ORDER — METHYLPREDNISOLONE SODIUM SUCC 125 MG IJ SOLR: 60.0000 mg | Freq: Two times a day (BID) | INTRAMUSCULAR | Status: AC

## 2019-03-01 NOTE — Progress Notes (Signed)
[facility], [place] 03/01/19  Subjective:   Patient is doing well Feels like his lower extremity edema is improving although not resolved Breathing is more comfortable but still requiring oxygen by nasal cannula Serum creatinine 2.3  06/06 0701 - 06/07 0700 In: 1082.6 [P.O.:480; I.V.:102.6; IV Piggyback:500] Out: 1655 [Urine:1655]   Objective:  Vital signs in last 24 hours:  Temp:  [97.5 F (36.4 C)-97.9 F (36.6 C)] 97.9 F (36.6 C) (06/07 0837) Pulse Rate:  [59-65] 63 (06/07 0837) Resp:  [16-20] 20 (06/07 0837) BP: (109-153)/(47-68) 153/47 (06/07 0837) SpO2:  [92 %-95 %] 95 % (06/07 0837) Weight:  [100.3 kg] 100.3 kg (06/07 0122)  Weight change: 0.771 kg Filed Weights   02/27/19 0439 02/28/19 0312 03/01/19 0122  Weight: 98.7 kg 99.6 kg 100.3 kg    Intake/Output:    Intake/Output Summary (Last 24 hours) at 03/01/2019 1010 Last data filed at 03/01/2019 3704 Gross per 24 hour  Intake 1442.56 ml  Output 1355 ml  Net 87.56 ml   Physical Exam: General:  Elderly gentleman, laying in the bed  HEENT  anicteric, moist oral mucous membranes  Neck:  Supple, no masses  Lungs:  Mild bilateral crackles, oxygen by nasal cannula  Heart::  Systolic murmur, no rub  Abdomen:  Obese, soft, nontender  Extremities:  + Edema bilaterally  Neurologic:  Alert, oriented  Skin:  No acute rashes       Basic Metabolic Panel:  Recent Labs  Lab 02/24/19 0459 02/25/19 0225 02/26/19 0632 02/27/19 0509 02/28/19 0558 03/01/19 0420  NA 141 140 139 137 136 137  K 4.1 4.7 4.4 5.0 5.1 5.0  CL 102 102 100 102 99 100  CO2 29 30 28 23 24 27   GLUCOSE 116* 106* 191* 179* 211* 205*  BUN 42* 48* 62* 90* 109* 113*  CREATININE 1.63* 1.65* 1.92* 2.40* 2.50* 2.38*  CALCIUM 8.7* 8.8* 8.8* 8.6* 8.2* 8.3*  MG 2.4  --   --   --   --   --      CBC: Recent Labs  Lab 02/24/19 0459 02/25/19 0225 02/27/19 0509 02/28/19 0558 03/01/19 0420  WBC 10.6* 10.8* 22.0*  18.0* 12.4*  NEUTROABS  --   --   --   --  11.6*  HGB 8.1* 7.6* 8.1* 7.8* 7.8*  HCT 27.3* 27.0* 27.0* 25.9* 25.9*  MCV 97.5 101.5* 97.1 95.6 97.4  PLT 290 301 358 360 329     No results found for: HEPBSAG, HEPBSAB, HEPBIGM    Microbiology:  Recent Results (from the past 240 hour(s))  SARS Coronavirus 2 (CEPHEID- Performed in [facility] hospital lab), Hosp Order     Status: None   Collection Time: 02/23/19  1:05 PM  Result Value Ref Range Status   SARS Coronavirus 2 NEGATIVE NEGATIVE Final    Comment: (NOTE) If result is NEGATIVE SARS-CoV-2 target nucleic acids are NOT DETECTED. The SARS-CoV-2 RNA is generally detectable in upper and lower  respiratory specimens during the acute phase of infection. The lowest  concentration of SARS-CoV-2 viral copies this assay can detect is 250  copies / mL. A negative result does not preclude SARS-CoV-2 infection  and should not be used as the sole basis for treatment or other  patient management decisions.  A negative result may occur with  improper specimen collection / handling, submission of specimen other  than nasopharyngeal swab, presence of viral mutation(s) within the  areas targeted by this assay, and inadequate number of  viral copies  (<250 copies / mL). A negative result must be combined with clinical  observations, patient history, and epidemiological information. If result is POSITIVE SARS-CoV-2 target nucleic acids are DETECTED. The SARS-CoV-2 RNA is generally detectable in upper and lower  respiratory specimens dur ing the acute phase of infection.  Positive  results are indicative of active infection with SARS-CoV-2.  Clinical  correlation with patient history and other diagnostic information is  necessary to determine patient infection status.  Positive results do  not rule out bacterial infection or co-infection with other viruses. If result is PRESUMPTIVE POSTIVE SARS-CoV-2 nucleic acids MAY BE PRESENT.   A  presumptive positive result was obtained on the submitted specimen  and confirmed on repeat testing.  While 2019 novel coronavirus  (SARS-CoV-2) nucleic acids may be present in the submitted sample  additional confirmatory testing may be necessary for epidemiological  and / or clinical management purposes  to differentiate between  SARS-CoV-2 and other Sarbecovirus currently known to infect humans.  If clinically indicated additional testing with an alternate test  methodology [phone]) is advised. The SARS-CoV-2 RNA is generally  detectable in upper and lower respiratory sp ecimens during the acute  phase of infection. The expected result is Negative. Fact Sheet for Patients:  StrictlyIdeas.no Fact Sheet for Healthcare Providers: BankingDealers.co.za This test is not yet approved or cleared by the [place] FDA and has been authorized for detection and/or diagnosis of SARS-CoV-2 by FDA under an Emergency Use Authorization (EUA).  This EUA will remain in effect (meaning this test can be used) for the duration of the COVID-19 declaration under Section 564(b)(1) of the Act, 21 U.S.C. section 360bbb-3(b)(1), unless the authorization is terminated or revoked sooner. Performed at [facility], [address] [place], [place] [address]   Blood culture (routine x 2)     Status: None   Collection Time: 02/23/19  1:05 PM  Result Value Ref Range Status   Specimen Description BLOOD LEFT ANTECUBITAL  Final   Special Requests   Final    BOTTLES DRAWN AEROBIC ONLY Blood Culture results may not be optimal due to an inadequate volume of blood received in culture bottles   Culture   Final    NO GROWTH 5 DAYS Performed at [facility], [address], [place], [place] [address]    Report Status 02/28/2019 FINAL  Final  Blood culture (routine x 2)     Status: None   Collection Time: 02/23/19  1:05 PM  Result Value Ref Range  Status   Specimen Description BLOOD RIGHT ANTECUBITAL  Final   Special Requests   Final    BOTTLES DRAWN AEROBIC AND ANAEROBIC Blood Culture adequate volume   Culture   Final    NO GROWTH 5 DAYS Performed at [facility], [address], [place], [place] [address]    Report Status 02/28/2019 FINAL  Final  Aerobic Culture (superficial specimen)     Status: None   Collection Time: 02/27/19 10:41 AM  Result Value Ref Range Status   Specimen Description   Final    WOUND LEFT THIGH Performed at [facility], [place] [address] [place], [place] [address]    Special Requests   Final    NONE Performed at [facility], [place], [place] [address]    Gram Stain NO WBC SEEN NO ORGANISMS SEEN   Final   Culture   Final    NO GROWTH 2 DAYS Performed  at [facility], [place] [address] [place],  [address]    Report Status 03/01/2019 FINAL  Final    Coagulation Studies: No results for input(s): LABPROT, INR in the last 72 hours.  Urinalysis: No results for input(s): COLORURINE, LABSPEC, PHURINE, GLUCOSEU, HGBUR, BILIRUBINUR, KETONESUR, PROTEINUR, UROBILINOGEN, NITRITE, LEUKOCYTESUR in the last 72 hours.  Invalid input(s): APPERANCEUR    Imaging: No results found.   Medications:   . sodium chloride 10 mL (03/01/19 0513)  . ampicillin-sulbactam (UNASYN) IV 3 g (03/01/19 0516)   . amiodarone  200 mg Oral Daily  . apixaban  5 mg Oral BID  . aspirin  81 mg Oral Daily  . atorvastatin  80 mg Oral Daily  . brimonidine  1 drop Both Eyes TID  . budesonide (PULMICORT) nebulizer solution  0.25 mg Nebulization BID  . Chlorhexidine Gluconate Cloth  6 each Topical Q0600  . cholecalciferol  1,000 Units Oral Daily  . furosemide  20 mg Intravenous BID  . guaiFENesin  600 mg Oral BID  . ipratropium-albuterol  3 mL Nebulization TID  . latanoprost  1 drop Both Eyes QHS  . magnesium oxide  400 mg Oral BID  . methylPREDNISolone (SOLU-MEDROL)  injection  60 mg Intravenous Q12H  . metoprolol  200 mg Oral Daily  . mupirocin ointment  1 application Nasal BID  . neomycin-bacitracin-polymyxin   Topical BID  . sodium chloride flush  3 mL Intravenous Q12H  . tiotropium  18 mcg Inhalation Daily  . vitamin B-12  1,000 mcg Oral Daily   sodium chloride, acetaminophen, diphenhydrAMINE, ipratropium-albuterol, nitroGLYCERIN, ondansetron (ZOFRAN) IV, sodium chloride flush  Assessment/ Plan:  73 y.o. African-American male with medical problems of coronary disease, history of PCI 2013, nonischemic dilated cardiomyopathy, AICD 2006, aortic stenosis, Taylor 2018, atrial fibrillation on Eliquis, CKD stage III, COPD with 3 L supplemental oxygen, was admitted on 02/23/2019 with worsening shortness of breath and lower extremity edema.   1.  Acute kidney injury on chronic kidney disease stage III Baseline creatinine 1.65/GFR 47 at the time of admission on July 3 Creatinine peaked at 2.50 and now has started to improve  BUN may be elevated due to azotemia from IV steroids Electrolytes and volume status are acceptable.  No acute indication for dialysis  2.  Shortness of breath and volume overload with lower extremity edema We will continue Lasix at a lower dose of 20 mg twice a day - consider changing to oral torsemide on Monday Avoid hypotension   LOS: [place] 6/7/202010:10 AM  [place], [place]  Note: This note was prepared with Dragon dictation. Any transcription errors are unintentional

## 2019-03-01 NOTE — Progress Notes (Signed)
Alerted by CCMD that his sats are intermittently dropping to low 80's.  Confirmed the reading,  On 5L [place] his sats was 72%.  Gave him a duoneb tx.  Increased oxygen to 7L [place].  Sats hovering around 88%.  Notified Dr. Brett Albino.  Asked Respiratory to evaluate him to determine if he needs other treatment or other oxygen delivery.

## 2019-03-01 NOTE — Progress Notes (Signed)
[facility] NAME: Darius Taylor    MR#:  [id number]  DATE OF BIRTH:  [birth year]  SUBJECTIVE:   Chief Complaint  Patient presents with  . Shortness of Breath   Patient seen at the bedside. He is found sitting in commode in no acute distress. Patient report that the continues to feel much better today. Has mild SOB with exertion and still requiring supplemental oxygen.  REVIEW OF SYSTEMS:  Review of Systems  Constitutional: Negative for chills, fever, malaise/fatigue and weight loss.  HENT: Negative for congestion, hearing loss and sore throat.   Eyes: Negative for blurred vision and double vision.  Respiratory: Positive for shortness of breath. Negative for cough and wheezing.   Cardiovascular: Positive for leg swelling. Negative for chest pain, palpitations and orthopnea.  Gastrointestinal: Negative for abdominal pain, diarrhea, nausea and vomiting.  Genitourinary: Negative for dysuria and urgency.  Musculoskeletal: Negative for myalgias.  Skin: Negative for rash.  Neurological: Negative for dizziness, sensory change, speech change, focal weakness and headaches.  Psychiatric/Behavioral: Negative for depression.   DRUG ALLERGIES:  No Known Allergies VITALS:  Blood pressure (!) 153/47, pulse 63, temperature 97.9 F (36.6 C), resp. rate 16, height 5\' 11"  (1.803 m), weight 100.3 kg, SpO2 95 %. PHYSICAL EXAMINATION:   GENERAL:  73 y.o.-year-old patient lying in the bed with no acute distress.  EYES: Pupils equal, round, reactive to light and accommodation. No scleral icterus. Extraocular muscles intact.  HEENT: Head atraumatic, normocephalic. Oropharynx and nasopharynx clear.  NECK:  Supple, no jugular venous distention. No thyroid enlargement, no tenderness.  LUNGS: Normal breath sounds bilaterally, no wheezing, rales,rhonchi or crepitation. No use of accessory muscles of respiration.  CARDIOVASCULAR: S1, S2 normal. No  murmurs, rubs, or gallops.  ABDOMEN: Soft, nontender, nondistended. Bowel sounds present. No organomegaly or mass.  EXTREMITIES: No pedal edema, cyanosis, or clubbing. No rash or lesions. + pedal pulses MUSCULOSKELETAL: Normal bulk, and power was 5+ grip and elbow, knee, and ankle flexion and extension bilaterally.  NEUROLOGIC:Alert and oriented x 3. CN 2-12 intact. Sensation to light touch and cold stimuli intact bilaterally. Finger to nose nl. Babinski is downgoing. DTR's (biceps, patellar, and achilles) 2+ and symmetric throughout. Gait not tested due to safety concern. PSYCHIATRIC: The patient is alert and oriented x 3.  SKIN: Left thigh cellulitis as below     DATA REVIEWED:  LABORATORY PANEL:  Male CBC Recent Labs  Lab 03/01/19 0420  WBC 12.4*  HGB 7.8*  HCT 25.9*  PLT 329   ------------------------------------------------------------------------------------------------------------------ Chemistries  Recent Labs  Lab 02/23/19 1305 02/24/19 0459  03/01/19 0420  NA 138 141   < > 137  K 4.0 4.1   < > 5.0  CL 101 102   < > 100  CO2 28 29   < > 27  GLUCOSE 161* 116*   < > 205*  BUN 44* 42*   < > 113*  CREATININE 1.79* 1.63*   < > 2.38*  CALCIUM 8.6* 8.7*   < > 8.3*  MG  --  2.4  --   --   AST 24  --   --   --   ALT 22  --   --   --   ALKPHOS 82  --   --   --   BILITOT 1.1  --   --   --    < > = values in this interval not displayed.   RADIOLOGY:  No results found. ASSESSMENT AND PLAN:   73 y.o. male with history of chronic atrial fibrillation on anticoagulation with Eliquis,chronic kidney disease stage III,COPD,prior history of AICD,chronic diastolic CHF and prior history of asthma admitted with acute hypoxic respiratory failure secondary to CHF exacerbation.  1.Acute hypoxic respiratory failure secondary to CHF exacerbation. - Improved - Continue nebs   2.Acute on chronic diastolic CHF exacerbation.  - Last Echo 10/2018 with  EF 50-55% - Continue  [place] - Beta-Blockade: Metoprolol - Diuretics: Furosemide 20 mg BID IV diureses >1L negative per day until approach euvolemia / worsening renal function. - Low salt diet  - Strict I&Os - Fluid restriction - Cardiology input appreciated  3.  Advanced COPD - Duo nebs every 6 - Supplemental oxygen as needed - Taper prednisone to 60 mg IV BID  4.  Left thigh Cellulitis - no worsening symptoms - IV Unasyn - Leukocytosis likely due to steroid now improved   5.Chronic atrial fibrillation -rate controlled on Amiodarone + Metoprolol - Continue Eliquis  6. Acute chronic kidney disease stage III - slight improvement in creatine today - Avoid nephrotoxins - Continue to monitor renal function - Nephrology following  7.Mildly elevated troponin of 0.08 -likely due to supply demand ischemia from CHF  8. Anemia, hemoglobin 8.1, hematocrit 27.3. During admission 01/2019, patient's hemoglobin was 7.4; he received 1 dose of IV Venofer, and I unit of PRBC. Otelia Sergeant studies suggested iron deficiency with no acute bleeding. Needs outpatient monitoring   All the records are reviewed and case discussed with Care Management/Social Worker. Management plans discussed with the patient, family and they are in agreement.  CODE STATUS: Full Code  TOTAL TIME TAKING CARE OF THIS PATIENT: 39 minutes.   More than 50% of the time was spent in counseling/coordination of care: YES  POSSIBLE D/C IN 1 DAYS, DEPENDING ON CLINICAL CONDITION.    03/01/2019 at 12:38 PM  This patient was staffed with Dr. Valetta Fuller, [facility] who personally evaluated patient, reviewed documentation and agreed with assessment and plan of care as above.  Rufina Falco, DNP, FNP-BC [facility] Nurse Practitioner   Between 7am to 6pm - Pager [phone]  After 6pm go to [email or web address] - Technical brewer [facility]  Office  [phone]  CC: Primary care physician; [employer]  Note: This dictation was prepared with Diplomatic Services operational officer dictation along with smaller Company secretary. Any transcriptional errors that result from this process are unintentional.

## 2019-03-01 NOTE — Plan of Care (Signed)
  Problem: Education: Goal: Knowledge of General Education information will improve Description Including pain rating scale, medication(s)/side effects and non-pharmacologic comfort measures Outcome: Progressing   Problem: Clinical Measurements: Goal: Will remain free from infection Outcome: Progressing   Problem: Safety: Goal: Ability to remain free from injury will improve Outcome: Progressing   Problem: Activity: Goal: Capacity to carry out activities will improve Outcome: Progressing

## 2019-03-02 LAB — HEMOGLOBIN AND HEMATOCRIT, BLOOD
HCT: 28.5 % — ABNORMAL LOW (ref 39.0–52.0)
Hemoglobin: 8.6 g/dL — ABNORMAL LOW (ref 13.0–17.0)

## 2019-03-02 LAB — BASIC METABOLIC PANEL
Anion gap: 11 (ref 5–15)
BUN: 116 mg/dL — ABNORMAL HIGH (ref 8–23)
CO2: 28 mmol/L (ref 22–32)
Calcium: 8.3 mg/dL — ABNORMAL LOW (ref 8.9–10.3)
Chloride: 103 mmol/L (ref 98–111)
Creatinine, Ser: 2.09 mg/dL — ABNORMAL HIGH (ref 0.61–1.24)
GFR calc Af Amer: 35 mL/min — ABNORMAL LOW (ref >60)
GFR calc non Af Amer: 30 mL/min — ABNORMAL LOW (ref >60)
Glucose, Bld: 226 mg/dL — ABNORMAL HIGH (ref 70–99)
Potassium: 4.7 mmol/L (ref 3.5–5.1)
Sodium: 142 mmol/L (ref 135–145)

## 2019-03-02 LAB — CBC WITH DIFFERENTIAL/PLATELET
Abs Immature Granulocytes: 0.14 10*3/uL — ABNORMAL HIGH (ref 0.00–0.07)
Basophils Absolute: 0 10*3/uL (ref 0.0–0.1)
Basophils Relative: 0 %
Eosinophils Absolute: 0 10*3/uL (ref 0.0–0.5)
Eosinophils Relative: 0 %
HCT: 24.9 % — ABNORMAL LOW (ref 39.0–52.0)
Hemoglobin: 7.4 g/dL — ABNORMAL LOW (ref 13.0–17.0)
Immature Granulocytes: 1 %
Lymphocytes Relative: 3 %
Lymphs Abs: 0.3 10*3/uL — ABNORMAL LOW (ref 0.7–4.0)
MCH: 28.7 pg (ref 26.0–34.0)
MCHC: 29.7 g/dL — ABNORMAL LOW (ref 30.0–36.0)
MCV: 96.5 fL (ref 80.0–100.0)
Monocytes Absolute: 0.7 10*3/uL (ref 0.1–1.0)
Monocytes Relative: 6 %
Neutro Abs: 10.5 10*3/uL — ABNORMAL HIGH (ref 1.7–7.7)
Neutrophils Relative %: 90 %
Platelets: 282 10*3/uL (ref 150–400)
RBC: 2.58 MIL/uL — ABNORMAL LOW (ref 4.22–5.81)
RDW: 15.6 % — ABNORMAL HIGH (ref 11.5–15.5)
WBC: 11.7 10*3/uL — ABNORMAL HIGH (ref 4.0–10.5)
nRBC: 0.4 % — ABNORMAL HIGH (ref 0.0–0.2)

## 2019-03-02 LAB — PREPARE RBC (CROSSMATCH)

## 2019-03-02 MED ORDER — AMOXICILLIN-POT CLAVULANATE 500-125 MG PO TABS
500.0000 mg | ORAL_TABLET | Freq: Two times a day (BID) | ORAL | Status: DC
  Filled 2019-03-02 (×2): qty 1

## 2019-03-02 MED ORDER — AMOXICILLIN-POT CLAVULANATE 500-125 MG PO TABS
500.0000 mg | ORAL_TABLET | Freq: Two times a day (BID) | ORAL | Status: AC
  Administered 2019-03-02 – 2019-03-04 (×6): 500 mg via ORAL
  Filled 2019-03-02 (×6): qty 1

## 2019-03-02 MED ORDER — FUROSEMIDE 10 MG/ML IJ SOLN
40.0000 mg | Freq: Two times a day (BID) | INTRAMUSCULAR | Status: DC
  Administered 2019-03-02 – 2019-03-03 (×2): 40 mg via INTRAVENOUS
  Filled 2019-03-02 (×2): qty 4

## 2019-03-02 MED ORDER — SODIUM CHLORIDE 0.9% IV SOLUTION: Freq: Once | INTRAVENOUS | Status: DC

## 2019-03-02 NOTE — Progress Notes (Signed)
I unit PRBC started at  1430

## 2019-03-02 NOTE — Consult Note (Addendum)
Pharmacy Antibiotic Note  Darius Taylor is a 73 y.o. male admitted on 02/23/2019 with cellulitis.  Pharmacy has been consulted for Unasyn dosing. Presently, it is day 4 of Unasyn. May consider switching to PO antibiotics given cellulitis is improving.   Plan: Continue Unasyn 3 g q8H, for now.  Monitor CrCl, if < 30 mL/min switch to 3g q12H.  Will discuss switching to comparable PO therapy.   Height: 5\' 11"  (180.3 cm) Weight: 221 lb 12.8 oz (100.6 kg) IBW/kg (Calculated) : 75.3  Temp (24hrs), Avg:97.8 F (36.6 C), Min:97.6 F (36.4 C), Max:98.1 F (36.7 C)  Recent Labs  Lab 02/23/19 1305  02/25/19 0225 02/26/19 3112 02/27/19 0509 02/28/19 0558 03/01/19 0420 03/02/19 0447  WBC 10.9*   < > 10.8*  --  22.0* 18.0* 12.4* 11.7*  CREATININE 1.79*   < > 1.65* 1.92* 2.40* 2.50* 2.38* 2.09*  LATICACIDVEN 1.9  --   --   --   --   --   --   --    < > = values in this interval not displayed.    Estimated Creatinine Clearance: 37.5 mL/min (A) (by C-G formula based on SCr of 2.09 mg/dL (H)).    No Known Allergies  Antimicrobials this admission: 6/3 augmentin >> 6/5 6/5 Unasyn >>   Dose adjustments this admission: None  Microbiology results: 6/1 BCx: pending  Thank you for allowing pharmacy to be a part of this patient's care.  Rowland Lathe, PharmD 03/02/2019 9:56 AM

## 2019-03-02 NOTE — Progress Notes (Signed)
Pulmonary Medicine          Date: 03/02/2019,   MRN# [id number] Darius Taylor 12-14-1945     AdmissionWeight: 97.5 kg                 CurrentWeight: 100.6 kg         Subjective   Patient is laying in bed comfortably status post PRBC transfusion, he states overall he has clinically improved with less shortness of breath.   PAST MEDICAL HISTORY   Past Medical History:  Diagnosis Date  . Aortic stenosis   . Arrhythmia    atrial fibrillation  . Asthma   . Atrial fibrillation ([place])   . CAD (coronary artery disease)   . Cancer [facility])    colon, bladder and kidney  . CHF (congestive heart failure) ([place])   . 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 COPD (chronic obstructive pulmonary disease) ([place])   . H/O ventricular tachycardia   . Hypertension   . Nonischemic cardiomyopathy ([place])    Status post AICD and pacemaker  . Renal cell carcinoma ([place])      SURGICAL HISTORY   Past Surgical History:  Procedure Laterality Date  . CARDIAC DEFIBRILLATOR PLACEMENT    . CARDIAC DEFIBRILLATOR PLACEMENT    . COLONOSCOPY WITH PROPOFOL N/A 06/10/2018   Procedure: COLONOSCOPY WITH PROPOFOL;  Surgeon: Lin Landsman, MD;  Location: [facility] ENDOSCOPY;  Service: Gastroenterology;  Laterality: N/A;  . CORONARY ANGIOPLASTY  09/25/2016  . IRRIGATION AND DEBRIDEMENT HEMATOMA Left 10/24/2016   Procedure: IRRIGATION AND DEBRIDEMENT HEMATOMA;  Surgeon: Florene Glen, MD;  Location: [facility] ORS;  Service: General;  Laterality: Left;  . KIDNEY SURGERY     left ne[hrectomy for RCC  . PACEMAKER IMPLANT    . PARTIAL COLECTOMY       FAMILY HISTORY   Family History  Problem Relation Age of Onset  . Hypertension Mother   . Stroke Father      SOCIAL HISTORY   Social History   Tobacco Use  . Smoking status: Former Research scientist (life sciences)  . Smokeless tobacco: Never Used  Substance Use Topics  . Alcohol use: No  . Drug use: No     MEDICATIONS    Home Medication:    Current Medication:   Current Facility-Administered Medications:  .  0.9 %  sodium chloride infusion (Manually program via Guardrails IV Fluids), , Intravenous, Once, Ouma, Bing Neighbors, NP .  0.9 %  sodium chloride infusion, 250 mL, Intravenous, PRN, Stark Jock, Jude, MD, Stopped at 03/02/19 1001 .  acetaminophen (TYLENOL) tablet 650 mg, 650 mg, Oral, Q4H PRN, Stark Jock, Jude, MD, 650 mg at 02/28/19 2207 .  amiodarone (PACERONE) tablet 200 mg, 200 mg, Oral, Daily, Ojie, Jude, MD, 200 mg at 03/02/19 1005 .  amoxicillin-clavulanate (AUGMENTIN) 500-125 MG per tablet 500 mg, 500 mg, Oral, Q12H, Mayo, Pete Pelt, MD, 500 mg at 03/02/19 1234 .  apixaban (ELIQUIS) tablet 5 mg, 5 mg, Oral, BID, Ojie, Jude, MD, 5 mg at 03/02/19 1004 .  aspirin chewable tablet 81 mg, 81 mg, Oral, Daily, Ojie, Jude, MD, 81 mg at 03/02/19 1005 .  atorvastatin (LIPITOR) tablet 80 mg, 80 mg, Oral, Daily, Ojie, Jude, MD, 80 mg at 03/02/19 1004 .  brimonidine (ALPHAGAN) 0.15 % ophthalmic solution 1 drop, 1 drop, Both Eyes, TID, Ojie, Jude, MD, 1 drop at 03/02/19 1738 .  budesonide (PULMICORT) nebulizer solution 0.25 mg, 0.25 mg, Nebulization, BID, Dustin Flock, MD, 0.25 mg at 03/02/19 0810 .  Chlorhexidine Gluconate Cloth 2 % PADS 6 each, 6 each, Topical, Q0600, Dustin Flock, MD, 6 each at 03/02/19 [phone] .  cholecalciferol (VITAMIN D) tablet 1,000 Units, 1,000 Units, Oral, Daily, Ojie, Jude, MD, 1,000 Units at 03/02/19 1004 .  diphenhydrAMINE (BENADRYL) capsule 25 mg, 25 mg, Oral, QHS PRN, Lance Coon, MD, 25 mg at 02/28/19 2207 .  furosemide (LASIX) injection 40 mg, 40 mg, Intravenous, BID, Ouma, Bing Neighbors, NP, 40 mg at 03/02/19 1734 .  guaiFENesin (MUCINEX) 12 hr tablet 600 mg, 600 mg, Oral, BID, Dustin Flock, MD, 600 mg at 03/02/19 1005 .  ipratropium-albuterol (DUONEB) 0.5-2.5 (3) MG/3ML nebulizer solution 3 mL, 3 mL, Nebulization, TID, Dustin Flock, MD, 3 mL at 03/02/19 1330 .  ipratropium-albuterol (DUONEB) 0.5-2.5 (3) MG/3ML  nebulizer solution 3 mL, 3 mL, Nebulization, Q4H PRN, Dustin Flock, MD, 3 mL at 03/01/19 1640 .  latanoprost (XALATAN) 0.005 % ophthalmic solution 1 drop, 1 drop, Both Eyes, QHS, Ojie, Jude, MD, 1 drop at 03/01/19 2204 .  magnesium oxide (MAG-OX) tablet 400 mg, 400 mg, Oral, BID, Ojie, Jude, MD, 400 mg at 03/02/19 1005 .  metoprolol succinate (TOPROL-XL) 24 hr tablet 200 mg, 200 mg, Oral, Daily, Ojie, Jude, MD, 200 mg at 03/02/19 1005 .  mupirocin ointment (BACTROBAN) 2 % 1 application, 1 application, Nasal, BID, Dustin Flock, MD, 1 application at 23/76/28 1033 .  neomycin-bacitracin-polymyxin (NEOSPORIN) ointment, , Topical, BID, Dustin Flock, MD .  nitroGLYCERIN (NITROSTAT) SL tablet 0.4 mg, 0.4 mg, Sublingual, Q5 min PRN, Ojie, Jude, MD .  ondansetron (ZOFRAN) injection 4 mg, 4 mg, Intravenous, Q6H PRN, Ojie, Jude, MD .  sodium chloride flush (NS) 0.9 % injection 3 mL, 3 mL, Intravenous, Q12H, Ojie, Jude, MD, 3 mL at 03/02/19 1035 .  sodium chloride flush (NS) 0.9 % injection 3 mL, 3 mL, Intravenous, PRN, Ojie, Jude, MD .  tiotropium (SPIRIVA) inhalation capsule ([facility] use ONLY) 18 mcg, 18 mcg, Inhalation, Daily, Ojie, Jude, MD, 18 mcg at 03/02/19 1006 .  vitamin B-12 (CYANOCOBALAMIN) tablet 1,000 mcg, 1,000 mcg, Oral, Daily, Dustin Flock, MD, 1,000 mcg at 03/02/19 1035    ALLERGIES   Patient has no known allergies.     REVIEW OF SYSTEMS    Review of Systems:  Gen:  Denies  fever, sweats, chills weigh loss  HEENT: Denies blurred vision, double vision, ear pain, eye pain, hearing loss, nose bleeds, sore throat Cardiac:  No dizziness, chest pain or heaviness, chest tightness,edema Resp:   Denies cough or sputum porduction, shortness of breath,wheezing, hemoptysis,  Gi: Denies swallowing difficulty, stomach pain, nausea or vomiting, diarrhea, constipation, bowel incontinence Gu:  Denies bladder incontinence, burning urine Ext:   Denies Joint pain, stiffness or swelling  Skin: Denies  skin rash, easy bruising or bleeding or hives Endoc:  Denies polyuria, polydipsia , polyphagia or weight change Psych:   Denies depression, insomnia or hallucinations   Other:  All other systems negative   VS: BP (!) 157/90   Pulse 62   Temp 98.4 F (36.9 C)   Resp (!) 22   Ht 5\' 11"  (1.803 m)   Wt 100.6 kg   SpO2 96%   BMI 30.93 kg/m      PHYSICAL EXAM    GENERAL:NAD, no fevers, chills, no weakness no fatigue HEAD: Normocephalic, atraumatic.  EYES: Pupils equal, round, reactive to light. Extraocular muscles intact. No scleral icterus.  MOUTH: Moist mucosal membrane. Dentition intact. No abscess noted.  EAR, NOSE, THROAT: Clear without exudates. No external  lesions.  NECK: Supple. No thyromegaly. No nodules. No JVD.  PULMONARY: Bilateral crackles at the bases CARDIOVASCULAR: S1 and S2. Regular rate and rhythm. No murmurs, rubs, or gallops. No edema. Pedal pulses 2+ bilaterally.  GASTROINTESTINAL: Soft, nontender, nondistended. No masses. Positive bowel sounds. No hepatosplenomegaly.  MUSCULOSKELETAL: 2+ lower extremity edema on the left with 1+ on the right NEUROLOGIC: Cranial nerves II through XII are intact. No gross focal neurological deficits. Sensation intact. Reflexes intact.  SKIN: No ulceration, lesions, rashes, or cyanosis. Skin warm and dry. Turgor intact.  PSYCHIATRIC: Mood, affect within normal limits. The patient is awake, alert and oriented x 3. Insight, judgment intact.       IMAGING    Dg Chest Port 1 View  Result Date: 03/01/2019 CLINICAL DATA:  Intermittent hypoxia. EXAM: PORTABLE CHEST 1 VIEW COMPARISON:  02/23/2019 FINDINGS: Left-sided pacemaker unchanged. Lungs are adequately inflated and demonstrate slight improvement and mild hazy perihilar attenuation of the vessels suggesting improving interstitial edema. Persistent small left effusion. Stable moderate cardiomegaly. Remainder the exam is unchanged. IMPRESSION: Findings suggesting  slight interval improvement with residual mild vascular congestion and small left effusion. Stable cardiomegaly. Electronically Signed   By: Marin Olp M.D.   On: 03/01/2019 17:26   Dg Chest Port 1 View  Result Date: 02/23/2019 CLINICAL DATA:  Shortness of breath EXAM: PORTABLE CHEST 1 VIEW COMPARISON:  Jan 28, 2019 and January 09, 2019 FINDINGS: There is interstitial edema throughout the lungs, essentially stable. There are small pleural effusions bilaterally. There is no frank airspace consolidation. There is cardiomegaly with pulmonary venous hypertension. Pacemaker leads are attached to the right atrium and right ventricle. There is aortic atherosclerosis. No adenopathy. No bone lesions. IMPRESSION: Pulmonary vascular congestion with small pleural effusions and interstitial edema. Appearance is felt to be indicative of a degree of congestive heart failure. There may well be superimposed noncardiogenic interstitial pneumonitis, although there is no frank airspace consolidation. Pacemaker leads attached to right atrium and right ventricle. Aortic Atherosclerosis (ICD10-I70.0). Electronically Signed   By: Lowella Grip III M.D.   On: 02/23/2019 13:25      ASSESSMENT/PLAN    Acute on chronic hypoxemic respiratory failure due to Acute on chronic systolic and diastolic decompensated CHF with EF 50 to 53% complicated by advanced COPD with chronic hypoxemia, severe anemia and possible pulmonary hypertension suggested by severely elevated RVSP most recent echo -Agree with diuresis- nephro on case s/p PRBC transfusion -Cardiology on case appreciate input -Strict I's and O's -Fluid intakerestirction [id number] Complicated by CKD - renal team on case - appreciate input    Advanced COPD -continue COPD carepath -DUonebs,  -IS at bedsie -Acapella       Thank you for allowing me to participate in the care of this patient.   Patient/Family are satisfied with care plan and all questions  have been answered.  This document was prepared using Dragon voice recognition software and may include unintentional dictation errors.     Ottie Glazier, M.D.  Division of [place]

## 2019-03-02 NOTE — Progress Notes (Signed)
[facility] [place] NAME: Monterio Bob    MR#:  [id number]  DATE OF BIRTH:  May 05, 1946  SUBJECTIVE:   Chief Complaint  Patient presents with  . Shortness of Breath   Patient seen at the bedside. He is found sitting up in bed eating breakfast.  Per nursing staff, patient's oxygen saturation drooped to 72% on 5L. He is currently on 10L via nasal cannula and state he feels better unless he exerts himself.    REVIEW OF SYSTEMS:  Review of Systems  Constitutional: Negative for chills, fever, malaise/fatigue and weight loss.  HENT: Negative for congestion, hearing loss and sore throat.   Eyes: Negative for blurred vision and double vision.  Respiratory: Positive for shortness of breath. Negative for cough and wheezing.   Cardiovascular: Positive for leg swelling. Negative for chest pain, palpitations and orthopnea.  Gastrointestinal: Negative for abdominal pain, diarrhea, nausea and vomiting.  Genitourinary: Negative for dysuria and urgency.  Musculoskeletal: Negative for myalgias.  Skin: Negative for rash.  Neurological: Negative for dizziness, sensory change, speech change, focal weakness and headaches.  Psychiatric/Behavioral: Negative for depression.   DRUG ALLERGIES:  No Known Allergies VITALS:  Blood pressure 139/77, pulse 63, temperature 97.6 F (36.4 C), temperature source Oral, resp. rate 20, height 5\' 11"  (1.803 m), weight 100.6 kg, SpO2 93 %. PHYSICAL EXAMINATION:   GENERAL:  73 y.o.-year-old patient lying in the bed with no acute distress.  EYES: Pupils equal, round, reactive to light and accommodation. No scleral icterus. Extraocular muscles intact.  HEENT: Head atraumatic, normocephalic. Oropharynx and nasopharynx clear.  NECK:  Supple, no jugular venous distention. No thyroid enlargement, no tenderness.  LUNGS: Normal breath sounds bilaterally, no wheezing, rales,rhonchi or crepitation. No use of accessory muscles of  respiration.  CARDIOVASCULAR: S1, S2 normal. No murmurs, rubs, or gallops.  ABDOMEN: Soft, nontender, nondistended. Bowel sounds present. No organomegaly or mass.  EXTREMITIES: No pedal edema, cyanosis, or clubbing. No rash or lesions. + pedal pulses MUSCULOSKELETAL: Normal bulk, and power was 5+ grip and elbow, knee, and ankle flexion and extension bilaterally.  NEUROLOGIC:Alert and oriented x 3. CN 2-12 intact. Sensation to light touch and cold stimuli intact bilaterally. Finger to nose nl. Babinski is downgoing. DTR's (biceps, patellar, and achilles) 2+ and symmetric throughout. Gait not tested due to safety concern. PSYCHIATRIC: The patient is alert and oriented x 3.  SKIN: Left thigh cellulitis improving  DATA REVIEWED:  LABORATORY PANEL:  Male CBC Recent Labs  Lab 03/02/19 0447  WBC 11.7*  HGB 7.4*  HCT 24.9*  PLT 282   ------------------------------------------------------------------------------------------------------------------ Chemistries  Recent Labs  Lab 02/23/19 1305 02/24/19 0459  03/02/19 0447  NA 138 141   < > 142  K 4.0 4.1   < > 4.7  CL 101 102   < > 103  CO2 28 29   < > 28  GLUCOSE 161* 116*   < > 226*  BUN 44* 42*   < > 116*  CREATININE 1.79* 1.63*   < > 2.09*  CALCIUM 8.6* 8.7*   < > 8.3*  MG  --  2.4  --   --   AST 24  --   --   --   ALT 22  --   --   --   ALKPHOS 82  --   --   --   BILITOT 1.1  --   --   --    < > = values  in this interval not displayed.   RADIOLOGY:  Dg Chest Port 1 View  Result Date: 03/01/2019 CLINICAL DATA:  Intermittent hypoxia. EXAM: PORTABLE CHEST 1 VIEW COMPARISON:  02/23/2019 FINDINGS: Left-sided pacemaker unchanged. Lungs are adequately inflated and demonstrate slight improvement and mild hazy perihilar attenuation of the vessels suggesting improving interstitial edema. Persistent small left effusion. Stable moderate cardiomegaly. Remainder the exam is unchanged. IMPRESSION: Findings suggesting slight interval  improvement with residual mild vascular congestion and small left effusion. Stable cardiomegaly. Electronically Signed   By: Marin Olp M.D.   On: 03/01/2019 17:26   ASSESSMENT AND PLAN:   73 y.o. male with history of chronic atrial fibrillation on anticoagulation with Eliquis,chronic kidney disease stage III,COPD,prior history of AICD,chronic diastolic CHF and prior history of asthma admitted with acute hypoxic respiratory failure secondary to CHF exacerbation.  1.Acute hypoxic respiratory failure secondary to CHF exacerbation. - Sats dropped requiring increased oxygenation - Continue nebs   2.Acute on chronic diastolic CHF exacerbation.  - Last Echo 10/2018 with  EF 50-55% - Continue Lorstan - Beta-Blockade: Metoprolol - Diuretics: Increase Furosemide to 40 mg BID IV diureses >1L negative per day until approach euvolemia / worsening renal function. - Completed dose of prednisone today - Low salt diet  - Strict I&Os - Fluid restriction - Cardiology input appreciated  3.  Advanced COPD - Duo nebs every 6 - Supplemental oxygen as needed - Taper prednisone to 60 mg IV BID  4.  Left thigh Cellulitis - no worsening symptoms - IV Unasyn - Leukocytosis likely due to steroid now improving  5.Chronic atrial fibrillation -rate controlled on Amiodarone + Metoprolol - Continue Eliquis  6. Acute chronic kidney disease stage III - slight improvement in creatine today - Avoid nephrotoxins - Continue to monitor renal function - Nephrology following  7.Mildly elevated troponin of 0.08 -likely due to supply demand ischemia from CHF  8. Chronic Anemia-Irons studies suggested iron deficiency with no acute bleeding.  - received 1 dose of IV Venofer, and I unit of PRBC on 01/2019,  - patient's hemoglobin 7.4; - Will transfuse 1 unit of PRBC - Continue to monitor H&H   All the records are reviewed and case discussed with Care Management/Social Worker. Management plans  discussed with the patient, family and they are in agreement.  CODE STATUS: Full Code  TOTAL TIME TAKING CARE OF THIS PATIENT: 38 minutes.   More than 50% of the time was spent in counseling/coordination of care: YES  POSSIBLE D/C IN 1 DAYS, DEPENDING ON CLINICAL CONDITION.    03/02/2019 at 10:03 AM  This patient was staffed with Dr. Valetta Fuller, [facility] who personally evaluated patient, reviewed documentation and agreed with assessment and plan of care as above.  Rufina Falco, DNP, FNP-BC [facility] Nurse Practitioner   Between 7am to 6pm - Pager [phone]  After 6pm go to [email or web address] - Technical brewer [facility]  Office  [phone]  CC: Primary care physician; [employer]  Note: This dictation was prepared with Diplomatic Services operational officer dictation along with smaller Company secretary. Any transcriptional errors that result from this process are unintentional.

## 2019-03-02 NOTE — Care Management Important Message (Signed)
Important Message  Patient Details  Name: Darius Taylor MRN: [id number] Date of Birth: Sep 10, 1946   Medicare Important Message Given:  Yes    Dannette Barbara 03/02/2019, 11:46 AM

## 2019-03-02 NOTE — Evaluation (Signed)
Physical Therapy Evaluation Patient Details Name: Darius Taylor MRN: [id number] DOB: Jan 06, 1946 Today's Date: 03/02/2019   History of Present Illness   73 yo male who comes to [facility] with many readmissions recently for SOB was readmitted on 02/23/19 for same.  Has acute CHF and respiratory failure, on 10 L O2 (3 baseline), elevated troponin from demand, and now referred to PT.  PMH: AF on eliquis, CAD, NICM c EF 25%, AICD and PPM, CKD, colonic adenocarcinoma s/p partial colectomy, renal cell carcinoma s/p Left nephrectomy, COPD, O2 use, ventricular tachycardia, HTN,    Clinical Impression  Pt is getting up to walk with min guard to walk due to O2 use and weakness, and is giving a great effort despite vastly increased O2 use.  He is expecting to go home and aware that he needs help there.  Will anticipate that if help cannot be arranged he will need to have consideration for SNF placement.  He is minimal help at most, and so will work toward direct DC home.  See acutely for these needs.    Follow Up Recommendations Home health PT;Supervision for mobility/OOB    Equipment Recommendations  None recommended by PT    Recommendations for Other Services       Precautions / Restrictions Precautions Precautions: Fall;ICD/Pacemaker(monitor O2 sats) Precaution Comments: ck O2 sats Restrictions Weight Bearing Restrictions: No      Mobility  Bed Mobility Overal bed mobility: Needs Assistance Bed Mobility: Supine to Sit     Supine to sit: Min assist        Transfers Overall transfer level: Needs assistance Equipment used: Rolling walker (2 wheeled);1 person hand held assist Transfers: Sit to/from Stand Sit to Stand: Min guard;Min assist         General transfer comment: min assist to power up and min guard for steadying initially  Ambulation/Gait Ambulation/Gait assistance: Min guard Gait Distance (Feet): 45 Feet Assistive device: Rolling walker (2 wheeled) Gait  Pattern/deviations: Step-through pattern;Decreased stride length;Wide base of support;Trunk flexed Gait velocity: reduced Gait velocity interpretation: <1.31 ft/sec, indicative of household ambulator General Gait Details: O2 sat initially was 91% then 94% after walk to [employer]            Wheelchair Mobility    Modified Rankin (Stroke Patients Only)       Balance Overall balance assessment: Needs assistance Sitting-balance support: Feet supported Sitting balance-Leahy Scale: Good     Standing balance support: Bilateral upper extremity supported;During functional activity Standing balance-Leahy Scale: Fair                               Pertinent Vitals/Pain Pain Assessment: No/denies pain    Home Living Family/patient expects to be discharged to:: Private residence Living Arrangements: Alone Available Help at Discharge: Family;Available PRN/intermittently(daughter works full time but helps with errands') Type of Home: House Home Access: Stairs to enter Entrance Stairs-Rails: Right;Can reach Software engineer of Steps: 3 Home Layout: One level Home Equipment: Shower seat;Cane - quad;Walker - 4 wheels;Walker - 2 wheels;Grab bars - toilet;Grab bars - tub/shower Additional Comments: pt states he is on 3L O2 at all times     Prior Function Level of Independence: Needs assistance   Gait / Transfers Assistance Needed: has been mod I for gait  ADL's / Homemaking Assistance Needed: Independent with basic ADL; DTR helps with grocferies, meals, transport. Pt manages his own medications.  Hand Dominance   Dominant Hand: Right    Extremity/Trunk Assessment   Upper Extremity Assessment Upper Extremity Assessment: Overall WFL for tasks assessed    Lower Extremity Assessment Lower Extremity Assessment: Generalized weakness    Cervical / Trunk Assessment Cervical / Trunk Assessment: Normal  Communication      Cognition  Arousal/Alertness: Awake/alert Behavior During Therapy: WFL for tasks assessed/performed Overall Cognitive Status: Within Functional Limits for tasks assessed                                        General Comments General comments (skin integrity, edema, etc.): O2 sats supported activity but was on 10 L vs 3 L at home    Exercises General Exercises - Lower Extremity Ankle Circles/Pumps: AROM;5 reps;Both Quad Sets: AROM;Both;10 reps Gluteal Sets: AROM;Both;10 reps   Assessment/Plan    PT Assessment Patient needs continued PT services  PT Problem List Decreased strength;Decreased range of motion;Decreased activity tolerance;Decreased balance;Decreased mobility;Decreased coordination;Decreased safety awareness;Cardiopulmonary status limiting activity;Obesity       PT Treatment Interventions DME instruction;Gait training;Stair training;Therapeutic activities;Functional mobility training;Therapeutic exercise;Balance training;Neuromuscular re-education;Patient/family education    PT Goals (Current goals can be found in the Care Plan section)  Acute Rehab PT Goals Patient Stated Goal: to get home with help PT Goal Formulation: With patient Time For Goal Achievement: 03/16/19 Potential to Achieve Goals: Good    Frequency Min 2X/week   Barriers to discharge Inaccessible home environment;Decreased caregiver support stairs and no caregiver at home    Co-evaluation               AM-PAC PT "6 Clicks" Mobility  Outcome Measure Help needed turning from your back to your side while in a flat bed without using bedrails?: None Help needed moving from lying on your back to sitting on the side of a flat bed without using bedrails?: A Little Help needed moving to and from a bed to a chair (including a wheelchair)?: A Little Help needed standing up from a chair using your arms (e.g., wheelchair or bedside chair)?: A Little Help needed to walk in hospital room?: A  Little Help needed climbing 3-5 steps with a railing? : A Little 6 Click Score: 19    End of Session Equipment Utilized During Treatment: Gait belt;Oxygen Activity Tolerance: Patient tolerated treatment well;Patient limited by fatigue Patient left: in chair;with call bell/phone within reach;Other (comment)(let nursing know about his need for bath) Nurse Communication: Mobility status PT Visit Diagnosis: Unsteadiness on feet (R26.81);Muscle weakness (generalized) (M62.81);Difficulty in walking, not elsewhere classified (R26.2)    Time: 1025-1110 PT Time Calculation (min) (ACUTE ONLY): 45 min   Charges:   PT Evaluation $PT Eval Moderate Complexity: 1 Mod PT Treatments $Gait Training: 8-22 mins $Therapeutic Exercise: 8-22 mins       Ramond Dial 03/02/2019, 11:35 AM  Mee Hives, PT MS Acute Rehab Dept. Number: [place] [facility] [place]

## 2019-03-02 NOTE — Progress Notes (Signed)
[place], [place] 03/02/19  Subjective:  Patient seen at bedside. Creatinine currently 2.1. Baseline creatinine 1.65. Breathing comfortably at the moment.   Objective:  Vital signs in last 24 hours:  Temp:  [97.6 F (36.4 C)-98.1 F (36.7 C)] 97.6 F (36.4 C) (06/08 1447) Pulse Rate:  [58-63] 63 (06/08 1447) Resp:  [14-20] 18 (06/08 1447) BP: (95-154)/(53-80) 154/53 (06/08 1447) SpO2:  [87 %-99 %] 97 % (06/08 1447) FiO2 (%):  [68 %] 68 % (06/08 1447) Weight:  [100.6 kg] 100.6 kg (06/08 0454)  Weight change: 0.272 kg Filed Weights   02/28/19 0312 03/01/19 0122 03/02/19 0454  Weight: 99.6 kg 100.3 kg 100.6 kg    Intake/Output:    Intake/Output Summary (Last 24 hours) at 03/02/2019 1652 Last data filed at 03/02/2019 1421 Gross per 24 hour  Intake 1240 ml  Output 1850 ml  Net -610 ml   Physical Exam: General:  Elderly gentleman, sitting up in chair  HEENT  anicteric, moist oral mucous membranes  Neck:  Supple  Lungs:  Mild bilateral crackles, oxygen by nasal cannula  Heart::  S1S2 no rubs  Abdomen:  Obese, soft, nontender  Extremities:  trace edema bilaterally  Neurologic:  Alert, oriented  Skin:  No acute rashes       Basic Metabolic Panel:  Recent Labs  Lab 02/24/19 0459  02/26/19 0632 02/27/19 0509 02/28/19 0558 03/01/19 0420 03/02/19 0447  NA 141   < > 139 137 136 137 142  K 4.1   < > 4.4 5.0 5.1 5.0 4.7  CL 102   < > 100 102 99 100 103  CO2 29   < > 28 23 24 27 28   GLUCOSE 116*   < > 191* 179* 211* 205* 226*  BUN 42*   < > 62* 90* 109* 113* 116*  CREATININE 1.63*   < > 1.92* 2.40* 2.50* 2.38* 2.09*  CALCIUM 8.7*   < > 8.8* 8.6* 8.2* 8.3* 8.3*  MG 2.4  --   --   --   --   --   --    < > = values in this interval not displayed.     CBC: Recent Labs  Lab 02/25/19 0225 02/27/19 0509 02/28/19 0558 03/01/19 0420 03/02/19 0447  WBC 10.8* 22.0* 18.0* 12.4* 11.7*  NEUTROABS  --   --   --  11.6* 10.5*  HGB 7.6*  8.1* 7.8* 7.8* 7.4*  HCT 27.0* 27.0* 25.9* 25.9* 24.9*  MCV 101.5* 97.1 95.6 97.4 96.5  PLT 301 358 360 329 282     No results found for: HEPBSAG, HEPBSAB, HEPBIGM    Microbiology:  Recent Results (from the past 240 hour(s))  SARS Coronavirus 2 (CEPHEID- Performed in [place] hospital lab), Hosp Order     Status: None   Collection Time: 02/23/19  1:05 PM  Result Value Ref Range Status   SARS Coronavirus 2 NEGATIVE NEGATIVE Final    Comment: (NOTE) If result is NEGATIVE SARS-CoV-2 target nucleic acids are NOT DETECTED. The SARS-CoV-2 RNA is generally detectable in upper and lower  respiratory specimens during the acute phase of infection. The lowest  concentration of SARS-CoV-2 viral copies this assay can detect is 250  copies / mL. A negative result does not preclude SARS-CoV-2 infection  and should not be used as the sole basis for treatment or other  patient management decisions.  A negative result may occur with  improper specimen collection / handling, submission of specimen  other  than nasopharyngeal swab, presence of viral mutation(s) within the  areas targeted by this assay, and inadequate number of viral copies  (<250 copies / mL). A negative result must be combined with clinical  observations, patient history, and epidemiological information. If result is POSITIVE SARS-CoV-2 target nucleic acids are DETECTED. The SARS-CoV-2 RNA is generally detectable in upper and lower  respiratory specimens dur ing the acute phase of infection.  Positive  results are indicative of active infection with SARS-CoV-2.  Clinical  correlation with patient history and other diagnostic information is  necessary to determine patient infection status.  Positive results do  not rule out bacterial infection or co-infection with other viruses. If result is PRESUMPTIVE POSTIVE SARS-CoV-2 nucleic acids MAY BE PRESENT.   A presumptive positive result was obtained on the submitted specimen   and confirmed on repeat testing.  While 2019 novel coronavirus  (SARS-CoV-2) nucleic acids may be present in the submitted sample  additional confirmatory testing may be necessary for epidemiological  and / or clinical management purposes  to differentiate between  SARS-CoV-2 and other Sarbecovirus currently known to infect humans.  If clinically indicated additional testing with an alternate test  methodology [phone]) is advised. The SARS-CoV-2 RNA is generally  detectable in upper and lower respiratory sp ecimens during the acute  phase of infection. The expected result is Negative. Fact Sheet for Patients:  StrictlyIdeas.no Fact Sheet for Healthcare Providers: BankingDealers.co.za This test is not yet approved or cleared by the [place] FDA and has been authorized for detection and/or diagnosis of SARS-CoV-2 by FDA under an Emergency Use Authorization (EUA).  This EUA will remain in effect (meaning this test can be used) for the duration of the COVID-19 declaration under Section 564(b)(1) of the Act, 21 U.S.C. section 360bbb-3(b)(1), unless the authorization is terminated or revoked sooner. Performed at [facility], [address] [place], [place] [address]   Blood culture (routine x 2)     Status: None   Collection Time: 02/23/19  1:05 PM  Result Value Ref Range Status   Specimen Description BLOOD LEFT ANTECUBITAL  Final   Special Requests   Final    BOTTLES DRAWN AEROBIC ONLY Blood Culture results may not be optimal due to an inadequate volume of blood received in culture bottles   Culture   Final    NO GROWTH 5 DAYS Performed at [facility], [address] [place], [place] [address]    Report Status 02/28/2019 FINAL  Final  Blood culture (routine x 2)     Status: None   Collection Time: 02/23/19  1:05 PM  Result Value Ref Range Status   Specimen Description BLOOD RIGHT ANTECUBITAL  Final    Special Requests   Final    BOTTLES DRAWN AEROBIC AND ANAEROBIC Blood Culture adequate volume   Culture   Final    NO GROWTH 5 DAYS Performed at [facility], [address] [place], [place] [address]    Report Status 02/28/2019 FINAL  Final  Aerobic Culture (superficial specimen)     Status: None   Collection Time: 02/27/19 10:41 AM  Result Value Ref Range Status   Specimen Description   Final    WOUND LEFT THIGH Performed at [facility], [place] [address] [place], [place] [address]    Special Requests   Final    NONE Performed at [facility], [place], [place] [address]    Gram Stain NO WBC  SEEN NO ORGANISMS SEEN   Final   Culture   Final    NO GROWTH 2 DAYS Performed at [facility], [place] [address] [place], [place] [address]    Report Status 03/01/2019 FINAL  Final    Coagulation Studies: No results for input(s): LABPROT, INR in the last 72 hours.  Urinalysis: No results for input(s): COLORURINE, LABSPEC, PHURINE, GLUCOSEU, HGBUR, BILIRUBINUR, KETONESUR, PROTEINUR, UROBILINOGEN, NITRITE, LEUKOCYTESUR in the last 72 hours.  Invalid input(s): APPERANCEUR    Imaging: Dg Chest Port 1 View  Result Date: 03/01/2019 CLINICAL DATA:  Intermittent hypoxia. EXAM: PORTABLE CHEST 1 VIEW COMPARISON:  02/23/2019 FINDINGS: Left-sided pacemaker unchanged. Lungs are adequately inflated and demonstrate slight improvement and mild hazy perihilar attenuation of the vessels suggesting improving interstitial edema. Persistent small left effusion. Stable moderate cardiomegaly. Remainder the exam is unchanged. IMPRESSION: Findings suggesting slight interval improvement with residual mild vascular congestion and small left effusion. Stable cardiomegaly. Electronically Signed   By: Marin Olp M.D.   On: 03/01/2019 17:26     Medications:   . sodium chloride Stopped (03/02/19 1001)   . sodium chloride   Intravenous Once  . amiodarone  200 mg  Oral Daily  . amoxicillin-clavulanate  500 mg Oral Q12H  . apixaban  5 mg Oral BID  . aspirin  81 mg Oral Daily  . atorvastatin  80 mg Oral Daily  . brimonidine  1 drop Both Eyes TID  . budesonide (PULMICORT) nebulizer solution  0.25 mg Nebulization BID  . Chlorhexidine Gluconate Cloth  6 each Topical Q0600  . cholecalciferol  1,000 Units Oral Daily  . furosemide  40 mg Intravenous BID  . guaiFENesin  600 mg Oral BID  . ipratropium-albuterol  3 mL Nebulization TID  . latanoprost  1 drop Both Eyes QHS  . magnesium oxide  400 mg Oral BID  . metoprolol  200 mg Oral Daily  . mupirocin ointment  1 application Nasal BID  . neomycin-bacitracin-polymyxin   Topical BID  . sodium chloride flush  3 mL Intravenous Q12H  . tiotropium  18 mcg Inhalation Daily  . vitamin B-12  1,000 mcg Oral Daily   sodium chloride, acetaminophen, diphenhydrAMINE, ipratropium-albuterol, nitroGLYCERIN, ondansetron (ZOFRAN) IV, sodium chloride flush  Assessment/ Plan:  73 y.o. African-American male with medical problems of coronary disease, history of PCI 2013, nonischemic dilated cardiomyopathy, AICD 2006, aortic stenosis, Taylor 2018, atrial fibrillation on Eliquis, CKD stage III, COPD with 3 L supplemental oxygen, was admitted on 02/23/2019 with worsening shortness of breath and lower extremity edema.   1.  Acute kidney injury on chronic kidney disease stage III Baseline creatinine 1.65/GFR 47 at the time of admission on June 3rd.  Creatinine peaked at 2.50 and now has started to improve  BUN remains elevated at 116 however this may be related to steroid therapy.  Creatinine is slowly coming down and currently 2.09.  Consider changing IV Lasix to p.o. tomorrow.  2.  Shortness of breath and volume overload with lower extremity edema We will most likely switch the patient to oral diuretics tomorrow.  LOS: 7 Jalessa Peyser 6/8/20204:52 PM  [place], [place]  Note:  This note was prepared with Dragon dictation. Any transcription errors are unintentional

## 2019-03-02 NOTE — TOC Progression Note (Signed)
Transition of Care [facility]) - Progression Note    Patient Details  Name: Darius Taylor MRN: [id number] Date of Birth: March 10, 1946  Transition of Care [facility]) CM/SW Contact  Elza Rafter, RN Phone Number: 03/02/2019, 10:21 AM  Clinical Narrative:   Tiajuana Amass to DC-Sats dropped to 72% on 5L; now on 10L.  IV Lasix 40mg  BID. Continues on Duo nebs every 6hrs.    Expected Discharge Plan: [place] Barriers to Discharge: Continued Medical Work up  Expected Discharge Plan and Services Expected Discharge Plan: [place]   Discharge Planning Services: CM Consult Post Acute Care Choice: Home Health                             HH Arranged: RN, PT, OT, Nurse's Aide [place] Agency: [facility] (now [facility]) Date [place]: 02/26/19 Time [place]: [phone] Representative spoke with at [place]: [place] ([place]) Interventions    Readmission Risk Interventions Readmission Risk Prevention Plan 02/26/2019 01/26/2019 01/21/2019  Transportation Screening Complete Complete Complete  Medication Review Press photographer) Complete Complete Complete  PCP or Specialist appointment within 3-5 days of discharge Complete Complete Complete  HRI or Home Care Consult Complete Complete Complete  SW Recovery Care/Counseling Consult Not Complete Not Complete -  SW Consult Not Complete Comments NA N/A -  Palliative Care Screening Not Applicable Not Applicable Not [place] Not Applicable Not Applicable Not Applicable  Some recent data might be hidden

## 2019-03-03 DIAGNOSIS — Z515 Encounter for palliative care: Secondary | ICD-10-CM

## 2019-03-03 DIAGNOSIS — I509 Heart failure, unspecified: Secondary | ICD-10-CM

## 2019-03-03 DIAGNOSIS — Z7189 Other specified counseling: Secondary | ICD-10-CM

## 2019-03-03 LAB — BASIC METABOLIC PANEL
Anion gap: 8 (ref 5–15)
BUN: 96 mg/dL — ABNORMAL HIGH (ref 8–23)
CO2: 29 mmol/L (ref 22–32)
Calcium: 8.3 mg/dL — ABNORMAL LOW (ref 8.9–10.3)
Chloride: 104 mmol/L (ref 98–111)
Creatinine, Ser: 1.76 mg/dL — ABNORMAL HIGH (ref 0.61–1.24)
GFR calc Af Amer: 43 mL/min — ABNORMAL LOW (ref >60)
GFR calc non Af Amer: 37 mL/min — ABNORMAL LOW (ref >60)
Glucose, Bld: 154 mg/dL — ABNORMAL HIGH (ref 70–99)
Potassium: 4.3 mmol/L (ref 3.5–5.1)
Sodium: 141 mmol/L (ref 135–145)

## 2019-03-03 LAB — CBC WITH DIFFERENTIAL/PLATELET
Abs Immature Granulocytes: 0.13 10*3/uL — ABNORMAL HIGH (ref 0.00–0.07)
Basophils Absolute: 0 10*3/uL (ref 0.0–0.1)
Basophils Relative: 0 %
Eosinophils Absolute: 0 10*3/uL (ref 0.0–0.5)
Eosinophils Relative: 0 %
HCT: 27 % — ABNORMAL LOW (ref 39.0–52.0)
Hemoglobin: 8.3 g/dL — ABNORMAL LOW (ref 13.0–17.0)
Immature Granulocytes: 1 %
Lymphocytes Relative: 8 %
Lymphs Abs: 1 10*3/uL (ref 0.7–4.0)
MCH: 28.7 pg (ref 26.0–34.0)
MCHC: 30.7 g/dL (ref 30.0–36.0)
MCV: 93.4 fL (ref 80.0–100.0)
Monocytes Absolute: 2.1 10*3/uL — ABNORMAL HIGH (ref 0.1–1.0)
Monocytes Relative: 16 %
Neutro Abs: 9.9 10*3/uL — ABNORMAL HIGH (ref 1.7–7.7)
Neutrophils Relative %: 75 %
Platelets: 252 10*3/uL (ref 150–400)
RBC: 2.89 MIL/uL — ABNORMAL LOW (ref 4.22–5.81)
RDW: 15.9 % — ABNORMAL HIGH (ref 11.5–15.5)
WBC: 13.2 10*3/uL — ABNORMAL HIGH (ref 4.0–10.5)
nRBC: 0.6 % — ABNORMAL HIGH (ref 0.0–0.2)

## 2019-03-03 MED ORDER — TORSEMIDE 20 MG PO TABS
20.0000 mg | ORAL_TABLET | Freq: Two times a day (BID) | ORAL | Status: DC
  Administered 2019-03-03 – 2019-03-05 (×5): 20 mg via ORAL
  Filled 2019-03-03 (×5): qty 1

## 2019-03-03 NOTE — Progress Notes (Signed)
[facility], [place] 03/03/19  Subjective:  Patient appears to be doing well. Kidney function improving. BUN currently 96 with a creatinine 1.76. Patient transition to torsemide this a.m.   Objective:  Vital signs in last 24 hours:  Temp:  [97.6 F (36.4 C)-98.4 F (36.9 C)] 98.3 F (36.8 C) (06/09 0814) Pulse Rate:  [60-67] 60 (06/09 0814) Resp:  [18-22] 20 (06/09 0423) BP: (120-157)/(52-92) 152/52 (06/09 0814) SpO2:  [87 %-99 %] 97 % (06/09 1350) FiO2 (%):  [68 %] 68 % (06/08 1447) Weight:  [98.2 kg] 98.2 kg (06/09 0437)  Weight change: -2.408 kg Filed Weights   03/01/19 0122 03/02/19 0454 03/03/19 0437  Weight: 100.3 kg 100.6 kg 98.2 kg    Intake/Output:    Intake/Output Summary (Last 24 hours) at 03/03/2019 1431 Last data filed at 03/03/2019 1105 Gross per 24 hour  Intake 620 ml  Output 2275 ml  Net -1655 ml   Physical Exam: General:  Elderly gentleman, sitting up in chair  HEENT  anicteric, moist oral mucous membranes  Neck:  Supple  Lungs:  Minimal basilar rales  Heart::  S1S2 no rubs  Abdomen:  Obese, soft, nontender  Extremities:  trace edema bilaterally  Neurologic:  Alert, oriented  Skin:  No acute rashes       Basic Metabolic Panel:  Recent Labs  Lab 02/27/19 0509 02/28/19 0558 03/01/19 0420 03/02/19 0447 03/03/19 0628  NA 137 136 137 142 141  K 5.0 5.1 5.0 4.7 4.3  CL 102 99 100 103 104  CO2 23 24 27 28 29   GLUCOSE 179* 211* 205* 226* 154*  BUN 90* 109* 113* 116* 96*  CREATININE 2.40* 2.50* 2.38* 2.09* 1.76*  CALCIUM 8.6* 8.2* 8.3* 8.3* 8.3*     CBC: Recent Labs  Lab 02/27/19 0509 02/28/19 0558 03/01/19 0420 03/02/19 0447 03/02/19 2012 03/03/19 0628  WBC 22.0* 18.0* 12.4* 11.7*  --  13.2*  NEUTROABS  --   --  11.6* 10.5*  --  9.9*  HGB 8.1* 7.8* 7.8* 7.4* 8.6* 8.3*  HCT 27.0* 25.9* 25.9* 24.9* 28.5* 27.0*  MCV 97.1 95.6 97.4 96.5  --  93.4  PLT 358 360 329 282  --  252     No results found  for: HEPBSAG, HEPBSAB, HEPBIGM    Microbiology:  Recent Results (from the past 240 hour(s))  SARS Coronavirus 2 (CEPHEID- Performed in [place] hospital lab), Hosp Order     Status: None   Collection Time: 02/23/19  1:05 PM  Result Value Ref Range Status   SARS Coronavirus 2 NEGATIVE NEGATIVE Final    Comment: (NOTE) If result is NEGATIVE SARS-CoV-2 target nucleic acids are NOT DETECTED. The SARS-CoV-2 RNA is generally detectable in upper and lower  respiratory specimens during the acute phase of infection. The lowest  concentration of SARS-CoV-2 viral copies this assay can detect is 250  copies / mL. A negative result does not preclude SARS-CoV-2 infection  and should not be used as the sole basis for treatment or other  patient management decisions.  A negative result may occur with  improper specimen collection / handling, submission of specimen other  than nasopharyngeal swab, presence of viral mutation(s) within the  areas targeted by this assay, and inadequate number of viral copies  (<250 copies / mL). A negative result must be combined with clinical  observations, patient history, and epidemiological information. If result is POSITIVE SARS-CoV-2 target nucleic acids are DETECTED. The SARS-CoV-2 RNA is generally  detectable in upper and lower  respiratory specimens dur ing the acute phase of infection.  Positive  results are indicative of active infection with SARS-CoV-2.  Clinical  correlation with patient history and other diagnostic information is  necessary to determine patient infection status.  Positive results do  not rule out bacterial infection or co-infection with other viruses. If result is PRESUMPTIVE POSTIVE SARS-CoV-2 nucleic acids MAY BE PRESENT.   A presumptive positive result was obtained on the submitted specimen  and confirmed on repeat testing.  While 2019 novel coronavirus  (SARS-CoV-2) nucleic acids may be present in the submitted sample   additional confirmatory testing may be necessary for epidemiological  and / or clinical management purposes  to differentiate between  SARS-CoV-2 and other Sarbecovirus currently known to infect humans.  If clinically indicated additional testing with an alternate test  methodology [phone]) is advised. The SARS-CoV-2 RNA is generally  detectable in upper and lower respiratory sp ecimens during the acute  phase of infection. The expected result is Negative. Fact Sheet for Patients:  StrictlyIdeas.no Fact Sheet for Healthcare Providers: BankingDealers.co.za This test is not yet approved or cleared by the [place] FDA and has been authorized for detection and/or diagnosis of SARS-CoV-2 by FDA under an Emergency Use Authorization (EUA).  This EUA will remain in effect (meaning this test can be used) for the duration of the COVID-19 declaration under Section 564(b)(1) of the Act, 21 U.S.C. section 360bbb-3(b)(1), unless the authorization is terminated or revoked sooner. Performed at [facility], [address] [place], [place] [address]   Blood culture (routine x 2)     Status: None   Collection Time: 02/23/19  1:05 PM  Result Value Ref Range Status   Specimen Description BLOOD LEFT ANTECUBITAL  Final   Special Requests   Final    BOTTLES DRAWN AEROBIC ONLY Blood Culture results may not be optimal due to an inadequate volume of blood received in culture bottles   Culture   Final    NO GROWTH 5 DAYS Performed at [facility], [address] [place], [place] [address]    Report Status 02/28/2019 FINAL  Final  Blood culture (routine x 2)     Status: None   Collection Time: 02/23/19  1:05 PM  Result Value Ref Range Status   Specimen Description BLOOD RIGHT ANTECUBITAL  Final   Special Requests   Final    BOTTLES DRAWN AEROBIC AND ANAEROBIC Blood Culture adequate volume   Culture   Final    NO GROWTH 5  DAYS Performed at [facility], [address] [place], [place] [address]    Report Status 02/28/2019 FINAL  Final  Aerobic Culture (superficial specimen)     Status: None   Collection Time: 02/27/19 10:41 AM  Result Value Ref Range Status   Specimen Description   Final    WOUND LEFT THIGH Performed at [facility], [place] [address], [place], [place] [address]    Special Requests   Final    NONE Performed at [facility], [place], [place] [address]    Gram Stain NO WBC SEEN NO ORGANISMS SEEN   Final   Culture   Final    NO GROWTH 2 DAYS Performed at [facility], [place] [address] [place],  [address]    Report Status 03/01/2019 FINAL  Final    Coagulation Studies: No results for input(s): LABPROT, INR in the last 72 hours.  Urinalysis: No results for input(s): COLORURINE, LABSPEC, PHURINE, GLUCOSEU, HGBUR, BILIRUBINUR, KETONESUR, PROTEINUR, UROBILINOGEN, NITRITE, LEUKOCYTESUR in the last 72 hours.  Invalid input(s): APPERANCEUR    Imaging: Dg Chest Port 1 View  Result Date: 03/01/2019 CLINICAL DATA:  Intermittent hypoxia. EXAM: PORTABLE CHEST 1 VIEW COMPARISON:  02/23/2019 FINDINGS: Left-sided pacemaker unchanged. Lungs are adequately inflated and demonstrate slight improvement and mild hazy perihilar attenuation of the vessels suggesting improving interstitial edema. Persistent small left effusion. Stable moderate cardiomegaly. Remainder the exam is unchanged. IMPRESSION: Findings suggesting slight interval improvement with residual mild vascular congestion and small left effusion. Stable cardiomegaly. Electronically Signed   By: Marin Olp M.D.   On: 03/01/2019 17:26     Medications:   . sodium chloride Stopped (03/02/19 1001)   . sodium chloride   Intravenous Once  . amiodarone  200 mg Oral Daily  . amoxicillin-clavulanate  500 mg Oral Q12H  . apixaban  5 mg Oral BID  . aspirin  81 mg Oral Daily  .  atorvastatin  80 mg Oral Daily  . brimonidine  1 drop Both Eyes TID  . budesonide (PULMICORT) nebulizer solution  0.25 mg Nebulization BID  . Chlorhexidine Gluconate Cloth  6 each Topical Q0600  . cholecalciferol  1,000 Units Oral Daily  . guaiFENesin  600 mg Oral BID  . ipratropium-albuterol  3 mL Nebulization TID  . latanoprost  1 drop Both Eyes QHS  . magnesium oxide  400 mg Oral BID  . metoprolol  200 mg Oral Daily  . mupirocin ointment  1 application Nasal BID  . neomycin-bacitracin-polymyxin   Topical BID  . sodium chloride flush  3 mL Intravenous Q12H  . tiotropium  18 mcg Inhalation Daily  . torsemide  20 mg Oral BID  . vitamin B-12  1,000 mcg Oral Daily   sodium chloride, acetaminophen, diphenhydrAMINE, ipratropium-albuterol, nitroGLYCERIN, ondansetron (ZOFRAN) IV, sodium chloride flush  Assessment/ Plan:  73 y.o. African-American male with medical problems of coronary disease, history of PCI 2013, nonischemic dilated cardiomyopathy, AICD 2006, aortic stenosis, Taylor 2018, atrial fibrillation on Eliquis, CKD stage III, COPD with 3 L supplemental oxygen, was admitted on 02/23/2019 with worsening shortness of breath and lower extremity edema.   1.  Acute kidney injury on chronic kidney disease stage III Baseline creatinine 1.65/GFR 47 at the time of admission on June 3rd.  Creatinine peaked at 2.50 and continues to improve daily.  BUN currently 96 with a creatinine 1.76.  Continue to monitor renal parameters daily for now.  2.  Shortness of breath and volume overload with lower extremity edema IV furosemide discontinued and patient transition to torsemide 20 mg twice daily.   LOS: 8 Arion Morgan 6/9/20202:31 PM  [place], [place]  Note: This note was prepared with Dragon dictation. Any transcription errors are unintentional

## 2019-03-03 NOTE — Progress Notes (Signed)
[place] [facility] [place] NAME: Darius Taylor    MR#:  [id number]  DATE OF BIRTH:  October 07, 1945  SUBJECTIVE:   Chief Complaint  Patient presents with  . Shortness of Breath   Patient seen at the bedside. Overall he feels he is doing better despite being on high flow oxygen. Remains SOB worse with exertion.  REVIEW OF SYSTEMS:  Review of Systems  Constitutional: Negative for chills, fever, malaise/fatigue and weight loss.  HENT: Negative for congestion, hearing loss and sore throat.   Eyes: Negative for blurred vision and double vision.  Respiratory: Positive for shortness of breath. Negative for cough and wheezing.   Cardiovascular: Positive for leg swelling. Negative for chest pain, palpitations and orthopnea.  Gastrointestinal: Negative for abdominal pain, diarrhea, nausea and vomiting.  Genitourinary: Negative for dysuria and urgency.  Musculoskeletal: Negative for myalgias.  Skin: Negative for rash.  Neurological: Negative for dizziness, sensory change, speech change, focal weakness and headaches.  Psychiatric/Behavioral: Negative for depression.   DRUG ALLERGIES:  No Known Allergies VITALS:  Blood pressure (!) 152/52, pulse 60, temperature 98.3 F (36.8 C), resp. rate 20, height 5\' 11"  (1.803 m), weight 98.2 kg, SpO2 97 %. PHYSICAL EXAMINATION:   GENERAL:  73 y.o.-year-old patient lying in the bed with no acute distress.  EYES: Pupils equal, round, reactive to light and accommodation. No scleral icterus. Extraocular muscles intact.  HEENT: Head atraumatic, normocephalic. Oropharynx and nasopharynx clear.  NECK:  Supple, no jugular venous distention. No thyroid enlargement, no tenderness.  LUNGS: Normal breath sounds bilaterally, no wheezing, rales,rhonchi or crepitation. No use of accessory muscles of respiration.  CARDIOVASCULAR: S1, S2 normal. No murmurs, rubs, or gallops.  ABDOMEN: Soft, nontender, nondistended. Bowel sounds  present. No organomegaly or mass.  EXTREMITIES: No pedal edema, cyanosis, or clubbing. No rash or lesions. + pedal pulses MUSCULOSKELETAL: Normal bulk, and power was 5+ grip and elbow, knee, and ankle flexion and extension bilaterally.  NEUROLOGIC:Alert and oriented x 3. CN 2-12 intact. Sensation to light touch and cold stimuli intact bilaterally. Finger to nose nl. Babinski is downgoing. DTR's (biceps, patellar, and achilles) 2+ and symmetric throughout. Gait not tested due to safety concern. PSYCHIATRIC: The patient is alert and oriented x 3.  SKIN: Left thigh cellulitis improving  DATA REVIEWED:  LABORATORY PANEL:  Male CBC Recent Labs  Lab 03/03/19 0628  WBC 13.2*  HGB 8.3*  HCT 27.0*  PLT 252   ------------------------------------------------------------------------------------------------------------------ Chemistries  Recent Labs  Lab 03/03/19 0628  NA 141  K 4.3  CL 104  CO2 29  GLUCOSE 154*  BUN 96*  CREATININE 1.76*  CALCIUM 8.3*   RADIOLOGY:  Dg Chest Port 1 View  Result Date: 03/01/2019 CLINICAL DATA:  Intermittent hypoxia. EXAM: PORTABLE CHEST 1 VIEW COMPARISON:  02/23/2019 FINDINGS: Left-sided pacemaker unchanged. Lungs are adequately inflated and demonstrate slight improvement and mild hazy perihilar attenuation of the vessels suggesting improving interstitial edema. Persistent small left effusion. Stable moderate cardiomegaly. Remainder the exam is unchanged. IMPRESSION: Findings suggesting slight interval improvement with residual mild vascular congestion and small left effusion. Stable cardiomegaly. Electronically Signed   By: Marin Olp M.D.   On: 03/01/2019 17:26   ASSESSMENT AND PLAN:   73 y.o. male with history of chronic atrial fibrillation on anticoagulation with Eliquis,chronic kidney disease stage III,COPD,prior history of AICD,chronic diastolic CHF and prior history of asthma admitted with acute hypoxic respiratory failure secondary to CHF  exacerbation.  1.Acute hypoxic respiratory failure  secondary to CHF exacerbation. - Still requires high flow oxygen. Wean oxygen as tolerated - Continue nebs   2.Acute on chronic diastolic CHF exacerbation.  - Last Echo 10/2018 with  EF 50-55% - Continue Lorstan - Beta-Blockade: Metoprolol - Diuretics:Torsemide po. Diureses >1L negative per day until approach euvolemia / worsening renal function. - Low salt diet  - Strict I&Os - Fluid restriction - Cardiology input appreciated  3. Advanced COPD - Duo nebs every 6 - Supplemental oxygen as needed - Palliative following  4. Chronic Anemia-Irons studies suggested iron deficiency with no acute bleeding.  - received 1 dose of IV Venofer, and I unit of PRBC on 01/2019 and 6/9 -  hemoglobin 8.3; - Continue to monitor H&H  5.  Left thigh Cellulitis - no worsening symptoms - Augmentin - Leukocytosis likely due to steroid now improving  6.Chronic atrial fibrillation -rate controlled on Amiodarone + Metoprolol - Continue Eliquis  7.Acute chronic kidney disease stage III - slight improvement in creatine today - Avoid nephrotoxins - Continue to monitor renal function - Nephrology following  8.Mildly elevated troponin of 0.08 -likely due to supply demand ischemia from CHF  All the records are reviewed and case discussed with Care Management/Social Worker. Management plans discussed with the patient, family and they are in agreement.  CODE STATUS: Full Code  TOTAL TIME TAKING CARE OF THIS PATIENT: 38 minutes.   More than 50% of the time was spent in counseling/coordination of care: YES  POSSIBLE D/C IN 1 DAYS, DEPENDING ON CLINICAL CONDITION.    03/03/2019 at 2:48 PM  This patient was staffed with Dr. Valetta Fuller, [facility] who personally evaluated patient, reviewed documentation and agreed with assessment and plan of care as above.  Rufina Falco, DNP, FNP-BC [facility] Nurse Practitioner   Between 7am to 6pm -  Pager [phone]  After 6pm go to [email or web address] - Technical brewer [facility]  Office  [phone]  CC: Primary care physician; [employer]  Note: This dictation was prepared with Diplomatic Services operational officer dictation along with smaller Company secretary. Any transcriptional errors that result from this process are unintentional.

## 2019-03-03 NOTE — Progress Notes (Signed)
Pulmonary Medicine          Date: 03/03/2019,   MRN# [id number] Darius Taylor Feb 21, 1946     AdmissionWeight: 97.5 kg                 CurrentWeight: 98.2 kg         SUBJECTIVE    Patient is sitting up in chair watching TV, he is smiling and is in good spirits today, reports decreased shortness of breath.  He is approximately 8 L net negative however unfortunately remains on high flow nasal cannula 8-10 L/min although I think this can be weaned down a bit as he was satting 98% during evaluation.  There is still 2+ pitting edema although left leg DVT has caused him to have chronic left lower extremity swelling despite euvolemia.    PAST MEDICAL HISTORY   Past Medical History:  Diagnosis Date  . Aortic stenosis   . Arrhythmia    atrial fibrillation  . Asthma   . Atrial fibrillation ([place])   . CAD (coronary artery disease)   . Cancer [facility])    colon, bladder and kidney  . CHF (congestive heart failure) ([place])   . 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 COPD (chronic obstructive pulmonary disease) ([place])   . H/O ventricular tachycardia   . Hypertension   . Nonischemic cardiomyopathy ([place])    Status post AICD and pacemaker  . Renal cell carcinoma ([place])      SURGICAL HISTORY   Past Surgical History:  Procedure Laterality Date  . CARDIAC DEFIBRILLATOR PLACEMENT    . CARDIAC DEFIBRILLATOR PLACEMENT    . COLONOSCOPY WITH PROPOFOL N/A 06/10/2018   Procedure: COLONOSCOPY WITH PROPOFOL;  Surgeon: Lin Landsman, MD;  Location[facility] ENDOSCOPY;  Service: Gastroenterology;  Laterality: N/A;  . CORONARY ANGIOPLASTY  09/25/2016  . IRRIGATION AND DEBRIDEMENT HEMATOMA Left 10/24/2016   Procedure: IRRIGATION AND DEBRIDEMENT HEMATOMA;  Surgeon: Florene Glen, MD;  Location: [facility] ORS;  Service: General;  Laterality: Left;  . KIDNEY SURGERY     left ne[hrectomy for RCC  . PACEMAKER IMPLANT    . PARTIAL COLECTOMY       FAMILY HISTORY   Family History  Problem Relation  Age of Onset  . Hypertension Mother   . Stroke Father      SOCIAL HISTORY   Social History   Tobacco Use  . Smoking status: Former Research scientist (life sciences)  . Smokeless tobacco: Never Used  Substance Use Topics  . Alcohol use: No  . Drug use: No     MEDICATIONS    Home Medication:    Current Medication:  Current Facility-Administered Medications:  .  0.9 %  sodium chloride infusion (Manually program via Guardrails IV Fluids), , Intravenous, Once, [place], Bing Neighbors, NP .  0.9 %  sodium chloride infusion, 250 mL, Intravenous, PRN, Stark Jock, Jude, MD, Stopped at 03/02/19 1001 .  acetaminophen (TYLENOL) tablet 650 mg, 650 mg, Oral, Q4H PRN, Stark Jock, Jude, MD, 650 mg at 02/28/19 2207 .  amiodarone (PACERONE) tablet 200 mg, 200 mg, Oral, Daily, Ojie, Jude, MD, 200 mg at 03/03/19 0905 .  amoxicillin-clavulanate (AUGMENTIN) 500-125 MG per tablet 500 mg, 500 mg, Oral, Q12H, Mayo, Pete Pelt, MD, 500 mg at 03/03/19 0905 .  apixaban (ELIQUIS) tablet 5 mg, 5 mg, Oral, BID, Ojie, Jude, MD, 5 mg at 03/03/19 0905 .  aspirin chewable tablet 81 mg, 81 mg, Oral, Daily, Ojie, Jude, MD, 81 mg at 03/03/19 0905 .  atorvastatin (LIPITOR)  tablet 80 mg, 80 mg, Oral, Daily, Ojie, Jude, MD, 80 mg at 03/03/19 0905 .  brimonidine (ALPHAGAN) 0.15 % ophthalmic solution 1 drop, 1 drop, Both Eyes, TID, Ojie, Jude, MD, 1 drop at 03/03/19 0904 .  budesonide (PULMICORT) nebulizer solution 0.25 mg, 0.25 mg, Nebulization, BID, Dustin Flock, MD, 0.25 mg at 03/03/19 0839 .  Chlorhexidine Gluconate Cloth 2 % PADS 6 each, 6 each, Topical, Q0600, Dustin Flock, MD, 6 each at 03/02/19 [phone] .  cholecalciferol (VITAMIN D) tablet 1,000 Units, 1,000 Units, Oral, Daily, Ojie, Jude, MD, 1,000 Units at 03/03/19 0905 .  diphenhydrAMINE (BENADRYL) capsule 25 mg, 25 mg, Oral, QHS PRN, Lance Coon, MD, 25 mg at 02/28/19 2207 .  guaiFENesin (MUCINEX) 12 hr tablet 600 mg, 600 mg, Oral, BID, Dustin Flock, MD, 600 mg at 03/03/19 0905 .   ipratropium-albuterol (DUONEB) 0.5-2.5 (3) MG/3ML nebulizer solution 3 mL, 3 mL, Nebulization, TID, Dustin Flock, MD, 3 mL at 03/03/19 1350 .  ipratropium-albuterol (DUONEB) 0.5-2.5 (3) MG/3ML nebulizer solution 3 mL, 3 mL, Nebulization, Q4H PRN, Dustin Flock, MD, 3 mL at 03/01/19 1640 .  latanoprost (XALATAN) 0.005 % ophthalmic solution 1 drop, 1 drop, Both Eyes, QHS, Ojie, Jude, MD, 1 drop at 03/02/19 2129 .  magnesium oxide (MAG-OX) tablet 400 mg, 400 mg, Oral, BID, Ojie, Jude, MD, 400 mg at 03/03/19 0905 .  metoprolol succinate (TOPROL-XL) 24 hr tablet 200 mg, 200 mg, Oral, Daily, Ojie, Jude, MD, 200 mg at 03/03/19 0905 .  mupirocin ointment (BACTROBAN) 2 % 1 application, 1 application, Nasal, BID, Dustin Flock, MD, 1 application at 92/11/94 0906 .  neomycin-bacitracin-polymyxin (NEOSPORIN) ointment, , Topical, BID, Dustin Flock, MD .  nitroGLYCERIN (NITROSTAT) SL tablet 0.4 mg, 0.4 mg, Sublingual, Q5 min PRN, Ojie, Jude, MD .  ondansetron (ZOFRAN) injection 4 mg, 4 mg, Intravenous, Q6H PRN, Ojie, Jude, MD .  sodium chloride flush (NS) 0.9 % injection 3 mL, 3 mL, Intravenous, Q12H, Ojie, Jude, MD, 3 mL at 03/03/19 0906 .  sodium chloride flush (NS) 0.9 % injection 3 mL, 3 mL, Intravenous, PRN, Ojie, Jude, MD .  tiotropium (SPIRIVA) inhalation capsule ([facility] use ONLY) 18 mcg, 18 mcg, Inhalation, Daily, Ojie, Jude, MD, 18 mcg at 03/03/19 0905 .  torsemide (DEMADEX) tablet 20 mg, 20 mg, Oral, BID, Lateef, Munsoor, MD .  vitamin B-12 (CYANOCOBALAMIN) tablet 1,000 mcg, 1,000 mcg, Oral, Daily, Dustin Flock, MD, 1,000 mcg at 03/03/19 1740    ALLERGIES   Patient has no known allergies.     REVIEW OF SYSTEMS    Review of Systems:  Gen:  Denies  fever, sweats, chills weigh loss  HEENT: Denies blurred vision, double vision, ear pain, eye pain, hearing loss, nose bleeds, sore throat Cardiac:  No dizziness, chest pain or heaviness, chest tightness,edema Resp:   Denies cough or  sputum porduction, shortness of breath,wheezing, hemoptysis,  Gi: Denies swallowing difficulty, stomach pain, nausea or vomiting, diarrhea, constipation, bowel incontinence Gu:  Denies bladder incontinence, burning urine Ext:   Denies Joint pain, stiffness or swelling Skin: Denies  skin rash, easy bruising or bleeding or hives Endoc:  Denies polyuria, polydipsia , polyphagia or weight change Psych:   Denies depression, insomnia or hallucinations   Other:  All other systems negative   VS: BP (!) 152/52 (BP Location: Left Arm)   Pulse 60   Temp 98.3 F (36.8 C)   Resp 20   Ht 5\' 11"  (1.803 m)   Wt 98.2 kg   SpO2 97%  BMI 30.19 kg/m      PHYSICAL EXAM    GENERAL:NAD, no fevers, chills, no weakness no fatigue HEAD: Normocephalic, atraumatic.  EYES: Pupils equal, round, reactive to light. Extraocular muscles intact. No scleral icterus.  MOUTH: Moist mucosal membrane. Dentition intact. No abscess noted.  EAR, NOSE, THROAT: Clear without exudates. No external lesions.  NECK: Supple. No thyromegaly. No nodules. No JVD.  PULMONARY:mild crackles bilaterally no rhonchi or wheezing  CARDIOVASCULAR: S1 and S2. Regular rate and rhythm. No murmurs, rubs, or gallops. No edema. Pedal pulses 2+ bilaterally.  GASTROINTESTINAL: Soft, nontender, nondistended. No masses. Positive bowel sounds. No hepatosplenomegaly.  MUSCULOSKELETAL: No swelling, clubbing, or edema. Range of motion full in all extremities.  NEUROLOGIC: Cranial nerves II through XII are intact. No gross focal neurological deficits. Sensation intact. Reflexes intact.  SKIN: No ulceration, lesions, rashes, or cyanosis. Skin warm and dry. Turgor intact.  PSYCHIATRIC: Mood, affect within normal limits. The patient is awake, alert and oriented x 3. Insight, judgment intact.       IMAGING    Dg Chest Port 1 View  Result Date: 03/01/2019 CLINICAL DATA:  Intermittent hypoxia. EXAM: PORTABLE CHEST 1 VIEW COMPARISON:  02/23/2019  FINDINGS: Left-sided pacemaker unchanged. Lungs are adequately inflated and demonstrate slight improvement and mild hazy perihilar attenuation of the vessels suggesting improving interstitial edema. Persistent small left effusion. Stable moderate cardiomegaly. Remainder the exam is unchanged. IMPRESSION: Findings suggesting slight interval improvement with residual mild vascular congestion and small left effusion. Stable cardiomegaly. Electronically Signed   By: Marin Olp M.D.   On: 03/01/2019 17:26   Dg Chest Port 1 View  Result Date: 02/23/2019 CLINICAL DATA:  Shortness of breath EXAM: PORTABLE CHEST 1 VIEW COMPARISON:  Jan 28, 2019 and January 09, 2019 FINDINGS: There is interstitial edema throughout the lungs, essentially stable. There are small pleural effusions bilaterally. There is no frank airspace consolidation. There is cardiomegaly with pulmonary venous hypertension. Pacemaker leads are attached to the right atrium and right ventricle. There is aortic atherosclerosis. No adenopathy. No bone lesions. IMPRESSION: Pulmonary vascular congestion with small pleural effusions and interstitial edema. Appearance is felt to be indicative of a degree of congestive heart failure. There may well be superimposed noncardiogenic interstitial pneumonitis, although there is no frank airspace consolidation. Pacemaker leads attached to right atrium and right ventricle. Aortic Atherosclerosis (ICD10-I70.0). Electronically Signed   By: Lowella Grip III M.D.   On: 02/23/2019 13:25       ASSESSMENT/PLAN   Acute on chronic hypoxemic respiratory failure due to Acute on chronic systolic and diastolic decompensated CHF with EF 50 to 18% complicated by advanced COPD with chronic hypoxemia, severe anemia and possible pulmonary hypertension suggested by severely elevated RVSP most recent echo -Agree with diuresis- nephro on case s/p PRBC transfusion -  -Cardiology on case appreciate input -Strict I's and O's  -Fluid intakerestirction [id number] Complicated by CKD - renal team on case - appreciate input-torsemide 20 bid    Advanced COPD -continue COPD carepath, will d/c pulmicort.  Continue duoneb q6 while awake -IS at bedsie, ordering MetaNeb to help with recruitment -[employer]ella       Thank you for allowing me to participate in the care of this patient.     Patient/Family are satisfied with care plan and all questions have been answered.  This document was prepared using Dragon voice recognition software and may include unintentional dictation errors.     Ottie Glazier, M.D.  Division of Pulmonary & Critical Care Medicine  [place]

## 2019-03-03 NOTE — Consult Note (Signed)
Consultation Note Date: 03/03/2019   Patient Name: Darius Taylor  DOB: January 11, 1946  MRN: [id number]  Age / Sex: 73 y.o., male  PCP: [facility] [place] Referring Physician: Mayo, Pete Pelt, MD  Reason for Consultation: Establishing goals of care  HPI/Patient Profile: 73 y.o. male  with past medical history of diastolic CHF (EF 93-71% 6/96/78), atrial fibrillation on Eliquis, s/p AICD, aortic stenosis s/p TAVR Dec 12, 2016, HTN, COPD, CKD stage 3, h/o renal cell carcinoma (s/p left nephrectomy 12/13/98), h/o colon adenocarcinoma (colectomy Dec 13, 2014), admitted on 02/23/2019 with SOB with acute hypoxic respiratory failure with CHF and COPD exacerbation along with declining renal function and anemia requiring transfusion. Over course of hospitalization he has had minimal to no improvement and is currently requiring 10L oxygen (3L at home). He is also noted to have 5 admissions over the past 6 months d/t mostly respiratory failure (COPD/CHF) and renal failure.   Clinical Assessment and Goals of Care: I met today with Mr. Rivero who is a pleasant gentleman sitting up in recliner and eating his lunch. Appetite appears to be good. He is able to eat and talk with me without SOB on 8L oxygen. He tells me that he lives independently but his daughter lives next door and helps with his meals and some house work but he does most things himself. He drives himself to his appointments and walks out in the yard to visit with neighbors. He says that he was able to do all this just prior to admission. He says that he is limited by SOB but knows he just needs to take his time and he is fine.   He has 2 daughters (1 is in [place]) and multiple granddaughters. His wife died ~2 years ago and was on dialysis and had cancer. His son died 12-12-2008  And upon further discussion he witnessed his son being resuscitated so he understands what this means. We  discussed his wishes and he clearly tells me that he knows he will not live forever but wants everything done - "it's okay if it doesn't work." He would not want to live on machines but insists that he wants everything done even though he says he does not want to suffer. He tells me he knows he will go to heaven but he wants to live as long as he can to be here with his children and family. We discussed the burden and suffering associated with CPR and intubation but he insists he wishes to be full code but has peace if this is unsuccessful. I also told him that with his chronic health condition CPR is not expected to be successful.   At this time he remains hopeful as he "feels good" and believes he still has good QOL. He is hopeful to return home and has been reluctant to consider even short-term rehab in the past (there are phone messages from daughter indicating she believed he should have gone to rehab). He continues to hope he can eventually return home. I encouraged him to consider  his decisions and his QOL. He knows that options are limited to help him improve at this point. All questions/concerns addressed. Emotional support provided.   Primary Decision Maker PATIENT    SUMMARY OF RECOMMENDATIONS   - Full aggressive care desired  Code Status/Advance Care Planning:  Full code   Symptom Management:   Per primary, pulmonology, nephrology.   Palliative Prophylaxis:   Aspiration, Bowel Regimen, Delirium Protocol and Frequent Pain Assessment  Additional Recommendations (Limitations, Scope, Preferences):  Full Scope Treatment  Psycho-social/Spiritual:   Desire for further Chaplaincy support:yes  Additional Recommendations: Caregiving  Support/Resources  Prognosis:   Overall poor prognosis with chronic multiorgan dysfunction.   Discharge Planning: TBD      Primary Diagnoses: Present on Admission: . Acute on chronic diastolic CHF (congestive heart failure) ([place])   I have  reviewed the medical record, interviewed the patient and family, and examined the patient. The following aspects are pertinent.  Past Medical History:  Diagnosis Date  . Aortic stenosis   . Arrhythmia    atrial fibrillation  . Asthma   . Atrial fibrillation ([place])   . CAD (coronary artery disease)   . Cancer [facility])    colon, bladder and kidney  . CHF (congestive heart failure) ([place])   . Colon adenocarcinoma ([place])   . COPD (chronic obstructive pulmonary disease) ([place])   . H/O ventricular tachycardia   . Hypertension   . Nonischemic cardiomyopathy ([place])    Status post AICD and pacemaker  . Renal cell carcinoma ([facility])    Social History   Socioeconomic History  . Marital status: Widowed    Spouse name: Not on file  . Number of children: Not on file  . Years of education: Not on file  . Highest education level: Not on file  Occupational History  . Not on file  Social Needs  . Financial resource strain: Not hard at all  . Food insecurity:    Worry: Not on file    Inability: Never true  . Transportation needs:    Medical: No    Non-medical: No  Tobacco Use  . Smoking status: Former Research scientist (life sciences)  . Smokeless tobacco: Never Used  Substance and Sexual Activity  . Alcohol use: No  . Drug use: No  . Sexual activity: Not on file  Lifestyle  . Physical activity:    Days per week: 3 days    Minutes per session: 10 min  . Stress: Not at all  Relationships  . Social connections:    Talks on phone: Patient refused    Gets together: Patient refused    Attends religious service: Patient refused    Active member of club or organization: Not on file    Attends meetings of clubs or organizations: Patient refused    Relationship status: Patient refused  Other Topics Concern  . Not on file  Social History Narrative   Lives at home by himself.  Independent at baseline   Family History  Problem Relation Age of Onset  . Hypertension Mother   . Stroke Father    Scheduled Meds: .  sodium chloride   Intravenous Once  . amiodarone  200 mg Oral Daily  . amoxicillin-clavulanate  500 mg Oral Q12H  . apixaban  5 mg Oral BID  . aspirin  81 mg Oral Daily  . atorvastatin  80 mg Oral Daily  . brimonidine  1 drop Both Eyes TID  . budesonide (PULMICORT) nebulizer solution  0.25 mg Nebulization BID  . Chlorhexidine  Gluconate Cloth  6 each Topical [id number]  . cholecalciferol  1,000 Units Oral Daily  . guaiFENesin  600 mg Oral BID  . ipratropium-albuterol  3 mL Nebulization TID  . latanoprost  1 drop Both Eyes QHS  . magnesium oxide  400 mg Oral BID  . metoprolol  200 mg Oral Daily  . mupirocin ointment  1 application Nasal BID  . neomycin-bacitracin-polymyxin   Topical BID  . sodium chloride flush  3 mL Intravenous Q12H  . tiotropium  18 mcg Inhalation Daily  . torsemide  20 mg Oral BID  . vitamin B-12  1,000 mcg Oral Daily   Continuous Infusions: . sodium chloride Stopped (03/02/19 1001)   PRN Meds:.sodium chloride, acetaminophen, diphenhydrAMINE, ipratropium-albuterol, nitroGLYCERIN, ondansetron (ZOFRAN) IV, sodium chloride flush No Known Allergies Review of Systems  Constitutional: Positive for activity change. Negative for appetite change.  Respiratory: Positive for shortness of breath.   Neurological: Positive for weakness.    Physical Exam Vitals signs and nursing note reviewed.  Constitutional:      General: He is not in acute distress. Cardiovascular:     Rate and Rhythm: Normal rate.     Comments: PACED  Pulmonary:     Effort: Pulmonary effort is normal. No tachypnea, accessory muscle usage or respiratory distress.     Breath sounds: Decreased air movement present.     Comments: Mild SOB Neurological:     Mental Status: He is alert and oriented to person, place, and time.     Vital Signs: BP (!) 152/52 (BP Location: Left Arm)   Pulse 60   Temp 98.3 F (36.8 C)   Resp 20   Ht '5\' 11"'$  (1.803 m)   Wt 98.2 kg   SpO2 95%   BMI 30.19 kg/m  Pain  Scale: 0-10 POSS *See Group Information*: 1-Acceptable,Awake and alert Pain Score: 0-No pain   SpO2: SpO2: 95 % O2 Device:SpO2: 95 % O2 Flow Rate: .O2 Flow Rate (L/min): 10 L/min  IO: Intake/output summary:   Intake/Output Summary (Last 24 hours) at 03/03/2019 1135 Last data filed at 03/03/2019 1105 Gross per 24 hour  Intake 1260 ml  Output 2575 ml  Net -1315 ml    LBM: Last BM Date: 03/01/19 Baseline Weight: Weight: 97.5 kg Most recent weight: Weight: 98.2 kg     Palliative Assessment/Data:     Time In: 1230 Time Out: 1320 Time Total: 50 MIN Greater than 50%  of this time was spent counseling and coordinating care related to the above assessment and plan.  Signed by: Vinie Sill, NP Palliative Medicine Team Pager # [phone] (M-F 8a-5p) Team Phone # [phone] (Nights/Weekends)

## 2019-03-03 NOTE — Progress Notes (Addendum)
Physical Therapy Treatment Patient Details Name: Darius Taylor MRN: [id number] DOB: 03/12/1946 Today's Date: 03/03/2019    History of Present Illness  73 yo male who comes to [facility] with many readmissions recently for SOB was readmitted on 02/23/19 for same.  Has acute CHF and respiratory failure, on 10 L O2 (3 baseline), elevated troponin from demand, and now referred to PT.  PMH: AF on eliquis, CAD, NICM Taylor EF 25%, AICD and PPM, CKD, colonic adenocarcinoma s/p partial colectomy, renal cell carcinoma s/p Left nephrectomy, COPD, O2 use, ventricular tachycardia, HTN,      PT Comments    Pt finishing up with RN upon entry, agreeable to working with PT, pt remembers Chief Strategy Officer from prior admissions. Pt performs 2 AMB bouts of 25ft with 8L, then 10L, terminal SpO2 82% and 87% respectively. Pt reports feeling a bit better today, but O2 needs remain high. Will continue to follow.       Follow Up Recommendations  Home health PT;Supervision for mobility/OOB     Equipment Recommendations  None recommended by PT    Recommendations for Other Services       Precautions / Restrictions Precautions Precautions: Fall;ICD/Pacemaker Precaution Comments: ck O2 sats Restrictions Weight Bearing Restrictions: No    Mobility  Bed Mobility Overal bed mobility: Modified Independent                Transfers Overall transfer level: Needs assistance Equipment used: Rolling walker (2 wheeled) Transfers: Sit to/from Stand Sit to Stand: Supervision         General transfer comment: STS for safety 2/2 deconditioned state  Ambulation/Gait   Gait Distance (Feet): 60 Feet(2x35ft with rest between. on 8L then 10L ) Assistive device: Rolling walker (2 wheeled) Gait Pattern/deviations: Step-through pattern;Decreased stride length;Wide base of support;Trunk flexed     General Gait Details: First AMB on 8L, drops to 82%, then on 10L drops to 87%. (reports to be tolerating better from a breathing  standpoint)   Stairs             Wheelchair Mobility    Modified Rankin (Stroke Patients Only)       Balance Overall balance assessment: Needs assistance Sitting-balance support: Feet supported Sitting balance-Leahy Scale: Good     Standing balance support: Single extremity supported;During functional activity Standing balance-Leahy Scale: Good                              Cognition Arousal/Alertness: Awake/alert Behavior During Therapy: WFL for tasks assessed/performed Overall Cognitive Status: Within Functional Limits for tasks assessed                                        Exercises      General Comments        Pertinent Vitals/Pain Pain Assessment: No/denies pain    Home Living                      Prior Function            PT Goals (current goals can now be found in the care plan section) Acute Rehab PT Goals Patient Stated Goal: to get home with help PT Goal Formulation: With patient Time For Goal Achievement: 03/16/19 Potential to Achieve Goals: Good Progress towards PT goals: Progressing toward goals    Frequency  Min 2X/week      PT Plan Current plan remains appropriate    Co-evaluation              AM-PAC PT "6 Clicks" Mobility   Outcome Measure  Help needed turning from your back to your side while in a flat bed without using bedrails?: None Help needed moving from lying on your back to sitting on the side of a flat bed without using bedrails?: A Little Help needed moving to and from a bed to a chair (including a wheelchair)?: A Little Help needed standing up from a chair using your arms (e.g., wheelchair or bedside chair)?: A Little Help needed to walk in hospital room?: A Little Help needed climbing 3-5 steps with a railing? : A Little 6 Click Score: 19    End of Session Equipment Utilized During Treatment: Gait belt;Oxygen Activity Tolerance: Patient tolerated treatment  well;Patient limited by fatigue Patient left: in chair;with call bell/phone within reach Nurse Communication: Mobility status PT Visit Diagnosis: Unsteadiness on feet (R26.81);Muscle weakness (generalized) (M62.81);Difficulty in walking, not elsewhere classified (R26.2)     Time: 8099-8338 PT Time Calculation (min) (ACUTE ONLY): 24 min  Charges:  $Therapeutic Exercise: 23-37 mins                     12:02 PM, 03/03/19 Darius Taylor, PT, DPT Physical Therapist - [facility]  [phone] ([place])     Darius Taylor 03/03/2019, 12:00 PM

## 2019-03-04 DIAGNOSIS — J441 Chronic obstructive pulmonary disease with (acute) exacerbation: Secondary | ICD-10-CM

## 2019-03-04 LAB — CBC WITH DIFFERENTIAL/PLATELET
Abs Immature Granulocytes: 0.17 10*3/uL — ABNORMAL HIGH (ref 0.00–0.07)
Basophils Absolute: 0 10*3/uL (ref 0.0–0.1)
Basophils Relative: 0 %
Eosinophils Absolute: 0.2 10*3/uL (ref 0.0–0.5)
Eosinophils Relative: 2 %
HCT: 25.8 % — ABNORMAL LOW (ref 39.0–52.0)
Hemoglobin: 7.6 g/dL — ABNORMAL LOW (ref 13.0–17.0)
Immature Granulocytes: 1 %
Lymphocytes Relative: 8 %
Lymphs Abs: 1 10*3/uL (ref 0.7–4.0)
MCH: 28.3 pg (ref 26.0–34.0)
MCHC: 29.5 g/dL — ABNORMAL LOW (ref 30.0–36.0)
MCV: 95.9 fL (ref 80.0–100.0)
Monocytes Absolute: 1.5 10*3/uL — ABNORMAL HIGH (ref 0.1–1.0)
Monocytes Relative: 13 %
Neutro Abs: 8.9 10*3/uL — ABNORMAL HIGH (ref 1.7–7.7)
Neutrophils Relative %: 76 %
Platelets: 226 10*3/uL (ref 150–400)
RBC: 2.69 MIL/uL — ABNORMAL LOW (ref 4.22–5.81)
RDW: 15.7 % — ABNORMAL HIGH (ref 11.5–15.5)
WBC: 11.8 10*3/uL — ABNORMAL HIGH (ref 4.0–10.5)
nRBC: 0.3 % — ABNORMAL HIGH (ref 0.0–0.2)

## 2019-03-04 LAB — BASIC METABOLIC PANEL
Anion gap: 8 (ref 5–15)
BUN: 85 mg/dL — ABNORMAL HIGH (ref 8–23)
CO2: 31 mmol/L (ref 22–32)
Calcium: 8.2 mg/dL — ABNORMAL LOW (ref 8.9–10.3)
Chloride: 103 mmol/L (ref 98–111)
Creatinine, Ser: 1.71 mg/dL — ABNORMAL HIGH (ref 0.61–1.24)
GFR calc Af Amer: 45 mL/min — ABNORMAL LOW (ref >60)
GFR calc non Af Amer: 39 mL/min — ABNORMAL LOW (ref >60)
Glucose, Bld: 190 mg/dL — ABNORMAL HIGH (ref 70–99)
Potassium: 4 mmol/L (ref 3.5–5.1)
Sodium: 142 mmol/L (ref 135–145)

## 2019-03-04 LAB — PREPARE RBC (CROSSMATCH)

## 2019-03-04 MED ORDER — SODIUM CHLORIDE 0.9% IV SOLUTION
Freq: Once | INTRAVENOUS | Status: AC
  Administered 2019-03-04: 10:00:00 via INTRAVENOUS

## 2019-03-04 NOTE — Progress Notes (Signed)
[facility], [place] 03/04/19  Subjective:  Creatinine continues to trend downwards. Currently creatinine 1.7. Patient resting comfortably.   Objective:  Vital signs in last 24 hours:  Temp:  [97.7 F (36.5 C)-98.7 F (37.1 C)] 97.8 F (36.6 C) (06/10 1043) Pulse Rate:  [59-68] 68 (06/10 1043) Resp:  [16-18] 16 (06/10 1043) BP: (109-149)/(41-75) 117/75 (06/10 1043) SpO2:  [88 %-97 %] 93 % (06/10 1043) Weight:  [99.5 kg] 99.5 kg (06/10 0357)  Weight change: 1.319 kg Filed Weights   03/02/19 0454 03/03/19 0437 03/04/19 0357  Weight: 100.6 kg 98.2 kg 99.5 kg    Intake/Output:    Intake/Output Summary (Last 24 hours) at 03/04/2019 1323 Last data filed at 03/04/2019 1233 Gross per 24 hour  Intake 360 ml  Output 2350 ml  Net -1990 ml   Physical Exam: General:  Elderly gentleman, no acute distress  HEENT  anicteric, moist oral mucous membranes  Neck:  Supple  Lungs:  Minimal basilar rales, normal effort  Heart::  S1S2 no rubs  Abdomen:  Obese, soft, nontender  Extremities:  trace edema bilaterally  Neurologic:  Alert, oriented  Skin:  No acute rashes       Basic Metabolic Panel:  Recent Labs  Lab 02/28/19 0558 03/01/19 0420 03/02/19 0447 03/03/19 0628 03/04/19 0338  NA 136 137 142 141 142  K 5.1 5.0 4.7 4.3 4.0  CL 99 100 103 104 103  CO2 24 27 28 29 31   GLUCOSE 211* 205* 226* 154* 190*  BUN 109* 113* 116* 96* 85*  CREATININE 2.50* 2.38* 2.09* 1.76* 1.71*  CALCIUM 8.2* 8.3* 8.3* 8.3* 8.2*     CBC: Recent Labs  Lab 02/28/19 0558 03/01/19 0420 03/02/19 0447 03/02/19 2012 03/03/19 0628 03/04/19 0338  WBC 18.0* 12.4* 11.7*  --  13.2* 11.8*  NEUTROABS  --  11.6* 10.5*  --  9.9* 8.9*  HGB 7.8* 7.8* 7.4* 8.6* 8.3* 7.6*  HCT 25.9* 25.9* 24.9* 28.5* 27.0* 25.8*  MCV 95.6 97.4 96.5  --  93.4 95.9  PLT 360 329 282  --  252 226     No results found for: HEPBSAG, HEPBSAB, HEPBIGM    Microbiology:  Recent Results  (from the past 240 hour(s))  SARS Coronavirus 2 (CEPHEID- Performed in [place] hospital lab), Hosp Order     Status: None   Collection Time: 02/23/19  1:05 PM  Result Value Ref Range Status   SARS Coronavirus 2 NEGATIVE NEGATIVE Final    Comment: (NOTE) If result is NEGATIVE SARS-CoV-2 target nucleic acids are NOT DETECTED. The SARS-CoV-2 RNA is generally detectable in upper and lower  respiratory specimens during the acute phase of infection. The lowest  concentration of SARS-CoV-2 viral copies this assay can detect is 250  copies / mL. A negative result does not preclude SARS-CoV-2 infection  and should not be used as the sole basis for treatment or other  patient management decisions.  A negative result may occur with  improper specimen collection / handling, submission of specimen other  than nasopharyngeal swab, presence of viral mutation(s) within the  areas targeted by this assay, and inadequate number of viral copies  (<250 copies / mL). A negative result must be combined with clinical  observations, patient history, and epidemiological information. If result is POSITIVE SARS-CoV-2 target nucleic acids are DETECTED. The SARS-CoV-2 RNA is generally detectable in upper and lower  respiratory specimens dur ing the acute phase of infection.  Positive  results are indicative  of active infection with SARS-CoV-2.  Clinical  correlation with patient history and other diagnostic information is  necessary to determine patient infection status.  Positive results do  not rule out bacterial infection or co-infection with other viruses. If result is PRESUMPTIVE POSTIVE SARS-CoV-2 nucleic acids MAY BE PRESENT.   A presumptive positive result was obtained on the submitted specimen  and confirmed on repeat testing.  While 2019 novel coronavirus  (SARS-CoV-2) nucleic acids may be present in the submitted sample  additional confirmatory testing may be necessary for epidemiological  and /  or clinical management purposes  to differentiate between  SARS-CoV-2 and other Sarbecovirus currently known to infect humans.  If clinically indicated additional testing with an alternate test  methodology [phone]) is advised. The SARS-CoV-2 RNA is generally  detectable in upper and lower respiratory sp ecimens during the acute  phase of infection. The expected result is Negative. Fact Sheet for Patients:  StrictlyIdeas.no Fact Sheet for Healthcare Providers: BankingDealers.co.za This test is not yet approved or cleared by the [place] FDA and has been authorized for detection and/or diagnosis of SARS-CoV-2 by FDA under an Emergency Use Authorization (EUA).  This EUA will remain in effect (meaning this test can be used) for the duration of the COVID-19 declaration under Section 564(b)(1) of the Act, 21 U.S.C. section 360bbb-3(b)(1), unless the authorization is terminated or revoked sooner. Performed at [facility], [address] [place], [place] [address]   Blood culture (routine x 2)     Status: None   Collection Time: 02/23/19  1:05 PM  Result Value Ref Range Status   Specimen Description BLOOD LEFT ANTECUBITAL  Final   Special Requests   Final    BOTTLES DRAWN AEROBIC ONLY Blood Culture results may not be optimal due to an inadequate volume of blood received in culture bottles   Culture   Final    NO GROWTH 5 DAYS Performed at [facility], [address], [place], [place] [address]    Report Status 02/28/2019 FINAL  Final  Blood culture (routine x 2)     Status: None   Collection Time: 02/23/19  1:05 PM  Result Value Ref Range Status   Specimen Description BLOOD RIGHT ANTECUBITAL  Final   Special Requests   Final    BOTTLES DRAWN AEROBIC AND ANAEROBIC Blood Culture adequate volume   Culture   Final    NO GROWTH 5 DAYS Performed at [facility], [address], [place], [place] [address]     Report Status 02/28/2019 FINAL  Final  Aerobic Culture (superficial specimen)     Status: None   Collection Time: 02/27/19 10:41 AM  Result Value Ref Range Status   Specimen Description   Final    WOUND LEFT THIGH Performed at [facility], [place] [address], [place], [place] [address]    Special Requests   Final    NONE Performed at [facility], [place], [place] [address]    Gram Stain NO WBC SEEN NO ORGANISMS SEEN   Final   Culture   Final    NO GROWTH 2 DAYS Performed at [facility], [place] [address], [place], [place] [address]    Report Status 03/01/2019 FINAL  Final    Coagulation Studies: No results for input(s): LABPROT, INR in the last 72 hours.  Urinalysis: No results for input(s): COLORURINE, LABSPEC, PHURINE, GLUCOSEU, HGBUR, BILIRUBINUR, KETONESUR, PROTEINUR, UROBILINOGEN, NITRITE, LEUKOCYTESUR in the last 72  hours.  Invalid input(s): APPERANCEUR    Imaging: No results found.   Medications:   . sodium chloride Stopped (03/02/19 1001)   . sodium chloride   Intravenous Once  . amiodarone  200 mg Oral Daily  . amoxicillin-clavulanate  500 mg Oral Q12H  . apixaban  5 mg Oral BID  . aspirin  81 mg Oral Daily  . atorvastatin  80 mg Oral Daily  . brimonidine  1 drop Both Eyes TID  . cholecalciferol  1,000 Units Oral Daily  . guaiFENesin  600 mg Oral BID  . ipratropium-albuterol  3 mL Nebulization TID  . latanoprost  1 drop Both Eyes QHS  . magnesium oxide  400 mg Oral BID  . metoprolol  200 mg Oral Daily  . neomycin-bacitracin-polymyxin   Topical BID  . sodium chloride flush  3 mL Intravenous Q12H  . tiotropium  18 mcg Inhalation Daily  . torsemide  20 mg Oral BID  . vitamin B-12  1,000 mcg Oral Daily   sodium chloride, acetaminophen, diphenhydrAMINE, ipratropium-albuterol, nitroGLYCERIN, ondansetron (ZOFRAN) IV, sodium chloride flush  Assessment/ Plan:  73 y.o. African-American male with medical problems of  coronary disease, history of PCI 2013, nonischemic dilated cardiomyopathy, AICD 2006, aortic stenosis, Taylor 2018, atrial fibrillation on Eliquis, CKD stage III, COPD with 3 L supplemental oxygen, was admitted on 02/23/2019 with worsening shortness of breath and lower extremity edema.   1.  Acute kidney injury on chronic kidney disease stage III Baseline creatinine 1.65/GFR 47 at the time of admission on June 3rd.  Renal function continues to improve daily.  Creatinine down to 1.7 with a BUN of 85.  Continue to monitor renal parameters daily for now.  2.  Shortness of breath and volume overload with lower extremity edema Patient doing well with the transition of lasix to torsemide.   LOS: 9 Tymon Nemetz 6/10/20201:23 PM  [place], [place]  Note: This note was prepared with Dragon dictation. Any transcription errors are unintentional

## 2019-03-04 NOTE — Progress Notes (Addendum)
Nutrition Brief Note  Patient identified for LOS  73 y.o.malewith past medical history of diastolic CHF (EF 20-81% 3/88/71), atrial fibrillation on Eliquis, s/p AICD, aortic stenosis s/p TAVR 2018, HTN, COPD, CKD stage 3, h/o renal cell carcinoma (s/p left nephrectomy 2000), h/o colon adenocarcinoma (colectomy 2016),admitted on6/1/2020with SOB with acute hypoxic respiratory failure with CHF and COPD exacerbation along with declining renal function and anemia requiring transfusion.  Pt continues to do welling; pt eating 100% of meals. Per chart, pt is weight stable. CHF diet education provided on 6/3. Pt is on 2g sodium diet.   Wt Readings from Last 15 Encounters:  03/04/19 99.5 kg  02/05/19 96.2 kg  02/04/19 96.3 kg  01/30/19 96.2 kg  01/21/19 102.3 kg  11/12/18 93.4 kg  09/22/18 97.6 kg  09/02/18 94.5 kg  08/22/18 98.4 kg  06/10/18 95.3 kg  01/11/18 94.1 kg  01/02/18 95.3 kg  11/18/17 95.5 kg  10/30/17 96.6 kg  06/10/17 97.5 kg    Body mass index is 30.6 kg/m. Patient meets criteria for obesity based on current BMI.   No nutrition interventions warranted at this time. If nutrition issues arise, please consult RD.   Koleen Distance MS, RD, LDN Pager #- [phone] Office#- [phone] After Hours Pager: [phone]

## 2019-03-04 NOTE — Progress Notes (Signed)
Palliative:  HPI: 73 y.o. male  with past medical history of diastolic CHF (EF 03-40% 3/52/48), atrial fibrillation on Eliquis, s/p AICD, aortic stenosis s/p TAVR 2018, HTN, COPD, CKD stage 3, h/o renal cell carcinoma (s/p left nephrectomy 2000), h/o colon adenocarcinoma (colectomy 2016), admitted on 02/23/2019 with SOB with acute hypoxic respiratory failure with CHF and COPD exacerbation along with declining renal function and anemia requiring transfusion. Over course of hospitalization he has had minimal to no improvement and is currently requiring 10L oxygen (3L at home). He is also noted to have 5 admissions over the past 6 months d/t mostly respiratory failure (COPD/CHF) and renal failure. Finally beginning to wean down HFNC today and make some progress. He is feeling very good and without complaint.   I met again today with Mr. Bentson. He is sitting on side of bed and has eaten 100% of his breakfast. He continues to feel good and basically to his baseline. He understands we are trying to titrate oxygen down and plans for another blood transfusion today. He is sleeping well and denies any SOB or discomfort. He has no concerns. Goals are clear for continued full aggressive care with full code.   I discussed with Mr. Don if he were interested in completing HCPOA/Living Will and he says that he is not interested. He has 2 living adult daughters and would trust them to make decisions together for him if he could not make them himself. No further questions/concerns. Emotional support provided.   Vinie Sill, NP Palliative Medicine Team Pager # [phone] (M-F 8a-5p) Team Phone # [phone] (Nights/Weekends)  Total Time: 15 min  Greater than 50%  of this time was spent counseling and coordinating care related to the above assessment and plan

## 2019-03-04 NOTE — Plan of Care (Signed)
Patient s/p blood transfusion today, OOB to chair for most of shift, O2 weaned to 5L.   Diuresing well. Patient continues to ask for juice/drinks despite education on fluid restrictions. Reiterated importance of restrictions.   Problem: Clinical Measurements: Goal: Respiratory complications will improve Outcome: Progressing   Problem: Activity: Goal: Risk for activity intolerance will decrease Outcome: Progressing   Problem: Nutrition: Goal: Adequate nutrition will be maintained Outcome: Progressing   Problem: Education: Goal: Ability to demonstrate management of disease process will improve Outcome: Not Progressing   Problem: Cardiac: Goal: Ability to achieve and maintain adequate cardiopulmonary perfusion will improve Outcome: Progressing

## 2019-03-04 NOTE — Progress Notes (Signed)
[facility] NAME: Darius Taylor    MR#:  [id number]  DATE OF BIRTH:  [birth year]  SUBJECTIVE:   Chief Complaint  Patient presents with   Shortness of Breath   Patient seen at the bedside. Overall he feels he is doing fine despite despite requiring oxygen at 10L. Remains SOB worse with exertion. No significant events noted overnight.  REVIEW OF SYSTEMS:  Review of Systems  Constitutional: Negative for chills, fever, malaise/fatigue and weight loss.  HENT: Negative for congestion, hearing loss and sore throat.   Eyes: Negative for blurred vision and double vision.  Respiratory: Positive for shortness of breath. Negative for cough and wheezing.   Cardiovascular: Positive for leg swelling. Negative for chest pain, palpitations and orthopnea.  Gastrointestinal: Negative for abdominal pain, diarrhea, nausea and vomiting.  Genitourinary: Negative for dysuria and urgency.  Musculoskeletal: Negative for myalgias.  Skin: Negative for rash.  Neurological: Negative for dizziness, sensory change, speech change, focal weakness and headaches.  Psychiatric/Behavioral: Negative for depression.   DRUG ALLERGIES:  No Known Allergies VITALS:  Blood pressure 117/75, pulse 68, temperature 97.8 F (36.6 C), temperature source Oral, resp. rate 16, height 5\' 11"  (1.803 m), weight 99.5 kg, SpO2 93 %. PHYSICAL EXAMINATION:   GENERAL:  73 y.o.-year-old patient lying in the bed with no acute distress. Pale appearing EYES: Pupils equal, round, reactive to light and accommodation. No scleral icterus. Extraocular muscles intact.  HEENT: Head atraumatic, normocephalic. Oropharynx and nasopharynx clear.  NECK:  Supple, no jugular venous distention. No thyroid enlargement, no tenderness.  LUNGS: Decreased breath sounds bilaterally, Lungs coarse to auscultation. Mild wheezing posteriorly. No rales,rhonchi or crepitation. No use of accessory muscles of  respiration.  CARDIOVASCULAR: S1, S2 normal. No murmurs, rubs, or gallops.  ABDOMEN: Soft, nontender, nondistended. Bowel sounds present. No organomegaly or mass.  EXTREMITIES: Pitting edema of bilateral lower extremities. No cyanosis, or clubbing. No rash or lesions. + pedal pulses MUSCULOSKELETAL: Normal bulk, and power was 5+ grip and elbow, knee, and ankle flexion and extension bilaterally.  NEUROLOGIC:Alert and oriented x 3. CN 2-12 intact. Sensation to light touch and cold stimuli intact bilaterally. Finger to nose nl. Babinski is downgoing. DTR's (biceps, patellar, and achilles) 2+ and symmetric throughout. Gait not tested due to safety concern. PSYCHIATRIC: The patient is alert and oriented x 3.  SKIN: Left thigh cellulitis improving as below compared to previous      DATA REVIEWED:  LABORATORY PANEL:  Male CBC Recent Labs  Lab 03/04/19 0338  WBC 11.8*  HGB 7.6*  HCT 25.8*  PLT 226   ------------------------------------------------------------------------------------------------------------------ Chemistries  Recent Labs  Lab 03/04/19 0338  NA 142  K 4.0  CL 103  CO2 31  GLUCOSE 190*  BUN 85*  CREATININE 1.71*  CALCIUM 8.2*   RADIOLOGY:  No results found. ASSESSMENT AND PLAN:   73 y.o. male with history of chronic atrial fibrillation on anticoagulation with Eliquis,chronic kidney disease stage III,COPD,prior history of AICD,chronic diastolic CHF and prior history of asthma admitted with acute hypoxic respiratory failure secondary to CHF exacerbation.  1.Acute hypoxic respiratory failure secondary to CHF exacerbation. - Still requires high flow oxygen. Weaning oxygen as tolerated - Continue nebs   2.Acute on chronic diastolic CHF exacerbation.  - Last Echo 10/2018 with  EF 50-55% - Continue Lorstan - Beta-Blockade: Metoprolol - Diuretics:Torsemide po. Diureses >1L negative per day until approach euvolemia / worsening renal function. - Low salt  diet  -  Strict I&Os - Fluid restriction - Cardiology input appreciated  3. Advanced COPD - Duo nebs every 6 - Supplemental oxygen as needed - Palliative following  4. Chronic Anemia-Irons studies suggested iron deficiency with no acute bleeding.  - received 1 dose of IV Venofer, and I unit of PRBC on 01/2019 and 6/9 -  hemoglobin down to 7.6 from 8.3 today - Will transfuse with 1 unit of PRBCs - Continue to monitor H&H  5.  Left thigh Cellulitis - no worsening symptoms - Augmentin - Leukocytosis likely due to steroid now improving  6.Chronic atrial fibrillation -rate controlled on Amiodarone + Metoprolol - Continue Eliquis  7.Acute chronic kidney disease stage III - slight improvement in creatine today - Avoid nephrotoxins - Continue to monitor renal function - Nephrology following  8. DVT prophylaxis - Therapeutically anti-coagulated with Eliquis   All the records are reviewed and case discussed with Care Management/Social Worker. Management plans discussed with the patient, family and they are in agreement.  CODE STATUS: Full Code  TOTAL TIME TAKING CARE OF THIS PATIENT: 38 minutes.   More than 50% of the time was spent in counseling/coordination of care: YES  POSSIBLE D/C IN 1 DAYS, DEPENDING ON CLINICAL CONDITION.    03/04/2019 at 12:05 PM  This patient was staffed with Dr. Valetta Fuller, [facility] who personally evaluated patient, reviewed documentation and agreed with assessment and plan of care as above.  Rufina Falco, DNP, FNP-BC [facility] Nurse Practitioner   Between 7am to 6pm - Pager [phone]  After 6pm go to [email or web address] - Technical brewer [facility]  Office  [phone]  CC: Primary care physician; [employer]  Note: This dictation was prepared with Diplomatic Services operational officer dictation along with smaller Company secretary. Any transcriptional errors that result from this process are unintentional.

## 2019-03-04 NOTE — Progress Notes (Signed)
Pulmonary Medicine          Date: 03/04/2019,   MRN# [id number] Darius Taylor 10/09/1945     AdmissionWeight: 97.5 kg                 CurrentWeight: 99.5 kg       Subjective    Patient is eating dinner, he has been weaned to 4L/min [place] which is significantly improved from yesterday. In the past patient was very slow to improve due to continued fluid intake.    PAST MEDICAL HISTORY   Past Medical History:  Diagnosis Date  . Aortic stenosis   . Arrhythmia    atrial fibrillation  . Asthma   . Atrial fibrillation ([place])   . CAD (coronary artery disease)   . Cancer [facility])    colon, bladder and kidney  . CHF (congestive heart failure) ([place])   . Colon adenocarcinoma ([place])   . COPD (chronic obstructive pulmonary disease) ([place])   . H/O ventricular tachycardia   . Hypertension   . Nonischemic cardiomyopathy ([place])    Status post AICD and pacemaker  . Renal cell carcinoma ([place])      SURGICAL HISTORY   Past Surgical History:  Procedure Laterality Date  . CARDIAC DEFIBRILLATOR PLACEMENT    . CARDIAC DEFIBRILLATOR PLACEMENT    . COLONOSCOPY WITH PROPOFOL N/A 06/10/2018   Procedure: COLONOSCOPY WITH PROPOFOL;  Surgeon: Lin Landsman, MD;  Location: [facility] ENDOSCOPY;  Service: Gastroenterology;  Laterality: N/A;  . CORONARY ANGIOPLASTY  09/25/2016  . IRRIGATION AND DEBRIDEMENT HEMATOMA Left 10/24/2016   Procedure: IRRIGATION AND DEBRIDEMENT HEMATOMA;  Surgeon: Florene Glen, MD;  Location: [facility] ORS;  Service: General;  Laterality: Left;  . KIDNEY SURGERY     left ne[hrectomy for RCC  . PACEMAKER IMPLANT    . PARTIAL COLECTOMY       FAMILY HISTORY   Family History  Problem Relation Age of Onset  . Hypertension Mother   . Stroke Father      SOCIAL HISTORY   Social History   Tobacco Use  . Smoking status: Former Research scientist (life sciences)  . Smokeless tobacco: Never Used  Substance Use Topics  . Alcohol use: No  . Drug use: No     MEDICATIONS    Home  Medication:    Current Medication:  Current Facility-Administered Medications:  .  0.9 %  sodium chloride infusion (Manually program via Guardrails IV Fluids), , Intravenous, Once, [place], Bing Neighbors, NP .  0.9 %  sodium chloride infusion, 250 mL, Intravenous, PRN, Stark Jock, Jude, MD, Stopped at 03/02/19 1001 .  acetaminophen (TYLENOL) tablet 650 mg, 650 mg, Oral, Q4H PRN, Stark Jock, Jude, MD, 650 mg at 03/04/19 0257 .  amiodarone (PACERONE) tablet 200 mg, 200 mg, Oral, Daily, Ojie, Jude, MD, 200 mg at 03/04/19 0828 .  amoxicillin-clavulanate (AUGMENTIN) 500-125 MG per tablet 500 mg, 500 mg, Oral, Q12H, Mayo, Pete Pelt, MD, 500 mg at 03/04/19 0830 .  apixaban (ELIQUIS) tablet 5 mg, 5 mg, Oral, BID, Ojie, Jude, MD, 5 mg at 03/04/19 0828 .  aspirin chewable tablet 81 mg, 81 mg, Oral, Daily, Ojie, Jude, MD, 81 mg at 03/04/19 0827 .  atorvastatin (LIPITOR) tablet 80 mg, 80 mg, Oral, Daily, Ojie, Jude, MD, 80 mg at 03/04/19 0827 .  brimonidine (ALPHAGAN) 0.15 % ophthalmic solution 1 drop, 1 drop, Both Eyes, TID, Ojie, Jude, MD, 1 drop at 03/04/19 1621 .  cholecalciferol (VITAMIN D) tablet 1,000 Units, 1,000 Units, Oral, Daily, Ojie,  Jude, MD, 1,000 Units at 03/04/19 0828 .  diphenhydrAMINE (BENADRYL) capsule 25 mg, 25 mg, Oral, QHS PRN, Lance Coon, MD, 25 mg at 02/28/19 2207 .  guaiFENesin (MUCINEX) 12 hr tablet 600 mg, 600 mg, Oral, BID, Dustin Flock, MD, 600 mg at 03/04/19 0828 .  ipratropium-albuterol (DUONEB) 0.5-2.5 (3) MG/3ML nebulizer solution 3 mL, 3 mL, Nebulization, TID, Dustin Flock, MD, 3 mL at 03/04/19 1308 .  ipratropium-albuterol (DUONEB) 0.5-2.5 (3) MG/3ML nebulizer solution 3 mL, 3 mL, Nebulization, Q4H PRN, Dustin Flock, MD, 3 mL at 03/01/19 1640 .  latanoprost (XALATAN) 0.005 % ophthalmic solution 1 drop, 1 drop, Both Eyes, QHS, Ojie, Jude, MD, 1 drop at 03/03/19 2245 .  magnesium oxide (MAG-OX) tablet 400 mg, 400 mg, Oral, BID, Ojie, Jude, MD, 400 mg at 03/04/19 0827 .   metoprolol succinate (TOPROL-XL) 24 hr tablet 200 mg, 200 mg, Oral, Daily, Ojie, Jude, MD, 200 mg at 03/04/19 0827 .  neomycin-bacitracin-polymyxin (NEOSPORIN) ointment, , Topical, BID, Dustin Flock, MD .  nitroGLYCERIN (NITROSTAT) SL tablet 0.4 mg, 0.4 mg, Sublingual, Q5 min PRN, Ojie, Jude, MD .  ondansetron (ZOFRAN) injection 4 mg, 4 mg, Intravenous, Q6H PRN, Ojie, Jude, MD .  sodium chloride flush (NS) 0.9 % injection 3 mL, 3 mL, Intravenous, Q12H, Ojie, Jude, MD, 3 mL at 03/04/19 0829 .  sodium chloride flush (NS) 0.9 % injection 3 mL, 3 mL, Intravenous, PRN, Ojie, Jude, MD .  tiotropium (SPIRIVA) inhalation capsule ([facility] use ONLY) 18 mcg, 18 mcg, Inhalation, Daily, Ojie, Jude, MD, 18 mcg at 03/04/19 0829 .  torsemide (DEMADEX) tablet 20 mg, 20 mg, Oral, BID, Lateef, Munsoor, MD, 20 mg at 03/04/19 1621 .  vitamin B-12 (CYANOCOBALAMIN) tablet 1,000 mcg, 1,000 mcg, Oral, Daily, Dustin Flock, MD, 1,000 mcg at 03/04/19 0827    ALLERGIES   Patient has no known allergies.     REVIEW OF SYSTEMS    Review of Systems:  Gen:  Denies  fever, sweats, chills weigh loss  HEENT: Denies blurred vision, double vision, ear pain, eye pain, hearing loss, nose bleeds, sore throat Cardiac:  No dizziness, chest pain or heaviness, chest tightness,edema Resp:   Denies cough or sputum porduction, shortness of breath,wheezing, hemoptysis,  Gi: Denies swallowing difficulty, stomach pain, nausea or vomiting, diarrhea, constipation, bowel incontinence Gu:  Denies bladder incontinence, burning urine Ext:   Denies Joint pain, stiffness or swelling Skin: Denies  skin rash, easy bruising or bleeding or hives Endoc:  Denies polyuria, polydipsia , polyphagia or weight change Psych:   Denies depression, insomnia or hallucinations   Other:  All other systems negative   VS: BP 130/82 (BP Location: Right Arm)   Pulse 61   Temp 98.5 F (36.9 C)   Resp 18   Ht 5\' 11"  (1.803 m)   Wt 99.5 kg   SpO2 91%    BMI 30.60 kg/m      PHYSICAL EXAM    GENERAL:NAD, no fevers, chills, no weakness no fatigue HEAD: Normocephalic, atraumatic.  EYES: Pupils equal, round, reactive to light. Extraocular muscles intact. No scleral icterus.  MOUTH: Moist mucosal membrane. Dentition intact. No abscess noted.  EAR, NOSE, THROAT: Clear without exudates. No external lesions.  NECK: Supple. No thyromegaly. No nodules. No JVD.  PULMONARY: bibasilar crackles and mild rhonchi worse on right CARDIOVASCULAR: S1 and S2. Regular rate and rhythm. No murmurs, rubs, or gallops. No edema. Pedal pulses 2+ bilaterally.  GASTROINTESTINAL: Soft, nontender, nondistended. No masses. Positive bowel sounds. No hepatosplenomegaly.  MUSCULOSKELETAL:  2+ LE edema on right and 3+ at LLE NEUROLOGIC: Cranial nerves II through XII are intact. No gross focal neurological deficits. Sensation intact. Reflexes intact.  SKIN: No ulceration, lesions, rashes, or cyanosis. Skin warm and dry. Turgor intact.  PSYCHIATRIC: Mood, affect within normal limits. The patient is awake, alert and oriented x 3. Insight, judgment intact.       IMAGING    Dg Chest Port 1 View  Result Date: 03/01/2019 CLINICAL DATA:  Intermittent hypoxia. EXAM: PORTABLE CHEST 1 VIEW COMPARISON:  02/23/2019 FINDINGS: Left-sided pacemaker unchanged. Lungs are adequately inflated and demonstrate slight improvement and mild hazy perihilar attenuation of the vessels suggesting improving interstitial edema. Persistent small left effusion. Stable moderate cardiomegaly. Remainder the exam is unchanged. IMPRESSION: Findings suggesting slight interval improvement with residual mild vascular congestion and small left effusion. Stable cardiomegaly. Electronically Signed   By: Marin Olp M.D.   On: 03/01/2019 17:26   Dg Chest Port 1 View  Result Date: 02/23/2019 CLINICAL DATA:  Shortness of breath EXAM: PORTABLE CHEST 1 VIEW COMPARISON:  Jan 28, 2019 and January 09, 2019 FINDINGS: There  is interstitial edema throughout the lungs, essentially stable. There are small pleural effusions bilaterally. There is no frank airspace consolidation. There is cardiomegaly with pulmonary venous hypertension. Pacemaker leads are attached to the right atrium and right ventricle. There is aortic atherosclerosis. No adenopathy. No bone lesions. IMPRESSION: Pulmonary vascular congestion with small pleural effusions and interstitial edema. Appearance is felt to be indicative of a degree of congestive heart failure. There may well be superimposed noncardiogenic interstitial pneumonitis, although there is no frank airspace consolidation. Pacemaker leads attached to right atrium and right ventricle. Aortic Atherosclerosis (ICD10-I70.0). Electronically Signed   By: Lowella Grip III M.D.   On: 02/23/2019 13:25      ASSESSMENT/PLAN   Acute on chronic hypoxemic respiratory failure due to Acute on chronic systolic and diastolic decompensated CHF with EF 50 to 43% complicated by advanced COPD with chronic hypoxemia, severe anemia and possible pulmonary hypertension suggested by severely elevated RVSP most recent echo -Agree with diuresis- nephro on case s/p PRBC transfusion -  -Cardiology on case appreciate input -Strict I's and O's -Fluid intakerestirction 1200cc-patient continues to drink high volume fluids  Complicated by CKD - renal team on case - appreciate input-torsemide 20 bid    Advanced COPD -continue COPD carepath, will d/c pulmicort.  Continue duoneb q6 while awake -IS at bedsie, ordering MetaNeb to help with recruitment -Acapella      Thank you for allowing me to participate in the care of this patient.   Patient/Family are satisfied with care plan and all questions have been answered.  This document was prepared using Dragon voice recognition software and may include unintentional dictation errors.     Ottie Glazier, M.D.  Division of [facility]

## 2019-03-05 ENCOUNTER — Encounter: Payer: Self-pay | Admitting: *Deleted

## 2019-03-05 ENCOUNTER — Inpatient Hospital Stay: Payer: Medicare HMO

## 2019-03-05 LAB — BASIC METABOLIC PANEL
Anion gap: 7 (ref 5–15)
BUN: 59 mg/dL — ABNORMAL HIGH (ref 8–23)
CO2: 34 mmol/L — ABNORMAL HIGH (ref 22–32)
Calcium: 8.3 mg/dL — ABNORMAL LOW (ref 8.9–10.3)
Chloride: 102 mmol/L (ref 98–111)
Creatinine, Ser: 1.45 mg/dL — ABNORMAL HIGH (ref 0.61–1.24)
GFR calc Af Amer: 55 mL/min — ABNORMAL LOW (ref >60)
GFR calc non Af Amer: 47 mL/min — ABNORMAL LOW (ref >60)
Glucose, Bld: 118 mg/dL — ABNORMAL HIGH (ref 70–99)
Potassium: 3.7 mmol/L (ref 3.5–5.1)
Sodium: 143 mmol/L (ref 135–145)

## 2019-03-05 LAB — BPAM RBC
Blood Product Expiration Date: 202006112359
Blood Product Expiration Date: 202007042359
ISSUE DATE / TIME: 202006081417
ISSUE DATE / TIME: 202006101021
Unit Type and Rh: 5100
Unit Type and Rh: 9500

## 2019-03-05 LAB — CBC
HCT: 28.6 % — ABNORMAL LOW (ref 39.0–52.0)
Hemoglobin: 8.7 g/dL — ABNORMAL LOW (ref 13.0–17.0)
MCH: 28.2 pg (ref 26.0–34.0)
MCHC: 30.4 g/dL (ref 30.0–36.0)
MCV: 92.9 fL (ref 80.0–100.0)
Platelets: 210 10*3/uL (ref 150–400)
RBC: 3.08 MIL/uL — ABNORMAL LOW (ref 4.22–5.81)
RDW: 16.2 % — ABNORMAL HIGH (ref 11.5–15.5)
WBC: 13.3 10*3/uL — ABNORMAL HIGH (ref 4.0–10.5)
nRBC: 0.2 % (ref 0.0–0.2)

## 2019-03-05 LAB — TYPE AND SCREEN
ABO/RH(D): O POS
Antibody Screen: NEGATIVE
Unit division: 0
Unit division: 0

## 2019-03-05 MED ORDER — CYANOCOBALAMIN 1000 MCG PO TABS: 1000.0000 ug | ORAL_TABLET | Freq: Every day | ORAL | 0 refills | Status: AC

## 2019-03-05 MED ORDER — IPRATROPIUM-ALBUTEROL 0.5-2.5 (3) MG/3ML IN SOLN: 3.0000 mL | Freq: Three times a day (TID) | RESPIRATORY_TRACT | 0 refills | Status: DC

## 2019-03-05 MED ORDER — TORSEMIDE 20 MG PO TABS: 20.0000 mg | ORAL_TABLET | Freq: Two times a day (BID) | ORAL | 0 refills | Status: DC

## 2019-03-05 NOTE — Progress Notes (Signed)
[facility], [place] 03/05/19  Subjective:  Patient continues to do well. Creatinine down to 1.5. Breathing comfortably.   Objective:  Vital signs in last 24 hours:  Temp:  [97.5 F (36.4 C)-98.5 F (36.9 C)] 98.2 F (36.8 C) (06/11 0821) Pulse Rate:  [61-63] 62 (06/11 0821) Resp:  [15-22] 18 (06/11 0821) BP: (115-130)/(61-82) 126/61 (06/11 0821) SpO2:  [90 %-93 %] 92 % (06/11 1406) Weight:  [96.2 kg] 96.2 kg (06/11 0539)  Weight change: -3.319 kg Filed Weights   03/03/19 0437 03/04/19 0357 03/05/19 0539  Weight: 98.2 kg 99.5 kg 96.2 kg    Intake/Output:    Intake/Output Summary (Last 24 hours) at 03/05/2019 1534 Last data filed at 03/05/2019 1337 Gross per 24 hour  Intake 600 ml  Output 3600 ml  Net -3000 ml   Physical Exam: General:  Elderly gentleman, no acute distress  HEENT  anicteric, moist oral mucous membranes  Neck:  Supple  Lungs:  Minimal basilar rales, normal effort  Heart::  S1S2 no rubs  Abdomen:  Obese, soft, nontender  Extremities:  trace edema bilaterally  Neurologic:  Alert, oriented  Skin:  No acute rashes       Basic Metabolic Panel:  Recent Labs  Lab 03/01/19 0420 03/02/19 0447 03/03/19 0628 03/04/19 0338 03/05/19 0544  NA 137 142 141 142 143  K 5.0 4.7 4.3 4.0 3.7  CL 100 103 104 103 102  CO2 27 28 29 31  34*  GLUCOSE 205* 226* 154* 190* 118*  BUN 113* 116* 96* 85* 59*  CREATININE 2.38* 2.09* 1.76* 1.71* 1.45*  CALCIUM 8.3* 8.3* 8.3* 8.2* 8.3*     CBC: Recent Labs  Lab 03/01/19 0420 03/02/19 0447 03/02/19 2012 03/03/19 0628 03/04/19 0338 03/05/19 0544  WBC 12.4* 11.7*  --  13.2* 11.8* 13.3*  NEUTROABS 11.6* 10.5*  --  9.9* 8.9*  --   HGB 7.8* 7.4* 8.6* 8.3* 7.6* 8.7*  HCT 25.9* 24.9* 28.5* 27.0* 25.8* 28.6*  MCV 97.4 96.5  --  93.4 95.9 92.9  PLT 329 282  --  252 226 210     No results found for: HEPBSAG, HEPBSAB, HEPBIGM    Microbiology:  Recent Results (from the past 240  hour(s))  Aerobic Culture (superficial specimen)     Status: None   Collection Time: 02/27/19 10:41 AM   Specimen: Wound  Result Value Ref Range Status   Specimen Description   Final    WOUND LEFT THIGH Performed at [facility], [address] [place], [place] [address]    Special Requests   Final    NONE Performed at [facility], [place], [place] [address]    Gram Stain NO WBC SEEN NO ORGANISMS SEEN   Final   Culture   Final    NO GROWTH 2 DAYS Performed at [facility], [place] [address], [place], [place] [address]    Report Status 03/01/2019 FINAL  Final    Coagulation Studies: No results for input(s): LABPROT, INR in the last 72 hours.  Urinalysis: No results for input(s): COLORURINE, LABSPEC, PHURINE, GLUCOSEU, HGBUR, BILIRUBINUR, KETONESUR, PROTEINUR, UROBILINOGEN, NITRITE, LEUKOCYTESUR in the last 72 hours.  Invalid input(s): APPERANCEUR    Imaging: No results found.   Medications:   . sodium chloride Stopped (03/02/19 1001)   . sodium chloride   Intravenous Once  . amiodarone  200 mg Oral Daily  . apixaban  5 mg Oral BID  . aspirin  81 mg Oral Daily  . atorvastatin  80 mg Oral Daily  . brimonidine  1 drop Both Eyes TID  . cholecalciferol  1,000 Units Oral Daily  . guaiFENesin  600 mg Oral BID  . ipratropium-albuterol  3 mL Nebulization TID  . latanoprost  1 drop Both Eyes QHS  . magnesium oxide  400 mg Oral BID  . metoprolol  200 mg Oral Daily  . neomycin-bacitracin-polymyxin   Topical BID  . sodium chloride flush  3 mL Intravenous Q12H  . tiotropium  18 mcg Inhalation Daily  . torsemide  20 mg Oral BID  . vitamin B-12  1,000 mcg Oral Daily   sodium chloride, acetaminophen, diphenhydrAMINE, ipratropium-albuterol, nitroGLYCERIN, ondansetron (ZOFRAN) IV, sodium chloride flush  Assessment/ Plan:  73 y.o. African-American male with medical problems of coronary disease, history of PCI 2013, nonischemic dilated  cardiomyopathy, AICD 2006, aortic stenosis, Taylor 2018, atrial fibrillation on Eliquis, CKD stage III, COPD with 3 L supplemental oxygen, was admitted on 02/23/2019 with worsening shortness of breath and lower extremity edema.   1.  Acute kidney injury on chronic kidney disease stage III Baseline creatinine 1.65/GFR 47 at the time of admission on June 3rd.  Creatinine now down to 1.45 which is below his baseline.  Tolerating torsemide well.  Continue to periodically monitor renal parameters.  2.  Shortness of breath and volume overload with lower extremity edema Patient was initially on Lasix drip but now transitioned to torsemide and tolerating quite well.   LOS: 10 Kelsey Durflinger 6/11/20203:34 PM  [place], [place]  Note: This note was prepared with Dragon dictation. Any transcription errors are unintentional

## 2019-03-05 NOTE — Progress Notes (Signed)
Went over discharge instructions with the patient including medications and follow-up appointment. Discontinue PIV and telemetry monitor. Daughter is here to pick up patient, all belongings was sent with the patient.

## 2019-03-05 NOTE — TOC Transition Note (Signed)
Transition of Care [facility]) - CM/SW Discharge Note   Patient Details  Name: Darius Taylor MRN: [id number] Date of Birth: Nov 29, 1945  Transition of Care [facility]) CM/SW Contact:  Elza Rafter, RN Phone Number: 03/05/2019, 9:47 AM   Clinical Narrative:   Patient is discharging to home today.  Helene Kelp with [facility] is aware.  Brad with [employer] will deliver DME nebulizer today prior to discharge.      Final next level of care: [place] Barriers to Discharge: No Barriers Identified   Patient Goals and CMS Choice Patient states their goals for this hospitalization and ongoing recovery are:: return home with home health CMS Medicare.gov Compare Post Acute Care list provided to:: Patient Choice offered to / list presented to : Patient  Discharge Placement                       Discharge Plan and Services   Discharge Planning Services: CM Consult Post Acute Care Choice: Home Health          DME Arranged: Nebulizer/meds DME Agency: [employer]   Time DME Agency Contacted: [phone] Representative spoke with at DME Agency: [place]: RN, PT, Nurse's [place], OT, Social Work [employer] Agency: [facility] ([facility]) Date [place]: 03/05/19 Time [place]: [phone] Representative spoke with at [place]: [place] ([place]) Interventions     Readmission Risk Interventions Readmission Risk Prevention Plan 03/05/2019 02/26/2019 01/26/2019  Transportation Screening Complete Complete Complete  Medication Review Press photographer) Complete Complete Complete  PCP or Specialist appointment within 3-5 days of discharge Complete Complete Complete  HRI or Home Care Consult Complete Complete Complete  SW Recovery Care/Counseling Consult Complete Not Complete Not Complete  SW Consult Not Complete Comments - NA N/A  Palliative Care Screening Not Applicable Not Applicable Not [place] Not  Applicable Not Applicable Not Applicable  Some recent data might be hidden

## 2019-03-05 NOTE — TOC Progression Note (Addendum)
Transition of Care [facility]) - Progression Note    Patient Details  Name: Darius Taylor MRN: [id number] Date of Birth: [birth year]  Transition of Care [facility]) CM/SW Contact  Elza Rafter, RN Phone Number: 03/05/2019, 12:05 PM  Clinical Narrative:   Severe COPD and end stage emphysema; chronic respiratory failure with hypoxemia.  Patient continues to exhibit signs of hypercapnia associated with chronic respiratory failure secondary to severe COPD.  Patient requires the use of NIV both QHS and daytime to help with exacerbation periods.  The use of NIV will treat the patient's high PCO2 levels and can reduce risk of exacerbations and future hospitalizations when used at night and during the day.  Patient will need these advanced settings in conjunction with his current medication regimen; BIPAP with backup rate has been tried and failed.  Failure to have NIV available for use over a 24 hr period could lead to death.  Patient has been able to protect their airway and clear their own secretions.Geoffry Paradise, RNCM  [phone]      Expected Discharge Plan: [place] Barriers to Discharge: No Barriers Identified  Expected Discharge Plan and Services Expected Discharge Plan: [place]   Discharge Planning Services: CM Consult Post Acute Care Choice: Home Health   Expected Discharge Date: 03/05/19               DME Arranged: Nebulizer/meds DME Agency: [employer]   Time DME Agency Contacted: [phone] Representative spoke with at DME Agency: [place]: RN, PT, Nurse's Aide, OT, Social Work [employer] Agency: [facility] ([facility]) Date [place]: 03/05/19 Time [place]: [phone] Representative spoke with at [place]: [place] ([place]) Interventions    Readmission Risk Interventions Readmission Risk Prevention Plan 03/05/2019 02/26/2019 01/26/2019  Transportation Screening Complete  Complete Complete  Medication Review Press photographer) Complete Complete Complete  PCP or Specialist appointment within 3-5 days of discharge Complete Complete Complete  HRI or Home Care Consult Complete Complete Complete  SW Recovery Care/Counseling Consult Complete Not Complete Not Complete  SW Consult Not Complete Comments - NA N/A  Palliative Care Screening Not Applicable Not Applicable Not [place] Not Applicable Not Applicable Not Applicable  Some recent data might be hidden

## 2019-03-05 NOTE — Progress Notes (Signed)
Pulmonary Medicine          Date: 03/05/2019,   MRN# [id number] Darius Taylor 1946/06/29     AdmissionWeight: 97.5 kg                 CurrentWeight: 96.2 kg       SUBJECTIVE   Patient is doing well now is on his home O2 setting.  Worked with [place] with [employer] and patient is now set up for trilogy noninvasive ventilation for patient to use at home, also small portable 1.8 pound O2 concentrator for home use so patient is able to ambulate with oxygen.  Most recent PFTs done on this admission show FEV1 to FVC ratio less than 70% with 49% FEV1   PAST MEDICAL HISTORY   Past Medical History:  Diagnosis Date  . Aortic stenosis   . Arrhythmia    atrial fibrillation  . Asthma   . Atrial fibrillation ([place])   . CAD (coronary artery disease)   . Cancer [facility])    colon, bladder and kidney  . CHF (congestive heart failure) ([place])   . 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 COPD (chronic obstructive pulmonary disease) ([place])   . H/O ventricular tachycardia   . Hypertension   . Nonischemic cardiomyopathy ([place])    Status post AICD and pacemaker  . Renal cell carcinoma ([place])      SURGICAL HISTORY   Past Surgical History:  Procedure Laterality Date  . CARDIAC DEFIBRILLATOR PLACEMENT    . CARDIAC DEFIBRILLATOR PLACEMENT    . COLONOSCOPY WITH PROPOFOL N/A 06/10/2018   Procedure: COLONOSCOPY WITH PROPOFOL;  Surgeon: Lin Landsman, MD;  Location: [facility] ENDOSCOPY;  Service: Gastroenterology;  Laterality: N/A;  . CORONARY ANGIOPLASTY  09/25/2016  . IRRIGATION AND DEBRIDEMENT HEMATOMA Left 10/24/2016   Procedure: IRRIGATION AND DEBRIDEMENT HEMATOMA;  Surgeon: Florene Glen, MD;  Location: [facility] ORS;  Service: General;  Laterality: Left;  . KIDNEY SURGERY     left ne[hrectomy for RCC  . PACEMAKER IMPLANT    . PARTIAL COLECTOMY       FAMILY HISTORY   Family History  Problem Relation Age of Onset  . Hypertension Mother   . Stroke Father      SOCIAL HISTORY    Social History   Tobacco Use  . Smoking status: Former Research scientist (life sciences)  . Smokeless tobacco: Never Used  Substance Use Topics  . Alcohol use: No  . Drug use: No     MEDICATIONS    Home Medication:  Current Outpatient Rx  . Order #: [id number] Class: Normal  . Order #: [id number] Class: Normal  . Order #: [id number] Class: Normal    Current Medication:  Current Facility-Administered Medications:  .  0.9 %  sodium chloride infusion (Manually program via Guardrails IV Fluids), , Intravenous, Once, [place], Bing Neighbors, NP .  0.9 %  sodium chloride infusion, 250 mL, Intravenous, PRN, Stark Jock, Jude, MD, Stopped at 03/02/19 1001 .  acetaminophen (TYLENOL) tablet 650 mg, 650 mg, Oral, Q4H PRN, Stark Jock, Jude, MD, 650 mg at 03/05/19 0542 .  amiodarone (PACERONE) tablet 200 mg, 200 mg, Oral, Daily, Oj[place], Jude, MD, 200 mg at 03/05/19 1015 .  apixaban (ELIQUIS) tablet 5 mg, 5 mg, Oral, BID, Ojie, Jude, MD, 5 mg at 03/05/19 1021 .  aspirin chewable tablet 81 mg, 81 mg, Oral, Daily, Ojie, Jude, MD, 81 mg at 03/05/19 1015 .  atorvastatin (LIPITOR) tablet 80 mg, 80 mg, Oral, Daily, Ojie, Jude, MD, 80 mg at  03/05/19 1015 .  brimonidine (ALPHAGAN) 0.15 % ophthalmic solution 1 drop, 1 drop, Both Eyes, TID, Ojie, Jude, MD, 1 drop at 03/05/19 1546 .  cholecalciferol (VITAMIN D) tablet 1,000 Units, 1,000 Units, Oral, Daily, Ojie, Jude, MD, 1,000 Units at 03/05/19 1015 .  diphenhydrAMINE (BENADRYL) capsule 25 mg, 25 mg, Oral, QHS PRN, Lance Coon, MD, 25 mg at 02/28/19 2207 .  guaiFENesin (MUCINEX) 12 hr tablet 600 mg, 600 mg, Oral, BID, Dustin Flock, MD, 600 mg at 03/05/19 1015 .  ipratropium-albuterol (DUONEB) 0.5-2.5 (3) MG/3ML nebulizer solution 3 mL, 3 mL, Nebulization, TID, Dustin Flock, MD, 3 mL at 03/05/19 1404 .  ipratropium-albuterol (DUONEB) 0.5-2.5 (3) MG/3ML nebulizer solution 3 mL, 3 mL, Nebulization, Q4H PRN, Dustin Flock, MD, 3 mL at 03/01/19 1640 .  latanoprost (XALATAN) 0.005 % ophthalmic  solution 1 drop, 1 drop, Both Eyes, QHS, Ojie, Jude, MD, 1 drop at 03/04/19 2113 .  magnesium oxide (MAG-OX) tablet 400 mg, 400 mg, Oral, BID, Ojie, Jude, MD, 400 mg at 03/05/19 1015 .  metoprolol succinate (TOPROL-XL) 24 hr tablet 200 mg, 200 mg, Oral, Daily, Ojie, Jude, MD, 200 mg at 03/05/19 1015 .  neomycin-bacitracin-polymyxin (NEOSPORIN) ointment, , Topical, BID, Dustin Flock, MD .  nitroGLYCERIN (NITROSTAT) SL tablet 0.4 mg, 0.4 mg, Sublingual, Q5 min PRN, Ojie, Jude, MD .  ondansetron (ZOFRAN) injection 4 mg, 4 mg, Intravenous, Q6H PRN, Ojie, Jude, MD .  sodium chloride flush (NS) 0.9 % injection 3 mL, 3 mL, Intravenous, Q12H, Ojie, Jude, MD, 3 mL at 03/05/19 1021 .  sodium chloride flush (NS) 0.9 % injection 3 mL, 3 mL, Intravenous, PRN, Ojie, Jude, MD .  tiotropium (SPIRIVA) inhalation capsule ([facility] use ONLY) 18 mcg, 18 mcg, Inhalation, Daily, Ojie, Jude, MD, 18 mcg at 03/05/19 0800 .  torsemide (DEMADEX) tablet 20 mg, 20 mg, Oral, BID, Lateef, Munsoor, MD, 20 mg at 03/05/19 1801 .  vitamin B-12 (CYANOCOBALAMIN) tablet 1,000 mcg, 1,000 mcg, Oral, Daily, Dustin Flock, MD, 1,000 mcg at 03/05/19 1015    ALLERGIES   Patient has no known allergies.     REVIEW OF SYSTEMS    Review of Systems:  Gen:  Denies  fever, sweats, chills weigh loss  HEENT: Denies blurred vision, double vision, ear pain, eye pain, hearing loss, nose bleeds, sore throat Cardiac:  No dizziness, chest pain or heaviness, chest tightness,edema Resp:   Denies cough or sputum porduction, shortness of breath,wheezing, hemoptysis,  Gi: Denies swallowing difficulty, stomach pain, nausea or vomiting, diarrhea, constipation, bowel incontinence Gu:  Denies bladder incontinence, burning urine Ext:   Denies Joint pain, stiffness or swelling Skin: Denies  skin rash, easy bruising or bleeding or hives Endoc:  Denies polyuria, polydipsia , polyphagia or weight change Psych:   Denies depression, insomnia or  hallucinations   Other:  All other systems negative   VS: BP 113/75 (BP Location: Right Arm)   Pulse 63   Temp 98.6 F (37 C) (Oral)   Resp 20   Ht 5\' 11"  (1.803 m)   Wt 96.2 kg   SpO2 94%   BMI 29.58 kg/m      PHYSICAL EXAM    GENERAL:NAD, no fevers, chills, no weakness no fatigue HEAD: Normocephalic, atraumatic.  EYES: Pupils equal, round, reactive to light. Extraocular muscles intact. No scleral icterus.  MOUTH: Moist mucosal membrane. Dentition intact. No abscess noted.  EAR, NOSE, THROAT: Clear without exudates. No external lesions.  NECK: Supple. No thyromegaly. No nodules. No JVD.  PULMONARY: Diffuse coarse  rhonchi right sided +wheezes CARDIOVASCULAR: S1 and S2. Regular rate and rhythm. No murmurs, rubs, or gallops. No edema. Pedal pulses 2+ bilaterally.  GASTROINTESTINAL: Soft, nontender, nondistended. No masses. Positive bowel sounds. No hepatosplenomegaly.  MUSCULOSKELETAL: No swelling, clubbing, or edema. Range of motion full in all extremities.  NEUROLOGIC: Cranial nerves II through XII are intact. No gross focal neurological deficits. Sensation intact. Reflexes intact.  SKIN: No ulceration, lesions, rashes, or cyanosis. Skin warm and dry. Turgor intact.  PSYCHIATRIC: Mood, affect within normal limits. The patient is awake, alert and oriented x 3. Insight, judgment intact.       IMAGING    Dg Chest Port 1 View  Result Date: 03/01/2019 CLINICAL DATA:  Intermittent hypoxia. EXAM: PORTABLE CHEST 1 VIEW COMPARISON:  02/23/2019 FINDINGS: Left-sided pacemaker unchanged. Lungs are adequately inflated and demonstrate slight improvement and mild hazy perihilar attenuation of the vessels suggesting improving interstitial edema. Persistent small left effusion. Stable moderate cardiomegaly. Remainder the exam is unchanged. IMPRESSION: Findings suggesting slight interval improvement with residual mild vascular congestion and small left effusion. Stable cardiomegaly.  Electronically Signed   By: Marin Olp M.D.   On: 03/01/2019 17:26   Dg Chest Port 1 View  Result Date: 02/23/2019 CLINICAL DATA:  Shortness of breath EXAM: PORTABLE CHEST 1 VIEW COMPARISON:  Jan 28, 2019 and January 09, 2019 FINDINGS: There is interstitial edema throughout the lungs, essentially stable. There are small pleural effusions bilaterally. There is no frank airspace consolidation. There is cardiomegaly with pulmonary venous hypertension. Pacemaker leads are attached to the right atrium and right ventricle. There is aortic atherosclerosis. No adenopathy. No bone lesions. IMPRESSION: Pulmonary vascular congestion with small pleural effusions and interstitial edema. Appearance is felt to be indicative of a degree of congestive heart failure. There may well be superimposed noncardiogenic interstitial pneumonitis, although there is no frank airspace consolidation. Pacemaker leads attached to right atrium and right ventricle. Aortic Atherosclerosis (ICD10-I70.0). Electronically Signed   By: Lowella Grip III M.D.   On: 02/23/2019 13:25      ASSESSMENT/PLAN     Acute on chronic hypoxemic respiratory failure due to Acute on chronic systolic and diastolic decompensated CHF with EF 50 to 32% complicated by advanced COPD with chronic hypoxemia, severe anemia and possible pulmonary hypertension suggested by severely elevated RVSP most recent echo -Agree with diuresis- nephro on case s/p PRBC transfusion- -Cardiology on case appreciate input -Strict I's and O's -Fluid intakerestirction 1200cc-patient continues to drink high volume fluids  Complicated by CKD - renal team on case - appreciate input-torsemide 20 bid    Advanced COPD -continue COPD carepath, will d/c pulmicort. Continue duoneb q6 while awake -IS at bedsie, ordering MetaNeb to help with recruitment -Acapella      Thank you for allowing me to participate in the care of this patient.   Patient/Family are satisfied  with care plan and all questions have been answered.  This document was prepared using Dragon voice recognition software and may include unintentional dictation errors.     Ottie Glazier, M.D.  Division of [place]

## 2019-03-05 NOTE — Plan of Care (Signed)
  Problem: Clinical Measurements: Goal: Diagnostic test results will improve Outcome: Progressing Note: BUN & creatinine now trending down Goal: Respiratory complications will improve Outcome: Progressing Note: On 4L HFNC   Problem: Activity: Goal: Risk for activity intolerance will decrease Outcome: Progressing   Problem: Nutrition: Goal: Adequate nutrition will be maintained Outcome: Progressing   Problem: Coping: Goal: Level of anxiety will decrease Outcome: Progressing   Problem: Elimination: Goal: Will not experience complications related to urinary retention Outcome: Progressing Note: Very good output throughout the night, pt now on PO torsemide   Problem: Pain Managment: Goal: General experience of comfort will improve Outcome: Progressing Note: Complaints of headache once, treated with tylenol which gave relief   Problem: Safety: Goal: Ability to remain free from injury will improve Outcome: Progressing   Problem: Education: Goal: Ability to demonstrate management of disease process will improve Outcome: Not Progressing Note: Pt still no understanding his fluid restriction and that he can't just drink whatever he wants, re-educated that he is limited to 1253mL per day, to maintain his heart failure

## 2019-03-05 NOTE — Care Management Important Message (Signed)
Important Message  Patient Details  Name: Darius Taylor MRN: [id number] Date of Birth: [birth year]   Medicare Important Message Given:  Yes    Dannette Barbara 03/05/2019, 1:17 PM

## 2019-03-05 NOTE — Progress Notes (Signed)
Bedside Spirometry Results   FVC %Pred  FEV1 %Pred   2.39        65%  1.39   49%  Patient gave his best effort at testing and followed directions well.

## 2019-03-05 NOTE — Plan of Care (Signed)
  Problem: Education: Goal: Knowledge of General Education information will improve Description: Including pain rating scale, medication(s)/side effects and non-pharmacologic comfort measures Outcome: Progressing   Problem: Health Behavior/Discharge Planning: Goal: Ability to manage health-related needs will improve Outcome: Progressing   Problem: Clinical Measurements: Goal: Ability to maintain clinical measurements within normal limits will improve Outcome: Progressing Goal: Will remain free from infection Outcome: Progressing Note: Remains afebrile Goal: Cardiovascular complication will be avoided Outcome: Progressing   Problem: Activity: Goal: Risk for activity intolerance will decrease Outcome: Progressing   Problem: Clinical Measurements: Goal: Diagnostic test results will improve Outcome: Not Progressing Note: BUN 59/1.45 Hgb 8.7/28.6 Goal: Respiratory complications will improve Outcome: Not Progressing Note: Remains on 4LO2 per [place]

## 2019-03-05 NOTE — Discharge Summary (Addendum)
D[facility] at [place] NAME: Darius [facility]    MR#:  [id number]  DATE OF BIRTH:  1946-01-26  DATE OF ADMISSION:  02/23/2019   ADMITTING PHYSICIAN: Jude Ojie, MD  DATE OF DISCHARGE: 03/05/19  PRIMARY CARE PHYSICIAN: [facility], [facility]   ADMISSION DIAGNOSIS:   Acute pulmonary edema ([facility]) [J81.0] Elevated troponin [R79.89] Acute on chronic congestive heart failure, unspecified heart failure type ([facility]) [I50.9]  DISCHARGE DIAGNOSIS:   Active Problems:   COPD exacerbation (HCC)   Acute on chronic diastolic CHF (congestive heart failure) (HCC)   Goals of care, counseling/discussion   Palliative care encounter   SECONDARY DIAGNOSIS:   Past Medical History:  Diagnosis Date  . Aortic stenosis   . Arrhythmia    atrial fibrillation  . Asthma   . Atrial fibrillation ([facility])   . CAD (coronary artery disease)   . Cancer [facility])    colon, bladder and kidney  . CHF (congestive heart failure) (Darius Taylor)   . 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 COPD (chronic obstructive pulmonary disease) (D[facility])   . H/O ventricular tachycardia   . Hypertension   . Nonischemic cardiomyopathy (Darius Taylor)    Status post AICD and pacemaker  . Renal cell carcinoma [facility])     HOSPITAL COURSE:  73 y.o. male with history of chronic atrial fibrillation on anticoagulation with Eliquis,chronic kidney disease stage III,COPD,prior history of AICD,chronic diastolic CHF and prior history of asthma admitted with acute hypoxic respiratory failure secondary to CHF exacerbation.  1.Acute on chronic respiratory failure with Hypoxia secondary to CHF exacerbation and severe Acute on Chronic COPD - Continue supplemental  Oxygen with goal stas>88% - Continue nebs   2.Acute on chronic diastolic CHF exacerbation. - Last Echo 10/2018 with  EF 50-55% - Beta-Blockade: Metoprolol - Diuretics:Torsemide po.  - Low salt diet  - Follow with Cardiology and CHF clinic as scheduled   3. Severe Acute on Chronic COPD - Duo nebs every 6 at discharge - Supplemental oxygen as needed - Palliative following  4. Chronic Anemia-Irons studies suggested iron deficiency with no acute bleeding.  - received 1 dose of IV Venofer, and I unit of PRBC on 01/2019, 6/9 and  6/10 -  hemoglobin improved at 8.7 on discharge - Follow up with PCP for monitoring  5.Left thigh Cellulitis - no worsening symptoms - Treated with Augmentin  6.Chronic atrial fibrillation -rate controlled on Amiodarone + Metoprolol - Continue Eliquis  7.Acute on chronic kidney disease stage III - slight improvement in creatine today -Follow with Nephrology as scheduled  DISCHARGE CONDITIONS:   stable CONSULTS OBTAINED:   Treatment Team:  Isaias Cowman, MD Ottie Glazier, MD Murlean Iba, MD  DRUG ALLERGIES:   No Known Allergies DISCHARGE MEDICATIONS:   Allergies as of 03/05/2019   No Known Allergies     Medication List    STOP taking these medications   allopurinol 100 MG tablet Commonly known as: ZYLOPRIM   amLODipine 10 MG tablet Commonly known as: NORVASC   budesonide-formoterol 160-4.5 MCG/ACT inhaler Commonly known as: SYMBICORT   furosemide 40 MG tablet Commonly known as: Lasix   spironolactone 25 MG tablet Commonly known as: ALDACTONE     TAKE these medications   acetaminophen 325 MG tablet Commonly known as: TYLENOL Take 650 mg by mouth every 6 (six) hours as needed for mild pain or fever.   amiodarone 200 MG tablet Commonly known as: PACERONE Take 200 mg by mouth daily.   aspirin 81  MG chewable tablet Chew 81 mg by mouth daily.   atorvastatin 80 MG tablet Commonly known as: LIPITOR Take 80 mg by mouth daily.   brimonidine 0.15 % ophthalmic solution Commonly known as: ALPHAGAN Place 1 drop into both eyes 3 (three) times daily.   cholecalciferol 1000 units tablet Commonly known as: VITAMIN D Take 1,000 Units by mouth daily.    cyanocobalamin 1000 MCG tablet Take 1 tablet (1,000 mcg total) by mouth daily.   Eliquis 5 MG Tabs tablet Generic drug: apixaban Take 5 mg by mouth 2 (two) times daily.   ipratropium-albuterol 0.5-2.5 (3) MG/3ML Soln Commonly known as: DUONEB Take 3 mLs by nebulization 3 (three) times daily.   latanoprost 0.005 % ophthalmic solution Commonly known as: XALATAN Place 1 drop into both eyes at bedtime.   magnesium oxide 400 MG tablet Commonly known as: MAG-OX Take 400 mg by mouth 2 (two) times daily.   metoprolol 200 MG 24 hr tablet Commonly known as: TOPROL-XL Take 200 mg by mouth daily.   nitroGLYCERIN 0.4 MG SL tablet Commonly known as: NITROSTAT Place 0.4 mg under the tongue every 5 (five) minutes as needed for chest pain.   tiotropium 18 MCG inhalation capsule Commonly known as: SPIRIVA Place 18 mcg into inhaler and inhale daily.   torsemide 20 MG tablet Commonly known as: DEMADEX Take 1 tablet (20 mg total) by mouth 2 (two) times daily.            Durable Medical Equipment  (From admission, onward)         Start     Ordered   03/05/19 0000  DME Nebulizer machine    Question:  Patient needs a nebulizer to treat with the following condition  Answer:  Advanced COPD ([place])   03/05/19 0920   02/27/19 1833  For home use only DME oxygen  Once    Comments: In home assessment for POC with oxygen conserving device.  [employer] is aware of this order and will arrange  Question Answer Comment  Length of Need Lifetime   Oxygen conserving device Yes   Oxygen delivery system Gas      02/27/19 1833          DISCHARGE INSTRUCTIONS:    DIET:   Renal diet  ACTIVITY:   Activity as tolerated  OXYGEN:   Home Oxygen: Yes.    Oxygen Delivery: 3 liters/min via Patient connected to nasal cannula oxygen  DISCHARGE LOCATION:   home   If you experience worsening of your admission symptoms, develop shortness of breath, life threatening emergency, suicidal or  homicidal thoughts you must seek medical attention immediately by calling 911 or calling your MD immediately  if symptoms less severe.  You Must read complete instructions/literature along with all the possible adverse reactions/side effects for all the Medicines you take and that have been prescribed to you. Take any new Medicines after you have completely understood and accpet all the possible adverse reactions/side effects.   Please note  You were cared for by a hospitalist during your hospital stay. If you have any questions about your discharge medications or the care you received while you were in the hospital after you are discharged, you can call the unit and asked to speak with the hospitalist on call if the hospitalist that took care of you is not available. Once you are discharged, your primary care physician will handle any further medical issues. Please note that NO REFILLS for any discharge medications will be authorized  once you are discharged, as it is imperative that you return to your primary care physician (or establish a relationship with a primary care physician if you do not have one) for your aftercare needs so that they can reassess your need for medications and monitor your lab values.  On the day of Discharge:  VITAL SIGNS:   Blood pressure 126/61, pulse 62, temperature 98.2 F (36.8 C), temperature source Oral, resp. rate 18, height 5\' 11"  (1.803 m), weight 96.2 kg, SpO2 92 %.  PHYSICAL EXAMINATION:    GENERAL:  73 y.o.-year-old patient lying in the bed with no acute distress.  EYES: Pupils equal, round, reactive to light and accommodation. No scleral icterus. Extraocular muscles intact.  HEENT: Head atraumatic, normocephalic. Oropharynx and nasopharynx clear.  NECK:  Supple, no jugular venous distention. No thyroid enlargement, no tenderness.  LUNGS: Normal breath sounds bilaterally, no wheezing, rales,rhonchi or crepitation. No use of accessory muscles of  respiration.  CARDIOVASCULAR: S1, S2 normal. No murmurs, rubs, or gallops.  ABDOMEN: Soft, non-tender, non-distended. Bowel sounds present. No organomegaly or mass.  EXTREMITIES: No pedal edema, cyanosis, or clubbing.  NEUROLOGIC: Cranial nerves II through XII are intact. Muscle strength 5/5 in all extremities. Sensation intact. Gait not checked.  PSYCHIATRIC: The patient is alert and oriented x 3.  SKIN: No obvious rash, lesion, or ulcer.   DATA REVIEW:   CBC Recent Labs  Lab 03/05/19 0544  WBC 13.3*  HGB 8.7*  HCT 28.6*  PLT 210    Chemistries  Recent Labs  Lab 03/05/19 0544  NA 143  K 3.7  CL 102  CO2 34*  GLUCOSE 118*  BUN 59*  CREATININE 1.45*  CALCIUM 8.3*     Microbiology Results  Results for orders placed or performed during the hospital encounter of 02/23/19  SARS Coronavirus 2 (CEPHEID- Performed in [place] hospital lab), Hosp Order     Status: None   Collection Time: 02/23/19  1:05 PM   Specimen: Nasopharyngeal Swab  Result Value Ref Range Status   SARS Coronavirus 2 NEGATIVE NEGATIVE Final    Comment: (NOTE) If result is NEGATIVE SARS-CoV-2 target nucleic acids are NOT DETECTED. The SARS-CoV-2 RNA is generally detectable in upper and lower  respiratory specimens during the acute phase of infection. The lowest  concentration of SARS-CoV-2 viral copies this assay can detect is 250  copies / mL. A negative result does not preclude SARS-CoV-2 infection  and should not be used as the sole basis for treatment or other  patient management decisions.  A negative result may occur with  improper specimen collection / handling, submission of specimen other  than nasopharyngeal swab, presence of viral mutation(s) within the  areas targeted by this assay, and inadequate number of viral copies  (<250 copies / mL). A negative result must be combined with clinical  observations, patient history, and epidemiological information. If result is POSITIVE SARS-CoV-2  target nucleic acids are DETECTED. The SARS-CoV-2 RNA is generally detectable in upper and lower  respiratory specimens dur ing the acute phase of infection.  Positive  results are indicative of active infection with SARS-CoV-2.  Clinical  correlation with patient history and other diagnostic information is  necessary to determine patient infection status.  Positive results do  not rule out bacterial infection or co-infection with other viruses. If result is PRESUMPTIVE POSTIVE SARS-CoV-2 nucleic acids MAY BE PRESENT.   A presumptive positive result was obtained on the submitted specimen  and confirmed on repeat testing.  While 2019 novel coronavirus  (SARS-CoV-2) nucleic acids may be present in the submitted sample  additional confirmatory testing may be necessary for epidemiological  and / or clinical management purposes  to differentiate between  SARS-CoV-2 and other Sarbecovirus currently known to infect humans.  If clinically indicated additional testing with an alternate test  methodology [phone]) is advised. The SARS-CoV-2 RNA is generally  detectable in upper and lower respiratory sp ecimens during the acute  phase of infection. The expected result is Negative. Fact Sheet for Patients:  StrictlyIdeas.no Fact Sheet for Healthcare Providers: BankingDealers.co.za This test is not yet approved or cleared by the [place] FDA and has been authorized for detection and/or diagnosis of SARS-CoV-2 by FDA under an Emergency Use Authorization (EUA).  This EUA will remain in effect (meaning this test can be used) for the duration of the COVID-19 declaration under Section 564(b)(1) of the Act, 21 U.S.C. section 360bbb-3(b)(1), unless the authorization is terminated or revoked sooner. Performed at [facility], [address] [place], [place] [address]   Blood culture (routine x 2)     Status: None   Collection Time: 02/23/19   1:05 PM   Specimen: BLOOD  Result Value Ref Range Status   Specimen Description BLOOD LEFT ANTECUBITAL  Final   Special Requests   Final    BOTTLES DRAWN AEROBIC ONLY Blood Culture results may not be optimal due to an inadequate volume of blood received in culture bottles   Culture   Final    NO GROWTH 5 DAYS Performed at [facility], [address] [place], [place] [address]    Report Status 02/28/2019 FINAL  Final  Blood culture (routine x 2)     Status: None   Collection Time: 02/23/19  1:05 PM   Specimen: BLOOD  Result Value Ref Range Status   Specimen Description BLOOD RIGHT ANTECUBITAL  Final   Special Requests   Final    BOTTLES DRAWN AEROBIC AND ANAEROBIC Blood Culture adequate volume   Culture   Final    NO GROWTH 5 DAYS Performed at [facility], [address], [place], [place] [address]    Report Status 02/28/2019 FINAL  Final  Aerobic Culture (superficial specimen)     Status: None   Collection Time: 02/27/19 10:41 AM   Specimen: Wound  Result Value Ref Range Status   Specimen Description   Final    WOUND LEFT THIGH Performed at [facility], [place] [address], [place], [place] [address]    Special Requests   Final    NONE Performed at [facility], [place], [place] [address]    Gram Stain NO WBC SEEN NO ORGANISMS SEEN   Final   Culture   Final    NO GROWTH 2 DAYS Performed at [facility], [place] [address] [name], [place] [address]    Report Status 03/01/2019 FINAL  Final    RADIOLOGY:  No results found.   Management plans discussed with the patient, family and they are in agreement.  CODE STATUS:     Code Status Orders  (From admission, onward)         Start     Ordered   02/23/19 1507  Full code  Continuous     02/23/19 1509        Code Status History    Date Active Date Inactive Code Status Order ID Comments User Context   01/25/2019 1855 01/30/2019 1905  Full Code [id number]   Otila Back, MD ED   12/30/2018 0224 01/21/2019 1931 Full Code [id number]  Lance Coon, MD Inpatient   11/03/2018 2032 11/12/2018 2045 Full Code [id number]  Gladstone Lighter, MD Inpatient   08/30/2018 1045 09/02/2018 1616 Full Code [id number]  Fritzi Mandes, MD Inpatient   08/22/2018 1435 08/25/2018 2257 Full Code [id number]  Fritzi Mandes, MD Inpatient   01/08/2018 1523 01/11/2018 1712 Full Code [id number]  Nicholes Mango, MD ED   11/18/2017 2227 11/23/2017 2033 Full Code [id number]  Vaughan Basta, MD Inpatient   10/21/2016 1216 10/26/2016 2247 Full Code [id number]  Bettey Costa, MD Inpatient   Advance Care Planning Activity      TOTAL TIME TAKING CARE OF THIS PATIENT: 36 minutes.   This patient was staffed with Dr. Valetta Fuller, [facility] who personally evaluated patient, reviewed documentation and agreed with discharge plan of care as above.  Rufina Falco, DNP, FNP-BC Hospitalist Nurse Practitioner   Karen Kays on 03/05/2019 at 4:58 PM  Between 7am to 6pm - Pager - [phone]  After 6pm go to [email or web address] - Technical brewer [facility]  Office  [phone]  CC: Primary care physician; [employer], [employer]   Note: This dictation was prepared with Diplomatic Taylor operational officer dictation along with smaller Company secretary. Any transcriptional errors that result from this process are unintentional.

## 2019-03-06 ENCOUNTER — Ambulatory Visit: Payer: Medicare HMO | Admitting: Family

## 2019-03-10 ENCOUNTER — Encounter (HOSPITAL_COMMUNITY): Payer: Self-pay

## 2019-03-10 ENCOUNTER — Other Ambulatory Visit (HOSPITAL_COMMUNITY): Payer: Self-pay

## 2019-03-10 NOTE — Progress Notes (Signed)
Today was a home visit.  Tell states been doing good, he is on 3 lpm oxygen 24/7.  He now has a Marine scientist.  He is aware of appt with HF clinic tomorrow.  He states been sleeping good.  He takes naps in the day.  He states appetite is good.  Has some swelling in ankles and feet.  He has them resting on the floor, advised him to prop them up when sitting.  We discussed diet and discussed fluid intake.  His meds verified and he is aware of how to take them.  He has some rhonchi in lower right side clear in upper.  He states breathing has been good, he states not getting worst since been home.  He denies chest pain, headaches or dizziness.  He states been urinating fine.  Will continue to visit for heart failure and medication compliance.   [place] [phone]

## 2019-03-11 ENCOUNTER — Other Ambulatory Visit: Payer: Self-pay

## 2019-03-11 ENCOUNTER — Encounter: Payer: Self-pay | Admitting: Family

## 2019-03-11 ENCOUNTER — Ambulatory Visit: Payer: Medicare HMO | Attending: Family | Admitting: Family

## 2019-03-11 VITALS — BP 136/68 | HR 64 | Temp 98.7°F | Resp 18 | Ht 71.0 in | Wt 215.0 lb

## 2019-03-11 DIAGNOSIS — I5032 Chronic diastolic (congestive) heart failure: Secondary | ICD-10-CM | POA: Diagnosis not present

## 2019-03-11 DIAGNOSIS — I110 Hypertensive heart disease with heart failure: Secondary | ICD-10-CM | POA: Insufficient documentation

## 2019-03-11 DIAGNOSIS — Z85038 Personal history of other malignant neoplasm of large intestine: Secondary | ICD-10-CM | POA: Insufficient documentation

## 2019-03-11 DIAGNOSIS — Z85528 Personal history of other malignant neoplasm of kidney: Secondary | ICD-10-CM | POA: Insufficient documentation

## 2019-03-11 DIAGNOSIS — Z7901 Long term (current) use of anticoagulants: Secondary | ICD-10-CM | POA: Insufficient documentation

## 2019-03-11 DIAGNOSIS — I890 Lymphedema, not elsewhere classified: Secondary | ICD-10-CM | POA: Diagnosis not present

## 2019-03-11 DIAGNOSIS — Z79899 Other long term (current) drug therapy: Secondary | ICD-10-CM | POA: Insufficient documentation

## 2019-03-11 DIAGNOSIS — I10 Essential (primary) hypertension: Secondary | ICD-10-CM

## 2019-03-11 DIAGNOSIS — Z87891 Personal history of nicotine dependence: Secondary | ICD-10-CM | POA: Insufficient documentation

## 2019-03-11 DIAGNOSIS — Z7982 Long term (current) use of aspirin: Secondary | ICD-10-CM | POA: Insufficient documentation

## 2019-03-11 DIAGNOSIS — Z950 Presence of cardiac pacemaker: Secondary | ICD-10-CM | POA: Insufficient documentation

## 2019-03-11 DIAGNOSIS — D649 Anemia, unspecified: Secondary | ICD-10-CM | POA: Diagnosis not present

## 2019-03-11 DIAGNOSIS — I4891 Unspecified atrial fibrillation: Secondary | ICD-10-CM | POA: Diagnosis not present

## 2019-03-11 DIAGNOSIS — I350 Nonrheumatic aortic (valve) stenosis: Secondary | ICD-10-CM | POA: Insufficient documentation

## 2019-03-11 DIAGNOSIS — J449 Chronic obstructive pulmonary disease, unspecified: Secondary | ICD-10-CM

## 2019-03-11 DIAGNOSIS — I2510 Atherosclerotic heart disease of native coronary artery without angina pectoris: Secondary | ICD-10-CM | POA: Diagnosis not present

## 2019-03-11 DIAGNOSIS — Z8249 Family history of ischemic heart disease and other diseases of the circulatory system: Secondary | ICD-10-CM | POA: Diagnosis not present

## 2019-03-11 DIAGNOSIS — J441 Chronic obstructive pulmonary disease with (acute) exacerbation: Secondary | ICD-10-CM | POA: Diagnosis not present

## 2019-03-11 NOTE — Progress Notes (Signed)
Patient ID: Darius [facility], male    DOB: 1946-04-27, 73 y.o.   MRN: [id number]  HPI  [name] is a 73 y/o male with a history of CAD, COPD, HTN, asthma, colon/ renal cancer, atrial fibrillation, previous tobacco use and chronic heart failure.   Echo report from 11/04/2018 reviewed and showed an EF of 50-55% along with moderate TR and mild AR. Echo report from 03/29/17 reviewed and showed an EF of 55-60% along with an elevated PA pressure of 71 mmHg.  Admitted 02/23/2019 due to hypoxia secondary to HF/ COPD exacerbation. Palliative care, nephrology, pulmonology and cardiology consults obtained. Diuresed, given nebulizer & supplemental oxygen. Received 1 unit of PRBC's due to anemia. Discharged after 10 days.   He presents today for a follow-up visit with a chief complaint of moderate shortness of breath upon minimal exertion. He describes this as chronic in nature having been present for several years. He has associated fatigue, pedal edema and light-headedness along with this. He denies any difficulty sleeping, abdominal distention, palpitations, chest pain or weight gain.   He has had multiple hospitalizations since he was last here 6 months ago. O2 saturation on 4L was initially 83% with his mask on and he said he was feeling more short of breath. Patient removed his mask, shortness of breath improved and O2 sat rose to 90% very quickly. Provider and CMA remained masked.   Past Medical History:  Diagnosis Date  . Aortic stenosis   . Arrhythmia    atrial fibrillation  . Asthma   . Atrial fibrillation ([place])   . CAD (coronary artery disease)   . Cancer [facility])    colon, bladder and kidney  . CHF (congestive heart failure) ([place])   . Colon adenocarcinoma ([place])   . COPD (chronic obstructive pulmonary disease) ([place])   . H/O ventricular tachycardia   . Hypertension   . Nonischemic cardiomyopathy ([place])    Status post AICD and pacemaker  . Renal cell carcinoma [facility])    Past Surgical History:   Procedure Laterality Date  . CARDIAC DEFIBRILLATOR PLACEMENT    . CARDIAC DEFIBRILLATOR PLACEMENT    . COLONOSCOPY WITH PROPOFOL N/A 06/10/2018   Procedure: COLONOSCOPY WITH PROPOFOL;  Surgeon: Lin Landsman, MD;  Location: [facility] ENDOSCOPY;  Service: Gastroenterology;  Laterality: N/A;  . CORONARY ANGIOPLASTY  09/25/2016  . IRRIGATION AND DEBRIDEMENT HEMATOMA Left 10/24/2016   Procedure: IRRIGATION AND DEBRIDEMENT HEMATOMA;  Surgeon: Florene Glen, MD;  Location: [facility] ORS;  Service: General;  Laterality: Left;  . KIDNEY SURGERY     left ne[hrectomy for RCC  . PACEMAKER IMPLANT    . PARTIAL COLECTOMY     Family History  Problem Relation Age of Onset  . Hypertension Mother   . Stroke Father    Social History   Tobacco Use  . Smoking status: Former Research scientist (life sciences)  . Smokeless tobacco: Never Used  Substance Use Topics  . Alcohol use: No   No Known Allergies  Prior to Admission medications   Medication Sig Start Date End Date Taking? Authorizing Provider  acetaminophen (TYLENOL) 325 MG tablet Take 650 mg by mouth every 6 (six) hours as needed for mild pain or fever.    Yes [provider]  amiodarone (PACERONE) 200 MG tablet Take 200 mg by mouth daily.   Yes [provider]  apixaban (ELIQUIS) 5 MG TABS tablet Take 5 mg by mouth 2 (two) times daily.   Yes [provider]  aspirin 81 MG chewable  tablet Chew 81 mg by mouth daily.   Yes [provider]  atorvastatin (LIPITOR) 80 MG tablet Take 80 mg by mouth daily. 12/02/18  Yes [provider]  brimonidine (ALPHAGAN) 0.15 % ophthalmic solution Place 1 drop into both eyes 3 (three) times daily. 12/20/17  Yes [provider]  cholecalciferol (VITAMIN D) 1000 units tablet Take 1,000 Units by mouth daily.   Yes [provider]  ipratropium-albuterol (DUONEB) 0.5-2.5 (3) MG/3ML SOLN Take 3 mLs by nebulization 3 (three) times daily. 03/05/19  Yes Lang Snow, NP   latanoprost (XALATAN) 0.005 % ophthalmic solution Place 1 drop into both eyes at bedtime. 12/20/17  Yes [provider]  magnesium oxide (MAG-OX) 400 MG tablet Take 400 mg by mouth 2 (two) times daily.   Yes [provider]  metoprolol (TOPROL-XL) 200 MG 24 hr tablet Take 200 mg by mouth daily.   Yes [provider]  nitroGLYCERIN (NITROSTAT) 0.4 MG SL tablet Place 0.4 mg under the tongue every 5 (five) minutes as needed for chest pain.   Yes [provider]  tiotropium (SPIRIVA) 18 MCG inhalation capsule Place 18 mcg into inhaler and inhale daily.   Yes [provider]  torsemide (DEMADEX) 20 MG tablet Take 1 tablet (20 mg total) by mouth 2 (two) times daily. 03/05/19  Yes Lang Snow, NP  vitamin B-12 1000 MCG tablet Take 1 tablet (1,000 mcg total) by mouth daily. 03/05/19  Yes Lang Snow, NP    Review of Systems  Constitutional: Positive for fatigue. Negative for appetite change.  HENT: Negative for congestion, postnasal drip and sore throat.   Eyes: Negative.   Respiratory: Positive for cough and shortness of breath (with minimal exertion).   Cardiovascular: Positive for leg swelling (around ankles). Negative for chest pain and palpitations.  Gastrointestinal: Negative for abdominal distention and abdominal pain.  Endocrine: Negative.   Genitourinary: Negative.   Musculoskeletal: Negative for back pain and neck pain.  Skin: Negative.   Allergic/Immunologic: Negative.   Neurological: Positive for light-headedness (with position changes). Negative for dizziness.  Hematological: Negative for adenopathy. Does not bruise/bleed easily.  Psychiatric/Behavioral: Negative for dysphoric mood and sleep disturbance (wears oxygen at 4L). The patient is not nervous/anxious.    Vitals:   03/11/19 1327 03/11/19 1338  BP: (!) 130/39 136/68  Pulse: 64   Resp: 18   Temp: 98.7 F (37.1 C)   SpO2: 90%   Weight: 215 lb (97.5 kg)    Height: 5\' 11"  (1.803 m)    Wt Readings from Last 3 Encounters:  03/11/19 215 lb (97.5 kg)  03/05/19 212 lb 1.3 oz (96.2 kg)  02/05/19 212 lb (96.2 kg)   Lab Results  Component Value Date   CREATININE 1.45 (H) 03/05/2019   CREATININE 1.71 (H) 03/04/2019   CREATININE 1.76 (H) 03/03/2019     Physical Exam Vitals signs and nursing note reviewed.  Constitutional:      Appearance: He is well-developed.  HENT:     Head: Normocephalic and atraumatic.  Neck:     Musculoskeletal: Neck supple.     Vascular: No JVD.  Cardiovascular:     Rate and Rhythm: Normal rate and regular rhythm.  Pulmonary:     Effort: Pulmonary effort is normal. No respiratory distress.     Breath sounds: Examination of the right-lower field reveals rhonchi. Examination of the left-lower field reveals rhonchi. Rhonchi present. No wheezing or rales.  Abdominal:     Palpations: Abdomen is  soft.     Tenderness: There is no abdominal tenderness.  Musculoskeletal:     Right lower leg: He exhibits no tenderness. Edema (1+ pitting) present.     Left lower leg: He exhibits no tenderness. Edema (1+ pitting) present.  Skin:    General: Skin is warm and dry.  Neurological:     General: No focal deficit present.     Mental Status: He is alert and oriented to person, place, and time.  Psychiatric:        Mood and Affect: Mood normal.        Behavior: Behavior normal.    Assessment & Plan:  1: Chronic heart failure with preserved ejection fraction- - NYHA class III - euvolemic today - weighing daily; instructed to call for an overnight weight gain of >2 pounds or a weekly weight gain of >5 pounds - weight unchanged from last visit here 6 months ago - not adding salt to his food; reviewed the importance of closely following a 2000mg  sodium diet  - follows with cardiology Marlou Sa) - BNP 02/25/2019 was 399.0 - participating in paramedicine program - has [facility]  2: HTN-  - BP looked good upon recheck  (136 68) - follows with PCP at [place] 03/05/2019 reviewed and showed sodium 143, potassium 3.7, creatinine 1.45 and GFR 55  3: COPD- - wearing oxygen at 4L around the clock - O2 sat was low when patient was wearing his mask and quickly improved once he removed his mask  4: Lymphedema- - stage 2 - not wearing compression socks and he was encouraged to put them on first thing in the morning with removal at bedtime - elevating his legs when sitting for long periods of time - limited in his ability to exercise due to his COPD - consider lymphapress compression boots if edema persists  Medication list was reviewed.  Return in 3 months or sooner for any questions/problems before then.

## 2019-03-11 NOTE — Patient Instructions (Addendum)
Continue weighing daily and call for an overnight weight gain of > 2 pounds or a weekly weight gain of >5 pounds.   Get support socks and put them on each morning and remove them at bedtime. Continue to elevate your legs when sitting for long periods of time.

## 2019-03-12 ENCOUNTER — Encounter: Payer: Self-pay | Admitting: Family

## 2019-03-18 ENCOUNTER — Telehealth (HOSPITAL_COMMUNITY): Payer: Self-pay

## 2019-03-18 NOTE — Telephone Encounter (Signed)
Had a telephone visit with Darius Taylor today.  He states breathing is doing good.  He still wears his oxygen 24 hours a day.  He states taking his medications, verified them.  His appetite been good, still receiving Moms meals and eating them.  His daughter also brings him food and watches after him.  He has home health coming in.  He states getting around pretty good.  He denies shortness of breath at rest, chest pain, headaches or dizziness.  He states swelling is good and urinating frequently.  Will continue to visit for heart failure.   [place] [phone]

## 2019-03-23 ENCOUNTER — Ambulatory Visit: Payer: Medicare HMO | Admitting: Family

## 2019-03-26 DIAGNOSIS — Z952 Presence of prosthetic heart valve: Secondary | ICD-10-CM | POA: Insufficient documentation

## 2019-04-08 ENCOUNTER — Telehealth (HOSPITAL_COMMUNITY): Payer: Self-pay

## 2019-04-08 NOTE — Telephone Encounter (Signed)
Attempted to contact for an appt, no answer and unable to leave message.  Will continue to try to contact him.   [place] [phone]

## 2019-04-21 ENCOUNTER — Telehealth (HOSPITAL_COMMUNITY): Payer: Self-pay

## 2019-04-21 NOTE — Telephone Encounter (Signed)
Contacted Raydin to check on him since I have been unable to get a hold of him.  Finally got him on his cell phone.  He is currently at [facility], he states been there over a week.  He states had some fluid and they doubled his lasix but was no better and they advised him to go there.  Will continue to check on him and will visit once out of hospital.  He states has no time frame of when he will be discharge and that he was feeling better.   [place] [phone]

## 2019-05-06 ENCOUNTER — Telehealth (HOSPITAL_COMMUNITY): Payer: Self-pay

## 2019-05-13 ENCOUNTER — Encounter (HOSPITAL_COMMUNITY): Payer: Self-pay

## 2019-05-13 ENCOUNTER — Other Ambulatory Visit (HOSPITAL_COMMUNITY): Payer: Self-pay

## 2019-05-13 NOTE — Progress Notes (Signed)
Today was a visit with Darius Taylor at home.  He had been in the hospital at [facility] a couple of times past 2 weeks.  He has some med changes at discharge and he aware of how to take his medications.  Discussed med changes with HF clinic.  Made changes in med list.  He states feeling better.  Breathing is better.  Lungs were clear.  Some edema in extremities.  Discussed when to take second dose of lasix, he has been taking at night and having to get up during the night to urinate.  Discussed diet and fluid intake.  He has been drinking approx 80 ounces or more, discussed to cut back to 60 ounces.  He has home health coming in, [facility].   His daughter lives next door and helps him with transportations to doctors and meals.  He appears to be doing well today.  Denies chest pain, shortness of breath, headaches or dizziness.  Abdomen is soft and not tight feeling.  Will continue to visit for heart failure, diet nad medication compliance.   [place] [phone]

## 2019-05-26 NOTE — Telephone Encounter (Signed)
Today was a telephone appt with [place] to check on him since hospital stay and to set up appt to visit at home to go over medications.  He states feeling much better, he has several appts he states coming up.  He states has all his medications and he takes them out of the bottle.  His daughter helps him out, she lives next door.  He states eating good.  His breathing is much better he states, still on oxygen.  He denies chest pain today.  He states has little swelling but around normal.  He states sleeping ok, gets up and have to urinate during the night.  He has home health starting back.  Will visit next week at home.   [place] [phone]

## 2019-06-09 NOTE — Progress Notes (Deleted)
Patient ID: Darius Taylor, male    DOB: 1945-10-01, 73 y.o.   MRN: [id number]  HPI  Darius Taylor is a 73 y/o male with a history of CAD, COPD, HTN, asthma, colon/ renal cancer, atrial fibrillation, previous tobacco use and chronic heart failure.   Echo report from 11/04/2018 reviewed and showed an EF of 50-55% along with moderate TR and mild AR. Echo report from 03/29/17 reviewed and showed an EF of 55-60% along with an elevated PA pressure of 71 mmHg.  Admitted 02/23/2019 due to hypoxia secondary to HF/ COPD exacerbation. Palliative care, nephrology, pulmonology and cardiology consults obtained. Diuresed, given nebulizer & supplemental oxygen. Received 1 unit of PRBC's due to anemia. Discharged after 10 days.   He presents today for a follow-up visit with a chief complaint of     Past Medical History:  Diagnosis Date  . Aortic stenosis   . Arrhythmia    atrial fibrillation  . Asthma   . Atrial fibrillation ([place])   . CAD (coronary artery disease)   . Cancer [facility])    colon, bladder and kidney  . CHF (congestive heart failure) ([place])   . 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 COPD (chronic obstructive pulmonary disease) ([place])   . H/O ventricular tachycardia   . Hypertension   . Nonischemic cardiomyopathy ([place])    Status post AICD and pacemaker  . Renal cell carcinoma [facility])    Past Surgical History:  Procedure Laterality Date  . CARDIAC DEFIBRILLATOR PLACEMENT    . CARDIAC DEFIBRILLATOR PLACEMENT    . COLONOSCOPY WITH PROPOFOL N/A 06/10/2018   Procedure: COLONOSCOPY WITH PROPOFOL;  Surgeon: Lin Landsman, MD;  Location: [facility] ENDOSCOPY;  Service: Gastroenterology;  Laterality: N/A;  . CORONARY ANGIOPLASTY  09/25/2016  . IRRIGATION AND DEBRIDEMENT HEMATOMA Left 10/24/2016   Procedure: IRRIGATION AND DEBRIDEMENT HEMATOMA;  Surgeon: Florene Glen, MD;  Location: [facility] ORS;  Service: General;  Laterality: Left;  . KIDNEY SURGERY     left ne[hrectomy for RCC  . PACEMAKER IMPLANT    .  PARTIAL COLECTOMY     Family History  Problem Relation Age of Onset  . Hypertension Mother   . Stroke Father    Social History   Tobacco Use  . Smoking status: Former Research scientist (life sciences)  . Smokeless tobacco: Never Used  Substance Use Topics  . Alcohol use: No   No Known Allergies    Review of Systems  Constitutional: Positive for fatigue. Negative for appetite change.  HENT: Negative for congestion, postnasal drip and sore throat.   Eyes: Negative.   Respiratory: Positive for cough and shortness of breath (with minimal exertion).   Cardiovascular: Positive for leg swelling (around ankles). Negative for chest pain and palpitations.  Gastrointestinal: Negative for abdominal distention and abdominal pain.  Endocrine: Negative.   Genitourinary: Negative.   Musculoskeletal: Negative for back pain and neck pain.  Skin: Negative.   Allergic/Immunologic: Negative.   Neurological: Positive for light-headedness (with position changes). Negative for dizziness.  Hematological: Negative for adenopathy. Does not bruise/bleed easily.  Psychiatric/Behavioral: Negative for dysphoric mood and sleep disturbance (wears oxygen at 4L). The patient is not nervous/anxious.       Physical Exam Vitals signs and nursing note reviewed.  Constitutional:      Appearance: He is well-developed.  HENT:     Head: Normocephalic and atraumatic.  Neck:     Musculoskeletal: Neck supple.     Vascular: No JVD.  Cardiovascular:     Rate and Rhythm:  Normal rate and regular rhythm.  Pulmonary:     Effort: Pulmonary effort is normal. No respiratory distress.     Breath sounds: Examination of the right-lower field reveals rhonchi. Examination of the left-lower field reveals rhonchi. Rhonchi present. No wheezing or rales.  Abdominal:     Palpations: Abdomen is soft.     Tenderness: There is no abdominal tenderness.  Musculoskeletal:     Right lower leg: He exhibits no tenderness. Edema (1+ pitting) present.     Left  lower leg: He exhibits no tenderness. Edema (1+ pitting) present.  Skin:    General: Skin is warm and dry.  Neurological:     General: No focal deficit present.     Mental Status: He is alert and oriented to person, place, and time.  Psychiatric:        Mood and Affect: Mood normal.        Behavior: Behavior normal.    Assessment & Plan:  1: Chronic heart failure with preserved ejection fraction- - NYHA class III - euvolemic today - weighing daily; instructed to call for an overnight weight gain of >2 pounds or a weekly weight gain of >5 pounds - weight 215 from last visit here 3 months ago - not adding salt to his food; reviewed the importance of closely following a 2000mg  sodium diet  - follows with cardiology Marlou Sa) - BNP 02/25/2019 was 399.0 - participating in paramedicine program - has [facility] home [facility]  2: HTN-  - BP  - follows with PCP at [facility] 03/05/2019 reviewed and showed sodium 143, potassium 3.7, creatinine 1.45 and GFR 55  3: COPD- - wearing oxygen at 4L around the clock - O2 sat was low when patient was wearing his mask and quickly improved once he removed his mask  4: Lymphedema- - stage 2 - not wearing compression socks and he was encouraged to put them on first thing in the morning with removal at bedtime - elevating his legs when sitting for long periods of time - limited in his ability to exercise due to his COPD - consider lymphapress compression boots if edema persists  Medication list was reviewed.

## 2019-06-10 ENCOUNTER — Ambulatory Visit: Payer: Medicare HMO | Admitting: Family

## 2019-06-11 ENCOUNTER — Other Ambulatory Visit (HOSPITAL_COMMUNITY): Payer: Self-pay

## 2019-06-11 NOTE — Progress Notes (Signed)
Today had a visit with Darius Taylor.  He states been doing ok.  Denies shortness of breath, chest pain, headaches or dizziness.  He states eating well and watching his fluids.  He has all his medications and is aware of how to take them.  Meds verified.  His daughter lives next door and cooks for him and helps with appts.  He states he is able to get up and around pretty good.  Will continue to visit for heart failure.   [place] [phone]

## 2019-07-17 ENCOUNTER — Telehealth: Payer: Self-pay | Admitting: Family

## 2019-07-17 NOTE — Telephone Encounter (Signed)
Received voicemail message from a [facility] provider regarding scheduling an appointment with patient at the HF Clinic. Called back to the # that was left and was transferred numerous times and then the phone rang continuously.   Spoke with patient who said he wasn't sure when he was going home and told me to call her daughter to discuss the appointment.   Called his daughter, Arizona Constable, who also says that she isn't sure when he's going home. Advised that I could see him this Monday (today is Friday) but she didn't want to take that appointment since she wasn't sure if he would be home by then. She asked if he could be seen on 07/29/2019 (~10 days from now) as she would be off on that day. Appointment was scheduled for 07/29/2019 at 8:30am.

## 2019-07-22 ENCOUNTER — Other Ambulatory Visit (HOSPITAL_COMMUNITY): Payer: Self-pay

## 2019-07-22 ENCOUNTER — Encounter (HOSPITAL_COMMUNITY): Payer: Self-pay

## 2019-07-22 NOTE — Progress Notes (Signed)
Today did a telephone visit with Darius Taylor.  He states just got out of hospital.  He has been going to [facility] past few months and I can not keep up with him.  Meds were verifed.  He states he had fluid built up, he states if he gets above 210 he needs to call his doctor.  He weighed 200 lbs this am.  He states watching his diet and fluids.  He states breathing is good today, still on oxygen.  He states fluid is down today.  He stays by his self, his daughter lives next door.  He has home health coming in and a PA service that comes.  His appetite is good.  He states sleeping good at night.  He denies any chest pain, shortness of breath at rest, headaches or dizziness today.  Will continue to visit for heart failure.  He is aware of appt with HF clinic next week.   [place] [phone]

## 2019-07-29 ENCOUNTER — Ambulatory Visit: Payer: Medicare HMO | Admitting: Family

## 2019-07-29 ENCOUNTER — Telehealth (HOSPITAL_COMMUNITY): Payer: Self-pay

## 2019-07-29 ENCOUNTER — Telehealth: Payer: Self-pay | Admitting: Family

## 2019-07-29 NOTE — Telephone Encounter (Signed)
Spoke with Darius Taylor and his daughter about missing HF clinic appt today.  He states was worn out from PCP appt yesterday and physical therapy.  He states just could not go this morning.  After some rest he states feeling better.  Spoke with his daughter and gave her new appt and she states she will bring him.  They are aware of coming through [facility] entrance.    [place] [phone]

## 2019-07-29 NOTE — Telephone Encounter (Signed)
Patient did not show for his Heart Failure Clinic appointment on 10/07/2018. Will attempt to reschedule.  

## 2019-08-06 NOTE — Progress Notes (Signed)
Patient ID: Darius Taylor, male    DOB: [birth year], 73 y.o.   MRN: [id number]  HPI  Darius Taylor is a 73 y/o male with a history of CAD, COPD, HTN, asthma, colon/ renal cancer, atrial fibrillation, previous tobacco use and chronic heart failure.   Echo report from 11/04/2018 reviewed and showed an EF of 50-55% along with moderate TR and mild AR. Echo report from 03/29/17 reviewed and showed an EF of 55-60% along with an elevated PA pressure of 71 mmHg.  Was in the ED 08/01/2019 due to acute gout where he was treated and released. Was in the ED 07/29/2019 due to acute kidney injury where he was treated and release. Admitted 07/14/2019 due to acute HF exacerbation. Initially needed IV lasix and metolazone and then transitioned to oral diuretics. Given 1 unit of PRBC's and iron infusion. Discharged after 6 days. Was in the ED 05/07/2019 due to acute kidney injury. Home diuretics were held, IV fluids were given and he was released.                              He presents today for a follow-up visit with a chief complaint of moderate shortness of breath upon minimal exertion. He describes this as chronic in nature having been present for several years. He has associated fatigue and easy bruising along with this. He denies any dizziness, abdominal distention, palpitations, pedal edema, chest pain, cough or weight gain.  Has been in the ED/ admitted numerous times over the last few months.   Past Medical History:  Diagnosis Date  . Aortic stenosis   . Arrhythmia    atrial fibrillation  . Asthma   . Atrial fibrillation ([place])   . CAD (coronary artery disease)   . Cancer [facility])    colon, bladder and kidney  . CHF (congestive heart failure) ([place])   . 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 COPD (chronic obstructive pulmonary disease) ([place])   . H/O ventricular tachycardia   . Hypertension   . Nonischemic cardiomyopathy ([place])    Status post AICD and pacemaker  . Renal cell carcinoma [facility])    Past Surgical History:   Procedure Laterality Date  . CARDIAC DEFIBRILLATOR PLACEMENT    . CARDIAC DEFIBRILLATOR PLACEMENT    . COLONOSCOPY WITH PROPOFOL N/A 06/10/2018   Procedure: COLONOSCOPY WITH PROPOFOL;  Surgeon: Lin Landsman, MD;  Location: [facility] ENDOSCOPY;  Service: Gastroenterology;  Laterality: N/A;  . CORONARY ANGIOPLASTY  09/25/2016  . IRRIGATION AND DEBRIDEMENT HEMATOMA Left 10/24/2016   Procedure: IRRIGATION AND DEBRIDEMENT HEMATOMA;  Surgeon: Florene Glen, MD;  Location: [facility] ORS;  Service: General;  Laterality: Left;  . KIDNEY SURGERY     left ne[hrectomy for RCC  . PACEMAKER IMPLANT    . PARTIAL COLECTOMY     Family History  Problem Relation Age of Onset  . Hypertension Mother   . Stroke Father    Social History   Tobacco Use  . Smoking status: Former Research scientist (life sciences)  . Smokeless tobacco: Never Used  Substance Use Topics  . Alcohol use: No   No Known Allergies  Prior to Admission medications   Medication Sig Start Date End Date Taking? Authorizing Provider  acetaminophen (TYLENOL) 325 MG tablet Take 650 mg by mouth every 6 (six) hours as needed for mild pain or fever.    Yes [provider]  amiodarone (PACERONE) 200 MG tablet Take 200 mg by mouth daily.  Yes [provider]  apixaban (ELIQUIS) 5 MG TABS tablet Take 5 mg by mouth 2 (two) times daily.   Yes [provider]  aspirin 81 MG chewable tablet Chew 81 mg by mouth daily.   Yes [provider]  atorvastatin (LIPITOR) 80 MG tablet Take 80 mg by mouth daily. 12/02/18  Yes [provider]  cholecalciferol (VITAMIN D) 1000 units tablet Take 1,000 Units by mouth daily.   Yes [provider]  colchicine (COLCRYS) 0.6 MG tablet Take 0.6 mg by mouth daily.   Yes [provider]  ferrous gluconate (FERGON) 324 MG tablet Take 324 mg by mouth daily with breakfast.   Yes [provider]  furosemide (LASIX) 80 MG tablet Take 80 mg by mouth 2 (two) times daily.   Yes  [provider]  ipratropium-albuterol (DUONEB) 0.5-2.5 (3) MG/3ML SOLN Take 3 mLs by nebulization 3 (three) times daily. 03/05/19  Yes Lang Snow, NP  magnesium oxide (MAG-OX) 400 MG tablet Take 400 mg by mouth 1 day or 1 dose.    Yes [provider]  metoprolol (TOPROL-XL) 200 MG 24 hr tablet Take 200 mg by mouth daily.   Yes [provider]  pantoprazole (PROTONIX) 40 MG tablet Take 40 mg by mouth daily.   Yes [provider]  spironolactone (ALDACTONE) 25 MG tablet Take 25 mg by mouth daily.   Yes [provider]  tiotropium (SPIRIVA) 18 MCG inhalation capsule Place 18 mcg into inhaler and inhale daily.   Yes [provider]  vitamin B-12 1000 MCG tablet Take 1 tablet (1,000 mcg total) by mouth daily. 03/05/19  Yes Lang Snow, NP     Review of Systems  Constitutional: Positive for fatigue. Negative for appetite change.  HENT: Negative for congestion, postnasal drip and sore throat.   Eyes: Negative.   Respiratory: Positive for shortness of breath (with minimal exertion). Negative for cough.   Cardiovascular: Negative for chest pain, palpitations and leg swelling.  Gastrointestinal: Negative for abdominal distention and abdominal pain.  Endocrine: Negative.   Genitourinary: Negative.   Musculoskeletal: Negative for back pain and neck pain.  Skin: Negative.   Allergic/Immunologic: Negative.   Neurological: Negative for dizziness and light-headedness.  Hematological: Negative for adenopathy. Bruises/bleeds easily.  Psychiatric/Behavioral: Negative for dysphoric mood and sleep disturbance (wears oxygen at 3L). The patient is not nervous/anxious.    Vitals:   08/07/19 1205  BP: (!) 136/52  Pulse: 60  Resp: 16  SpO2: 95%  Weight: 202 lb (91.6 kg)  Height: 5\' 11"  (1.803 m)   Wt Readings from Last 3 Encounters:  08/07/19 202 lb (91.6 kg)  07/22/19 200 lb (90.7 kg)  06/11/19 207 lb (93.9 kg)   Lab  Results  Component Value Date   CREATININE 1.45 (H) 03/05/2019   CREATININE 1.71 (H) 03/04/2019   CREATININE 1.76 (H) 03/03/2019     Physical Exam Vitals signs and nursing note reviewed.  Constitutional:      Appearance: He is well-developed.  HENT:     Head: Normocephalic and atraumatic.  Neck:     Musculoskeletal: Neck supple.     Vascular: No JVD.  Cardiovascular:     Rate and Rhythm: Normal rate and regular rhythm.  Pulmonary:     Effort: Pulmonary effort is normal. No respiratory distress.     Breath sounds: No wheezing, rhonchi or rales.  Abdominal:     Palpations: Abdomen is soft.     Tenderness: There is  no abdominal tenderness.  Musculoskeletal:     Right lower leg: He exhibits no tenderness. No edema.     Left lower leg: He exhibits no tenderness. No edema.  Skin:    General: Skin is warm and dry.  Neurological:     General: No focal deficit present.     Mental Status: He is alert and oriented to person, place, and time.  Psychiatric:        Mood and Affect: Mood normal.        Behavior: Behavior normal.    Assessment & Plan:  1: Chronic heart failure with preserved ejection fraction- - NYHA class III - euvolemic today - weighing daily; instructed to call for an overnight weight gain of >2 pounds or a weekly weight gain of >5 pounds - weight down 13 pounds from last visit here 5 months ago - not adding salt to his food; reviewed the importance of closely following a 2000mg  sodium diet  - follows with cardiology Marlou Sa) - BNP 02/25/2019 was 399.0 - participating in paramedicine program - has [facility] home health and PT at the moment  2: HTN-  - BP looks good today - follows with PCP at [facility] saw them a couple of weeks ago - BMP 03/05/2019 reviewed and showed sodium 143, potassium 3.7, creatinine 1.45 and GFR 55  3: COPD- - wearing oxygen at 3L at home and at bedtime  4: Lymphedema- - resolved at this time  Medication bottles were reviewed.    Return in 2 months or sooner for any questions/problems before then.

## 2019-08-07 ENCOUNTER — Ambulatory Visit: Payer: Medicare HMO | Attending: Family | Admitting: Family

## 2019-08-07 ENCOUNTER — Other Ambulatory Visit: Payer: Self-pay

## 2019-08-07 ENCOUNTER — Encounter: Payer: Self-pay | Admitting: Family

## 2019-08-07 VITALS — BP 136/52 | HR 60 | Resp 16 | Ht 71.0 in | Wt 202.0 lb

## 2019-08-07 DIAGNOSIS — I2510 Atherosclerotic heart disease of native coronary artery without angina pectoris: Secondary | ICD-10-CM | POA: Diagnosis not present

## 2019-08-07 DIAGNOSIS — J449 Chronic obstructive pulmonary disease, unspecified: Secondary | ICD-10-CM

## 2019-08-07 DIAGNOSIS — I890 Lymphedema, not elsewhere classified: Secondary | ICD-10-CM | POA: Diagnosis not present

## 2019-08-07 DIAGNOSIS — Z950 Presence of cardiac pacemaker: Secondary | ICD-10-CM | POA: Insufficient documentation

## 2019-08-07 DIAGNOSIS — Z79899 Other long term (current) drug therapy: Secondary | ICD-10-CM | POA: Diagnosis not present

## 2019-08-07 DIAGNOSIS — Z7982 Long term (current) use of aspirin: Secondary | ICD-10-CM | POA: Insufficient documentation

## 2019-08-07 DIAGNOSIS — Z7901 Long term (current) use of anticoagulants: Secondary | ICD-10-CM | POA: Insufficient documentation

## 2019-08-07 DIAGNOSIS — I350 Nonrheumatic aortic (valve) stenosis: Secondary | ICD-10-CM | POA: Insufficient documentation

## 2019-08-07 DIAGNOSIS — Z8249 Family history of ischemic heart disease and other diseases of the circulatory system: Secondary | ICD-10-CM | POA: Diagnosis not present

## 2019-08-07 DIAGNOSIS — R5383 Other fatigue: Secondary | ICD-10-CM | POA: Insufficient documentation

## 2019-08-07 DIAGNOSIS — R0602 Shortness of breath: Secondary | ICD-10-CM | POA: Diagnosis present

## 2019-08-07 DIAGNOSIS — Z87891 Personal history of nicotine dependence: Secondary | ICD-10-CM | POA: Insufficient documentation

## 2019-08-07 DIAGNOSIS — I5032 Chronic diastolic (congestive) heart failure: Secondary | ICD-10-CM | POA: Diagnosis not present

## 2019-08-07 DIAGNOSIS — I110 Hypertensive heart disease with heart failure: Secondary | ICD-10-CM | POA: Diagnosis not present

## 2019-08-07 DIAGNOSIS — M109 Gout, unspecified: Secondary | ICD-10-CM | POA: Insufficient documentation

## 2019-08-07 DIAGNOSIS — I10 Essential (primary) hypertension: Secondary | ICD-10-CM

## 2019-08-07 NOTE — Patient Instructions (Signed)
Continue weighing daily and call for an overnight weight gain of > 2 pounds or a weekly weight gain of >5 pounds. 

## 2019-09-07 ENCOUNTER — Telehealth (HOSPITAL_COMMUNITY): Payer: Self-pay

## 2019-09-07 NOTE — Telephone Encounter (Signed)
Today had a telephone appt with Darius Taylor.  He states been doing well.  Breathing is much better.  He keeps his weight under 200 lbs, today was 196 lbs.  He has all his medications and he is aware of how to take it.  He states appetite is good, his daughter cooks for him in evening.  His daughter keeps up with his appts.  Verified his medications.  He still on oxygen and wears it at all times.  Swelling is down.  He denies having any chest pain, shortness of breath at rest, headaches or dizziness.  He states has everything he needs.  Will continue to visit for heart failure.   [place] [phone]

## 2019-10-14 NOTE — Progress Notes (Signed)
Patient ID: NECO KLING, male    DOB: [birth year], 74 y.o.   MRN: [id number]  HPI  Mr Troop is a 74 y/o male with a history of CAD, COPD, HTN, asthma, colon/ renal cancer, atrial fibrillation, previous tobacco use and chronic heart failure.   Echo report from 11/04/2018 reviewed and showed an EF of 50-55% along with moderate TR and mild AR. Echo report from 03/29/17 reviewed and showed an EF of 55-60% along with an elevated PA pressure of 71 mmHg.  Was in the ED 08/01/2019 due to acute gout where he was treated and released. Was in the ED 07/29/2019 due to acute kidney injury where he was treated and release. Admitted 07/14/2019 due to acute HF exacerbation. Initially needed IV lasix and metolazone and then transitioned to oral diuretics. Given 1 unit of PRBC's and iron infusion. Discharged after 6 days. Was in the ED 05/07/2019 due to acute kidney injury. Home diuretics were held, IV fluids were given and he was released.                               He presents today for a follow-up visit with a chief complaint of minimal shortness of breath upon moderate exertion. He describes this as chronic in nature having been present for several years. He has associated fatigue and slight weight gain along with this. He denies any difficulty sleeping, abdominal distention, palpitations, pedal edema, chest pain, dizziness or cough. Says that his weight at home runs ~ 199 pounds.   Past Medical History:  Diagnosis Date  . Aortic stenosis   . Arrhythmia    atrial fibrillation  . Asthma   . Atrial fibrillation ([place])   . CAD (coronary artery disease)   . Cancer [facility])    colon, bladder and kidney  . CHF (congestive heart failure) ([place])   . 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 COPD (chronic obstructive pulmonary disease) ([place])   . H/O ventricular tachycardia   . Hypertension   . Nonischemic cardiomyopathy ([place])    Status post AICD and pacemaker  . Renal cell carcinoma [facility])    Past Surgical History:   Procedure Laterality Date  . CARDIAC DEFIBRILLATOR PLACEMENT    . CARDIAC DEFIBRILLATOR PLACEMENT    . COLONOSCOPY WITH PROPOFOL N/A 06/10/2018   Procedure: COLONOSCOPY WITH PROPOFOL;  Surgeon: Lin Landsman, MD;  Location: [facility] ENDOSCOPY;  Service: Gastroenterology;  Laterality: N/A;  . CORONARY ANGIOPLASTY  09/25/2016  . IRRIGATION AND DEBRIDEMENT HEMATOMA Left 10/24/2016   Procedure: IRRIGATION AND DEBRIDEMENT HEMATOMA;  Surgeon: Florene Glen, MD;  Location: [facility] ORS;  Service: General;  Laterality: Left;  . KIDNEY SURGERY     left ne[hrectomy for RCC  . PACEMAKER IMPLANT    . PARTIAL COLECTOMY     Family History  Problem Relation Age of Onset  . Hypertension Mother   . Stroke Father    Social History   Tobacco Use  . Smoking status: Former Research scientist (life sciences)  . Smokeless tobacco: Never Used  Substance Use Topics  . Alcohol use: No   No Known Allergies  Prior to Admission medications   Medication Sig Start Date End Date Taking? Authorizing Provider  acetaminophen (TYLENOL) 325 MG tablet Take 650 mg by mouth every 6 (six) hours as needed for mild pain or fever.    Yes [provider]  amiodarone (PACERONE) 200 MG tablet Take 200 mg by mouth daily.  Yes [provider]  apixaban (ELIQUIS) 5 MG TABS tablet Take 5 mg by mouth 2 (two) times daily.   Yes [provider]  aspirin 81 MG chewable tablet Chew 81 mg by mouth daily.   Yes [provider]  atorvastatin (LIPITOR) 80 MG tablet Take 80 mg by mouth daily. 12/02/18  Yes [provider]  cholecalciferol (VITAMIN D) 1000 units tablet Take 1,000 Units by mouth daily.   Yes [provider]  ferrous gluconate (FERGON) 324 MG tablet Take 324 mg by mouth daily with breakfast.   Yes [provider]  furosemide (LASIX) 80 MG tablet Take 80 mg by mouth 2 (two) times daily.   Yes [provider]  ipratropium-albuterol (DUONEB) 0.5-2.5 (3) MG/3ML SOLN Take 3 mLs by  nebulization 3 (three) times daily. 03/05/19  Yes Lang Snow, NP  magnesium oxide (MAG-OX) 400 MG tablet Take 400 mg by mouth 1 day or 1 dose.    Yes [provider]  metoprolol (TOPROL-XL) 200 MG 24 hr tablet Take 200 mg by mouth daily.   Yes [provider]  pantoprazole (PROTONIX) 40 MG tablet Take 40 mg by mouth daily.   Yes [provider]  spironolactone (ALDACTONE) 25 MG tablet Take 25 mg by mouth daily.   Yes [provider]  tiotropium (SPIRIVA) 18 MCG inhalation capsule Place 18 mcg into inhaler and inhale daily.   Yes [provider]  vitamin B-12 1000 MCG tablet Take 1 tablet (1,000 mcg total) by mouth daily. 03/05/19  Yes Lang Snow, NP     Review of Systems  Constitutional: Positive for fatigue. Negative for appetite change.  HENT: Negative for congestion, postnasal drip and sore throat.   Eyes: Negative.   Respiratory: Positive for shortness of breath (with moderate exertion). Negative for cough.   Cardiovascular: Negative for chest pain, palpitations and leg swelling.  Gastrointestinal: Negative for abdominal distention and abdominal pain.  Endocrine: Negative.   Genitourinary: Negative.   Musculoskeletal: Negative for back pain and neck pain.  Skin: Negative.   Allergic/Immunologic: Negative.   Neurological: Negative for dizziness and light-headedness.  Hematological: Negative for adenopathy. Bruises/bleeds easily.  Psychiatric/Behavioral: Negative for dysphoric mood and sleep disturbance. The patient is not nervous/anxious.    Vitals:   10/15/19 1220  BP: 132/77  Pulse: 73  Resp: 16  SpO2: 97%  Weight: 206 lb 9.6 oz (93.7 kg)  Height: 5\' 11"  (1.803 m)   Wt Readings from Last 3 Encounters:  10/15/19 206 lb 9.6 oz (93.7 kg)  08/07/19 202 lb (91.6 kg)  07/22/19 200 lb (90.7 kg)   Lab Results  Component Value Date   CREATININE 1.45 (H) 03/05/2019   CREATININE 1.71 (H) 03/04/2019   CREATININE  1.76 (H) 03/03/2019    Physical Exam Vitals and nursing note reviewed.  Constitutional:      Appearance: He is well-developed.  HENT:     Head: Normocephalic and atraumatic.  Neck:     Vascular: No JVD.  Cardiovascular:     Rate and Rhythm: Normal rate and regular rhythm.  Pulmonary:     Effort: Pulmonary effort is normal. No respiratory distress.     Breath sounds: No wheezing, rhonchi or rales.  Abdominal:     Palpations: Abdomen is soft.     Tenderness: There is no abdominal tenderness.  Musculoskeletal:     Cervical back: Neck supple.     Right lower leg: No tenderness. No edema.  Left lower leg: No tenderness. No edema.  Skin:    General: Skin is warm and dry.  Neurological:     General: No focal deficit present.     Mental Status: He is alert and oriented to person, place, and time.  Psychiatric:        Mood and Affect: Mood normal.        Behavior: Behavior normal.    Assessment & Plan:  1: Chronic heart failure with preserved ejection fraction- - NYHA class II - euvolemic today - weighing daily; instructed to call for an overnight weight gain of >2 pounds or a weekly weight gain of >5 pounds - weight up 4 pounds from last visit here 2 months ago - not adding salt to his food; reviewed the importance of closely following a 2000mg  sodium diet  - follows with cardiology Marlou Sa) - BNP 02/25/2019 was 399.0 - participating in paramedicine program - has [facility] home health and PT at the moment - has not received his flu vaccine yet  2: HTN-  - BP looks good today - follows with PCP at [place] 03/05/2019 reviewed and showed sodium 143, potassium 3.7, creatinine 1.45 and GFR 55  3: COPD- - wearing oxygen at 3L if needed  4: Lymphedema- - resolved at this time  Patient did not bring his medications nor a list. Each medication was verbally reviewed with the patient and he was encouraged to bring the bottles to every visit to confirm accuracy of list.    Return in 3 months or sooner for any questions/problems before then.

## 2019-10-15 ENCOUNTER — Other Ambulatory Visit: Payer: Self-pay

## 2019-10-15 ENCOUNTER — Encounter: Payer: Self-pay | Admitting: Family

## 2019-10-15 ENCOUNTER — Ambulatory Visit: Payer: Medicare HMO | Attending: Family | Admitting: Family

## 2019-10-15 VITALS — BP 132/77 | HR 73 | Resp 16 | Ht 71.0 in | Wt 206.6 lb

## 2019-10-15 DIAGNOSIS — I4891 Unspecified atrial fibrillation: Secondary | ICD-10-CM | POA: Insufficient documentation

## 2019-10-15 DIAGNOSIS — Z9049 Acquired absence of other specified parts of digestive tract: Secondary | ICD-10-CM | POA: Insufficient documentation

## 2019-10-15 DIAGNOSIS — Z95810 Presence of automatic (implantable) cardiac defibrillator: Secondary | ICD-10-CM | POA: Diagnosis not present

## 2019-10-15 DIAGNOSIS — I350 Nonrheumatic aortic (valve) stenosis: Secondary | ICD-10-CM | POA: Diagnosis not present

## 2019-10-15 DIAGNOSIS — I428 Other cardiomyopathies: Secondary | ICD-10-CM | POA: Diagnosis not present

## 2019-10-15 DIAGNOSIS — Z7982 Long term (current) use of aspirin: Secondary | ICD-10-CM | POA: Insufficient documentation

## 2019-10-15 DIAGNOSIS — Z87891 Personal history of nicotine dependence: Secondary | ICD-10-CM | POA: Diagnosis not present

## 2019-10-15 DIAGNOSIS — I110 Hypertensive heart disease with heart failure: Secondary | ICD-10-CM | POA: Insufficient documentation

## 2019-10-15 DIAGNOSIS — Z8249 Family history of ischemic heart disease and other diseases of the circulatory system: Secondary | ICD-10-CM | POA: Insufficient documentation

## 2019-10-15 DIAGNOSIS — Z823 Family history of stroke: Secondary | ICD-10-CM | POA: Insufficient documentation

## 2019-10-15 DIAGNOSIS — Z8551 Personal history of malignant neoplasm of bladder: Secondary | ICD-10-CM | POA: Diagnosis not present

## 2019-10-15 DIAGNOSIS — I890 Lymphedema, not elsewhere classified: Secondary | ICD-10-CM

## 2019-10-15 DIAGNOSIS — Z85038 Personal history of other malignant neoplasm of large intestine: Secondary | ICD-10-CM | POA: Insufficient documentation

## 2019-10-15 DIAGNOSIS — J449 Chronic obstructive pulmonary disease, unspecified: Secondary | ICD-10-CM | POA: Insufficient documentation

## 2019-10-15 DIAGNOSIS — Z7901 Long term (current) use of anticoagulants: Secondary | ICD-10-CM | POA: Insufficient documentation

## 2019-10-15 DIAGNOSIS — Z79899 Other long term (current) drug therapy: Secondary | ICD-10-CM | POA: Diagnosis not present

## 2019-10-15 DIAGNOSIS — I2510 Atherosclerotic heart disease of native coronary artery without angina pectoris: Secondary | ICD-10-CM | POA: Insufficient documentation

## 2019-10-15 DIAGNOSIS — Z85528 Personal history of other malignant neoplasm of kidney: Secondary | ICD-10-CM | POA: Insufficient documentation

## 2019-10-15 DIAGNOSIS — I5032 Chronic diastolic (congestive) heart failure: Secondary | ICD-10-CM | POA: Diagnosis not present

## 2019-10-15 DIAGNOSIS — I10 Essential (primary) hypertension: Secondary | ICD-10-CM

## 2019-10-15 NOTE — Patient Instructions (Signed)
Continue weighing daily and call for an overnight weight gain of > 2 pounds or a weekly weight gain of >5 pounds. 

## 2019-10-19 ENCOUNTER — Telehealth (HOSPITAL_COMMUNITY): Payer: Self-pay

## 2019-10-19 NOTE — Telephone Encounter (Signed)
Weight 202 lbs. Today had a telephone appt with Percell Miller.  He states feels good today.  He states appetite is good.  He is watching his fluids.  He weighs daily.  Breathing is good today.  He denies any swelling.  His weight is down a few pounds from last month.  He denies any chest pain, headaches or dizziness.  He has a daughter that cooks for him and handles his medications and appts.  Verified his medications, he is aware of how to take his medications and when.  He states has everything he needs for daily living.  Will set up a home visit in next couple of weeks.  Will visit for heart failure.   [place] [phone]

## 2019-11-20 ENCOUNTER — Telehealth (HOSPITAL_COMMUNITY): Payer: Self-pay

## 2019-11-20 NOTE — Telephone Encounter (Signed)
todays weight 203 lbs.  Today was a telephone visit.  He states doing well this past month.  He has had no hospitalizations this past month.  He has all his medications and aware of how to take them.  He has home health and a PA that visits with him.  He is getting most of his medical outside of [place].  He states weighs everyday and weight has been staying same.  He denies any problems, chest pain, headaches or dizziness.  He states swelling is down in extremities.  He denies a cough. His appetite is good, his daughter prepares meals for him and provides transportation to his appts.  He still on oxygen at home.  He denies shortness of breath at rest.  Will continue to visit for heart failure.    [place] [phone]

## 2019-11-27 IMAGING — DX PORTABLE CHEST - 1 VIEW
1 series · 1 of 1 positions shown · non-contrast
Comparison: 01/10/2019 and earlier.

CLINICAL DATA: 72-year-old male with shortness of breath. Negative
for [id number] on 01/03/2019.

EXAM:
PORTABLE CHEST 1 VIEW

[chest ap]
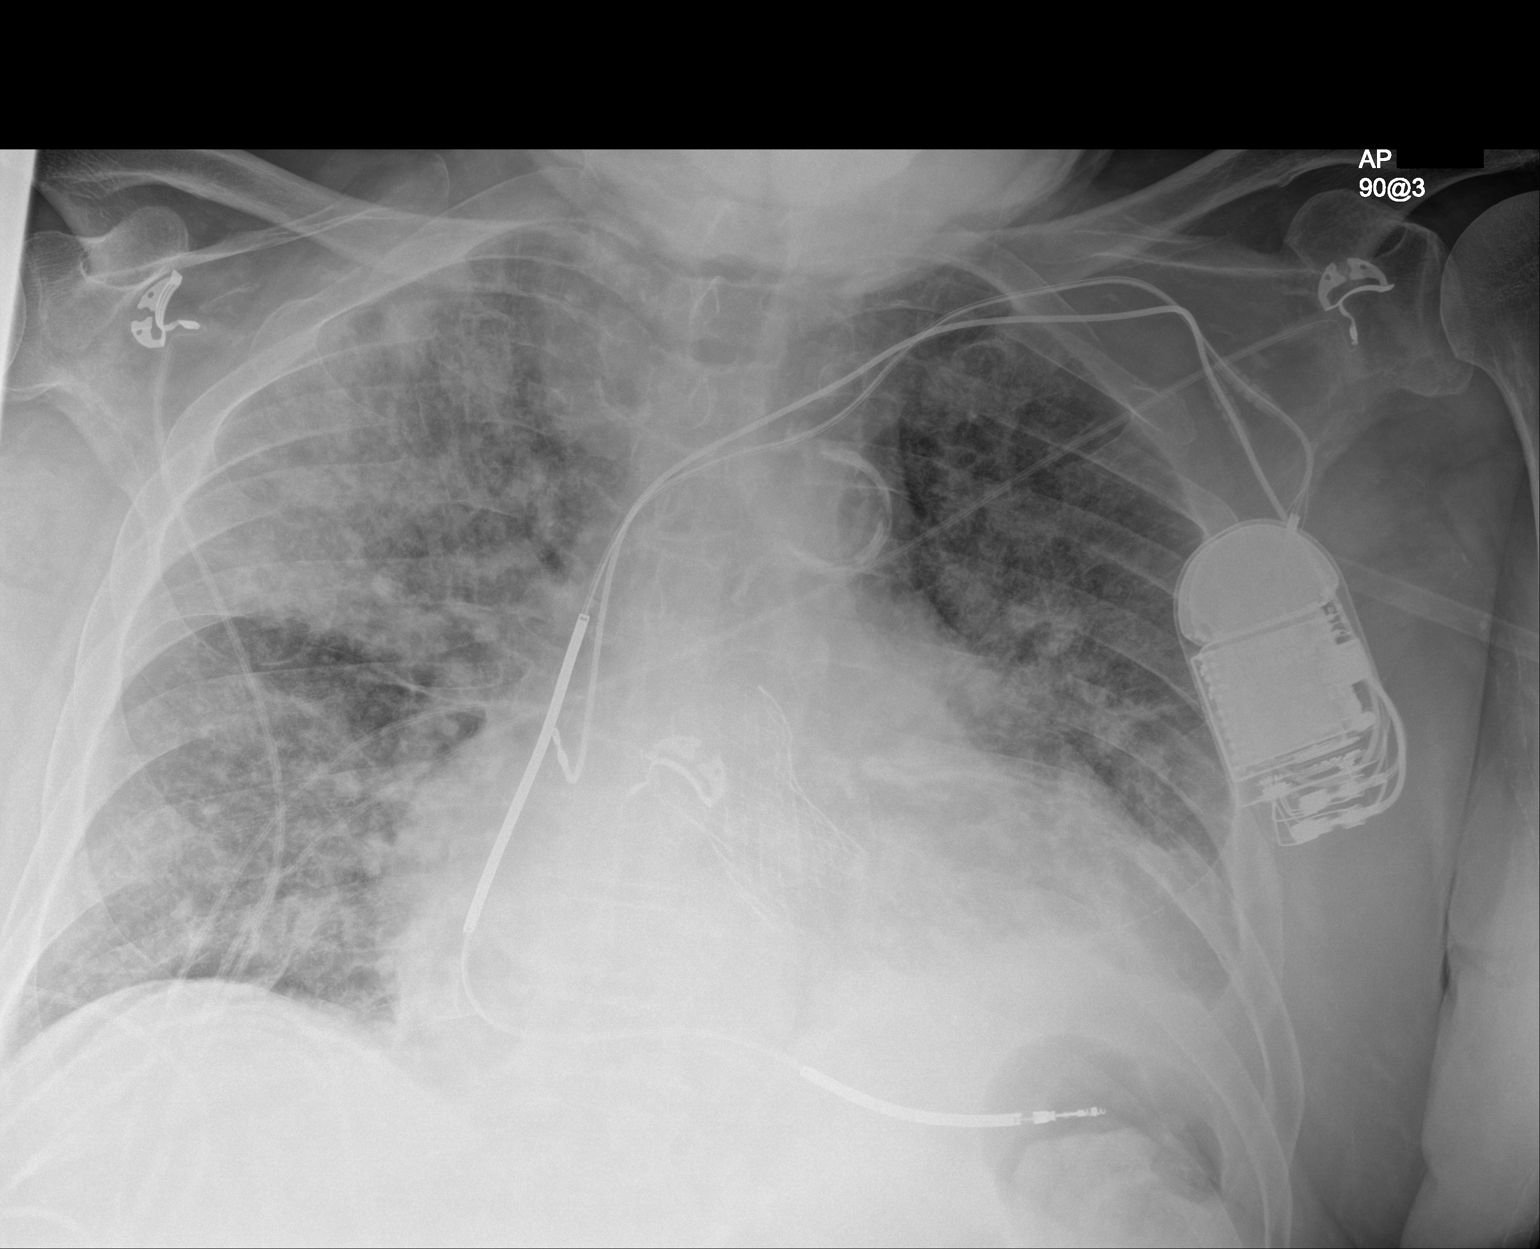

[1 of 1 positions shown; findings below may reference images not displayed]

FINDINGS: Portable AP upright view at 0941 hours. Stable cardiomegaly and
mediastinal contours. Prior TAVR. Calcified aortic atherosclerosis.
Stable left chest AICD.

Coarse bilateral pulmonary interstitial opacity is confluent in the
right upper lobe and at the left lung base. Ventilation has worsened
since 01/08/2019, and the right upper lobe opacity appears mildly
progressed since yesterday.

No superimposed pneumothorax. Small left pleural effusion is
difficult to exclude.
IMPRESSION: 1. Coarse bilateral pulmonary opacity with mild progression in the
right upper lobe since yesterday.
2. Stable cardiomegaly. Aortic Atherosclerosis ([id number].C).

## 2019-11-30 IMAGING — DX PORTABLE CHEST - 1 VIEW
1 series · 1 of 1 positions shown · non-contrast
Comparison: 01/15/2019.

CLINICAL DATA: Continued shortness of breath since admission.
Denies chest pain.

EXAM:
PORTABLE CHEST 1 VIEW

[chest ap]
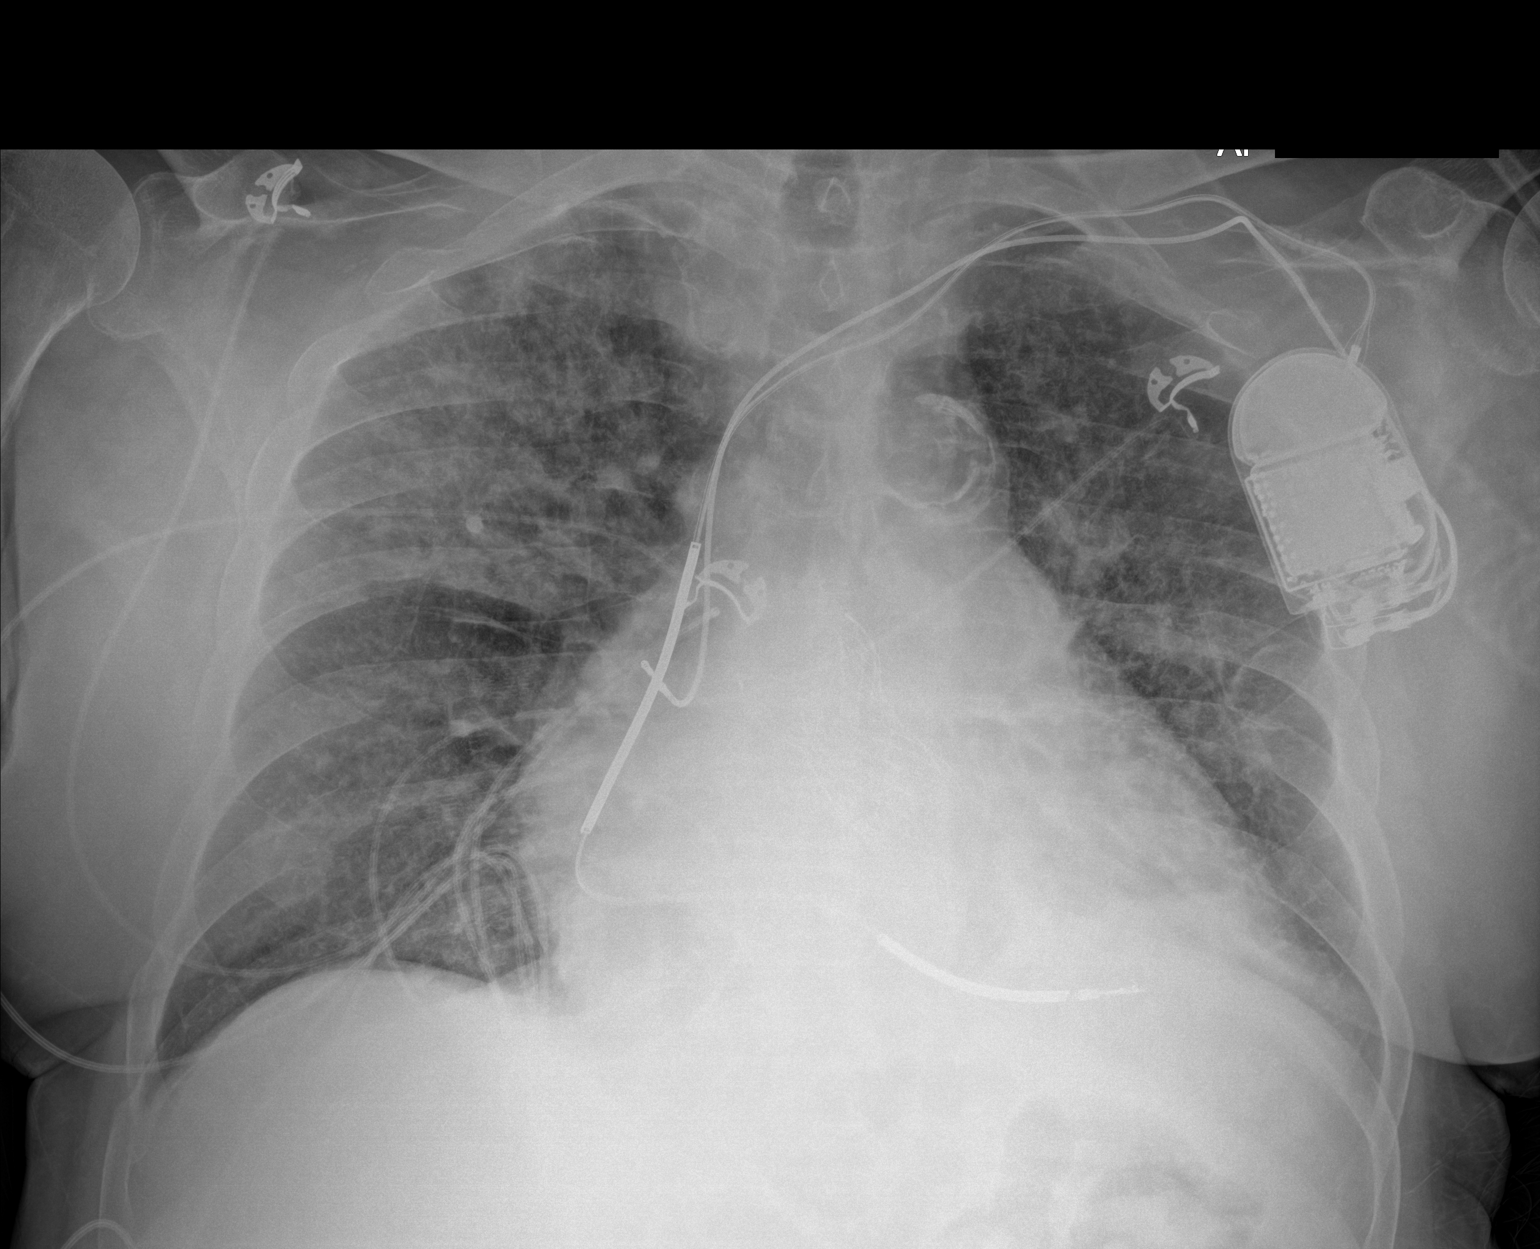

[1 of 1 positions shown; findings below may reference images not displayed]

FINDINGS: Massive cardiomegaly. Dual lead pacer/AICD, stable. BILATERAL
pulmonary opacities persist, but are slightly improved. These are
mildly consolidative in the RIGHT upper lobe. These opacities could
represent a combination of edema and infection. Continued
surveillance is warranted.
IMPRESSION: Slight improvement aeration.

## 2019-12-03 IMAGING — DX PORTABLE CHEST - 1 VIEW
1 series · 1 of 1 positions shown · non-contrast
Comparison: Radiograph January 18, 2019.

CLINICAL DATA: Shortness of breath.

EXAM:
PORTABLE CHEST 1 VIEW

[chest ap]
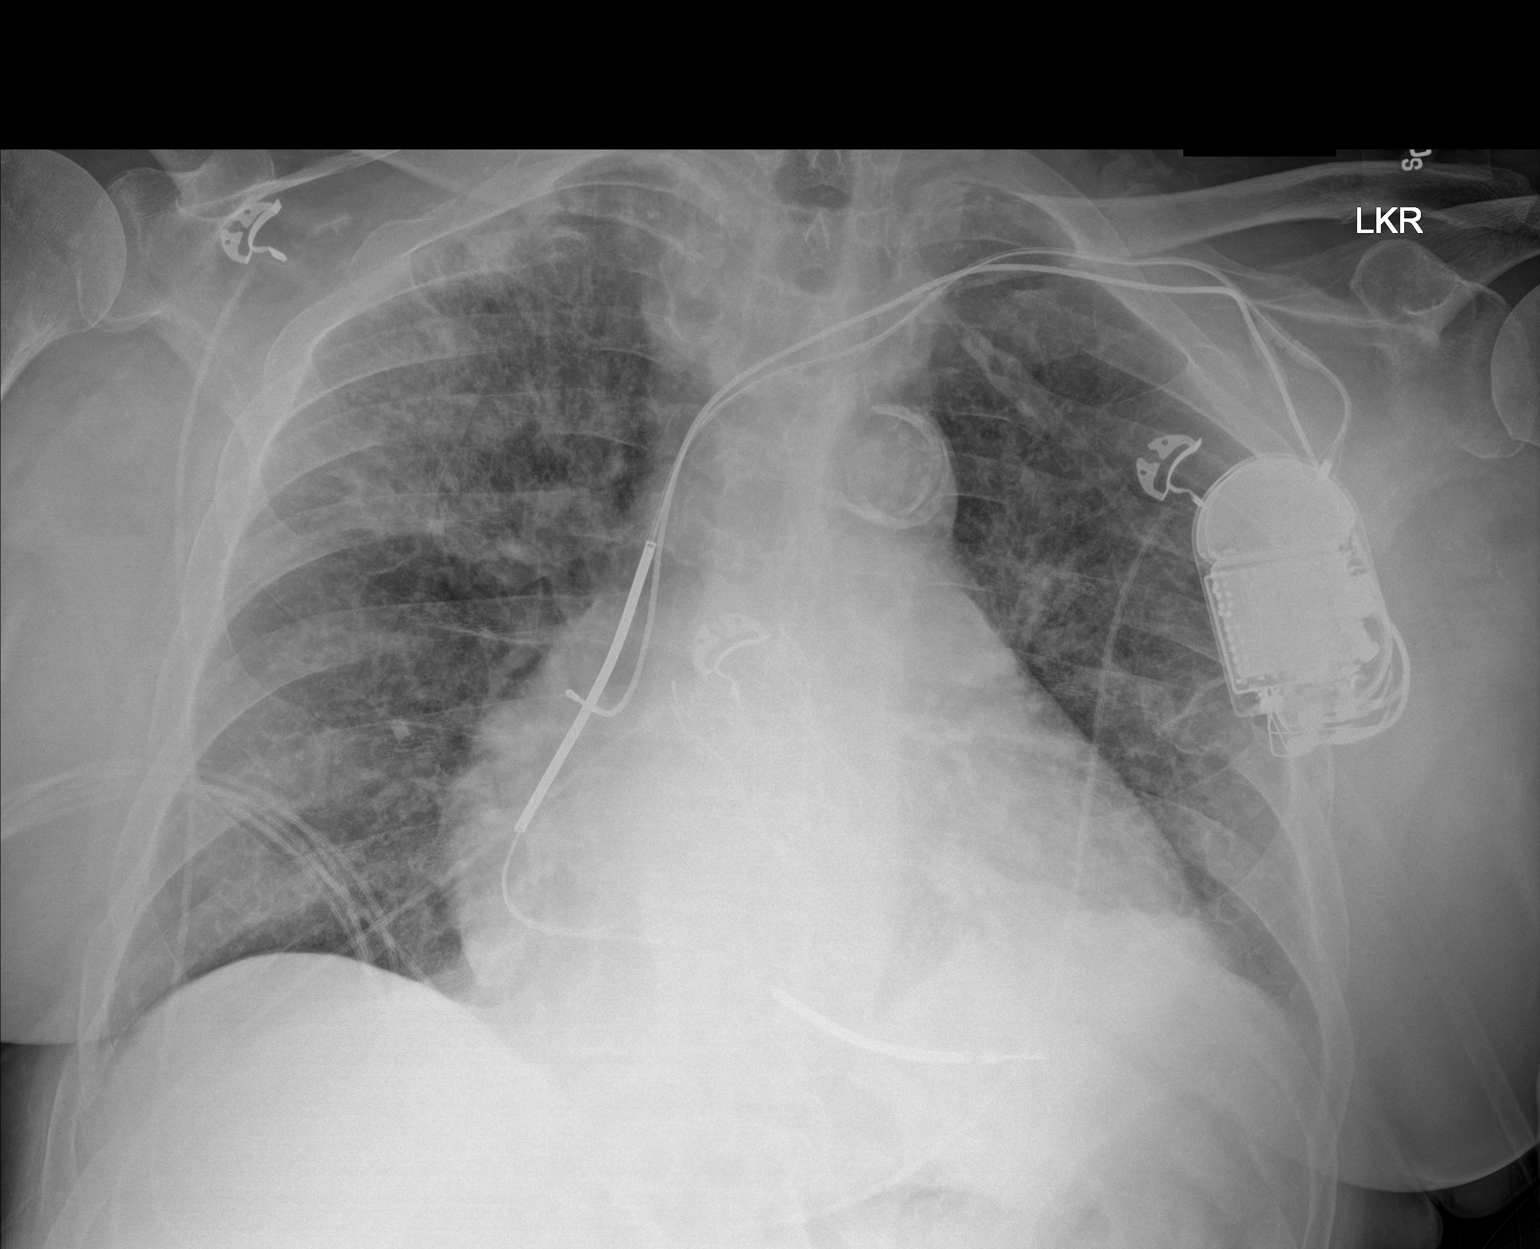

[1 of 1 positions shown; findings below may reference images not displayed]

FINDINGS: Stable cardiomegaly. Atherosclerosis of thoracic aorta is noted.
Left-sided pacemaker is unchanged in position. No pneumothorax or
pleural effusion is noted. Stable bilateral lung opacities are noted
which may represent either edema or pneumonia. Bony thorax is
unremarkable.
IMPRESSION: Stable bilateral lung opacities as described above.

Aortic Atherosclerosis (48F97-T1Y.Y).

## 2019-12-07 IMAGING — DX PORTABLE CHEST - 1 VIEW
1 series · 1 of 1 positions shown · non-contrast
Comparison: 01/21/2019 chest radiograph.

CLINICAL DATA: Dyspnea

EXAM:
PORTABLE CHEST 1 VIEW

[chest ap]
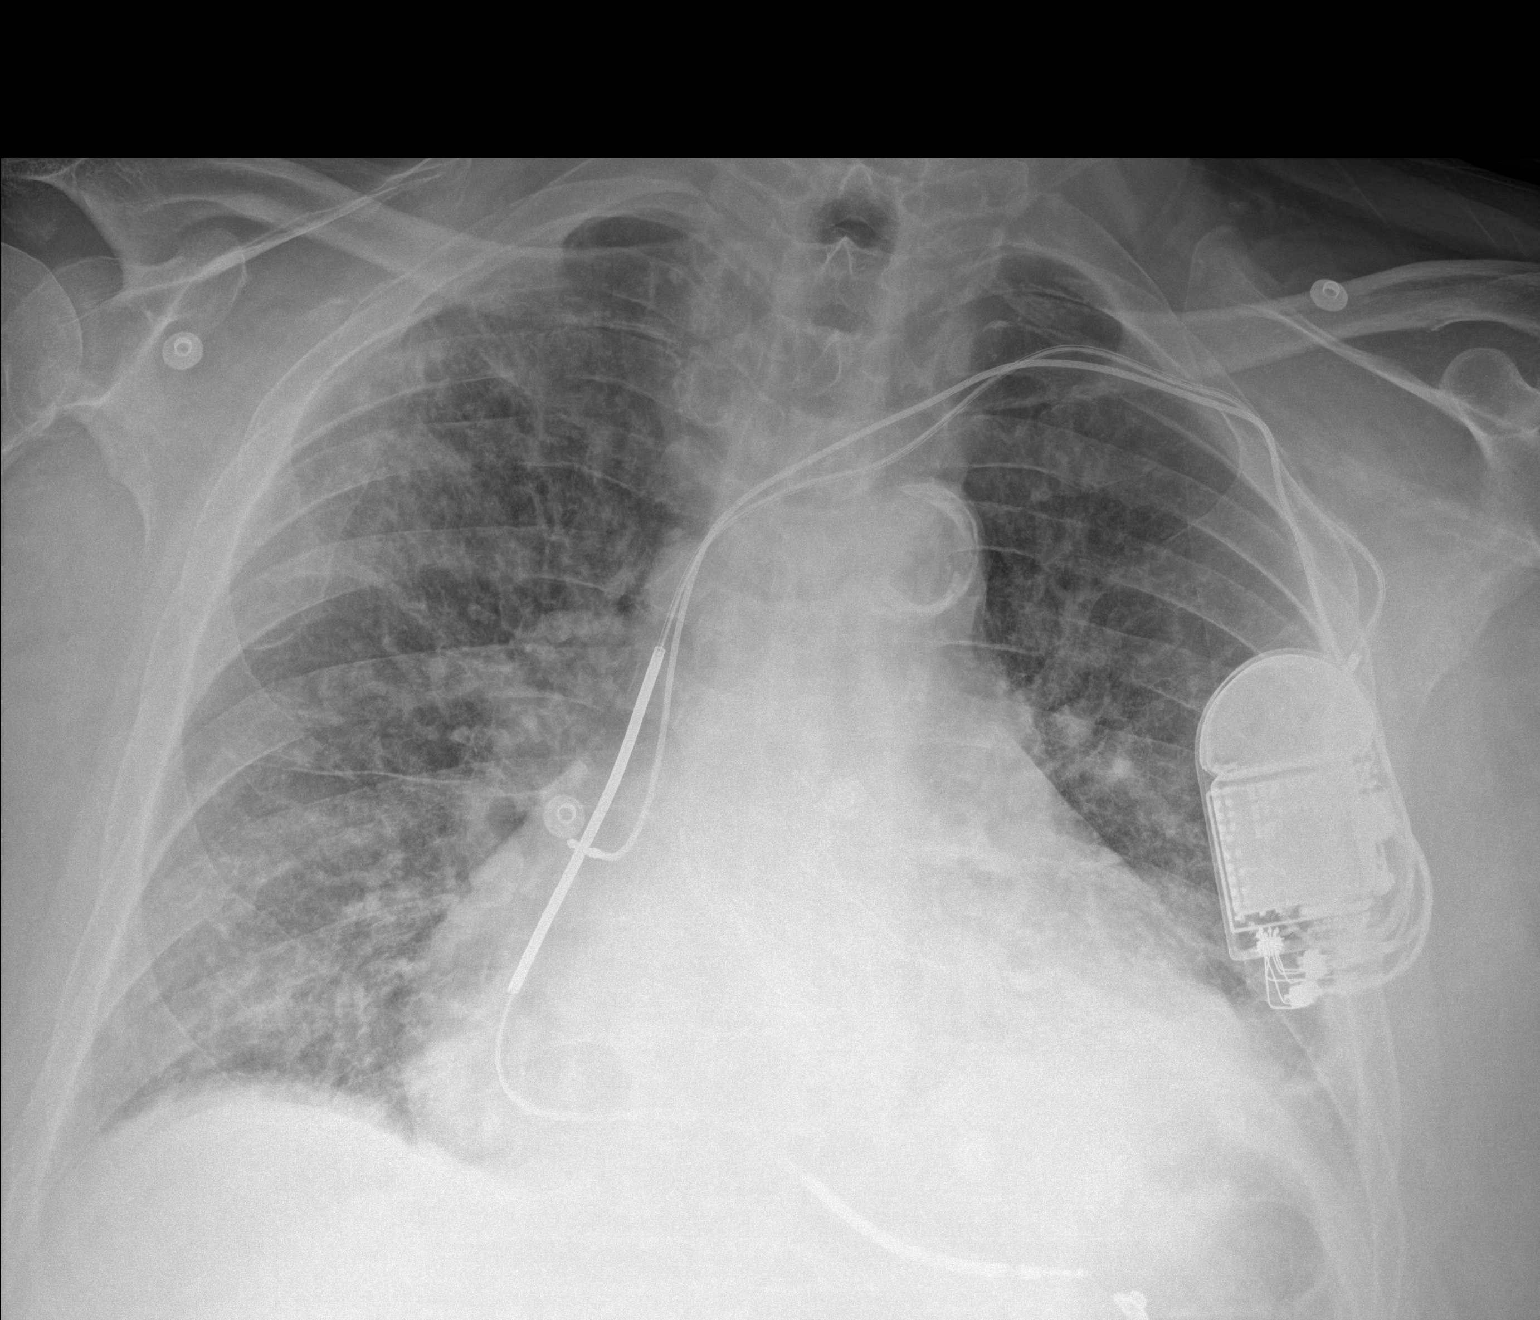

[1 of 1 positions shown; findings below may reference images not displayed]

FINDINGS: Stable configuration of 2 lead left subclavian ICD. Stable
cardiomediastinal silhouette with moderate cardiomegaly. Cardiac
valvular prosthesis is in place. No pneumothorax. No pleural
effusion. Mild-to-moderate pulmonary edema, slightly improved.
Stable mild left lung base patchy opacity.
IMPRESSION: 1. Mild-to-moderate congestive heart failure, slightly improved.
2. Stable mild patchy left lung base opacity, favor atelectasis.

## 2019-12-23 ENCOUNTER — Telehealth (HOSPITAL_COMMUNITY): Payer: Self-pay

## 2019-12-23 NOTE — Telephone Encounter (Signed)
Had a telephone visit with Darius Taylor.  He states doing good.  He is getting ready for appt at [facility], for check up.  He states breathing is doing good, still on oxygen.  He denies any fluid in extremities or weight gain.  He states has all his medications and he is aware of how to take them.  He has a daughter that lives next door and helps him with transportation to appts and with preparing his meals.  He has no complaints today. He lives alone.  He has a PA through his insurance that visits with him.   He has had no hospitalizations recently.  Will continue to visit for heart failure.   [name] [phone]

## 2020-01-12 NOTE — Progress Notes (Signed)
Patient ID: Darius Taylor, male    DOB: 04/01/1946, 74 y.o.   MRN: [id number]  HPI  Darius Taylor is a 74 y/o male with a history of CAD, COPD, HTN, asthma, colon/ renal cancer, atrial fibrillation, previous tobacco use and chronic heart failure.   Echo report from 11/04/2018 reviewed and showed an EF of 50-55% along with moderate LVH, moderate TR and mild AR. Echo report from 03/29/17 reviewed and showed an EF of 55-60% along with an elevated PA pressure of 71 mmHg.  Was in the ED 12/13/19 due to fatigue and anemia. Dental oozing noted from recent dental extraction. Dental was consulted and suture placed. Was in the ED 08/01/2019 due to acute gout where he was treated and released. Was in the ED 07/29/2019 due to acute kidney injury where he was treated and release. Admitted 07/14/2019 due to acute HF exacerbation. Initially needed IV lasix and metolazone and then transitioned to oral diuretics. Given 1 unit of PRBC's and iron infusion. Discharged after 6 days.  He presents today for a follow-up visit with a chief complaint of moderate fatigue upon minimal exertion. He describes this as chronic in nature having been present for several years. He has associated shortness of breath, light-headedness, difficulty sleeping and hematuria along with this. He denies any abdominal distention, palpitations, pedal edema, chest pain, cough or weight gain.   Says that he takes his morning furosemide ~ 8am and then takes his second dose right before going to bed at night and is "up and down all night".    Past Medical History:  Diagnosis Date  . Aortic stenosis   . Arrhythmia    atrial fibrillation  . Asthma   . Atrial fibrillation ([place])   . CAD (coronary artery disease)   . Cancer [facility])    colon, bladder and kidney  . CHF (congestive heart failure) ([place])   . 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 COPD (chronic obstructive pulmonary disease) ([place])   . H/O ventricular tachycardia   . Hypertension   . Nonischemic  cardiomyopathy ([place])    Status post AICD and pacemaker  . Renal cell carcinoma [facility])    Past Surgical History:  Procedure Laterality Date  . CARDIAC DEFIBRILLATOR PLACEMENT    . CARDIAC DEFIBRILLATOR PLACEMENT    . COLONOSCOPY WITH PROPOFOL N/A 06/10/2018   Procedure: COLONOSCOPY WITH PROPOFOL;  Surgeon: Lin Landsman, MD;  Location: [facility] ENDOSCOPY;  Service: Gastroenterology;  Laterality: N/A;  . CORONARY ANGIOPLASTY  09/25/2016  . IRRIGATION AND DEBRIDEMENT HEMATOMA Left 10/24/2016   Procedure: IRRIGATION AND DEBRIDEMENT HEMATOMA;  Surgeon: Florene Glen, MD;  Location: [facility] ORS;  Service: General;  Laterality: Left;  . KIDNEY SURGERY     left ne[hrectomy for RCC  . PACEMAKER IMPLANT    . PARTIAL COLECTOMY     Family History  Problem Relation Age of Onset  . Hypertension Mother   . Stroke Father    Social History   Tobacco Use  . Smoking status: Former Research scientist (life sciences)  . Smokeless tobacco: Never Used  Substance Use Topics  . Alcohol use: No   No Known Allergies  Prior to Admission medications   Medication Sig Start Date End Date Taking? Authorizing Provider  acetaminophen (TYLENOL) 325 MG tablet Take 650 mg by mouth every 6 (six) hours as needed for mild pain or fever.    Yes [provider]  amiodarone (PACERONE) 200 MG tablet Take 200 mg by mouth daily.   Yes [provider]  apixaban (ELIQUIS) 5 MG TABS tablet Take 5 mg by mouth 2 (two) times daily.   Yes [provider]  aspirin 81 MG chewable tablet Chew 81 mg by mouth daily.   Yes [provider]  atorvastatin (LIPITOR) 80 MG tablet Take 80 mg by mouth daily. 12/02/18  Yes [provider]  cholecalciferol (VITAMIN D) 1000 units tablet Take 1,000 Units by mouth daily.   Yes [provider]  ferrous gluconate (FERGON) 324 MG tablet Take 324 mg by mouth daily with breakfast.   Yes [provider]  furosemide (LASIX) 80 MG tablet Take 80 mg by mouth 2 (two)  times daily.   Yes [provider]  ipratropium-albuterol (DUONEB) 0.5-2.5 (3) MG/3ML SOLN Take 3 mLs by nebulization 3 (three) times daily. 03/05/19  Yes Lang Snow, NP  magnesium oxide (MAG-OX) 400 MG tablet Take 400 mg by mouth 1 day or 1 dose.    Yes [provider]  metoprolol (TOPROL-XL) 200 MG 24 hr tablet Take 200 mg by mouth daily.   Yes [provider]  pantoprazole (PROTONIX) 40 MG tablet Take 40 mg by mouth daily.   Yes [provider]  spironolactone (ALDACTONE) 25 MG tablet Take 25 mg by mouth daily.   Yes [provider]  tiotropium (SPIRIVA) 18 MCG inhalation capsule Place 18 mcg into inhaler and inhale daily.   Yes [provider]  vitamin B-12 1000 MCG tablet Take 1 tablet (1,000 mcg total) by mouth daily. 03/05/19  Yes Lang Snow, NP     Review of Systems  Constitutional: Positive for fatigue (easily). Negative for appetite change.  HENT: Negative for congestion, postnasal drip and sore throat.   Eyes: Negative.   Respiratory: Positive for shortness of breath (with moderate exertion). Negative for cough.   Cardiovascular: Negative for chest pain, palpitations and leg swelling.  Gastrointestinal: Negative for abdominal distention and abdominal pain.  Endocrine: Negative.   Genitourinary: Positive for hematuria (at times).  Musculoskeletal: Negative for back pain and neck pain.  Skin: Negative.   Allergic/Immunologic: Negative.   Neurological: Positive for light-headedness (if change positions too quickly). Negative for dizziness.  Hematological: Negative for adenopathy. Bruises/bleeds easily.  Psychiatric/Behavioral: Positive for sleep disturbance (up at night as he's taking diuretic right before bedtime). Negative for dysphoric mood. The patient is not nervous/anxious.    Vitals:   01/13/20 1130  BP: 131/86  Pulse: 65  Resp: 20  SpO2: 95%  Weight: 209 lb 4 oz (94.9 kg)  Height: 5\' 11"   (1.803 m)   Wt Readings from Last 3 Encounters:  01/13/20 209 lb 4 oz (94.9 kg)  10/15/19 206 lb 9.6 oz (93.7 kg)  08/07/19 202 lb (91.6 kg)   Lab Results  Component Value Date   CREATININE 1.45 (H) 03/05/2019   CREATININE 1.71 (H) 03/04/2019   CREATININE 1.76 (H) 03/03/2019    Physical Exam Vitals and nursing note reviewed.  Constitutional:      Appearance: He is well-developed.  HENT:     Head: Normocephalic and atraumatic.  Neck:     Vascular: No JVD.  Cardiovascular:     Rate and Rhythm: Normal rate and regular rhythm.  Pulmonary:     Effort: Pulmonary effort is normal. No respiratory distress.     Breath sounds: No wheezing, rhonchi or rales.  Abdominal:     Palpations: Abdomen is soft.     Tenderness: There is no abdominal tenderness.  Musculoskeletal:     Cervical  back: Neck supple.     Right lower leg: No tenderness. No edema.     Left lower leg: No tenderness. No edema.  Skin:    General: Skin is warm and dry.  Neurological:     General: No focal deficit present.     Mental Status: He is alert and oriented to person, place, and time.  Psychiatric:        Mood and Affect: Mood normal.        Behavior: Behavior normal.    Assessment & Plan:  1: Chronic heart failure with preserved ejection fraction with structural changes- - NYHA class III - euvolemic today - weighing daily; instructed to call for an overnight weight gain of >2 pounds or a weekly weight gain of >5 pounds - weight up 3 pounds from last visit here 3 months ago - not adding salt to his food; reviewed the importance of closely following a 2000mg  sodium diet  - saw cardiology ([place]) 12/30/19 - renal function does not allow for entresto - BNP 02/25/2019 was 399.0 - participating in paramedicine program - has [facility] home health and PT at the moment - advised patient that if he takes his morning diuretic around 8am, that he could take his second dose between 2-3pm instead of right before going to  sleep at night  2: HTN-  - BP looks good today - follows with PCP at [place] 12/16/19 reviewed and showed sodium 138, potassium 4.0, creatinine 2.15 and GFR 29  3: COPD- - wearing oxygen at 3L if needed    Patient did not bring his medications nor a list. Each medication was verbally reviewed with the patient and he was encouraged to bring the bottles to every visit to confirm accuracy of list.   Return in 6 months or sooner for any questions/problems before then.

## 2020-01-13 ENCOUNTER — Ambulatory Visit: Payer: Medicare HMO | Attending: Family | Admitting: Family

## 2020-01-13 ENCOUNTER — Other Ambulatory Visit: Payer: Self-pay

## 2020-01-13 ENCOUNTER — Encounter: Payer: Self-pay | Admitting: Family

## 2020-01-13 VITALS — BP 131/86 | HR 65 | Resp 20 | Ht 71.0 in | Wt 209.2 lb

## 2020-01-13 DIAGNOSIS — Z85038 Personal history of other malignant neoplasm of large intestine: Secondary | ICD-10-CM | POA: Insufficient documentation

## 2020-01-13 DIAGNOSIS — I428 Other cardiomyopathies: Secondary | ICD-10-CM | POA: Insufficient documentation

## 2020-01-13 DIAGNOSIS — Z87891 Personal history of nicotine dependence: Secondary | ICD-10-CM | POA: Insufficient documentation

## 2020-01-13 DIAGNOSIS — I5032 Chronic diastolic (congestive) heart failure: Secondary | ICD-10-CM | POA: Insufficient documentation

## 2020-01-13 DIAGNOSIS — Z9049 Acquired absence of other specified parts of digestive tract: Secondary | ICD-10-CM | POA: Insufficient documentation

## 2020-01-13 DIAGNOSIS — Z7901 Long term (current) use of anticoagulants: Secondary | ICD-10-CM | POA: Diagnosis not present

## 2020-01-13 DIAGNOSIS — Z8249 Family history of ischemic heart disease and other diseases of the circulatory system: Secondary | ICD-10-CM | POA: Insufficient documentation

## 2020-01-13 DIAGNOSIS — Z95810 Presence of automatic (implantable) cardiac defibrillator: Secondary | ICD-10-CM | POA: Insufficient documentation

## 2020-01-13 DIAGNOSIS — Z7982 Long term (current) use of aspirin: Secondary | ICD-10-CM | POA: Diagnosis not present

## 2020-01-13 DIAGNOSIS — I509 Heart failure, unspecified: Secondary | ICD-10-CM | POA: Diagnosis present

## 2020-01-13 DIAGNOSIS — Z85528 Personal history of other malignant neoplasm of kidney: Secondary | ICD-10-CM | POA: Diagnosis not present

## 2020-01-13 DIAGNOSIS — I2510 Atherosclerotic heart disease of native coronary artery without angina pectoris: Secondary | ICD-10-CM | POA: Insufficient documentation

## 2020-01-13 DIAGNOSIS — Z823 Family history of stroke: Secondary | ICD-10-CM | POA: Insufficient documentation

## 2020-01-13 DIAGNOSIS — Z79899 Other long term (current) drug therapy: Secondary | ICD-10-CM | POA: Diagnosis not present

## 2020-01-13 DIAGNOSIS — I10 Essential (primary) hypertension: Secondary | ICD-10-CM

## 2020-01-13 DIAGNOSIS — I110 Hypertensive heart disease with heart failure: Secondary | ICD-10-CM | POA: Diagnosis not present

## 2020-01-13 DIAGNOSIS — Z9981 Dependence on supplemental oxygen: Secondary | ICD-10-CM | POA: Diagnosis not present

## 2020-01-13 DIAGNOSIS — J449 Chronic obstructive pulmonary disease, unspecified: Secondary | ICD-10-CM

## 2020-01-13 DIAGNOSIS — Z9861 Coronary angioplasty status: Secondary | ICD-10-CM | POA: Insufficient documentation

## 2020-01-13 NOTE — Patient Instructions (Signed)
Continue weighing daily and call for an overnight weight gain of > 2 pounds or a weekly weight gain of >5 pounds. 

## 2020-02-09 ENCOUNTER — Telehealth (HOSPITAL_COMMUNITY): Payer: Self-pay

## 2020-02-09 NOTE — Telephone Encounter (Signed)
Today contacted Darius Taylor.  He has been doing good.  Has not been in the hospital in a few months.  He states breathing been good and swelling.  He has all his medications and aware of how to take them.  His daughter places them out for him.  His daughter lives next door and keeps a very close eye on him.  Send him food and takes him to his appts.  She is aware of his up coming appts.  He denies any chest pain or problems today.  He states appetite is good.  I will be discharging him from the program and [name] with HF clinic is in agreement.  He is aware to call if any problems arises and he can be added back to the program.   [place] [phone]

## 2020-02-19 ENCOUNTER — Other Ambulatory Visit: Payer: Self-pay

## 2020-02-19 ENCOUNTER — Emergency Department
Admission: EM | Admit: 2020-02-19 | Discharge: 2020-02-19 | Disposition: A | Discharge status: Home / Self Care | Payer: Medicare HMO | Attending: Emergency Medicine | Admitting: Emergency Medicine

## 2020-02-19 ENCOUNTER — Encounter: Payer: Self-pay | Admitting: Emergency Medicine

## 2020-02-19 DIAGNOSIS — I5032 Chronic diastolic (congestive) heart failure: Secondary | ICD-10-CM | POA: Insufficient documentation

## 2020-02-19 DIAGNOSIS — Z950 Presence of cardiac pacemaker: Secondary | ICD-10-CM | POA: Insufficient documentation

## 2020-02-19 DIAGNOSIS — I2510 Atherosclerotic heart disease of native coronary artery without angina pectoris: Secondary | ICD-10-CM | POA: Insufficient documentation

## 2020-02-19 DIAGNOSIS — M109 Gout, unspecified: Secondary | ICD-10-CM | POA: Diagnosis not present

## 2020-02-19 DIAGNOSIS — J449 Chronic obstructive pulmonary disease, unspecified: Secondary | ICD-10-CM | POA: Diagnosis not present

## 2020-02-19 DIAGNOSIS — Z87891 Personal history of nicotine dependence: Secondary | ICD-10-CM | POA: Diagnosis not present

## 2020-02-19 DIAGNOSIS — Z7901 Long term (current) use of anticoagulants: Secondary | ICD-10-CM | POA: Diagnosis not present

## 2020-02-19 DIAGNOSIS — Z79899 Other long term (current) drug therapy: Secondary | ICD-10-CM | POA: Insufficient documentation

## 2020-02-19 DIAGNOSIS — I110 Hypertensive heart disease with heart failure: Secondary | ICD-10-CM | POA: Insufficient documentation

## 2020-02-19 DIAGNOSIS — M79675 Pain in left toe(s): Secondary | ICD-10-CM | POA: Diagnosis present

## 2020-02-19 MED ORDER — OXYCODONE HCL 5 MG PO TABS: 5.0000 mg | ORAL_TABLET | Freq: Three times a day (TID) | ORAL | 0 refills | Status: DC | PRN

## 2020-02-19 MED ORDER — PREDNISONE 10 MG PO TABS: ORAL_TABLET | ORAL | 0 refills | Status: DC

## 2020-02-19 NOTE — ED Triage Notes (Signed)
Swelling/redness left 2nd toe.  No increased warmth felt to toe.  + pain. Hx gout. No fever. Does not remember injury. Pt is not diabetic.

## 2020-02-19 NOTE — ED Provider Notes (Signed)
[facility] Emergency Department Provider Note   ____________________________________________   First MD Initiated Contact with Patient 02/19/20 1402     (approximate)  I have reviewed the triage vital signs and the nursing notes.   HISTORY  Chief Complaint Gout   HPI Darius Taylor is a 74 y.o. male presents to the ED with complaint of acute gout to his left foot.  Patient states that his second toe became red, swollen with some pain.  Patient denies any recent injury.  Patient is not diabetic.  Patient has a history of gout and currently takes colchicine 0.6 mg 1 daily to "prevent gout".      Past Medical History:  Diagnosis Date  . Aortic stenosis   . Arrhythmia    atrial fibrillation  . Asthma   . Atrial fibrillation ([place])   . CAD (coronary artery disease)   . Cancer [facility])    colon, bladder and kidney  . CHF (congestive heart failure) ([place])   . 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 COPD (chronic obstructive pulmonary disease) ([place])   . H/O ventricular tachycardia   . Hypertension   . Nonischemic cardiomyopathy ([place])    Status post AICD and pacemaker  . Renal cell carcinoma [facility])     Patient Active Problem List   Diagnosis Date Noted  . Goals of care, counseling/discussion   . Palliative care encounter   . Acute on chronic diastolic CHF (congestive heart failure) ([place]) 02/23/2019  . Diastolic CHF, acute on chronic ([place]) 01/25/2019  . CAD (coronary artery disease) 12/30/2018  . Acute on chronic renal failure ([place]) 12/30/2018  . Alcoholic intoxication without complication ([place]) 16/06/9603  . CHF exacerbation ([place]) 11/03/2018  . Chronic diastolic heart failure ([place]) 09/24/2018  . HTN (hypertension) 09/24/2018  . Acute on chronic respiratory failure with hypoxemia ([place]) 08/30/2018  . History of colon cancer   . COPD exacerbation ([place]) 01/08/2018  . Cellulitis 11/18/2017  . Hematoma     Past Surgical History:  Procedure Laterality Date    . CARDIAC DEFIBRILLATOR PLACEMENT    . CARDIAC DEFIBRILLATOR PLACEMENT    . COLONOSCOPY WITH PROPOFOL N/A 06/10/2018   Procedure: COLONOSCOPY WITH PROPOFOL;  Surgeon: Lin Landsman, MD;  Location: [facility] ENDOSCOPY;  Service: Gastroenterology;  Laterality: N/A;  . CORONARY ANGIOPLASTY  09/25/2016  . IRRIGATION AND DEBRIDEMENT HEMATOMA Left 10/24/2016   Procedure: IRRIGATION AND DEBRIDEMENT HEMATOMA;  Surgeon: Florene Glen, MD;  Location: [facility] ORS;  Service: General;  Laterality: Left;  . KIDNEY SURGERY     left ne[hrectomy for RCC  . PACEMAKER IMPLANT    . PARTIAL COLECTOMY      Prior to Admission medications   Medication Sig Start Date End Date Taking? Authorizing Provider  colchicine 0.6 MG tablet Take 0.6 mg by mouth daily.   Yes [provider]  acetaminophen (TYLENOL) 325 MG tablet Take 650 mg by mouth every 6 (six) hours as needed for mild pain or fever.     [provider]  amiodarone (PACERONE) 200 MG tablet Take 200 mg by mouth daily.    [provider]  apixaban (ELIQUIS) 5 MG TABS tablet Take 5 mg by mouth 2 (two) times daily.    [provider]  cholecalciferol (VITAMIN D) 1000 units tablet Take 1,000 Units by mouth daily.    [provider]  ferrous gluconate (FERGON) 324 MG tablet Take 324 mg by mouth daily with breakfast.    [provider]  furosemide (  LASIX) 80 MG tablet Take 80 mg by mouth 2 (two) times daily.    [provider]  ipratropium-albuterol (DUONEB) 0.5-2.5 (3) MG/3ML SOLN Take 3 mLs by nebulization 3 (three) times daily. 03/05/19   Lang Snow, NP  magnesium oxide (MAG-OX) 400 MG tablet Take 400 mg by mouth 1 day or 1 dose.     [provider]  metoprolol (TOPROL-XL) 200 MG 24 hr tablet Take 200 mg by mouth daily.    [provider]  oxyCODONE (ROXICODONE) 5 MG immediate release tablet Take 1 tablet (5 mg total) by mouth every 8 (eight) hours as needed for severe  pain. 02/19/20 02/18/21  Johnn Hai, PA-C  predniSONE (DELTASONE) 10 MG tablet Take 3 tablets once a day for 4 days 02/19/20   Johnn Hai, PA-C  tiotropium [facility]) 18 MCG inhalation capsule Place 18 mcg into inhaler and inhale daily.    [provider]  vitamin B-12 1000 MCG tablet Take 1 tablet (1,000 mcg total) by mouth daily. 03/05/19   Lang Snow, NP    Allergies Patient has no known allergies.  Family History  Problem Relation Age of Onset  . Hypertension Mother   . Stroke Father     Social History Social History   Tobacco Use  . Smoking status: Former Research scientist (life sciences)  . Smokeless tobacco: Never Used  Substance Use Topics  . Alcohol use: No  . Drug use: No    Review of Systems Constitutional: No fever/chills Eyes: No visual changes. Cardiovascular: Denies chest pain. Respiratory: Denies shortness of breath. Gastrointestinal:  No nausea, no vomiting.   Musculoskeletal: Positive for left second toe pain. Skin: Erythema left second toe. Neurological: Negative for headaches, focal weakness or numbness. ___________________________________________   PHYSICAL EXAM:  VITAL SIGNS: ED Triage Vitals [02/19/20 1356]  Enc Vitals Group     BP 117/76     Pulse Rate 63     Resp 18     Temp 98.7 F (37.1 C)     Temp Source Oral     SpO2 94 %     Weight 202 lb (91.6 kg)     Height 5\' 11"  (1.803 m)     Head Circumference      Peak Flow      Pain Score 8     Pain Loc      Pain Edu?      Excl. in [place]?     Constitutional: Alert and oriented. Well appearing and in no acute distress. Eyes: Conjunctivae are normal.  Head: Atraumatic. Neck: No stridor.   Cardiovascular: Normal rate, regular rhythm. Grossly normal heart sounds.  Good peripheral circulation. Respiratory: Normal respiratory effort.  No retractions. Lungs CTAB. Musculoskeletal: On examination of the left second toe there is moderate erythema and slight warmth.  No skin trauma or open  wounds.  No drainage present.  Range of motion is decreased secondary to pain. Neurologic:  Normal speech and language. No gross focal neurologic deficits are appreciated.  Skin:  Skin is warm, dry and intact.  Erythema left foot, second digit. Psychiatric: Mood and affect are normal. Speech and behavior are normal.  ____________________________________________   LABS (all labs ordered are listed, but only abnormal results are displayed)  Labs Reviewed - No data to display  PROCEDURES  Procedure(s) performed (including Critical Care):  Procedures   ____________________________________________   INITIAL IMPRESSION / ASSESSMENT AND PLAN / ED COURSE  As part of my medical decision making, I reviewed  the following data within the electronic MEDICAL RECORD NUMBER Notes from prior ED visits and [place] Controlled Substance Database  74 year old male presents to the ED with complaint of left second toe pain.  Has a history of gout and states that he takes colchicine once a day for gout prevention.  He denies any trauma to his left foot.  Patient is not diabetic.  On exam there is no skin wound or drainage present.  The digit is erythematous and slightly warm to touch.  Moderately tender and range of motion is guarded secondary to discomfort.  Without history of injury and no open wounds most likely this is his gout.  Patient will continue taking his colchicine.  A prescription for prednisone 30 mg once a day for the next 4 days and oxycodone 5 mg 1 every 8 hours as needed for moderate to severe pain if needed.  He is to follow-up with his PCP.  He was also told return to the emergency department if any severe worsening of his symptoms.   ____________________________________________   FINAL CLINICAL IMPRESSION(S) / ED DIAGNOSES  Final diagnoses:  Acute gout involving toe of left foot, unspecified cause     ED Discharge Orders         Ordered    predniSONE (DELTASONE) 10 MG tablet  Status:   Discontinued     02/19/20 1453    oxyCODONE (ROXICODONE) 5 MG immediate release tablet  Every 8 hours PRN,   Status:  Discontinued     02/19/20 1453    oxyCODONE (ROXICODONE) 5 MG immediate release tablet  Every 8 hours PRN     02/19/20 1518    predniSONE (DELTASONE) 10 MG tablet     02/19/20 1518           Note:  This document was prepared using Dragon voice recognition software and may include unintentional dictation errors.    Johnn Hai, PA-C 02/19/20 1521    Blake Divine, MD 02/20/20 [phone]

## 2020-02-19 NOTE — Discharge Instructions (Signed)
Follow-up with your primary care provider if any continued problems.  Continue taking colchicine as directed by your doctor.  Return to the emergency department if any severe worsening of your foot pain over the holiday weekend.  A prescription for prednisone 3 tablets once daily for the next 4 days and a prescription for oxycodone was sent to your pharmacy.  Elevate your foot to reduce swelling which should help with pain control.

## 2020-02-19 NOTE — ED Notes (Signed)
See triage note  Presents with swelling to left 2nd toe  Some redness

## 2020-03-29 ENCOUNTER — Inpatient Hospital Stay
Admission: EM | Admit: 2020-03-29 | Discharge: 2020-04-05 | DRG: 637 | Disposition: A | Discharge status: Home Health | Payer: Medicare HMO | Attending: Internal Medicine | Admitting: Internal Medicine

## 2020-03-29 ENCOUNTER — Encounter: Payer: Self-pay | Admitting: Emergency Medicine

## 2020-03-29 DIAGNOSIS — I428 Other cardiomyopathies: Secondary | ICD-10-CM | POA: Diagnosis present

## 2020-03-29 DIAGNOSIS — E1122 Type 2 diabetes mellitus with diabetic chronic kidney disease: Secondary | ICD-10-CM | POA: Diagnosis present

## 2020-03-29 DIAGNOSIS — Z79899 Other long term (current) drug therapy: Secondary | ICD-10-CM

## 2020-03-29 DIAGNOSIS — Z905 Acquired absence of kidney: Secondary | ICD-10-CM

## 2020-03-29 DIAGNOSIS — J4489 Other specified chronic obstructive pulmonary disease: Secondary | ICD-10-CM

## 2020-03-29 DIAGNOSIS — Z85038 Personal history of other malignant neoplasm of large intestine: Secondary | ICD-10-CM

## 2020-03-29 DIAGNOSIS — R310 Gross hematuria: Secondary | ICD-10-CM | POA: Diagnosis present

## 2020-03-29 DIAGNOSIS — Z20822 Contact with and (suspected) exposure to covid-19: Secondary | ICD-10-CM | POA: Diagnosis present

## 2020-03-29 DIAGNOSIS — E1100 Type 2 diabetes mellitus with hyperosmolarity without nonketotic hyperglycemic-hyperosmolar coma (NKHHC): Principal | ICD-10-CM

## 2020-03-29 DIAGNOSIS — Z95810 Presence of automatic (implantable) cardiac defibrillator: Secondary | ICD-10-CM

## 2020-03-29 DIAGNOSIS — I10 Essential (primary) hypertension: Secondary | ICD-10-CM | POA: Diagnosis present

## 2020-03-29 DIAGNOSIS — E1165 Type 2 diabetes mellitus with hyperglycemia: Secondary | ICD-10-CM | POA: Diagnosis present

## 2020-03-29 DIAGNOSIS — N179 Acute kidney failure, unspecified: Secondary | ICD-10-CM | POA: Diagnosis present

## 2020-03-29 DIAGNOSIS — I2510 Atherosclerotic heart disease of native coronary artery without angina pectoris: Secondary | ICD-10-CM | POA: Diagnosis present

## 2020-03-29 DIAGNOSIS — Z85528 Personal history of other malignant neoplasm of kidney: Secondary | ICD-10-CM

## 2020-03-29 DIAGNOSIS — Z7901 Long term (current) use of anticoagulants: Secondary | ICD-10-CM

## 2020-03-29 DIAGNOSIS — Z950 Presence of cardiac pacemaker: Secondary | ICD-10-CM

## 2020-03-29 DIAGNOSIS — I482 Chronic atrial fibrillation, unspecified: Secondary | ICD-10-CM | POA: Diagnosis present

## 2020-03-29 DIAGNOSIS — J449 Chronic obstructive pulmonary disease, unspecified: Secondary | ICD-10-CM | POA: Diagnosis present

## 2020-03-29 DIAGNOSIS — I130 Hypertensive heart and chronic kidney disease with heart failure and stage 1 through stage 4 chronic kidney disease, or unspecified chronic kidney disease: Secondary | ICD-10-CM | POA: Diagnosis present

## 2020-03-29 DIAGNOSIS — E119 Type 2 diabetes mellitus without complications: Secondary | ICD-10-CM

## 2020-03-29 DIAGNOSIS — I5033 Acute on chronic diastolic (congestive) heart failure: Secondary | ICD-10-CM | POA: Diagnosis present

## 2020-03-29 DIAGNOSIS — Z952 Presence of prosthetic heart valve: Secondary | ICD-10-CM

## 2020-03-29 DIAGNOSIS — Z8249 Family history of ischemic heart disease and other diseases of the circulatory system: Secondary | ICD-10-CM

## 2020-03-29 DIAGNOSIS — N1832 Chronic kidney disease, stage 3b: Secondary | ICD-10-CM

## 2020-03-29 DIAGNOSIS — R519 Headache, unspecified: Secondary | ICD-10-CM | POA: Diagnosis present

## 2020-03-29 DIAGNOSIS — Z9049 Acquired absence of other specified parts of digestive tract: Secondary | ICD-10-CM

## 2020-03-29 DIAGNOSIS — Z7952 Long term (current) use of systemic steroids: Secondary | ICD-10-CM

## 2020-03-29 DIAGNOSIS — I5032 Chronic diastolic (congestive) heart failure: Secondary | ICD-10-CM | POA: Diagnosis present

## 2020-03-29 DIAGNOSIS — J9611 Chronic respiratory failure with hypoxia: Secondary | ICD-10-CM

## 2020-03-29 LAB — CBC
HCT: 34.2 % — ABNORMAL LOW (ref 39.0–52.0)
Hemoglobin: 11 g/dL — ABNORMAL LOW (ref 13.0–17.0)
MCH: 30.4 pg (ref 26.0–34.0)
MCHC: 32.2 g/dL (ref 30.0–36.0)
MCV: 94.5 fL (ref 80.0–100.0)
Platelets: 201 10*3/uL (ref 150–400)
RBC: 3.62 MIL/uL — ABNORMAL LOW (ref 4.22–5.81)
RDW: 14.8 % (ref 11.5–15.5)
WBC: 7.2 10*3/uL (ref 4.0–10.5)
nRBC: 0 % (ref 0.0–0.2)

## 2020-03-29 LAB — URINALYSIS, COMPLETE (UACMP) WITH MICROSCOPIC
Bilirubin Urine: NEGATIVE
Glucose, UA: >=500 mg/dL — AB
Ketones, ur: NEGATIVE mg/dL
Leukocytes,Ua: NEGATIVE
Nitrite: NEGATIVE
Protein, ur: NEGATIVE mg/dL
Specific Gravity, Urine: 1.022 (ref 1.005–1.030)
pH: 5 (ref 5.0–8.0)

## 2020-03-29 LAB — BASIC METABOLIC PANEL
Anion gap: 10 (ref 5–15)
BUN: 49 mg/dL — ABNORMAL HIGH (ref 8–23)
CO2: 25 mmol/L (ref 22–32)
Calcium: 8.6 mg/dL — ABNORMAL LOW (ref 8.9–10.3)
Chloride: 87 mmol/L — ABNORMAL LOW (ref 98–111)
Creatinine, Ser: 2.4 mg/dL — ABNORMAL HIGH (ref 0.61–1.24)
GFR calc Af Amer: 29 mL/min — ABNORMAL LOW (ref >60)
GFR calc non Af Amer: 25 mL/min — ABNORMAL LOW (ref >60)
Glucose, Bld: 1060 mg/dL (ref 70–99)
Potassium: 4.4 mmol/L (ref 3.5–5.1)
Sodium: 122 mmol/L — ABNORMAL LOW (ref 135–145)

## 2020-03-29 LAB — TROPONIN I (HIGH SENSITIVITY): Troponin I (High Sensitivity): 24 ng/L — ABNORMAL HIGH (ref <18)

## 2020-03-29 MED ORDER — DEXTROSE-NACL 5-0.45 % IV SOLN: INTRAVENOUS | Status: DC

## 2020-03-29 MED ORDER — SODIUM CHLORIDE 0.9 % IV SOLN: INTRAVENOUS | Status: DC

## 2020-03-29 MED ORDER — SODIUM CHLORIDE 0.9 % IV SOLN: Freq: Once | INTRAVENOUS | Status: AC

## 2020-03-29 MED ORDER — POTASSIUM CHLORIDE 10 MEQ/100ML IV SOLN
10.0000 meq | INTRAVENOUS | Status: DC
  Filled 2020-03-29: qty 100

## 2020-03-29 MED ORDER — DEXTROSE 50 % IV SOLN: 0.0000 mL | INTRAVENOUS | Status: DC | PRN

## 2020-03-29 MED ORDER — INSULIN REGULAR(HUMAN) IN NACL 100-0.9 UT/100ML-% IV SOLN
INTRAVENOUS | Status: DC
  Administered 2020-03-30: 11.5 [IU]/h via INTRAVENOUS
  Filled 2020-03-29: qty 100

## 2020-03-29 NOTE — ED Notes (Signed)
Pt states coming in due to weakness this week. Pt denies history of diabetes. Pt states but no vomiting.. Pt is alert in room. Pt has very deep voice. Pt states it is normal.

## 2020-03-29 NOTE — ED Provider Notes (Signed)
[facility] Emergency Department Provider Note  ____________________________________________   First MD Initiated Contact with Patient 03/29/20 2319     (approximate)  I have reviewed the triage vital signs and the nursing notes.   HISTORY  Chief Complaint Weakness    HPI Darius Taylor is a 74 y.o. male with below list of previous medical conditions including COPD hypertension nonischemic or myopathy renal cell carcinoma and congestive heart failure presents to the emergency department secondary to generalized weakness which patient states began last week and has progressively worsened since onset.  Patient also admits to polyuria and polydipsia.  Patient denies any recent illness no fever.  Patient does admit however to nausea with eating.  Patient denies any abdominal discomfort.  Patient also admits to nonbloody diarrhea.     Past Medical History:  Diagnosis Date  . Aortic stenosis   . Arrhythmia    atrial fibrillation  . Asthma   . Atrial fibrillation ([place])   . CAD (coronary artery disease)   . Cancer [facility])    colon, bladder and kidney  . CHF (congestive heart failure) ([name])   . 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 COPD (chronic obstructive pulmonary disease) ([name])   . H/O ventricular tachycardia   . Hypertension   . Nonischemic cardiomyopathy ([name])    Status post AICD and pacemaker  . Renal cell carcinoma [facility])     Patient Active Problem List   Diagnosis Date Noted  . COPD with chronic bronchitis ([name]) 03/29/2020  . Chronic kidney disease (CKD) stage G3b/A3, moderately decreased glomerular filtration rate (GFR) between 30-44 mL/min/1.73 square meter and albuminuria creatinine ratio greater than 300 mg/g 03/29/2020  . Hyperosmolar hyperglycemic state (HHS) ([name]) 03/29/2020  . New onset type 2 diabetes mellitus ([name]) 03/29/2020  . 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 Goals of care, counseling/discussion   .  Palliative care encounter   . Acute on chronic diastolic CHF (congestive heart failure) ([name]) 02/23/2019  . Diastolic CHF, acute on chronic ([name]) 01/25/2019  . CAD (coronary artery disease) 12/30/2018  . Acute on chronic renal failure ([place]) 12/30/2018  . Alcoholic intoxication without complication ([name]) 62/26/3335  . CHF exacerbation ([name]) 11/03/2018  . Chronic diastolic heart failure ([name]) 09/24/2018  . HTN (hypertension) 09/24/2018  . Chronic respiratory failure with hypoxia ([name]) 08/30/2018  . History of colon cancer   . COPD exacerbation ([name]) 01/08/2018  . Cellulitis 11/18/2017  . History of colectomy secondary to colon cancer 03/27/2017  . History of nephrectomy secondary to [facility] 03/27/2017  . Hematoma   . Chronic atrial fibrillation ([name]) 03/26/2013    Past Surgical History:  Procedure Laterality Date  . CARDIAC DEFIBRILLATOR PLACEMENT    . CARDIAC DEFIBRILLATOR PLACEMENT    . COLONOSCOPY WITH PROPOFOL N/A 06/10/2018   Procedure: COLONOSCOPY WITH PROPOFOL;  Surgeon: Lin Landsman, MD;  Location: [facility] ENDOSCOPY;  Service: Gastroenterology;  Laterality: N/A;  . CORONARY ANGIOPLASTY  09/25/2016  . IRRIGATION AND DEBRIDEMENT HEMATOMA Left 10/24/2016   Procedure: IRRIGATION AND DEBRIDEMENT HEMATOMA;  Surgeon: Florene Glen, MD;  Location: [facility] ORS;  Service: General;  Laterality: Left;  . KIDNEY SURGERY     left ne[hrectomy for RCC  . PACEMAKER IMPLANT    . PARTIAL COLECTOMY      Prior to Admission medications   Medication Sig Start Date End Date Taking? Authorizing Provider  acetaminophen (TYLENOL) 325 MG tablet Take 650 mg by mouth every 6 (six) hours as needed for mild  pain or fever.     [provider]  amiodarone (PACERONE) 200 MG tablet Take 200 mg by mouth daily.    [provider]  apixaban (ELIQUIS) 5 MG TABS tablet Take 5 mg by mouth 2 (two) times daily.    [provider]  cholecalciferol (VITAMIN D) 1000 units tablet Take 1,000  Units by mouth daily.    [provider]  colchicine 0.6 MG tablet Take 0.6 mg by mouth daily.    [provider]  ferrous gluconate (FERGON) 324 MG tablet Take 324 mg by mouth daily with breakfast.    [provider]  furosemide (LASIX) 80 MG tablet Take 80 mg by mouth 2 (two) times daily.    [provider]  ipratropium-albuterol (DUONEB) 0.5-2.5 (3) MG/3ML SOLN Take 3 mLs by nebulization 3 (three) times daily. 03/05/19   Lang Snow, NP  magnesium oxide (MAG-OX) 400 MG tablet Take 400 mg by mouth 1 day or 1 dose.     [provider]  metoprolol (TOPROL-XL) 200 MG 24 hr tablet Take 200 mg by mouth daily.    [provider]  oxyCODONE (ROXICODONE) 5 MG immediate release tablet Take 1 tablet (5 mg total) by mouth every 8 (eight) hours as needed for severe pain. 02/19/20 02/18/21  Johnn Hai, PA-C  predniSONE (DELTASONE) 10 MG tablet Take 3 tablets once a day for 4 days 02/19/20   Johnn Hai, PA-C  tiotropium [facility]) 18 MCG inhalation capsule Place 18 mcg into inhaler and inhale daily.    [provider]  vitamin B-12 1000 MCG tablet Take 1 tablet (1,000 mcg total) by mouth daily. 03/05/19   Lang Snow, NP    Allergies Patient has no known allergies.  Family History  Problem Relation Age of Onset  . Hypertension Mother   . Stroke Father     Social History Social History   Tobacco Use  . Smoking status: Former Research scientist (life sciences)  . Smokeless tobacco: Never Used  Vaping Use  . Vaping Use: Never used  Substance Use Topics  . Alcohol use: No  . Drug use: No    Review of Systems Constitutional: No fever/chills Eyes: No visual changes. ENT: No sore throat. Cardiovascular: Denies chest pain. Respiratory: Denies shortness of breath. Gastrointestinal: No abdominal pain.  Positive for nausea, no vomiting.  No diarrhea.  No constipation. Genitourinary: Positive for polyuria.  Negative for  dysuria. Musculoskeletal: Negative for neck pain.  Negative for back pain. Integumentary: Negative for rash. Neurological: Negative for headaches, focal weakness or numbness.   ____________________________________________   PHYSICAL EXAM:  VITAL SIGNS: ED Triage Vitals  Enc Vitals Group     BP 03/29/20 2155 (!) 142/72     Pulse Rate 03/29/20 2155 (!) 59     Resp 03/29/20 2155 20     Temp 03/29/20 2155 98.9 F (37.2 C)     Temp Source 03/29/20 2155 Oral     SpO2 03/29/20 2155 94 %     Weight 03/29/20 2155 90.2 kg (198 lb 12.8 oz)     Height 03/29/20 2155 1.803 m (5\' 11" )     Head Circumference --      Peak Flow --      Pain Score 03/29/20 2314 0     Pain Loc --      Pain Edu? --      Excl. in [place]? --     Constitutional: Alert and oriented.  Eyes: Conjunctivae are normal.  Head: Atraumatic. Mouth/Throat: Patient is wearing a mask. Neck: No stridor.  No meningeal signs.   Cardiovascular: Normal rate, regular rhythm. Good peripheral circulation. Grossly normal heart sounds. Respiratory: Normal respiratory effort.  No retractions. Gastrointestinal: Soft and nontender. No distention.  Musculoskeletal: No lower extremity tenderness nor edema. No gross deformities of extremities. Neurologic:  Normal speech and language. No gross focal neurologic deficits are appreciated.  Skin:  Skin is warm, dry and intact. Psychiatric: Mood and affect are normal. Speech and behavior are normal.  ____________________________________________   LABS (all labs ordered are listed, but only abnormal results are displayed)  Labs Reviewed  BASIC METABOLIC PANEL - Abnormal; Notable for the following components:      Result Value   Sodium 122 (*)    Chloride 87 (*)    Glucose, Bld 1,060 (*)    BUN 49 (*)    Creatinine, Ser 2.40 (*)    Calcium 8.6 (*)    GFR calc non Af Amer 25 (*)    GFR calc Af Amer 29 (*)    All other components within normal limits  CBC - Abnormal; Notable for the  following components:   RBC 3.62 (*)    Hemoglobin 11.0 (*)    HCT 34.2 (*)    All other components within normal limits  URINALYSIS, COMPLETE (UACMP) WITH MICROSCOPIC - Abnormal; Notable for the following components:   Color, Urine STRAW (*)    APPearance CLEAR (*)    Glucose, UA >=500 (*)    Hgb urine dipstick LARGE (*)    Bacteria, UA RARE (*)    All other components within normal limits  URINALYSIS, ROUTINE W REFLEX MICROSCOPIC - Abnormal; Notable for the following components:   Color, Urine STRAW (*)    APPearance CLEAR (*)    Glucose, UA >=500 (*)    Hgb urine dipstick LARGE (*)    Bacteria, UA RARE (*)    All other components within normal limits  GLUCOSE, CAPILLARY - Abnormal; Notable for the following components:   Glucose-Capillary >600 (*)    All other components within normal limits  TROPONIN I (HIGH SENSITIVITY) - Abnormal; Notable for the following components:   Troponin I (High Sensitivity) 24 (*)    All other components within normal limits  URINE CULTURE  OSMOLALITY  BASIC METABOLIC PANEL  BASIC METABOLIC PANEL  BASIC METABOLIC PANEL  BASIC METABOLIC PANEL  BASIC METABOLIC PANEL  HEMOGLOBIN A1C  CBG MONITORING, ED  CBG MONITORING, ED   ____________________________________________  EKG  ED ECG REPORT I, [place] N Deana Krock, the attending physician, personally viewed and interpreted this ECG.   Date: 03/29/2020  EKG Time: 10:03 PM  Rate: 63  Rhythm: Atrial paced rhythm}  Axis: Leftward axis deviation  Intervals: Normal  ST&T Change: None     .Critical Care Performed by: Gregor Hams, MD Authorized by: Gregor Hams, MD   Critical care provider statement:    Critical care time (minutes):  30   Critical care time was exclusive of:  Separately billable procedures and treating other patients   Critical care was necessary to treat or prevent imminent or life-threatening deterioration of the following conditions:  Endocrine crisis    Critical care was time spent personally by me on the following activities:  Development of treatment plan with patient or surrogate, discussions with consultants, evaluation of patient's response to treatment, examination of patient, obtaining history from patient or surrogate, ordering and performing treatments and interventions, ordering and review of  laboratory studies, ordering and review of radiographic studies, pulse oximetry, re-evaluation of patient's condition and review of old charts     ____________________________________________   INITIAL IMPRESSION / MDM / [place] / ED COURSE  As part of my medical decision making, I reviewed the following data within the electronic MEDICAL RECORD NUMBER 74 year old male presented with above-stated history and physical exam a differential diagnosis including but not limited to new onset diabetes mellitus, DKA, hyperosmolar hyperglycemic state.  Lab data was revealed a glucose of 1060 with greater than 500 of glucose noted in the urine.  Normal saline infusion initiated however given patient's history of congestive heart failure this is being performed to closely.  In addition insulin infusion initiated.  Patient discussed with Dr. Damita Dunnings for hospital admission for further evaluation and management.  ____________________________________________  FINAL CLINICAL IMPRESSION(S) / ED DIAGNOSES  Final diagnoses:  Hyperosmolar hyperglycemic state (HHS) ([place])     MEDICATIONS GIVEN DURING THIS VISIT:  Medications  insulin regular, human (MYXREDLIN) 100 units/ 100 mL infusion (11.5 Units/hr Intravenous New Bag/Given 03/30/20 0024)  0.9 %  sodium chloride infusion ( Intravenous Not Given 03/30/20 0032)  dextrose 5 %-0.45 % sodium chloride infusion ( Intravenous Not Given 03/30/20 0032)  dextrose 50 % solution 0-50 mL (has no administration in time range)  apixaban (ELIQUIS) tablet 5 mg ( Oral Canceled Entry 03/30/20 0013)  ipratropium-albuterol  (DUONEB) 0.5-2.5 (3) MG/3ML nebulizer solution 3 mL (has no administration in time range)  0.9 %  sodium chloride infusion ( Intravenous New Bag/Given 03/30/20 0029)  dextrose 5 %-0.45 % sodium chloride infusion (has no administration in time range)  dextrose 50 % solution 0-50 mL (has no administration in time range)  potassium chloride 10 mEq in 100 mL IVPB (10 mEq Intravenous New Bag/Given 03/30/20 0017)  0.9 %  sodium chloride infusion ( Intravenous Stopped 03/30/20 0029)     ED Discharge Orders    None      *Please note:  MATTHEO SWINDLE was evaluated in Emergency Department on 03/30/2020 for the symptoms described in the history of present illness. He was evaluated in the context of the global COVID-19 pandemic, which necessitated consideration that the patient might be at risk for infection with the SARS-CoV-2 virus that causes COVID-19. Institutional protocols and algorithms that pertain to the evaluation of patients at risk for COVID-19 are in a state of rapid change based on information released by regulatory bodies including the CDC and federal and state organizations. These policies and algorithms were followed during the patient's care in the ED.  Some ED evaluations and interventions may be delayed as a result of limited staffing during and after the pandemic.*  Note:  This document was prepared using Dragon voice recognition software and may include unintentional dictation errors.   Gregor Hams, MD 03/30/20 [phone]

## 2020-03-29 NOTE — ED Notes (Signed)
RN attempted 20G to the right ac. Unable to get IV. Catheter intact, bleeding controlled. Pt on cardiac, bp and pulse ox monitoring

## 2020-03-29 NOTE — ED Triage Notes (Addendum)
Pt reports he has had diarrhea, nausea and reflux x1 week, worse after eating. Pt to ED due to increased weakness. Pt normally on 3L O2, no oxygen on in triage. Oxygen placed with nasal cannula.

## 2020-03-30 ENCOUNTER — Other Ambulatory Visit: Payer: Self-pay

## 2020-03-30 DIAGNOSIS — Z952 Presence of prosthetic heart valve: Secondary | ICD-10-CM | POA: Diagnosis not present

## 2020-03-30 DIAGNOSIS — Z7901 Long term (current) use of anticoagulants: Secondary | ICD-10-CM | POA: Diagnosis not present

## 2020-03-30 DIAGNOSIS — J9611 Chronic respiratory failure with hypoxia: Secondary | ICD-10-CM | POA: Diagnosis present

## 2020-03-30 DIAGNOSIS — E1165 Type 2 diabetes mellitus with hyperglycemia: Secondary | ICD-10-CM

## 2020-03-30 DIAGNOSIS — E1100 Type 2 diabetes mellitus with hyperosmolarity without nonketotic hyperglycemic-hyperosmolar coma (NKHHC): Secondary | ICD-10-CM | POA: Diagnosis present

## 2020-03-30 DIAGNOSIS — Z905 Acquired absence of kidney: Secondary | ICD-10-CM | POA: Diagnosis not present

## 2020-03-30 DIAGNOSIS — Z85528 Personal history of other malignant neoplasm of kidney: Secondary | ICD-10-CM | POA: Diagnosis not present

## 2020-03-30 DIAGNOSIS — R310 Gross hematuria: Secondary | ICD-10-CM | POA: Diagnosis present

## 2020-03-30 DIAGNOSIS — Z20822 Contact with and (suspected) exposure to covid-19: Secondary | ICD-10-CM | POA: Diagnosis present

## 2020-03-30 DIAGNOSIS — Z85038 Personal history of other malignant neoplasm of large intestine: Secondary | ICD-10-CM | POA: Diagnosis not present

## 2020-03-30 DIAGNOSIS — Z950 Presence of cardiac pacemaker: Secondary | ICD-10-CM | POA: Diagnosis not present

## 2020-03-30 DIAGNOSIS — Z8249 Family history of ischemic heart disease and other diseases of the circulatory system: Secondary | ICD-10-CM | POA: Diagnosis not present

## 2020-03-30 DIAGNOSIS — N1832 Chronic kidney disease, stage 3b: Secondary | ICD-10-CM | POA: Diagnosis present

## 2020-03-30 DIAGNOSIS — I482 Chronic atrial fibrillation, unspecified: Secondary | ICD-10-CM | POA: Diagnosis present

## 2020-03-30 DIAGNOSIS — I2510 Atherosclerotic heart disease of native coronary artery without angina pectoris: Secondary | ICD-10-CM | POA: Diagnosis present

## 2020-03-30 DIAGNOSIS — I428 Other cardiomyopathies: Secondary | ICD-10-CM | POA: Diagnosis present

## 2020-03-30 DIAGNOSIS — I5033 Acute on chronic diastolic (congestive) heart failure: Secondary | ICD-10-CM | POA: Diagnosis present

## 2020-03-30 DIAGNOSIS — E1122 Type 2 diabetes mellitus with diabetic chronic kidney disease: Secondary | ICD-10-CM | POA: Diagnosis present

## 2020-03-30 DIAGNOSIS — Z95810 Presence of automatic (implantable) cardiac defibrillator: Secondary | ICD-10-CM | POA: Diagnosis not present

## 2020-03-30 DIAGNOSIS — I130 Hypertensive heart and chronic kidney disease with heart failure and stage 1 through stage 4 chronic kidney disease, or unspecified chronic kidney disease: Secondary | ICD-10-CM | POA: Diagnosis present

## 2020-03-30 DIAGNOSIS — I10 Essential (primary) hypertension: Secondary | ICD-10-CM | POA: Diagnosis not present

## 2020-03-30 DIAGNOSIS — R519 Headache, unspecified: Secondary | ICD-10-CM | POA: Diagnosis present

## 2020-03-30 DIAGNOSIS — J449 Chronic obstructive pulmonary disease, unspecified: Secondary | ICD-10-CM | POA: Diagnosis present

## 2020-03-30 DIAGNOSIS — N179 Acute kidney failure, unspecified: Secondary | ICD-10-CM | POA: Diagnosis present

## 2020-03-30 DIAGNOSIS — Z9049 Acquired absence of other specified parts of digestive tract: Secondary | ICD-10-CM | POA: Diagnosis not present

## 2020-03-30 LAB — SARS CORONAVIRUS 2 BY RT PCR (HOSPITAL ORDER, PERFORMED IN ~~LOC~~ HOSPITAL LAB): SARS Coronavirus 2: NEGATIVE

## 2020-03-30 LAB — BASIC METABOLIC PANEL
Anion gap: 9 (ref 5–15)
Anion gap: 10 (ref 5–15)
Anion gap: 8 (ref 5–15)
Anion gap: 11 (ref 5–15)
Anion gap: 9 (ref 5–15)
BUN: 48 mg/dL — ABNORMAL HIGH (ref 8–23)
BUN: 48 mg/dL — ABNORMAL HIGH (ref 8–23)
BUN: 43 mg/dL — ABNORMAL HIGH (ref 8–23)
BUN: 45 mg/dL — ABNORMAL HIGH (ref 8–23)
BUN: 45 mg/dL — ABNORMAL HIGH (ref 8–23)
CO2: 26 mmol/L (ref 22–32)
CO2: 26 mmol/L (ref 22–32)
CO2: 25 mmol/L (ref 22–32)
CO2: 25 mmol/L (ref 22–32)
CO2: 26 mmol/L (ref 22–32)
Calcium: 8.4 mg/dL — ABNORMAL LOW (ref 8.9–10.3)
Calcium: 8.9 mg/dL (ref 8.9–10.3)
Calcium: 7.6 mg/dL — ABNORMAL LOW (ref 8.9–10.3)
Calcium: 8.8 mg/dL — ABNORMAL LOW (ref 8.9–10.3)
Calcium: 8.9 mg/dL (ref 8.9–10.3)
Chloride: 89 mmol/L — ABNORMAL LOW (ref 98–111)
Chloride: 96 mmol/L — ABNORMAL LOW (ref 98–111)
Chloride: 101 mmol/L (ref 98–111)
Chloride: 96 mmol/L — ABNORMAL LOW (ref 98–111)
Chloride: 98 mmol/L (ref 98–111)
Creatinine, Ser: 2.27 mg/dL — ABNORMAL HIGH (ref 0.61–1.24)
Creatinine, Ser: 2 mg/dL — ABNORMAL HIGH (ref 0.61–1.24)
Creatinine, Ser: 1.63 mg/dL — ABNORMAL HIGH (ref 0.61–1.24)
Creatinine, Ser: 1.83 mg/dL — ABNORMAL HIGH (ref 0.61–1.24)
Creatinine, Ser: 1.62 mg/dL — ABNORMAL HIGH (ref 0.61–1.24)
GFR calc Af Amer: 32 mL/min — ABNORMAL LOW (ref >60)
GFR calc Af Amer: 37 mL/min — ABNORMAL LOW (ref >60)
GFR calc Af Amer: 47 mL/min — ABNORMAL LOW (ref >60)
GFR calc Af Amer: 41 mL/min — ABNORMAL LOW (ref >60)
GFR calc Af Amer: 47 mL/min — ABNORMAL LOW (ref >60)
GFR calc non Af Amer: 27 mL/min — ABNORMAL LOW (ref >60)
GFR calc non Af Amer: 32 mL/min — ABNORMAL LOW (ref >60)
GFR calc non Af Amer: 41 mL/min — ABNORMAL LOW (ref >60)
GFR calc non Af Amer: 35 mL/min — ABNORMAL LOW (ref >60)
GFR calc non Af Amer: 41 mL/min — ABNORMAL LOW (ref >60)
Glucose, Bld: 1011 mg/dL (ref 70–99)
Glucose, Bld: 304 mg/dL — ABNORMAL HIGH (ref 70–99)
Glucose, Bld: 368 mg/dL — ABNORMAL HIGH (ref 70–99)
Glucose, Bld: 410 mg/dL — ABNORMAL HIGH (ref 70–99)
Glucose, Bld: 289 mg/dL — ABNORMAL HIGH (ref 70–99)
Potassium: 4.4 mmol/L (ref 3.5–5.1)
Potassium: 3.9 mmol/L (ref 3.5–5.1)
Potassium: 3.7 mmol/L (ref 3.5–5.1)
Potassium: 4.2 mmol/L (ref 3.5–5.1)
Potassium: 4.2 mmol/L (ref 3.5–5.1)
Sodium: 124 mmol/L — ABNORMAL LOW (ref 135–145)
Sodium: 132 mmol/L — ABNORMAL LOW (ref 135–145)
Sodium: 134 mmol/L — ABNORMAL LOW (ref 135–145)
Sodium: 132 mmol/L — ABNORMAL LOW (ref 135–145)
Sodium: 133 mmol/L — ABNORMAL LOW (ref 135–145)

## 2020-03-30 LAB — GLUCOSE, CAPILLARY
Glucose-Capillary: 138 mg/dL — ABNORMAL HIGH (ref 70–99)
Glucose-Capillary: 179 mg/dL — ABNORMAL HIGH (ref 70–99)
Glucose-Capillary: 195 mg/dL — ABNORMAL HIGH (ref 70–99)
Glucose-Capillary: 197 mg/dL — ABNORMAL HIGH (ref 70–99)
Glucose-Capillary: 280 mg/dL — ABNORMAL HIGH (ref 70–99)
Glucose-Capillary: 285 mg/dL — ABNORMAL HIGH (ref 70–99)
Glucose-Capillary: 355 mg/dL — ABNORMAL HIGH (ref 70–99)
Glucose-Capillary: 373 mg/dL — ABNORMAL HIGH (ref 70–99)
Glucose-Capillary: 376 mg/dL — ABNORMAL HIGH (ref 70–99)
Glucose-Capillary: 455 mg/dL — ABNORMAL HIGH (ref 70–99)
Glucose-Capillary: 564 mg/dL (ref 70–99)
Glucose-Capillary: >600 mg/dL (ref 70–99)
Glucose-Capillary: >600 mg/dL (ref 70–99)
Glucose-Capillary: >600 mg/dL (ref 70–99)

## 2020-03-30 LAB — OSMOLALITY: Osmolality: 327 mOsm/kg (ref 275–295)

## 2020-03-30 LAB — URINALYSIS, ROUTINE W REFLEX MICROSCOPIC
Bilirubin Urine: NEGATIVE
Glucose, UA: >=500 mg/dL — AB
Ketones, ur: NEGATIVE mg/dL
Leukocytes,Ua: NEGATIVE
Nitrite: NEGATIVE
Protein, ur: NEGATIVE mg/dL
Specific Gravity, Urine: 1.022 (ref 1.005–1.030)
pH: 6 (ref 5.0–8.0)

## 2020-03-30 MED ORDER — INSULIN GLARGINE 100 UNIT/ML ~~LOC~~ SOLN
10.0000 [IU] | Freq: Every day | SUBCUTANEOUS | Status: DC
  Administered 2020-03-30: 10 [IU] via SUBCUTANEOUS
  Filled 2020-03-30 (×2): qty 0.1

## 2020-03-30 MED ORDER — IPRATROPIUM-ALBUTEROL 0.5-2.5 (3) MG/3ML IN SOLN: 3.0000 mL | Freq: Four times a day (QID) | RESPIRATORY_TRACT | Status: DC | PRN

## 2020-03-30 MED ORDER — INSULIN REGULAR(HUMAN) IN NACL 100-0.9 UT/100ML-% IV SOLN: INTRAVENOUS | Status: DC

## 2020-03-30 MED ORDER — INSULIN ASPART 100 UNIT/ML ~~LOC~~ SOLN
0.0000 [IU] | Freq: Three times a day (TID) | SUBCUTANEOUS | Status: DC
  Administered 2020-03-30: 20 [IU] via SUBCUTANEOUS
  Administered 2020-03-30: 11 [IU] via SUBCUTANEOUS
  Administered 2020-03-30: 20 [IU] via SUBCUTANEOUS
  Administered 2020-03-30: 3 [IU] via SUBCUTANEOUS
  Administered 2020-03-31: 11 [IU] via SUBCUTANEOUS
  Administered 2020-03-31: 17:00:00 7 [IU] via SUBCUTANEOUS
  Administered 2020-03-31 – 2020-04-01 (×3): 3 [IU] via SUBCUTANEOUS
  Administered 2020-04-01: 08:00:00 7 [IU] via SUBCUTANEOUS
  Administered 2020-04-01 (×2): 11 [IU] via SUBCUTANEOUS
  Administered 2020-04-02 – 2020-04-03 (×3): 7 [IU] via SUBCUTANEOUS
  Administered 2020-04-03: 4 [IU] via SUBCUTANEOUS
  Administered 2020-04-04: 7 [IU] via SUBCUTANEOUS
  Administered 2020-04-04 – 2020-04-05 (×3): 15 [IU] via SUBCUTANEOUS
  Filled 2020-03-30 (×19): qty 1

## 2020-03-30 MED ORDER — SODIUM CHLORIDE 0.9 % IV SOLN: INTRAVENOUS | Status: DC

## 2020-03-30 MED ORDER — INSULIN GLARGINE 100 UNIT/ML ~~LOC~~ SOLN
20.0000 [IU] | Freq: Every day | SUBCUTANEOUS | Status: DC
  Filled 2020-03-30: qty 0.2

## 2020-03-30 MED ORDER — DEXTROSE-NACL 5-0.45 % IV SOLN: INTRAVENOUS | Status: DC

## 2020-03-30 MED ORDER — DEXTROSE 50 % IV SOLN: 0.0000 mL | INTRAVENOUS | Status: DC | PRN

## 2020-03-30 MED ORDER — LIVING WELL WITH DIABETES BOOK
Freq: Once | Status: DC
  Filled 2020-03-30: qty 1

## 2020-03-30 MED ORDER — INSULIN ASPART 100 UNIT/ML ~~LOC~~ SOLN
5.0000 [IU] | Freq: Three times a day (TID) | SUBCUTANEOUS | Status: DC
  Administered 2020-03-30: 5 [IU] via SUBCUTANEOUS
  Filled 2020-03-30: qty 1

## 2020-03-30 MED ORDER — APIXABAN 5 MG PO TABS
5.0000 mg | ORAL_TABLET | Freq: Two times a day (BID) | ORAL | Status: DC
  Administered 2020-03-30 – 2020-04-05 (×13): 5 mg via ORAL
  Filled 2020-03-30 (×14): qty 1

## 2020-03-30 MED ORDER — POTASSIUM CHLORIDE 10 MEQ/100ML IV SOLN
10.0000 meq | INTRAVENOUS | Status: AC
  Administered 2020-03-30 (×2): 10 meq via INTRAVENOUS
  Filled 2020-03-30 (×2): qty 100

## 2020-03-30 MED ORDER — CYCLOBENZAPRINE HCL 10 MG PO TABS
5.0000 mg | ORAL_TABLET | Freq: Three times a day (TID) | ORAL | Status: DC | PRN
  Administered 2020-03-30 – 2020-03-31 (×3): 5 mg via ORAL
  Filled 2020-03-30 (×3): qty 1

## 2020-03-30 NOTE — ED Notes (Signed)
Sharion Settler, NP notified: "hey  this gentlemans blood sugar has been less than 250 for the last 2 hours. Endotool asks that I notifiy you and see if we want to change anything. Endotoll just had me change insulin to 2units/hour and he has D5 0.45NS infusing at 79ml/hr"

## 2020-03-30 NOTE — H&P (Signed)
History and Physical    [name] OIN:[id number] DOB: [birth year] DOA: 03/29/2020  PCP: [facility] [employer]   Patient coming from: home  I have personally briefly reviewed patient's old medical records in [place]  Chief Complaint: Weakness and nausea x1 week  HPI: Darius Taylor is a 74 y.o. male with medical history significant for chronic A. fib on amiodarone and Eliquis, diastolic heart failure, COPD, CAD, status post TAVR, left nephrectomy secondary to renal cell carcinoma, colectomy secondary to colon cancer, who presents with a 1 week history of weakness, diarrhea and nausea and poor oral intake.  He denies abdominal pain, fever or chills, denies cough or shortness of breath.  Denies chest pain.  He endorses polydipsia and polyuria ED Course: On arrival, his vitals were within normal limits.  Blood work significant for blood glucose of 1060 with anion gap of 10, creatinine 2.4, sodium 122.  Urinalysis unremarkable.  Hemoglobin 11.  Troponin slightly elevated at 24.  EKG showed atrial paced rhythm.  Patient started on an insulin infusion.  Hospitalist consulted for admission.  Review of Systems: As per HPI otherwise all other systems on review of systems negative.    Past Medical History:  Diagnosis Date  . Aortic stenosis   . Arrhythmia    atrial fibrillation  . Asthma   . Atrial fibrillation ([place])   . CAD (coronary artery disease)   . Cancer [facility])    colon, bladder and kidney  . CHF (congestive heart failure) ([place])   . 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 COPD (chronic obstructive pulmonary disease) ([place])   . H/O ventricular tachycardia   . Hypertension   . Nonischemic cardiomyopathy ([place])    Status post AICD and pacemaker  . Renal cell carcinoma [facility])     Past Surgical History:  Procedure Laterality Date  . CARDIAC DEFIBRILLATOR PLACEMENT    . CARDIAC DEFIBRILLATOR PLACEMENT    . COLONOSCOPY WITH PROPOFOL N/A 06/10/2018   Procedure: COLONOSCOPY WITH  PROPOFOL;  Surgeon: Lin Landsman, MD;  Location: [facility] ENDOSCOPY;  Service: Gastroenterology;  Laterality: N/A;  . CORONARY ANGIOPLASTY  09/25/2016  . IRRIGATION AND DEBRIDEMENT HEMATOMA Left 10/24/2016   Procedure: IRRIGATION AND DEBRIDEMENT HEMATOMA;  Surgeon: Florene Glen, MD;  Location: [facility] ORS;  Service: General;  Laterality: Left;  . KIDNEY SURGERY     left ne[hrectomy for RCC  . PACEMAKER IMPLANT    . PARTIAL COLECTOMY       reports that he has quit smoking. He has never used smokeless tobacco. He reports that he does not drink alcohol and does not use drugs.  No Known Allergies  Family History  Problem Relation Age of Onset  . Hypertension Mother   . Stroke Father       Prior to Admission medications   Medication Sig Start Date End Date Taking? Authorizing Provider  acetaminophen (TYLENOL) 325 MG tablet Take 650 mg by mouth every 6 (six) hours as needed for mild pain or fever.     [provider]  amiodarone (PACERONE) 200 MG tablet Take 200 mg by mouth daily.    [provider]  apixaban (ELIQUIS) 5 MG TABS tablet Take 5 mg by mouth 2 (two) times daily.    [provider]  cholecalciferol (VITAMIN D) 1000 units tablet Take 1,000 Units by mouth daily.    [provider]  colchicine 0.6 MG tablet Take 0.6 mg by mouth daily.    [provider]  ferrous gluconate (FERGON) 324 MG tablet Take 324 mg by mouth daily with breakfast.    [provider]  furosemide (LASIX) 80 MG tablet Take 80 mg by mouth 2 (two) times daily.    [provider]  ipratropium-albuterol (DUONEB) 0.5-2.5 (3) MG/3ML SOLN Take 3 mLs by nebulization 3 (three) times daily. 03/05/19   Lang Snow, NP  magnesium oxide (MAG-OX) 400 MG tablet Take 400 mg by mouth 1 day or 1 dose.     [provider]  metoprolol (TOPROL-XL) 200 MG 24 hr tablet Take 200 mg by mouth daily.    [provider]  oxyCODONE (ROXICODONE) 5  MG immediate release tablet Take 1 tablet (5 mg total) by mouth every 8 (eight) hours as needed for severe pain. 02/19/20 02/18/21  Johnn Hai, PA-C  predniSONE (DELTASONE) 10 MG tablet Take 3 tablets once a day for 4 days 02/19/20   Johnn Hai, PA-C  tiotropium [facility]) 18 MCG inhalation capsule Place 18 mcg into inhaler and inhale daily.    [provider]  vitamin B-12 1000 MCG tablet Take 1 tablet (1,000 mcg total) by mouth daily. 03/05/19   Lang Snow, NP    Physical Exam: Vitals:   03/29/20 2155 03/29/20 2330  BP: (!) 142/72 (!) 154/36  Pulse: (!) 59 (!) 58  Resp: 20 (!) 21  Temp: 98.9 F (37.2 C)   TempSrc: Oral   SpO2: 94% 100%  Weight: 90.2 kg   Height: 5\' 11"  (1.803 m)      Vitals:   03/29/20 2155 03/29/20 2330  BP: (!) 142/72 (!) 154/36  Pulse: (!) 59 (!) 58  Resp: 20 (!) 21  Temp: 98.9 F (37.2 C)   TempSrc: Oral   SpO2: 94% 100%  Weight: 90.2 kg   Height: 5\' 11"  (1.803 m)       Constitutional:  Appears weak, oriented x 3 . Not in acute distress HEENT:      Head: Normocephalic and atraumatic.         Eyes: PERLA, EOMI, Conjunctivae are normal. Sclera is non-icteric.       Mouth/Throat: Mucous membranes are moist.       Neck: Supple with no signs of meningismus. Cardiovascular:  Irregular. No murmurs, gallops, or rubs. 2+ symmetrical distal pulses are present . No JVD. No 1+ LE edema Respiratory: Respiratory effort slightly increased.Lungs sounds clear bilaterally. No wheezes, crackles, or rhonchi.  Gastrointestinal: Soft, non tender, and non distended with positive bowel sounds. No rebound or guarding. Genitourinary: No CVA tenderness. Musculoskeletal: Nontender with normal range of motion in all extremities. No cyanosis, or erythema of extremities. Neurologic: Normal speech and language. Face is symmetric. Moving all extremities. No gross focal neurologic deficits . Skin: Skin is warm, dry.  No rash or ulcers Psychiatric:  Mood and affect are normal Speech and behavior are normal   Labs on Admission: I have personally reviewed following labs and imaging studies  CBC: Recent Labs  Lab 03/29/20 2201  WBC 7.2  HGB 11.0*  HCT 34.2*  MCV 94.5  PLT 161   Basic Metabolic Panel: Recent Labs  Lab 03/29/20 2201  NA 122*  K 4.4  CL 87*  CO2 25  GLUCOSE 1,060*  BUN 49*  CREATININE 2.40*  CALCIUM 8.6*   GFR: Estimated Creatinine Clearance: 28.3 mL/min (A) (by C-G formula based on SCr of 2.4 mg/dL (H)). Liver Function Tests: No results for input(s): AST, ALT, ALKPHOS, BILITOT, PROT, ALBUMIN in the  last 168 hours. No results for input(s): LIPASE, AMYLASE in the last 168 hours. No results for input(s): AMMONIA in the last 168 hours. Coagulation Profile: No results for input(s): INR, PROTIME in the last 168 hours. Cardiac Enzymes: No results for input(s): CKTOTAL, CKMB, CKMBINDEX, TROPONINI in the last 168 hours. BNP (last 3 results) No results for input(s): PROBNP in the last 8760 hours. HbA1C: No results for input(s): HGBA1C in the last 72 hours. CBG: No results for input(s): GLUCAP in the last 168 hours. Lipid Profile: No results for input(s): CHOL, HDL, LDLCALC, TRIG, CHOLHDL, LDLDIRECT in the last 72 hours. Thyroid Function Tests: No results for input(s): TSH, T4TOTAL, FREET4, T3FREE, THYROIDAB in the last 72 hours. Anemia Panel: No results for input(s): VITAMINB12, FOLATE, FERRITIN, TIBC, IRON, RETICCTPCT in the last 72 hours. Urine analysis:    Component Value Date/Time   COLORURINE STRAW (A) 03/29/2020 2201   APPEARANCEUR CLEAR (A) 03/29/2020 2201   APPEARANCEUR Clear 01/19/2013 1957   LABSPEC 1.022 03/29/2020 2201   LABSPEC 1.025 01/19/2013 1957   PHURINE 5.0 03/29/2020 2201   GLUCOSEU >=500 (A) 03/29/2020 2201   GLUCOSEU Negative 01/19/2013 1957   HGBUR LARGE (A) 03/29/2020 2201   BILIRUBINUR NEGATIVE 03/29/2020 2201   BILIRUBINUR Negative 01/19/2013 1957   KETONESUR NEGATIVE  03/29/2020 2201   PROTEINUR NEGATIVE 03/29/2020 2201   NITRITE NEGATIVE 03/29/2020 2201   LEUKOCYTESUR NEGATIVE 03/29/2020 2201   LEUKOCYTESUR 2+ 01/19/2013 1957    Radiological Exams on Admission: No results found.  EKG: Independently reviewed. Interpretation : A. fib with controlled rate  Assessment/Plan 74 year old male with multiple medical conditions presenting with HHS with new onset diabetes    Hyperosmolar hyperglycemic state (HHS) ([place])   New onset type 2 diabetes mellitus ([place]) -Continue insulin infusion and IV hydration as well as electrolyte correction per protocol as ordered -Follow A1c.  A1c was 6.2 in February 2020 -We will need diabetic education    Chronic respiratory failure with hypoxia (HCC) -No acute concerns.  Continue home O2 via nasal cannula    Chronic diastolic heart failure ([place]) -Not acutely exacerbated -Monitor for fluid overload given IV hydration and treatment of HHS    HTN (hypertension) -Continue home meds pending med rec    CAD (coronary artery disease) -No complaints of chest pain and EKG nonacute -Continue home meds pending med rec    COPD with chronic bronchitis ([place]) -Not acutely exacerbated.  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 -Baseline 1.89 per Care Everywhere.  Seen by nephrology at [facility]  Chronic atrial fibrillation with history of ablation -Continue amiodarone and Eliquis pending med rec  History of colectomy secondary to colon cancer -No acute issues    DVT prophylaxis: Eliquis Code Status: full code  Family Communication:  none  Disposition Plan: Back to previous home environment Consults called: none  Status:At the time of admission, it appears that the appropriate admission status for this patient is INPATIENT. This is judged to be reasonable and necessary in order to  provide the required intensity of service to ensure the patient's safety given the presenting symptoms, physical exam findings, and initial radiographic and laboratory data in the context of their  Comorbid conditions.   Patient requires inpatient status due to high intensity of service, high risk for further deterioration and high frequency of surveillance required.   I certify that at the point of  admission it is my clinical judgment that the patient will require inpatient hospital care spanning beyond [place] MD [facility]     03/30/2020, 12:05 AM

## 2020-03-30 NOTE — ED Notes (Signed)
Date and time results received: 03/30/20 0128 (use smartphrase ".now" to insert current time)  Test: blood glucose  Critical Value: 1011  Name of Provider Notified: Sharion Settler, NP  Orders Received? Or Actions Taken?: Actions Taken: provider notified

## 2020-03-30 NOTE — ED Notes (Signed)
Daughter called at [phone] with pt update at pt request

## 2020-03-30 NOTE — ED Notes (Signed)
Hourly rounding reveals patient sleeping in room. No complaints, stable, in no acute distress.

## 2020-03-30 NOTE — ED Notes (Signed)
Dr. Damita Dunnings notified of BP of 121/108

## 2020-03-30 NOTE — ED Notes (Signed)
Patient observed lying in bed with eyes closed  Even, unlabored respirations observed  NAD pt appears to be sleeping.

## 2020-03-30 NOTE — ED Notes (Signed)
Diabetes coordinator at bedside. 

## 2020-03-30 NOTE — Progress Notes (Signed)
Darius Taylor is a 74 y.o. male with a history of atrial fibrillation on eliquis and amiodarone, HFpE, COPD, CAD, TAVR, left nephrectomy secondary to [place], colectomy secondary to colon cancer who presented with 1 week history of weakness, diarrhea and nausea with poor oral intake and who was admitted early this morning by Dr. Damita Dunnings for HHS, newly diagnosed Diabetes and placed on an insulin drip. He has since been transitioned to subcutaneous insulin.   At bedside patient noted to have an episode of hematuria, new from admission but admitted that he has upcoming Urology appointment.   A/P  1. HHS/Newly diagnosed DM a. Continue subcutaneous insulin b. Sliding scale and meal coverage added c. Diabetic coordinator on board d. Follow up HA1c e. Needs better glucose coverage prior to discharge 2. Gross hematuria a. Discussed with urology, can continue eliquis. No need for intervention unless he has worsening bleeding and/or clots.  b. Will observe overnight c. Follow up with urology at [facility] 3. HFpEF, stable 4. Chronic respiratory failure with hypoxia, stable 5. Atrial fibrillation, rate controlled a. Continue eliquis unless further urologic issue, per Urology 6. HTN, continue home meds 7. CAD, stable 8. COPD, stable 9. AKI on CKD 3b, improving 10. History of colon cancer s/p colectomy    Harold Hedge, DO [facility] Pager [phone]

## 2020-03-30 NOTE — ED Notes (Signed)
Pt states his fingers/hands are cramping, but denies pain. Sharion Settler, notified.

## 2020-03-30 NOTE — ED Notes (Addendum)
Documentation Error Correction. "03:33 Insulin regular, human (MYXREDLIN) 100units/139mL infusion stopped" was never stopped. Potassium had finished and RN ment to stop and calculate that fluid intake, however the insulin infusion was selected by mistake. Pt still has insulin infusing going at 5units/hour. Pt is resting in bed. Insulin infusion was never stopped and the infusion bag has not finished.

## 2020-03-30 NOTE — ED Notes (Signed)
Per Dr. Neysa Bonito  give the lantus and then stop the insulin drip 1-2 hours after giving the lantus and stop the dextrose as well you can stop the dextrose when you give the lantus and stop the drip 1-2 hours later, sorry for the confusion

## 2020-03-30 NOTE — ED Notes (Signed)
As per Darius Taylor:  after you give the long acting insulin you can stop the dextrose fluids and insulin in one hour. then start normal saline at 75  Day shift RN to be notified at shift change

## 2020-03-30 NOTE — ED Notes (Signed)
As per ER provider. NS at 172mL/hr stopped and switched to 67mL/hr.  Potassium infusion rate changed to 46mL/hr. Dr. Sharlene Motts notified and stated that that was okay. Rate changed due to burning sensation.

## 2020-03-30 NOTE — ED Notes (Signed)
Pt cleansed of urine and external catheter applied. Pt given warm blanket and lights turned off.

## 2020-03-30 NOTE — ED Notes (Signed)
NO IV fluid bolus given, as per ER attending, due to pts medical history

## 2020-03-30 NOTE — Progress Notes (Signed)
Inpatient Diabetes Program Recommendations  AACE/ADA: New Consensus Statement on Inpatient Glycemic Control   Target Ranges:  Prepandial:   less than 140 mg/dL      Peak postprandial:   less than 180 mg/dL (1-2 hours)      Critically ill patients:  140 - 180 mg/dL   Results for [name], [name] (MRN [id number]) as of 03/30/2020 13:10  Ref. Range 03/30/2020 00:18 03/30/2020 01:00 03/30/2020 01:34 03/30/2020 02:08 03/30/2020 02:41 03/30/2020 03:10 03/30/2020 04:15 03/30/2020 05:19 03/30/2020 06:22 03/30/2020 08:15 03/30/2020 10:10 03/30/2020 12:41  Glucose-Capillary Latest Ref Range: 70 - 99 mg/dL >600 (HH) >600 (HH) >600 (HH) 564 (HH) 455 (H) 376 (H) 285 (H) 197 (H) 179 (H) 195 (H)     Lantus 10 units '@8'$ :22 138 (H)  Novolog 3 units 355 (H)  Novolog 20 units  Results for [name], [name] (MRN [id number]) as of 03/30/2020 13:10  Ref. Range 03/29/2020 22:01  Glucose Latest Ref Range: 70 - 99 mg/dL 1,060 [facility])   Results for [name], [name] (MRN [id number]) as of 03/30/2020 13:10  Ref. Range 11/03/2018 22:12  Hemoglobin A1C Latest Ref Range: 4.8 - 5.6 % 6.2 (H)   Review of Glycemic Control  Diabetes history: No Outpatient Diabetes medications: NA Current orders for Inpatient glycemic control: Lantus 10 units daly, Novolog 0-20 units AC&HS  Inpatient Diabetes Program Recommendations:    Insulin-Meal Coverage: Please consider ordering Novolog 5 units TID with meals for meal coverage.  Insulin-Basal: If meal coverage insulin is added as recommended and glucose remains elevated at bedtime today, may want to consider increasing Lantus to 10 units BID (to start at bedtime today).  HbgA1C: Current A1C in process. Prior A1C in chart was 6.2% on 11/03/18.  NOTE: Spoke with patient about new diabetes diagnosis.  Patient states that he has no prior DM hx and that he goes to [facility] for primary care. Patient denies taking any steroids or receiving any steroids IV/IM in the past 3 months. Patient states that he has not started  any new medications within the past 3 months. Patient states over the past week he has been weak, had blurry vision, polydipsia, and polyuria.  Patient reports that his wife had DM and took insulin. However, he states he never helped her with any DM management. Informed patient that a current A1C has been ordered but not resulted yet. Explained what an A1C is and informed patient that his prior A1C in the chart was 6.2% on 03/04/19. Per ADA, if A1C 5.7-6.4% then criteria met for prediabetes. Patient states he was never made aware of any A1C values. Discussed basic pathophysiology of DM Type 2, basic home care, importance of checking CBGs and maintaining good CBG control to prevent long-term and short-term complications. Reviewed glucose and A1C goals. Reviewed signs and symptoms of hyperglycemia and hypoglycemia along with treatment for both. Discussed impact of nutrition, exercise, stress, sickness, and medications on diabetes control. Discussed Carb Modified diet and informed patient that RD has been consulted and should be talking with him further about Carb Modified diet. Patient notes that he drinks water, juice, sweet tea, lemonade, and Kool Aid.  Encouraged patient to eliminate sugary beverages.  Patient states that he is not physically able to exercise and he has been working with home PT 2 times a week but he has completed therapy with PT.   Reviewed Living Well with diabetes booklet and encouraged patient to read through entire book. Discussed initial glucose of 1060 mg/dl and transition from  IV to SQ insulin.  Patient states that he does not want to take insulin at home as he would prefer oral DM medications. Explained that depending on how glucose response and how high his A1C is, will likely determine if he will need insulin as an outpatient.  Patient stated he hoped it wasn't bad because he really does not want to take insulin injections at home. Informed patient that it would be requested that RNs work  with him on insulin injections just in case he needed to be discharged on insulin. Informed patient that RNs will be asking him to self-administer insulin to ensure proper technique and ability to administer self insulin shots (in case discharged on insulin). Informed patient that diabetes coordinator will follow up with him again tomorrow to review information discussed today and talk more about insulin if needed.   Patient verbalized understanding of information discussed and he states that he has no further questions at this time related to diabetes.   RNs to provide ongoing basic DM education at bedside with this patient and engage patient to actively check blood glucose and administer insulin injections.   Thanks, Barnie Alderman, RN, MSN, CDE Diabetes Coordinator Inpatient Diabetes Program [phone] (Team Pager from 8am to 5pm)

## 2020-03-30 NOTE — ED Notes (Signed)
Date and time results received: 03/30/20 0159 (use smartphrase ".now" to insert current time)  Test: Serum Osmolality  Critical Value: 327  Name of Provider Notified: Sharion Settler, NP notified   Orders Received? Or Actions Taken?: Actions Taken: Provider notified

## 2020-03-31 LAB — HEMOGLOBIN A1C
Hgb A1c MFr Bld: 12.2 % — ABNORMAL HIGH (ref 4.8–5.6)
Mean Plasma Glucose: 303 mg/dL

## 2020-03-31 LAB — GLUCOSE, CAPILLARY
Glucose-Capillary: 137 mg/dL — ABNORMAL HIGH (ref 70–99)
Glucose-Capillary: 253 mg/dL — ABNORMAL HIGH (ref 70–99)
Glucose-Capillary: 214 mg/dL — ABNORMAL HIGH (ref 70–99)
Glucose-Capillary: 130 mg/dL — ABNORMAL HIGH (ref 70–99)

## 2020-03-31 MED ORDER — INSULIN STARTER KIT- PEN NEEDLES (ENGLISH)
1.0000 | Freq: Once | Status: AC
  Administered 2020-03-31: 17:00:00 1
  Filled 2020-03-31: qty 1

## 2020-03-31 MED ORDER — INSULIN GLARGINE 100 UNIT/ML ~~LOC~~ SOLN
25.0000 [IU] | Freq: Two times a day (BID) | SUBCUTANEOUS | Status: DC
  Filled 2020-03-31 (×2): qty 0.25

## 2020-03-31 MED ORDER — INSULIN ASPART 100 UNIT/ML ~~LOC~~ SOLN
10.0000 [IU] | Freq: Three times a day (TID) | SUBCUTANEOUS | Status: DC
  Administered 2020-03-31 – 2020-04-01 (×4): 10 [IU] via SUBCUTANEOUS
  Filled 2020-03-31 (×4): qty 1

## 2020-03-31 MED ORDER — ACETAMINOPHEN 325 MG PO TABS
650.0000 mg | ORAL_TABLET | Freq: Four times a day (QID) | ORAL | Status: DC | PRN
  Administered 2020-03-31 – 2020-04-04 (×3): 650 mg via ORAL
  Filled 2020-03-31 (×3): qty 2

## 2020-03-31 MED ORDER — INSULIN GLARGINE 100 UNIT/ML ~~LOC~~ SOLN
10.0000 [IU] | Freq: Two times a day (BID) | SUBCUTANEOUS | Status: DC
  Filled 2020-03-31 (×2): qty 0.1

## 2020-03-31 MED ORDER — INSULIN GLARGINE 100 UNIT/ML ~~LOC~~ SOLN
25.0000 [IU] | Freq: Every day | SUBCUTANEOUS | Status: DC
  Administered 2020-03-31 – 2020-04-01 (×2): 25 [IU] via SUBCUTANEOUS
  Filled 2020-03-31 (×2): qty 0.25

## 2020-03-31 NOTE — Progress Notes (Addendum)
Inpatient Diabetes Program Recommendations  AACE/ADA: New Consensus Statement on Inpatient Glycemic Control   Target Ranges:  Prepandial:   less than 140 mg/dL      Peak postprandial:   less than 180 mg/dL (1-2 hours)      Critically ill patients:  140 - 180 mg/dL   Results for Darius Taylor, Darius Taylor (MRN [id number]) as of 03/31/2020 07:37  Ref. Range 03/30/2020 08:15 03/30/2020 10:10 03/30/2020 12:41 03/30/2020 17:10 03/30/2020 22:21  Glucose-Capillary Latest Ref Range: 70 - 99 mg/dL 195 (H)    Lantus 10 units 138 (H)  Novolog 3 units 355 (H)  Novolog 20 units 373 (H)  Novolog 25 units 280 (H)  Novolog 11 units  Results for Darius Taylor, Darius Taylor (MRN [id number]) as of 03/31/2020 07:37  Ref. Range 11/03/2018 22:12 03/30/2020 00:04  Hemoglobin A1C Latest Ref Range: 4.8 - 5.6 % 6.2 (H) 12.2 (H)   Review of Glycemic Control  Diabetes history: No Outpatient Diabetes medications: NA Current orders for Inpatient glycemic control: Lantus 10 units BID, Novolog 0-20 units AC&HS, Novolog 5 units TID with meals  Inpatient Diabetes Program Recommendations:    Insulin-Basal: Noted Lantus increased to 10 units BID today. Please consider changing Lantus to 25 units daily (so patient receives this morning).  Insulin-Meal Coverage: Please consider increasing meal coverage to Novolog 10 units TID with meals if patient eats at least 50% of meals.  HbgA1C: A1C 12.2% on 03/30/20 indicating an average glucose of 303 mg/dl over the past 2-3 months.  NOTE: Spoke with patient again regarding DM and reviewed information discussed yesterday. Informed patient that A1C is 12.2% on 03/30/20 indicating an average glucose of 303 mg/dl. Explained that based on his glucose trends and with his A1C results, he will likely need to be discharged home with insulin. Patient notes that he would prefer oral DM medications but if he needs to take insulin he will take it but would like a simple regimen. Patient notes that he lives alone and does not  really have much support so he will need to take care of his DM himself. Discussed Lantus and Novolog insulin and explained that at this point, MD has not indicated if he will be discharged on both types of insulin. Patient notes that he does not think he could check glucose 4 times a day and take 4 shots a day. Informed patient that it would be noted that he is willing to take insulin but wants a simple regimen. Explained that MD may prescribe oral DM meds in addition to insulin at time of discharge. Discussed vials of insulin versus insulin pens and patient states that he feels insulin pens would be safer and easier for him.  Informed patient that an insulin pen starter kit has been ordered and he should receive from RN once pharmacy delivers to unit. Encouraged patient to review information and also asked that he have his pharmacy review insulin pen with him again when he gets it filled if MD prescribes at discharge.  Educated patient on insulin pen use at home.  Reviewed all steps of insulin pen including attachment of needle, 2-unit air shot, dialing up dose, giving injection, removing needle, disposal of sharps, storage of unused insulin, disposal of insulin etc. Patient able to provide successful return demonstration with some verbal cues. Will need RNs to continue working with him on insulin injections and patient would benefit from reviewing insulin pens again. Informed patient that RNs have been asked to allow him to self administer insulin  while inpatient to ensure he is using proper technique for insulin injections and able to self inject insulin.  Again reminded patient to ask his pharmacy to review insulin pens again when he fills if prescribed at discharge. Patient inquired about having a nurse come out to his home after discharge to be sure he is doing everything correctly. Will ask TOC to see if patient can get RN to come out to home after discharge.  Patient verbalized understanding of information  discussed and states that he has no questions at this time related to DM or insulin.   After discussion with patient, would recommend a simplified regimen for DM management. MD may want to prescribe basal insulin along with oral DM medication(s).   MD to provide patient Rxs for glucose monitoring kit (#[id number]), Lantus SoloStar insulin pens (#[id number]) and insulin pen needles (#[id number]).  Thanks, Barnie Alderman, RN, MSN, CDE Diabetes Coordinator Inpatient Diabetes Program [phone] (Team Pager from 8am to 5pm)

## 2020-03-31 NOTE — Progress Notes (Signed)
PROGRESS NOTE    Darius Taylor    Code Status: Full Code  JSH:[id number] DOB: Jan 26, 1946 DOA: 03/29/2020 LOS: 1 days  PCP: [facility], [employer] CC:  Chief Complaint  Patient presents with  . Weakness       Hospital Summary   Darius Taylor is a 74 y.o. male with a history of atrial fibrillation on eliquis and amiodarone, HFpE, COPD, CAD, TAVR, left nephrectomy secondary to [place], colectomy secondary to colon cancer who presented with 1 week history of weakness, diarrhea and nausea with poor oral intake and who was admitted for HHS and newly diagnosed Diabetes and placed on an insulin drip. He has since been transitioned to subcutaneous insulin but continues to have generalized weakness and hyperglycemia. Diabetic coordinator has been consulted as well as PT.   Patient was also noted to have gross hematuria in the ED which was discussed with Urology who did not recommend any intervention at this time unless decompensation and to continue Eliquis per his Urologist recommendations in Care Everywhere for history of clots.     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 HHS/Newly diagnosed DM a. Increase Lantus to 25 u daily and Novolog 10 u TID with meals b. HA1c 12.2  c. Patient does not feel comfortable starting insulin at discharge as he lives alone and does not feel he can use the insulin properly.  d. Diabetic coordinator on board e. TOC consult to assist with needs  at discharge  2. Generalized weakness, likely from debility and hyperglycemia a. PT eval  3. Gross hematuria a. Discussed with urology, can continue eliquis. No need for intervention unless he has worsening bleeding and/or clots.  b. Follow up with urology at [facility]  4. HFpEF, stable  5. Chronic respiratory failure with hypoxia, stable  6. Atrial fibrillation, rate controlled a. Continue eliquis unless further urologic issue, per Urology  7. HTN, continue home meds  8. CAD, stable  9. COPD, stable  10. AKI on CKD 3b,  1. At/near baseline  11. Headache 1. No redflags, likely tension 2. Tylenol  12. History of colon cancer s/p colectomy   DVT prophylaxis:  apixaban (ELIQUIS) tablet 5 mg   Family Communication: no family at bedside   Disposition Plan:  Status is: Inpatient  Remains inpatient appropriate because:Unsafe d/c plan   Dispo: The patient is from: Home              Anticipated d/c is to: TBD              Anticipated d/c date is: 1 day              Patient currently is not medically stable to d/c.          Pressure injury documentation    None  Consultants  None  Procedures  None  Antibiotics   Anti-infectives (From admission, onward)   None        Subjective   Complaining of  a headache as well as persistent generalized weakness. He is concerned about going home with insulin as he said that he lives alone and does not feel comfortable with taking insulin by himself and would prefer oral agents. Otherwise, denies any other issues and no other issues overnight.   Objective   Vitals:   03/31/20 0011 03/31/20 0540 03/31/20 0925 03/31/20 1205  BP: 122/70 122/73 110/72 (!) 168/41  Pulse: 64 66 65 66  Resp: 17 20 (!) 24 (!) 24  Temp: 98.4 F (36.9 C) 98.4 F (36.9 C) 98 F (36.7 C) 97.8 F (36.6 C)  TempSrc: Oral Oral Oral Oral  SpO2: 94% 98% 93% 99%  Weight:      Height:        Intake/Output Summary (Last 24 hours) at  03/31/2020 1333 Last data filed at 03/31/2020 0925 Gross per 24 hour  Intake 2180.49 ml  Output 1150 ml  Net 1030.49 ml   Filed Weights   03/29/20 2155  Weight: 90.2 kg    Examination:  Physical Exam Vitals and nursing note reviewed.  Constitutional:      General: He is not in acute distress. HENT:     Head: Normocephalic.     Mouth/Throat:     Mouth: Mucous membranes are moist.  Eyes:     Conjunctiva/sclera: Conjunctivae normal.  Cardiovascular:     Rate and Rhythm: Normal rate and regular rhythm.  Pulmonary:     Effort: Pulmonary effort is normal.     Breath sounds: Normal breath sounds.  Musculoskeletal:        General: No swelling or tenderness.  Neurological:     Mental Status: He is alert. Mental status is at baseline.  Psychiatric:        Mood and Affect: Mood normal.        Behavior: Behavior normal.     Data Reviewed: I have personally reviewed following labs and imaging studies  CBC: Recent Labs  Lab 03/29/20 2201  WBC 7.2  HGB 11.0*  HCT 34.2*  MCV 94.5  PLT 964   Basic Metabolic Panel: Recent Labs  Lab 03/30/20 0004 03/30/20 0400 03/30/20 1239 03/30/20 1433 03/30/20 2229  NA 124* 132* 134* 132* 133*  K 4.4 3.9 3.7 4.2 4.2  CL 89* 96* 101 96* 98  CO2 _0 GLUCOSE 1,011* 304* 368* 410* 289*  BUN 48* 48* 43* 45* 45*  CREATININE 2.27* 2.00* 1.63* 1.83* 1.62*  CALCIUM 8.4* 8.9 7.6* 8.8* 8.9   GFR: Estimated Creatinine Clearance: 42 mL/min (A) (by C-G formula based on SCr of 1.62 mg/dL (H)). Liver Function Tests: No results for input(s): AST, ALT, ALKPHOS, BILITOT, PROT, ALBUMIN in the last 168 hours. No results for input(s): LIPASE, AMYLASE in the last 168 hours. No results for input(s): AMMONIA in the last 168 hours. Coagulation Profile: No results for input(s): INR, PROTIME in the last 168 hours. Cardiac Enzymes: No results for input(s): CKTOTAL, CKMB, CKMBINDEX, TROPONINI in the last 168 hours. BNP (last 3 results) No  results for input(s): PROBNP in the last 8760 hours. HbA1C: Recent Labs    03/30/20 0004  HGBA1C 12.2*   CBG: Recent Labs  Lab 03/30/20 1241 03/30/20 1710 03/30/20 2221 03/31/20 0838 03/31/20 1134  GLUCAP 355* 373* 280* 137* 253*   Lipid Profile: No results for input(s): CHOL, HDL, LDLCALC, TRIG, CHOLHDL, LDLDIRECT in the last 72 hours. Thyroid Function Tests: No results for input(s): TSH, T4TOTAL, FREET4, T3FREE, THYROIDAB in the last  72 hours. Anemia Panel: No results for input(s): VITAMINB12, FOLATE, FERRITIN, TIBC, IRON, RETICCTPCT in the last 72 hours. Sepsis Labs: No results for input(s): PROCALCITON, LATICACIDVEN in the last 168 hours.  Recent Results (from the past 240 hour(s))  Urine culture     Status: Abnormal (Preliminary result)   Collection Time: 03/29/20 11:37 PM   Specimen: Urine, Clean Catch  Result Value Ref Range Status   Specimen Description   Final    URINE, CLEAN CATCH Performed at [facility], [address] [place], [place] [address]    Special Requests   Final    NONE Performed at [facility], [place], [place] [address]    Culture (A)  Final    50,000 COLONIES/mL ENTEROCOCCUS FAECALIS SUSCEPTIBILITIES TO FOLLOW Performed at [facility], [place] [address], [place], [place] [address]    Report Status PENDING  Incomplete  SARS Coronavirus 2 by RT PCR (hospital order, performed in [facility] hospital lab) Nasopharyngeal Nasopharyngeal Swab     Status: None   Collection Time: 03/30/20  2:26 AM   Specimen: Nasopharyngeal Swab  Result Value Ref Range Status   SARS Coronavirus 2 NEGATIVE NEGATIVE Final    Comment: (NOTE) SARS-CoV-2 target nucleic acids are NOT DETECTED.  The SARS-CoV-2 RNA is generally detectable in upper and lower respiratory specimens during the acute phase of infection. The lowest concentration of SARS-CoV-2 viral copies this assay can detect is 250 copies / mL. A negative result  does not preclude SARS-CoV-2 infection and should not be used as the sole basis for treatment or other patient management decisions.  A negative result may occur with improper specimen collection / handling, submission of specimen other than nasopharyngeal swab, presence of viral mutation(s) within the areas targeted by this assay, and inadequate number of viral copies (<250 copies / mL). A negative result must be combined with clinical observations, patient history, and epidemiological information.  Fact Sheet for Patients:   StrictlyIdeas.no  Fact Sheet for Healthcare Providers: BankingDealers.co.za  This test is not yet approved or  cleared by the [place] FDA and has been authorized for detection and/or diagnosis of SARS-CoV-2 by FDA under an Emergency Use Authorization (EUA).  This EUA will remain in effect (meaning this test can be used) for the duration of the COVID-19 declaration under Section 564(b)(1) of the Act, 21 U.S.C. section 360bbb-3(b)(1), unless the authorization is terminated or revoked sooner.  Performed at [facility], [address] [place],  [address]          Radiology Studies: No results found.      Scheduled Meds: . apixaban  5 mg Oral BID  . insulin aspart  0-20 Units Subcutaneous TID AC & HS  . insulin aspart  10 Units Subcutaneous TID WC  . insulin glargine  25 Units Subcutaneous Daily  . insulin starter kit- pen needles  1 kit Other Once  . living well with diabetes book   Does not apply Once   Continuous Infusions: . sodium chloride Stopped (03/30/20 1406)  . sodium chloride 75 mL/hr at 03/31/20 0500  . dextrose 5 % and 0.45% NaCl    . dextrose 5 % and 0.45% NaCl Stopped (03/30/20 0825)     Time spent: 30 minutes with over 50% of the time coordinating the patient's care    Harold Hedge, DO [facility] Pager [phone]  Call night coverage person  covering after 7pm

## 2020-03-31 NOTE — TOC Initial Note (Signed)
Transition of Care [facility]) - Initial/Assessment Note    Patient Details  Name: Darius Taylor MRN: [id number] Date of Birth: 1945-12-25  Transition of [facility]) CM/SW Contact:    [name], LCSW Phone Number: 03/31/2020, 1:30 PM  Clinical Narrative:  Met with pt to discuss discharge needs. He is aware he will need a HHRN to make sure he is doing his insulin right and reinforce teaching of this. He prefers to use [facility] due to has had them before. He was independent prior to admission and drives himself to his appointments. He is agreeable to change his diet and lifestyle due to he does not feel well when he came into the hospital Have made referral to [facility] and will await PT eval to see if other needs. Follow along while here in the hospital.                 Expected Discharge Plan: [place] Barriers to Discharge: No Barriers Identified   Patient Goals and CMS Choice Patient states their goals for this hospitalization and ongoing recovery are:: I hope to go home but know I need to do better with my eating and now I'm on insulin CMS Medicare.gov Compare Post Acute Care list provided to:: Patient Choice offered to / list presented to : Patient  Expected Discharge Plan and Services Expected Discharge Plan: [place] In-house Referral: Clinical Social Work   Post Acute Care Choice: [place] arrangements for the past 2 months: [place]: RN   Date [place]: 03/31/20 Time [place]: 1329 Representative spoke with at [place]: teresa  Prior Living Arrangements/Services Living arrangements for the past 2 months: [place] with:: Self Patient language and need for interpreter reviewed:: No Do you feel safe going back to the place where you live?: Yes      Need for Family Participation in Patient Care: No (Comment) Care giver support system  in place?: No (comment)   Criminal Activity/Legal Involvement Pertinent to Current Situation/Hospitalization: No - Comment as needed  Activities of Daily Living Home Assistive Devices/Equipment: Cane (specify quad or straight), Walker (specify type) ADL Screening (condition at time of admission) Patient's cognitive ability adequate to safely complete daily activities?: Yes Is the patient deaf or have difficulty hearing?: No Does the patient have difficulty seeing, even when wearing glasses/contacts?: No Does the patient have difficulty concentrating, remembering, or making decisions?: No Patient able to express need for assistance with ADLs?: Yes Does the patient have difficulty dressing or bathing?: No Independently performs ADLs?: Yes (appropriate for developmental age) Does the patient have difficulty walking or climbing stairs?: Yes Weakness of Legs: Both Weakness of Arms/Hands: None  Permission Sought/Granted Permission sought to share information with : Facility Art therapist granted to share information with : Yes, Verbal Permission Granted  Share Information with NAME: Helene Kelp  Permission granted to share info w AGENCY: [facility]        Emotional Assessment Appearance:: Appears stated age Attitude/Demeanor/Rapport: Gracious Affect (typically observed): Adaptable, Accepting Orientation: : Oriented to Self, Oriented to Place, Oriented to  Time, Oriented to Situation   Psych Involvement: No (comment)  Admission diagnosis:  Hyperosmolar hyperglycemic state (HHS) ([place]) [E11.00, E11.65] Patient Active Problem List   Diagnosis Date Noted  .  COPD with chronic bronchitis ([place]) 03/29/2020  . Chronic kidney disease (CKD) stage G3b/A3, moderately decreased glomerular filtration rate (GFR) between 30-44 mL/min/1.73 square meter and albuminuria creatinine ratio greater than 300 mg/g 03/29/2020  . Hyperosmolar hyperglycemic state (HHS) ([place]) 03/29/2020  . New onset  type 2 diabetes mellitus ([place]) 03/29/2020  . 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 Goals of care, counseling/discussion   . Palliative care encounter   . Acute on chronic diastolic CHF (congestive heart failure) ([place]) 02/23/2019  . Diastolic CHF, acute on chronic ([place]) 01/25/2019  . CAD (coronary artery disease) 12/30/2018  . Acute on chronic renal failure ([place]) 12/30/2018  . Alcoholic intoxication without complication ([place]) 97/28/2060  . CHF exacerbation ([place]) 11/03/2018  . Chronic diastolic heart failure ([place]) 09/24/2018  . HTN (hypertension) 09/24/2018  . Chronic respiratory failure with hypoxia ([place]) 08/30/2018  . History of colon cancer   . COPD exacerbation ([place]) 01/08/2018  . Cellulitis 11/18/2017  . History of colectomy secondary to colon cancer 03/27/2017  . History of nephrectomy secondary to [facility] 03/27/2017  . Hematoma   . Chronic atrial fibrillation ([place]) 03/26/2013   PCP:  [facility], [place]:   [place], [place] [address] Phone: [phone] Fax: [place] [address]), [place] [address] Phone: [phone] Fax: [phone]     Social Determinants of Health (SDOH) Interventions    Readmission Risk Interventions Readmission Risk Prevention Plan 03/05/2019 02/26/2019 01/26/2019  Transportation Screening Complete Complete Complete  Medication Review Press photographer) Complete Complete Complete  PCP or Specialist appointment within 3-5 days of discharge Complete Complete Complete  HRI or Home Care Consult Complete Complete Complete  SW Recovery Care/Counseling Consult Complete Not Complete Not Complete  SW Consult Not Complete Comments - NA N/A  Palliative Care Screening Not Applicable Not Applicable Not [place] Not Applicable Not  Applicable Not Applicable  Some recent data might be hidden

## 2020-03-31 NOTE — Plan of Care (Signed)
  RD consulted for nutrition education regarding diabetes.   Lab Results  Component Value Date   HGBA1C 12.2 (H) 03/30/2020   Met with patient at bedside. He reports he has a good appetite and is eating well. He reports he ate about 75% of breakfast this morning and 100% of his lunch. Patient reports he lives alone now as his wife has passed away. He does report he has one daughter nearby who can help him and another daughter who lives in [place]. Patient reports he typically eats 2-3 meals per day. He reports for breakfast he typically has eggs with oatmeal or toast. For lunch and dinner he may have fish or chicken with beans and other sides. Patient does report he was drinking sugar-sweetened beverages such as Kool-aid, sweet tea, pink lemonade, and orange juice throughout the day. He reports he is going to switch to sugar-free beverages.  RD provided "Carbohydrate Counting for People with Diabetes" handout from the Academy of Nutrition and Dietetics. Discussed different food groups and their effects on blood sugar, emphasizing carbohydrate-containing foods. Provided list of carbohydrates and recommended serving sizes of common foods.  Discussed importance of controlled and consistent carbohydrate intake throughout the day. Provided examples of ways to balance meals/snacks and encouraged intake of high-fiber, whole grain complex carbohydrates. Teach back method used.  Expect fair to good compliance.  Body mass index is 27.73 kg/m. Pt meets criteria for overweight based on current BMI.  Current diet order is heart healthy/carbohydrate modified, patient is consuming approximately 70-100% of meals at this time. Labs and medications reviewed. No further nutrition interventions warranted at this time. RD contact information provided. If additional nutrition issues arise, please re-consult RD.  Jacklynn Barnacle, MS, RD, LDN Pager number available on Amion

## 2020-03-31 NOTE — Progress Notes (Signed)
PT Cancellation Note  Patient Details Name: Darius Taylor MRN: [id number] DOB: November 30, 1945   Cancelled Treatment:    Reason Eval/Treat Not Completed: Patient declined, no reason specified;Other (comment) (Patient consult received and reviewed. Patient on phone when PT entered room, refused to get off phone or even address therapist. Will attempt again at later time/date.)  Janna Arch, PT, DPT   03/31/2020, 9:26 AM

## 2020-04-01 LAB — GLUCOSE, CAPILLARY
Glucose-Capillary: 118 mg/dL — ABNORMAL HIGH (ref 70–99)
Glucose-Capillary: 223 mg/dL — ABNORMAL HIGH (ref 70–99)
Glucose-Capillary: 270 mg/dL — ABNORMAL HIGH (ref 70–99)
Glucose-Capillary: 276 mg/dL — ABNORMAL HIGH (ref 70–99)
Glucose-Capillary: 130 mg/dL — ABNORMAL HIGH (ref 70–99)

## 2020-04-01 LAB — BASIC METABOLIC PANEL
Anion gap: 4 — ABNORMAL LOW (ref 5–15)
BUN: 28 mg/dL — ABNORMAL HIGH (ref 8–23)
CO2: 28 mmol/L (ref 22–32)
Calcium: 8.9 mg/dL (ref 8.9–10.3)
Chloride: 102 mmol/L (ref 98–111)
Creatinine, Ser: 1.38 mg/dL — ABNORMAL HIGH (ref 0.61–1.24)
GFR calc Af Amer: 58 mL/min — ABNORMAL LOW (ref >60)
GFR calc non Af Amer: 50 mL/min — ABNORMAL LOW (ref >60)
Glucose, Bld: 299 mg/dL — ABNORMAL HIGH (ref 70–99)
Potassium: 3.9 mmol/L (ref 3.5–5.1)
Sodium: 134 mmol/L — ABNORMAL LOW (ref 135–145)

## 2020-04-01 LAB — URINE CULTURE: Culture: 50000 — AB

## 2020-04-01 MED ORDER — COLCHICINE 0.6 MG PO TABS
0.6000 mg | ORAL_TABLET | Freq: Every day | ORAL | Status: DC
  Administered 2020-04-01 – 2020-04-05 (×5): 0.6 mg via ORAL
  Filled 2020-04-01 (×5): qty 1

## 2020-04-01 MED ORDER — MAGNESIUM OXIDE 400 MG PO TABS
400.0000 mg | ORAL_TABLET | Freq: Every day | ORAL | Status: DC
  Administered 2020-04-01 – 2020-04-05 (×5): 400 mg via ORAL
  Filled 2020-04-01 (×9): qty 1

## 2020-04-01 MED ORDER — FUROSEMIDE 40 MG PO TABS
80.0000 mg | ORAL_TABLET | Freq: Two times a day (BID) | ORAL | Status: DC
  Administered 2020-04-02 – 2020-04-05 (×7): 80 mg via ORAL
  Filled 2020-04-01 (×7): qty 2

## 2020-04-01 MED ORDER — INSULIN ASPART 100 UNIT/ML ~~LOC~~ SOLN
12.0000 [IU] | Freq: Three times a day (TID) | SUBCUTANEOUS | Status: DC
  Administered 2020-04-01 – 2020-04-02 (×3): 12 [IU] via SUBCUTANEOUS
  Filled 2020-04-01 (×4): qty 1

## 2020-04-01 MED ORDER — AMIODARONE HCL 200 MG PO TABS
200.0000 mg | ORAL_TABLET | Freq: Every day | ORAL | Status: DC
  Administered 2020-04-01 – 2020-04-05 (×5): 200 mg via ORAL
  Filled 2020-04-01 (×5): qty 1

## 2020-04-01 MED ORDER — FUROSEMIDE 10 MG/ML IJ SOLN
40.0000 mg | Freq: Once | INTRAMUSCULAR | Status: AC
  Administered 2020-04-01: 40 mg via INTRAVENOUS
  Filled 2020-04-01: qty 4

## 2020-04-01 MED ORDER — INSULIN GLARGINE 100 UNIT/ML ~~LOC~~ SOLN
30.0000 [IU] | Freq: Every day | SUBCUTANEOUS | Status: DC
  Administered 2020-04-02 – 2020-04-04 (×3): 30 [IU] via SUBCUTANEOUS
  Filled 2020-04-01 (×4): qty 0.3

## 2020-04-01 MED ORDER — METOPROLOL SUCCINATE ER 50 MG PO TB24
200.0000 mg | ORAL_TABLET | Freq: Every day | ORAL | Status: DC
  Administered 2020-04-01 – 2020-04-05 (×5): 200 mg via ORAL
  Filled 2020-04-01 (×5): qty 4

## 2020-04-01 MED ORDER — FUROSEMIDE 10 MG/ML IJ SOLN
80.0000 mg | Freq: Once | INTRAMUSCULAR | Status: AC
  Administered 2020-04-01: 80 mg via INTRAVENOUS
  Filled 2020-04-01: qty 8

## 2020-04-01 MED ORDER — FUROSEMIDE 10 MG/ML IJ SOLN
40.0000 mg | Freq: Once | INTRAMUSCULAR | Status: AC
  Administered 2020-04-01: 10:00:00 40 mg via INTRAVENOUS
  Filled 2020-04-01: qty 4

## 2020-04-01 MED ORDER — ATORVASTATIN CALCIUM 20 MG PO TABS
80.0000 mg | ORAL_TABLET | Freq: Every day | ORAL | Status: DC
  Administered 2020-04-01 – 2020-04-05 (×5): 80 mg via ORAL
  Filled 2020-04-01 (×5): qty 4

## 2020-04-01 MED ORDER — ALLOPURINOL 100 MG PO TABS
100.0000 mg | ORAL_TABLET | Freq: Every day | ORAL | Status: DC
  Administered 2020-04-01 – 2020-04-05 (×5): 100 mg via ORAL
  Filled 2020-04-01 (×6): qty 1

## 2020-04-01 NOTE — Progress Notes (Addendum)
PROGRESS NOTE    Darius Taylor    Code Status: Full Code  JSE:[id number] DOB: 1946/09/15 DOA: 03/29/2020 LOS: 2 days  PCP: [facility] [employer] CC:  Chief Complaint  Patient presents with  . Weakness       Hospital Summary   Darius Taylor is a 74 y.o. male with a history of atrial fibrillation on eliquis and amiodarone, HFpE, COPD, CAD, TAVR, left nephrectomy secondary to [place], colectomy secondary to colon cancer who presented with 1 week history of weakness, diarrhea and nausea with poor oral intake and who was admitted for HHS and newly diagnosed Diabetes and placed on an insulin drip. He has since been transitioned to subcutaneous insulin but continues to have generalized weakness and hyperglycemia. Diabetic coordinator has been consulted as well as PT.   Patient was also noted to have gross hematuria in the ED which was discussed with Urology who did not recommend any intervention at this time unless decompensation and to continue Eliquis per his Urologist recommendations in Care Everywhere for history of clots.   7/9: tachypnea with increased work of breathing and noted to be volume overloaded on exam. Noted that he had not been taking his home lasix 80 mg bid while in the hospital. He was given Lasix 40 mg IV x 2 with increased respiratory status.   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 HHS/Newly diagnosed DM a. Increase Lantus to 30 u daily and Novolog 12 u  TID with meals b. HA1c 12.2  c. Initially not comfortable with taking insulin at home but after further discussion he was amenable to starting long acting with oral medications at discharge.  d. Diabetic coordinator on board e. TOC consult to assist with needs at discharge  2. Generalized weakness, likely from debility and hyperglycemia a. PT eval  3. Gross hematuria a. Discussed with urology the other day, can continue eliquis. No need for intervention unless he has worsening bleeding and/or clots.  b. Since the hematuria has not improved, I will reach back out to urology   4. HFpEF exacerbation 1. Was not getting his lasix 80 mg bid while in the hospital since admission and is now volume overloaded with Wt 90->94 kg, rales, increased work of breathing and tachypnea 2. Given Lasix 80 mg IV this morning 3. Additional Lasix IV this PM and restart home regimen tomorrow if stable.   5. Chronic respiratory failure with hypoxia, stable 1. Intermittently requiring O2   6. Atrial fibrillation, rate controlled a. Continue eliquis unless further urologic issue, per Urology b. Restart home amiodarone and Toprol XL  7. HTN 1. Restart Toprol XL and amiodarone 2. Hold amlodipine for now and likely restart tomorrow if BP allows  8. CAD, stable  9. COPD, stable  10. AKI on CKD 3b, resolved 1. At/near baseline  11. Headache 1. No redflags, likely tension 2. Tylenol  12. History of colon cancer s/p colectomy   DVT prophylaxis:  apixaban (ELIQUIS) tablet 5  mg   Family Communication: no family at bedside   Disposition Plan:  Status is: Inpatient  Remains inpatient appropriate because:Unsafe d/c plan   Dispo: The patient is from: Home              Anticipated d/c is to: TBD              Anticipated d/c date is: 1 day              Patient currently is not medically stable to d/c.          Pressure injury documentation    None  Consultants  None  Procedures   None  Antibiotics   Anti-infectives (From admission, onward)   None        Subjective   Notified this morning regarding patient's change in clinical status increased work of breathing.  At bedside patient was clearly tachypneic and was given IV lasix and with complaint of shortness of breath. Otherwise currently ok with starting long acting insulin with orals at discharge. No other issues overnight and no complaints of chest pain, palpitations, leg swelling or any other issues.   Objective   Vitals:   04/01/20 0028 04/01/20 0328 04/01/20 0821 04/01/20 1207  BP: 139/75 135/69 (!) 149/85 (!) 161/88  Pulse: 63 70 84 78  Resp: 16 (!) 22 (!) 24   Temp: 99 F (37.2 C) 98.7 F (37.1 C) 99 F (37.2 C) 98.2 F (36.8 C)  TempSrc: Oral Oral Oral Oral  SpO2: 98% 98% 98% 98%  Weight:   94.8 kg   Height:        Intake/Output Summary (Last 24 hours) at 04/01/2020 1247 Last data filed at 04/01/2020 0500 Gross per 24 hour  Intake 987.94 ml  Output 1050 ml  Net -62.06 ml   Filed Weights   03/29/20 2155 04/01/20 0821  Weight: 90.2 kg 94.8 kg    Examination:  Physical Exam Vitals and nursing note reviewed. Exam conducted with a chaperone present.  Constitutional:      General: He is in acute distress.  HENT:     Head: Normocephalic.     Mouth/Throat:     Mouth: Mucous membranes are moist.  Eyes:     Conjunctiva/sclera: Conjunctivae normal.  Cardiovascular:     Rate and Rhythm: Normal rate.  Pulmonary:     Effort: Respiratory distress present.     Breath sounds: Rales present. No wheezing.  Abdominal:     General: There is no distension.     Tenderness: There is no abdominal tenderness.  Genitourinary:    Comments: Gross hematuria Musculoskeletal:     Right lower leg: No edema.     Left lower leg: No edema.  Neurological:     Mental Status: He is alert. Mental status is at baseline.  Psychiatric:        Mood and Affect: Mood normal.        Behavior: Behavior normal.      Data Reviewed: I have personally reviewed following labs and imaging studies  CBC: Recent Labs  Lab 03/29/20 2201  WBC 7.2  HGB 11.0*  HCT 34.2*  MCV 94.5  PLT 637   Basic Metabolic Panel: Recent Labs  Lab 03/30/20 0400 03/30/20 1239 03/30/20 1433 03/30/20 2229 04/01/20 1124  NA 132* 134* 132* 133* 134*  K 3.9 3.7 4.2 4.2 3.9  CL 96* 101 96* 98 102  CO2 26 25 25 26 28   GLUCOSE 304* 368* 410* 289*  299*  BUN 48* 43* 45* 45* 28*  CREATININE 2.00* 1.63* 1.83* 1.62* 1.38*  CALCIUM 8.9 7.6* 8.8* 8.9 8.9   GFR: Estimated Creatinine Clearance: 54.4 mL/min (A) (by C-G formula based on SCr of 1.38 mg/dL (H)). Liver Function Tests: No results for input(s): AST, ALT, ALKPHOS, BILITOT, PROT, ALBUMIN in the last 168 hours. No results for input(s): LIPASE, AMYLASE in the last 168 hours. No results for input(s): AMMONIA in the last 168 hours. Coagulation Profile: No results for input(s): INR, PROTIME in the last 168 hours. Cardiac Enzymes: No results for input(s): CKTOTAL, CKMB, CKMBINDEX, TROPONINI in the last 168 hours. BNP (last 3 results) No results for input(s): PROBNP in the last 8760 hours. HbA1C: Recent Labs    03/30/20 0004  HGBA1C 12.2*   CBG: Recent Labs  Lab 03/31/20 1603 03/31/20 2108 04/01/20 0303 04/01/20 0808 04/01/20 1204  GLUCAP 214* 130* 118* 223* 270*   Lipid Profile: No results for input(s): CHOL, HDL, LDLCALC, TRIG, CHOLHDL, LDLDIRECT in the last 72 hours. Thyroid Function Tests: No results for input(s): TSH, T4TOTAL, FREET4, T3FREE, THYROIDAB in the last 72 hours. Anemia Panel: No results for input(s): VITAMINB12, FOLATE, FERRITIN, TIBC, IRON, RETICCTPCT in the last 72 hours. Sepsis Labs: No results for input(s): PROCALCITON, LATICACIDVEN in the last 168 hours.  Recent Results (from the past 240 hour(s))  Urine culture     Status: Abnormal   Collection Time: 03/29/20 11:37 PM   Specimen: Urine, Clean Catch  Result Value Ref Range  Status   Specimen Description   Final    URINE, CLEAN CATCH Performed at [facility], [address] [place], [place] [address]    Special Requests   Final    NONE Performed at [facility], [place], [place] [address]    Culture 50,000 COLONIES/mL ENTEROCOCCUS FAECALIS (A)  Final   Report Status 04/01/2020 FINAL  Final   Organism ID, Bacteria ENTEROCOCCUS FAECALIS (A)  Final      Susceptibility   Enterococcus faecalis - MIC*    AMPICILLIN <=2 SENSITIVE Sensitive     NITROFURANTOIN <=16 SENSITIVE Sensitive     VANCOMYCIN 1 SENSITIVE Sensitive     * 50,000 COLONIES/mL ENTEROCOCCUS FAECALIS  SARS Coronavirus 2 by RT PCR (hospital order, performed in [place] hospital lab) Nasopharyngeal Nasopharyngeal Swab     Status: None   Collection Time: 03/30/20  2:26 AM   Specimen: Nasopharyngeal Swab  Result Value Ref Range Status   SARS Coronavirus 2 NEGATIVE NEGATIVE Final    Comment: (NOTE) SARS-CoV-2 target nucleic acids are NOT DETECTED.  The SARS-CoV-2 RNA is generally detectable in upper and lower respiratory specimens during the acute phase of infection. The lowest concentration of SARS-CoV-2 viral copies this assay can detect is 250 copies / mL. A negative result does not preclude SARS-CoV-2 infection and should not be used as the sole basis for treatment or other patient management decisions.  A negative result may occur with improper specimen collection / handling, submission of specimen other than nasopharyngeal swab, presence of viral mutation(s) within the areas targeted by this assay, and inadequate number of viral copies (<250 copies / mL). A negative result must be combined with clinical observations, patient history, and epidemiological information.  Fact Sheet for Patients:   StrictlyIdeas.no  Fact Sheet for Healthcare Providers: BankingDealers.co.za  This test is not yet approved  or  cleared by the [place] FDA and has been authorized for detection and/or diagnosis of SARS-CoV-2 by  FDA under an Emergency Use Authorization (EUA).  This EUA will remain in effect (meaning this test can be used) for the duration of the COVID-19 declaration under Section 564(b)(1) of the Act, 21 U.S.C. section 360bbb-3(b)(1), unless the authorization is terminated or revoked sooner.  Performed at [facility], [address] [place], [place] [address]          Radiology Studies: No results found.      Scheduled Meds: . apixaban  5 mg Oral BID  . insulin aspart  0-20 Units Subcutaneous TID AC & HS  . insulin aspart  10 Units Subcutaneous TID WC  . insulin glargine  25 Units Subcutaneous Daily  . living well with diabetes book   Does not apply Once   Continuous Infusions:    Time spent: 35 minutes with over 50% of the time coordinating the patient's care    Darius Hedge, DO [facility] Pager [phone]  Call night coverage person covering after 7pm

## 2020-04-01 NOTE — Progress Notes (Signed)
Inpatient Diabetes Program Recommendations  AACE/ADA: New Consensus Statement on Inpatient Glycemic Control   Target Ranges:  Prepandial:   less than 140 mg/dL      Peak postprandial:   less than 180 mg/dL (1-2 hours)      Critically ill patients:  140 - 180 mg/dL   Results for Darius Taylor, Darius Taylor (MRN [id number]) as of 04/01/2020 14:24  Ref. Range 03/31/2020 08:38 03/31/2020 11:34 03/31/2020 16:03 03/31/2020 21:08 04/01/2020 03:03 04/01/2020 08:08 04/01/2020 12:04  Glucose-Capillary Latest Ref Range: 70 - 99 mg/dL 137 (H) 253 (H) 214 (H) 130 (H) 118 (H) 223 (H) 270 (H)   Review of Glycemic Control  Diabetes history:No Outpatient Diabetes medications:NA Current orders for Inpatient glycemic control:Lantus 25 units daily, Novolog 0-20 units AC&HS, Novolog 10 units TID with meals  Inpatient Diabetes Program Recommendations:    HbgA1C: A1C 12.2% on 03/30/20 indicating an average glucose of 303 mg/dl over the past 2-3 months. Patient is agreeable to take insulin at home but prefers a simply regimen. Would recommend Lantus once a day along with oral DM medication(s) and have patient follow up with PCP.  NOTE: Met with patient again to review information about DM and insulin pen. Patient has insulin starter kit at bedside but has not reviewed it yet. Patient states that he wants a simple medication regimen for his DM as he lives alone. Informed patient that MD is considering prescribing once a day basal insulin along with oral DM medication(s).  Patient states that will be best for him versus taking 4 insulin injections per day. Had patient review and demonstrate how to use the insulin pen. Patient was able to successfully demonstrate how to use an insulin pen with minimal verbal cues. Reminded patient that the insulin starter kit has step by step instructions with how to use the insulin pen as well. Asked patient if he could have his daughter come watch him at home do the first few injections to be sure he feels  comfortable. Patient states that his daughter works 8am-8pm and it would depend on her schedule and what she has planned. Reminded patient that TOC is working to have nurse come out to his home after discharge to ensure he is doing well with DM self-management and getting use to self injecting insulin. Patient reports that PT is recommended short term rehab and he is considering it. Encouraged patient to continue to use inpatient opportunity to continue to learn about DM and insulin injections while he is here in the hospital. Discussed FreeStyle Libre2 CGM with patient and asked if he was interested in having a sensor placed now and receiving the reader device as well. Patient states that he would like to try the FreeStyle Libre2 CGM. Education done regarding application and changing CGM sensor (alternate every 14 days on back of arms) and 24 hour warm-up period.  Informed patient that MD has given me permission to place a sensor on him now if he was interested in using it. Placed FreeStyle Libre2 sensor on back of upper right arm. Informed patient that it would be requested that nursing staff allow him to scan his arm each time they come in to perform finger stick glucose to help him get use to using the CGM.  Encouraged patient to self administer insulin each time to ensure proper technique with insulin injections. Patient verbalized understanding of information discussed and states that he has no question at this time related to DM, insulin, or FreeStyle Libre2. Spoke with [place], RN  and informed about FreeStyle Libre2 CGM and asked that patient be allowed to scan the sensor each time finger stick is check to help him get use to it and also to allow patient to self administer insulin injections.  Thanks, Barnie Alderman, RN, MSN, CDE Diabetes Coordinator Inpatient Diabetes Program [phone] (Team Pager from 8am to [place])    .

## 2020-04-01 NOTE — Evaluation (Signed)
Physical Therapy Evaluation Patient Details Name: Darius Taylor MRN: [id number] DOB: Nov 27, 1945 Today's Date: 04/01/2020   History of Present Illness  Mr. Darius Taylor is a 74 y/o male who was admitted for hypersmodal hyperglycemic state. Chief complaints include 1 week history of weakness, diarrhea, nausea, and poor appetite. PMH includes chronic A-fib on Eliquis and amiodoarone, diastolic heart failure, COPD, CAD, s/p TAVR, L nephrectomy secondary to renal carcinoma, colectomy secondary to colon cancer, AICD and pacemaker placement, and history of ventricular tachycardia.  Clinical Impression  Pt lying in bed upon arrival and reports that he hasn't been out of bed since he got to the hospital. Pt's external catheter urine in canister noted to be pinkish/red in color. Pt agreeable to PT session at this time. Pt required mod A for truncal elevation from elevated HOB. Pt performed sit <> stand transfer with CGA and ambulated CGA for steadying and safety. Fatigue is a limiting factor. Pt remained on 2L O2 throughout session and noted with mildly labored breathing after ambulating 50 feet within the room. At this time, pt with generalized weakness and requires assistance for bed mobility, transfers, and ambulation when pt was independent prior to admission. Recommend skilled therapy during acute stay to address current strength and functional mobility deficits and STR at discharge to optimize return to PLOF and for decreased assistance in home setting.    Follow Up Recommendations SNF    Equipment Recommendations  None recommended by PT    Recommendations for Other Services       Precautions / Restrictions Precautions Precautions: Fall Restrictions Weight Bearing Restrictions: No      Mobility  Bed Mobility Overal bed mobility: Needs Assistance Bed Mobility: Supine to Sit     Supine to sit: Mod assist     General bed mobility comments: Mod A for trunk elevation into sitting from elevated  HOB  Transfers Overall transfer level: Needs assistance Equipment used: Rolling walker (2 wheeled) Transfers: Sit to/from Stand Sit to Stand: Min guard         General transfer comment: CGA for sit to stand from bed for steadying  Ambulation/Gait Ambulation/Gait assistance: Min guard Gait Distance (Feet): 50 Feet Assistive device: Rolling walker (2 wheeled) Gait Pattern/deviations: Step-through pattern Gait velocity: decreased   General Gait Details: Pt with reciprocal gait pattern noted with slightly narrow BOS.  Stairs            Wheelchair Mobility    Modified Rankin (Stroke Patients Only)       Balance Overall balance assessment: Needs assistance Sitting-balance support: Feet supported Sitting balance-Leahy Scale: Good Sitting balance - Comments: no LOB noted   Standing balance support: Bilateral upper extremity supported Standing balance-Leahy Scale: Good Standing balance comment: BUE on RW with CGA for safety                             Pertinent Vitals/Pain Pain Assessment: Faces Pain Score: 2  Faces Pain Scale: Hurts a little bit Pain Location: chest (reports has had intermittent chest pain for years) Pain Descriptors / Indicators: Discomfort Pain Intervention(s): Monitored during session    Home Living Family/patient expects to be discharged to:: Private residence Living Arrangements: Alone Available Help at Discharge: Family;Available PRN/intermittently Type of Home: House Home Access: Stairs to enter Entrance Stairs-Rails: Can reach both Entrance Stairs-Number of Steps: 3 Home Layout: One level Home Equipment: Clinical cytogeneticist - 2 wheels;Walker - 4 wheels;Cane - quad;Grab bars - tub/shower  Additional Comments: Pt reports that he has a daughter who lives nearby however she works and would only be available from time to time.    Prior Function Level of Independence: Needs assistance   Gait / Transfers Assistance Needed:  independent and reports no AD utilization  ADL's / Homemaking Assistance Needed: occassional assist  Comments: Pt denies falls and reports getting assistance from [place] for bathing and self-care; pt reports trying to make do with chores but states his daughter comes from time to time to throw some clothes in the laundry.      Hand Dominance   Dominant Hand: Right    Extremity/Trunk Assessment   Upper Extremity Assessment Upper Extremity Assessment: Generalized weakness;Overall WFL for tasks assessed (overall 4-/5)    Lower Extremity Assessment Lower Extremity Assessment: Generalized weakness;Overall WFL for tasks assessed (overall 4-/5)       Communication   Communication: No difficulties  Cognition Arousal/Alertness: Awake/alert Behavior During Therapy: WFL for tasks assessed/performed Overall Cognitive Status: Within Functional Limits for tasks assessed                                 General Comments: Pt A&O x 4.      General Comments      Exercises Other Exercises Other Exercises: seated therex: APs, seated marches and LAQ x 10 bilaterally   Assessment/Plan    PT Assessment Patient needs continued PT services  PT Problem List Decreased strength;Decreased activity tolerance;Decreased balance;Decreased mobility       PT Treatment Interventions DME instruction;Gait training;Stair training;Functional mobility training;Therapeutic activities;Therapeutic exercise;Balance training;Neuromuscular re-education;Patient/family education    PT Goals (Current goals can be found in the Care Plan section)  Acute Rehab PT Goals Patient Stated Goal: to get stronger and get right PT Goal Formulation: With patient Time For Goal Achievement: 04/15/20 Potential to Achieve Goals: Good Additional Goals Additional Goal #1: Pt will perform bed mobility and transfers independently to optimize return to PLOF. (to maximize functional mobility)    Frequency Min  2X/week   Barriers to discharge Decreased caregiver support pt lives alone and gets assistance from [facility] "6 Clicks" Mobility  Outcome Measure Help needed turning from your back to your side while in a flat bed without using bedrails?: None Help needed moving from lying on your back to sitting on the side of a flat bed without using bedrails?: A Lot Help needed moving to and from a bed to a chair (including a wheelchair)?: A Little Help needed standing up from a chair using your arms (e.g., wheelchair or bedside chair)?: A Little Help needed to walk in hospital room?: A Little Help needed climbing 3-5 steps with a railing? : A Little 6 Click Score: 18    End of Session Equipment Utilized During Treatment: Gait belt;Oxygen Activity Tolerance: Patient limited by fatigue Patient left: in chair;with call bell/phone within reach;with chair alarm set Nurse Communication: Mobility status PT Visit Diagnosis: Unsteadiness on feet (R26.81);Muscle weakness (generalized) (M62.81)    Time: 1130-1209 PT Time Calculation (min) (ACUTE ONLY): 39 min   Charges:             Vale Haven, SPT  Vale Haven 04/01/2020, 1:33 PM

## 2020-04-01 NOTE — Care Management Important Message (Signed)
Important Message  Patient Details  Name: Darius Taylor MRN: [id number] Date of Birth: June 12, 1946   Medicare Important Message Given:  N/A - LOS <3 / Initial given by admissions     Juliann Pulse A Hayzlee Mcsorley 04/01/2020, 9:18 AM

## 2020-04-02 DIAGNOSIS — J9611 Chronic respiratory failure with hypoxia: Secondary | ICD-10-CM

## 2020-04-02 DIAGNOSIS — I10 Essential (primary) hypertension: Secondary | ICD-10-CM

## 2020-04-02 LAB — BASIC METABOLIC PANEL
Anion gap: 8 (ref 5–15)
BUN: 32 mg/dL — ABNORMAL HIGH (ref 8–23)
CO2: 27 mmol/L (ref 22–32)
Calcium: 9.1 mg/dL (ref 8.9–10.3)
Chloride: 100 mmol/L (ref 98–111)
Creatinine, Ser: 1.4 mg/dL — ABNORMAL HIGH (ref 0.61–1.24)
GFR calc Af Amer: 57 mL/min — ABNORMAL LOW (ref >60)
GFR calc non Af Amer: 49 mL/min — ABNORMAL LOW (ref >60)
Glucose, Bld: 76 mg/dL (ref 70–99)
Potassium: 3.7 mmol/L (ref 3.5–5.1)
Sodium: 135 mmol/L (ref 135–145)

## 2020-04-02 LAB — GLUCOSE, CAPILLARY
Glucose-Capillary: 108 mg/dL — ABNORMAL HIGH (ref 70–99)
Glucose-Capillary: 248 mg/dL — ABNORMAL HIGH (ref 70–99)
Glucose-Capillary: 257 mg/dL — ABNORMAL HIGH (ref 70–99)
Glucose-Capillary: 207 mg/dL — ABNORMAL HIGH (ref 70–99)
Glucose-Capillary: 92 mg/dL (ref 70–99)

## 2020-04-02 LAB — CBC
HCT: 30.1 % — ABNORMAL LOW (ref 39.0–52.0)
Hemoglobin: 10.1 g/dL — ABNORMAL LOW (ref 13.0–17.0)
MCH: 30.8 pg (ref 26.0–34.0)
MCHC: 33.6 g/dL (ref 30.0–36.0)
MCV: 91.8 fL (ref 80.0–100.0)
Platelets: 197 10*3/uL (ref 150–400)
RBC: 3.28 MIL/uL — ABNORMAL LOW (ref 4.22–5.81)
RDW: 15.1 % (ref 11.5–15.5)
WBC: 6.4 10*3/uL (ref 4.0–10.5)
nRBC: 0 % (ref 0.0–0.2)

## 2020-04-02 LAB — MAGNESIUM: Magnesium: 1.8 mg/dL (ref 1.7–2.4)

## 2020-04-02 MED ORDER — INSULIN ASPART 100 UNIT/ML ~~LOC~~ SOLN
15.0000 [IU] | Freq: Three times a day (TID) | SUBCUTANEOUS | Status: DC
  Administered 2020-04-02 – 2020-04-04 (×7): 15 [IU] via SUBCUTANEOUS
  Filled 2020-04-02 (×6): qty 1

## 2020-04-02 MED ORDER — AMLODIPINE BESYLATE 10 MG PO TABS
10.0000 mg | ORAL_TABLET | Freq: Every day | ORAL | Status: DC
  Administered 2020-04-02 – 2020-04-05 (×4): 10 mg via ORAL
  Filled 2020-04-02 (×4): qty 1

## 2020-04-02 NOTE — Progress Notes (Signed)
PROGRESS NOTE    Darius Taylor    Code Status: Full Code  WCB:[id number] DOB: [birth year] DOA: 03/29/2020 LOS: 3 days  PCP: Inc, [employer] CC:  Chief Complaint  Patient presents with  . Weakness       Hospital Summary   Darius Taylor is a 74 y.o. male with a history of atrial fibrillation on eliquis and amiodarone, HFpE, COPD, CAD, TAVR, left nephrectomy secondary to [place], colectomy secondary to colon cancer who presented with 1 week history of weakness, diarrhea and nausea with poor oral intake and who was admitted for HHS and newly diagnosed Diabetes and placed on an insulin drip. He has since been transitioned to subcutaneous insulin but continues to have generalized weakness and hyperglycemia. Diabetic coordinator has been consulted as well as PT.   Patient was also noted to have gross hematuria in the ED which was discussed with Urology who did not recommend any intervention at this time unless decompensation and to continue Eliquis per his Urologist recommendations in Care Everywhere for history of clots.   7/9: tachypnea with increased work of breathing and noted to be volume overloaded on exam. Noted that he had not been taking his home lasix 80 mg bid while in the hospital. He was given Lasix 40 mg IV x 2 with increased respiratory status.   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 HHS/Newly diagnosed DM a. Continue Lantus to 30 u daily and increase  Novolog to 15 u TID with meals with sliding scale b. HA1c 12.2  c. Diabetic coordinator on board d. Plan for long acting insulin with oral meds at discharge. Patient not comfortable with too many injections throughout the day e. TOC consult to assist with needs at discharge  2. Generalized weakness, likely from debility and hyperglycemia a. PT eval -> SNF   3. Gross hematuria a. Discussed with urology the other day, can continue eliquis. No need for intervention unless he has worsening bleeding and/or clots.  b. Hematuria has improved today, no intervention for now. Has an appointment with Urology on 04/07/20 that he should keep   4. HFpEF exacerbation 1. Was not getting his lasix 80 mg bid while in the hospital since admission and became volume overloaded 2. Now much improved after lasix 3. Restart home Lasix regimen  5. Chronic respiratory failure with hypoxia, stable 1. Intermittently requiring O2   6. Atrial fibrillation, rate controlled a. Continue eliquis unless further urologic issue, per Urology b. Restarted home amiodarone and Toprol XL  7. HTN 1. Restarted Toprol XL and amiodarone 2. Restart home amlodipine  8. CAD, stable  9. COPD, stable  10. AKI on CKD 3b, resolved 1. At/near baseline  11. Headache 1. No redflags, likely tension 2. Tylenol  12. History of colon cancer s/p colectomy   DVT prophylaxis:  apixaban (ELIQUIS) tablet 5 mg   Family Communication: no family at bedside   Disposition Plan: plan is for DC to SNF.  Status is:  Inpatient  Remains inpatient appropriate because:Unsafe d/c plan   Dispo: The patient is from: Home              Anticipated d/c is to: SNF              Anticipated d/c date is: 2 days              Patient currently is medically stable to d/c.          Pressure injury documentation    None  Consultants  None  Procedures  None  Antibiotics   Anti-infectives (From admission, onward)   None         Subjective   Patient seen and examined at bedside no acute distress and resting comfortably.  No events overnight.  Tolerating diet.  Denies any chest pain, shortness of breath, fever, nausea, vomiting, urinary complaints. Felling better today. Otherwise ROS negative   Objective   Vitals:   04/02/20 0024 04/02/20 0445 04/02/20 0818 04/02/20 1220  BP: 136/74 (!) 146/85 (!) 161/79 (!) 149/55  Pulse: 61 62  62  Resp: 20 15 18 17   Temp: 98.9 F (37.2 C) 98.5 F (36.9 C) 98.2 F (36.8 C) 98.4 F (36.9 C)  TempSrc: Oral Oral Oral   SpO2: 99% 99% 97% 98%  Weight:      Height:        Intake/Output Summary (Last 24 hours) at 04/02/2020 1302 Last data filed at 04/02/2020 0800 Gross per 24 hour  Intake 120 ml  Output 1150 ml  Net -1030 ml   Filed Weights   03/29/20 2155 04/01/20 0821  Weight: 90.2 kg 94.8 kg    Examination:  Physical Exam Vitals and nursing note reviewed.  Constitutional:      Appearance: Normal appearance.  HENT:     Head: Normocephalic and atraumatic.  Cardiovascular:     Rate and Rhythm: Normal rate and regular rhythm.  Pulmonary:     Effort: Pulmonary effort is normal.     Breath sounds: Normal breath sounds.  Abdominal:     General: Abdomen is flat.     Palpations: Abdomen is soft.  Genitourinary:    Comments: Urine clear yellow Musculoskeletal:        General: No swelling or tenderness.  Skin:    Coloration: Skin is not jaundiced or pale.  Neurological:     Mental Status: He is alert. Mental status is at baseline.     Data Reviewed: I have personally reviewed following labs and imaging studies  CBC: Recent Labs  Lab 03/29/20 2201 04/02/20 0409  WBC 7.2 6.4  HGB 11.0* 10.1*  HCT 34.2* 30.1*  MCV 94.5 91.8  PLT 201 149   Basic Metabolic Panel: Recent Labs  Lab 03/30/20 1239 03/30/20 1433 03/30/20 2229 04/01/20 1124 04/02/20 0409  NA 134* 132* 133* 134* 135  K 3.7 4.2 4.2 3.9 3.7  CL 101 96* 98 102 100  CO2 25 25  26 28 27   GLUCOSE 368* 410* 289* 299* 76  BUN 43* 45* 45* 28* 32*  CREATININE 1.63* 1.83* 1.62* 1.38* 1.40*  CALCIUM 7.6* 8.8* 8.9 8.9 9.1  MG  --   --   --   --  1.8   GFR: Estimated Creatinine Clearance: 53.6 mL/min (A) (by C-G formula based on SCr of 1.4 mg/dL (H)). Liver Function Tests: No results for input(s): AST, ALT, ALKPHOS, BILITOT, PROT, ALBUMIN in the last 168 hours. No results for input(s): LIPASE, AMYLASE in the  last 168 hours. No results for input(s): AMMONIA in the last 168 hours. Coagulation Profile: No results for input(s): INR, PROTIME in the last 168 hours. Cardiac Enzymes: No results for input(s): CKTOTAL, CKMB, CKMBINDEX, TROPONINI in the last 168 hours. BNP (last 3 results) No results for input(s): PROBNP in the last 8760 hours. HbA1C: No results for input(s): HGBA1C in the last 72 hours. CBG: Recent Labs  Lab 04/01/20 1703 04/01/20 2143 04/02/20 0743 04/02/20 1108 04/02/20 1218  GLUCAP 276* 130* 108* 248* 257*   Lipid Profile: No results for input(s): CHOL, HDL, LDLCALC, TRIG, CHOLHDL, LDLDIRECT in the last 72 hours. Thyroid Function Tests: No results for input(s): TSH, T4TOTAL, FREET4, T3FREE, THYROIDAB in the last 72 hours. Anemia Panel: No results for input(s): VITAMINB12, FOLATE, FERRITIN, TIBC, IRON, RETICCTPCT in the last 72 hours. Sepsis Labs: No results for input(s): PROCALCITON, LATICACIDVEN in the last 168 hours.  Recent Results (from the past 240 hour(s))  Urine culture     Status: Abnormal   Collection Time: 03/29/20 11:37 PM   Specimen: Urine, Clean Catch  Result Value Ref Range Status   Specimen Description   Final    URINE, CLEAN CATCH Performed at [facility], [address], [place], [place] [address]    Special Requests   Final    NONE Performed at [facility], [place], [place] [address]    Culture 50,000 COLONIES/mL ENTEROCOCCUS FAECALIS (A)  Final   Report Status 04/01/2020 FINAL   Final   Organism ID, Bacteria ENTEROCOCCUS FAECALIS (A)  Final      Susceptibility   Enterococcus faecalis - MIC*    AMPICILLIN <=2 SENSITIVE Sensitive     NITROFURANTOIN <=16 SENSITIVE Sensitive     VANCOMYCIN 1 SENSITIVE Sensitive     * 50,000 COLONIES/mL ENTEROCOCCUS FAECALIS  SARS Coronavirus 2 by RT PCR (hospital order, performed in [place] hospital lab) Nasopharyngeal Nasopharyngeal Swab     Status: None   Collection Time: 03/30/20  2:26 AM   Specimen: Nasopharyngeal Swab  Result Value Ref Range Status   SARS Coronavirus 2 NEGATIVE NEGATIVE Final    Comment: (NOTE) SARS-CoV-2 target nucleic acids are NOT DETECTED.  The SARS-CoV-2 RNA is generally detectable in upper and lower respiratory specimens during the acute phase of infection. The lowest concentration of SARS-CoV-2 viral copies this assay can detect is 250 copies / mL. A negative result does not preclude SARS-CoV-2 infection and should not be used as the sole basis for treatment or other patient management decisions.  A negative result may occur with improper specimen collection / handling, submission of specimen other than nasopharyngeal swab, presence of viral mutation(s) within the areas targeted by this assay, and inadequate number of viral copies (<250 copies / mL). A negative result must be combined with clinical observations, patient history, and epidemiological information.  Fact Sheet for Patients:   StrictlyIdeas.no  Fact Sheet for Healthcare Providers: BankingDealers.co.za  This test is not yet approved or  cleared by the [place] FDA and has been authorized for detection and/or diagnosis of SARS-CoV-2 by FDA under an Emergency Use Authorization (EUA).  This EUA will remain in effect (meaning this test can be used) for the duration of the COVID-19 declaration under Section 564(b)(1) of the Act, 21 U.S.C. section 360bbb-3(b)(1), unless the  authorization is terminated or revoked sooner.  Performed at [facility], [address], [place], [place] [address]          Radiology Studies:  No results found.      Scheduled Meds: . allopurinol  100 mg Oral Daily  . amiodarone  200 mg Oral Daily  . apixaban  5 mg Oral BID  . atorvastatin  80 mg Oral Daily  . colchicine  0.6 mg Oral Daily  . furosemide  80 mg Oral BID  . insulin aspart  0-20 Units Subcutaneous TID AC & HS  . insulin aspart  12 Units Subcutaneous TID WC  . insulin glargine  30 Units Subcutaneous Daily  . living well with diabetes book   Does not apply Once  . magnesium oxide  400 mg Oral Daily  . metoprolol  200 mg Oral Daily   Continuous Infusions:    Time spent: 30 minutes with over 50% of the time coordinating the patient's care    Harold Hedge, DO [facility] Pager [phone]  Call night coverage person covering after 7pm

## 2020-04-03 LAB — GLUCOSE, CAPILLARY
Glucose-Capillary: 104 mg/dL — ABNORMAL HIGH (ref 70–99)
Glucose-Capillary: 210 mg/dL — ABNORMAL HIGH (ref 70–99)
Glucose-Capillary: 181 mg/dL — ABNORMAL HIGH (ref 70–99)
Glucose-Capillary: 113 mg/dL — ABNORMAL HIGH (ref 70–99)

## 2020-04-03 MED ORDER — POLYETHYLENE GLYCOL 3350 17 G PO PACK
17.0000 g | PACK | Freq: Every day | ORAL | Status: DC
  Administered 2020-04-03 – 2020-04-05 (×3): 17 g via ORAL
  Filled 2020-04-03 (×3): qty 1

## 2020-04-03 NOTE — TOC Progression Note (Cosign Needed)
Transition of Care [facility]) - Progression Note    Patient Details  Name: Darius Taylor MRN: [id number] Date of Birth: Nov 04, 1945  Transition of Care [facility]) CM/SW Contact  Elliot Gurney [place], [place] Phone Number: [phone] 04/03/2020, 12:34 PM  Clinical Narrative:    This Education officer, museum met with patient at bedside to discuss new recommendation for SNF. Patient agrees with recommendation and would prefer [facility] if at all possible. FL2 completed and faxed out for bed offers which will be reviewed with patient once received.   [address], LCSW Transition of Care [phone]    Expected Discharge Plan: Skilled Nursing Facility Barriers to Discharge: No Barriers Identified  Expected Discharge Plan and Services Expected Discharge Plan: [place] In-house Referral: Clinical Social Work   Post Acute Care Choice: [place] arrangements for the past 2 months: [place]: RN   Date [place]: 03/31/20 Time [place]: 1329 Representative spoke with at [place]: teresa   Social Determinants of Health ([place]) Interventions    Readmission Risk Interventions Readmission Risk Prevention Plan 03/05/2019 02/26/2019 01/26/2019  Transportation Screening Complete Complete Complete  Medication Review Press photographer) Complete Complete Complete  PCP or Specialist appointment within 3-5 days of discharge Complete Complete Complete  HRI or Home Care Consult Complete Complete Complete  SW Recovery Care/Counseling Consult Complete Not Complete Not Complete  SW Consult Not Complete Comments - NA N/A  Palliative Care Screening Not Applicable Not Applicable Not [place] Not Applicable Not Applicable Not Applicable  Some recent data might be hidden

## 2020-04-03 NOTE — Progress Notes (Signed)
PROGRESS NOTE    Darius Taylor    Code Status: Full Code  HQP:[id number] DOB: 09-06-1946 DOA: 03/29/2020 LOS: 4 days  PCP: [facility], [employer] CC:  Chief Complaint  Patient presents with  . Weakness       Hospital Summary   Darius Taylor is a 74 y.o. male with a history of atrial fibrillation on eliquis and amiodarone, HFpE, COPD, CAD, TAVR, left nephrectomy secondary to [place], colectomy secondary to colon cancer who presented with 1 week history of weakness, diarrhea and nausea with poor oral intake and who was admitted for HHS and newly diagnosed Diabetes and placed on an insulin drip. He has since been transitioned to subcutaneous insulin but continues to have generalized weakness and hyperglycemia. Diabetic coordinator has been consulted as well as PT.   Patient was also noted to have gross hematuria in the ED which was discussed with Urology who did not recommend any intervention at this time unless decompensation and to continue Eliquis per his Urologist recommendations in Care Everywhere for history of clots.   7/9: tachypnea with increased work of breathing and noted to be volume overloaded on exam. Noted that he had not been taking his home lasix 80 mg bid while in the hospital. He was given Lasix 40 mg IV x 2 with increased respiratory status.   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 HHS/Newly diagnosed DM a. Continue Lantus to 30 u daily and increase  Novolog to 15 u TID with meals with sliding scale b. HA1c 12.2  c. Diabetic coordinator on board - [name] 2 sensor placed on RUE. Unfortunately it seems to be having inaccurate readings when compared to BMPs and POC glucose. Diabetic coordinator will reevaluate tomorrow d. Plan for long acting insulin with oral meds at discharge. Patient not comfortable with too many injections throughout the day. I have asked the nurse to let the patient give himself  the insulin while chaperoned to help with education and comfort e. TOC consult to assist with needs at discharge  2. Generalized weakness, likely from debility and hyperglycemia a. PT eval -> SNF  b. Seems improved today, appreciate PT reevaluating   3. Gross hematuria a. Discussed with urology the other day, can continue eliquis. No need for intervention unless he has worsening bleeding and/or clots.  b. Hematuria has improved, no intervention for now. Has an appointment with Urology on 04/07/20 that he should keep   4. HFpEF exacerbation 1. Was not getting his lasix 80 mg bid while in the hospital since admission and became volume overloaded 2. Restarted home Lasix regimen and resolved exacerbation  5. Chronic respiratory failure with hypoxia, stable 1. Intermittently requiring O2   6. Atrial fibrillation, rate controlled a. Continue eliquis unless further urologic issue, per Urology b. Restarted home amiodarone and Toprol XL  7. HTN 1. Tolerating home meds  8. CAD, stable  9. COPD, stable  10. AKI on CKD 3b, resolved 1. At/near baseline  11. Headache, resolved 1. Tylenol prn  12. History of colon cancer s/p colectomy   DVT prophylaxis:  apixaban (ELIQUIS) tablet 5 mg   Family Communication: discussed with daughter today  Disposition Plan: plan is for DC to SNF once bed available. Also need diabetic coordinator to reevaluate Darius Taylor Status is: Inpatient  Remains inpatient appropriate because:Unsafe d/c  plan   Dispo: The patient is from: Home              Anticipated d/c is to: SNF              Anticipated d/c date is: 1 day              Patient currently is medically stable to d/c.          Pressure injury documentation    None  Consultants  None  Procedures  None  Antibiotics   Anti-infectives (From admission, onward)   None        Subjective   Patient seen and examined at bedside no acute distress and resting comfortably.  No events overnight.  Tolerating diet.  Denies any chest pain, shortness of breath, fever, nausea, vomiting, new urinary complaints.   Otherwise ROS negative   Objective   Vitals:   04/02/20 2011 04/02/20 2331 04/03/20 0356 04/03/20 0745  BP: (!) 158/67 (!) 149/46 121/73 130/83  Pulse: 63 65 66 60  Resp: 17 17 20 17   Temp: 98.2 F (36.8 C) 98 F (36.7 C) 97.6 F (36.4 C) 98.3 F (36.8 C)  TempSrc:   Oral   SpO2: 99% 100% 95% 100%  Weight:      Height:        Intake/Output Summary (Last 24 hours) at 04/03/2020 1151 Last data filed at 04/03/2020 0821 Gross per 24 hour  Intake --  Output 900 ml  Net -900 ml   Filed Weights   03/29/20 2155 04/01/20 0821  Weight: 90.2 kg 94.8 kg    Examination:  Physical Exam Vitals and nursing note reviewed.  Constitutional:      Appearance: Normal appearance.  HENT:     Head: Normocephalic and atraumatic.  Eyes:     Conjunctiva/sclera: Conjunctivae normal.  Cardiovascular:     Rate and Rhythm: Normal rate and regular rhythm.  Pulmonary:     Effort: Pulmonary effort is normal.     Breath sounds: Normal breath sounds. No wheezing or rales.  Abdominal:     General: Abdomen is flat.     Palpations: Abdomen is soft.  Musculoskeletal:        General: No swelling or tenderness.  Skin:    Coloration: Skin is not jaundiced or pale.  Neurological:     Mental Status: He is alert. Mental status is at baseline.  Psychiatric:        Mood and Affect: Mood normal.        Behavior:  Behavior normal.     Data Reviewed: I have personally reviewed following labs and imaging studies  CBC: Recent Labs  Lab 03/29/20 2201 04/02/20 0409  WBC 7.2 6.4  HGB 11.0* 10.1*  HCT 34.2* 30.1*  MCV 94.5 91.8  PLT 201 528   Basic Metabolic Panel: Recent Labs  Lab 03/30/20 1239 03/30/20 1433 03/30/20 2229 04/01/20 1124 04/02/20 0409  NA 134* 132* 133* 134* 135  K 3.7 4.2 4.2 3.9 3.7  CL 101 96* 98 102 100  CO2 25 25 26 28 27   GLUCOSE 368* 410* 289* 299* 76  BUN 43* 45* 45* 28* 32*  CREATININE 1.63* 1.83* 1.62* 1.38* 1.40*  CALCIUM 7.6* 8.8* 8.9 8.9 9.1  MG  --   --   --   --  1.8   GFR: Estimated Creatinine Clearance: 53.6 mL/min (A) (by C-G formula based on SCr of 1.4 mg/dL (H)). Liver Function Tests: No results for input(s): AST, ALT, ALKPHOS, BILITOT, PROT, ALBUMIN in the last 168 hours. No results for input(s): LIPASE, AMYLASE in the last 168 hours. No results for input(s): AMMONIA in the last 168 hours. Coagulation Profile: No results for input(s): INR, PROTIME in the last 168 hours. Cardiac Enzymes: No results for input(s): CKTOTAL, CKMB, CKMBINDEX, TROPONINI in the last 168 hours. BNP (last 3 results) No results for input(s): PROBNP in the last 8760 hours. HbA1C: No results for input(s): HGBA1C in the last 72 hours. CBG: Recent Labs  Lab 04/02/20 1108 04/02/20 1218 04/02/20 1629 04/02/20 2128 04/03/20 0746  GLUCAP 248* 257* 207* 92 104*   Lipid Profile: No results for input(s): CHOL, HDL, LDLCALC, TRIG, CHOLHDL, LDLDIRECT in the last 72 hours. Thyroid Function Tests: No results for input(s): TSH, T4TOTAL, FREET4, T3FREE, THYROIDAB in the last 72 hours. Anemia Panel: No results for input(s): VITAMINB12, FOLATE, FERRITIN, TIBC, IRON, RETICCTPCT in the last 72 hours. Sepsis Labs: No results for input(s): PROCALCITON, LATICACIDVEN in the last 168 hours.  Recent Results (from the past 240 hour(s))  Urine culture     Status: Abnormal    Collection Time: 03/29/20 11:37 PM   Specimen: Urine, Clean Catch  Result Value Ref Range Status   Specimen Description   Final    URINE, CLEAN CATCH Performed at [facility], [address], [place], [place] [address]    Special Requests   Final    NONE Performed at [facility], [place], [place] [address]    Culture 50,000 COLONIES/mL ENTEROCOCCUS FAECALIS (A)  Final   Report Status 04/01/2020 FINAL  Final   Organism ID, Bacteria ENTEROCOCCUS FAECALIS (A)  Final      Susceptibility   Enterococcus faecalis - MIC*    AMPICILLIN <=2 SENSITIVE Sensitive     NITROFURANTOIN <=16 SENSITIVE Sensitive     VANCOMYCIN 1 SENSITIVE Sensitive     * 50,000 COLONIES/mL ENTEROCOCCUS FAECALIS  SARS Coronavirus 2 by RT PCR (hospital order, performed in [place] hospital lab) Nasopharyngeal Nasopharyngeal Swab     Status: None   Collection Time: 03/30/20  2:26 AM   Specimen: Nasopharyngeal Swab  Result Value Ref Range Status   SARS Coronavirus 2 NEGATIVE NEGATIVE Final    Comment: (NOTE) SARS-CoV-2 target nucleic acids are NOT DETECTED.  The SARS-CoV-2 RNA is generally detectable in upper and lower respiratory specimens during the acute phase of infection. The lowest concentration of SARS-CoV-2 viral copies this assay can detect is 250 copies / mL. A negative result does not preclude SARS-CoV-2 infection and should not be used as the sole basis for treatment or other patient management decisions.  A negative result may occur with improper specimen collection / handling, submission of specimen other than nasopharyngeal swab, presence of viral mutation(s) within the areas targeted by this assay, and inadequate number of viral copies (<250 copies / mL). A negative result must be combined with clinical observations, patient history, and epidemiological information.  Fact Sheet for Patients:   StrictlyIdeas.no  Fact Sheet for  Healthcare Providers: BankingDealers.co.za  This test is not yet approved or  cleared by the [place] FDA  and has been authorized for detection and/or diagnosis of SARS-CoV-2 by FDA under an Emergency Use Authorization (EUA).  This EUA will remain in effect (meaning this test can be used) for the duration of the COVID-19 declaration under Section 564(b)(1) of the Act, 21 U.S.C. section 360bbb-3(b)(1), unless the authorization is terminated or revoked sooner.  Performed at [facility], [address], [place], [place] [address]          Radiology Studies: No results found.      Scheduled Meds: . allopurinol  100 mg Oral Daily  . amiodarone  200 mg Oral Daily  . amLODipine  10 mg Oral Daily  . apixaban  5 mg Oral BID  . atorvastatin  80 mg Oral Daily  . colchicine  0.6 mg Oral Daily  . furosemide  80 mg Oral BID  . insulin aspart  0-20 Units Subcutaneous TID AC & HS  . insulin aspart  15 Units Subcutaneous TID WC  . insulin glargine  30 Units Subcutaneous Daily  . living well with diabetes book   Does not apply Once  . magnesium oxide  400 mg Oral Daily  . metoprolol  200 mg Oral Daily  . polyethylene glycol  17 g Oral Daily   Continuous Infusions:    Time spent: 20 minutes with over 50% of the time coordinating the patient's care    Harold Hedge, DO [facility] Pager [phone]  Call night coverage person covering after 7pm

## 2020-04-03 NOTE — Progress Notes (Signed)
Patient able to self administer subcu insulin with only verbal prompting. Patient does state he is nervous about drawing up insulin r/t vision impairment.

## 2020-04-03 NOTE — NC FL2 (Signed)
[place] LEVEL OF CARE SCREENING TOOL     IDENTIFICATION  Patient Name: Darius Taylor Birthdate: 1946-03-16 Sex: male Admission Date (Current Location): 03/29/2020  [place] and [place] Number:  Engineering geologist and Address:  [facility], [address], [place], [place] [address]      Provider Number:    Attending Physician Name and Address:  Harold Hedge, MD  Relative Name and Phone Number:  [place],[name] (Daughter) [phone]    Current Level of Care: SNF Recommended Level of Care: [place] Prior Approval Number:    Date Approved/Denied:   PASRR Number: [id number]  Discharge Plan: SNF    Current Diagnoses: Patient Active Problem List   Diagnosis Date Noted  . COPD with chronic bronchitis ([place]) 03/29/2020  . Chronic kidney disease (CKD) stage G3b/A3, moderately decreased glomerular filtration rate (GFR) between 30-44 mL/min/1.73 square meter and albuminuria creatinine ratio greater than 300 mg/g 03/29/2020  . Hyperosmolar hyperglycemic state (HHS) ([place]) 03/29/2020  . New onset type 2 diabetes mellitus ([place]) 03/29/2020  . 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 Goals of care, counseling/discussion   . Palliative care encounter   . Acute on chronic diastolic CHF (congestive heart failure) ([place]) 02/23/2019  . Diastolic CHF, acute on chronic ([place]) 01/25/2019  . CAD (coronary artery disease) 12/30/2018  . Acute on chronic renal failure ([place]) 12/30/2018  . Alcoholic intoxication without complication ([place]) 89/16/9450  . CHF exacerbation ([place]) 11/03/2018  . Chronic diastolic heart failure ([place]) 09/24/2018  . HTN (hypertension) 09/24/2018  . Chronic respiratory failure with hypoxia ([place]) 08/30/2018  . History of colon cancer   . COPD exacerbation ([place]) 01/08/2018  . Cellulitis 11/18/2017  . History of colectomy secondary to colon cancer 03/27/2017  . History of nephrectomy  secondary to [facility] 03/27/2017  . Hematoma   . Chronic atrial fibrillation (HCC) 03/26/2013    Orientation RESPIRATION BLADDER Height & Weight     Self, Time, Situation, Place  O2 External catheter Weight: 208 lb 15.9 oz (94.8 kg) Height:  5\' 11"  (180.3 cm)  BEHAVIORAL SYMPTOMS/MOOD NEUROLOGICAL BOWEL NUTRITION STATUS      Continent Diet  AMBULATORY STATUS COMMUNICATION OF NEEDS Skin   Limited Assist Verbally Normal                       Personal Care Assistance Level of Assistance  Bathing, Feeding, Dressing Bathing Assistance: Limited assistance Feeding assistance: Limited assistance Dressing Assistance: Limited assistance     Functional Limitations Info  Sight, Hearing, Speech Sight Info: Adequate Hearing Info: Adequate Speech Info: Adequate    SPECIAL CARE FACTORS FREQUENCY  PT (By licensed PT), OT (By licensed OT)     PT Frequency: 5x per week OT Frequency: 5x per week            Contractures Contractures Info: Not present    Additional Factors Info  Code Status Code Status Info: full code             Current Medications (04/03/2020):  This is the current hospital active medication list Current Facility-Administered Medications  Medication Dose Route Frequency Provider Last Rate Last Admin  . acetaminophen (TYLENOL) tablet 650 mg  650 mg Oral Q6H PRN Harold Hedge, MD   650 mg at 04/03/20 0352  . allopurinol (ZYLOPRIM) tablet 100 mg  100 mg Oral Daily Harold Hedge, MD   100 mg at 04/03/20 0815  . amiodarone (PACERONE) tablet  200 mg  200 mg Oral Daily Harold Hedge, MD   200 mg at 04/03/20 0815  . amLODipine (NORVASC) tablet 10 mg  10 mg Oral Daily Harold Hedge, MD   10 mg at 04/03/20 0815  . apixaban (ELIQUIS) tablet 5 mg  5 mg Oral BID Athena Masse, MD   5 mg at 04/03/20 0816  . atorvastatin (LIPITOR) tablet 80 mg  80 mg Oral Daily Harold Hedge, MD   80 mg at 04/03/20 0815  . colchicine tablet 0.6 mg  0.6 mg Oral Daily Harold Hedge, MD    0.6 mg at 04/03/20 0815  . cyclobenzaprine (FLEXERIL) tablet 5 mg  5 mg Oral TID PRN Sharion Settler, NP   5 mg at 03/31/20 1145  . dextrose 50 % solution 0-50 mL  0-50 mL Intravenous PRN Gregor Hams, MD      . dextrose 50 % solution 0-50 mL  0-50 mL Intravenous PRN Athena Masse, MD      . furosemide (LASIX) tablet 80 mg  80 mg Oral BID Harold Hedge, MD   80 mg at 04/03/20 0815  . insulin aspart (novoLOG) injection 0-20 Units  0-20 Units Subcutaneous TID AC & HS Sharion Settler, NP   7 Units at 04/02/20 1726  . insulin aspart (novoLOG) injection 15 Units  15 Units Subcutaneous TID WC Harold Hedge, MD   15 Units at 04/03/20 [phone]  . insulin glargine (LANTUS) injection 30 Units  30 Units Subcutaneous Daily Harold Hedge, MD   30 Units at 04/03/20 0816  . ipratropium-albuterol (DUONEB) 0.5-2.5 (3) MG/3ML nebulizer solution 3 mL  3 mL Nebulization Q6H PRN Athena Masse, MD      . living well with diabetes book MISC   Does not apply Once Harold Hedge, MD      . magnesium oxide (MAG-OX) tablet 400 mg  400 mg Oral Daily Harold Hedge, MD   400 mg at 04/03/20 0815  . metoprolol succinate (TOPROL-XL) 24 hr tablet 200 mg  200 mg Oral Daily Harold Hedge, MD   200 mg at 04/03/20 0815  . polyethylene glycol (MIRALAX / GLYCOLAX) packet 17 g  17 g Oral Daily Harold Hedge, MD         Discharge Medications: Please see discharge summary for a list of discharge medications.  Relevant Imaging Results:  Relevant Lab Results:   Additional Information SSN [id number]  Elliot Gurney [place], [place]

## 2020-04-04 LAB — GLUCOSE, CAPILLARY
Glucose-Capillary: 219 mg/dL — ABNORMAL HIGH (ref 70–99)
Glucose-Capillary: 345 mg/dL — ABNORMAL HIGH (ref 70–99)
Glucose-Capillary: 89 mg/dL (ref 70–99)
Glucose-Capillary: 51 mg/dL — ABNORMAL LOW (ref 70–99)
Glucose-Capillary: 65 mg/dL — ABNORMAL LOW (ref 70–99)
Glucose-Capillary: 78 mg/dL (ref 70–99)

## 2020-04-04 NOTE — Progress Notes (Signed)
Hypoglycemic Event  CBG: 51  Treatment: 8oz OJ, package of graham crackers and 3 tbsp peanut butter, lunchbox given to and consumed by pt.   Symptoms: pt reports feeling "weak and shakey"  Follow-up CBG: Time: 2203 CBG Result: 65   Comments/MD notified:E.Ouma NP notified verbally, pt was given 8oz gingerale and this RN will repeat blood glucose at 2230. Advised by NP that if pt is below 70 on next glucose check, to administer 1/2 amp of D5.     Horton Finer

## 2020-04-04 NOTE — Progress Notes (Signed)
Pt able to self administer subcu insulin with only verbal prompting. Awaiting session with diabetic educator.

## 2020-04-04 NOTE — Progress Notes (Signed)
Repeat blood glucose 78.

## 2020-04-04 NOTE — Progress Notes (Addendum)
Inpatient Diabetes Program Recommendations  AACE/ADA: New Consensus Statement on Inpatient Glycemic Control (2015)  Target Ranges:  Prepandial:   less than 140 mg/dL      [facility] postprandial:   less than 180 mg/dL (1-2 hours)      Critically ill patients:  140 - 180 mg/dL   Results for Darius Taylor, Darius Taylor (MRN [id number]) as of 04/04/2020 10:02  Ref. Range 04/03/2020 07:46 04/03/2020 12:42 04/03/2020 16:37 04/03/2020 21:09  Glucose-Capillary Latest Ref Range: 70 - 99 mg/dL 104 (H)  15 units NOVOLOG +  30 units LANTUS  210 (H)  22 units NOVOLOG  181 (H)  19 units NOVOLOG  113 (H)   Results for Darius Taylor, Darius Taylor (MRN [id number]) as of 04/04/2020 11:41  Ref. Range 04/04/2020 07:53 04/04/2020 10:56  Glucose-Capillary Latest Ref Range: 70 - 99 mg/dL 219 (H)  22 units NOVOLOG +  30 units LANTUS 345 (H)  30 units NOVOLOG     Admit with: Hyperosmolar hyperglycemic state (HHS)/ New onset type 2 diabetes mellitus    Current Orders: Lantus 30 units Daily      Novolog Resistant Correction Scale/ SSI (0-20 units) TID AC + HS       Novolog 15 units TID with meals     MD- Note significant glucose elevations today.  May consider the following:  1. Increase Lantus to 35 units Daily  2. Increase Novolog Meal Coverage to: Novolog 18 units TID with meals  Patient is agreeable to take insulin at home but prefers a simple regimen. Would recommend Lantus once a day along with oral DM medication(s) and have patient follow up with PCP.  MD- Patient also wanted me to make sure to tell you that he would prefer insulin pens when he goes home.  Was able to use the insulin pen demo pen proficiently today.    Please give pt Rxs for: 1. Lantus Insulin Pen- Order # [phone] 2. Insulin Pen Needles- Order # [phone]    Note Freestyle Libre 2 glucose sensor was placed on patient on 07/09.   RN Tiffany called the Diabetes team this AM with concerns that the Freestyle Libre CGM was reading about 20 points  different than the hospital meter.  This is a common occurrence among different glucose meters and different meters can sometimes be 10-50 points different.  Asked Tiffany to keep a close eye on the readings pt is getting with his sensor versus the hospital meter and to alert the diabetes team if there are large significant differences in the readings (ie. 50-100 mg/dl different consistently).  Met with patient this AM to re-review Insulin pen for home.  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  Re-Reviewed all steps of insulin pen including attachment of needle, 2-unit air shot, dialing up dose, giving injection, rotation of injection sites, removing needle, disposal of sharps, storage of unused insulin, disposal of insulin etc.  Patient able to provide successful return demonstration.  Reviewed troubleshooting with insulin pen.  Also re-reviewed Signs/Symptoms of Hypoglycemia with patient and how to treat Hypoglycemia at home.  Have asked RNs caring for patient to please allow patient to give all injections here in hospital as much as possible for practice.      --Will follow patient during hospitalization--  Wyn Quaker RN, MSN, CDE Diabetes Coordinator Inpatient Glycemic Control Team Team Pager: [phone] (8a-5p)

## 2020-04-04 NOTE — Care Management Important Message (Signed)
Important Message  Patient Details  Name: Darius Taylor MRN: [id number] Date of Birth: 07-23-1946   Medicare Important Message Given:  Yes     Juliann Pulse A Zamiah Tollett 04/04/2020, 12:12 PM

## 2020-04-04 NOTE — Progress Notes (Signed)
PROGRESS NOTE    Darius Taylor    Code Status: Full Code  WJX:[id number] DOB: 1946/09/20 DOA: 03/29/2020 LOS: 5 days  PCP: [facility], [employer] CC:  Chief Complaint  Patient presents with  . Weakness       Hospital Summary   Darius Taylor is a 74 y.o. male with a history of atrial fibrillation on eliquis and amiodarone, HFpE, COPD, CAD, TAVR, left nephrectomy secondary to [place], colectomy secondary to colon cancer who presented with 1 week history of weakness, diarrhea and nausea with poor oral intake and who was admitted for HHS and newly diagnosed Diabetes and placed on an insulin drip. He has since been transitioned to subcutaneous insulin but continues to have generalized weakness and hyperglycemia. Diabetic coordinator has been consulted as well as PT.   Patient was also noted to have gross hematuria in the ED which was discussed with Urology who did not recommend any intervention at this time unless decompensation and to continue Eliquis per his Urologist recommendations in Care Everywhere for history of clots.   7/9: tachypnea with increased work of breathing and noted to be volume overloaded on exam. Noted that he had not been taking his home lasix 80 mg bid while in the hospital. He was given Lasix 40 mg IV x 2 with increased respiratory status.   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 HHS/Newly diagnosed DM a. Continue Lantus to 30 u daily and increase  Novolog to 15 u TID with meals with sliding scale b. HA1c 12.2  c. Diabetic coordinator on board - [name] 2 sensor placed on RUE.  d. Plan for long acting insulin with oral meds at discharge.  Has been educated on insulin injection e. TOC consult to assist with needs at discharge  2. Generalized weakness, likely from debility and hyperglycemia a. PT eval -> SNF  3. Gross hematuria a. Discussed with urology the other day, can continue eliquis. No need for intervention unless he has worsening bleeding and/or clots.  b. Urine now clear with pink tinge and improving c. Has an appointment with Urology on 04/07/20 that he needs to keep   4. HFpEF exacerbation, resolved  5. Chronic respiratory failure with hypoxia, stable 1. Intermittently requiring O2   6. Atrial fibrillation, rate controlled a. Continue eliquis unless further urologic issue, per Urology b. Restarted home amiodarone and Toprol XL  7. HTN 1. Tolerating home meds  8. CAD, stable  9. COPD, stable  10. AKI on CKD 3b, resolved 1. At/near baseline  11. Headache, resolved 1. Tylenol prn  12. History of colon cancer s/p colectomy   DVT prophylaxis:  apixaban (ELIQUIS) tablet 5 mg   Family Communication: discussed with daughter today  Disposition Plan: plan is for DC to SNF once bed available,  Probably tomorrow Status is: Inpatient  Remains inpatient appropriate because:Unsafe d/c plan   Dispo: The patient is from: Home  Anticipated d/c is to: SNF              Anticipated d/c date is: 1 day              Patient currently is medically stable to d/c.          Pressure injury documentation    None  Consultants  None  Procedures  None  Antibiotics   Anti-infectives (From admission, onward)   None        Subjective   Doing well today without any complaints, improved hematuria.  No complaints or overnight events.   Objective   Vitals:   04/04/20 0008 04/04/20  0403 04/04/20 0750 04/04/20 1200  BP: (!) 97/41 (!) 128/49 106/65 (!) 145/65  Pulse: 63 65 63 60  Resp: 17 16 16 17   Temp: 98 F (36.7 C) 98.3 F (36.8 C) 98.2 F (36.8 C) 98.2 F (36.8 C)  TempSrc:   Oral   SpO2: 94% 90% 98% 98%  Weight:      Height:        Intake/Output Summary (Last 24 hours) at 04/04/2020 1225 Last data filed at 04/04/2020 1100 Gross per 24 hour  Intake --  Output 1445 ml  Net -1445 ml   Filed Weights   03/29/20 2155 04/01/20 0821  Weight: 90.2 kg 94.8 kg    Examination:  Physical Exam Vitals and nursing note reviewed.  Constitutional:      Appearance: Normal appearance.  HENT:     Head: Normocephalic and atraumatic.  Cardiovascular:     Rate and Rhythm: Normal rate and regular rhythm.  Pulmonary:     Effort: Pulmonary effort is normal.     Breath sounds: Normal breath sounds.  Abdominal:     General: Abdomen is flat.     Palpations: Abdomen is soft.  Genitourinary:    Comments: Pink-tinged urine Musculoskeletal:        General: No swelling or tenderness.  Skin:    Coloration: Skin is not jaundiced or pale.  Neurological:     Mental Status: He is alert. Mental status is at baseline.     Data Reviewed: I have personally reviewed following labs and imaging studies  CBC: Recent Labs  Lab 03/29/20 2201 04/02/20 0409  WBC 7.2 6.4  HGB 11.0* 10.1*  HCT 34.2* 30.1*  MCV 94.5 91.8  PLT 201 453   Basic Metabolic Panel: Recent Labs  Lab 03/30/20 1239 03/30/20 1433 03/30/20 2229 04/01/20 1124 04/02/20 0409  NA 134* 132* 133* 134* 135  K 3.7 4.2 4.2 3.9 3.7  CL 101 96* 98 102 100  CO2 25 25 26 28 27   GLUCOSE 368* 410* 289* 299* 76  BUN 43* 45* 45* 28* 32*  CREATININE 1.63* 1.83* 1.62* 1.38* 1.40*  CALCIUM 7.6* 8.8* 8.9 8.9 9.1  MG  --   --   --   --  1.8   GFR: Estimated Creatinine Clearance: 53.6 mL/min (A) (by C-G formula based on SCr of 1.4 mg/dL (H)). Liver Function Tests: No results for input(s): AST, ALT, ALKPHOS,  BILITOT, PROT, ALBUMIN in the last 168 hours. No results for input(s): LIPASE, AMYLASE in the last 168 hours. No results for input(s): AMMONIA in the last 168 hours. Coagulation Profile: No results for input(s): INR, PROTIME in the last 168 hours. Cardiac Enzymes: No results for input(s): CKTOTAL, CKMB, CKMBINDEX, TROPONINI in the last 168 hours. BNP (last 3 results) No results for input(s): PROBNP in the last 8760 hours.  HbA1C: No results for input(s): HGBA1C in the last 72 hours. CBG: Recent Labs  Lab 04/03/20 1242 04/03/20 1637 04/03/20 2109 04/04/20 0753 04/04/20 1056  GLUCAP 210* 181* 113* 219* 345*   Lipid Profile: No results for input(s): CHOL, HDL, LDLCALC, TRIG, CHOLHDL, LDLDIRECT in the last 72 hours. Thyroid Function Tests: No results for input(s): TSH, T4TOTAL, FREET4, T3FREE, THYROIDAB in the last 72 hours. Anemia Panel: No results for input(s): VITAMINB12, FOLATE, FERRITIN, TIBC, IRON, RETICCTPCT in the last 72 hours. Sepsis Labs: No results for input(s): PROCALCITON, LATICACIDVEN in the last 168 hours.  Recent Results (from the past 240 hour(s))  Urine culture     Status: Abnormal   Collection Time: 03/29/20 11:37 PM   Specimen: Urine, Clean Catch  Result Value Ref Range Status   Specimen Description   Final    URINE, CLEAN CATCH Performed at [facility], [address], [place], [place] [address]    Special Requests   Final    NONE Performed at [facility], [place], [place] [address]    Culture 50,000 COLONIES/mL ENTEROCOCCUS FAECALIS (A)  Final   Report Status 04/01/2020 FINAL  Final   Organism ID, Bacteria ENTEROCOCCUS FAECALIS (A)  Final      Susceptibility   Enterococcus faecalis - MIC*    AMPICILLIN <=2 SENSITIVE Sensitive     NITROFURANTOIN <=16 SENSITIVE Sensitive     VANCOMYCIN 1 SENSITIVE Sensitive     * 50,000 COLONIES/mL ENTEROCOCCUS FAECALIS  SARS Coronavirus 2 by RT PCR (hospital order, performed in  [place] hospital lab) Nasopharyngeal Nasopharyngeal Swab     Status: None   Collection Time: 03/30/20  2:26 AM   Specimen: Nasopharyngeal Swab  Result Value Ref Range Status   SARS Coronavirus 2 NEGATIVE NEGATIVE Final    Comment: (NOTE) SARS-CoV-2 target nucleic acids are NOT DETECTED.  The SARS-CoV-2 RNA is generally detectable in upper and lower respiratory specimens during the acute phase of infection. The lowest concentration of SARS-CoV-2 viral copies this assay can detect is 250 copies / mL. A negative result does not preclude SARS-CoV-2 infection and should not be used as the sole basis for treatment or other patient management decisions.  A negative result may occur with improper specimen collection / handling, submission of specimen other than nasopharyngeal swab, presence of viral mutation(s) within the areas targeted by this assay, and inadequate number of viral copies (<250 copies / mL). A negative result must be combined with clinical observations, patient history, and epidemiological information.  Fact Sheet for Patients:   StrictlyIdeas.no  Fact Sheet for Healthcare Providers: BankingDealers.co.za  This test is not yet approved or  cleared by the [place] FDA and has been authorized for detection and/or diagnosis of SARS-CoV-2 by FDA under an Emergency Use Authorization (EUA).  This EUA will remain in effect (meaning this test can be used) for the duration of the COVID-19 declaration under Section 564(b)(1) of the Act, 21 U.S.C. section 360bbb-3(b)(1), unless the authorization is terminated or revoked sooner.  Performed at [facility], [address], [place], [place] [address]          Radiology Studies: No results found.      Scheduled Meds: . allopurinol  100 mg Oral Daily  . amiodarone  200 mg Oral Daily  . amLODipine  10 mg Oral Daily  . apixaban  5 mg Oral BID  . atorvastatin   80 mg Oral Daily  . colchicine  0.6 mg  Oral Daily  . furosemide  80 mg Oral BID  . insulin aspart  0-20 Units Subcutaneous TID AC & HS  . insulin aspart  15 Units Subcutaneous TID WC  . insulin glargine  30 Units Subcutaneous Daily  . living well with diabetes book   Does not apply Once  . magnesium oxide  400 mg Oral Daily  . metoprolol  200 mg Oral Daily  . polyethylene glycol  17 g Oral Daily   Continuous Infusions:    Time spent: 16 minutes with over 50% of the time coordinating the patient's care    Harold Hedge, DO [facility] Pager [phone]  Call night coverage person covering after 7pm

## 2020-04-04 NOTE — TOC Progression Note (Signed)
Transition of Care [facility]) - Progression Note    Patient Details  Name: Darius Taylor MRN: [id number] Date of Birth: 1946/03/04  Transition of [facility] CM/SW Contact  Skanda Worlds, Gardiner Rhyme, LCSW Phone Number: 04/04/2020, 9:29 AM  Clinical Narrative:   Met with pt to confirm him wanting to go to [facility] for rehab. He does and has been vaccinated. Ref [id number] started for insurance auth. Have messaged [facility] will accept bed. Faxed clinicals to [facility] health    Expected Discharge Plan: Skilled Nursing Facility Barriers to Discharge: No Barriers Identified  Expected Discharge Plan and Services Expected Discharge Plan: [place] In-house Referral: Clinical Social Work   Post Acute Care Choice: [place] arrangements for the past 2 months: [place]: RN   Date [place]: 03/31/20 Time [place]: [place] Representative spoke with at [place]: teresa   Social Determinants of Health ([place]) Interventions    Readmission Risk Interventions Readmission Risk Prevention Plan 03/05/2019 02/26/2019 01/26/2019  Transportation Screening Complete Complete Complete  Medication Review Press photographer) Complete Complete Complete  PCP or Specialist appointment within 3-5 days of discharge Complete Complete Complete  HRI or Home Care Consult Complete Complete Complete  SW Recovery Care/Counseling Consult Complete Not Complete Not Complete  SW Consult Not Complete Comments - NA N/A  Palliative Care Screening Not Applicable Not Applicable Not [place] Not Applicable Not Applicable Not Applicable  Some recent data might be hidden

## 2020-04-05 LAB — CBC
HCT: 30.5 % — ABNORMAL LOW (ref 39.0–52.0)
Hemoglobin: 9.9 g/dL — ABNORMAL LOW (ref 13.0–17.0)
MCH: 30.3 pg (ref 26.0–34.0)
MCHC: 32.5 g/dL (ref 30.0–36.0)
MCV: 93.3 fL (ref 80.0–100.0)
Platelets: 237 10*3/uL (ref 150–400)
RBC: 3.27 MIL/uL — ABNORMAL LOW (ref 4.22–5.81)
RDW: 14.9 % (ref 11.5–15.5)
WBC: 6.1 10*3/uL (ref 4.0–10.5)
nRBC: 0 % (ref 0.0–0.2)

## 2020-04-05 LAB — GLUCOSE, CAPILLARY
Glucose-Capillary: 343 mg/dL — ABNORMAL HIGH (ref 70–99)
Glucose-Capillary: 337 mg/dL — ABNORMAL HIGH (ref 70–99)
Glucose-Capillary: 254 mg/dL — ABNORMAL HIGH (ref 70–99)

## 2020-04-05 MED ORDER — METFORMIN HCL 500 MG PO TABS: 500.0000 mg | ORAL_TABLET | Freq: Two times a day (BID) | ORAL | 0 refills | Status: AC

## 2020-04-05 MED ORDER — INSULIN PEN NEEDLE 32G X 4 MM MISC: 1.0000 [IU] | Freq: Every day | 0 refills | Status: AC

## 2020-04-05 MED ORDER — METFORMIN HCL 500 MG PO TABS
500.0000 mg | ORAL_TABLET | Freq: Two times a day (BID) | ORAL | Status: DC
  Administered 2020-04-05: 500 mg via ORAL
  Filled 2020-04-05 (×3): qty 1

## 2020-04-05 MED ORDER — INSULIN GLARGINE 100 UNIT/ML ~~LOC~~ SOLN
45.0000 [IU] | Freq: Every day | SUBCUTANEOUS | Status: DC
  Administered 2020-04-05: 13:00:00 45 [IU] via SUBCUTANEOUS
  Filled 2020-04-05 (×2): qty 0.45

## 2020-04-05 MED ORDER — LANTUS SOLOSTAR 100 UNIT/ML ~~LOC~~ SOPN: 40.0000 [IU] | PEN_INJECTOR | Freq: Every day | SUBCUTANEOUS | 11 refills | Status: AC

## 2020-04-05 NOTE — TOC Transition Note (Signed)
Transition of Care [facility]) - CM/SW Discharge Note   Patient Details  Name: Darius Taylor MRN: [id number] Date of Birth: [birth year]  Transition of Care [name] CM/SW Contact:  [name], LCSW Phone Number: 04/05/2020, 11:11 AM   Clinical Narrative:  Met with pt who is medically cleared to transfer to [facility] today. His insurance has authorized him for this. ID [id number] ref [id number] 7/12-7/14 CM [place] will follow. Awaiting MD to complete DC paperwork and will call transport. Bedside RN to call report to [phone]. Pt hopes to do well in rehab and be home soon.   2:43 Pm MD feels pt is medically stable to transfer to [facility] today. Transport set up for 4:00 pm bedside RN aware   Final next level of care: [name] Barriers to Discharge: No Barriers Identified   Patient Goals and CMS Choice Patient states their goals for this hospitalization and ongoing recovery are:: I hope to go home but know I need to do better with my eating and now I'm on insulin CMS Medicare.gov Compare Post Acute Care list provided to:: Patient Choice offered to / list presented to : Patient  Discharge Placement PASRR number recieved: 04/01/20            [facility] at: [facility] [place] Patient to be transferred to facility by: [facility] Name of family member notified: [name] Patient and family notified of of transfer: 04/05/20  Discharge Plan and Services In-house Referral: Clinical Social Work   Post Acute Care Choice: Home Health                    HH Arranged: RN   Date [facility] Contacted: 03/31/20 Time [place]: 1329 Representative spoke with at [place]: teresa  Social Determinants of Health ([place]) Interventions     Readmission Risk Interventions Readmission Risk Prevention Plan 03/05/2019 02/26/2019 01/26/2019  Transportation Screening Complete Complete Complete  Medication Review Press photographer) Complete Complete  Complete  PCP or Specialist appointment within 3-5 days of discharge Complete Complete Complete  HRI or Home Care Consult Complete Complete Complete  SW Recovery Care/Counseling Consult Complete Not Complete Not Complete  SW Consult Not Complete Comments - NA N/A  Palliative Care Screening Not Applicable Not Applicable Not [place] Not Applicable Not Applicable Not Applicable  Some recent data might be hidden

## 2020-04-05 NOTE — Progress Notes (Signed)
Physical Therapy Treatment Patient Details Name: Darius Taylor MRN: [id number] DOB: 1946-06-10 Today's Date: 04/05/2020    History of Present Illness Darius Taylor is a 74 y/o male who was admitted for hypersmodal hyperglycemic state. Chief complaints include 1 week history of weakness, diarrhea, nausea, and poor appetite. PMH includes chronic A-fib on Eliquis and amiodoarone, diastolic heart failure, COPD, CAD, s/p TAVR, L nephrectomy secondary to renal carcinoma, colectomy secondary to colon cancer, AICD and pacemaker placement, and history of ventricular tachycardia.    PT Comments    Pt ready for session.  Bed mobility with supervision for safety.  He is able to stand and complete lap around small nursing pod.  Endorses fatigue.  Seated rest on bed and given time he is able to complete 5 x sit to stand 2 times to RW. Cues for hand placements and he continues to pull up on walker to stand.  Pt motivated to participate and is excited to go to rehab in order to return to PLOF.   Follow Up Recommendations  SNF     Equipment Recommendations  None recommended by PT    Recommendations for Other Services       Precautions / Restrictions Precautions Precautions: Fall Restrictions Weight Bearing Restrictions: No    Mobility  Bed Mobility Overal bed mobility: Needs Assistance Bed Mobility: Supine to Sit;Sit to Supine     Supine to sit: Modified independent (Device/Increase time);Supervision Sit to supine: Modified independent (Device/Increase time);Supervision      Transfers Overall transfer level: Needs assistance Equipment used: Rolling walker (2 wheeled) Transfers: Sit to/from Stand Sit to Stand: Min guard            Ambulation/Gait Ambulation/Gait assistance: Min guard Gait Distance (Feet): 90 Feet Assistive device: Rolling walker (2 wheeled) Gait Pattern/deviations: Step-through pattern;Decreased step length - right;Decreased step length - left;Trunk flexed Gait  velocity: decreased   General Gait Details: genreally steady gait, fatigued with distance   Stairs             Wheelchair Mobility    Modified Rankin (Stroke Patients Only)       Balance Overall balance assessment: Needs assistance Sitting-balance support: Feet supported Sitting balance-Leahy Scale: Good Sitting balance - Comments: no LOB noted   Standing balance support: Bilateral upper extremity supported Standing balance-Leahy Scale: Good Standing balance comment: BUE on RW with CGA for safety                            Cognition Arousal/Alertness: Awake/alert Behavior During Therapy: WFL for tasks assessed/performed Overall Cognitive Status: Within Functional Limits for tasks assessed                                 General Comments: Pt A&O x 4.      Exercises Other Exercises Other Exercises: 5 x sit to stand to walker x 2 (seated rest break to complete 10 reps)    General Comments        Pertinent Vitals/Pain Pain Assessment: No/denies pain    Home Living                      Prior Function            PT Goals (current goals can now be found in the care plan section) Progress towards PT goals: Progressing toward goals    Frequency  Min 2X/week      PT Plan Current plan remains appropriate    Co-evaluation              AM-PAC PT "6 Clicks" Mobility   Outcome Measure  Help needed turning from your back to your side while in a flat bed without using bedrails?: None Help needed moving from lying on your back to sitting on the side of a flat bed without using bedrails?: None Help needed moving to and from a bed to a chair (including a wheelchair)?: A Little Help needed standing up from a chair using your arms (e.g., wheelchair or bedside chair)?: A Little Help needed to walk in hospital room?: A Little Help needed climbing 3-5 steps with a railing? : A Little 6 Click Score: 20    End of  Session Equipment Utilized During Treatment: Gait belt;Oxygen Activity Tolerance: Patient limited by fatigue Patient left: in bed;with nursing/sitter in room;with bed alarm set Nurse Communication: Mobility status       Time: 0912-0925 PT Time Calculation (min) (ACUTE ONLY): 13 min  Charges:  $Gait Training: 8-22 mins                    Chesley Noon, PTA 04/05/20, 10:23 AM

## 2020-04-05 NOTE — Discharge Summary (Signed)
Physician Discharge Summary  Darius Taylor WUX:[id number] DOB: 1945-09-26   [facility] [place] date: 03/29/2020 Discharge date: 04/05/2020 Length of Stay: 6 days   Code Status: Full Code  Admitted From: Home Discharged to:  SNF Discharge Condition:  Stable  Recommendations for Outpatient Follow-up   1. Follow up with PCP in 1 week and needs close follow-up for new onset diabetes 2. Titrate up Metformin outpatient -started this admission 3. Has a urology appointment this coming 04/07/2020 which he needs to keep, please make sure he gets to this appointment  [place] a 74 y.o.malewith a history of atrial fibrillation on eliquis and amiodarone, HFpE, COPD, CAD, TAVR, left nephrectomy secondary to [place], colectomy secondary to colon cancer who presented with 1 week history of weakness, diarrhea and nausea with poor oral intake andwho was admittedfor HHS and newly diagnosed Diabetes and placed on an insulin drip. He has since been transitioned to subcutaneous insulin but continues to have generalized weakness and hyperglycemia. Diabetic coordinator has been consulted as well as PT.   Patient was also noted to have gross hematuria in the ED which was discussed with Urology who did not recommend any intervention at this time unless decompensation and to continue Eliquis per his Urologist recommendations in Care Everywhere for history of clots.   7/9: tachypnea with increased work of breathing and noted to be volume overloaded on exam. Noted that he had not been taking his home lasix 80 mg bid while in the hospital. He was given Lasix 40 mg IV x 2 with increased respiratory status.  Resolved with Lasix  7/13: Patient did not want to have more than 1 insulin injection per day and so he was started on Lantus 40 units daily as well as Metformin 500 mg p.o. twice daily to be uptitrated as an outpatient.   A & P   Principal Problem:   Hyperosmolar  hyperglycemic state (HHS) ([place]) Active Problems:   Chronic respiratory failure with hypoxia (HCC)   Chronic diastolic heart failure (HCC)   HTN (hypertension)   CAD (coronary artery disease)   COPD with chronic bronchitis (HCC)   History of nephrectomy secondary to RCC   Chronic kidney disease (CKD) stage G3b/A3, moderately decreased glomerular filtration rate (GFR) between 30-44 mL/min/1.73 square meter and albuminuria creatinine ratio greater than 300 mg/g   New onset type 2 diabetes mellitus ([place])    1. HHS/Newly diagnosed DM a. HA1c 12.2  b. Continue Lantus to 40 u daily and started on Metformin 500 mg twice daily c. Diabetic coordinator on board - [name] 2 sensor placed on RUE.   1. Generalized weakness, likely from debility and hyperglycemia a. PT eval -> SNF  2. Gross hematuria, resolved a. Discussed with urology, can continue eliquis. No need for intervention unless he has worsening bleeding and/or clots.  b. Has an appointment with Urology on 04/07/20 that he needs to keep   3. HFpEF exacerbation, resolved  4. Chronic respiratory failure with hypoxia, stable 1. Intermittently requiring O2   5. Atrial fibrillation, rate controlled a. Continue eliquis unless further urologic issue, per Urology b. Restarted home amiodarone and Toprol XL  6. HTN 1. Tolerating home meds  7. CAD, stable  8. COPD, stable  9. AKI on CKD 3b, resolved 1. At/near baseline  10. Headache, resolved  11. History of colon cancer s/p colectomy    Consultants   None  Procedures   None  Antibiotics  Anti-infectives (From admission, onward)   None       Subjective  Patient seen and examined at bedside no acute distress and resting comfortably.  No events overnight.  Tolerating diet. In good spirits and anticipating discharge.   Denies any chest pain, shortness of breath, fever, nausea, vomiting, urinary or bowel complaints. Otherwise ROS negative     Objective   Discharge Exam: Vitals:   04/05/20 0858 04/05/20 0920  BP: 134/82   Pulse: 69   Resp: 17   Temp: 98 F (36.7 C)   SpO2: 99% 97%   Vitals:   04/04/20 2030 04/04/20 2330 04/05/20 0858 04/05/20 0920  BP: (!) 143/48 137/67 134/82   Pulse: 62 66 69   Resp: 17 17 17    Temp: 98 F (36.7 C) 98 F (36.7 C) 98 F (36.7 C)   TempSrc:      SpO2: 93% 93% 99% 97%  Weight:      Height:        Physical Exam Vitals and nursing note reviewed.  Constitutional:      Appearance: Normal appearance.  HENT:     Head: Normocephalic and atraumatic.  Eyes:     Conjunctiva/sclera: Conjunctivae normal.  Cardiovascular:     Rate and Rhythm: Normal rate and regular rhythm.  Pulmonary:     Effort: Pulmonary effort is normal.     Breath sounds: Normal breath sounds.  Abdominal:     General: Abdomen is flat.     Palpations: Abdomen is soft.  Genitourinary:    Comments: Urine clear yellow Musculoskeletal:        General: No swelling or tenderness.  Skin:    Coloration: Skin is not jaundiced or pale.  Neurological:     Mental Status: He is alert. Mental status is at baseline.  Psychiatric:        Mood and Affect: Mood normal.        Behavior: Behavior normal.       The results of significant diagnostics from this hospitalization (including imaging, microbiology, ancillary and laboratory) are listed below for reference.     Microbiology: Recent Results (from the past 240 hour(s))  Urine culture     Status: Abnormal   Collection Time: 03/29/20 11:37 PM   Specimen: Urine, Clean Catch  Result Value Ref Range Status   Specimen Description   Final    URINE, CLEAN CATCH Performed at [facility], [address], [place], [place] [address]    Special Requests   Final    NONE Performed at [facility], [place], [place] [address]    Culture 50,000 COLONIES/mL ENTEROCOCCUS FAECALIS (A)  Final   Report Status 04/01/2020 FINAL  Final    Organism ID, Bacteria ENTEROCOCCUS FAECALIS (A)  Final      Susceptibility   Enterococcus faecalis - MIC*    AMPICILLIN <=2 SENSITIVE Sensitive     NITROFURANTOIN <=16 SENSITIVE Sensitive     VANCOMYCIN 1 SENSITIVE Sensitive     * 50,000 COLONIES/mL ENTEROCOCCUS FAECALIS  SARS Coronavirus 2 by RT PCR (hospital order, performed in [place] hospital lab) Nasopharyngeal Nasopharyngeal Swab     Status: None   Collection Time: 03/30/20  2:26 AM   Specimen: Nasopharyngeal Swab  Result Value Ref Range Status   SARS Coronavirus 2 NEGATIVE NEGATIVE Final    Comment: (NOTE) SARS-CoV-2 target nucleic acids are NOT DETECTED.  The SARS-CoV-2 RNA is generally detectable in upper and lower respiratory specimens during the  acute phase of infection. The lowest concentration of SARS-CoV-2 viral copies this assay can detect is 250 copies / mL. A negative result does not preclude SARS-CoV-2 infection and should not be used as the sole basis for treatment or other patient management decisions.  A negative result may occur with improper specimen collection / handling, submission of specimen other than nasopharyngeal swab, presence of viral mutation(s) within the areas targeted by this assay, and inadequate number of viral copies (<250 copies / mL). A negative result must be combined with clinical observations, patient history, and epidemiological information.  Fact Sheet for Patients:   StrictlyIdeas.no  Fact Sheet for Healthcare Providers: BankingDealers.co.za  This test is not yet approved or  cleared by the [place] FDA and has been authorized for detection and/or diagnosis of SARS-CoV-2 by FDA under an Emergency Use Authorization (EUA).  This EUA will remain in effect (meaning this test can be used) for the duration of the COVID-19 declaration under Section 564(b)(1) of the Act, 21 U.S.C. section 360bbb-3(b)(1), unless the authorization is  terminated or revoked sooner.  Performed at [facility], [address] [place], [place] [address]      Labs: BNP (last 3 results) No results for input(s): BNP in the last 8760 hours. Basic Metabolic Panel: Recent Labs  Lab 03/30/20 1239 03/30/20 1433 03/30/20 2229 04/01/20 1124 04/02/20 0409  NA 134* 132* 133* 134* 135  K 3.7 4.2 4.2 3.9 3.7  CL 101 96* 98 102 100  CO2 25 25 26 28 27   GLUCOSE 368* 410* 289* 299* 76  BUN 43* 45* 45* 28* 32*  CREATININE 1.63* 1.83* 1.62* 1.38* 1.40*  CALCIUM 7.6* 8.8* 8.9 8.9 9.1  MG  --   --   --   --  1.8   Liver Function Tests: No results for input(s): AST, ALT, ALKPHOS, BILITOT, PROT, ALBUMIN in the last 168 hours. No results for input(s): LIPASE, AMYLASE in the last 168 hours. No results for input(s): AMMONIA in the last 168 hours. CBC: Recent Labs  Lab 03/29/20 2201 04/02/20 0409 04/05/20 0412  WBC 7.2 6.4 6.1  HGB 11.0* 10.1* 9.9*  HCT 34.2* 30.1* 30.5*  MCV 94.5 91.8 93.3  PLT 201 197 237   Cardiac Enzymes: No results for input(s): CKTOTAL, CKMB, CKMBINDEX, TROPONINI in the last 168 hours. BNP: Invalid input(s): POCBNP CBG: Recent Labs  Lab 04/04/20 2201 04/04/20 2227 04/05/20 0901 04/05/20 1059 04/05/20 1406  GLUCAP 65* 78 343* 337* 254*   D-Dimer No results for input(s): DDIMER in the last 72 hours. Hgb A1c No results for input(s): HGBA1C in the last 72 hours. Lipid Profile No results for input(s): CHOL, HDL, LDLCALC, TRIG, CHOLHDL, LDLDIRECT in the last 72 hours. Thyroid function studies No results for input(s): TSH, T4TOTAL, T3FREE, THYROIDAB in the last 72 hours.  Invalid input(s): FREET3 Anemia work up No results for input(s): VITAMINB12, FOLATE, FERRITIN, TIBC, IRON, RETICCTPCT in the last 72 hours. Urinalysis    Component Value Date/Time   COLORURINE STRAW (A) 03/29/2020 2337   APPEARANCEUR CLEAR (A) 03/29/2020 2337   APPEARANCEUR Clear 01/19/2013 1957   LABSPEC 1.022 03/29/2020  2337   LABSPEC 1.025 01/19/2013 1957   PHURINE 6.0 03/29/2020 2337   GLUCOSEU >=500 (A) 03/29/2020 2337   GLUCOSEU Negative 01/19/2013 1957   HGBUR LARGE (A) 03/29/2020 2337   [place] NEGATIVE 03/29/2020 2337   BILIRUBINUR Negative 01/19/2013 1957   KETONESUR NEGATIVE 03/29/2020 2337   PROTEINUR NEGATIVE 03/29/2020 2337   NITRITE NEGATIVE  03/29/2020 [place] 03/29/2020 2337   LEUKOCYTESUR 2+ 01/19/2013 1957   Sepsis Labs Invalid input(s): PROCALCITONIN,  WBC,  LACTICIDVEN Microbiology Recent Results (from the past 240 hour(s))  Urine culture     Status: Abnormal   Collection Time: 03/29/20 11:37 PM   Specimen: Urine, Clean Catch  Result Value Ref Range Status   Specimen Description   Final    URINE, CLEAN CATCH Performed at [facility], [address], [place], [place] [address]    Special Requests   Final    NONE Performed at [facility], [place], [place] [address]    Culture 50,000 COLONIES/mL ENTEROCOCCUS FAECALIS (A)  Final   Report Status 04/01/2020 FINAL  Final   Organism ID, Bacteria ENTEROCOCCUS FAECALIS (A)  Final      Susceptibility   Enterococcus faecalis - MIC*    AMPICILLIN <=2 SENSITIVE Sensitive     NITROFURANTOIN <=16 SENSITIVE Sensitive     VANCOMYCIN 1 SENSITIVE Sensitive     * 50,000 COLONIES/mL ENTEROCOCCUS FAECALIS  SARS Coronavirus 2 by RT PCR (hospital order, performed in [place] hospital lab) Nasopharyngeal Nasopharyngeal Swab     Status: None   Collection Time: 03/30/20  2:26 AM   Specimen: Nasopharyngeal Swab  Result Value Ref Range Status   SARS Coronavirus 2 NEGATIVE NEGATIVE Final    Comment: (NOTE) SARS-CoV-2 target nucleic acids are NOT DETECTED.  The SARS-CoV-2 RNA is generally detectable in upper and lower respiratory specimens during the acute phase of infection. The lowest concentration of SARS-CoV-2 viral copies this assay can detect is 250 copies / mL. A negative  result does not preclude SARS-CoV-2 infection and should not be used as the sole basis for treatment or other patient management decisions.  A negative result may occur with improper specimen collection / handling, submission of specimen other than nasopharyngeal swab, presence of viral mutation(s) within the areas targeted by this assay, and inadequate number of viral copies (<250 copies / mL). A negative result must be combined with clinical observations, patient history, and epidemiological information.  Fact Sheet for Patients:   StrictlyIdeas.no  Fact Sheet for Healthcare Providers: BankingDealers.co.za  This test is not yet approved or  cleared by the [place] FDA and has been authorized for detection and/or diagnosis of SARS-CoV-2 by FDA under an Emergency Use Authorization (EUA).  This EUA will remain in effect (meaning this test can be used) for the duration of the COVID-19 declaration under Section 564(b)(1) of the Act, 21 U.S.C. section 360bbb-3(b)(1), unless the authorization is terminated or revoked sooner.  Performed at [facility], [address], [place], [place] [address]     Discharge Instructions     Discharge Instructions    Diet - low sodium heart healthy   Complete by: As directed    Discharge instructions   Complete by: As directed    - Start Lantus 40 units daily and metformin 500 mg twice daily for your newly diagnosed diabetes - Make an appointment with your primary care physician in the next week. - record your blood sugars daily and bring this log to your next primary care physician appointment - refer to the After Visit Summary for more information regarding diabetes, lifestyle modification and diet - Follow up with your Urologist for your already scheduled appointment this week. If you have worsening blood in your urine or you notice blood clots, then notify your Urologist  immediately If you have any significant  change or worsening of your symptoms, do not hesitate to contact your primary care physician or return to the ED.   Increase activity slowly   Complete by: As directed      Allergies as of 04/05/2020   No Known Allergies     Medication List    TAKE these medications   acetaminophen 325 MG tablet Commonly known as: TYLENOL Take 325-650 mg by mouth every 6 (six) hours as needed for mild pain or moderate pain.   allopurinol 100 MG tablet Commonly known as: ZYLOPRIM Take 100 mg by mouth daily.   amiodarone 200 MG tablet Commonly known as: PACERONE Take 200 mg by mouth daily.   amLODipine 10 MG tablet Commonly known as: NORVASC Take 10 mg by mouth daily.   atorvastatin 80 MG tablet Commonly known as: LIPITOR Take 80 mg by mouth at bedtime.   cholecalciferol 1000 units tablet Commonly known as: VITAMIN D Take 1,000 Units by mouth daily.   colchicine 0.6 MG tablet Take 0.6 mg by mouth daily.   Mitigare 0.6 MG Caps Generic drug: Colchicine Take 1 capsule by mouth daily.   cyanocobalamin 1000 MCG tablet Take 1 tablet (1,000 mcg total) by mouth daily.   Eliquis 5 MG Tabs tablet Generic drug: apixaban Take 5 mg by mouth 2 (two) times daily.   furosemide 80 MG tablet Commonly known as: LASIX Take 80 mg by mouth 2 (two) times daily.   Insulin Pen Needle 32G X 4 MM Misc 1 Units by Does not apply route daily.   Lantus SoloStar 100 UNIT/ML Solostar Pen Generic drug: insulin glargine Inject 40 Units into the skin daily.   magnesium oxide 400 MG tablet Commonly known as: MAG-OX Take 1 tablet by mouth daily.   metFORMIN 500 MG tablet Commonly known as: GLUCOPHAGE Take 1 tablet (500 mg total) by mouth 2 (two) times daily with a meal.   metoprolol 200 MG 24 hr tablet Commonly known as: TOPROL-XL Take 200 mg by mouth daily.   pantoprazole 40 MG tablet Commonly known as: PROTONIX Take 40 mg by mouth daily before breakfast.        No Known Allergies  Dispo: The patient is from: Home              Anticipated d/c is to: SNF               Anticipated d/c date is: Today              Patient currently is medically stable to d/c.       Time coordinating discharge: Over 30 minutes   SIGNED:   Harold Hedge, D.O. [facility] Pager: [phone]  04/05/2020, 2:38 PM

## 2020-04-05 NOTE — Progress Notes (Signed)
Pt left at this time to [facility] to [facility]. A/o no distress. [facility] called [facility] at [facility].  Sl x2 d/cd.  Papers in packet for pt.

## 2020-04-05 NOTE — Plan of Care (Signed)
  Problem: Education: Goal: Ability to describe self-care measures that may prevent or decrease complications (Diabetes Survival Skills Education) will improve Outcome: Progressing   Problem: Fluid Volume: Goal: Ability to maintain a balanced intake and output will improve Outcome: Progressing   Problem: Health Behavior/Discharge Planning: Goal: Ability to manage health-related needs will improve Outcome: Progressing   Problem: Health Behavior/Discharge Planning: Goal: Ability to identify and utilize available resources and services will improve Outcome: Progressing Goal: Ability to manage health-related needs will improve Outcome: Progressing   Problem: Metabolic: Goal: Ability to maintain appropriate glucose levels will improve Outcome: Progressing

## 2020-07-14 ENCOUNTER — Ambulatory Visit: Payer: Medicare HMO | Admitting: Family

## 2020-07-25 DEATH — deceased
